# Patient Record
Sex: Female | Born: 1940 | Race: White | Hispanic: No | Marital: Married | Smoking: Former smoker
Health system: Southern US, Community
[De-identification: ages and names within clinical notes are randomized; demographics above are authoritative.]

## PROBLEM LIST (undated history)

## (undated) DIAGNOSIS — I208 Other forms of angina pectoris: Secondary | ICD-10-CM

## (undated) DIAGNOSIS — N183 Chronic kidney disease, stage 3 unspecified: Secondary | ICD-10-CM

## (undated) DIAGNOSIS — E039 Hypothyroidism, unspecified: Secondary | ICD-10-CM

## (undated) DIAGNOSIS — K219 Gastro-esophageal reflux disease without esophagitis: Secondary | ICD-10-CM

## (undated) DIAGNOSIS — I5032 Chronic diastolic (congestive) heart failure: Secondary | ICD-10-CM

## (undated) DIAGNOSIS — G4733 Obstructive sleep apnea (adult) (pediatric): Secondary | ICD-10-CM

## (undated) DIAGNOSIS — G459 Transient cerebral ischemic attack, unspecified: Secondary | ICD-10-CM

## (undated) DIAGNOSIS — E669 Obesity, unspecified: Secondary | ICD-10-CM

## (undated) DIAGNOSIS — I2510 Atherosclerotic heart disease of native coronary artery without angina pectoris: Secondary | ICD-10-CM

## (undated) DIAGNOSIS — M353 Polymyalgia rheumatica: Secondary | ICD-10-CM

## (undated) DIAGNOSIS — I771 Stricture of artery: Secondary | ICD-10-CM

## (undated) DIAGNOSIS — M1990 Unspecified osteoarthritis, unspecified site: Secondary | ICD-10-CM

## (undated) DIAGNOSIS — D649 Anemia, unspecified: Secondary | ICD-10-CM

## (undated) DIAGNOSIS — Z9989 Dependence on other enabling machines and devices: Secondary | ICD-10-CM

## (undated) DIAGNOSIS — Z8719 Personal history of other diseases of the digestive system: Secondary | ICD-10-CM

## (undated) DIAGNOSIS — I2089 Other forms of angina pectoris: Secondary | ICD-10-CM

## (undated) DIAGNOSIS — J189 Pneumonia, unspecified organism: Secondary | ICD-10-CM

## (undated) DIAGNOSIS — E785 Hyperlipidemia, unspecified: Secondary | ICD-10-CM

## (undated) DIAGNOSIS — F329 Major depressive disorder, single episode, unspecified: Secondary | ICD-10-CM

## (undated) DIAGNOSIS — F32A Depression, unspecified: Secondary | ICD-10-CM

## (undated) HISTORY — DX: Transient cerebral ischemic attack, unspecified: G45.9

## (undated) HISTORY — DX: Chronic kidney disease, stage 3 (moderate): N18.3

## (undated) HISTORY — DX: Hyperlipidemia, unspecified: E78.5

## (undated) HISTORY — DX: Major depressive disorder, single episode, unspecified: F32.9

## (undated) HISTORY — PX: DILATION AND CURETTAGE OF UTERUS: SHX78

## (undated) HISTORY — DX: Depression, unspecified: F32.A

## (undated) HISTORY — DX: Chronic kidney disease, stage 3 unspecified: N18.30

## (undated) HISTORY — DX: Chronic diastolic (congestive) heart failure: I50.32

## (undated) HISTORY — DX: Other forms of angina pectoris: I20.8

## (undated) HISTORY — DX: Other forms of angina pectoris: I20.89

## (undated) HISTORY — DX: Dependence on other enabling machines and devices: Z99.89

## (undated) HISTORY — DX: Obesity, unspecified: E66.9

## (undated) HISTORY — PX: FRACTURE SURGERY: SHX138

## (undated) HISTORY — DX: Atherosclerotic heart disease of native coronary artery without angina pectoris: I25.10

## (undated) HISTORY — DX: Obstructive sleep apnea (adult) (pediatric): G47.33

## (undated) HISTORY — DX: Stricture of artery: I77.1

---

## 1962-03-23 HISTORY — PX: BREAST BIOPSY: SHX20

## 1991-03-24 HISTORY — PX: PATELLA FRACTURE SURGERY: SHX735

## 1998-01-29 ENCOUNTER — Encounter: Payer: Self-pay | Admitting: Family Medicine

## 1998-01-29 ENCOUNTER — Ambulatory Visit (HOSPITAL_COMMUNITY): Admission: RE | Admit: 1998-01-29 | Discharge: 1998-01-29 | Payer: Self-pay | Admitting: Family Medicine

## 1998-02-28 ENCOUNTER — Ambulatory Visit (HOSPITAL_COMMUNITY): Admission: RE | Admit: 1998-02-28 | Discharge: 1998-03-01 | Payer: Self-pay | Admitting: Cardiology

## 1998-03-23 HISTORY — PX: CORONARY ARTERY BYPASS GRAFT: SHX141

## 1998-03-26 ENCOUNTER — Encounter (HOSPITAL_COMMUNITY): Admission: RE | Admit: 1998-03-26 | Discharge: 1998-06-24 | Payer: Self-pay | Admitting: Cardiology

## 1998-03-29 ENCOUNTER — Encounter: Payer: Self-pay | Admitting: Emergency Medicine

## 1998-03-29 ENCOUNTER — Emergency Department (HOSPITAL_COMMUNITY): Admission: EM | Admit: 1998-03-29 | Discharge: 1998-03-29 | Payer: Self-pay | Admitting: Emergency Medicine

## 1998-05-14 ENCOUNTER — Ambulatory Visit: Admission: RE | Admit: 1998-05-14 | Discharge: 1998-05-14 | Payer: Self-pay | Admitting: Family Medicine

## 1998-05-22 ENCOUNTER — Encounter: Payer: Self-pay | Admitting: Cardiology

## 1998-05-22 ENCOUNTER — Inpatient Hospital Stay (HOSPITAL_COMMUNITY): Admission: EM | Admit: 1998-05-22 | Discharge: 1998-05-25 | Payer: Self-pay | Admitting: Emergency Medicine

## 1998-06-06 ENCOUNTER — Inpatient Hospital Stay (HOSPITAL_COMMUNITY): Admission: EM | Admit: 1998-06-06 | Discharge: 1998-06-08 | Payer: Self-pay | Admitting: Emergency Medicine

## 1998-06-06 ENCOUNTER — Encounter: Payer: Self-pay | Admitting: Emergency Medicine

## 1998-06-07 ENCOUNTER — Encounter: Payer: Self-pay | Admitting: Cardiology

## 1998-06-24 ENCOUNTER — Encounter: Payer: Self-pay | Admitting: Cardiothoracic Surgery

## 1998-06-26 ENCOUNTER — Inpatient Hospital Stay (HOSPITAL_COMMUNITY): Admission: RE | Admit: 1998-06-26 | Discharge: 1998-07-03 | Payer: Self-pay | Admitting: Cardiothoracic Surgery

## 1998-06-26 ENCOUNTER — Encounter: Payer: Self-pay | Admitting: Cardiothoracic Surgery

## 1998-06-27 ENCOUNTER — Encounter: Payer: Self-pay | Admitting: Cardiothoracic Surgery

## 1998-06-28 ENCOUNTER — Encounter: Payer: Self-pay | Admitting: Cardiology

## 1998-06-29 ENCOUNTER — Encounter: Payer: Self-pay | Admitting: Cardiothoracic Surgery

## 1998-06-30 ENCOUNTER — Encounter: Payer: Self-pay | Admitting: Cardiothoracic Surgery

## 1998-07-01 ENCOUNTER — Encounter: Payer: Self-pay | Admitting: Cardiothoracic Surgery

## 1998-07-23 ENCOUNTER — Encounter (HOSPITAL_COMMUNITY): Admission: RE | Admit: 1998-07-23 | Discharge: 1998-10-21 | Payer: Self-pay | Admitting: Cardiology

## 1998-09-05 ENCOUNTER — Observation Stay (HOSPITAL_COMMUNITY): Admission: AD | Admit: 1998-09-05 | Discharge: 1998-09-06 | Payer: Self-pay | Admitting: Cardiology

## 1998-12-28 ENCOUNTER — Encounter: Payer: Self-pay | Admitting: Emergency Medicine

## 1998-12-28 ENCOUNTER — Inpatient Hospital Stay (HOSPITAL_COMMUNITY): Admission: EM | Admit: 1998-12-28 | Discharge: 1998-12-31 | Payer: Self-pay | Admitting: Emergency Medicine

## 1998-12-30 ENCOUNTER — Encounter: Payer: Self-pay | Admitting: Cardiology

## 1999-03-05 ENCOUNTER — Other Ambulatory Visit: Admission: RE | Admit: 1999-03-05 | Discharge: 1999-03-05 | Payer: Self-pay | Admitting: Obstetrics and Gynecology

## 1999-05-29 ENCOUNTER — Encounter: Payer: Self-pay | Admitting: Emergency Medicine

## 1999-05-29 ENCOUNTER — Observation Stay (HOSPITAL_COMMUNITY): Admission: EM | Admit: 1999-05-29 | Discharge: 1999-05-30 | Payer: Self-pay | Admitting: Emergency Medicine

## 1999-06-17 ENCOUNTER — Inpatient Hospital Stay (HOSPITAL_COMMUNITY): Admission: AD | Admit: 1999-06-17 | Discharge: 1999-06-20 | Payer: Self-pay | Admitting: Cardiology

## 1999-06-17 ENCOUNTER — Encounter: Payer: Self-pay | Admitting: Cardiology

## 1999-12-11 ENCOUNTER — Encounter: Payer: Self-pay | Admitting: Emergency Medicine

## 1999-12-11 ENCOUNTER — Inpatient Hospital Stay (HOSPITAL_COMMUNITY): Admission: EM | Admit: 1999-12-11 | Discharge: 1999-12-12 | Payer: Self-pay | Admitting: Emergency Medicine

## 2000-03-09 ENCOUNTER — Other Ambulatory Visit: Admission: RE | Admit: 2000-03-09 | Discharge: 2000-03-09 | Payer: Self-pay | Admitting: Obstetrics and Gynecology

## 2000-08-27 ENCOUNTER — Other Ambulatory Visit: Admission: RE | Admit: 2000-08-27 | Discharge: 2000-08-27 | Payer: Self-pay | Admitting: Obstetrics and Gynecology

## 2000-08-27 ENCOUNTER — Encounter (INDEPENDENT_AMBULATORY_CARE_PROVIDER_SITE_OTHER): Payer: Self-pay | Admitting: Specialist

## 2001-03-25 ENCOUNTER — Other Ambulatory Visit: Admission: RE | Admit: 2001-03-25 | Discharge: 2001-03-25 | Payer: Self-pay | Admitting: Obstetrics and Gynecology

## 2001-07-18 ENCOUNTER — Encounter: Payer: Self-pay | Admitting: Obstetrics and Gynecology

## 2001-07-18 ENCOUNTER — Encounter: Admission: RE | Admit: 2001-07-18 | Discharge: 2001-07-18 | Payer: Self-pay | Admitting: Obstetrics and Gynecology

## 2001-09-29 ENCOUNTER — Inpatient Hospital Stay (HOSPITAL_COMMUNITY): Admission: EM | Admit: 2001-09-29 | Discharge: 2001-09-30 | Payer: Self-pay | Admitting: Emergency Medicine

## 2001-09-29 ENCOUNTER — Encounter: Payer: Self-pay | Admitting: Emergency Medicine

## 2002-01-06 ENCOUNTER — Encounter: Payer: Self-pay | Admitting: Emergency Medicine

## 2002-01-06 ENCOUNTER — Emergency Department (HOSPITAL_COMMUNITY): Admission: EM | Admit: 2002-01-06 | Discharge: 2002-01-06 | Payer: Self-pay | Admitting: Emergency Medicine

## 2002-05-05 ENCOUNTER — Other Ambulatory Visit: Admission: RE | Admit: 2002-05-05 | Discharge: 2002-05-05 | Payer: Self-pay | Admitting: Obstetrics and Gynecology

## 2002-06-20 ENCOUNTER — Inpatient Hospital Stay (HOSPITAL_COMMUNITY): Admission: EM | Admit: 2002-06-20 | Discharge: 2002-06-22 | Payer: Self-pay | Admitting: Emergency Medicine

## 2002-06-20 ENCOUNTER — Encounter: Payer: Self-pay | Admitting: Family Medicine

## 2002-11-07 ENCOUNTER — Emergency Department (HOSPITAL_COMMUNITY): Admission: EM | Admit: 2002-11-07 | Discharge: 2002-11-08 | Payer: Self-pay | Admitting: Emergency Medicine

## 2002-11-08 ENCOUNTER — Encounter: Payer: Self-pay | Admitting: Emergency Medicine

## 2003-03-13 ENCOUNTER — Encounter (HOSPITAL_COMMUNITY): Admission: RE | Admit: 2003-03-13 | Discharge: 2003-06-11 | Payer: Self-pay | Admitting: Cardiology

## 2003-05-21 ENCOUNTER — Other Ambulatory Visit: Admission: RE | Admit: 2003-05-21 | Discharge: 2003-05-21 | Payer: Self-pay | Admitting: Obstetrics and Gynecology

## 2003-05-29 ENCOUNTER — Encounter: Admission: RE | Admit: 2003-05-29 | Discharge: 2003-08-27 | Payer: Self-pay | Admitting: Family Medicine

## 2003-06-08 ENCOUNTER — Encounter: Admission: RE | Admit: 2003-06-08 | Discharge: 2003-06-08 | Payer: Self-pay | Admitting: Obstetrics and Gynecology

## 2003-06-23 ENCOUNTER — Encounter (HOSPITAL_COMMUNITY): Admission: RE | Admit: 2003-06-23 | Discharge: 2003-09-21 | Payer: Self-pay | Admitting: Cardiology

## 2003-09-25 ENCOUNTER — Encounter (HOSPITAL_COMMUNITY): Admission: RE | Admit: 2003-09-25 | Discharge: 2003-12-24 | Payer: Self-pay | Admitting: Cardiology

## 2005-06-10 ENCOUNTER — Inpatient Hospital Stay (HOSPITAL_COMMUNITY): Admission: RE | Admit: 2005-06-10 | Discharge: 2005-06-13 | Payer: Self-pay | Admitting: Internal Medicine

## 2006-03-23 HISTORY — PX: CARDIAC CATHETERIZATION: SHX172

## 2006-04-09 ENCOUNTER — Inpatient Hospital Stay (HOSPITAL_COMMUNITY): Admission: EM | Admit: 2006-04-09 | Discharge: 2006-04-11 | Payer: Self-pay | Admitting: Emergency Medicine

## 2009-03-23 HISTORY — PX: CATARACT EXTRACTION W/ INTRAOCULAR LENS  IMPLANT, BILATERAL: SHX1307

## 2009-08-02 ENCOUNTER — Ambulatory Visit: Payer: Self-pay | Admitting: Internal Medicine

## 2009-08-06 LAB — CBC WITH DIFFERENTIAL/PLATELET
BASO%: 0.2 % (ref 0.0–2.0)
Basophils Absolute: 0 10*3/uL (ref 0.0–0.1)
EOS%: 3.5 % (ref 0.0–7.0)
Eosinophils Absolute: 0.4 10*3/uL (ref 0.0–0.5)
HCT: 35.8 % (ref 34.8–46.6)
HGB: 12.3 g/dL (ref 11.6–15.9)
LYMPH%: 21.7 % (ref 14.0–49.7)
MCH: 32.2 pg (ref 25.1–34.0)
MCHC: 34.5 g/dL (ref 31.5–36.0)
MCV: 93.3 fL (ref 79.5–101.0)
MONO#: 0.5 10*3/uL (ref 0.1–0.9)
MONO%: 4.4 % (ref 0.0–14.0)
NEUT#: 8 10*3/uL — ABNORMAL HIGH (ref 1.5–6.5)
NEUT%: 70.2 % (ref 38.4–76.8)
Platelets: 323 10*3/uL (ref 145–400)
RBC: 3.84 10*6/uL (ref 3.70–5.45)
RDW: 13.2 % (ref 11.2–14.5)
WBC: 11.3 10*3/uL — ABNORMAL HIGH (ref 3.9–10.3)
lymph#: 2.5 10*3/uL (ref 0.9–3.3)

## 2009-08-09 LAB — COMPREHENSIVE METABOLIC PANEL
ALT: 14 U/L (ref 0–35)
AST: 17 U/L (ref 0–37)
Albumin: 4.2 g/dL (ref 3.5–5.2)
Alkaline Phosphatase: 75 U/L (ref 39–117)
BUN: 19 mg/dL (ref 6–23)
CO2: 23 mEq/L (ref 19–32)
Calcium: 9.4 mg/dL (ref 8.4–10.5)
Chloride: 104 mEq/L (ref 96–112)
Creatinine, Ser: 0.96 mg/dL (ref 0.40–1.20)
Glucose, Bld: 123 mg/dL — ABNORMAL HIGH (ref 70–99)
Potassium: 4 mEq/L (ref 3.5–5.3)
Sodium: 140 mEq/L (ref 135–145)
Total Bilirubin: 0.4 mg/dL (ref 0.3–1.2)
Total Protein: 7.4 g/dL (ref 6.0–8.3)

## 2009-08-09 LAB — LACTATE DEHYDROGENASE: LDH: 128 U/L (ref 94–250)

## 2009-08-09 LAB — LEUKOCYTE ALKALINE PHOS: Leukocyte Alkaline  Phos Stain: 188 — ABNORMAL HIGH (ref 33–149)

## 2009-10-03 ENCOUNTER — Ambulatory Visit: Payer: Self-pay | Admitting: Internal Medicine

## 2009-10-07 LAB — CBC WITH DIFFERENTIAL/PLATELET
BASO%: 0.3 % (ref 0.0–2.0)
Basophils Absolute: 0 10*3/uL (ref 0.0–0.1)
EOS%: 2.4 % (ref 0.0–7.0)
Eosinophils Absolute: 0.3 10*3/uL (ref 0.0–0.5)
HCT: 33.4 % — ABNORMAL LOW (ref 34.8–46.6)
HGB: 11.6 g/dL (ref 11.6–15.9)
LYMPH%: 21.6 % (ref 14.0–49.7)
MCH: 32 pg (ref 25.1–34.0)
MCHC: 34.6 g/dL (ref 31.5–36.0)
MCV: 92.5 fL (ref 79.5–101.0)
MONO#: 0.7 10*3/uL (ref 0.1–0.9)
MONO%: 5.7 % (ref 0.0–14.0)
NEUT#: 8.4 10*3/uL — ABNORMAL HIGH (ref 1.5–6.5)
NEUT%: 70 % (ref 38.4–76.8)
Platelets: 326 10*3/uL (ref 145–400)
RBC: 3.61 10*6/uL — ABNORMAL LOW (ref 3.70–5.45)
RDW: 13.1 % (ref 11.2–14.5)
WBC: 12 10*3/uL — ABNORMAL HIGH (ref 3.9–10.3)
lymph#: 2.6 10*3/uL (ref 0.9–3.3)

## 2009-10-07 LAB — LACTATE DEHYDROGENASE: LDH: 119 U/L (ref 94–250)

## 2009-11-13 ENCOUNTER — Encounter: Admission: RE | Admit: 2009-11-13 | Discharge: 2009-11-13 | Payer: Self-pay | Admitting: Obstetrics and Gynecology

## 2010-01-06 ENCOUNTER — Ambulatory Visit: Payer: Self-pay | Admitting: Internal Medicine

## 2010-01-08 LAB — CBC WITH DIFFERENTIAL/PLATELET
BASO%: 0.3 % (ref 0.0–2.0)
Basophils Absolute: 0 10*3/uL (ref 0.0–0.1)
EOS%: 2.5 % (ref 0.0–7.0)
Eosinophils Absolute: 0.3 10*3/uL (ref 0.0–0.5)
HCT: 34.1 % — ABNORMAL LOW (ref 34.8–46.6)
HGB: 11.8 g/dL (ref 11.6–15.9)
LYMPH%: 20.7 % (ref 14.0–49.7)
MCH: 31.8 pg (ref 25.1–34.0)
MCHC: 34.7 g/dL (ref 31.5–36.0)
MCV: 91.6 fL (ref 79.5–101.0)
MONO#: 0.7 10*3/uL (ref 0.1–0.9)
MONO%: 6.5 % (ref 0.0–14.0)
NEUT#: 7.3 10*3/uL — ABNORMAL HIGH (ref 1.5–6.5)
NEUT%: 70 % (ref 38.4–76.8)
Platelets: 314 10*3/uL (ref 145–400)
RBC: 3.73 10*6/uL (ref 3.70–5.45)
RDW: 13.5 % (ref 11.2–14.5)
WBC: 10.4 10*3/uL — ABNORMAL HIGH (ref 3.9–10.3)
lymph#: 2.1 10*3/uL (ref 0.9–3.3)

## 2010-01-08 LAB — LACTATE DEHYDROGENASE: LDH: 114 U/L (ref 94–250)

## 2010-08-08 NOTE — Discharge Summary (Signed)
NAMEGENECIS, VELEY              ACCOUNT NO.:  [id number]   MEDICAL RECORD NO.:  [id number]          PATIENT TYPE:  INP   LOCATION:  3711                         FACILITY:  [facility]   PHYSICIAN:  Corky Crafts, MDDATE OF BIRTH:  Jul 25, 1940   DATE OF ADMISSION:  04/09/2006  DATE OF DISCHARGE:  04/11/2006                               DISCHARGE SUMMARY   DISCHARGE DIAGNOSES:  1. Chest pain, resolved.  2. Known coronary artery disease with Cardiolite being performed on      April 11, 2006 showing ejection fraction of 73% with no inducible      left ventricular ischemia or scarring.  3. Fever.  4. Hyperlipidemia.  5. Seventy percent left subclavian stenosis.  6. Hiatal hernia.  7. Gastroesophageal reflux disease.  8. Depression.  9. Long-term medication use.  10.Family history of coronary artery disease.   Ms. Fickling is a 70 year old female with a known history of coronary  artery disease.  She underwent bypass grafting in 2000 with LIMA  to the  LAD and diagonal, saphenous vein grafts to the OM1, saphenous vein graft  to the PDA.  She has had multiple PTCA's to the second diagonal as well  as two EECP cycles.  She admits to chronic angina, but as of the last  month she has noticed increased chest pain with Tai Chi.  This is  associated with fatigue and dyspnea.  She was admitted to the hospital,  and laboratory study showed a white count of 11.8, hemoglobin 11.5, and  hematocrit 33.6, platelets 303; sodium 136, potassium 4.5.  Cardiac  isoenzymes were negative.  Total cholesterol 122, triglycerides 208, LDL  41, HDL 39, TSH 4.272.   She remained in the hospital until May 12, 2006 and underwent a  Cardiolite study that was normal as stated above.  We started Ranexa,  and she seemed to feel better.  She was then discharged to home in  stable condition on the following medications:  1. Imdur 120 mg a day.  2. Ranexa 500 mg twice a day.  3. Enteric-coated aspirin 325  mg a day.  4. Toprol XL 100 mg a day.  5. Vytorin 10/40 one tablet daily.  6. Sublingual nitroglycerin p.r.n. chest pain.  7. Allegra daily as needed.  8. Ditropan 10 mg daily.  9. Effexor 150 mg a day.  10.Flonase p.r.n.  11.Diprolene daily.  12.Fluocinonide daily.  13.Multivitamin daily.  14.Fish oil daily.  15.Mobic daily.  16.Calcium daily.  17.Coenzyme Q10 daily.  18.Pepcid.   Followup with Dr. Amil Amen in a few weeks.  She is to call for this  appointment.  Remain on a low-sodium, heart-healthy diet.  Increase  activity slowly.      Guy Franco, P.A.      Corky Crafts, MD  Electronically Signed    LB/MEDQ  D:  05/17/2006  T:  05/17/2006  Job:  [id number]   cc:   Chales Salmon. Abigail Miyamoto, M.D.  Francisca December, M.D.

## 2010-08-08 NOTE — Cardiovascular Report (Signed)
NAME:  Erin Ingram, Erin Ingram                        ACCOUNT NO.:  [id number]   MEDICAL RECORD NO.:  [id number]                   PATIENT TYPE:  INP   LOCATION:  6532                                 FACILITY:  [facility]   PHYSICIAN:  Lyn Records III, M.D.            DATE OF BIRTH:  October 19, 1940   DATE OF PROCEDURE:  06/21/2002  DATE OF DISCHARGE:                              CARDIAC CATHETERIZATION   REASON FOR STUDY:  Prolonged and recurrent episodes of dyspnea on exertion  and mild chest discomfort in this patient with a history of coronary artery  disease who has undergone coronary artery bypass grafting in 2000, and prior  to that, multiple PCI procedures by Dr. Ty Hilts.   PROCEDURE PERFORMED:  1. Left heart catheterization.  2. Selective coronary angiogram.  3. Left ventriculography.  4. Bypass graft angiogram.   DESCRIPTION OF PROCEDURE:  After informed consent, a 6-French sheath was  placed in the right femoral artery using the modified Seldinger technique.  A 6-French AT multipurpose catheter was used for hemodynamic recordings.  Left ventriculography by hand injection, selective left, right and saphenous  vein graft angiography.  The patient tolerated the procedure without  complications.   A total of 105 cc of contrast was used.   RESULTS:   I. HEMODYNAMIC DATA:  A.  Aortic pressure 108/57.  B.  Left ventricular pressure 108/16.   II. LEFT VENTRICULOGRAPHY:  Overall normal function, EF of 60%.   III. CORONARY ANGIOGRAPHY:  A.  Left main coronary artery:  Patent.  B.  Left anterior descending coronary artery:  The LAD is widely patent, but  in the mid and distal vessel, there is segmental high-grade stenosis of up  to 70%.  There is competition with the free LIMA to the distal LAD for  antegrade flow.  A large first diagonal arises and is free of significant  obstruction.  The second diagonal is totally occluded.  C.  Circumflex artery:  The circumflex contains proximal/  mid stents.  There  is severe, diffuse stenosis within this region, both in-stent and pre and  post stent margins.  Despite this, there is antegrade flow into the first  and second obtuse marginal branches, and there is competition for flow with  the saphenous vein graft that supplies the first obtuse marginal.  The  connection between the first marginal and second marginal is free of any  significant obstructive lesions.  D.  Right coronary:  The right coronary artery is diffusely diseased in the  proximal and mid segment, but patent and competes with flow, with a patent  saphenous vein graft to the PDA.   BYPASS GRAFT ANGIOGRAPHY:  A.  Saphenous vein graft to the right coronary:  Luminal irregularities but widely patent into the PDA with antegrade and  retrograde reflux throughout the entire right coronary system.  B.  Saphenous vein graft to the circumflex obtuse marginal #1:  Widely  patent with antegrade and retrograde flow into the entire circumflex system  with reflux of contrast into the LAD and left main.  C.  Saphenous vein/ free LIMA Y graft to the diagonal and LAD:  This graft  is widely patent.  The LIMA Y graft to the distal LAD is somewhat atretic  appearing, but there is free brisk flow into the distal LAD.   CONCLUSIONS:  1. Patent saphenous vein grafts and free  left internal mammary artery  graft as outlined above.  1. Severe native vessel coronary disease with diffuse segmental mid left     anterior descending, diffuse mid right coronary artery and high-grade     diffuse proximal and mid circumflex disease.  2. Normal left ventricular function.  3. Unchanged from anatomy of catheterization 06/19/99.   RECOMMENDATIONS:  1. Consider other cause for the patient's chest pain.  2. Consider CT scan.  3. Check BMET in the a.m. as the patient was mildly pre-renal prior to this     procedure, but has received some IV fluid.                                                Lesleigh Noe, M.D.    HWS/MEDQ  D:  06/21/2002  T:  06/21/2002  Job:  [id number]   cc:   Chales Salmon. Abigail Miyamoto, M.D.  [address]  [place]  [place] [address]  Fax: [phone]

## 2010-08-08 NOTE — H&P (Signed)
Erin Ingram, Erin Ingram              ACCOUNT NO.:  [id number]   MEDICAL RECORD NO.:  [id number]          PATIENT TYPE:  INP   LOCATION:  6732                         FACILITY:  [facility]   PHYSICIAN:  Kela Millin, M.D.DATE OF BIRTH:  02-04-1941   DATE OF ADMISSION:  06/10/2005  DATE OF DISCHARGE:                                HISTORY & PHYSICAL   PRIMARY CARE PHYSICIAN:  Dr. Abigail Miyamoto   CHIEF COMPLAINT:  Worsening shortness of breath.   HISTORY OF PRESENT ILLNESS:  The patient is a 70 year old white female with  past medical history significant for coronary artery disease, GERD,  dyslipidemia, depression, who presents with complaints of worsening  shortness of breath x3 days.  She states that she was in her usual state of health until about a week ago  when she started having URI symptoms - nasal congestion, post nasal drip,  and she finally went to the clinic about 3 days ago at which she was  diagnosed with sinusitis and started on Augmentin. After starting on  Augmentin she states that she began feeling short of breath and that  gradually worsened. She also developed a nonproductive cough. She denies  fevers, paroxysmal nocturnal dyspnea, orthopnea, chest pain, dysuria, leg  swelling, diarrhea, hematochezia, nausea, vomiting, melena, and no  hematemesis. Because her shortness of breath was worsening she went to her  primary care physician's office today at which time a chest x-ray was done  and she was told that she had a pneumonia and directly admitted to the [facility] service for further evaluation and management.   PAST MEDICAL HISTORY:  1.  As stated above.  2.  Depression.   MEDICATIONS:  1.  Imdur 120 mg q.a.m.  2.  Toprol XL 100 mg q.h.s.  3.  Ecotrin 325 mg q.h.s.  4.  Demadex 5 mg p.o. daily.  5.  Vytorin 10/40 mg p.o. q.h.s.  6.  Nitroglycerin p.r.n.  7.  Allegra 180 mg daily.  8.  Ditropan XL 15 mg q.h.s.  9.  Effexor 150 mg q.h.s.  10. Aciphex 20  mg daily.  11. Flonase 2 sprays q.a.m.  12. Actonel 35 mg once a week.   ALLERGIES/INTOLERANCES:  SEPTRA, DEMEROL, IMIPRAMINE, TEQUIN, CODEINE,  SUDAFED, ZEBETA.   SOCIAL HISTORY:  She quit tobacco 36 years ago, occasional alcohol.   FAMILY HISTORY:  Positive for heart disease.   REVIEW OF SYSTEMS:  As per HPI. Other review of systems negative.   PHYSICAL EXAMINATION:  GENERAL:  The patient is an elderly white female,  obese, in no respiratory distress with nasal cannula O2 on.  VITAL SIGNS:  Temperature is 98.2 with a pulse of 102, respiratory rate 20,  blood pressure 109/67.  HEENT:  PERRL, EOMI, sclerae anicteric, slightly dry mucous membranes, no  oral exudates.  NECK:  Soft, no adenopathy, no thyromegaly and no JVD appreciated.  LUNGS:  Rhonchi present bilaterally, no wheezes.  CARDIOVASCULAR:  Regular rate and rhythm, normal S1 and S2. No S3  appreciated.  ABDOMEN:  Obese, bowel sounds present, nontender, nondistended, no  organomegaly and no mass is  palpable.  EXTREMITIES:  No cyanosis and no edema.  NEUROLOGIC:  Alert and oriented x3. Cranial nerves II-XII grossly intact,  nonfocal exam.   LABORATORY DATA:  White cell count 13.1 thousand, hemoglobin 10.9,  hematocrit 31.9, platelet count 219,000. Sodium 142, potassium 4.4, chloride  108, CO2 is 27, BUN is 12, creatinine 1.1 and glucose is 137. Liver function  tests within normal limits. Cardiac enzymes negative.   ASSESSMENT/PLAN:  PROBLEM #1. Probable pneumonia - 70 year old presenting  with cough, shortness of breath, and per  her primary M.D., a chest x-ray  consistent with pneumonia. Will follow and review chest x-ray. Start empiric  antibiotics and expectorant.  PROBLEM #2. Recent sinusitis - will continue antihistamine, antibiotics as  above.  PROBLEM #3. Coronary artery disease - patient chest pain free at this time.  Cardiac enzymes negative. Will continue beta blockers and aspirin.  PROBLEM #4. Dyslipidemia  - continue Vytorin.  PROBLEM #5. Gastroesophageal reflux disease - continue proton pump  inhibitor.  PROBLEM #6. Depression - continue Effexor.      Kela Millin, M.D.  Electronically Signed     ACV/MEDQ  D:  06/10/2005  T:  06/10/2005  Job:  [id number]   cc:   Chales Salmon. Abigail Miyamoto, M.D.  Fax: [phone]

## 2013-01-02 ENCOUNTER — Ambulatory Visit: Payer: Self-pay | Admitting: Family Medicine

## 2013-01-10 ENCOUNTER — Ambulatory Visit (INDEPENDENT_AMBULATORY_CARE_PROVIDER_SITE_OTHER): Payer: Self-pay | Admitting: Family Medicine

## 2013-01-10 DIAGNOSIS — Z713 Dietary counseling and surveillance: Secondary | ICD-10-CM

## 2013-01-20 ENCOUNTER — Encounter: Payer: Self-pay | Admitting: Interventional Cardiology

## 2013-03-22 ENCOUNTER — Encounter: Payer: Self-pay | Admitting: Cardiology

## 2013-03-22 DIAGNOSIS — R7301 Impaired fasting glucose: Secondary | ICD-10-CM

## 2013-03-22 DIAGNOSIS — G4730 Sleep apnea, unspecified: Secondary | ICD-10-CM

## 2013-03-22 DIAGNOSIS — F329 Major depressive disorder, single episode, unspecified: Secondary | ICD-10-CM

## 2013-03-22 DIAGNOSIS — F339 Major depressive disorder, recurrent, unspecified: Secondary | ICD-10-CM | POA: Insufficient documentation

## 2013-03-22 DIAGNOSIS — Z7189 Other specified counseling: Secondary | ICD-10-CM | POA: Insufficient documentation

## 2013-03-22 DIAGNOSIS — E782 Mixed hyperlipidemia: Secondary | ICD-10-CM

## 2013-03-22 DIAGNOSIS — I209 Angina pectoris, unspecified: Secondary | ICD-10-CM

## 2013-03-22 DIAGNOSIS — D519 Vitamin B12 deficiency anemia, unspecified: Secondary | ICD-10-CM | POA: Insufficient documentation

## 2013-03-22 DIAGNOSIS — F3289 Other specified depressive episodes: Secondary | ICD-10-CM

## 2013-03-22 DIAGNOSIS — D649 Anemia, unspecified: Secondary | ICD-10-CM

## 2013-03-22 DIAGNOSIS — M159 Polyosteoarthritis, unspecified: Secondary | ICD-10-CM

## 2013-03-22 DIAGNOSIS — I2510 Atherosclerotic heart disease of native coronary artery without angina pectoris: Secondary | ICD-10-CM

## 2013-03-22 DIAGNOSIS — Z951 Presence of aortocoronary bypass graft: Secondary | ICD-10-CM

## 2013-03-22 DIAGNOSIS — N318 Other neuromuscular dysfunction of bladder: Secondary | ICD-10-CM | POA: Insufficient documentation

## 2013-03-22 DIAGNOSIS — Z79899 Other long term (current) drug therapy: Secondary | ICD-10-CM

## 2013-04-04 ENCOUNTER — Ambulatory Visit: Payer: Self-pay | Admitting: Interventional Cardiology

## 2013-04-06 ENCOUNTER — Ambulatory Visit: Payer: Self-pay | Admitting: Interventional Cardiology

## 2013-04-07 ENCOUNTER — Ambulatory Visit (INDEPENDENT_AMBULATORY_CARE_PROVIDER_SITE_OTHER): Payer: Medicare Other | Admitting: Interventional Cardiology

## 2013-04-07 ENCOUNTER — Encounter: Payer: Self-pay | Admitting: Interventional Cardiology

## 2013-04-07 ENCOUNTER — Encounter (INDEPENDENT_AMBULATORY_CARE_PROVIDER_SITE_OTHER): Payer: Self-pay

## 2013-04-07 VITALS — BP 104/52 | HR 65 | Ht <= 58 in | Wt 205.0 lb

## 2013-04-07 DIAGNOSIS — Z951 Presence of aortocoronary bypass graft: Secondary | ICD-10-CM

## 2013-04-07 DIAGNOSIS — I2510 Atherosclerotic heart disease of native coronary artery without angina pectoris: Secondary | ICD-10-CM

## 2013-04-07 DIAGNOSIS — E782 Mixed hyperlipidemia: Secondary | ICD-10-CM

## 2013-04-07 NOTE — Progress Notes (Signed)
Patient ID: Erin Ingram, female   DOB: [birth year], 72 y.o.   MRN: [id number]    Erin Ingram, [place], [place]  [address] Phone: [phone] Fax:  [phone]  Date:  04/07/2013   ID:  Erin Ingram, Erin Ingram 25-Oct-1940, MRN [id number]  PCP:  [name], MD      History of Present Illness: Erin Ingram is a 73 y.o. female who has had CAD. She had CABG in 2000.  Repeat cath in 2008 showed patent grafts.  She had persistent angina treated with EECP. She was started on Ranexa on the hospital. She also had amlodipine added. Her CP has been significnatly improved. She no longer exercises in the pool. She does Tai Chi. She feels that she has some mild angina since her last visit.  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   CAD/ASCVD:  c/o Leg edema mild, based on diet.  c/o Nitroglycerin once every 2-4 months.  Denies : Chest pain.  Dizziness.  Dyspnea on exertion.  Fatigue.  Palpitations.  Syncope.     Wt Readings from Last 3 Encounters:  04/07/13 205 lb (92.987 kg)     Past Medical History  Diagnosis Date  . Coronary artery disease   . Hyperlipidemia   . OSA on CPAP   . Depression     with anxious component  . Obesity   . Stable angina     microvascular, improved with Ranexa    Current Outpatient Prescriptions  Medication Sig Dispense Refill  . amLODipine (NORVASC) 5 MG tablet Take 5 mg by mouth daily.      Marland Kitchen aspirin 325 MG tablet Take 325 mg by mouth daily.      . Cholecalciferol (VITAMIN D3) 5000 UNITS CAPS Take by mouth daily.      . Coenzyme Q10 10 MG capsule Take 10 mg by mouth daily.      . fluocinonide cream (LIDEX) 2.70 % Apply 1 application topically 2 (two) times daily.      . fluticasone (FLONASE) 50 MCG/ACT nasal spray Place 2 sprays into both nostrils daily.      . isosorbide mononitrate (IMDUR) 60 MG 24 hr tablet Take 120 mg by mouth daily.      Marland Kitchen levocetirizine (XYZAL) 5 MG tablet Take 5 mg by mouth every evening.       . metoprolol succinate (TOPROL-XL) 100 MG 24 hr tablet Take 100 mg by mouth daily. Take with or immediately following a meal.      . nitroGLYCERIN (NITROSTAT) 0.4 MG SL tablet Place 0.4 mg under the tongue every 5 (five) minutes as needed for chest pain.      Marland Kitchen oxybutynin (DITROPAN-XL) 10 MG 24 hr tablet Take 10 mg by mouth at bedtime.      . predniSONE (DELTASONE) 5 MG tablet Take 5 mg by mouth daily with breakfast.      . ranolazine (RANEXA) 1000 MG SR tablet Take 1,000 mg by mouth 2 (two) times daily.      . Selenium Sulfide 2.25 % FOAM Apply topically.      Marland Kitchen thyroid (ARMOUR) 15 MG tablet Take 15 mg by mouth. 3 tablet daily      . triamcinolone lotion (KENALOG) 0.1 % Apply 1 application topically 3 (three) times daily.      Marland Kitchen venlafaxine (EFFEXOR) 75 MG tablet Take 75 mg by mouth 3 (three) times daily with meals.       No current  facility-administered medications for this visit.    Allergies:    Allergies  Allergen Reactions  . Bisoprolol   . Codeine   . Demerol [Meperidine]   . Imipramine   . Septra [Sulfamethoxazole-Tmp Ds]   . Stadol [Butorphanol]   . Statins     MYALGIAS  . Tequin [Gatifloxacin]     Social History:  The patient  reports that she has quit smoking. She does not have any smokeless tobacco history on file. She reports that she drinks alcohol. She reports that she does not use illicit drugs.   Family History:  The patient's family history includes Diabetes in her brother; Heart disease in her father and mother.   ROS:  Please see the history of present illness.  No nausea, vomiting.  No fevers, chills.  No focal weakness.  No dysuria.    All other systems reviewed and negative.   PHYSICAL EXAM: VS:  BP 104/52  Pulse 65  Ht $R'4\' 10"'ir$  (1.473 m)  Wt 205 lb (92.987 kg)  BMI 42.86 kg/m2 Well nourished, well developed, in no acute distress HEENT: normal Neck: no JVD, no carotid bruits Cardiac:  normal S1, S2; RRR;  Lungs:  clear to auscultation bilaterally,  no wheezing, rhonchi or rales Abd: soft, nontender, no hepatomegaly Ext: no edema Skin: warm and dry Neuro:   no focal abnormalities noted  EKG:  NSR, TWI in leads I and AVL; no change from prior    ASSESSMENT AND PLAN:  Coronary atherosclerosis of native coronary artery  Continue Aspirin 325 mg tablet, 325 mg, 1 tablet, orally, once a day Continue Metoprolol Succinate Tablet Extended Release 24 Hour, 100 MG, 1 tablet, Orally, once a day Continue Isosorbide Mononitrate Tablet Extended Release 24 Hour, 60 MG, 2 tablet, Orally, Once a day Mild angina, very rarely using NTG. Continue medical therapy. We discussed evaluation in the past with stress or cath, but she does not feel her sx are bad enough for any evaluation at this time.    2. Hypercholesteremia  Continue Fish Oil Capsule, 820 mg, 4 capsules, Orally, once a day LDL 93 while on lipitor. She saw something on the news that was negative about lipitor so she stopped it. She is back on atorvastatin. HDL 58, LDL 82 in 5/14.  Lipids to be checked by PMD.   3. Obesity, unspecified  We spoke about the importance of weight loss.  Gained 10 lbs since last year.  She is now on steroids which may make weight loss harder.   Walking harder due to PMR.  She has used a walker recently due to the PMR. Parking pass filled out.      Preventive Medicine  Adult topics discussed:  Diet: healthy diet, low calorie, low fat.  Exercise: at least 30 minutes of aerobic exercise, 5 days a week.    Follow Up     Signed, Mina Marble, MD, [facility] 04/07/2013 1:52 PM

## 2013-04-07 NOTE — Patient Instructions (Signed)
Your physician wants you to follow-up in: 1 year with Dr. Irish Lack. You will receive a reminder letter in the mail two months in advance. If you don't receive a letter, please call our office to schedule the follow-up appointment.  Your physician recommends that you continue on your current medications as directed. Please refer to the Current Medication list given to you today.

## 2013-04-29 ENCOUNTER — Other Ambulatory Visit: Payer: Self-pay | Admitting: Interventional Cardiology

## 2013-05-01 NOTE — Telephone Encounter (Signed)
Should this patient be taking amlodipine 2.5mg  or 5mg ? Please advise. Thanks, MI

## 2013-05-02 NOTE — Telephone Encounter (Signed)
lmtrc

## 2013-05-03 ENCOUNTER — Other Ambulatory Visit: Payer: Self-pay | Admitting: Interventional Cardiology

## 2013-05-03 ENCOUNTER — Other Ambulatory Visit: Payer: Self-pay | Admitting: *Deleted

## 2013-05-03 MED ORDER — AMLODIPINE BESYLATE 2.5 MG PO TABS: 2.5000 mg | ORAL_TABLET | Freq: Every day | ORAL | Status: DC

## 2013-05-03 NOTE — Telephone Encounter (Signed)
Patient returned Amy's call, she stated that she takes amlodipine 2.5 mg. I will send refill to pharmacy.

## 2013-05-08 NOTE — Telephone Encounter (Signed)
Pt has been taking amlodipine 2.5 mg for quite sometime. Will refill.

## 2013-05-19 ENCOUNTER — Other Ambulatory Visit: Payer: Self-pay | Admitting: Interventional Cardiology

## 2013-05-24 ENCOUNTER — Other Ambulatory Visit: Payer: Self-pay

## 2013-05-24 MED ORDER — RANOLAZINE ER 1000 MG PO TB12: ORAL_TABLET | ORAL | Status: DC

## 2013-06-02 ENCOUNTER — Encounter: Payer: Self-pay | Admitting: Interventional Cardiology

## 2013-06-05 ENCOUNTER — Other Ambulatory Visit: Payer: Self-pay | Admitting: Family Medicine

## 2013-06-05 DIAGNOSIS — IMO0002 Reserved for concepts with insufficient information to code with codable children: Secondary | ICD-10-CM

## 2013-06-23 ENCOUNTER — Ambulatory Visit
Admission: RE | Admit: 2013-06-23 | Discharge: 2013-06-23 | Disposition: A | Discharge status: Home / Self Care | Payer: Medicare Other | Source: Ambulatory Visit | Attending: Family Medicine | Admitting: Family Medicine

## 2013-06-23 DIAGNOSIS — IMO0002 Reserved for concepts with insufficient information to code with codable children: Secondary | ICD-10-CM

## 2013-08-31 ENCOUNTER — Telehealth: Payer: Self-pay | Admitting: Interventional Cardiology

## 2013-08-31 NOTE — Telephone Encounter (Signed)
The amlodipine is treatment for her angina and appears to have been effective. OK to have the low dose ARB, but would not stop amlodipine.

## 2013-08-31 NOTE — Telephone Encounter (Signed)
To Dr. Varanasi, please advise.  

## 2013-08-31 NOTE — Telephone Encounter (Signed)
Pt notified. Faxed to Dr. Rex Kras.

## 2013-08-31 NOTE — Telephone Encounter (Signed)
New message     PCP want her to stop amlodipine and give her losartin because of kidney problems---high creatinine.  What do Dr Irish Lack think?  The amlodipine is for her angina not for bp.

## 2013-10-12 ENCOUNTER — Ambulatory Visit: Payer: Medicare Other | Admitting: Family Medicine

## 2013-11-29 ENCOUNTER — Other Ambulatory Visit: Payer: Self-pay | Admitting: Interventional Cardiology

## 2014-01-03 ENCOUNTER — Other Ambulatory Visit: Payer: Self-pay | Admitting: Interventional Cardiology

## 2014-01-04 ENCOUNTER — Other Ambulatory Visit: Payer: Self-pay

## 2014-01-04 MED ORDER — METOPROLOL SUCCINATE ER 100 MG PO TB24: 100.0000 mg | ORAL_TABLET | Freq: Every day | ORAL | Status: DC

## 2014-03-18 ENCOUNTER — Other Ambulatory Visit: Payer: Self-pay | Admitting: Interventional Cardiology

## 2014-03-22 ENCOUNTER — Encounter: Payer: Self-pay | Admitting: Interventional Cardiology

## 2014-04-11 ENCOUNTER — Ambulatory Visit (INDEPENDENT_AMBULATORY_CARE_PROVIDER_SITE_OTHER): Payer: Medicare Other | Admitting: Interventional Cardiology

## 2014-04-11 ENCOUNTER — Encounter: Payer: Self-pay | Admitting: Interventional Cardiology

## 2014-04-11 VITALS — BP 124/62 | HR 63 | Ht <= 58 in | Wt 213.0 lb

## 2014-04-11 DIAGNOSIS — I10 Essential (primary) hypertension: Secondary | ICD-10-CM | POA: Insufficient documentation

## 2014-04-11 DIAGNOSIS — I2510 Atherosclerotic heart disease of native coronary artery without angina pectoris: Secondary | ICD-10-CM

## 2014-04-11 DIAGNOSIS — E782 Mixed hyperlipidemia: Secondary | ICD-10-CM

## 2014-04-11 NOTE — Patient Instructions (Addendum)
Your physician recommends that you continue on your current medications as directed. Please refer to the Current Medication list given to you today.  Your physician wants you to follow-up in: 1 year with Dr. Varanasi. You will receive a reminder letter in the mail two months in advance. If you don't receive a letter, please call our office to schedule the follow-up appointment.  

## 2014-04-11 NOTE — Progress Notes (Signed)
Patient ID: Erin Ingram, female   DOB: 02/17/1941, 74 y.o.   MRN: [id number]    [address], [address] [place], [place]  [address] Phone: [phone] Fax:  [phone]  Date:  04/11/2014   ID:  Erin Ingram, DOB 07/20/1940, MRN [id number]  PCP:  LITTLE,KEVIN LORNE, MD      History of Present Illness: Erin Ingram is a 74 y.o. female who has had CAD. She had CABG in 2000.  Repeat cath in 2008 showed patent grafts.  She had persistent angina treated with EECP many years ago. She was started on Ranexa on the hospital. She also had amlodipine added. Her CP has been significantly improved. She no longer exercises in the pool. She does Tai Chi. She feels that she has less mild angina since her last visit.  Some DOE but CP is better.  Now on steroids for PMR.   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   CAD/ASCVD:  c/o Leg edema mild, based on diet.  c/o Nitroglycerin once every 2-4 months. No usage in the last few months Denies : Chest pain.  Dizziness.  Dyspnea on exertion.  Fatigue.  Palpitations.  Syncope.     Wt Readings from Last 3 Encounters:  04/11/14 213 lb (96.616 kg)  04/07/13 205 lb (92.987 kg)     Past Medical History  Diagnosis Date  . Coronary artery disease   . Hyperlipidemia   . OSA on CPAP   . Depression     with anxious component  . Obesity   . Stable angina     microvascular, improved with Ranexa    Current Outpatient Prescriptions  Medication Sig Dispense Refill  . amLODipine (NORVASC) 2.5 MG tablet Take 1 tablet (2.5 mg total) by mouth daily. 30 tablet 5  . aspirin 325 MG tablet Take 325 mg by mouth daily.    . Cholecalciferol (VITAMIN D3) 5000 UNITS CAPS Take by mouth daily.    . Coenzyme Q10 10 MG capsule Take 10 mg by mouth daily.    . fluocinonide cream (LIDEX) 0.05 % Apply 1 application topically 2 (two) times daily.    . fluticasone (FLONASE) 50 MCG/ACT nasal spray Place 2 sprays into both nostrils daily.    .  isosorbide mononitrate (IMDUR) 60 MG 24 hr tablet Take 2 tablets by mouth  once a day per Dr Varanasi 180 tablet 3  . levocetirizine (XYZAL) 5 MG tablet Take 5 mg by mouth every evening.    . levothyroxine (SYNTHROID, LEVOTHROID) 75 MCG tablet Take 75 mcg by mouth daily before breakfast.    . metoprolol succinate (TOPROL-XL) 100 MG 24 hr tablet Take 1 tablet (100 mg total) by mouth daily. Take with or immediately following a meal. 30 tablet 0  . nitroGLYCERIN (NITROSTAT) 0.4 MG SL tablet Place 0.4 mg under the tongue every 5 (five) minutes as needed for chest pain.    . predniSONE (DELTASONE) 5 MG tablet Take 10 mg by mouth daily with breakfast.     . RANEXA 1000 MG SR tablet Take 1 tablet by mouth  twice a day 180 tablet 1  . Selenium Sulfide 2.25 % FOAM Apply topically.    . triamcinolone lotion (KENALOG) 0.1 % Apply 1 application topically 3 (three) times daily.    . venlafaxine (EFFEXOR) 75 MG tablet Take 75 mg by mouth 3 (three) times daily with meals.     No current facility-administered medications for this visit.      Allergies:    Allergies  Allergen Reactions  . Bisoprolol   . Codeine   . Demerol [Meperidine]   . Imipramine   . Septra [Sulfamethoxazole-Trimethoprim]   . Stadol [Butorphanol]   . Statins     MYALGIAS  . Tequin [Gatifloxacin]     Social History:  The patient  reports that she has quit smoking. She does not have any smokeless tobacco history on file. She reports that she drinks alcohol. She reports that she does not use illicit drugs.   Family History:  The patient's family history includes Diabetes in her brother; Heart disease in her father and mother.   ROS:  Please see the history of present illness.  No nausea, vomiting.  No fevers, chills.  No focal weakness.  No dysuria.   Fatigue, muscle aches; All other systems reviewed and negative.   PHYSICAL EXAM: VS:  BP 124/62 mmHg  Pulse 63  Ht 4' 10" (1.473 m)  Wt 213 lb (96.616 kg)  BMI 44.53 kg/m2 Well  nourished, well developed, in no acute distress HEENT: normal Neck: no JVD, no carotid bruits Cardiac:  normal S1, S2; RRR;  Lungs:  clear to auscultation bilaterally, no wheezing, rhonchi or rales Abd: soft, nontender, no hepatomegaly, obese Ext: no edema Skin: warm and dry Neuro:   no focal abnormalities noted Psych: normal affect  EKG:  NSR, TWI in leads I and AVL; no change from prior    ASSESSMENT AND PLAN:  Coronary atherosclerosis of native coronary artery  Continue Aspirin 325 mg tablet, 325 mg, 1 tablet, orally, once a day Continue Metoprolol Succinate Tablet Extended Release 24 Hour, 100 MG, 1 tablet, Orally, once a day Continue Isosorbide Mononitrate Tablet Extended Release 24 Hour, 60 MG, 2 tablet, Orally, Once a day Mild angina, very rarely using NTG- sx beter than last year. Continue medical therapy. We discussed evaluation in the past with stress or cath, but she does not feel her sx are bad enough for any evaluation at this time.    2. Hypercholesteremia  Continue Fish Oil Capsule, 820 mg, 4 capsules, Orally, once a day  She is back on atorvastatin 10 mg daily. HDL 58, LDL 82 in 5/14.  Lipids to be checked by PMD. LDL target < 100.  Obtain most recent labs from Dr. Little's office.  They were checked in 3/15.   3. Obesity, unspecified  We spoke about the importance of weight loss.  Gained 8 lbs since last year.  She is now on steroids which may make weight loss harder.   Walking harder due to PMR.  She has used a walker recently due to the PMR.   4. HTN:  Well controlled.  COntinue current meds. Preventive Medicine  Adult topics discussed:  Diet: healthy diet, low calorie, low fat.  Exercise: at least 30 minutes of aerobic exercise, 5 days a week.    Follow Up     Signed, Jay S. Varanasi, MD, FACC 04/11/2014 11:59 AM   

## 2014-05-23 ENCOUNTER — Other Ambulatory Visit: Payer: Self-pay | Admitting: Interventional Cardiology

## 2014-06-14 ENCOUNTER — Other Ambulatory Visit: Payer: Self-pay | Admitting: Interventional Cardiology

## 2014-06-15 ENCOUNTER — Other Ambulatory Visit: Payer: Self-pay | Admitting: Interventional Cardiology

## 2014-06-15 ENCOUNTER — Other Ambulatory Visit: Payer: Self-pay

## 2014-06-15 MED ORDER — ISOSORBIDE MONONITRATE ER 60 MG PO TB24: ORAL_TABLET | ORAL | Status: DC

## 2014-07-07 ENCOUNTER — Other Ambulatory Visit: Payer: Self-pay | Admitting: Interventional Cardiology

## 2014-07-11 ENCOUNTER — Other Ambulatory Visit: Payer: Self-pay

## 2014-07-11 MED ORDER — AMLODIPINE BESYLATE 2.5 MG PO TABS: 2.5000 mg | ORAL_TABLET | Freq: Every day | ORAL | Status: DC

## 2014-12-02 ENCOUNTER — Other Ambulatory Visit: Payer: Self-pay | Admitting: Interventional Cardiology

## 2014-12-19 ENCOUNTER — Emergency Department (HOSPITAL_COMMUNITY): Payer: Medicare Other

## 2014-12-19 ENCOUNTER — Inpatient Hospital Stay (HOSPITAL_COMMUNITY)
Admission: EM | Admit: 2014-12-19 | Discharge: 2014-12-20 | DRG: 247 | Disposition: A | Discharge status: Home / Self Care | Payer: Medicare Other | Attending: Interventional Cardiology | Admitting: Interventional Cardiology

## 2014-12-19 ENCOUNTER — Encounter (HOSPITAL_COMMUNITY): Payer: Self-pay | Admitting: Emergency Medicine

## 2014-12-19 ENCOUNTER — Encounter (HOSPITAL_COMMUNITY)
Admission: EM | Disposition: A | Discharge status: Home / Self Care | Payer: Self-pay | Source: Home / Self Care | Attending: Interventional Cardiology

## 2014-12-19 DIAGNOSIS — Z888 Allergy status to other drugs, medicaments and biological substances status: Secondary | ICD-10-CM | POA: Diagnosis not present

## 2014-12-19 DIAGNOSIS — Z885 Allergy status to narcotic agent status: Secondary | ICD-10-CM

## 2014-12-19 DIAGNOSIS — Y838 Other surgical procedures as the cause of abnormal reaction of the patient, or of later complication, without mention of misadventure at the time of the procedure: Secondary | ICD-10-CM | POA: Diagnosis present

## 2014-12-19 DIAGNOSIS — R7309 Other abnormal glucose: Secondary | ICD-10-CM | POA: Diagnosis present

## 2014-12-19 DIAGNOSIS — E782 Mixed hyperlipidemia: Secondary | ICD-10-CM | POA: Diagnosis present

## 2014-12-19 DIAGNOSIS — Z6841 Body Mass Index (BMI) 40.0 and over, adult: Secondary | ICD-10-CM | POA: Diagnosis not present

## 2014-12-19 DIAGNOSIS — Z7982 Long term (current) use of aspirin: Secondary | ICD-10-CM | POA: Diagnosis not present

## 2014-12-19 DIAGNOSIS — I25710 Atherosclerosis of autologous vein coronary artery bypass graft(s) with unstable angina pectoris: Secondary | ICD-10-CM

## 2014-12-19 DIAGNOSIS — I25810 Atherosclerosis of coronary artery bypass graft(s) without angina pectoris: Secondary | ICD-10-CM | POA: Diagnosis present

## 2014-12-19 DIAGNOSIS — F329 Major depressive disorder, single episode, unspecified: Secondary | ICD-10-CM | POA: Diagnosis present

## 2014-12-19 DIAGNOSIS — Z79899 Other long term (current) drug therapy: Secondary | ICD-10-CM

## 2014-12-19 DIAGNOSIS — E785 Hyperlipidemia, unspecified: Secondary | ICD-10-CM | POA: Diagnosis not present

## 2014-12-19 DIAGNOSIS — I25700 Atherosclerosis of coronary artery bypass graft(s), unspecified, with unstable angina pectoris: Secondary | ICD-10-CM

## 2014-12-19 DIAGNOSIS — I771 Stricture of artery: Secondary | ICD-10-CM | POA: Diagnosis present

## 2014-12-19 DIAGNOSIS — G4733 Obstructive sleep apnea (adult) (pediatric): Secondary | ICD-10-CM | POA: Diagnosis not present

## 2014-12-19 DIAGNOSIS — I10 Essential (primary) hypertension: Secondary | ICD-10-CM | POA: Diagnosis present

## 2014-12-19 DIAGNOSIS — T82858A Stenosis of vascular prosthetic devices, implants and grafts, initial encounter: Secondary | ICD-10-CM | POA: Diagnosis present

## 2014-12-19 DIAGNOSIS — Z87891 Personal history of nicotine dependence: Secondary | ICD-10-CM | POA: Diagnosis not present

## 2014-12-19 DIAGNOSIS — Z951 Presence of aortocoronary bypass graft: Secondary | ICD-10-CM

## 2014-12-19 DIAGNOSIS — M353 Polymyalgia rheumatica: Secondary | ICD-10-CM | POA: Diagnosis present

## 2014-12-19 DIAGNOSIS — Z8249 Family history of ischemic heart disease and other diseases of the circulatory system: Secondary | ICD-10-CM

## 2014-12-19 DIAGNOSIS — I200 Unstable angina: Secondary | ICD-10-CM | POA: Diagnosis present

## 2014-12-19 DIAGNOSIS — Z7952 Long term (current) use of systemic steroids: Secondary | ICD-10-CM

## 2014-12-19 DIAGNOSIS — Z955 Presence of coronary angioplasty implant and graft: Secondary | ICD-10-CM

## 2014-12-19 DIAGNOSIS — R079 Chest pain, unspecified: Secondary | ICD-10-CM | POA: Diagnosis not present

## 2014-12-19 HISTORY — DX: Polymyalgia rheumatica: M35.3

## 2014-12-19 HISTORY — PX: CARDIAC CATHETERIZATION: SHX172

## 2014-12-19 LAB — CBC WITH DIFFERENTIAL/PLATELET
Basophils Absolute: 0 10*3/uL (ref 0.0–0.1)
Basophils Relative: 0 %
Eosinophils Absolute: 0.5 10*3/uL (ref 0.0–0.7)
Eosinophils Relative: 4 %
HCT: 34.3 % — ABNORMAL LOW (ref 36.0–46.0)
Hemoglobin: 11.1 g/dL — ABNORMAL LOW (ref 12.0–15.0)
Lymphocytes Relative: 23 %
Lymphs Abs: 2.9 10*3/uL (ref 0.7–4.0)
MCH: 30.3 pg (ref 26.0–34.0)
MCHC: 32.4 g/dL (ref 30.0–36.0)
MCV: 93.7 fL (ref 78.0–100.0)
Monocytes Absolute: 0.7 10*3/uL (ref 0.1–1.0)
Monocytes Relative: 6 %
Neutro Abs: 8.4 10*3/uL — ABNORMAL HIGH (ref 1.7–7.7)
Neutrophils Relative %: 67 %
Platelets: 248 10*3/uL (ref 150–400)
RBC: 3.66 MIL/uL — ABNORMAL LOW (ref 3.87–5.11)
RDW: 13.9 % (ref 11.5–15.5)
WBC: 12.5 10*3/uL — ABNORMAL HIGH (ref 4.0–10.5)

## 2014-12-19 LAB — PROTIME-INR
INR: 1.11 (ref 0.00–1.49)
Prothrombin Time: 14.5 seconds (ref 11.6–15.2)

## 2014-12-19 LAB — CBC
HCT: 32.7 % — ABNORMAL LOW (ref 36.0–46.0)
Hemoglobin: 10.5 g/dL — ABNORMAL LOW (ref 12.0–15.0)
MCH: 30.1 pg (ref 26.0–34.0)
MCHC: 32.1 g/dL (ref 30.0–36.0)
MCV: 93.7 fL (ref 78.0–100.0)
Platelets: 229 10*3/uL (ref 150–400)
RBC: 3.49 MIL/uL — ABNORMAL LOW (ref 3.87–5.11)
RDW: 14.1 % (ref 11.5–15.5)
WBC: 8.3 10*3/uL (ref 4.0–10.5)

## 2014-12-19 LAB — POCT ACTIVATED CLOTTING TIME: Activated Clotting Time: 423 seconds

## 2014-12-19 LAB — I-STAT CHEM 8, ED
BUN: 26 mg/dL — ABNORMAL HIGH (ref 6–20)
Calcium, Ion: 1.14 mmol/L (ref 1.13–1.30)
Chloride: 105 mmol/L (ref 101–111)
Creatinine, Ser: 1.2 mg/dL — ABNORMAL HIGH (ref 0.44–1.00)
Glucose, Bld: 101 mg/dL — ABNORMAL HIGH (ref 65–99)
HCT: 34 % — ABNORMAL LOW (ref 36.0–46.0)
Hemoglobin: 11.6 g/dL — ABNORMAL LOW (ref 12.0–15.0)
Potassium: 4.5 mmol/L (ref 3.5–5.1)
Sodium: 140 mmol/L (ref 135–145)
TCO2: 24 mmol/L (ref 0–100)

## 2014-12-19 LAB — I-STAT TROPONIN, ED: Troponin i, poc: 0 ng/mL (ref 0.00–0.08)

## 2014-12-19 LAB — APTT: aPTT: 100 seconds — ABNORMAL HIGH (ref 24–37)

## 2014-12-19 LAB — CREATININE, SERUM
Creatinine, Ser: 1.04 mg/dL — ABNORMAL HIGH (ref 0.44–1.00)
GFR calc Af Amer: 59 mL/min — ABNORMAL LOW (ref >60)
GFR calc non Af Amer: 51 mL/min — ABNORMAL LOW (ref >60)

## 2014-12-19 LAB — TROPONIN I
Troponin I: <0.03 ng/mL (ref <0.031)
Troponin I: <0.03 ng/mL (ref <0.031)
Troponin I: 0.03 ng/mL (ref <0.031)

## 2014-12-19 LAB — TSH: TSH: 2.564 u[IU]/mL (ref 0.350–4.500)

## 2014-12-19 SURGERY — LEFT HEART CATH AND CORS/GRAFTS ANGIOGRAPHY

## 2014-12-19 MED ORDER — NITROGLYCERIN 1 MG/10 ML FOR IR/CATH LAB
INTRA_ARTERIAL | Status: DC | PRN
  Administered 2014-12-19: 15:00:00

## 2014-12-19 MED ORDER — VERAPAMIL HCL 2.5 MG/ML IV SOLN
INTRAVENOUS | Status: AC
  Filled 2014-12-19: qty 2

## 2014-12-19 MED ORDER — BIVALIRUDIN 250 MG IV SOLR
INTRAVENOUS | Status: AC
  Filled 2014-12-19: qty 250

## 2014-12-19 MED ORDER — FLUTICASONE PROPIONATE 50 MCG/ACT NA SUSP: 2.0000 | Freq: Every day | NASAL | Status: DC | PRN

## 2014-12-19 MED ORDER — PREDNISONE 1 MG PO TABS
1.0000 mg | ORAL_TABLET | Freq: Every day | ORAL | Status: DC
  Administered 2014-12-20: 1 mg via ORAL
  Filled 2014-12-19 (×3): qty 1

## 2014-12-19 MED ORDER — HEPARIN (PORCINE) IN NACL 2-0.9 UNIT/ML-% IJ SOLN
INTRAMUSCULAR | Status: DC | PRN
  Administered 2014-12-19: 14:00:00 via INTRA_ARTERIAL

## 2014-12-19 MED ORDER — ONDANSETRON HCL 4 MG/2ML IJ SOLN: 4.0000 mg | Freq: Four times a day (QID) | INTRAMUSCULAR | Status: DC | PRN

## 2014-12-19 MED ORDER — NITROGLYCERIN IN D5W 200-5 MCG/ML-% IV SOLN
3.0000 ug/min | INTRAVENOUS | Status: DC
  Administered 2014-12-19: 3 ug/min via INTRAVENOUS
  Administered 2014-12-19: 5 ug/min via INTRAVENOUS
  Filled 2014-12-19: qty 250

## 2014-12-19 MED ORDER — LEVOTHYROXINE SODIUM 75 MCG PO TABS
75.0000 ug | ORAL_TABLET | Freq: Every day | ORAL | Status: DC
  Filled 2014-12-19: qty 1

## 2014-12-19 MED ORDER — HEPARIN BOLUS VIA INFUSION
3000.0000 [IU] | Freq: Once | INTRAVENOUS | Status: AC
  Administered 2014-12-19: 3000 [IU] via INTRAVENOUS
  Filled 2014-12-19: qty 3000

## 2014-12-19 MED ORDER — LIDOCAINE HCL (PF) 1 % IJ SOLN
INTRAMUSCULAR | Status: AC
  Filled 2014-12-19: qty 30

## 2014-12-19 MED ORDER — IOHEXOL 350 MG/ML SOLN
INTRAVENOUS | Status: DC | PRN
  Administered 2014-12-19: 95 mL via INTRACARDIAC

## 2014-12-19 MED ORDER — MIDAZOLAM HCL 2 MG/2ML IJ SOLN
INTRAMUSCULAR | Status: DC | PRN
  Administered 2014-12-19 (×2): 1 mg via INTRAVENOUS
  Administered 2014-12-19: 2 mg via INTRAVENOUS

## 2014-12-19 MED ORDER — ATORVASTATIN CALCIUM 10 MG PO TABS
10.0000 mg | ORAL_TABLET | Freq: Every day | ORAL | Status: DC
  Filled 2014-12-19: qty 1

## 2014-12-19 MED ORDER — RANOLAZINE ER 500 MG PO TB12
1000.0000 mg | ORAL_TABLET | Freq: Two times a day (BID) | ORAL | Status: DC
  Administered 2014-12-19 – 2014-12-20 (×3): 1000 mg via ORAL
  Filled 2014-12-19 (×3): qty 2

## 2014-12-19 MED ORDER — FENTANYL CITRATE (PF) 100 MCG/2ML IJ SOLN
INTRAMUSCULAR | Status: DC | PRN
  Administered 2014-12-19 (×3): 25 ug via INTRAVENOUS

## 2014-12-19 MED ORDER — METOPROLOL SUCCINATE ER 100 MG PO TB24
100.0000 mg | ORAL_TABLET | Freq: Every day | ORAL | Status: DC
  Filled 2014-12-19: qty 1

## 2014-12-19 MED ORDER — HEPARIN (PORCINE) IN NACL 2-0.9 UNIT/ML-% IJ SOLN
INTRAMUSCULAR | Status: AC
  Filled 2014-12-19: qty 1000

## 2014-12-19 MED ORDER — VITAMIN D 1000 UNITS PO TABS
1000.0000 [IU] | ORAL_TABLET | Freq: Every day | ORAL | Status: DC
  Administered 2014-12-20: 1000 [IU] via ORAL
  Filled 2014-12-19: qty 1

## 2014-12-19 MED ORDER — OMEGA-3-ACID ETHYL ESTERS 1 G PO CAPS
1.0000 g | ORAL_CAPSULE | Freq: Every day | ORAL | Status: DC
  Administered 2014-12-19 – 2014-12-20 (×2): 1 g via ORAL
  Filled 2014-12-19 (×2): qty 1

## 2014-12-19 MED ORDER — NITROGLYCERIN 1 MG/10 ML FOR IR/CATH LAB
INTRA_ARTERIAL | Status: AC
  Filled 2014-12-19: qty 10

## 2014-12-19 MED ORDER — AMLODIPINE BESYLATE 5 MG PO TABS
2.5000 mg | ORAL_TABLET | Freq: Every day | ORAL | Status: DC
  Administered 2014-12-20: 08:00:00 2.5 mg via ORAL
  Filled 2014-12-19: qty 1

## 2014-12-19 MED ORDER — MIDAZOLAM HCL 2 MG/2ML IJ SOLN
INTRAMUSCULAR | Status: AC
  Filled 2014-12-19: qty 4

## 2014-12-19 MED ORDER — SODIUM CHLORIDE 0.9 % IJ SOLN
3.0000 mL | Freq: Two times a day (BID) | INTRAMUSCULAR | Status: DC
  Administered 2014-12-19: 3 mL via INTRAVENOUS

## 2014-12-19 MED ORDER — ASPIRIN 325 MG PO TABS: 325.0000 mg | ORAL_TABLET | Freq: Every day | ORAL | Status: DC

## 2014-12-19 MED ORDER — ACETAMINOPHEN 325 MG PO TABS: 650.0000 mg | ORAL_TABLET | ORAL | Status: DC | PRN

## 2014-12-19 MED ORDER — PSYLLIUM 400 MG PO CAPS: 400.0000 mg | ORAL_CAPSULE | Freq: Every day | ORAL | Status: DC

## 2014-12-19 MED ORDER — ASPIRIN 81 MG PO CHEW
81.0000 mg | CHEWABLE_TABLET | Freq: Once | ORAL | Status: AC
  Administered 2014-12-19: 81 mg via ORAL
  Filled 2014-12-19: qty 1

## 2014-12-19 MED ORDER — SODIUM CHLORIDE 0.9 % IV SOLN: 250.0000 mL | INTRAVENOUS | Status: DC | PRN

## 2014-12-19 MED ORDER — SODIUM CHLORIDE 0.9 % IV SOLN
1.7500 mg/kg/h | INTRAVENOUS | Status: AC
  Filled 2014-12-19: qty 250

## 2014-12-19 MED ORDER — ADENOSINE (DIAGNOSTIC) FOR INTRACORONARY USE
INTRAVENOUS | Status: DC | PRN
Start: 2014-12-19 — End: 2014-12-19
  Administered 2014-12-19: 30 ug via INTRACORONARY
  Administered 2014-12-19: 60 ug via INTRACORONARY
  Administered 2014-12-19 (×2): 30 ug via INTRACORONARY

## 2014-12-19 MED ORDER — COENZYME Q10 10 MG PO CAPS: 10.0000 mg | ORAL_CAPSULE | Freq: Every day | ORAL | Status: DC

## 2014-12-19 MED ORDER — NITROGLYCERIN 0.4 MG SL SUBL
0.4000 mg | SUBLINGUAL_TABLET | SUBLINGUAL | Status: DC | PRN
Start: 2014-12-19 — End: 2014-12-20
  Administered 2014-12-19 – 2014-12-20 (×2): 0.4 mg via SUBLINGUAL
  Filled 2014-12-19 (×2): qty 1

## 2014-12-19 MED ORDER — ASPIRIN 81 MG PO CHEW
81.0000 mg | CHEWABLE_TABLET | Freq: Every day | ORAL | Status: DC
  Administered 2014-12-20: 08:00:00 81 mg via ORAL
  Filled 2014-12-19: qty 1

## 2014-12-19 MED ORDER — SODIUM CHLORIDE 0.9 % WEIGHT BASED INFUSION: 3.0000 mL/kg/h | INTRAVENOUS | Status: AC

## 2014-12-19 MED ORDER — NITROGLYCERIN 2 % TD OINT: 0.5000 [in_us] | TOPICAL_OINTMENT | Freq: Once | TRANSDERMAL | Status: DC

## 2014-12-19 MED ORDER — ADENOSINE 6 MG/2ML IV SOLN
INTRAVENOUS | Status: AC
  Filled 2014-12-19: qty 2

## 2014-12-19 MED ORDER — VENLAFAXINE HCL 75 MG PO TABS
225.0000 mg | ORAL_TABLET | Freq: Every day | ORAL | Status: DC
  Administered 2014-12-19 – 2014-12-20 (×2): 225 mg via ORAL
  Filled 2014-12-19 (×3): qty 3

## 2014-12-19 MED ORDER — PSYLLIUM 95 % PO PACK
1.0000 | PACK | Freq: Every day | ORAL | Status: DC
  Filled 2014-12-19: qty 1

## 2014-12-19 MED ORDER — ATORVASTATIN CALCIUM 10 MG PO TABS
10.0000 mg | ORAL_TABLET | Freq: Every day | ORAL | Status: DC
  Administered 2014-12-19: 22:00:00 10 mg via ORAL
  Filled 2014-12-19: qty 1

## 2014-12-19 MED ORDER — LEVOTHYROXINE SODIUM 75 MCG PO TABS
75.0000 ug | ORAL_TABLET | Freq: Every day | ORAL | Status: DC
  Administered 2014-12-19: 75 ug via ORAL
  Filled 2014-12-19: qty 1

## 2014-12-19 MED ORDER — BIVALIRUDIN BOLUS VIA INFUSION - CUPID
INTRAVENOUS | Status: DC | PRN
Start: 2014-12-19 — End: 2014-12-19
  Administered 2014-12-19: 71.475 mg via INTRAVENOUS

## 2014-12-19 MED ORDER — SODIUM CHLORIDE 0.9 % WEIGHT BASED INFUSION
3.0000 mL/kg/h | INTRAVENOUS | Status: DC
  Administered 2014-12-19: 3 mL/kg/h via INTRAVENOUS

## 2014-12-19 MED ORDER — BIVALIRUDIN 250 MG IV SOLR
250.0000 mg | INTRAVENOUS | Status: DC | PRN
  Administered 2014-12-19 (×2): 1.75 mg/kg/h via INTRAVENOUS

## 2014-12-19 MED ORDER — TICAGRELOR 90 MG PO TABS
ORAL_TABLET | ORAL | Status: DC | PRN
  Administered 2014-12-19: 180 mg via ORAL

## 2014-12-19 MED ORDER — SODIUM CHLORIDE 0.9 % IJ SOLN: 3.0000 mL | Freq: Two times a day (BID) | INTRAMUSCULAR | Status: DC

## 2014-12-19 MED ORDER — LEVOCETIRIZINE DIHYDROCHLORIDE 5 MG PO TABS: 5.0000 mg | ORAL_TABLET | Freq: Every evening | ORAL | Status: DC

## 2014-12-19 MED ORDER — TICAGRELOR 90 MG PO TABS
90.0000 mg | ORAL_TABLET | Freq: Two times a day (BID) | ORAL | Status: DC
  Administered 2014-12-20: 04:00:00 90 mg via ORAL
  Filled 2014-12-19 (×2): qty 1

## 2014-12-19 MED ORDER — SODIUM CHLORIDE 0.9 % IJ SOLN: 3.0000 mL | INTRAMUSCULAR | Status: DC | PRN

## 2014-12-19 MED ORDER — HEPARIN (PORCINE) IN NACL 100-0.45 UNIT/ML-% IJ SOLN
1000.0000 [IU]/h | INTRAMUSCULAR | Status: DC
  Administered 2014-12-19: 1000 [IU]/h via INTRAVENOUS
  Filled 2014-12-19: qty 250

## 2014-12-19 MED ORDER — HEPARIN SODIUM (PORCINE) 5000 UNIT/ML IJ SOLN
5000.0000 [IU] | Freq: Three times a day (TID) | INTRAMUSCULAR | Status: DC
Start: 2014-12-19 — End: 2014-12-20
  Administered 2014-12-20: 5000 [IU] via SUBCUTANEOUS
  Filled 2014-12-19: qty 1

## 2014-12-19 MED ORDER — SODIUM CHLORIDE 0.9 % WEIGHT BASED INFUSION
1.0000 mL/kg/h | INTRAVENOUS | Status: DC
  Administered 2014-12-19: 1 mL/kg/h via INTRAVENOUS

## 2014-12-19 MED ORDER — TICAGRELOR 90 MG PO TABS
ORAL_TABLET | ORAL | Status: AC
  Filled 2014-12-19: qty 1

## 2014-12-19 MED ORDER — FENTANYL CITRATE (PF) 100 MCG/2ML IJ SOLN
INTRAMUSCULAR | Status: AC
  Filled 2014-12-19: qty 4

## 2014-12-19 MED ORDER — METOPROLOL SUCCINATE ER 100 MG PO TB24
100.0000 mg | ORAL_TABLET | Freq: Every day | ORAL | Status: DC
  Administered 2014-12-19: 22:00:00 100 mg via ORAL
  Filled 2014-12-19 (×2): qty 1

## 2014-12-19 MED ORDER — LORATADINE 10 MG PO TABS
10.0000 mg | ORAL_TABLET | Freq: Every day | ORAL | Status: DC
  Administered 2014-12-19: 10 mg via ORAL
  Filled 2014-12-19: qty 1

## 2014-12-19 SURGICAL SUPPLY — 21 items
CATH INFINITI 5 FR JL3.5 (CATHETERS) ×3 IMPLANT
CATH INFINITI 5FR JL4 (CATHETERS) ×3 IMPLANT
CATH INFINITI JR4 5F (CATHETERS) ×3 IMPLANT
DEVICE RAD COMP TR BAND LRG (VASCULAR PRODUCTS) ×3 IMPLANT
DEVICE WIRE ANGIOSEAL 6FR (Vascular Products) ×3 IMPLANT
GLIDESHEATH SLEND SS 6F .021 (SHEATH) ×3 IMPLANT
GUIDE CATH RUNWAY 6FR FR4 (CATHETERS) ×3 IMPLANT
KIT ENCORE 26 ADVANTAGE (KITS) ×3 IMPLANT
KIT HEART LEFT (KITS) ×3 IMPLANT
PACK CARDIAC CATHETERIZATION (CUSTOM PROCEDURE TRAY) ×3 IMPLANT
SHEATH PINNACLE 5F 10CM (SHEATH) ×3 IMPLANT
SHEATH PINNACLE 6F 10CM (SHEATH) ×3 IMPLANT
STENT SYNERGY DES 3.5X28 (Permanent Stent) ×3 IMPLANT
STENT SYNERGY DES 3.5X8 (Permanent Stent) ×3 IMPLANT
TRANSDUCER W/STOPCOCK (MISCELLANEOUS) ×3 IMPLANT
TUBING CIL FLEX 10 FLL-RA (TUBING) ×3 IMPLANT
VALVE GUARDIAN II ~~LOC~~ HEMO (MISCELLANEOUS) ×3 IMPLANT
WIRE ASAHI PROWATER 180CM (WIRE) ×3 IMPLANT
WIRE EMERALD 3MM-J .035X150CM (WIRE) ×3 IMPLANT
WIRE HI TORQ VERSACORE-J 145CM (WIRE) ×3 IMPLANT
WIRE SAFE-T 1.5MM-J .035X260CM (WIRE) ×3 IMPLANT

## 2014-12-19 NOTE — Progress Notes (Signed)
ANTICOAGULATION CONSULT NOTE - Initial Consult  Pharmacy Consult for heparin Indication: chest pain/ACS  Allergies  Allergen Reactions  . Bisoprolol   . Codeine   . Demerol [Meperidine]   . Imipramine   . Septra [Sulfamethoxazole-Trimethoprim]   . Stadol [Butorphanol]   . Statins     MYALGIAS  . Tequin [Gatifloxacin]     Patient Measurements: Height: 4\' 10"  (147.3 cm) Weight: 210 lb (95.255 kg) IBW/kg (Calculated) : 40.9 Heparin Dosing Weight: 65kg  Vital Signs: Temp: 97.8 F (36.6 C) (09/28 0431) Temp Source: Oral (09/28 0431) BP: 120/48 mmHg (09/28 0553) Pulse Rate: 64 (09/28 0553)  Labs:  Recent Labs  12/19/14 0430 12/19/14 0434  HGB 11.1* 11.6*  HCT 34.3* 34.0*  PLT 248  --   CREATININE  --  1.20*    Estimated Creatinine Clearance: 40.1 mL/min (by C-G formula based on Cr of 1.2).   Medical History: Past Medical History  Diagnosis Date  . Coronary artery disease   . Hyperlipidemia   . OSA on CPAP   . Depression     with anxious component  . Obesity   . Stable angina     microvascular, improved with Ranexa    Assessment: 74yo female c/o acute onset of CP ~2200 last pm, took NTG x3 and ASA which relieved pain until 0300, took additional NTG x2, now pt describes current pain as anginal equivalent, to begin heparin.  Goal of Therapy:  Heparin level 0.3-0.7 units/ml Monitor platelets by anticoagulation protocol: Yes   Plan:  Will give heparin 3000 units IV bolus x1 followed by gtt at 900 units/hr and monitor heparin levels and CBC.  Wynona Neat, PharmD, BCPS  12/19/2014,5:57 AM

## 2014-12-19 NOTE — Interval H&P Note (Signed)
Cath Lab Visit (complete for each Cath Lab visit)  Clinical Evaluation Leading to the Procedure:   ACS: Yes.    Non-ACS:    Anginal Classification: CCS IV  Anti-ischemic medical therapy: Minimal Therapy (1 class of medications)  Non-Invasive Test Results: No non-invasive testing performed  Prior CABG: Previous CABG      History and Physical Interval Note:  12/19/2014 2:09 PM  Erin Ingram  has presented today for surgery, with the diagnosis of cp  The various methods of treatment have been discussed with the patient and family. After consideration of risks, benefits and other options for treatment, the patient has consented to  Procedure(s): Left Heart Cath and Cors/Grafts Angiography (N/A) as a surgical intervention .  The patient's history has been reviewed, patient examined, no change in status, stable for surgery.  I have reviewed the patient's chart and labs.  Questions were answered to the patient's satisfaction.     Rameen Quinney S.

## 2014-12-19 NOTE — ED Notes (Signed)
Dr. Ellyn Hack paged @0635 

## 2014-12-19 NOTE — ED Notes (Signed)
Pts husband has pts purse, and friends home documentation.

## 2014-12-19 NOTE — Progress Notes (Signed)
PHARMACIST - PHYSICIAN ORDER COMMUNICATION  CONCERNING: P&T Medication Policy on Herbal Medications  DESCRIPTION:  This patient's order for:  Co-enzyme Q  has been noted.  This product(s) is classified as an "herbal" or natural product. Due to a lack of definitive safety studies or FDA approval, nonstandard manufacturing practices, plus the potential risk of unknown drug-drug interactions while on inpatient medications, the Pharmacy and Therapeutics Committee does not permit the use of "herbal" or natural products of this type within [facility].   ACTION TAKEN: The pharmacy department is unable to verify this order at this time and your patient has been informed of this safety policy. Please reevaluate patient's clinical condition at discharge and address if the herbal or natural product(s) should be resumed at that time. Eudelia Bunch, Pharm.D. [phone] 12/19/2014 9:21 AM

## 2014-12-19 NOTE — ED Notes (Signed)
Pt states that she had an acute onset of chest pain around 2200 last night. Pt took 3 nitro pills and 325 of ASA. Pt had pain relief until 0300 this morning, when chest pain came on again.  Pt took two nitro pills and facility called 911.

## 2014-12-19 NOTE — H&P (Signed)
Physician History and Physical    Patient ID: Erin Ingram MRN: [id number] DOB/AGE: [birth year] 74 y.o. Admit date: 12/19/2014  Primary Care Physician: [name], MD Primary Cardiologist Casandra Doffing MD  HPI: Erin Ingram is a very pleasant 74 yo WF who presents with unstable angina. She is s/p CABG in 2000. This included a SVG/ free LIMA Y graft to the diagonal and distal LAD, SVG to the OM 1, and SVG to the PDA. Repeat cardiac cath in 2008 showed patent grafts. She has chronic angina over the years and was treated with EECP many years ago. She has been on chronic nitrates, amlodipine, metoprolol, and Ranexa. Historically she has angina about 2-4 times a month where she has to take sl Ntg. Over the past 2 months she has noted increasing symptoms of angina  in frequency and severity. Previously she would get SOB walking up incline but now gets dyspnea just walking on level ground. Her angina is typically in her left arm and shoulder radiating into her back and sometimes into right shoulder. She does have some mid sternal pain as well. Last night at 10 pm she had more severe chest pain and took 3 sl Ntg. Her pain seemed to wax and wane. She went to sleep but awoke at 3 am with more severe pain and came to the ED. In the past she felt like her pain was stable and she didn't think further evaluation was needed but now feels it is time to have it checked out. She is still having low grade pain.   Review of systems complete and found to be negative unless listed above  Past Medical History  Diagnosis Date  . Coronary artery disease   . Hyperlipidemia   . OSA on CPAP   . Depression     with anxious component  . Obesity   . Stable angina     microvascular, improved with Ranexa  . PMR (polymyalgia rheumatica)     Family History  Problem Relation Age of Onset  . Heart disease Mother   . Heart disease Father   . Diabetes Brother     Social History   Social History  . Marital  Status: Single    Spouse Name: N/A  . Number of Children: N/A  . Years of Education: N/A   Occupational History  . Not on file.   Social History Main Topics  . Smoking status: Former Research scientist (life sciences)  . Smokeless tobacco: Not on file  . Alcohol Use: Yes  . Drug Use: No  . Sexual Activity: Not on file   Other Topics Concern  . Not on file   Social History Narrative    Past Surgical History  Procedure Laterality Date  . Cataract extraction Bilateral 2011  . Breast biopsy  1964  . Repair of left patellar  1993  . Cardiac catheterization  08    patent grafts, no culprit lesions, EF 65%  . Coronary artery bypass graft  2000    ASCVD, multivessel, S./P.       Medication List    ASK your doctor about these medications        amLODipine 2.5 MG tablet  Commonly known as:  NORVASC  Take 1 tablet (2.5 mg total) by mouth daily.     aspirin 325 MG tablet  Take 325 mg by mouth daily.     atorvastatin 10 MG tablet  Commonly known as:  LIPITOR  Take 10 mg by mouth daily.  Coenzyme Q10 10 MG capsule  Take 10 mg by mouth daily.     FISH OIL PO  Take 1 tablet by mouth daily.     fluocinonide cream 0.05 %  Commonly known as:  LIDEX  Apply 1 application topically 2 (two) times daily.     fluticasone 50 MCG/ACT nasal spray  Commonly known as:  FLONASE  Place 2 sprays into both nostrils daily.     isosorbide mononitrate 60 MG 24 hr tablet  Commonly known as:  IMDUR  Take 2 tablets by mouth  once a day     levocetirizine 5 MG tablet  Commonly known as:  XYZAL  Take 5 mg by mouth every evening.     levothyroxine 75 MCG tablet  Commonly known as:  SYNTHROID, LEVOTHROID  Take 75 mcg by mouth daily before breakfast.     metoprolol succinate 100 MG 24 hr tablet  Commonly known as:  TOPROL-XL  Take 1 tablet by mouth once a day     nitroGLYCERIN 0.4 MG SL tablet  Commonly known as:  NITROSTAT  Place 0.4 mg under the tongue every 5 (five) minutes as needed for chest pain.       predniSONE 1 MG tablet  Commonly known as:  DELTASONE  Take 1 mg by mouth daily with breakfast.     Psyllium 400 MG Caps  Take 400 mg by mouth daily.     RANEXA 1000 MG SR tablet  Generic drug:  ranolazine  Take 1 tablet by mouth  twice a day     venlafaxine 75 MG tablet  Commonly known as:  EFFEXOR  Take 225 mg by mouth daily.     VITAMIN D PO  Take 1 tablet by mouth daily.        Physical Exam: Blood pressure 114/42, pulse 66, temperature 97.8 F (36.6 C), temperature source Oral, resp. rate 16, height 4\' 10"  (1.473 m), weight 95.255 kg (210 lb), SpO2 98 %.  Current Weight  12/19/14 95.255 kg (210 lb)  04/11/14 96.616 kg (213 lb)  04/07/13 92.987 kg (205 lb)    GENERAL:  Well appearing, obese WF in NAD.  HEENT:  PERRL, EOMI, sclera are clear. Oropharynx is clear. NECK:  No jugular venous distention, carotid upstroke brisk and symmetric, no bruits, no thyromegaly or adenopathy LUNGS:  Clear to auscultation bilaterally CHEST:  Unremarkable, old median sternotomy scar. HEART:  RRR,  PMI not displaced or sustained,S1 and S2 within normal limits, no S3, no S4: no clicks, no rubs, no murmurs ABD:  Soft, obese,  nontender. BS +, no masses or bruits. No hepatomegaly, no splenomegaly EXT:  2 + pulses throughout including radial, no edema, no cyanosis no clubbing. Old surgical scars from vein harvest right leg and left knee surgery. SKIN:  Warm and dry.  No rashes NEURO:  Alert and oriented x 3. Cranial nerves II through XII intact. PSYCH:  Cognitively intact    Labs:   Lab Results  Component Value Date   WBC 12.5* 12/19/2014   HGB 11.6* 12/19/2014   HCT 34.0* 12/19/2014   MCV 93.7 12/19/2014   PLT 248 12/19/2014     Recent Labs Lab 12/19/14 0434  NA 140  K 4.5  CL 105  BUN 26*  CREATININE 1.20*  GLUCOSE 101*   No results found for: CKMB, CKMBINDEX, TROPONINI No results found for: CHOL No results found for: HDL No results found for: LDLCALC No results  found for: TRIG No results found  for: CHOLHDL No results found for: LDLDIRECT  No results found for: PROBNP No results found for: TSH No results found for: HGBA1C  Radiology: Dg Chest 2 View  12/19/2014   CLINICAL DATA:  Initial valuation for acute chest pain radiating into back were shortness of breath.  EXAM: CHEST  2 VIEW  COMPARISON:  Prior radiograph from 04/09/2006.  FINDINGS: Median sternotomy wires underlying CABG markers and surgical clips present. Cardiomegaly noted. Prominent atheromatous plaque within the aortic arch.  Lungs are normally inflated. No focal infiltrate identified. Mild diffuse interstitial prominence without overt pulmonary edema. No pleural effusion. No pneumothorax.  No acute osseus abnormality para scatter multilevel degenerative changes present within the visualized spine.  IMPRESSION: 1. No active cardiopulmonary disease. 2. Cardiomegaly with sequela of prior CABG.   Electronically Signed   By: Jeannine Boga M.D.   On: 12/19/2014 05:45    EKG: NSR with low voltage. No acute ST-T changes.   ASSESSMENT AND PLAN:  1. Unstable angina. Now 16 years s/p CABG. Concerned about progressive graft disease. Will start IV heparin and Ntg. Continue prior meds. Plan cardiac cath today with Dr Irish Lack. The procedure and risks were reviewed including but not limited to death, myocardial infarction, stroke, arrythmias, bleeding, transfusion, emergency surgery, dye allergy, or renal dysfunction. The patient voices understanding and is agreeable to proceed.  2. Obesity  3. Hyperlipidemia. On statin. Will check lipid panel.   4. OSA on CPAP.  5. PMR on chronic steroids.  Signed: Peter Martinique, [place]  12/19/2014, 7:44 AM

## 2014-12-19 NOTE — ED Provider Notes (Signed)
CSN: [id number]     Arrival date & time 12/19/14  0419 History   First MD Initiated Contact with Patient 12/19/14 [phone]     Chief Complaint  Patient presents with  . Chest Pain     (Consider location/radiation/quality/duration/timing/severity/associated sxs/prior Treatment) Patient is a 74 y.o. female presenting with chest pain. The history is provided by the patient.  Chest Pain Chest pain location: right shjoulder and neck now the left shoulder and neck, states this is her anginal equivalent  Pain quality: dull   Pain radiates to:  Upper back and neck Pain severity:  Moderate Onset quality:  Sudden Timing:  Constant Progression:  Partially resolved Chronicity:  Recurrent Context: at rest   Relieved by:  Nitroglycerin and aspirin Associated symptoms: shortness of breath   Associated symptoms: no abdominal pain, no diaphoresis, no fever, no lower extremity edema and no palpitations   Associated symptoms comment:  Had increasing DOE and pain for months frequency is more often with any exertion Risk factors: coronary artery disease     Past Medical History  Diagnosis Date  . Coronary artery disease   . Hyperlipidemia   . OSA on CPAP   . Depression     with anxious component  . Obesity   . Stable angina     microvascular, improved with Ranexa   Past Surgical History  Procedure Laterality Date  . Cataract extraction Bilateral 2011  . Breast biopsy  1964  . Repair of left patellar  1993  . Cardiac catheterization  08    patent grafts, no culprit lesions, EF 65%  . Coronary artery bypass graft  2000    ASCVD, multivessel, S./P.   Family History  Problem Relation Age of Onset  . Heart disease Mother   . Heart disease Father   . Diabetes Brother    Social History  Substance Use Topics  . Smoking status: Former Research scientist (life sciences)  . Smokeless tobacco: None  . Alcohol Use: Yes   OB History    No data available     Review of Systems  Constitutional: Negative for fever and  diaphoresis.  Respiratory: Positive for shortness of breath. Negative for wheezing.   Cardiovascular: Positive for chest pain. Negative for palpitations and leg swelling.  Gastrointestinal: Negative for abdominal pain.  All other systems reviewed and are negative.     Allergies  Bisoprolol; Codeine; Demerol; Imipramine; Septra; Stadol; Statins; and Tequin  Home Medications   Prior to Admission medications   Medication Sig Start Date End Date Taking? Authorizing Provider  amLODipine (NORVASC) 2.5 MG tablet Take 1 tablet (2.5 mg total) by mouth daily. 07/11/14  Yes Jettie Booze, MD  aspirin 325 MG tablet Take 325 mg by mouth daily.   Yes Historical Provider, MD  atorvastatin (LIPITOR) 10 MG tablet Take 10 mg by mouth daily.   Yes Historical Provider, MD  Cholecalciferol (VITAMIN D PO) Take 1 tablet by mouth daily.   Yes Historical Provider, MD  Coenzyme Q10 10 MG capsule Take 10 mg by mouth daily.   Yes Historical Provider, MD  fluticasone (FLONASE) 50 MCG/ACT nasal spray Place 2 sprays into both nostrils daily.   Yes Historical Provider, MD  isosorbide mononitrate (IMDUR) 60 MG 24 hr tablet Take 2 tablets by mouth  once a day 06/15/14  Yes Jettie Booze, MD  levocetirizine (XYZAL) 5 MG tablet Take 5 mg by mouth every evening.   Yes Historical Provider, MD  levothyroxine (SYNTHROID, LEVOTHROID) 75 MCG tablet Take 75  mcg by mouth daily before breakfast.   Yes Historical Provider, MD  metoprolol succinate (TOPROL-XL) 100 MG 24 hr tablet Take 1 tablet by mouth once a day 12/03/14  Yes Jettie Booze, MD  nitroGLYCERIN (NITROSTAT) 0.4 MG SL tablet Place 0.4 mg under the tongue every 5 (five) minutes as needed for chest pain.   Yes Historical Provider, MD  Omega-3 Fatty Acids (FISH OIL PO) Take 1 tablet by mouth daily.   Yes Historical Provider, MD  predniSONE (DELTASONE) 1 MG tablet Take 1 mg by mouth daily with breakfast.   Yes Historical Provider, MD  Psyllium 400 MG CAPS Take  400 mg by mouth daily.   Yes Historical Provider, MD  RANEXA 1000 MG SR tablet Take 1 tablet by mouth  twice a day 07/09/14  Yes Jettie Booze, MD  venlafaxine [facility]) 75 MG tablet Take 225 mg by mouth daily.    Yes Historical Provider, MD  fluocinonide cream (LIDEX) 0.25 % Apply 1 application topically 2 (two) times daily.    Historical Provider, MD   BP 133/87 mmHg  Pulse 68  Temp(Src) 97.8 F (36.6 C) (Oral)  Resp 21  Ht 4\' 10"  (1.473 m)  Wt 210 lb (95.255 kg)  BMI 43.90 kg/m2  SpO2 98% Physical Exam  Constitutional: She is oriented to person, place, and time. She appears well-developed and well-nourished.  HENT:  Head: Normocephalic and atraumatic.  Mouth/Throat: Oropharynx is clear and moist.  Eyes: Conjunctivae and EOM are normal. Pupils are equal, round, and reactive to light.  Neck: Normal range of motion. Neck supple.  Cardiovascular: Normal rate, regular rhythm and intact distal pulses.   Pulmonary/Chest: Effort normal and breath sounds normal. No respiratory distress. She has no wheezes. She has no rales.  Abdominal: Soft. Bowel sounds are normal. There is no tenderness. There is no rebound and no guarding.  Musculoskeletal: Normal range of motion.  Neurological: She is alert and oriented to person, place, and time.  Skin: Skin is warm and dry. She is not diaphoretic.  Psychiatric: She has a normal mood and affect.    ED Course  Procedures (including critical care time) Labs Review Labs Reviewed  CBC WITH DIFFERENTIAL/PLATELET - Abnormal; Notable for the following:    WBC 12.5 (*)    RBC 3.66 (*)    Hemoglobin 11.1 (*)    HCT 34.3 (*)    Neutro Abs 8.4 (*)    All other components within normal limits  I-STAT CHEM 8, ED - Abnormal; Notable for the following:    BUN 26 (*)    Creatinine, Ser 1.20 (*)    Glucose, Bld 101 (*)    Hemoglobin 11.6 (*)    HCT 34.0 (*)    All other components within normal limits  I-STAT TROPOININ, ED    Imaging Review No  results found. I have personally reviewed and evaluated these images and lab results as part of my medical decision-making.   EKG Interpretation   Date/Time:  Wednesday December 19 2014 04:28:01 EDT Ventricular Rate:  67 PR Interval:  195 QRS Duration: 87 QT Interval:  423 QTC Calculation: 446 R Axis:   0 Text Interpretation:  Sinus rhythm Low voltage, precordial leads Confirmed  by [facility]  MD, Stony Stegmann ([id number]) on 12/19/2014 5:41:51 AM      MDM   Final diagnoses:  None    Results for orders placed or performed during the hospital encounter of 12/19/14  CBC with Differential/Platelet  Result Value Ref Range  WBC 12.5 (H) 4.0 - 10.5 K/uL   RBC 3.66 (L) 3.87 - 5.11 MIL/uL   Hemoglobin 11.1 (L) 12.0 - 15.0 g/dL   HCT 34.3 (L) 36.0 - 46.0 %   MCV 93.7 78.0 - 100.0 fL   MCH 30.3 26.0 - 34.0 pg   MCHC 32.4 30.0 - 36.0 g/dL   RDW 13.9 11.5 - 15.5 %   Platelets 248 150 - 400 K/uL   Neutrophils Relative % 67 %   Neutro Abs 8.4 (H) 1.7 - 7.7 K/uL   Lymphocytes Relative 23 %   Lymphs Abs 2.9 0.7 - 4.0 K/uL   Monocytes Relative 6 %   Monocytes Absolute 0.7 0.1 - 1.0 K/uL   Eosinophils Relative 4 %   Eosinophils Absolute 0.5 0.0 - 0.7 K/uL   Basophils Relative 0 %   Basophils Absolute 0.0 0.0 - 0.1 K/uL  I-stat troponin, ED  Result Value Ref Range   Troponin i, poc 0.00 0.00 - 0.08 ng/mL   Comment 3          I-stat chem 8, ed  Result Value Ref Range   Sodium 140 135 - 145 mmol/L   Potassium 4.5 3.5 - 5.1 mmol/L   Chloride 105 101 - 111 mmol/L   BUN 26 (H) 6 - 20 mg/dL   Creatinine, Ser 1.20 (H) 0.44 - 1.00 mg/dL   Glucose, Bld 101 (H) 65 - 99 mg/dL   Calcium, Ion 1.14 1.13 - 1.30 mmol/L   TCO2 24 0 - 100 mmol/L   Hemoglobin 11.6 (L) 12.0 - 15.0 g/dL   HCT 34.0 (L) 36.0 - 46.0 %   No results found.  Medications  nitroGLYCERIN (NITROSTAT) SL tablet 0.4 mg (not administered)  aspirin chewable tablet 81 mg (not administered)    Will need admission for cardiac  work up   Kataleah Bejar, MD 12/19/14 [phone]

## 2014-12-19 NOTE — Care Management Note (Signed)
Case Management Note  Patient Details  Name: Erin Ingram MRN: [id number] Date of Birth: 12-04-1940  Subjective/Objective:   Pt admitted for chest pain.                  Action/Plan: CM will continue to monitor for disposition needs.    Expected Discharge Date:                  Expected Discharge Plan:  Home/Self Care  In-House Referral:     Discharge planning Services  CM Consult  Post Acute Care Choice:    Choice offered to:     DME Arranged:    DME Agency:     HH Arranged:    HH Agency:     Status of Service:  In process, will continue to follow  Medicare Important Message Given:    Date Medicare IM Given:    Medicare IM give by:    Date Additional Medicare IM Given:    Additional Medicare Important Message give by:     If discussed at [place] of Stay Meetings, dates discussed:    Additional Comments:  Bethena Roys, RN 12/19/2014, 11:09 AM

## 2014-12-19 NOTE — Progress Notes (Signed)
UR Completed Vita Currin Graves-Bigelow, RN,BSN [phone]  

## 2014-12-20 ENCOUNTER — Encounter (HOSPITAL_COMMUNITY): Payer: Self-pay | Admitting: Interventional Cardiology

## 2014-12-20 DIAGNOSIS — I10 Essential (primary) hypertension: Secondary | ICD-10-CM

## 2014-12-20 DIAGNOSIS — Z951 Presence of aortocoronary bypass graft: Secondary | ICD-10-CM

## 2014-12-20 DIAGNOSIS — E782 Mixed hyperlipidemia: Secondary | ICD-10-CM

## 2014-12-20 LAB — BASIC METABOLIC PANEL
Anion gap: 8 (ref 5–15)
BUN: 16 mg/dL (ref 6–20)
CO2: 24 mmol/L (ref 22–32)
Calcium: 8.7 mg/dL — ABNORMAL LOW (ref 8.9–10.3)
Chloride: 109 mmol/L (ref 101–111)
Creatinine, Ser: 1.12 mg/dL — ABNORMAL HIGH (ref 0.44–1.00)
GFR calc Af Amer: 54 mL/min — ABNORMAL LOW (ref >60)
GFR calc non Af Amer: 47 mL/min — ABNORMAL LOW (ref >60)
Glucose, Bld: 93 mg/dL (ref 65–99)
Potassium: 4.4 mmol/L (ref 3.5–5.1)
Sodium: 141 mmol/L (ref 135–145)

## 2014-12-20 LAB — CBC
HCT: 32.8 % — ABNORMAL LOW (ref 36.0–46.0)
Hemoglobin: 10.5 g/dL — ABNORMAL LOW (ref 12.0–15.0)
MCH: 30.2 pg (ref 26.0–34.0)
MCHC: 32 g/dL (ref 30.0–36.0)
MCV: 94.3 fL (ref 78.0–100.0)
Platelets: 227 10*3/uL (ref 150–400)
RBC: 3.48 MIL/uL — ABNORMAL LOW (ref 3.87–5.11)
RDW: 14.1 % (ref 11.5–15.5)
WBC: 9.8 10*3/uL (ref 4.0–10.5)

## 2014-12-20 LAB — HEMOGLOBIN A1C
Hgb A1c MFr Bld: 5.7 % — ABNORMAL HIGH (ref 4.8–5.6)
Mean Plasma Glucose: 117 mg/dL

## 2014-12-20 MED ORDER — TICAGRELOR 90 MG PO TABS: 90.0000 mg | ORAL_TABLET | Freq: Two times a day (BID) | ORAL | Status: DC

## 2014-12-20 MED ORDER — NITROGLYCERIN 0.4 MG SL SUBL: 0.4000 mg | SUBLINGUAL_TABLET | SUBLINGUAL | Status: DC | PRN

## 2014-12-20 MED ORDER — ASPIRIN 81 MG PO CHEW: 81.0000 mg | CHEWABLE_TABLET | Freq: Every day | ORAL | Status: DC

## 2014-12-20 NOTE — Progress Notes (Signed)
Patient Name: Erin Ingram Date of Encounter: 12/20/2014  Primary Cardiologist: Dr. Irish Lack   Principal Problem:   Unstable angina Active Problems:   Mixed hyperlipidemia   Morbid obesity   Postsurgical aortocoronary bypass status   Essential hypertension   CAD (coronary artery disease) of bypass graft    SUBJECTIVE  Had recurrent CP this morning lasting a few min, now resolved. EKG obtained after CP resolved, no changes  CURRENT MEDS . amLODipine  2.5 mg Oral Daily  . aspirin  81 mg Oral Daily  . atorvastatin  10 mg Oral Daily  . cholecalciferol  1,000 Units Oral Daily  . heparin  5,000 Units Subcutaneous 3 times per day  . levothyroxine  75 mcg Oral QAC breakfast  . loratadine  10 mg Oral q1800  . metoprolol succinate  100 mg Oral Daily  . omega-3 acid ethyl esters  1 g Oral Daily  . predniSONE  1 mg Oral Q breakfast  . psyllium  1 packet Oral Daily  . ranolazine  1,000 mg Oral BID  . sodium chloride  3 mL Intravenous Q12H  . ticagrelor  90 mg Oral BID  . venlafaxine  225 mg Oral Daily    OBJECTIVE  Filed Vitals:   12/20/14 0236 12/20/14 0247 12/20/14 0621 12/20/14 0631  BP: 166/54 123/41 145/49 137/45  Pulse: 67  71 65  Temp: 98.6 F (37 C)     TempSrc: Oral     Resp: 25  18 19   Height:      Weight: 217 lb 2.5 oz (98.5 kg)     SpO2: 99%  100% 100%    Intake/Output Summary (Last 24 hours) at 12/20/14 0807 Last data filed at 12/20/14 0526  Gross per 24 hour  Intake 1336.8 ml  Output   3200 ml  Net -1863.2 ml   Filed Weights   12/19/14 0431 12/20/14 0236  Weight: 210 lb (95.255 kg) 217 lb 2.5 oz (98.5 kg)    PHYSICAL EXAM  General: Pleasant, NAD. Neuro: Alert and oriented X 3. Moves all extremities spontaneously. Psych: Normal affect. HEENT:  Normal  Neck: Supple without bruits or JVD. Lungs:  Resp regular and unlabored, CTA. Heart: RRR no s3, s4, or murmurs. L radial and R femoral cath site stable, no bleeding or significant  hematoma Abdomen: Soft, non-tender, non-distended, BS + x 4.  Extremities: No clubbing, cyanosis. DP/PT/Radials 2+ and equal bilaterally. Trace edema in bilateral LE.  Accessory Clinical Findings  CBC  Recent Labs  12/19/14 0430  12/19/14 1620 12/20/14 0223  WBC 12.5*  --  8.3 9.8  NEUTROABS 8.4*  --   --   --   HGB 11.1*  < > 10.5* 10.5*  HCT 34.3*  < > 32.7* 32.8*  MCV 93.7  --  93.7 94.3  PLT 248  --  229 227  < > = values in this interval not displayed. Basic Metabolic Panel  Recent Labs  12/19/14 0434 12/19/14 1620 12/20/14 0223  NA 140  --  141  K 4.5  --  4.4  CL 105  --  109  CO2  --   --  24  GLUCOSE 101*  --  93  BUN 26*  --  16  CREATININE 1.20* 1.04* 1.12*  CALCIUM  --   --  8.7*   Cardiac Enzymes  Recent Labs  12/19/14 0940 12/19/14 1620 12/19/14 2052  TROPONINI <0.03 <0.03 0.03   Hemoglobin A1C  Recent Labs  12/19/14 0940  HGBA1C 5.7*   Thyroid Function Tests  Recent Labs  12/19/14 0940  TSH 2.564    TELE NSR with HR 60s    ECG  NSR with low voltage, no significant ST-T wave changes  Radiology/Studies  Dg Chest 2 View  12/19/2014   CLINICAL DATA:  Initial valuation for acute chest pain radiating into back were shortness of breath.  EXAM: CHEST  2 VIEW  COMPARISON:  Prior radiograph from 04/09/2006.  FINDINGS: Median sternotomy wires underlying CABG markers and surgical clips present. Cardiomegaly noted. Prominent atheromatous plaque within the aortic arch.  Lungs are normally inflated. No focal infiltrate identified. Mild diffuse interstitial prominence without overt pulmonary edema. No pleural effusion. No pneumothorax.  No acute osseus abnormality para scatter multilevel degenerative changes present within the visualized spine.  IMPRESSION: 1. No active cardiopulmonary disease. 2. Cardiomegaly with sequela of prior CABG.   Electronically Signed   By: Jeannine Boga M.D.   On: 12/19/2014 05:45    ASSESSMENT AND PLAN  74  yo WF with PMH of CAD s/p CABG 2000 (SVG/ free LIMA Y graft to the diagonal and distal LAD, SVG to the OM 1, and SVG to the PDA), OSA on CPAP, HLD presented with increasing frequency of angina.   1. Unstable angina  - s/p CAth 12/19/2014 90% distal SVG to RCA lesion treated with 2 overlapping DES (3.5 x 28 and 3.5 x 8 mm Synergy)  - continue ASA, brilinta, metoprolol XL, ranexa, low dose lipitor.  - unclear what caused the CP this morning, no EKG changes noted, will ambulate with cardiac rehab today, if no recurrent CP with ambulation, likely discharge today.    - trace edema in bilateral LE, on chronic steroid, lung clear on exam, no suspicion for HF  2. CAD s/p CABG 2000 (SVG/ free LIMA Y graft to the diagonal and distal LAD, SVG to the OM 1, and SVG to the PDA)  3. OSA on CPAP 4. HLD 5. Left subclavian stenosis prevent L radial access during cath 6. Prediabetes: A1C 5.7  Signed, Almyra Deforest PA-C Pager: [phone]   I have seen, examined and evaluated the patient this AM along with Mr. Delrae Sawyers.  After reviewing all the available data and chart,  I agree with his findings, examination as well as impression recommendations.  Admitted with SSx of ACS/Unstable Angina -- had PCI of SVG-RCA (required 2nd stent for distal edge tear) -- had mild discomfort this AM that was short-lived.  No Sx with ambulation during [place] evaluations. NO EKC changes -- otherwise stable.  She has chronic stable angina - but this was not as intense or typical in comparison to her usual angina (especiallyl to yesterday's episode that was intense & unrelenting). -- already on Ranexa 1000 mg bid (could consider Imdur as OP) -- she plans to return for Phase 2 [facility] as OP.  BP has been well controlled. - ON BB & CCB.  On DAPT - CM assisting with ensuring Brilinta availability & cost concerns.  OK for d/c today - close f/u with APP.  [place] referral Phase 2. Then f/u with Dr. Irish Lack.   Leonie Man, M.D.,  M.S. Interventional Cardiologist   Pager # [phone]

## 2014-12-20 NOTE — Progress Notes (Signed)
Patient c/o chest and arm discomfort 3/10 vitals stable. SL nitro x1 given with relief. MD to be here on the unit soon will have him review EKG assess patient.

## 2014-12-20 NOTE — Progress Notes (Signed)
CARDIAC REHAB PHASE I   PRE:  Rate/Rhythm: 70 SR  BP:  Supine:   Sitting: 106/39  Standing:    SaO2:   MODE:  Ambulation: 400 ft   POST:  Rate/Rhythm: 82 SR  BP:  Supine:   Sitting: 152/27  Standing:    SaO2:  0825-0940 Pt walked 400 ft with her husband's rollator without CP. Tolerated well. Education completed with pt and husband who voiced understanding. Discussed importance of brilinta with stent. Discussed CRP 2 and referring to [facility]. Has done program before. Pt stated on steroids and that has affected her A1C.   Graylon Good, RN BSN  12/20/2014 9:35 AM

## 2014-12-20 NOTE — Discharge Instructions (Signed)
No driving for 48 hours. No lifting over 5 lbs for 1 week. No sexual activity for 1 week. Keep procedure site clean & dry. If you notice increased pain, swelling, bleeding or pus, call/return!  You may shower, but no soaking baths/hot tubs/pools for 1 week.    Angina Pectoris Angina pectoris is extreme discomfort in your chest, neck, or arm. Your doctor may call it just angina. It is caused by a lack of oxygen to your heart wall. It may feel like tightness or heavy pressure. It may feel like a crushing or squeezing pain. Some people say it feels like gas. It may go down your shoulders, back, and arms. Some people have symptoms other than pain. These include:  Tiredness.  Shortness of breath.  Cold sweats.  Feeling sick to your stomach (nausea). There are four types of angina:  Stable angina. This type often lasts the same amount of time each time it happens. Activity, stress, or excitement can bring it on. It often gets better after taking a medicine called nitroglycerin. This goes under your tongue.  Unstable angina. This type can happen when you are not active or even during sleep. It can suddenly get worse or happen more often. It may not get better after taking the special medicine. It can last up to 30 minutes.  Microvascular angina. This type is more common in women. It may be more severe or last longer than other types.  Prinzmetal angina. This type often happens when you are not active or in the early morning hours. HOME CARE   Only take medicines as told by your doctor.  Stay active or exercise more as told by your doctor.  Limit very hard activity as told by your doctor.  Limit heavy lifting as told by your doctor.  Keep a healthy weight.  Learn about and eat foods that are healthy for your heart.  Do not use any tobacco such as cigarettes, chewing tobacco, or e-cigarettes. GET HELP RIGHT AWAY IF:   You have chest, neck, deep shoulder, or arm pain or discomfort that  lasts more than a few minutes.  You have chest, neck, deep shoulder, or arm pain or discomfort that goes away and comes back over and over again.  You have heavy sweating that seems to happen for no reason.  You have shortness of breath or trouble breathing.  Your angina does not get better after a few minutes of rest.  Your angina does not get better after you take nitroglycerin medicine. These can all be symptoms of a heart attack. Get help right away. Call your local emergency service (911 in [place] Do not  drive yourself to the hospital. Do not  wait to for your symptoms to go away. MAKE SURE YOU:   Understand these instructions.  Will watch your condition.  Will get help right away if you are not doing well or get worse. Document Released: 08/26/2007 Document Revised: 03/14/2013 Document Reviewed: 07/11/2013 [facility] Patient Information 2015 [place], [place]. This information is not intended to replace advice given to you by your health care provider. Make sure you discuss any questions you have with your health care provider.

## 2014-12-20 NOTE — Progress Notes (Signed)
Visited patients room set up on Cpap 13 CM H20 (this is patients home settings) fitted patient with nasal mask.  Patient tried on and was unable to tolerate at this time.  Machine left in room in case patient changes her mind through out the night.

## 2014-12-20 NOTE — Discharge Summary (Signed)
Discharge Summary   Patient ID: Erin Ingram,  MRN: [id number], DOB/AGE: Oct 16, 1940 74 y.o.  Admit date: 12/19/2014 Discharge date: 12/20/2014  Primary Care Provider: [name] Primary Cardiologist: Dr. Irish Lack  Discharge Diagnoses Principal Problem:   Unstable angina Active Problems:   Mixed hyperlipidemia   Morbid obesity   Postsurgical aortocoronary bypass status   Essential hypertension   CAD (coronary artery disease) of bypass graft   Allergies Allergies  Allergen Reactions  . Bisoprolol   . Codeine   . Demerol [Meperidine]   . Imipramine   . Septra [Sulfamethoxazole-Trimethoprim]   . Stadol [Butorphanol]   . Statins     MYALGIAS  . Tequin [Gatifloxacin]     Procedures  Cardiac catheterization 12/19/2014 Conclusion     Severe native three vessel CAD.  Y graft with LIMA to LAD and SVG to diagonal. LIMA portion is patent while vein graft to diagonal is occluded.  The SVG to OM is widely patent. The SVG to RCA has a 90% distal lesion.  The SVG to RCA lesion in the distal graft was treated with overlapping drug-eluting stents, 3.5 x 28 and 3.5 x 8 mm Synergy stents.  Continue dual antiplatelet therapy for at least a year. She'll be watched overnight. Will continue IV Angiomax for a few hours while the Brilinta is becoming therapeutic. Plan for discharge tomorrow. She will benefit from aggressive secondary prevention including cardiac rehabilitation, blood pressure control and weight loss.  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       Hospital Course  The patient is a pleasant 74 year old Caucasian female with past medical history of CAD s/p s/p CABG 2000 (SVG/ free LIMA Y graft to the diagonal and distal LAD, SVG to the OM 1, and SVG to the PDA), OSA on CPAP, HLD, L subclavian stenosis who [facility]/2014 for evaluation of chest pain. Patient has chronic angina over the years and  was treated with nitrates, amlodipine, metoprolol and the Ranexa. Historically, she has angina 2-4 times a month where she takes sublingual nitroglycerin. However for the past 2 months, she has noticing increasing symptom of angina in frequency and severity. She also noted shortness of breath with exertion. Her symptom was concerning for unstable angina, after discussing various options, she agreed to undergo diagnostic cardiac catheterization. Cardiac cath performed on 12/19/2014 showed 90% distal SVG to RCA lesion treated with 2 overlapping DES (3.5 x 28 and 3.5 x 8 mm Synergy). Of note, initially, cardiac cath was attempted from the left radial side, however the wire could not be advanced due to subclavian stenosis, eventually cath was successful via right femoral approach.   She was placed on Brilinta along with aspirin, Toprol-XL, Ranexa and low dose Lipitor. She was seen on the following morning on 9/29, at which time she had one episode of chest discomfort lasting 1 minute long, otherwise she has been doing well overnight. She has trace amount of edema in bilateral lower extremity which is likely related to chronic steroid-induced, her lung is clear on exam, there was no suspicion for heart failure. She ambulated with cardiac rehabilitation around 400 feet without significant discomfort. She is deemed stable for discharge from cardiology perspective. Emphasis has been placed on compliance with aspirin and Brilinta. Two to 4 weeks outpatient follow-up has been arranged. She has been advised to lift no greater than 5 pounds for one week.   Discharge Vitals Blood pressure 119/57, pulse 65, temperature 98.2  F (36.8 C), temperature source Oral, resp. rate 18, height 4\' 10"  (1.473 m), weight 217 lb 2.5 oz (98.5 kg), SpO2 100 %.  Filed Weights   12/19/14 0431 12/20/14 0236  Weight: 210 lb (95.255 kg) 217 lb 2.5 oz (98.5 kg)    Labs  CBC  Recent Labs  12/19/14 0430  12/19/14 1620 12/20/14 0223    WBC 12.5*  --  8.3 9.8  NEUTROABS 8.4*  --   --   --   HGB 11.1*  < > 10.5* 10.5*  HCT 34.3*  < > 32.7* 32.8*  MCV 93.7  --  93.7 94.3  PLT 248  --  229 227  < > = values in this interval not displayed. Basic Metabolic Panel  Recent Labs  12/19/14 0434 12/19/14 1620 12/20/14 0223  NA 140  --  141  K 4.5  --  4.4  CL 105  --  109  CO2  --   --  24  GLUCOSE 101*  --  93  BUN 26*  --  16  CREATININE 1.20* 1.04* 1.12*  CALCIUM  --   --  8.7*   Cardiac Enzymes  Recent Labs  12/19/14 0940 12/19/14 1620 12/19/14 2052  TROPONINI <0.03 <0.03 0.03   Hemoglobin A1C  Recent Labs  12/19/14 0940  HGBA1C 5.7*   Thyroid Function Tests  Recent Labs  12/19/14 0940  TSH 2.564    Disposition  Pt is being discharged home today in good condition.  Follow-up Plans & Appointments      Follow-up Information    Follow up with Richardson Dopp, PA-C On 01/10/2015.   Specialties:  Physician Assistant, Radiology, Interventional Cardiology   Why:  10:10am   Contact information:   [address] [place] [address] [phone]       Discharge Medications    Medication List    STOP taking these medications        aspirin 325 MG tablet  Replaced by:  aspirin 81 MG chewable tablet      TAKE these medications        amLODipine 2.5 MG tablet  Commonly known as:  NORVASC  Take 1 tablet (2.5 mg total) by mouth daily.     aspirin 81 MG chewable tablet  Chew 1 tablet (81 mg total) by mouth daily.     atorvastatin 10 MG tablet  Commonly known as:  LIPITOR  Take 10 mg by mouth daily.     Coenzyme Q10 10 MG capsule  Take 10 mg by mouth daily.     FISH OIL PO  Take 1 tablet by mouth daily.     fluocinonide cream 0.05 %  Commonly known as:  LIDEX  Apply 1 application topically 2 (two) times daily.     fluticasone 50 MCG/ACT nasal spray  Commonly known as:  FLONASE  Place 2 sprays into both nostrils daily.     isosorbide mononitrate 60 MG 24  hr tablet  Commonly known as:  IMDUR  Take 2 tablets by mouth  once a day     levocetirizine 5 MG tablet  Commonly known as:  XYZAL  Take 5 mg by mouth every evening.     levothyroxine 75 MCG tablet  Commonly known as:  SYNTHROID, LEVOTHROID  Take 75 mcg by mouth daily before breakfast.     metoprolol succinate 100 MG 24 hr tablet  Commonly known as:  TOPROL-XL  Take 1 tablet by mouth once a  day     nitroGLYCERIN 0.4 MG SL tablet  Commonly known as:  NITROSTAT  Place 0.4 mg under the tongue every 5 (five) minutes as needed for chest pain.     predniSONE 1 MG tablet  Commonly known as:  DELTASONE  Take 1 mg by mouth daily with breakfast.     Psyllium 400 MG Caps  Take 400 mg by mouth daily.     RANEXA 1000 MG SR tablet  Generic drug:  ranolazine  Take 1 tablet by mouth  twice a day     ticagrelor 90 MG Tabs tablet  Commonly known as:  BRILINTA  Take 1 tablet (90 mg total) by mouth 2 (two) times daily.     venlafaxine 75 MG tablet  Commonly known as:  EFFEXOR  Take 225 mg by mouth daily.     VITAMIN D PO  Take 1 tablet by mouth daily.         Duration of Discharge Encounter   Greater than 30 minutes including physician time.  Hilbert Corrigan PA-C Pager: [phone] 12/20/2014, 10:54 AM

## 2014-12-31 ENCOUNTER — Inpatient Hospital Stay (HOSPITAL_COMMUNITY)
Admission: EM | Admit: 2014-12-31 | Discharge: 2015-01-01 | DRG: 287 | Disposition: A | Discharge status: Home / Self Care | Payer: Medicare Other | Attending: Cardiovascular Disease | Admitting: Cardiovascular Disease

## 2014-12-31 ENCOUNTER — Encounter (HOSPITAL_COMMUNITY): Payer: Self-pay | Admitting: Emergency Medicine

## 2014-12-31 ENCOUNTER — Emergency Department (HOSPITAL_COMMUNITY): Payer: Medicare Other

## 2014-12-31 DIAGNOSIS — I200 Unstable angina: Secondary | ICD-10-CM | POA: Diagnosis not present

## 2014-12-31 DIAGNOSIS — Z79899 Other long term (current) drug therapy: Secondary | ICD-10-CM | POA: Diagnosis not present

## 2014-12-31 DIAGNOSIS — Z9841 Cataract extraction status, right eye: Secondary | ICD-10-CM

## 2014-12-31 DIAGNOSIS — I10 Essential (primary) hypertension: Secondary | ICD-10-CM | POA: Diagnosis present

## 2014-12-31 DIAGNOSIS — Z87891 Personal history of nicotine dependence: Secondary | ICD-10-CM

## 2014-12-31 DIAGNOSIS — E782 Mixed hyperlipidemia: Secondary | ICD-10-CM | POA: Diagnosis present

## 2014-12-31 DIAGNOSIS — Y832 Surgical operation with anastomosis, bypass or graft as the cause of abnormal reaction of the patient, or of later complication, without mention of misadventure at the time of the procedure: Secondary | ICD-10-CM | POA: Diagnosis present

## 2014-12-31 DIAGNOSIS — T82898A Other specified complication of vascular prosthetic devices, implants and grafts, initial encounter: Principal | ICD-10-CM | POA: Diagnosis present

## 2014-12-31 DIAGNOSIS — F329 Major depressive disorder, single episode, unspecified: Secondary | ICD-10-CM | POA: Diagnosis present

## 2014-12-31 DIAGNOSIS — Z6841 Body Mass Index (BMI) 40.0 and over, adult: Secondary | ICD-10-CM

## 2014-12-31 DIAGNOSIS — I25110 Atherosclerotic heart disease of native coronary artery with unstable angina pectoris: Secondary | ICD-10-CM | POA: Diagnosis present

## 2014-12-31 DIAGNOSIS — Z9842 Cataract extraction status, left eye: Secondary | ICD-10-CM

## 2014-12-31 DIAGNOSIS — Z7982 Long term (current) use of aspirin: Secondary | ICD-10-CM

## 2014-12-31 DIAGNOSIS — M353 Polymyalgia rheumatica: Secondary | ICD-10-CM | POA: Diagnosis present

## 2014-12-31 DIAGNOSIS — G4733 Obstructive sleep apnea (adult) (pediatric): Secondary | ICD-10-CM | POA: Diagnosis present

## 2014-12-31 DIAGNOSIS — R079 Chest pain, unspecified: Secondary | ICD-10-CM | POA: Diagnosis not present

## 2014-12-31 DIAGNOSIS — I2510 Atherosclerotic heart disease of native coronary artery without angina pectoris: Secondary | ICD-10-CM | POA: Diagnosis not present

## 2014-12-31 DIAGNOSIS — M25511 Pain in right shoulder: Secondary | ICD-10-CM | POA: Diagnosis present

## 2014-12-31 DIAGNOSIS — I25700 Atherosclerosis of coronary artery bypass graft(s), unspecified, with unstable angina pectoris: Secondary | ICD-10-CM

## 2014-12-31 LAB — BASIC METABOLIC PANEL
Anion gap: 9 (ref 5–15)
BUN: 23 mg/dL — ABNORMAL HIGH (ref 6–20)
CO2: 22 mmol/L (ref 22–32)
Calcium: 8.9 mg/dL (ref 8.9–10.3)
Chloride: 107 mmol/L (ref 101–111)
Creatinine, Ser: 1.34 mg/dL — ABNORMAL HIGH (ref 0.44–1.00)
GFR calc Af Amer: 44 mL/min — ABNORMAL LOW (ref >60)
GFR calc non Af Amer: 38 mL/min — ABNORMAL LOW (ref >60)
Glucose, Bld: 106 mg/dL — ABNORMAL HIGH (ref 65–99)
Potassium: 4 mmol/L (ref 3.5–5.1)
Sodium: 138 mmol/L (ref 135–145)

## 2014-12-31 LAB — I-STAT TROPONIN, ED: Troponin i, poc: 0 ng/mL (ref 0.00–0.08)

## 2014-12-31 LAB — PROTIME-INR
INR: 1.09 (ref 0.00–1.49)
Prothrombin Time: 14.3 seconds (ref 11.6–15.2)

## 2014-12-31 LAB — CBC
HCT: 31.7 % — ABNORMAL LOW (ref 36.0–46.0)
Hemoglobin: 10.5 g/dL — ABNORMAL LOW (ref 12.0–15.0)
MCH: 31.1 pg (ref 26.0–34.0)
MCHC: 33.1 g/dL (ref 30.0–36.0)
MCV: 93.8 fL (ref 78.0–100.0)
Platelets: 261 10*3/uL (ref 150–400)
RBC: 3.38 MIL/uL — ABNORMAL LOW (ref 3.87–5.11)
RDW: 14 % (ref 11.5–15.5)
WBC: 11.7 10*3/uL — ABNORMAL HIGH (ref 4.0–10.5)

## 2014-12-31 LAB — TSH
TSH: 3.247 u[IU]/mL (ref 0.350–4.500)
TSH: 2.857 u[IU]/mL (ref 0.350–4.500)

## 2014-12-31 LAB — TROPONIN I
Troponin I: <0.03 ng/mL (ref <0.031)
Troponin I: <0.03 ng/mL (ref <0.031)

## 2014-12-31 LAB — GLUCOSE, CAPILLARY: Glucose-Capillary: 128 mg/dL — ABNORMAL HIGH (ref 65–99)

## 2014-12-31 LAB — HEPARIN LEVEL (UNFRACTIONATED): Heparin Unfractionated: 0.36 IU/mL (ref 0.30–0.70)

## 2014-12-31 MED ORDER — FLUTICASONE PROPIONATE 50 MCG/ACT NA SUSP
2.0000 | Freq: Every day | NASAL | Status: DC | PRN
  Filled 2014-12-31: qty 16

## 2014-12-31 MED ORDER — SODIUM CHLORIDE 0.9 % IJ SOLN: 3.0000 mL | INTRAMUSCULAR | Status: DC | PRN

## 2014-12-31 MED ORDER — NITROGLYCERIN 2 % TD OINT
0.5000 [in_us] | TOPICAL_OINTMENT | Freq: Four times a day (QID) | TRANSDERMAL | Status: DC
  Administered 2014-12-31 – 2015-01-01 (×4): 0.5 [in_us] via TOPICAL
  Filled 2014-12-31: qty 30

## 2014-12-31 MED ORDER — SODIUM CHLORIDE 0.9 % IV SOLN
INTRAVENOUS | Status: DC
  Administered 2014-12-31: 18:00:00 via INTRAVENOUS

## 2014-12-31 MED ORDER — ACETAMINOPHEN 325 MG PO TABS: 650.0000 mg | ORAL_TABLET | ORAL | Status: DC | PRN

## 2014-12-31 MED ORDER — PREDNISONE 1 MG PO TABS
1.0000 mg | ORAL_TABLET | Freq: Every day | ORAL | Status: DC
  Administered 2015-01-01: 1 mg via ORAL
  Filled 2014-12-31 (×2): qty 1

## 2014-12-31 MED ORDER — PSYLLIUM 400 MG PO CAPS
400.0000 mg | ORAL_CAPSULE | Freq: Every day | ORAL | Status: DC
Start: 2014-12-31 — End: 2014-12-31

## 2014-12-31 MED ORDER — ALPRAZOLAM 0.25 MG PO TABS: 0.2500 mg | ORAL_TABLET | Freq: Two times a day (BID) | ORAL | Status: DC | PRN

## 2014-12-31 MED ORDER — ONDANSETRON HCL 4 MG/2ML IJ SOLN: 4.0000 mg | Freq: Four times a day (QID) | INTRAMUSCULAR | Status: DC | PRN

## 2014-12-31 MED ORDER — NITROGLYCERIN 0.4 MG SL SUBL: 0.4000 mg | SUBLINGUAL_TABLET | SUBLINGUAL | Status: DC | PRN

## 2014-12-31 MED ORDER — VENLAFAXINE HCL 75 MG PO TABS
75.0000 mg | ORAL_TABLET | Freq: Three times a day (TID) | ORAL | Status: DC
  Filled 2014-12-31 (×3): qty 1

## 2014-12-31 MED ORDER — LEVOCETIRIZINE DIHYDROCHLORIDE 5 MG PO TABS: 5.0000 mg | ORAL_TABLET | Freq: Every evening | ORAL | Status: DC

## 2014-12-31 MED ORDER — SODIUM CHLORIDE 0.9 % IV SOLN: 250.0000 mL | INTRAVENOUS | Status: DC | PRN

## 2014-12-31 MED ORDER — VITAMIN D 1000 UNITS PO TABS
1000.0000 [IU] | ORAL_TABLET | Freq: Every day | ORAL | Status: DC
  Administered 2015-01-01: 1000 [IU] via ORAL
  Filled 2014-12-31: qty 1

## 2014-12-31 MED ORDER — ASPIRIN 81 MG PO CHEW
81.0000 mg | CHEWABLE_TABLET | Freq: Every day | ORAL | Status: DC
  Administered 2014-12-31 – 2015-01-01 (×2): 81 mg via ORAL
  Filled 2014-12-31 (×2): qty 1

## 2014-12-31 MED ORDER — ASPIRIN 81 MG PO CHEW
81.0000 mg | CHEWABLE_TABLET | ORAL | Status: AC
  Administered 2015-01-01: 81 mg via ORAL
  Filled 2014-12-31: qty 1

## 2014-12-31 MED ORDER — SODIUM CHLORIDE 0.9 % IJ SOLN
3.0000 mL | Freq: Two times a day (BID) | INTRAMUSCULAR | Status: DC
  Administered 2015-01-01: 3 mL via INTRAVENOUS

## 2014-12-31 MED ORDER — AMLODIPINE BESYLATE 2.5 MG PO TABS
2.5000 mg | ORAL_TABLET | Freq: Every day | ORAL | Status: DC
  Administered 2014-12-31 – 2015-01-01 (×2): 2.5 mg via ORAL
  Filled 2014-12-31 (×2): qty 1

## 2014-12-31 MED ORDER — COENZYME Q10 10 MG PO CAPS: 10.0000 mg | ORAL_CAPSULE | Freq: Every day | ORAL | Status: DC

## 2014-12-31 MED ORDER — CALCIUM POLYCARBOPHIL 625 MG PO TABS
625.0000 mg | ORAL_TABLET | Freq: Every day | ORAL | Status: DC
  Administered 2015-01-01: 625 mg via ORAL
  Filled 2014-12-31 (×2): qty 1

## 2014-12-31 MED ORDER — TICAGRELOR 90 MG PO TABS
90.0000 mg | ORAL_TABLET | Freq: Two times a day (BID) | ORAL | Status: DC
  Administered 2014-12-31 – 2015-01-01 (×3): 90 mg via ORAL
  Filled 2014-12-31 (×3): qty 1

## 2014-12-31 MED ORDER — HEPARIN (PORCINE) IN NACL 100-0.45 UNIT/ML-% IJ SOLN
1000.0000 [IU]/h | INTRAMUSCULAR | Status: DC
  Administered 2014-12-31: 850 [IU]/h via INTRAVENOUS
  Filled 2014-12-31: qty 250

## 2014-12-31 MED ORDER — LORATADINE 10 MG PO TABS
10.0000 mg | ORAL_TABLET | Freq: Every day | ORAL | Status: DC
  Administered 2015-01-01: 10 mg via ORAL
  Filled 2014-12-31: qty 1

## 2014-12-31 MED ORDER — SODIUM CHLORIDE 0.9 % IV SOLN
INTRAVENOUS | Status: DC
  Administered 2014-12-31: 50 mL/h via INTRAVENOUS

## 2014-12-31 MED ORDER — ATORVASTATIN CALCIUM 10 MG PO TABS
10.0000 mg | ORAL_TABLET | Freq: Every day | ORAL | Status: DC
  Administered 2015-01-01: 10 mg via ORAL
  Filled 2014-12-31: qty 1

## 2014-12-31 MED ORDER — ZOLPIDEM TARTRATE 5 MG PO TABS: 5.0000 mg | ORAL_TABLET | Freq: Every evening | ORAL | Status: DC | PRN

## 2014-12-31 MED ORDER — HEPARIN BOLUS VIA INFUSION
4000.0000 [IU] | Freq: Once | INTRAVENOUS | Status: AC
  Administered 2014-12-31: 4000 [IU] via INTRAVENOUS
  Filled 2014-12-31: qty 4000

## 2014-12-31 MED ORDER — METOPROLOL SUCCINATE ER 100 MG PO TB24
100.0000 mg | ORAL_TABLET | Freq: Every day | ORAL | Status: DC
  Administered 2014-12-31 – 2015-01-01 (×2): 100 mg via ORAL
  Filled 2014-12-31 (×2): qty 1

## 2014-12-31 MED ORDER — LEVOTHYROXINE SODIUM 75 MCG PO TABS
75.0000 ug | ORAL_TABLET | Freq: Every evening | ORAL | Status: DC
  Administered 2014-12-31: 75 ug via ORAL
  Filled 2014-12-31: qty 1

## 2014-12-31 MED ORDER — RANOLAZINE ER 500 MG PO TB12
1000.0000 mg | ORAL_TABLET | Freq: Two times a day (BID) | ORAL | Status: DC
  Administered 2014-12-31 – 2015-01-01 (×3): 1000 mg via ORAL
  Filled 2014-12-31 (×3): qty 2

## 2014-12-31 MED ORDER — VENLAFAXINE HCL ER 75 MG PO CP24
225.0000 mg | ORAL_CAPSULE | Freq: Every day | ORAL | Status: DC
  Administered 2014-12-31 – 2015-01-01 (×2): 225 mg via ORAL
  Filled 2014-12-31 (×3): qty 1

## 2014-12-31 MED ORDER — OMEGA-3-ACID ETHYL ESTERS 1 G PO CAPS
1.0000 g | ORAL_CAPSULE | Freq: Two times a day (BID) | ORAL | Status: DC
  Administered 2014-12-31 – 2015-01-01 (×2): 1 g via ORAL
  Filled 2014-12-31 (×2): qty 1

## 2014-12-31 NOTE — H&P (Signed)
History and Physical   Patient ID: Erin Ingram MRN: [id number], DOB/AGE: 75-Feb-1942 75 y.o. Date of Encounter: 12/31/2014  Primary Physician: [name], MD Primary Cardiologist: Dr Irish Lack  Chief Complaint:  Angina  HPI: Erin Ingram is a 74 y.o. female with a history of CABG in 2000. This included a SVG/ free LIMA Y graft to the diagonal and distal LAD, SVG to the OM 1, and SVG to the PDA. Cath in 2008 showed patent grafts. She has chronic angina over the years and was treated with EECP many years ago. She has been on chronic nitrates, amlodipine, metoprolol, and Ranexa. Historically she has angina about 2-4 times a month where she has to take sl Ntg.   Seen by Dr Martinique 09/28 when she came to the ED with chest pain. Cath with 90% SVG-RCA, s/p DES x 2. D/c 09/29. Her previous angina had been in her L shoulder and arm, but this pain was in her R shoulder and arm. Cath results below.  She initially did well after d/c and had no chest pain. She was feeling well otherwise when she went to bed last pm. She was wakened at 7 am with pain in the back of her R arm, aching and 5/10. It was also in her R shoulder. It felt like the pain from 09/28. She moved around but the pain did not improve. She took SL NTG x 2 and the pain resolved. She had some milder pain since then, but she is also having some MS R shoulder discomfort. The R shoulder pain is not like her angina, but the arm pain is. No associated symptoms. Prior to the last stents, she had significant DOE, but has not noticed this today.   Past Medical History  Diagnosis Date  . Coronary artery disease     a. s/p CABG 2000 (SVG/ free LIMA Y graft to the diagonal and distal LAD, SVG to the OM 1, and SVG to the PDA). Cath 12/19/2014 90% dSVG to RCA s/p 2 overlapping DES (3.5 x 28 and 3.5 x 8 mm Synergy)  . Hyperlipidemia   . OSA on CPAP   . Depression     with anxious component  . Obesity   . Stable angina (HCC)    microvascular, improved with Ranexa  . PMR (polymyalgia rheumatica) [facility])     Surgical History:  Past Surgical History  Procedure Laterality Date  . Cataract extraction Bilateral 2011  . Breast biopsy  1964  . Repair of left patellar  1993  . Cardiac catheterization  08    patent grafts, no culprit lesions, EF 65%  . Coronary artery bypass graft  2000    ASCVD, multivessel, S./P.  Marland Kitchen Cardiac catheterization N/A 12/19/2014    Procedure: Left Heart Cath and Cors/Grafts Angiography;  Surgeon: Jettie Booze, MD;  Location: [facility];  Service: Cardiovascular;  Laterality: N/A;  . Cardiac catheterization N/A 12/19/2014    Procedure: Coronary Stent Intervention;  Surgeon: Jettie Booze, MD;  Location: [place] [facility];  Service: Cardiovascular;  Laterality: N/A;     I have reviewed the patient's current medications. Medication Sig  amLODipine (NORVASC) 2.5 MG tablet Take 1 tablet (2.5 mg total) by mouth daily.  aspirin 81 MG chewable tablet Chew 1 tablet (81 mg total) by mouth daily.  atorvastatin (LIPITOR) 10 MG tablet Take 10 mg by mouth daily.  Cholecalciferol (VITAMIN D PO) Take 1 tablet by mouth daily.  Coenzyme Q10 10 MG capsule Take 10 mg by mouth daily.  fluocinonide cream (LIDEX) 8.09 % Apply 1 application topically 2 (two) times daily.  fluticasone (FLONASE) 50 MCG/ACT nasal spray Place 2 sprays into both nostrils daily.  isosorbide mononitrate (IMDUR) 60 MG 24 hr tablet Take 2 tablets by mouth  once a day  levocetirizine (XYZAL) 5 MG tablet Take 5 mg by mouth every evening.  levothyroxine (SYNTHROID, LEVOTHROID) 75 MCG tablet Take 75 mcg by mouth daily before breakfast.  metoprolol succinate (TOPROL-XL) 100 MG 24 hr tablet Take 1 tablet by mouth once a day  nitroGLYCERIN (NITROSTAT) 0.4 MG SL tablet Place 1 tablet (0.4 mg total) under the tongue every 5 (five) minutes as needed for chest pain.  Omega-3 Fatty Acids (FISH OIL PO) Take 1 tablet by mouth daily.    predniSONE (DELTASONE) 1 MG tablet Take 1 mg by mouth daily with breakfast.  Psyllium 400 MG CAPS Take 400 mg by mouth daily.  RANEXA 1000 MG SR tablet Take 1 tablet by mouth  twice a day  ticagrelor (BRILINTA) 90 MG TABS tablet Take 1 tablet (90 mg total) by mouth 2 (two) times daily.  venlafaxine (EFFEXOR) 75 MG tablet Take 225 mg by mouth daily.    Allergies:  Allergies  Allergen Reactions  . Bisoprolol     unknown  . Codeine Nausea And Vomiting  . Demerol [Meperidine] Nausea And Vomiting  . Imipramine     Sweating, facial dysfunction   . Stadol [Butorphanol]     unknown  . Statins     MYALGIAS  . Tequin [Gatifloxacin]     Low blood sugar  . Septra [Sulfamethoxazole-Trimethoprim] Rash    Social History   Social History  . Marital Status: Married    Spouse Name: N/A  . Number of Children: N/A  . Years of Education: N/A   Occupational History  . Retired Cytogeneticist    Social History Main Topics  . Smoking status: Former Smoker    Quit date: 08/21/1969  . Smokeless tobacco: Never Used  . Alcohol Use: 0.0 oz/week    0 Standard drinks or equivalent per week     Comment: Glass of wine 2-3 x year  . Drug Use: No  . Sexual Activity: Not on file   Other Topics Concern  . Not on file   Social History Narrative   Lives in [place] with husband.     Family History  Problem Relation Age of Onset  . Heart disease Mother   . Heart disease Father   . Diabetes Brother    Family Status  Relation Status Death Age  . Mother Deceased   . Father Deceased   . Brother Alive     Review of Systems:   Full 14-point review of systems otherwise negative except as noted above.  Physical Exam: Blood pressure 115/56, pulse 63, temperature 98.8 F (37.1 C), temperature source Oral, resp. rate 19, SpO2 95 %. General: Well developed, well nourished,female in no acute distress. Head: Normocephalic, atraumatic, sclera non-icteric, no xanthomas, nares are without  discharge. Dentition: poor Neck: No carotid bruits. JVD not elevated, but difficult to assess 2nd body habitus. No thyromegally Lungs: Good expansion bilaterally. without wheezes or rhonchi.  Heart: Regular rate and rhythm with S1 S2.  No S3 or S4.  No murmur, no rubs, or gallops appreciated. Abdomen: Soft, non-tender, non-distended with normoactive bowel sounds. No hepatomegaly. No rebound/guarding. No obvious abdominal masses. Msk:  Strength and tone  appear normal for age. No joint deformities or effusions, no spine or costo-vertebral angle tenderness. Extremities: No clubbing or cyanosis. No edema.  Distal pedal pulses are 2+ in 4 extrem Neuro: Alert and oriented X 3. Moves all extremities spontaneously. No focal deficits noted. Psych:  Responds to questions appropriately with a normal affect. Skin: No rashes or lesions noted  Labs:   Lab Results  Component Value Date   WBC 11.7* 12/31/2014   HGB 10.5* 12/31/2014   HCT 31.7* 12/31/2014   MCV 93.8 12/31/2014   PLT 261 12/31/2014    Recent Labs Lab 12/31/14 0900  NA 138  K 4.0  CL 107  CO2 22  BUN 23*  CREATININE 1.34*  CALCIUM 8.9  GLUCOSE 106*    Recent Labs  12/31/14 0909  TROPIPOC 0.00    Radiology/Studies: Dg Chest 2 View 12/31/2014   CLINICAL DATA:  Right shoulder and back pain, onset this morning, no shortness of breath, nausea vomiting, or diaphoresis. Stent placement last week with similar symptoms, history of CABG.  EXAM: CHEST  2 VIEW  COMPARISON:  PA and lateral chest x-ray of December 19, 2014  FINDINGS: The lungs are adequately inflated. The interstitial markings are coarse but stable. The cardiac silhouette remains enlarged. There are post CABG changes. The pulmonary vascularity is not engorged. There is dense calcification in the aortic arch. The mediastinum is normal in width. There is no pleural effusion or pneumothorax. There is mild multilevel degenerative disc disease of the thoracic spine.   IMPRESSION: Stable cardiomegaly with post CABG changes. There is no CHF, pneumonia, or other acute cardiopulmonary abnormality.   Electronically Signed   By: David  Martinique M.D.   On: 12/31/2014 10:09     Cardiac Cath: 12/19/2014  Severe native three vessel CAD.  Y graft with LIMA to LAD and SVG to diagonal. LIMA portion is patent while vein graft to diagonal is occluded.  The SVG to OM is widely patent. The SVG to RCA has a 90% distal lesion.  The SVG to RCA lesion in the distal graft was treated with overlapping drug-eluting stents, 3.5 x 28 and 3.5 x 8 mm Synergy stents.  No LV gram Continue dual antiplatelet therapy for at least a year. She'll be watched overnight. Will continue IV Angiomax for a few hours while the Brilinta is becoming therapeutic. Plan for discharge tomorrow. She will benefit from aggressive secondary prevention including cardiac rehabilitation, blood pressure control and weight loss.  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   ECG: SR, no acute changes  ASSESSMENT AND PLAN:  Active Problems:   Unstable angina ([place]) - recent PCI, no symptoms until today. - no way to make sure this is not angina except with cath - The risks and benefits of a cardiac catheterization including, but not limited to, death, stroke, MI, kidney damage and bleeding were discussed with the patient who indicates understanding and agrees to proceed.  - Renal function slightly worse than previous, hydrate and cath in am. Not on ACE/ARB, diuretic - no recent LV assessment, EF 60% at cath 2012, ck echo  Otherwise, continue home rx  Augusto Garbe 12/31/2014 11:37 AM Beeper [phone]    Agree with note by Rosaria Ferries PA-C  Pt with known CAD of Dr Hassell Done with recent RCA SVG intervention 2 weeks ago with Synergy DES. Other probs as outlined. Pt was awakened with Right UE discomfort similar to Sx prior to recent intervention. No EKG  changes. Pain waxes and wanes. SCr mildly elevated. On max antianginal Rx. Plan admit, cycle enz, IVF and IV hep. Plan re look cor angio tomorrow. Discussed with pt and husband.  Lorretta Harp, M.D., [place], [facility], [place] [address] [place], [place]  [address]  [phone] 12/31/2014 12:31 PM

## 2014-12-31 NOTE — ED Notes (Signed)
Pt to ED via [facility] from home with complaint of right shoulder pain and right back pain, woke pt up at 7 am this morning. Denies SOB, n/v, or diaphoresis. Pt had stent placement last week and reports similar symptoms this visit. Pt received 324 mg aspirin in route as well as 2 nitroglycerin, pain is relieved upon arrival. Pt is a/o x4. Pt reports CABG (quadruple bypass) in 2000.

## 2014-12-31 NOTE — Progress Notes (Addendum)
ANTICOAGULATION CONSULT NOTE - Initial Consult  Pharmacy Consult for heparin Indication: atrial fibrillation  Allergies  Allergen Reactions  . Bisoprolol     unknown  . Codeine Nausea And Vomiting  . Demerol [Meperidine] Nausea And Vomiting  . Imipramine     Sweating, facial dysfunction   . Stadol [Butorphanol]     unknown  . Statins     MYALGIAS  . Tequin [Gatifloxacin]     Low blood sugar  . Septra [Sulfamethoxazole-Trimethoprim] Rash    Patient Measurements: Wt= 98.5kg Ht: 4' 11'' IBW= 45.5kg Heparin dosing wt: 69kg  Vital Signs: Temp: 97.7 F (36.5 C) (10/10 1307) Temp Source: Oral (10/10 1307) BP: 139/51 mmHg (10/10 1307) Pulse Rate: 67 (10/10 1307)  Labs:  Recent Labs  12/31/14 0900  HGB 10.5*  HCT 31.7*  PLT 261  CREATININE 1.34*    Estimated Creatinine Clearance: 36.6 mL/min (by C-G formula based on Cr of 1.34).   Medical History: Past Medical History  Diagnosis Date  . Coronary artery disease     a. s/p CABG 2000 (SVG/ free LIMA Y graft to the diagonal and distal LAD, SVG to the OM 1, and SVG to the PDA). Cath 12/19/2014 90% dSVG to RCA s/p 2 overlapping DES (3.5 x 28 and 3.5 x 8 mm Synergy)  . Hyperlipidemia   . OSA on CPAP   . Depression     with anxious component  . Obesity   . Stable angina (HCC)     microvascular, improved with Ranexa  . PMR (polymyalgia rheumatica) (HCC)     Medications:  Prescriptions prior to admission  Medication Sig Dispense Refill Last Dose  . amLODipine (NORVASC) 2.5 MG tablet Take 1 tablet (2.5 mg total) by mouth daily. 30 tablet 9 12/30/2014 at Unknown time  . aspirin 81 MG chewable tablet Chew 1 tablet (81 mg total) by mouth daily.   12/30/2014 at Unknown time  . atorvastatin (LIPITOR) 10 MG tablet Take 10 mg by mouth daily.   12/30/2014 at Unknown time  . Cholecalciferol (VITAMIN D PO) Take 1 tablet by mouth daily.   12/30/2014 at Unknown time  . Coenzyme Q10 10 MG capsule Take 10 mg by mouth daily.    12/30/2014 at Unknown time  . econazole nitrate 1 % cream Apply 1 application topically 2 (two) times daily as needed (rash in groin).   12/30/2014 at Unknown time  . fluocinonide cream (LIDEX) 1.19 % Apply 1 application topically 2 (two) times daily as needed (rash).    Past Month at Unknown time  . fluticasone (FLONASE) 50 MCG/ACT nasal spray Place 2 sprays into both nostrils daily as needed for allergies.    Past Month at Unknown time  . isosorbide mononitrate (IMDUR) 60 MG 24 hr tablet Take 2 tablets by mouth  once a day 60 tablet 0 12/30/2014 at Unknown time  . levocetirizine (XYZAL) 5 MG tablet Take 5 mg by mouth every evening.   12/30/2014 at Unknown time  . levothyroxine (SYNTHROID, LEVOTHROID) 75 MCG tablet Take 75 mcg by mouth every evening.    12/30/2014 at Unknown time  . metoprolol succinate (TOPROL-XL) 100 MG 24 hr tablet Take 1 tablet by mouth once a day 90 tablet 0 12/30/2014 at 2300  . nitroGLYCERIN (NITROSTAT) 0.4 MG SL tablet Place 1 tablet (0.4 mg total) under the tongue every 5 (five) minutes as needed for chest pain. 25 tablet 3 12/31/2014 at 0700  . Omega-3 Fatty Acids (FISH OIL PO) Take 1 tablet  by mouth 2 (two) times daily.    12/30/2014 at Unknown time  . predniSONE (DELTASONE) 1 MG tablet Take 1 mg by mouth daily with breakfast.   12/30/2014 at Unknown time  . Psyllium 400 MG CAPS Take 400 mg by mouth daily.   12/30/2014 at Unknown time  . RANEXA 1000 MG SR tablet Take 1 tablet by mouth  twice a day 180 tablet 1 12/30/2014 at Unknown time  . ticagrelor (BRILINTA) 90 MG TABS tablet Take 1 tablet (90 mg total) by mouth 2 (two) times daily. 180 tablet 3 12/30/2014 at Unknown time  . venlafaxine XR (EFFEXOR-XR) 75 MG 24 hr capsule Take 225 mg by mouth daily with breakfast. Patient takes 3 capsules      Scheduled:  . amLODipine  2.5 mg Oral Daily  . aspirin  81 mg Oral Daily  . atorvastatin  10 mg Oral Daily  . cholecalciferol  1,000 Units Oral Daily  . levocetirizine  5 mg Oral QPM   . levothyroxine  75 mcg Oral QPM  . metoprolol succinate  100 mg Oral Daily  . nitroGLYCERIN  0.5 inch Topical 4 times per day  . omega-3 acid ethyl esters  1 g Oral BID  . polycarbophil  625 mg Oral Daily  . [START ON 01/01/2015] predniSONE  1 mg Oral Q breakfast  . ranolazine  1,000 mg Oral BID  . sodium chloride  3 mL Intravenous Q12H  . ticagrelor  90 mg Oral BID  . venlafaxine  75 mg Oral TID WC    Assessment: 74 yo female here with CP and noted w/recent PCI with DES (11/2014) and hx of CABG. Pharmacy has been consulted to dose heparin for ACS with plans for cath on 10/11.  Goal of Therapy:  Heparin level 0.3-0.7 units/ml Monitor platelets by anticoagulation protocol: Yes   Plan:  -Heparin bolus 4000 units IV followed by  850 units/hr (~ 12 units/kg/hr) -Heparin level in 6 hours and daily wth CBC daily  Hildred Laser, Pharm D 12/31/2014 2:00 PM    ADDN: Initial HL is therapeutic at 0.36 on heparin 850 units/hr. No issues with infusion or bleeding noted. Will continue heparin 850 units/hr and recheck HL with AM labs.  Andrey Cota. Diona Foley, PharmD Clinical Pharmacist Pager [phone]

## 2014-12-31 NOTE — ED Provider Notes (Signed)
CSN: [id number]     Arrival date & time 12/31/14  0840 History   First MD Initiated Contact with Patient 12/31/14 0840     Chief Complaint  Patient presents with  . Back Pain    RECENT STENT PLACEMENT  . Shoulder Pain    Patient is a 74 y.o. female presenting with shoulder pain and chest pain.  Shoulder Pain Associated symptoms: no fatigue and no fever   Chest Pain Chest pain location: Right shoulder pain. Pain quality: aching   Pain radiates to:  R shoulder Pain severity:  Moderate Onset quality:  Sudden Timing:  Constant Progression:  Partially resolved Chronicity:  New Context: at rest   Relieved by:  Nitroglycerin Associated symptoms: no abdominal pain, no anorexia, no anxiety, no claudication, no cough, no diaphoresis, no dizziness, no dysphagia, no fatigue, no fever, no heartburn, no lower extremity edema, no nausea, no near-syncope, no numbness, no orthopnea, no palpitations, no shortness of breath, no syncope, not vomiting and no weakness   Risk factors: coronary artery disease       Past Medical History  Diagnosis Date  . Coronary artery disease     a. s/p CABG 2000 (SVG/ free LIMA Y graft to the diagonal and distal LAD, SVG to the OM 1, and SVG to the PDA). Cath 12/19/2014 90% distal SVG to RCA lesion treated with 2 overlapping DES (3.5 x 28 and 3.5 x 8 mm Synergy)  . Hyperlipidemia   . OSA on CPAP   . Depression     with anxious component  . Obesity   . Stable angina (HCC)     microvascular, improved with Ranexa  . PMR (polymyalgia rheumatica) [facility])    Past Surgical History  Procedure Laterality Date  . Cataract extraction Bilateral 2011  . Breast biopsy  1964  . Repair of left patellar  1993  . Cardiac catheterization  08    patent grafts, no culprit lesions, EF 65%  . Coronary artery bypass graft  2000    ASCVD, multivessel, S./P.  Marland Kitchen Cardiac catheterization N/A 12/19/2014    Procedure: Left Heart Cath and Cors/Grafts Angiography;  Surgeon: Jettie Booze, MD;  Location: [place] [facility];  Service: Cardiovascular;  Laterality: N/A;  . Cardiac catheterization N/A 12/19/2014    Procedure: Coronary Stent Intervention;  Surgeon: Jettie Booze, MD;  Location: [place] [facility];  Service: Cardiovascular;  Laterality: N/A;   Family History  Problem Relation Age of Onset  . Heart disease Mother   . Heart disease Father   . Diabetes Brother    Social History  Substance Use Topics  . Smoking status: Former Research scientist (life sciences)  . Smokeless tobacco: None  . Alcohol Use: Yes   OB History    No data available     Review of Systems  Constitutional: Negative for fever, diaphoresis and fatigue.  HENT: Negative for trouble swallowing.   Respiratory: Negative for cough and shortness of breath.   Cardiovascular: Positive for chest pain. Negative for palpitations, orthopnea, claudication, syncope and near-syncope.  Gastrointestinal: Negative for heartburn, nausea, vomiting, abdominal pain and anorexia.  Neurological: Negative for dizziness, weakness and numbness.  All other systems reviewed and are negative.     Allergies  Bisoprolol; Codeine; Demerol; Imipramine; Septra; Stadol; Statins; and Tequin  Home Medications   Prior to Admission medications   Medication Sig Start Date End Date Taking? Authorizing Provider  amLODipine (NORVASC) 2.5 MG tablet Take 1 tablet (2.5 mg total) by mouth daily.  07/11/14   Jettie Booze, MD  aspirin 81 MG chewable tablet Chew 1 tablet (81 mg total) by mouth daily. 12/20/14   Almyra Deforest, PA  atorvastatin (LIPITOR) 10 MG tablet Take 10 mg by mouth daily.    Historical Provider, MD  Cholecalciferol (VITAMIN D PO) Take 1 tablet by mouth daily.    Historical Provider, MD  Coenzyme Q10 10 MG capsule Take 10 mg by mouth daily.    Historical Provider, MD  fluocinonide cream (LIDEX) 3.81 % Apply 1 application topically 2 (two) times daily.    Historical Provider, MD  fluticasone (FLONASE) 50 MCG/ACT nasal spray  Place 2 sprays into both nostrils daily.    Historical Provider, MD  isosorbide mononitrate (IMDUR) 60 MG 24 hr tablet Take 2 tablets by mouth  once a day 06/15/14   Jettie Booze, MD  levocetirizine (XYZAL) 5 MG tablet Take 5 mg by mouth every evening.    Historical Provider, MD  levothyroxine (SYNTHROID, LEVOTHROID) 75 MCG tablet Take 75 mcg by mouth daily before breakfast.    Historical Provider, MD  metoprolol succinate (TOPROL-XL) 100 MG 24 hr tablet Take 1 tablet by mouth once a day 12/03/14   Jettie Booze, MD  nitroGLYCERIN (NITROSTAT) 0.4 MG SL tablet Place 1 tablet (0.4 mg total) under the tongue every 5 (five) minutes as needed for chest pain. 12/20/14   Almyra Deforest, PA  Omega-3 Fatty Acids (FISH OIL PO) Take 1 tablet by mouth daily.    Historical Provider, MD  predniSONE (DELTASONE) 1 MG tablet Take 1 mg by mouth daily with breakfast.    Historical Provider, MD  Psyllium 400 MG CAPS Take 400 mg by mouth daily.    Historical Provider, MD  RANEXA 1000 MG SR tablet Take 1 tablet by mouth  twice a day 07/09/14   Jettie Booze, MD  ticagrelor (BRILINTA) 90 MG TABS tablet Take 1 tablet (90 mg total) by mouth 2 (two) times daily. 12/20/14   Almyra Deforest, PA  venlafaxine (EFFEXOR) 75 MG tablet Take 225 mg by mouth daily.     Historical Provider, MD   BP 142/50 mmHg  Pulse 70  Temp(Src) 98.8 F (37.1 C) (Oral)  Resp 17  SpO2 98% Physical Exam  Constitutional: She is oriented to person, place, and time. She appears well-developed and well-nourished. No distress.  Obese female  HENT:  Head: Normocephalic.  Eyes: Pupils are equal, round, and reactive to light.  Neck: Normal range of motion.  Cardiovascular: Normal rate.   No murmur heard. Pulmonary/Chest: Effort normal and breath sounds normal. No respiratory distress. She has no wheezes. She has no rales.  Abdominal: Soft. She exhibits no distension. There is no tenderness.  Musculoskeletal: Normal range of motion. She exhibits  no edema or tenderness.  Neurological: She is alert and oriented to person, place, and time. No cranial nerve deficit. Coordination normal.  Skin: Skin is warm and dry. No rash noted. She is not diaphoretic. No erythema.  Psychiatric: She has a normal mood and affect. Her behavior is normal. Judgment and thought content normal.  Nursing note and vitals reviewed.   ED Course  Procedures (including critical care time) Labs Review Labs Reviewed  BASIC METABOLIC PANEL - Abnormal; Notable for the following:    Glucose, Bld 106 (*)    BUN 23 (*)    Creatinine, Ser 1.34 (*)    GFR calc non Af Amer 38 (*)    GFR calc Af Amer 44 (*)  All other components within normal limits  CBC - Abnormal; Notable for the following:    WBC 11.7 (*)    RBC 3.38 (*)    Hemoglobin 10.5 (*)    HCT 31.7 (*)    All other components within normal limits  TSH  TSH  TROPONIN I  PROTIME-INR  TROPONIN I  TROPONIN I  HEPARIN LEVEL (UNFRACTIONATED)  COMPREHENSIVE METABOLIC PANEL  LIPID PANEL  HEPARIN LEVEL (UNFRACTIONATED)  CBC  GLUCOSE, CAPILLARY  I-STAT TROPOININ, ED    Imaging Review Dg Chest 2 View  12/31/2014   CLINICAL DATA:  Right shoulder and back pain, onset this morning, no shortness of breath, nausea vomiting, or diaphoresis. Stent placement last week with similar symptoms, history of CABG.  EXAM: CHEST  2 VIEW  COMPARISON:  PA and lateral chest x-ray of December 19, 2014  FINDINGS: The lungs are adequately inflated. The interstitial markings are coarse but stable. The cardiac silhouette remains enlarged. There are post CABG changes. The pulmonary vascularity is not engorged. There is dense calcification in the aortic arch. The mediastinum is normal in width. There is no pleural effusion or pneumothorax. There is mild multilevel degenerative disc disease of the thoracic spine.  IMPRESSION: Stable cardiomegaly with post CABG changes. There is no CHF, pneumonia, or other acute cardiopulmonary  abnormality.   Electronically Signed   By: David  Martinique M.D.   On: 12/31/2014 10:09   I have personally reviewed and evaluated these images and lab results as part of my medical decision-making.   EKG Interpretation   Date/Time:  Monday December 31 2014 08:45:02 EDT Ventricular Rate:  72 PR Interval:    QRS Duration: 82 QT Interval:  442 QTC Calculation: 484 R Axis:   17 Text Interpretation:  normal sinus rhythm Low voltage, precordial leads  Abnormal R-wave progression, early transition Nonspecific T abnormalities,  lateral leads No significant change was found Confirmed by CAMPOS  MD,  Lennette Bihari ([id number]) on 12/31/2014 10:02:46 AM      MDM   Patient presents emergency department today with right shoulder pain is consistent with her previous cardiac events that required a CABG in 2000 as well as drug-eluting stent placed last week. Patient denies any shortness of breath, diaphoresis, nausea, lightheadedness. She states that she's not been febrile, coughing. She is currently on Brilinta and has no history of DVTs or PEs. Patient is well-appearing on exam. Given her recent history and similar placement cardiology will be consulted.  Cardiology to admit  Final diagnoses:  Unstable angina [facility])  Coronary artery disease involving coronary bypass graft of native heart with unstable angina pectoris [facility])  Essential hypertension      Roberto Scales, MD 12/31/14 [place], MD 01/04/15 [phone]

## 2015-01-01 ENCOUNTER — Inpatient Hospital Stay (HOSPITAL_COMMUNITY): Payer: Medicare Other

## 2015-01-01 ENCOUNTER — Encounter (HOSPITAL_COMMUNITY)
Admission: EM | Disposition: A | Discharge status: Home / Self Care | Payer: Self-pay | Source: Home / Self Care | Attending: Cardiovascular Disease

## 2015-01-01 ENCOUNTER — Encounter (HOSPITAL_COMMUNITY): Payer: Self-pay | Admitting: Interventional Cardiology

## 2015-01-01 DIAGNOSIS — R079 Chest pain, unspecified: Secondary | ICD-10-CM

## 2015-01-01 DIAGNOSIS — I25700 Atherosclerosis of coronary artery bypass graft(s), unspecified, with unstable angina pectoris: Secondary | ICD-10-CM | POA: Insufficient documentation

## 2015-01-01 HISTORY — PX: CARDIAC CATHETERIZATION: SHX172

## 2015-01-01 LAB — COMPREHENSIVE METABOLIC PANEL
ALT: 13 U/L — ABNORMAL LOW (ref 14–54)
AST: 18 U/L (ref 15–41)
Albumin: 3.3 g/dL — ABNORMAL LOW (ref 3.5–5.0)
Alkaline Phosphatase: 60 U/L (ref 38–126)
Anion gap: 10 (ref 5–15)
BUN: 29 mg/dL — ABNORMAL HIGH (ref 6–20)
CO2: 22 mmol/L (ref 22–32)
Calcium: 8.9 mg/dL (ref 8.9–10.3)
Chloride: 103 mmol/L (ref 101–111)
Creatinine, Ser: 1.11 mg/dL — ABNORMAL HIGH (ref 0.44–1.00)
GFR calc Af Amer: 55 mL/min — ABNORMAL LOW (ref >60)
GFR calc non Af Amer: 47 mL/min — ABNORMAL LOW (ref >60)
Glucose, Bld: 106 mg/dL — ABNORMAL HIGH (ref 65–99)
Potassium: 3.8 mmol/L (ref 3.5–5.1)
Sodium: 135 mmol/L (ref 135–145)
Total Bilirubin: 0.4 mg/dL (ref 0.3–1.2)
Total Protein: 6.4 g/dL — ABNORMAL LOW (ref 6.5–8.1)

## 2015-01-01 LAB — HEPARIN LEVEL (UNFRACTIONATED)
Heparin Unfractionated: 0.22 IU/mL — ABNORMAL LOW (ref 0.30–0.70)
Heparin Unfractionated: 0.29 IU/mL — ABNORMAL LOW (ref 0.30–0.70)

## 2015-01-01 LAB — CBC
HCT: 32 % — ABNORMAL LOW (ref 36.0–46.0)
Hemoglobin: 10.5 g/dL — ABNORMAL LOW (ref 12.0–15.0)
MCH: 30.6 pg (ref 26.0–34.0)
MCHC: 32.8 g/dL (ref 30.0–36.0)
MCV: 93.3 fL (ref 78.0–100.0)
Platelets: 260 10*3/uL (ref 150–400)
RBC: 3.43 MIL/uL — ABNORMAL LOW (ref 3.87–5.11)
RDW: 14.1 % (ref 11.5–15.5)
WBC: 11.8 10*3/uL — ABNORMAL HIGH (ref 4.0–10.5)

## 2015-01-01 LAB — LIPID PANEL
Cholesterol: 168 mg/dL (ref 0–200)
HDL: 54 mg/dL (ref >40)
LDL Cholesterol: 48 mg/dL (ref 0–99)
Total CHOL/HDL Ratio: 3.1 RATIO
Triglycerides: 332 mg/dL — ABNORMAL HIGH (ref <150)
VLDL: 66 mg/dL — ABNORMAL HIGH (ref 0–40)

## 2015-01-01 LAB — TROPONIN I: Troponin I: <0.03 ng/mL (ref <0.031)

## 2015-01-01 SURGERY — LEFT HEART CATH AND CORS/GRAFTS ANGIOGRAPHY

## 2015-01-01 MED ORDER — SODIUM CHLORIDE 0.9 % IV SOLN: 250.0000 mL | INTRAVENOUS | Status: DC | PRN

## 2015-01-01 MED ORDER — VENLAFAXINE HCL ER 75 MG PO CP24
225.0000 mg | ORAL_CAPSULE | Freq: Every day | ORAL | Status: DC
  Filled 2015-01-01: qty 1

## 2015-01-01 MED ORDER — SODIUM CHLORIDE 0.9 % IV SOLN
INTRAVENOUS | Status: DC | PRN
  Administered 2015-01-01: 50 mL/h via INTRAVENOUS

## 2015-01-01 MED ORDER — SODIUM CHLORIDE 0.9 % WEIGHT BASED INFUSION: 1.0000 mL/kg/h | INTRAVENOUS | Status: AC

## 2015-01-01 MED ORDER — ACETAMINOPHEN 325 MG PO TABS: 650.0000 mg | ORAL_TABLET | ORAL | Status: DC | PRN

## 2015-01-01 MED ORDER — LIDOCAINE HCL (PF) 1 % IJ SOLN
INTRAMUSCULAR | Status: DC | PRN
  Administered 2015-01-01: 13:00:00

## 2015-01-01 MED ORDER — HEPARIN (PORCINE) IN NACL 2-0.9 UNIT/ML-% IJ SOLN
INTRAMUSCULAR | Status: AC
  Filled 2015-01-01: qty 1000

## 2015-01-01 MED ORDER — FENTANYL CITRATE (PF) 100 MCG/2ML IJ SOLN
INTRAMUSCULAR | Status: AC
  Filled 2015-01-01: qty 4

## 2015-01-01 MED ORDER — LIDOCAINE HCL (PF) 1 % IJ SOLN
INTRAMUSCULAR | Status: AC
  Filled 2015-01-01: qty 30

## 2015-01-01 MED ORDER — IOHEXOL 350 MG/ML SOLN
INTRAVENOUS | Status: DC | PRN
  Administered 2015-01-01: 30 mL via INTRA_ARTERIAL

## 2015-01-01 MED ORDER — ONDANSETRON HCL 4 MG/2ML IJ SOLN: 4.0000 mg | Freq: Four times a day (QID) | INTRAMUSCULAR | Status: DC | PRN

## 2015-01-01 MED ORDER — MIDAZOLAM HCL 2 MG/2ML IJ SOLN
INTRAMUSCULAR | Status: AC
  Filled 2015-01-01: qty 4

## 2015-01-01 MED ORDER — MIDAZOLAM HCL 2 MG/2ML IJ SOLN
INTRAMUSCULAR | Status: DC | PRN
  Administered 2015-01-01: 1 mg via INTRAVENOUS

## 2015-01-01 MED ORDER — SODIUM CHLORIDE 0.9 % IJ SOLN: 3.0000 mL | INTRAMUSCULAR | Status: DC | PRN

## 2015-01-01 MED ORDER — SODIUM CHLORIDE 0.9 % IJ SOLN
3.0000 mL | Freq: Two times a day (BID) | INTRAMUSCULAR | Status: DC
  Administered 2015-01-01: 3 mL via INTRAVENOUS

## 2015-01-01 MED ORDER — FENTANYL CITRATE (PF) 100 MCG/2ML IJ SOLN
INTRAMUSCULAR | Status: DC | PRN
  Administered 2015-01-01: 25 ug via INTRAVENOUS

## 2015-01-01 SURGICAL SUPPLY — 7 items
CATH INFINITI 5FR JL4 (CATHETERS) ×3 IMPLANT
CATH INFINITI JR4 5F (CATHETERS) ×3 IMPLANT
KIT HEART LEFT (KITS) ×3 IMPLANT
PACK CARDIAC CATHETERIZATION (CUSTOM PROCEDURE TRAY) ×3 IMPLANT
SHEATH PINNACLE 5F 10CM (SHEATH) ×3 IMPLANT
TRANSDUCER W/STOPCOCK (MISCELLANEOUS) ×3 IMPLANT
WIRE EMERALD 3MM-J .035X150CM (WIRE) ×3 IMPLANT

## 2015-01-01 NOTE — Progress Notes (Signed)
*  PRELIMINARY RESULTS* Echocardiogram 2D Echocardiogram has been performed.  Leavy Cella 01/01/2015, 9:54 AM

## 2015-01-01 NOTE — Interval H&P Note (Signed)
Cath Lab Visit (complete for each Cath Lab visit)  Clinical Evaluation Leading to the Procedure:   ACS: Yes.    Non-ACS:    Anginal Classification: CCS IV  Anti-ischemic medical therapy: Maximal Therapy (2 or more classes of medications)  Non-Invasive Test Results: No non-invasive testing performed  Prior CABG: Previous CABG      History and Physical Interval Note:  01/01/2015 12:22 PM  [name]  has presented today for surgery, with the diagnosis of cp  The various methods of treatment have been discussed with the patient and family. After consideration of risks, benefits and other options for treatment, the patient has consented to  Procedure(s): Left Heart Cath and Cors/Grafts Angiography (N/A) as a surgical intervention .  The patient's history has been reviewed, patient examined, no change in status, stable for surgery.  I have reviewed the patient's chart and labs.  Questions were answered to the patient's satisfaction.     Azilee Pirro S.

## 2015-01-01 NOTE — Discharge Summary (Signed)
Physician Discharge Summary    Cardiologist:  Irish Lack Patient ID: Erin Ingram MRN: [id number] DOB/AGE: [birth year] 74 y.o.  Admit date: 12/31/2014 Discharge date: 01/01/2015  Admission Diagnoses: Stable angina  Discharge Diagnoses:  Active Problems:   Unstable angina (HCC)   Coronary artery disease involving coronary bypass graft of native heart with unstable angina pectoris ([place])   mixed hyperlipidemia Morbid obesity Essential hypertension   Discharged Condition: stable  Hospital Course:  Erin Ingram is a 74 y.o. female with a history of CABG in 2000. This included a SVG/ free LIMA Y graft to the diagonal and distal LAD, SVG to the OM 1, and SVG to the PDA. Cath in 2008 showed patent grafts. She has chronic angina over the years and was treated with EECP many years ago. She has been on chronic nitrates, amlodipine, metoprolol, and Ranexa. Historically she has angina about 2-4 times a month where she has to take sl Ntg.   Seen by Dr Martinique 09/28 when she came to the ED with chest pain. Cath with 90% SVG-RCA, s/p DES x 2. D/c 09/29. Her previous angina had been in her L shoulder and arm, but this pain was in her R shoulder and arm. Cath results below.  She initially did well after d/c and had no chest pain. She was feeling well otherwise when she went to bed last pm. She was wakened at 7 am with pain in the back of her R arm, aching and 5/10. It was also in her R shoulder. It felt like the pain from 09/28. She moved around but the pain did not improve. She took SL NTG x 2 and the pain resolved. She had some milder pain since then, but she is also having some MS R shoulder discomfort. The R shoulder pain is not like her angina, but the arm pain is. No associated symptoms. Prior to the last stents, she had significant DOE, but has not noticed this today.  Patient was admitted for observation.  She ruled out for MI. Given her constellation of symptoms of similarity to prior. She  was taken for left heart catheterization which revealed severe native three-vessel coronary artery disease. The LIMA to the LAD was patent. SVG to diagonal was occluded. Patent SVG to OM and SVG to the RCA graft and recently placed stent were widely patent. Showed normal LVEDP.  Patient was seen by Dr. Gwenlyn Found and deemed stable for discharge home.    Consults: None  Significant Diagnostic Studies:  Left heart catheterization Conclusion     Severe native three vessel CAD.  Y graft. LIMA to LAD (patent); SVG to diagonal (occluded).  Patent SVG to OM.  SVG to RCA graft and recently placed stents are widely patent.  Normal LVEDP.   Coronary Diagrams    Diagnostic Diagram          Treatments: See above  Discharge Exam: Blood pressure 113/50, pulse 58, temperature 98.1 F (36.7 C), temperature source Oral, resp. rate 19, height 4\' 10"  (1.473 m), weight 211 lb 12.8 oz (96.072 kg), SpO2 98 %.   Disposition: 01-Home or Self Care      Discharge Instructions    Diet - low sodium heart healthy    Complete by:  As directed      Discharge instructions    Complete by:  As directed   No lifting more than a half gallon of milk or driving for three days.     Increase activity slowly  Complete by:  As directed             Medication List    TAKE these medications        amLODipine 2.5 MG tablet  Commonly known as:  NORVASC  Take 1 tablet (2.5 mg total) by mouth daily.     aspirin 81 MG chewable tablet  Chew 1 tablet (81 mg total) by mouth daily.     atorvastatin 10 MG tablet  Commonly known as:  LIPITOR  Take 10 mg by mouth daily.     Coenzyme Q10 10 MG capsule  Take 10 mg by mouth daily.     econazole nitrate 1 % cream  Apply 1 application topically 2 (two) times daily as needed (rash in groin).     FISH OIL PO  Take 1 tablet by mouth 2 (two) times daily.     fluocinonide cream 0.05 %  Commonly known as:  LIDEX  Apply 1 application topically 2 (two) times  daily as needed (rash).     fluticasone 50 MCG/ACT nasal spray  Commonly known as:  FLONASE  Place 2 sprays into both nostrils daily as needed for allergies.     isosorbide mononitrate 60 MG 24 hr tablet  Commonly known as:  IMDUR  Take 2 tablets by mouth  once a day     levocetirizine 5 MG tablet  Commonly known as:  XYZAL  Take 5 mg by mouth every evening.     levothyroxine 75 MCG tablet  Commonly known as:  SYNTHROID, LEVOTHROID  Take 75 mcg by mouth every evening.     metoprolol succinate 100 MG 24 hr tablet  Commonly known as:  TOPROL-XL  Take 1 tablet by mouth once a day     nitroGLYCERIN 0.4 MG SL tablet  Commonly known as:  NITROSTAT  Place 1 tablet (0.4 mg total) under the tongue every 5 (five) minutes as needed for chest pain.     predniSONE 1 MG tablet  Commonly known as:  DELTASONE  Take 1 mg by mouth daily with breakfast.     Psyllium 400 MG Caps  Take 400 mg by mouth daily.     RANEXA 1000 MG SR tablet  Generic drug:  ranolazine  Take 1 tablet by mouth  twice a day     ticagrelor 90 MG Tabs tablet  Commonly known as:  BRILINTA  Take 1 tablet (90 mg total) by mouth 2 (two) times daily.     venlafaxine XR 75 MG 24 hr capsule  Commonly known as:  EFFEXOR-XR  Take 225 mg by mouth daily with breakfast. Patient takes 3 capsules     VITAMIN D PO  Take 1 tablet by mouth daily.       Follow-up Information    Follow up with Richardson Dopp, PA-C On 01/10/2015.   Specialties:  Physician Assistant, Radiology, Interventional Cardiology   Why:  10: 10 AM   Contact information:   [address] [place] [address] [phone]      Greater than 30 minutes was spent completing the patient's discharge.   SignedTarri Fuller, PA-C 01/01/2015, 4:34 PM

## 2015-01-01 NOTE — Progress Notes (Signed)
UR Completed Buna Cuppett Graves-Bigelow, RN,BSN [phone]  

## 2015-01-01 NOTE — Progress Notes (Signed)
Discharge teaching and instructions reviewed with with and pt husband. VSS. Pt will be discharging home via husband after bedrest at 5:30.

## 2015-01-01 NOTE — Progress Notes (Signed)
ANTICOAGULATION CONSULT NOTE - Follow-up  Pharmacy Consult for heparin Indication: atrial fibrillation  Allergies  Allergen Reactions  . Bisoprolol     unknown  . Codeine Nausea And Vomiting  . Demerol [Meperidine] Nausea And Vomiting  . Imipramine     Sweating, facial dysfunction   . Stadol [Butorphanol]     unknown  . Statins     MYALGIAS  . Tequin [Gatifloxacin]     Low blood sugar  . Septra [Sulfamethoxazole-Trimethoprim] Rash    Patient Measurements: Wt= 98.5kg Ht: 4' 11'' IBW= 45.5kg Heparin dosing wt: 69kg  Vital Signs: Temp: 98.8 F (37.1 C) (10/10 2018) Temp Source: Oral (10/10 2018) BP: 122/48 mmHg (10/10 2018) Pulse Rate: 64 (10/10 2018)  Labs:  Recent Labs  12/31/14 0900 12/31/14 1424 12/31/14 1830 12/31/14 2120 01/01/15 0113  HGB 10.5*  --   --   --  10.5*  HCT 31.7*  --   --   --  32.0*  PLT 261  --   --   --  260  LABPROT  --  14.3  --   --   --   INR  --  1.09  --   --   --   HEPARINUNFRC  --   --   --  0.36 0.22*  CREATININE 1.34*  --   --   --   --   TROPONINI  --  <0.03 <0.03  --   --     Estimated Creatinine Clearance: 36.1 mL/min (by C-G formula based on Cr of 1.34).  Assessment: 74 yo female here with CP and noted w/recent PCI with DES (11/2014) and hx of CABG. She continues on IV heparin and level is now subtherapeutic. No problems or bleeding noted.   Goal of Therapy:  Heparin level 0.3-0.7 units/ml Monitor platelets by anticoagulation protocol: Yes   Plan:  - Increase heparin gtt to 1000 units/hr - Check an 8 hour heparin level  Salome Arnt, PharmD, BCPS Pager # [phone] 01/01/2015 1:52 AM

## 2015-01-01 NOTE — H&P (View-Only) (Signed)
Subjective:  No Further RUE discomfort (anginal equiv)  Objective:  Temp:  [97.7 F (36.5 C)-98.8 F (37.1 C)] 98.1 F (36.7 C) (10/11 0507) Pulse Rate:  [58-75] 75 (10/11 0507) Resp:  [15-20] 18 (10/11 0507) BP: (113-140)/(46-56) 140/46 mmHg (10/11 0507) SpO2:  [95 %-99 %] 98 % (10/11 0507) Weight:  [211 lb 12.8 oz (96.072 kg)-212 lb (96.163 kg)] 211 lb 12.8 oz (96.072 kg) (10/11 0507) Weight change:   Intake/Output from previous day: 10/10 0701 - 10/11 0700 In: 993.3 [P.O.:240; I.V.:753.3] Out: -   Intake/Output from this shift:    Physical Exam: General appearance: alert and no distress Neck: no adenopathy, no carotid bruit, no JVD, supple, symmetrical, trachea midline and thyroid not enlarged, symmetric, no tenderness/mass/nodules Lungs: clear to auscultation bilaterally Heart: regular rate and rhythm, S1, S2 normal, no murmur, click, rub or gallop Extremities: extremities normal, atraumatic, no cyanosis or edema  Lab Results: Results for orders placed or performed during the hospital encounter of 12/31/14 (from the past 48 hour(s))  Basic metabolic panel     Status: Abnormal   Collection Time: 12/31/14  9:00 AM  Result Value Ref Range   Sodium 138 135 - 145 mmol/L   Potassium 4.0 3.5 - 5.1 mmol/L   Chloride 107 101 - 111 mmol/L   CO2 22 22 - 32 mmol/L   Glucose, Bld 106 (H) 65 - 99 mg/dL   BUN 23 (H) 6 - 20 mg/dL   Creatinine, Ser 1.34 (H) 0.44 - 1.00 mg/dL   Calcium 8.9 8.9 - 10.3 mg/dL   GFR calc non Af Amer 38 (L) >60 mL/min   GFR calc Af Amer 44 (L) >60 mL/min    Comment: (NOTE) The eGFR has been calculated using the CKD EPI equation. This calculation has not been validated in all clinical situations. eGFR's persistently <60 mL/min signify possible Chronic Kidney Disease.    Anion gap 9 5 - 15  CBC     Status: Abnormal   Collection Time: 12/31/14  9:00 AM  Result Value Ref Range   WBC 11.7 (H) 4.0 - 10.5 K/uL   RBC 3.38 (L) 3.87 - 5.11 MIL/uL     Hemoglobin 10.5 (L) 12.0 - 15.0 g/dL   HCT 31.7 (L) 36.0 - 46.0 %   MCV 93.8 78.0 - 100.0 fL   MCH 31.1 26.0 - 34.0 pg   MCHC 33.1 30.0 - 36.0 g/dL   RDW 14.0 11.5 - 15.5 %   Platelets 261 150 - 400 K/uL  I-stat troponin, ED     Status: None   Collection Time: 12/31/14  9:09 AM  Result Value Ref Range   Troponin i, poc 0.00 0.00 - 0.08 ng/mL   Comment 3            Comment: Due to the release kinetics of cTnI, a negative result within the first hours of the onset of symptoms does not rule out myocardial infarction with certainty. If myocardial infarction is still suspected, repeat the test at appropriate intervals.   TSH     Status: None   Collection Time: 12/31/14 12:15 PM  Result Value Ref Range   TSH 3.247 0.350 - 4.500 uIU/mL  TSH     Status: None   Collection Time: 12/31/14  2:24 PM  Result Value Ref Range   TSH 2.857 0.350 - 4.500 uIU/mL  Troponin I     Status: None   Collection Time: 12/31/14  2:24 PM  Result Value Ref  Range   Troponin I <0.03 <0.031 ng/mL    Comment:        NO INDICATION OF MYOCARDIAL INJURY.   Protime-INR     Status: None   Collection Time: 12/31/14  2:24 PM  Result Value Ref Range   Prothrombin Time 14.3 11.6 - 15.2 seconds   INR 1.09 0.00 - 1.49  Glucose, capillary     Status: Abnormal   Collection Time: 12/31/14  4:35 PM  Result Value Ref Range   Glucose-Capillary 128 (H) 65 - 99 mg/dL   Comment 1 Notify RN   Troponin I     Status: None   Collection Time: 12/31/14  6:30 PM  Result Value Ref Range   Troponin I <0.03 <0.031 ng/mL    Comment:        NO INDICATION OF MYOCARDIAL INJURY.   Heparin level (unfractionated)     Status: None   Collection Time: 12/31/14  9:20 PM  Result Value Ref Range   Heparin Unfractionated 0.36 0.30 - 0.70 IU/mL    Comment:        IF HEPARIN RESULTS ARE BELOW EXPECTED VALUES, AND PATIENT DOSAGE HAS BEEN CONFIRMED, SUGGEST FOLLOW UP TESTING OF ANTITHROMBIN III LEVELS.   Troponin I     Status: None    Collection Time: 01/01/15  1:13 AM  Result Value Ref Range   Troponin I <0.03 <0.031 ng/mL    Comment:        NO INDICATION OF MYOCARDIAL INJURY.   Comprehensive metabolic panel     Status: Abnormal   Collection Time: 01/01/15  1:13 AM  Result Value Ref Range   Sodium 135 135 - 145 mmol/L   Potassium 3.8 3.5 - 5.1 mmol/L   Chloride 103 101 - 111 mmol/L   CO2 22 22 - 32 mmol/L   Glucose, Bld 106 (H) 65 - 99 mg/dL   BUN 29 (H) 6 - 20 mg/dL   Creatinine, Ser 1.11 (H) 0.44 - 1.00 mg/dL   Calcium 8.9 8.9 - 10.3 mg/dL   Total Protein 6.4 (L) 6.5 - 8.1 g/dL   Albumin 3.3 (L) 3.5 - 5.0 g/dL   AST 18 15 - 41 U/L   ALT 13 (L) 14 - 54 U/L   Alkaline Phosphatase 60 38 - 126 U/L   Total Bilirubin 0.4 0.3 - 1.2 mg/dL   GFR calc non Af Amer 47 (L) >60 mL/min   GFR calc Af Amer 55 (L) >60 mL/min    Comment: (NOTE) The eGFR has been calculated using the CKD EPI equation. This calculation has not been validated in all clinical situations. eGFR's persistently <60 mL/min signify possible Chronic Kidney Disease.    Anion gap 10 5 - 15  Heparin level (unfractionated)     Status: Abnormal   Collection Time: 01/01/15  1:13 AM  Result Value Ref Range   Heparin Unfractionated 0.22 (L) 0.30 - 0.70 IU/mL    Comment:        IF HEPARIN RESULTS ARE BELOW EXPECTED VALUES, AND PATIENT DOSAGE HAS BEEN CONFIRMED, SUGGEST FOLLOW UP TESTING OF ANTITHROMBIN III LEVELS.   CBC     Status: Abnormal   Collection Time: 01/01/15  1:13 AM  Result Value Ref Range   WBC 11.8 (H) 4.0 - 10.5 K/uL   RBC 3.43 (L) 3.87 - 5.11 MIL/uL   Hemoglobin 10.5 (L) 12.0 - 15.0 g/dL   HCT 32.0 (L) 36.0 - 46.0 %   MCV 93.3 78.0 - 100.0 fL     MCH 30.6 26.0 - 34.0 pg   MCHC 32.8 30.0 - 36.0 g/dL   RDW 14.1 11.5 - 15.5 %   Platelets 260 150 - 400 K/uL  Lipid panel     Status: Abnormal   Collection Time: 01/01/15  1:13 AM  Result Value Ref Range   Cholesterol 168 0 - 200 mg/dL   Triglycerides 332 (H) <150 mg/dL   HDL 54  >40 mg/dL   Total CHOL/HDL Ratio 3.1 RATIO   VLDL 66 (H) 0 - 40 mg/dL   LDL Cholesterol 48 0 - 99 mg/dL    Comment:        Total Cholesterol/HDL:CHD Risk Coronary Heart Disease Risk Table                     Men   Women  1/2 Average Risk   3.4   3.3  Average Risk       5.0   4.4  2 X Average Risk   9.6   7.1  3 X Average Risk  23.4   11.0        Use the calculated Patient Ratio above and the CHD Risk Table to determine the patient's CHD Risk.        ATP III CLASSIFICATION (LDL):  <100     mg/dL   Optimal  100-129  mg/dL   Near or Above                    Optimal  130-159  mg/dL   Borderline  160-189  mg/dL   High  >190     mg/dL   Very High     Imaging: Imaging results have been reviewed  Assessment/Plan:   1. Active Problems: 2.   Unstable angina (HCC) 3.   Time Spent Directly with Patient:  15 minutes  Length of Stay:  LOS: 1 day   Enz neg. S/P RCA SVG DES 2 weeks ago admitted yesterday with similar Sx. On IV hep. Exam benign. For relook cath today. If neg can D/C home later this PM.  Erin Ingram 01/01/2015, 10:28 AM 

## 2015-01-01 NOTE — Clinical Social Work Note (Signed)
CSW met with patient at bedside as patient is from [facility]. Patient states she is from the independent living where she lives with her husband. She states that she plans to return to the independent living at discharge. Per facility they can arrange any [facility] services needed. Tiffany, independent living nurse at facility says they can be called at [phone] or after 5PM at [phone]. CSW signing off at this time.   Liz Beach MSW, [place], [place], [phone]

## 2015-01-01 NOTE — Progress Notes (Signed)
Subjective:  No Further RUE discomfort (anginal equiv)  Objective:  Temp:  [97.7 F (36.5 C)-98.8 F (37.1 C)] 98.1 F (36.7 C) (10/11 0507) Pulse Rate:  [58-75] 75 (10/11 0507) Resp:  [15-20] 18 (10/11 0507) BP: (113-140)/(46-56) 140/46 mmHg (10/11 0507) SpO2:  [95 %-99 %] 98 % (10/11 0507) Weight:  [211 lb 12.8 oz (96.072 kg)-212 lb (96.163 kg)] 211 lb 12.8 oz (96.072 kg) (10/11 0507) Weight change:   Intake/Output from previous day: 10/10 0701 - 10/11 0700 In: 993.3 [P.O.:240; I.V.:753.3] Out: -   Intake/Output from this shift:    Physical Exam: General appearance: alert and no distress Neck: no adenopathy, no carotid bruit, no JVD, supple, symmetrical, trachea midline and thyroid not enlarged, symmetric, no tenderness/mass/nodules Lungs: clear to auscultation bilaterally Heart: regular rate and rhythm, S1, S2 normal, no murmur, click, rub or gallop Extremities: extremities normal, atraumatic, no cyanosis or edema  Lab Results: Results for orders placed or performed during the hospital encounter of 12/31/14 (from the past 48 hour(s))  Basic metabolic panel     Status: Abnormal   Collection Time: 12/31/14  9:00 AM  Result Value Ref Range   Sodium 138 135 - 145 mmol/L   Potassium 4.0 3.5 - 5.1 mmol/L   Chloride 107 101 - 111 mmol/L   CO2 22 22 - 32 mmol/L   Glucose, Bld 106 (H) 65 - 99 mg/dL   BUN 23 (H) 6 - 20 mg/dL   Creatinine, Ser 1.34 (H) 0.44 - 1.00 mg/dL   Calcium 8.9 8.9 - 10.3 mg/dL   GFR calc non Af Amer 38 (L) >60 mL/min   GFR calc Af Amer 44 (L) >60 mL/min    Comment: (NOTE) The eGFR has been calculated using the CKD EPI equation. This calculation has not been validated in all clinical situations. eGFR's persistently <60 mL/min signify possible Chronic Kidney Disease.    Anion gap 9 5 - 15  CBC     Status: Abnormal   Collection Time: 12/31/14  9:00 AM  Result Value Ref Range   WBC 11.7 (H) 4.0 - 10.5 K/uL   RBC 3.38 (L) 3.87 - 5.11 MIL/uL     Hemoglobin 10.5 (L) 12.0 - 15.0 g/dL   HCT 31.7 (L) 36.0 - 46.0 %   MCV 93.8 78.0 - 100.0 fL   MCH 31.1 26.0 - 34.0 pg   MCHC 33.1 30.0 - 36.0 g/dL   RDW 14.0 11.5 - 15.5 %   Platelets 261 150 - 400 K/uL  I-stat troponin, ED     Status: None   Collection Time: 12/31/14  9:09 AM  Result Value Ref Range   Troponin i, poc 0.00 0.00 - 0.08 ng/mL   Comment 3            Comment: Due to the release kinetics of cTnI, a negative result within the first hours of the onset of symptoms does not rule out myocardial infarction with certainty. If myocardial infarction is still suspected, repeat the test at appropriate intervals.   TSH     Status: None   Collection Time: 12/31/14 12:15 PM  Result Value Ref Range   TSH 3.247 0.350 - 4.500 uIU/mL  TSH     Status: None   Collection Time: 12/31/14  2:24 PM  Result Value Ref Range   TSH 2.857 0.350 - 4.500 uIU/mL  Troponin I     Status: None   Collection Time: 12/31/14  2:24 PM  Result Value Ref  Range   Troponin I <0.03 <0.031 ng/mL    Comment:        NO INDICATION OF MYOCARDIAL INJURY.   Protime-INR     Status: None   Collection Time: 12/31/14  2:24 PM  Result Value Ref Range   Prothrombin Time 14.3 11.6 - 15.2 seconds   INR 1.09 0.00 - 1.49  Glucose, capillary     Status: Abnormal   Collection Time: 12/31/14  4:35 PM  Result Value Ref Range   Glucose-Capillary 128 (H) 65 - 99 mg/dL   Comment 1 Notify RN   Troponin I     Status: None   Collection Time: 12/31/14  6:30 PM  Result Value Ref Range   Troponin I <0.03 <0.031 ng/mL    Comment:        NO INDICATION OF MYOCARDIAL INJURY.   Heparin level (unfractionated)     Status: None   Collection Time: 12/31/14  9:20 PM  Result Value Ref Range   Heparin Unfractionated 0.36 0.30 - 0.70 IU/mL    Comment:        IF HEPARIN RESULTS ARE BELOW EXPECTED VALUES, AND PATIENT DOSAGE HAS BEEN CONFIRMED, SUGGEST FOLLOW UP TESTING OF ANTITHROMBIN III LEVELS.   Troponin I     Status: None    Collection Time: 01/01/15  1:13 AM  Result Value Ref Range   Troponin I <0.03 <0.031 ng/mL    Comment:        NO INDICATION OF MYOCARDIAL INJURY.   Comprehensive metabolic panel     Status: Abnormal   Collection Time: 01/01/15  1:13 AM  Result Value Ref Range   Sodium 135 135 - 145 mmol/L   Potassium 3.8 3.5 - 5.1 mmol/L   Chloride 103 101 - 111 mmol/L   CO2 22 22 - 32 mmol/L   Glucose, Bld 106 (H) 65 - 99 mg/dL   BUN 29 (H) 6 - 20 mg/dL   Creatinine, Ser 1.11 (H) 0.44 - 1.00 mg/dL   Calcium 8.9 8.9 - 10.3 mg/dL   Total Protein 6.4 (L) 6.5 - 8.1 g/dL   Albumin 3.3 (L) 3.5 - 5.0 g/dL   AST 18 15 - 41 U/L   ALT 13 (L) 14 - 54 U/L   Alkaline Phosphatase 60 38 - 126 U/L   Total Bilirubin 0.4 0.3 - 1.2 mg/dL   GFR calc non Af Amer 47 (L) >60 mL/min   GFR calc Af Amer 55 (L) >60 mL/min    Comment: (NOTE) The eGFR has been calculated using the CKD EPI equation. This calculation has not been validated in all clinical situations. eGFR's persistently <60 mL/min signify possible Chronic Kidney Disease.    Anion gap 10 5 - 15  Heparin level (unfractionated)     Status: Abnormal   Collection Time: 01/01/15  1:13 AM  Result Value Ref Range   Heparin Unfractionated 0.22 (L) 0.30 - 0.70 IU/mL    Comment:        IF HEPARIN RESULTS ARE BELOW EXPECTED VALUES, AND PATIENT DOSAGE HAS BEEN CONFIRMED, SUGGEST FOLLOW UP TESTING OF ANTITHROMBIN III LEVELS.   CBC     Status: Abnormal   Collection Time: 01/01/15  1:13 AM  Result Value Ref Range   WBC 11.8 (H) 4.0 - 10.5 K/uL   RBC 3.43 (L) 3.87 - 5.11 MIL/uL   Hemoglobin 10.5 (L) 12.0 - 15.0 g/dL   HCT 32.0 (L) 36.0 - 46.0 %   MCV 93.3 78.0 - 100.0 fL  MCH 30.6 26.0 - 34.0 pg   MCHC 32.8 30.0 - 36.0 g/dL   RDW 14.1 11.5 - 15.5 %   Platelets 260 150 - 400 K/uL  Lipid panel     Status: Abnormal   Collection Time: 01/01/15  1:13 AM  Result Value Ref Range   Cholesterol 168 0 - 200 mg/dL   Triglycerides 332 (H) <150 mg/dL   HDL 54  >40 mg/dL   Total CHOL/HDL Ratio 3.1 RATIO   VLDL 66 (H) 0 - 40 mg/dL   LDL Cholesterol 48 0 - 99 mg/dL    Comment:        Total Cholesterol/HDL:CHD Risk Coronary Heart Disease Risk Table                     Men   Women  1/2 Average Risk   3.4   3.3  Average Risk       5.0   4.4  2 X Average Risk   9.6   7.1  3 X Average Risk  23.4   11.0        Use the calculated Patient Ratio above and the CHD Risk Table to determine the patient's CHD Risk.        ATP III CLASSIFICATION (LDL):  <100     mg/dL   Optimal  100-129  mg/dL   Near or Above                    Optimal  130-159  mg/dL   Borderline  160-189  mg/dL   High  >190     mg/dL   Very High     Imaging: Imaging results have been reviewed  Assessment/Plan:   1. Active Problems: 2.   Unstable angina ([place]) 3.   Time Spent Directly with Patient:  15 minutes  Length of Stay:  LOS: 1 day   Enz neg. S/P RCA SVG DES 2 weeks ago admitted yesterday with similar Sx. On IV hep. Exam benign. For relook cath today. If neg can D/C home later this PM.  Quay Burow 01/01/2015, 10:28 AM

## 2015-01-01 NOTE — Progress Notes (Signed)
Right femoral sheath removed and pressure held for 20 minutes by Victory Dakin RTR. Groin level 0. Right pedal pulse palpable. Down time begins at 1:20 PM. No apparent complications. Satira Sark RTR.

## 2015-01-02 ENCOUNTER — Telehealth: Payer: Self-pay | Admitting: Cardiovascular Disease

## 2015-01-02 LAB — POCT ACTIVATED CLOTTING TIME: Activated Clotting Time: 122 seconds

## 2015-01-02 NOTE — Telephone Encounter (Signed)
D/C phone call .Marland Kitchen Appt is on 01/10/15 at 10:10 w/ Richardson Dopp at the [facility] office   Thanks

## 2015-01-02 NOTE — Telephone Encounter (Signed)
Patient contacted regarding discharge from [facility] on 01/01/2015.  Patient understands to follow up with provider on S. Kathlen Mody, PA at 10:10am at [facility]. Patient understands discharge instructions? YES Patient understands medications and regiment? YES Patient understands to bring all medications to this visit? YES  Patient reports she had a BM last nite. The first part was normal brown and the last part was darker. She reports she was given "blood thinner" (heparin?) in the hospital. Patient denies weakness/lightheadedness. She feels fine. She will continue to monitor and call back if needed.

## 2015-01-08 ENCOUNTER — Other Ambulatory Visit: Payer: Self-pay | Admitting: Interventional Cardiology

## 2015-01-09 NOTE — Progress Notes (Signed)
Cardiology Office Note   Date:  01/10/2015   ID:  Erin Ingram, Erin Ingram [birth year], MRN [id number]  PCP:  [name], MD  Cardiologist:  Dr. Casandra Doffing   Electrophysiologist:  n/a  Chief Complaint  Patient presents with  . Hospitalization Follow-up    [place] >> s/p PCI  . Coronary Artery Disease     History of Present Illness: Erin Ingram is a 74 y.o. female with a hx of CAD status post CABG in 2000, HTN, HL, sleep apnea, PMR, L subclavian stenosis. She had repeat cardiac catheterization in 2008 that demonstrated patent grafts. She's had a history of persistent angina treated with EECP many years ago. She's been treated with Ranexa, metoprolol, amlodipine, isosorbide. Last seen by Dr. Irish Lack 1/16.  Admitted 9/28-9/29 with worsening angina. LHC demonstrated 90% distal SVG-RCA stenosis. This was treated with 2 overlapping Synergy DES. SVG-OM2 was patent. There is a Y graft with a LIMA-LAD and SVG-diagonal. LIMA portion was patent but SVG-diagonal was occluded.  Readmitted 10/10-10/11 with back and right arm pain similar to previous angina. She had resolution with nitroglycerin. Cardiac enzymes remained normal. Relook cardiac catheterization demonstrated patent stents in the vein graft to the RCA and otherwise stable anatomy. Continued medical therapy was recommended.    Returns for FU.  Overall doing well since discharge from the hospital. She denies any further anginal symptoms. She does note dyspnea with exertion. Overall, this is improved since discharge from the hospital. She denies any orthopnea or PND. She has mild pedal edema without change. She denies syncope. She denies any bleeding issues.   Studies/Reports Reviewed Today:  LHC 01/01/15  Severe native three vessel CAD.  Y graft. LIMA to LAD (patent); SVG to diagonal (occluded).  Patent SVG to OM.  SVG to RCA graft and recently placed stents are widely patent.  Normal LVEDP.   Echo 01/01/15 Moderate  LVH, EF 55-60%, normal wall motion, MAC, mild LAE   LHC 12/19/14 LAD: Proximal 25%, distal 90% LCx: Diffuse 90% RCA: Mid 90% SVG-OM2 okay with luminal irregularities Y graft LIMA-LAD and SVG-diagonal: LIMA patent, SVG-diagonal occluded SVG-RPDA: 90% distal PCI: 3.5 x 28 mm and 3.5 x 8 mm Synergy DES to the SVG-RPDA  Severe native three vessel CAD.  Y graft with LIMA-LAD and SVG-Dx. LIMA portion patent, vein graft to diagonal is occluded.  The SVG to OM is widely patent. The SVG to RCA has a 90% distal lesion.  The SVG-RCA lesion in the distal graft txwith overlapping DES, 3.5 x 28 and 3.5 x 8 mm Synergy stents. Continue dual antiplatelet therapy for at least a year. She'll be watched overnight. Will continue IV Angiomax for a few hours while the Brilinta is becoming therapeutic. Plan for discharge tomorrow. She will benefit from aggressive secondary prevention including cardiac rehabilitation, blood pressure control and weight loss. Left subclavian stenosis which prevents left radial access to the aorta       Past Medical History  Diagnosis Date  . Coronary artery disease     a. s/p CABG 2000 (SVG/ free LIMA Y graft to the diagonal and distal LAD, SVG to the OM 1, and SVG to the PDA). Cath 12/19/2014 90% dSVG to RCA s/p 2 overlapping DES (3.5 x 28 and 3.5 x 8 mm Synergy)  . Hyperlipidemia   . OSA on CPAP   . Depression     with anxious component  . Obesity   . Stable angina (HCC)     microvascular, improved with  Ranexa  . PMR (polymyalgia rheumatica) [facility])     Past Surgical History  Procedure Laterality Date  . Cataract extraction Bilateral 2011  . Breast biopsy  1964  . Repair of left patellar  1993  . Cardiac catheterization  08    patent grafts, no culprit lesions, EF 65%  . Coronary artery bypass graft  2000    ASCVD, multivessel, S./P.  Marland Kitchen Cardiac catheterization N/A 12/19/2014    Procedure: Left Heart Cath and Cors/Grafts Angiography;  Surgeon: Jettie Booze,  MD;  Location: [facility];  Service: Cardiovascular;  Laterality: N/A;  . Cardiac catheterization N/A 12/19/2014    Procedure: Coronary Stent Intervention;  Surgeon: Jettie Booze, MD;  Location: [facility];  Service: Cardiovascular;  Laterality: N/A;  . Cardiac catheterization N/A 01/01/2015    Procedure: Left Heart Cath and Cors/Grafts Angiography;  Surgeon: Jettie Booze, MD;  Location: [facility];  Service: Cardiovascular;  Laterality: N/A;     Current Outpatient Prescriptions  Medication Sig Dispense Refill  . amLODipine (NORVASC) 2.5 MG tablet Take 1 tablet (2.5 mg total) by mouth daily. 30 tablet 9  . aspirin 81 MG chewable tablet Chew 1 tablet (81 mg total) by mouth daily.    Marland Kitchen atorvastatin (LIPITOR) 10 MG tablet Take 10 mg by mouth daily.    . Cholecalciferol (VITAMIN D PO) Take 1 tablet by mouth daily.    . Coenzyme Q10 10 MG capsule Take 10 mg by mouth daily.    Marland Kitchen econazole nitrate 1 % cream Apply 1 application topically 2 (two) times daily as needed (rash in groin).    . fluocinonide cream (LIDEX) 9.21 % Apply 1 application topically 2 (two) times daily as needed (rash).     . fluticasone (FLONASE) 50 MCG/ACT nasal spray Place 2 sprays into both nostrils daily as needed for allergies.     . isosorbide mononitrate (IMDUR) 60 MG 24 hr tablet Take 2 tablets by mouth  once a day 180 tablet 3  . levocetirizine (XYZAL) 5 MG tablet Take 5 mg by mouth every evening.    Marland Kitchen levothyroxine (SYNTHROID, LEVOTHROID) 75 MCG tablet Take 75 mcg by mouth every evening.     . metoprolol succinate (TOPROL-XL) 100 MG 24 hr tablet Take 1 tablet by mouth once a day 90 tablet 0  . nitroGLYCERIN (NITROSTAT) 0.4 MG SL tablet Place 1 tablet (0.4 mg total) under the tongue every 5 (five) minutes as needed for chest pain. 25 tablet 3  . Omega-3 Fatty Acids (FISH OIL PO) Take 1 tablet by mouth daily.     . predniSONE (DELTASONE) 1 MG tablet Take 1 mg by mouth daily with  breakfast.    . Psyllium 400 MG CAPS Take 400 mg by mouth daily.    Marland Kitchen RANEXA 1000 MG SR tablet Take 1 tablet by mouth  twice a day 180 tablet 0  . ticagrelor (BRILINTA) 90 MG TABS tablet Take 1 tablet (90 mg total) by mouth 2 (two) times daily. 180 tablet 3  . venlafaxine XR (EFFEXOR-XR) 75 MG 24 hr capsule Take 225 mg by mouth daily with breakfast. Patient takes 3 capsules     No current facility-administered medications for this visit.    Allergies:   Bisoprolol; Codeine; Demerol; Imipramine; Stadol; Statins; Tequin; and Septra    Social History:  The patient  reports that she quit smoking about 45 years ago. She has never used smokeless tobacco. She reports that she drinks  alcohol. She reports that she does not use illicit drugs.   Family History:  The patient's family history includes Diabetes in her brother; Heart disease in her father and mother.    ROS:   Please see the history of present illness.   Review of Systems  Constitution: Positive for malaise/fatigue.  Cardiovascular: Positive for dyspnea on exertion and leg swelling.  Musculoskeletal: Positive for joint pain.  All other systems reviewed and are negative.     PHYSICAL EXAM: VS:  BP 100/40 mmHg  Pulse 64  Ht 4\' 10"  (1.473 m)  Wt 212 lb 6.4 oz (96.344 kg)  BMI 44.40 kg/m2    Wt Readings from Last 3 Encounters:  01/10/15 212 lb 6.4 oz (96.344 kg)  01/01/15 211 lb 12.8 oz (96.072 kg)  12/20/14 217 lb 2.5 oz (98.5 kg)     GEN: Well nourished, well developed, in no acute distress HEENT: normal Neck: no JVD, no masses Cardiac:  Normal S1/S2, RRR; no murmur ,  no rubs or gallops, trace bilateral LE edema; R groin without hematoma or bruit  Respiratory:  clear to auscultation bilaterally, no wheezing, rhonchi or rales. GI: soft, nontender, nondistended, + BS MS: no deformity or atrophy Skin: warm and dry  Neuro:  CNs II-XII intact, Strength and sensation are intact Psych: Normal affect   EKG:  EKG is  ordered today.  It demonstrates:   NSR, HR 64, NSSTTW changes, QTc 427 ms, no significant changes.   Recent Labs: 12/31/2014: TSH 2.857 01/01/2015: ALT 13*; BUN 29*; Creatinine, Ser 1.11*; Hemoglobin 10.5*; Platelets 260; Potassium 3.8; Sodium 135    Lipid Panel    Component Value Date/Time   CHOL 168 01/01/2015 0113   TRIG 332* 01/01/2015 0113   HDL 54 01/01/2015 0113   CHOLHDL 3.1 01/01/2015 0113   VLDL 66* 01/01/2015 0113   LDLCALC 48 01/01/2015 0113      ASSESSMENT AND PLAN:  1. CAD:  S/p CABG.  Recent admission for [place] with LHC demonstrated severe S-RCA disease which was treated with overlapping DES x 2.  Her Y-graft demonstrated patent L-LAD limb and occluded S-Dx limb and there was patent S-OM.  Re-look cath done 01/01/15 demonstrated patent stents in the S-RCA and stable anatomy.  She is now doing well without further angina. We discussed the importance of dual antiplatelet therapy for a minimum of 1 year. Continue aspirin, Brilinta, Ranexa, amlodipine, isosorbide, statin. She would like to resume cardiac rehabilitation. A referral will be made.  2. HTN:  Blood pressure controlled.  3. Hyperlipidemia:  Continue statin. She cannot tolerate higher doses of statin therapy.  4. L Subclavian Stenosis: Noted on [facility] 12/19/14.  She is unable to undergo cath from L radial access.  She denies any left arm claudication or steal syndrome symptoms.     Medication Changes: Current medicines are reviewed at length with the patient today.  Concerns regarding medicines are as outlined above.  The following changes have been made:   Discontinued Medications   No medications on file   Modified Medications   No medications on file   New Prescriptions   No medications on file   Labs/ tests ordered today include:   Orders Placed This Encounter  Procedures  . EKG 12-Lead      Disposition:    FU with Dr. Irish Lack 2-3 mos    Signed, Versie Starks, MHS 01/10/2015 [place], [place]  [address] Phone: [phone]; Fax: [phone]

## 2015-01-10 ENCOUNTER — Encounter: Payer: Self-pay | Admitting: Physician Assistant

## 2015-01-10 ENCOUNTER — Ambulatory Visit (INDEPENDENT_AMBULATORY_CARE_PROVIDER_SITE_OTHER): Payer: Medicare Other | Admitting: Physician Assistant

## 2015-01-10 ENCOUNTER — Other Ambulatory Visit: Payer: Self-pay | Admitting: Interventional Cardiology

## 2015-01-10 VITALS — BP 100/40 | HR 64 | Ht <= 58 in | Wt 212.4 lb

## 2015-01-10 DIAGNOSIS — E785 Hyperlipidemia, unspecified: Secondary | ICD-10-CM | POA: Diagnosis not present

## 2015-01-10 DIAGNOSIS — I771 Stricture of artery: Secondary | ICD-10-CM

## 2015-01-10 DIAGNOSIS — I708 Atherosclerosis of other arteries: Secondary | ICD-10-CM | POA: Diagnosis not present

## 2015-01-10 DIAGNOSIS — I2510 Atherosclerotic heart disease of native coronary artery without angina pectoris: Secondary | ICD-10-CM

## 2015-01-10 DIAGNOSIS — G4733 Obstructive sleep apnea (adult) (pediatric): Secondary | ICD-10-CM

## 2015-01-10 DIAGNOSIS — I10 Essential (primary) hypertension: Secondary | ICD-10-CM

## 2015-01-10 NOTE — Patient Instructions (Signed)
Medication Instructions:  Your physician recommends that you continue on your current medications as directed. Please refer to the Current Medication list given to you today.   Labwork: NONE  Testing/Procedures: NONE  Follow-Up: 04/11/15 @ 1:45 WITH DR. VARANASI  Any Other Special Instructions Will Be Listed Below (If Applicable). CARDIAC REHAB AT [facility]

## 2015-01-18 ENCOUNTER — Telehealth: Payer: Self-pay | Admitting: Interventional Cardiology

## 2015-01-18 NOTE — Telephone Encounter (Signed)
Left message to call back. [facility] and they do not offer cardiac rehab.

## 2015-01-18 NOTE — Telephone Encounter (Signed)
New Prob   Pt calling to speak to someone regarding permission to have cardiac rehab at [facility]. Please call.

## 2015-01-18 NOTE — Telephone Encounter (Signed)
Patient called back about her message. Informed patient that [facility] does not have cardiac rehab. Patient is waiting to hear back from [facility]. Informed patient from my point of view, it appears she is ready to have them schedule her first appointment. Patient stated she would give them a call.

## 2015-02-04 ENCOUNTER — Other Ambulatory Visit: Payer: Self-pay | Admitting: Interventional Cardiology

## 2015-02-21 ENCOUNTER — Encounter (HOSPITAL_COMMUNITY)
Admission: RE | Admit: 2015-02-21 | Discharge: 2015-02-21 | Disposition: A | Discharge status: Home / Self Care | Payer: Medicare Other | Source: Ambulatory Visit | Attending: Interventional Cardiology | Admitting: Interventional Cardiology

## 2015-02-21 DIAGNOSIS — Z955 Presence of coronary angioplasty implant and graft: Secondary | ICD-10-CM | POA: Insufficient documentation

## 2015-02-25 ENCOUNTER — Encounter (HOSPITAL_COMMUNITY)
Admission: RE | Admit: 2015-02-25 | Discharge: 2015-02-25 | Disposition: A | Discharge status: Home / Self Care | Payer: Medicare Other | Source: Ambulatory Visit | Attending: Interventional Cardiology | Admitting: Interventional Cardiology

## 2015-02-25 ENCOUNTER — Encounter (HOSPITAL_COMMUNITY): Payer: Self-pay

## 2015-02-25 DIAGNOSIS — Z955 Presence of coronary angioplasty implant and graft: Secondary | ICD-10-CM | POA: Diagnosis present

## 2015-02-25 NOTE — Progress Notes (Addendum)
Pt started cardiac rehab today.  Pt tolerated light exercise without difficulty. VSS, telemetry-sinus rhythm, asymptomatic.  Medication list reconciled.  Pt verbalized compliance with medications and denies barriers to compliance. PSYCHOSOCIAL ASSESSMENT:  PHQ-1, pt does admit to occasional depressive symptoms however overall positive coping skills with hopeful outlook.  Pt lives at [facility].   No psychosocial needs identified at this time, no psychosocial interventions necessary.    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 Pt did not return initial homework packet and instructed to bring back to next scheduled visit.    Pt cardiac rehab  goal is  to decrease shortness of breath and improve hip bursitis symptoms.  Pt encouraged to participate in home exercise as well as cardiac rehab activities to increase ability to achieve these goals.  Pt instructed bursitis may not resolve with cardiac rehab however encouraged to incorporate activities that include flexibilty and core strengthening  to diminish symptoms and avoid injury.    Pt oriented to exercise equipment and routine.  Understanding verbalized.

## 2015-02-27 ENCOUNTER — Encounter (HOSPITAL_COMMUNITY)
Admission: RE | Admit: 2015-02-27 | Discharge: 2015-02-27 | Disposition: A | Discharge status: Home / Self Care | Payer: Medicare Other | Source: Ambulatory Visit | Attending: Interventional Cardiology | Admitting: Interventional Cardiology

## 2015-02-27 DIAGNOSIS — Z955 Presence of coronary angioplasty implant and graft: Secondary | ICD-10-CM | POA: Diagnosis not present

## 2015-03-01 ENCOUNTER — Encounter (HOSPITAL_COMMUNITY)
Admission: RE | Admit: 2015-03-01 | Discharge: 2015-03-01 | Disposition: A | Discharge status: Home / Self Care | Payer: Medicare Other | Source: Ambulatory Visit | Attending: Interventional Cardiology | Admitting: Interventional Cardiology

## 2015-03-01 DIAGNOSIS — Z955 Presence of coronary angioplasty implant and graft: Secondary | ICD-10-CM | POA: Diagnosis not present

## 2015-03-04 ENCOUNTER — Encounter (HOSPITAL_COMMUNITY)
Admission: RE | Admit: 2015-03-04 | Discharge: 2015-03-04 | Disposition: A | Discharge status: Home / Self Care | Payer: Medicare Other | Source: Ambulatory Visit | Attending: Interventional Cardiology | Admitting: Interventional Cardiology

## 2015-03-04 DIAGNOSIS — Z955 Presence of coronary angioplasty implant and graft: Secondary | ICD-10-CM | POA: Diagnosis not present

## 2015-03-06 ENCOUNTER — Encounter (HOSPITAL_COMMUNITY)
Admission: RE | Admit: 2015-03-06 | Discharge: 2015-03-06 | Disposition: A | Discharge status: Home / Self Care | Payer: Medicare Other | Source: Ambulatory Visit | Attending: Interventional Cardiology | Admitting: Interventional Cardiology

## 2015-03-06 DIAGNOSIS — Z955 Presence of coronary angioplasty implant and graft: Secondary | ICD-10-CM | POA: Diagnosis not present

## 2015-03-06 NOTE — Progress Notes (Signed)
QUALITY OF LIFE SCORE REVIEW  Pt completed Quality of Life survey as a participant in Cardiac Rehab. Scores 21.0 or below are considered low. Pt score very low in several areas Health and Function 17.   Patient quality of life slightly altered by physical constraints which limits ability to perform as prior to recent cardiac illness.  Pt reports her symptoms of dyspnea and energy are improving post stent placement. Pt reports other areas of her life are satisfactory with no needs or concerns.   Offered emotional support and reassurance.  Will continue to monitor and intervene as necessary.

## 2015-03-08 ENCOUNTER — Encounter (HOSPITAL_COMMUNITY)
Admission: RE | Admit: 2015-03-08 | Discharge: 2015-03-08 | Disposition: A | Discharge status: Home / Self Care | Payer: Medicare Other | Source: Ambulatory Visit | Attending: Interventional Cardiology | Admitting: Interventional Cardiology

## 2015-03-08 ENCOUNTER — Telehealth: Payer: Self-pay | Admitting: Interventional Cardiology

## 2015-03-08 DIAGNOSIS — Z955 Presence of coronary angioplasty implant and graft: Secondary | ICD-10-CM | POA: Diagnosis not present

## 2015-03-08 NOTE — Progress Notes (Signed)
Pt arrived at cardiac rehab c/o petechiae rash on both legs from ankle to thigh.  Pt c/o itching around ankles only.  Rare spots noted on abdomen and both arms.  Heavy rash present on both legs. Pt advised per Dr. Irish Lack triage nurse to take benadryl and pepcid.  Pt willing to do this.  PC to pt PCP for evaluation.  No appt available however pt offered care at afterhours clinic at [place] today.  Pt declined this offer stating she would rather try benadryl and pepcid and seek care on Monday if symptoms persist. Pt instructed to seek care this weekend if symptoms worsen.  Pt verbalized understanding.

## 2015-03-08 NOTE — Telephone Encounter (Signed)
Pt c/o medication issue:  1. Name of Medication: brilinta  2. How are you currently taking this medication (dosage and times per day)? 2x day 3. Are you having a reaction (difficulty breathing--STAT)? no  4. What is your medication issue? Rash, on her legs

## 2015-03-08 NOTE — Telephone Encounter (Signed)
Calling stating she has a rash and itching that started around ankles 2 days ago and has progressed up to her thighs. Rash mainly on back of her legs; not on her truck. States only new medication is Brilinta.  According to her records the Kary Kos was started in October when she had stents placed. States she has not taken Benadryl only used cortisone cream for itching. Has not used any other new products ie laundry detergent.  Spoke w/Sally Earle,pharmacist who states unlikely to be Brilinta. Suggested she use the Benadryl and Pepcid or Zantac.  If does not improve then needs to see PCP.  Stressed importance of staying on Brilinta due to having 2 stents.  States she has cardiac rehab this afternoon and may have the nurse there look at rash.  Advised that rash from Brilinta usually starts in chest area.  She verbalizes understanding and will get the Benadryl and Pepcid.

## 2015-03-11 ENCOUNTER — Telehealth: Payer: Self-pay | Admitting: Interventional Cardiology

## 2015-03-11 ENCOUNTER — Encounter (HOSPITAL_COMMUNITY)
Admission: RE | Admit: 2015-03-11 | Discharge: 2015-03-11 | Disposition: A | Discharge status: Home / Self Care | Payer: Medicare Other | Source: Ambulatory Visit | Attending: Interventional Cardiology | Admitting: Interventional Cardiology

## 2015-03-11 DIAGNOSIS — Z955 Presence of coronary angioplasty implant and graft: Secondary | ICD-10-CM | POA: Diagnosis not present

## 2015-03-11 NOTE — Progress Notes (Signed)
Reviewed home exercise with pt today.  Pt plans to use fitness facility and pool for exercise. Reviewed THR, pulse, RPE, sign and symptoms, NTG use, and when to call 911 or MD.  Pt voiced understanding.   Erin Ingram [employer]

## 2015-03-11 NOTE — Telephone Encounter (Signed)
New message     Pt has a rash on both legs from ankle to knee.  Could this be medication related?

## 2015-03-11 NOTE — Telephone Encounter (Signed)
Not sure what is most likely to cause a rash.  I am ok with switching Brilinta to clopidogrel if you think that is the cause.

## 2015-03-11 NOTE — Telephone Encounter (Signed)
SPOKE WITH [name]  PT  WENT  TO URGENT CARE  OVER WEEKEND FOR    RASH  BRILINTA  WAS  STARTED  IN September  FOR  STENT PLACEMENT PER  [name] RETIRED  DERMATOLOGIST THAT  VOLUNTEERS   LOOKED  AT  RASH   AND THOUGHT LOOKED LIKE  MED  REACTION  PT  CURRENTLY ON  PREDNISONE THERAPY   ITCHING  IS  BETTER   WILL  FORWARD  TO DR Irish Lack  FOR  RECOMMENDATIONS .Adonis Housekeeper

## 2015-03-12 NOTE — Telephone Encounter (Signed)
**Note De-identified  Obfuscation** LMTCB

## 2015-03-13 ENCOUNTER — Encounter (HOSPITAL_COMMUNITY)
Admission: RE | Admit: 2015-03-13 | Discharge: 2015-03-13 | Disposition: A | Discharge status: Home / Self Care | Payer: Medicare Other | Source: Ambulatory Visit | Attending: Interventional Cardiology | Admitting: Interventional Cardiology

## 2015-03-13 DIAGNOSIS — Z955 Presence of coronary angioplasty implant and graft: Secondary | ICD-10-CM | POA: Diagnosis not present

## 2015-03-13 MED ORDER — CLOPIDOGREL BISULFATE 75 MG PO TABS: 75.0000 mg | ORAL_TABLET | Freq: Every day | ORAL | Status: DC

## 2015-03-13 NOTE — Telephone Encounter (Signed)
**Note De-Identified  Obfuscation** The pt is advised and she wants to stop taking Brilinta and start taking Plavix. Per her request I have sent her Plavix RX to [place] to deliver to her nursing facility today. She is advised to take Brilinta as directed today then stop taking and to begin taking Plavix 75 mg tomorrow. She verbalized understanding. Brilinta has been added to the pts allergy list.

## 2015-03-15 ENCOUNTER — Encounter (HOSPITAL_COMMUNITY)
Admission: RE | Admit: 2015-03-15 | Discharge: 2015-03-15 | Disposition: A | Discharge status: Home / Self Care | Payer: Medicare Other | Source: Ambulatory Visit | Attending: Interventional Cardiology | Admitting: Interventional Cardiology

## 2015-03-15 DIAGNOSIS — Z955 Presence of coronary angioplasty implant and graft: Secondary | ICD-10-CM | POA: Diagnosis not present

## 2015-03-20 ENCOUNTER — Encounter (HOSPITAL_COMMUNITY): Payer: Medicare Other

## 2015-03-22 ENCOUNTER — Encounter (HOSPITAL_COMMUNITY)
Admission: RE | Admit: 2015-03-22 | Discharge: 2015-03-22 | Disposition: A | Discharge status: Home / Self Care | Payer: Medicare Other | Source: Ambulatory Visit | Attending: Interventional Cardiology | Admitting: Interventional Cardiology

## 2015-03-22 DIAGNOSIS — Z955 Presence of coronary angioplasty implant and graft: Secondary | ICD-10-CM | POA: Diagnosis not present

## 2015-03-22 NOTE — Progress Notes (Signed)
Patient graduated from Phase 2 cardiac rehab today. She met her goal of completing 10 sessions. She feels positive about her rehab experience and admitted not quite in the diet mode due to the holidays. She plans to continue exercise post cardiac rehab at the gym where she lives as well as making use of her [employer]. She feels cardiac rehab has provided her with some good results and pleased with the follow up regarding the medication change to Plavix and much better experience with no CP due to Ranexa.  PHQ score 0 and medications reviewed.

## 2015-03-25 ENCOUNTER — Encounter (HOSPITAL_COMMUNITY): Payer: Medicare Other

## 2015-03-27 ENCOUNTER — Encounter (HOSPITAL_COMMUNITY): Payer: Medicare Other

## 2015-03-31 ENCOUNTER — Other Ambulatory Visit: Payer: Self-pay | Admitting: Interventional Cardiology

## 2015-04-09 ENCOUNTER — Other Ambulatory Visit: Payer: Self-pay | Admitting: Nephrology

## 2015-04-09 DIAGNOSIS — N183 Chronic kidney disease, stage 3 unspecified: Secondary | ICD-10-CM

## 2015-04-09 DIAGNOSIS — I129 Hypertensive chronic kidney disease with stage 1 through stage 4 chronic kidney disease, or unspecified chronic kidney disease: Secondary | ICD-10-CM

## 2015-04-09 DIAGNOSIS — N189 Chronic kidney disease, unspecified: Secondary | ICD-10-CM

## 2015-04-09 DIAGNOSIS — D631 Anemia in chronic kidney disease: Secondary | ICD-10-CM

## 2015-04-11 ENCOUNTER — Ambulatory Visit (INDEPENDENT_AMBULATORY_CARE_PROVIDER_SITE_OTHER): Payer: Medicare Other | Admitting: Interventional Cardiology

## 2015-04-11 ENCOUNTER — Encounter: Payer: Self-pay | Admitting: Interventional Cardiology

## 2015-04-11 VITALS — BP 114/58 | HR 68 | Ht <= 58 in | Wt 215.0 lb

## 2015-04-11 DIAGNOSIS — I10 Essential (primary) hypertension: Secondary | ICD-10-CM

## 2015-04-11 DIAGNOSIS — E782 Mixed hyperlipidemia: Secondary | ICD-10-CM

## 2015-04-11 DIAGNOSIS — I25708 Atherosclerosis of coronary artery bypass graft(s), unspecified, with other forms of angina pectoris: Secondary | ICD-10-CM

## 2015-04-11 DIAGNOSIS — I739 Peripheral vascular disease, unspecified: Secondary | ICD-10-CM | POA: Diagnosis not present

## 2015-04-11 NOTE — Progress Notes (Signed)
Patient ID: Erin Ingram, female   DOB: [birth year], 75 y.o.   MRN: [id number]     Cardiology Office Note   Date:  04/11/2015   ID:  Erin Ingram, DOB [birth year], MRN [id number]  PCP:  Mickie Hillier, MD    No chief complaint on file. CAD   Wt Readings from Last 3 Encounters:  04/11/15 215 lb (97.523 kg)  02/21/15 216 lb 7.9 oz (98.2 kg)  01/10/15 212 lb 6.4 oz (96.344 kg)       History of Present Illness: Erin Ingram is a 75 y.o. female  with a hx of CAD status post CABG in 2000, HTN, HL, sleep apnea, PMR, L subclavian stenosis. She had repeat cardiac catheterization in 2008 that demonstrated patent grafts. She's had a history of persistent angina treated with EECP many years ago. She's been treated with Ranexa, metoprolol, amlodipine, isosorbide.   Admitted 9/28-9/29 with worsening angina. LHC demonstrated 90% distal SVG-RCA stenosis. This was treated with 2 overlapping Synergy DES. SVG-OM2 was patent. There is a Y graft with a LIMA-LAD and SVG-diagonal. LIMA portion was patent but SVG-diagonal was occluded.  Readmitted 10/10-10/11/16 with back and right arm pain similar to previous angina. She had resolution with nitroglycerin. Cardiac enzymes remained normal. Relook cardiac catheterization demonstrated patent stents in the vein graft to the RCA and otherwise stable anatomy. Continued medical therapy was recommended.    She denies any further anginal symptoms. She does note dyspnea with exertion. It is better than it was before the most recent stent placement.She denies any orthopnea or PND. She has mild pedal edema without change. She denies syncope. She denies any bleeding issues.  She does Tai Chi 2x/week. She walks a little but not much.  SHe is limited by PMR. She reacted to the Brilinta, with itching rash.  It was changed to clopidorel.  She is doing better on the plavix.   Studies/Reports Reviewed Today:  LHC 01/01/15  Severe native three vessel CAD.  Y  graft. LIMA to LAD (patent); SVG to diagonal (occluded).  Patent SVG to OM.  SVG to RCA graft and recently placed stents are widely patent.  Normal LVEDP.   Echo 01/01/15 Moderate LVH, EF 55-60%, normal wall motion, MAC, mild LAE     Past Medical History  Diagnosis Date  . Coronary artery disease     a. s/p CABG 2000 (SVG/ free LIMA Y graft to the diagonal and distal LAD, SVG to the OM 1, and SVG to the PDA). Cath 12/19/2014 90% dSVG to RCA s/p 2 overlapping DES (3.5 x 28 and 3.5 x 8 mm Synergy)  . Hyperlipidemia   . OSA on CPAP   . Depression     with anxious component  . Obesity   . Stable angina (HCC)     microvascular, improved with Ranexa  . PMR (polymyalgia rheumatica) [facility])     Past Surgical History  Procedure Laterality Date  . Cataract extraction Bilateral 2011  . Breast biopsy  1964  . Repair of left patellar  1993  . Cardiac catheterization  08    patent grafts, no culprit lesions, EF 65%  . Coronary artery bypass graft  2000    ASCVD, multivessel, S./P.  Marland Kitchen Cardiac catheterization N/A 12/19/2014    Procedure: Left Heart Cath and Cors/Grafts Angiography;  Surgeon: Corky Crafts, MD;  Location: [facility] INVASIVE CV LAB;  Service: Cardiovascular;  Laterality: N/A;  . Cardiac catheterization N/A 12/19/2014    Procedure: Coronary Stent  Intervention;  Surgeon: Jettie Booze, MD;  Location: [place] CV LAB;  Service: Cardiovascular;  Laterality: N/A;  . Cardiac catheterization N/A 01/01/2015    Procedure: Left Heart Cath and Cors/Grafts Angiography;  Surgeon: Jettie Booze, MD;  Location: [place] CV LAB;  Service: Cardiovascular;  Laterality: N/A;     Current Outpatient Prescriptions  Medication Sig Dispense Refill  . Melatonin 1 MG TABS Take 5 mg by mouth at bedtime as needed (SLEEP). Reported on 03/22/2015    . amLODipine (NORVASC) 2.5 MG tablet Take 1 tablet by mouth  daily 90 tablet 2  . aspirin 81 MG chewable tablet Chew 1 tablet (81 mg  total) by mouth daily.    Marland Kitchen atorvastatin (LIPITOR) 10 MG tablet Take 10 mg by mouth daily.    . Cholecalciferol (VITAMIN D PO) Take 1 tablet by mouth daily.    . Cinnamon 500 MG TABS Take 1 each by mouth daily.    . clopidogrel (PLAVIX) 75 MG tablet Take 1 tablet (75 mg total) by mouth daily. 90 tablet 1  . Coenzyme Q10 10 MG capsule Take 10 mg by mouth daily.    . fluocinonide cream (LIDEX) 7.42 % Apply 1 application topically 2 (two) times daily as needed (rash).     . fluticasone (FLONASE) 50 MCG/ACT nasal spray Place 2 sprays into both nostrils daily as needed for allergies.     . isosorbide mononitrate (IMDUR) 60 MG 24 hr tablet Take 2 tablets by mouth  once a day 180 tablet 3  . levocetirizine (XYZAL) 5 MG tablet Take 5 mg by mouth every evening.    Marland Kitchen levothyroxine (SYNTHROID, LEVOTHROID) 75 MCG tablet Take 75 mcg by mouth every evening.     . metoprolol succinate (TOPROL-XL) 100 MG 24 hr tablet Take 1 tablet by mouth once a day 90 tablet 1  . nitroGLYCERIN (NITROSTAT) 0.4 MG SL tablet Place 1 tablet (0.4 mg total) under the tongue every 5 (five) minutes as needed for chest pain. 25 tablet 3  . Omega-3 Fatty Acids (FISH OIL PO) Take 1 tablet by mouth daily.     . predniSONE (DELTASONE) 1 MG tablet Take 2 mg by mouth daily with breakfast.     . Psyllium 400 MG CAPS Take 400 mg by mouth daily.    Marland Kitchen RANEXA 1000 MG SR tablet Take 1 tablet by mouth  twice a day 180 tablet 0  . venlafaxine XR (EFFEXOR-XR) 75 MG 24 hr capsule Take 225 mg by mouth daily with breakfast. Patient takes 3 capsules     No current facility-administered medications for this visit.    Allergies:   Stadol; Sulfa antibiotics; Bisoprolol fumarate; Butorphanol tartrate; Codeine; Demerol; Imipramine; Statins; Tequin; Brilinta; Pseudoephedrine hcl; Septra; and Sulfamethoxazole-trimethoprim    Social History:  The patient  reports that she quit smoking about 45 years ago. She has never used smokeless tobacco. She reports  that she drinks alcohol. She reports that she does not use illicit drugs.   Family History:  The patient's family history includes Diabetes in her brother; Heart attack in her father and mother; Heart disease in her father and mother; Stroke in her paternal grandmother. There is no history of Hypertension.    ROS:  Please see the history of present illness.   Otherwise, review of systems are positive for chronic DOE.   All other systems are reviewed and negative.    PHYSICAL EXAM: VS:  BP 114/58 mmHg  Pulse 68  Ht 4'  9.75" (1.467 m)  Wt 215 lb (97.523 kg)  BMI 45.32 kg/m2 , BMI Body mass index is 45.32 kg/(m^2). GEN: Well nourished, well developed, in no acute distress HEENT: normal Neck: no JVD, carotid bruits, or masses Cardiac: RRR; no murmurs, rubs, or gallops,no edema  Respiratory:  clear to auscultation bilaterally, normal work of breathing GI: soft, nontender, nondistended, + BS MS: no deformity or atrophy Skin: warm and dry, no rash Neuro:  Strength and sensation are intact Psych: euthymic mood, full affect   Recent Labs: 12/31/2014: TSH 2.857 01/01/2015: ALT 13*; BUN 29*; Creatinine, Ser 1.11*; Hemoglobin 10.5*; Platelets 260; Potassium 3.8; Sodium 135   Lipid Panel    Component Value Date/Time   CHOL 168 01/01/2015 0113   TRIG 332* 01/01/2015 0113   HDL 54 01/01/2015 0113   CHOLHDL 3.1 01/01/2015 0113   VLDL 66* 01/01/2015 0113   LDLCALC 48 01/01/2015 0113     Other studies Reviewed: Additional studies/ records that were reviewed today with results demonstrating: cath reports reviewed.   ASSESSMENT AND PLAN:   CAD:  S/p CABG and recent DES to SVG to RCA in 2016.  No further angina.  DOing well.   Finished cardiac rehab.  Encouraged to exercise.  Continue atorvastatin.  Lipids reviewed and well controlled.   PAD: Known left subclavian stenosis.  Cannot do cath from left radial access site despite 2+ left radial pulse.  HTN: BP controlled.   Tolerating  clopidogrel.  Allergic to Brilinta.   Current medicines are reviewed at length with the patient today.  The patient concerns regarding her medicines were addressed.  The following changes have been made:  No change  Labs/ tests ordered today include:  No orders of the defined types were placed in this encounter.    Recommend 150 minutes/week of aerobic exercise Low fat, low carb, high fiber diet recommended  Disposition:   FU in  1 year   Teresita Madura., MD  04/11/2015 2:05 [facility] [place], [place], [place]  [address] Phone: [phone]; Fax: [phone]

## 2015-04-11 NOTE — Patient Instructions (Signed)
Medication Instructions:  Same-no changes  Labwork: none  Testing/Procedures: None  Follow-Up: Your physician wants you to follow-up in: 1 year. You will receive a reminder letter in the mail two months in advance. If you don't receive a letter, please call our office to schedule the follow-up appointment.      If you need a refill on your cardiac medications before your next appointment, please call your pharmacy.

## 2015-04-17 ENCOUNTER — Ambulatory Visit
Admission: RE | Admit: 2015-04-17 | Discharge: 2015-04-17 | Disposition: A | Discharge status: Home / Self Care | Payer: Medicare Other | Source: Ambulatory Visit | Attending: Nephrology | Admitting: Nephrology

## 2015-04-17 DIAGNOSIS — N189 Chronic kidney disease, unspecified: Secondary | ICD-10-CM

## 2015-04-17 DIAGNOSIS — N183 Chronic kidney disease, stage 3 unspecified: Secondary | ICD-10-CM

## 2015-04-17 DIAGNOSIS — I129 Hypertensive chronic kidney disease with stage 1 through stage 4 chronic kidney disease, or unspecified chronic kidney disease: Secondary | ICD-10-CM

## 2015-04-17 DIAGNOSIS — D631 Anemia in chronic kidney disease: Secondary | ICD-10-CM

## 2015-04-28 ENCOUNTER — Other Ambulatory Visit: Payer: Self-pay | Admitting: Interventional Cardiology

## 2015-04-28 ENCOUNTER — Telehealth: Payer: Self-pay | Admitting: Physician Assistant

## 2015-04-28 MED ORDER — RANOLAZINE ER 1000 MG PO TB12: 1000.0000 mg | ORAL_TABLET | Freq: Two times a day (BID) | ORAL | Status: DC

## 2015-04-28 NOTE — Telephone Encounter (Signed)
Rx of Renexa sent to [facility].  [address] [place], [place]

## 2015-08-19 ENCOUNTER — Other Ambulatory Visit: Payer: Self-pay | Admitting: Interventional Cardiology

## 2015-08-25 ENCOUNTER — Other Ambulatory Visit: Payer: Self-pay | Admitting: Interventional Cardiology

## 2015-09-17 ENCOUNTER — Observation Stay (HOSPITAL_COMMUNITY)
Admission: EM | Admit: 2015-09-17 | Discharge: 2015-09-21 | Disposition: A | Discharge status: Home / Self Care | Payer: Medicare Other | Attending: Internal Medicine | Admitting: Internal Medicine

## 2015-09-17 ENCOUNTER — Telehealth: Payer: Self-pay | Admitting: Interventional Cardiology

## 2015-09-17 ENCOUNTER — Emergency Department (HOSPITAL_COMMUNITY): Payer: Medicare Other

## 2015-09-17 ENCOUNTER — Other Ambulatory Visit: Payer: Self-pay | Admitting: Interventional Cardiology

## 2015-09-17 ENCOUNTER — Encounter (HOSPITAL_COMMUNITY): Payer: Self-pay | Admitting: Emergency Medicine

## 2015-09-17 DIAGNOSIS — Z955 Presence of coronary angioplasty implant and graft: Secondary | ICD-10-CM | POA: Insufficient documentation

## 2015-09-17 DIAGNOSIS — I5032 Chronic diastolic (congestive) heart failure: Secondary | ICD-10-CM | POA: Insufficient documentation

## 2015-09-17 DIAGNOSIS — I110 Hypertensive heart disease with heart failure: Secondary | ICD-10-CM | POA: Insufficient documentation

## 2015-09-17 DIAGNOSIS — Z7902 Long term (current) use of antithrombotics/antiplatelets: Secondary | ICD-10-CM | POA: Diagnosis not present

## 2015-09-17 DIAGNOSIS — Z87891 Personal history of nicotine dependence: Secondary | ICD-10-CM | POA: Diagnosis not present

## 2015-09-17 DIAGNOSIS — E039 Hypothyroidism, unspecified: Secondary | ICD-10-CM | POA: Diagnosis not present

## 2015-09-17 DIAGNOSIS — Z7982 Long term (current) use of aspirin: Secondary | ICD-10-CM | POA: Insufficient documentation

## 2015-09-17 DIAGNOSIS — F329 Major depressive disorder, single episode, unspecified: Secondary | ICD-10-CM | POA: Insufficient documentation

## 2015-09-17 DIAGNOSIS — K5900 Constipation, unspecified: Secondary | ICD-10-CM | POA: Diagnosis not present

## 2015-09-17 DIAGNOSIS — M353 Polymyalgia rheumatica: Secondary | ICD-10-CM | POA: Diagnosis not present

## 2015-09-17 DIAGNOSIS — Z6841 Body Mass Index (BMI) 40.0 and over, adult: Secondary | ICD-10-CM | POA: Diagnosis not present

## 2015-09-17 DIAGNOSIS — I25810 Atherosclerosis of coronary artery bypass graft(s) without angina pectoris: Secondary | ICD-10-CM | POA: Diagnosis not present

## 2015-09-17 DIAGNOSIS — Z7952 Long term (current) use of systemic steroids: Secondary | ICD-10-CM | POA: Insufficient documentation

## 2015-09-17 DIAGNOSIS — I2582 Chronic total occlusion of coronary artery: Secondary | ICD-10-CM | POA: Diagnosis not present

## 2015-09-17 DIAGNOSIS — Z951 Presence of aortocoronary bypass graft: Secondary | ICD-10-CM | POA: Insufficient documentation

## 2015-09-17 DIAGNOSIS — E669 Obesity, unspecified: Secondary | ICD-10-CM | POA: Insufficient documentation

## 2015-09-17 DIAGNOSIS — E785 Hyperlipidemia, unspecified: Secondary | ICD-10-CM | POA: Diagnosis not present

## 2015-09-17 DIAGNOSIS — Z79899 Other long term (current) drug therapy: Secondary | ICD-10-CM | POA: Diagnosis not present

## 2015-09-17 DIAGNOSIS — R079 Chest pain, unspecified: Secondary | ICD-10-CM | POA: Diagnosis not present

## 2015-09-17 DIAGNOSIS — I25110 Atherosclerotic heart disease of native coronary artery with unstable angina pectoris: Secondary | ICD-10-CM | POA: Diagnosis not present

## 2015-09-17 DIAGNOSIS — I771 Stricture of artery: Secondary | ICD-10-CM | POA: Diagnosis not present

## 2015-09-17 DIAGNOSIS — I739 Peripheral vascular disease, unspecified: Secondary | ICD-10-CM | POA: Insufficient documentation

## 2015-09-17 DIAGNOSIS — I200 Unstable angina: Secondary | ICD-10-CM | POA: Diagnosis present

## 2015-09-17 DIAGNOSIS — G4733 Obstructive sleep apnea (adult) (pediatric): Secondary | ICD-10-CM | POA: Diagnosis not present

## 2015-09-17 LAB — BASIC METABOLIC PANEL
Anion gap: 7 (ref 5–15)
BUN: 19 mg/dL (ref 6–20)
CO2: 25 mmol/L (ref 22–32)
Calcium: 9.2 mg/dL (ref 8.9–10.3)
Chloride: 104 mmol/L (ref 101–111)
Creatinine, Ser: 1.08 mg/dL — ABNORMAL HIGH (ref 0.44–1.00)
GFR calc Af Amer: 56 mL/min — ABNORMAL LOW (ref >60)
GFR calc non Af Amer: 49 mL/min — ABNORMAL LOW (ref >60)
Glucose, Bld: 144 mg/dL — ABNORMAL HIGH (ref 65–99)
Potassium: 4.1 mmol/L (ref 3.5–5.1)
Sodium: 136 mmol/L (ref 135–145)

## 2015-09-17 LAB — CBC
HCT: 33.8 % — ABNORMAL LOW (ref 36.0–46.0)
Hemoglobin: 10.7 g/dL — ABNORMAL LOW (ref 12.0–15.0)
MCH: 28.8 pg (ref 26.0–34.0)
MCHC: 31.7 g/dL (ref 30.0–36.0)
MCV: 90.9 fL (ref 78.0–100.0)
Platelets: 258 10*3/uL (ref 150–400)
RBC: 3.72 MIL/uL — ABNORMAL LOW (ref 3.87–5.11)
RDW: 14.6 % (ref 11.5–15.5)
WBC: 11.8 10*3/uL — ABNORMAL HIGH (ref 4.0–10.5)

## 2015-09-17 LAB — I-STAT TROPONIN, ED: Troponin i, poc: 0 ng/mL (ref 0.00–0.08)

## 2015-09-17 MED ORDER — ASPIRIN 81 MG PO CHEW
324.0000 mg | CHEWABLE_TABLET | Freq: Once | ORAL | Status: AC
  Administered 2015-09-18: 324 mg via ORAL
  Filled 2015-09-17: qty 4

## 2015-09-17 NOTE — Telephone Encounter (Signed)
Pt having symptoms of Angina since last night-pls advise [phone]

## 2015-09-17 NOTE — Telephone Encounter (Signed)
Called pt x 2 the verizen service said that this number was either changed disconnected or not in service.

## 2015-09-17 NOTE — ED Provider Notes (Signed)
CSN: [id number]     Arrival date & time 09/17/15  2000 History   First MD Initiated Contact with Patient 09/17/15 2024     Chief Complaint  Patient presents with  . Chest Pain    HPI Comments: 75 y.o. female past history of coronary artery disease status post CABG and stents presents to the ED with left shoulder pain rating to her left hand, and left sided jaw pain. Relieved by nitroglycerin. Worsened by exercising. No associated symptoms. Currently denies pain.  Patient is a 75 y.o. female presenting with general illness. The history is provided by the patient.  Illness Location:  Left shoulder, left jaw Quality:  Aching, tight Severity:  Severe Onset quality:  Sudden Duration: Since "the middle of the night" Timing:  Intermittent Progression:  Waxing and waning Chronicity:  New Context:  History of coronary artery disease, left shoulder and left jaw pain are her anginal equivalents Relieved by:  Nitroglycerin Worsened by:  Exercising (tai chi) Ineffective treatments:  None Associated symptoms: no abdominal pain, no cough, no loss of consciousness, no nausea, no shortness of breath and no vomiting   Risk factors:  CAD   Past Medical History  Diagnosis Date  . Coronary artery disease     a. s/p CABG 2000 (SVG/ free LIMA Y graft to the diagonal and distal LAD, SVG to the OM 1, and SVG to the PDA). Cath 12/19/2014 90% dSVG to RCA s/p 2 overlapping DES (3.5 x 28 and 3.5 x 8 mm Synergy)  . Hyperlipidemia   . OSA on CPAP   . Depression     with anxious component  . Obesity   . Stable angina (HCC)     microvascular, improved with Ranexa  . PMR (polymyalgia rheumatica) [facility])    Past Surgical History  Procedure Laterality Date  . Cataract extraction Bilateral 2011  . Breast biopsy  1964  . Repair of left patellar  1993  . Cardiac catheterization  08    patent grafts, no culprit lesions, EF 65%  . Coronary artery bypass graft  2000    ASCVD, multivessel, S./P.  Marland Kitchen Cardiac  catheterization N/A 12/19/2014    Procedure: Left Heart Cath and Cors/Grafts Angiography;  Surgeon: Jettie Booze, MD;  Location: [place] CV LAB;  Service: Cardiovascular;  Laterality: N/A;  . Cardiac catheterization N/A 12/19/2014    Procedure: Coronary Stent Intervention;  Surgeon: Jettie Booze, MD;  Location: [place] CV LAB;  Service: Cardiovascular;  Laterality: N/A;  . Cardiac catheterization N/A 01/01/2015    Procedure: Left Heart Cath and Cors/Grafts Angiography;  Surgeon: Jettie Booze, MD;  Location: [place] CV LAB;  Service: Cardiovascular;  Laterality: N/A;   Family History  Problem Relation Age of Onset  . Heart disease Mother   . Heart disease Father   . Diabetes Brother   . Heart attack Mother   . Heart attack Father   . Hypertension Neg Hx   . Stroke Paternal Grandmother    Social History  Substance Use Topics  . Smoking status: Former Smoker    Quit date: 08/21/1969  . Smokeless tobacco: Never Used  . Alcohol Use: 0.0 oz/week    0 Standard drinks or equivalent per week     Comment: Glass of wine 2-3 x year   OB History    No data available     Review of Systems  Respiratory: Negative for cough and shortness of breath.   Gastrointestinal: Negative for nausea, vomiting  and abdominal pain.  Neurological: Negative for loss of consciousness.      Allergies  Stadol; Sulfa antibiotics; Bisoprolol fumarate; Butorphanol tartrate; Codeine; Demerol; Imipramine; Statins; Tequin; Brilinta; Pseudoephedrine hcl; Septra; and Sulfamethoxazole-trimethoprim  Home Medications   Prior to Admission medications   Medication Sig Start Date End Date Taking? Authorizing Provider  amLODipine (NORVASC) 2.5 MG tablet Take 1 tablet by mouth  daily 02/05/15   Jettie Booze, MD  aspirin 81 MG chewable tablet Chew 1 tablet (81 mg total) by mouth daily. 12/20/14   Almyra Deforest, PA  atorvastatin (LIPITOR) 10 MG tablet Take 10 mg by mouth daily.    Historical  Provider, MD  Cholecalciferol (VITAMIN D PO) Take 1 tablet by mouth daily.    Historical Provider, MD  Cinnamon 500 MG TABS Take 1 each by mouth daily.    Historical Provider, MD  clopidogrel (PLAVIX) 75 MG tablet TAKE 1 TABLET ONCE DAILY. 09/17/15   Jettie Booze, MD  Coenzyme Q10 10 MG capsule Take 10 mg by mouth daily.    Historical Provider, MD  fluocinonide cream (LIDEX) 4.09 % Apply 1 application topically 2 (two) times daily as needed (rash).     Historical Provider, MD  fluticasone (FLONASE) 50 MCG/ACT nasal spray Place 2 sprays into both nostrils daily as needed for allergies.     Historical Provider, MD  isosorbide mononitrate (IMDUR) 60 MG 24 hr tablet Take 2 tablets by mouth  once a day 01/10/15   Jettie Booze, MD  levocetirizine (XYZAL) 5 MG tablet Take 5 mg by mouth every evening.    Historical Provider, MD  levothyroxine (SYNTHROID, LEVOTHROID) 75 MCG tablet Take 75 mcg by mouth every evening.     Historical Provider, MD  Melatonin 1 MG TABS Take 5 mg by mouth at bedtime as needed (SLEEP). Reported on 03/22/2015    Historical Provider, MD  metoprolol succinate (TOPROL-XL) 100 MG 24 hr tablet Take 1 tablet by mouth once a day 08/26/15   Jettie Booze, MD  nitroGLYCERIN (NITROSTAT) 0.4 MG SL tablet Place 1 tablet (0.4 mg total) under the tongue every 5 (five) minutes as needed for chest pain. 12/20/14   Almyra Deforest, PA  Omega-3 Fatty Acids (FISH OIL PO) Take 1 tablet by mouth daily.     Historical Provider, MD  predniSONE (DELTASONE) 1 MG tablet Take 2 mg by mouth daily with breakfast.     Historical Provider, MD  Psyllium 400 MG CAPS Take 400 mg by mouth daily.    Historical Provider, MD  RANEXA 1000 MG SR tablet Take 1 tablet by mouth  twice a day 08/20/15   Jettie Booze, MD  venlafaxine XR (EFFEXOR-XR) 75 MG 24 hr capsule Take 225 mg by mouth daily with breakfast. Patient takes 3 capsules    Historical Provider, MD   BP 120/50 mmHg  Pulse 78  Temp(Src) 98.5 F  (36.9 C) (Oral)  Resp 15  SpO2 98% Physical Exam  Constitutional: She appears well-developed and well-nourished. No distress.  HENT:  Head: Normocephalic and atraumatic.  Right Ear: External ear normal.  Left Ear: External ear normal.  Neck: Normal range of motion.  Cardiovascular: Normal rate, regular rhythm and intact distal pulses.   Pulmonary/Chest: Effort normal and breath sounds normal.  Abdominal: Soft. She exhibits no distension. There is no tenderness.  Neurological: She is alert.  Face symmetric, speech clear and appropriate, moves all extremities spontaneously  Skin: Skin is warm and dry. She is not diaphoretic.  Psychiatric: She has a normal mood and affect.  Vitals reviewed.   ED Course  Procedures (including critical care time) Labs Review Labs Reviewed  BASIC METABOLIC PANEL - Abnormal; Notable for the following:    Glucose, Bld 144 (*)    Creatinine, Ser 1.08 (*)    GFR calc non Af Amer 49 (*)    GFR calc Af Amer 56 (*)    All other components within normal limits  CBC - Abnormal; Notable for the following:    WBC 11.8 (*)    RBC 3.72 (*)    Hemoglobin 10.7 (*)    HCT 33.8 (*)    All other components within normal limits  I-STAT TROPOININ, ED    Imaging Review Dg Chest 2 View  09/17/2015  CLINICAL DATA:  75 year old female with chest pain EXAM: CHEST  2 VIEW COMPARISON:  Chest radiograph dated 12/31/2014 FINDINGS: Two views of the chest do not demonstrate focal consolidation. There is no pleural effusion or pneumothorax. Stable cardiac silhouette. Median sternotomy wires and CABG vascular clips. No acute osseous pathology. IMPRESSION: No active cardiopulmonary disease. Electronically Signed   By: Anner Crete M.D.   On: 09/17/2015 21:38   I have personally reviewed and evaluated these images and lab results as part of my medical decision-making.   EKG Interpretation   Date/Time:  Tuesday September 17 2015 20:16:32 EDT Ventricular Rate:  75 PR  Interval:    QRS Duration: 78 QT Interval:  406 QTC Calculation: 454 R Axis:   5 Text Interpretation:  Sinus rhythm Low voltage, precordial leads Abnormal  R-wave progression, early transition Confirmed by ZAVITZ MD, JOSHUA  [phone]) on 09/17/2015 8:19:55 PM      MDM   Final diagnoses:  Unstable angina ([place])    75 y.o. female presents with left-sided shoulder pain rating down her arm associated with left jaw pain. I'm concerned that this is unstable angina. She has taken 8 nitroglycerin throughout the day today with relief, however the pain returns. Remainder as above.  EKG is without acute ischemic changes. Her initial troponin is 0. CBC and CMP are unremarkable.  I discussed her case with the hospitalist, and she was admitted to a telemetry bed. Cardiology was consulted.  She was given aspirin. Chest x-ray unremarkable.  Case managed in conjunction with my attending, Dr. Reather Converse.    Berenice Primas, MD 09/18/15 [id number]  Elnora Morrison, MD 09/19/15 [phone]

## 2015-09-17 NOTE — Consult Note (Signed)
CARDIOLOGY INPATIENT CONSULTATION NOTE  Patient ID: DWANNA GOSHERT MRN: [id number], DOB/AGE: February 07, 1941   Admit date: 09/17/2015   Primary Physician: [name], MD Primary Cardiologist: Larae Grooms MD  Reason for Consult:   Chest pain   Requesting Physician: Sheran Luz MD  HPI: This is a 75 y.o. 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 She is a challenging pt w/chronic angina.  Patient is admitted under triad service and is being managed for r/o ACS. Pt w/prior CABG presented with CP ongoing since yesterday, usually stays for 15s, present mostly in the left jaw and left arm, 5/10, similar to pain she had last yr in 11/2014, when she presented with UA, had DES x 2 to 90% SVG-RCA. SVG to D1 was occluded. On 01/01/2015, she again presented with similar sx and relook angio did not show any new changes. She went to cardiac rehab after that for 3 mo w/some improvement in functional capacity and CP. She continued to have mild symptoms since the admission until today when she had to take NTG x 8. She has poor functional capacity, DOE @ 150 ft. Walks with walker. Lives w/friend at an independent living facility. Teaches Tai-chi.   Problem List: Past Medical History  Diagnosis Date  . Coronary artery disease     a. s/p CABG 2000 (SVG/ free LIMA Y graft to the diagonal and distal LAD, SVG to the OM 1, and SVG to the PDA). Cath 12/19/2014 90% dSVG to RCA s/p 2 overlapping DES (3.5 x 28 and 3.5 x 8 mm Synergy)  . Hyperlipidemia   . OSA on CPAP   . Depression     with anxious component  . Obesity   . Stable angina (HCC)     microvascular, improved with Ranexa  . PMR (polymyalgia rheumatica) [facility])     Past Surgical  History  Procedure Laterality Date  . Cataract extraction Bilateral 2011  . Breast biopsy  1964  . Repair of left patellar  1993  . Cardiac catheterization  08    patent grafts, no culprit lesions, EF 65%  . Coronary artery bypass graft  2000    ASCVD, multivessel, S./P.  Marland Kitchen Cardiac catheterization N/A 12/19/2014    Procedure: Left Heart Cath and Cors/Grafts Angiography;  Surgeon: Jettie Booze, MD;  Location: [place] [facility];  Service: Cardiovascular;  Laterality: N/A;  . Cardiac catheterization N/A 12/19/2014    Procedure: Coronary Stent Intervention;  Surgeon: Jettie Booze, MD;  Location: [place] [facility];  Service: Cardiovascular;  Laterality: N/A;  . Cardiac catheterization N/A 01/01/2015    Procedure: Left Heart Cath and Cors/Grafts Angiography;  Surgeon: Jettie Booze, MD;  Location: [place] [facility];  Service: Cardiovascular;  Laterality: N/A;     Allergies:  Allergies  Allergen Reactions  . Stadol [Butorphanol] Other (See Comments)    agitation Constipation  . Sulfa Antibiotics Rash  . Bisoprolol Fumarate Other (See Comments)    Doesn't remember  . Butorphanol Tartrate Other (See Comments)    Produced a lot of urine, made patient feel crazy  . Codeine Nausea And Vomiting  . Demerol [Meperidine] Nausea And Vomiting  . Imipramine     Sweating, facial dysfunction   .  Statins     MYALGIAS  . Tequin [Gatifloxacin] Other (See Comments)    Caused hypoglycemia Low blood sugar  . Brilinta [Ticagrelor] Rash    CAUSES PETECHIAE, PURPURA  . Pseudoephedrine Hcl Palpitations  . Septra [Sulfamethoxazole-Trimethoprim] Rash  . Sulfamethoxazole-Trimethoprim Rash     Home Medications Current Facility-Administered Medications  Medication Dose Route Frequency Provider Last Rate Last Dose  . aspirin chewable tablet 324 mg  324 mg Oral Once Berenice Primas, MD       Current Outpatient Prescriptions  Medication Sig Dispense Refill  . amLODipine (NORVASC) 2.5 MG  tablet Take 1 tablet by mouth  daily 90 tablet 2  . aspirin 81 MG chewable tablet Chew 1 tablet (81 mg total) by mouth daily.    Marland Kitchen atorvastatin (LIPITOR) 10 MG tablet Take 10 mg by mouth daily.    . Calcium Glycerophosphate (PRELIEF PO) Take 1 tablet by mouth 2 (two) times daily with a meal.    . Cholecalciferol (VITAMIN D PO) Take 1 tablet by mouth daily.    . Cinnamon 500 MG TABS Take 1 each by mouth daily.    . clopidogrel (PLAVIX) 75 MG tablet TAKE 1 TABLET ONCE DAILY. 90 tablet 2  . Coenzyme Q10 10 MG capsule Take 10 mg by mouth daily.    . fluocinonide cream (LIDEX) 5.05 % Apply 1 application topically 2 (two) times daily as needed (rash).     . fluticasone (FLONASE) 50 MCG/ACT nasal spray Place 2 sprays into both nostrils daily as needed for allergies.     . isosorbide mononitrate (IMDUR) 60 MG 24 hr tablet Take 2 tablets by mouth  once a day 180 tablet 3  . levocetirizine (XYZAL) 5 MG tablet Take 5 mg by mouth every evening.    Marland Kitchen levothyroxine (SYNTHROID, LEVOTHROID) 75 MCG tablet Take 75 mcg by mouth every evening.     . Melatonin 1 MG TABS Take 5 mg by mouth at bedtime as needed (SLEEP). Reported on 03/22/2015    . metoprolol succinate (TOPROL-XL) 100 MG 24 hr tablet Take 1 tablet by mouth once a day 90 tablet 1  . nitroGLYCERIN (NITROSTAT) 0.4 MG SL tablet Place 1 tablet (0.4 mg total) under the tongue every 5 (five) minutes as needed for chest pain. 25 tablet 3  . Omega-3 Fatty Acids (FISH OIL PO) Take 1 tablet by mouth daily.     . predniSONE (DELTASONE) 1 MG tablet Take 10 mg by mouth daily with breakfast.     . Psyllium 400 MG CAPS Take 400 mg by mouth daily.    Marland Kitchen RANEXA 1000 MG SR tablet Take 1 tablet by mouth  twice a day 180 tablet 2  . venlafaxine XR (EFFEXOR-XR) 75 MG 24 hr capsule Take 225 mg by mouth daily with breakfast. Patient takes 3 capsules       Family History  Problem Relation Age of Onset  . Heart disease Mother   . Heart disease Father   . Diabetes Brother    . Heart attack Mother   . Heart attack Father   . Hypertension Neg Hx   . Stroke Paternal Grandmother      Social History   Social History  . Marital Status: Married    Spouse Name: N/A  . Number of Children: N/A  . Years of Education: N/A   Occupational History  . Retired Cytogeneticist    Social History Main Topics  . Smoking status: Former Smoker    Quit date:  08/21/1969  . Smokeless tobacco: Never Used  . Alcohol Use: 0.0 oz/week    0 Standard drinks or equivalent per week     Comment: Glass of wine 2-3 x year  . Drug Use: No  . Sexual Activity: Not on file   Other Topics Concern  . Not on file   Social History Narrative   Lives in [place] with husband.      Review of Systems: General: fatigue increase weight negative for chills, fever, night sweats  Cardiovascular: chest pain, leg swelling, on prednisone for PMR Dermatological: negative for rash Respiratory:no cough, fevers, chills Urologic: negative for hematuria Abdominal: negative for nausea, vomiting, diarrhea, bright red blood per rectum, melena, or hematemesis Neurologic: negative for visual changes, syncope, or dizziness Hematology: no anemia Psychiatry: non suicidal/homicidal  Musculoskeletal: back spams   Physical Exam: Vitals: BP 136/60 mmHg  Pulse 69  Temp(Src) 98.5 F (36.9 C) (Oral)  Resp 18  SpO2 98% General: not in acute distress Neck: JVP flat, neck supple Heart: regular rate and rhythm, S1, S2, no murmurs  Lungs: CTAB  GI: obese, non tender, non distended, bowel sounds present Extremities: no edema Neuro: AAO x 3  Psych: normal affect, no anxiety   Labs:   Results for orders placed or performed during the hospital encounter of 09/17/15 (from the past 24 hour(s))  Basic metabolic panel     Status: Abnormal   Collection Time: 09/17/15  8:17 PM  Result Value Ref Range   Sodium 136 135 - 145 mmol/L   Potassium 4.1 3.5 - 5.1 mmol/L   Chloride 104 101 - 111 mmol/L    CO2 25 22 - 32 mmol/L   Glucose, Bld 144 (H) 65 - 99 mg/dL   BUN 19 6 - 20 mg/dL   Creatinine, Ser 1.08 (H) 0.44 - 1.00 mg/dL   Calcium 9.2 8.9 - 10.3 mg/dL   GFR calc non Af Amer 49 (L) >60 mL/min   GFR calc Af Amer 56 (L) >60 mL/min   Anion gap 7 5 - 15  CBC     Status: Abnormal   Collection Time: 09/17/15  8:17 PM  Result Value Ref Range   WBC 11.8 (H) 4.0 - 10.5 K/uL   RBC 3.72 (L) 3.87 - 5.11 MIL/uL   Hemoglobin 10.7 (L) 12.0 - 15.0 g/dL   HCT 33.8 (L) 36.0 - 46.0 %   MCV 90.9 78.0 - 100.0 fL   MCH 28.8 26.0 - 34.0 pg   MCHC 31.7 30.0 - 36.0 g/dL   RDW 14.6 11.5 - 15.5 %   Platelets 258 150 - 400 K/uL  I-stat troponin, ED     Status: None   Collection Time: 09/17/15  8:21 PM  Result Value Ref Range   Troponin i, poc 0.00 0.00 - 0.08 ng/mL   Comment 3             Radiology/Studies: Dg Chest 2 View  09/17/2015  CLINICAL DATA:  75 year old female with chest pain EXAM: CHEST  2 VIEW COMPARISON:  Chest radiograph dated 12/31/2014 FINDINGS: Two views of the chest do not demonstrate focal consolidation. There is no pleural effusion or pneumothorax. Stable cardiac silhouette. Median sternotomy wires and CABG vascular clips. No acute osseous pathology. IMPRESSION: No active cardiopulmonary disease. Electronically Signed   By: Anner Crete M.D.   On: 09/17/2015 21:38    EKG: normal sinus rhythm, low voltage EKG, non specific ST/T wave changes  Echo: 01/01/2015 - Left ventricle: The cavity  size was normal. There was moderate  concentric hypertrophy. Systolic function was normal. The  estimated ejection fraction was in the range of 55% to 60%. Wall  motion was normal; there were no regional wall motion  abnormalities. - Mitral valve: Calcified annulus. - Left atrium: The atrium was mildly dilated.  Cardiac cath: 01/01/2015 Dominance: Right   Left Anterior Descending   . Prox LAD to Mid LAD lesion, 25% stenosed.   Jorene Minors LAD lesion, 90% stenosed. diffuse.     Ramus  Intermedius  The vessel is small.     Left Circumflex   . Ost Cx to Dist Cx lesion, 90% stenosed. diffuse.     Right Coronary Artery   . Mid RCA lesion, 90% stenosed.      Free Graft to 2nd Mrg  SVG was injected is normal in caliber. The graft exhibits minimal luminal irregularities.      LIMA Graft to Dist LAD  LIMA is normal in caliber. Y graft with LIMA to LAD and SVG to diagonal. LIMA portion is patent while vein graft to diagonal is occluded.     Free Graft to 2nd Diag  SVG was injected . Y graft. LIMA to LAD (patent); SVG to diagonal (occluded)   . Prox Graft lesion, 100% stenosed.   . Dist Graft lesion, 25% stenosed.     Free Graft to RPDA  SVG was injected is large.   . Dist Graft lesion, 0% stenosed. Previously placed Dist Graft drug eluting stent is patent. Focal 90% with moderate disease on both sides.         Medical decision making:  Discussed care with the patient Discussed care with the triad physician on the phone Reviewed labs and imaging personally Reviewed prior records  ASSESSMENT AND PLAN:  75 y.o. WF with history of microvascular angina s/p CABG '00 (SVG/ free LIMA Y graft to the diagonal, and distal LAD, SVG to the OM 1, and SVG to PDA) s/p EECP and was placed on ranexa, amlodipine, nitrates and metoprolol, PAD (left subclavian stenosis). She is a challenging pt w/chronic angina.   Microvascular angina - r/o ACS, admit for obs, cycle trop, serial EKG, consider ischemic eval, NPO post midnight, lipid panel, HbA1c, TSH Aspirin and statin, cont amlodipine, imdur, dapt, lipitor, ranexa. Consider cardiac rehab/SSRI at discharge if negative for ACS evaluation and outpt follow - up with Dr. Irish Lack PAD - consider looking at cath films to evaluate for LIMA steel syndrome HLD - last LDL 48, well controlled     Signed, Flossie Dibble, MD MS 09/17/2015, 11:25 PM

## 2015-09-17 NOTE — ED Notes (Signed)
Pt presents to ER from home with [facility] for CP that began this morning at 0100; pt states the pain radiates to LEFT arm; pt reports taking 8 SL NTG throughout the day today with some relief "but the pain always comes back, I have a history of angina"; EMS reports giving an addition 2 SL NTG enroute to the ER and VS remained stable; pt denies SOB, N/V, diaphoresis, or lightheadedness

## 2015-09-18 DIAGNOSIS — I25810 Atherosclerosis of coronary artery bypass graft(s) without angina pectoris: Secondary | ICD-10-CM | POA: Diagnosis not present

## 2015-09-18 DIAGNOSIS — I25110 Atherosclerotic heart disease of native coronary artery with unstable angina pectoris: Secondary | ICD-10-CM | POA: Diagnosis not present

## 2015-09-18 DIAGNOSIS — E785 Hyperlipidemia, unspecified: Secondary | ICD-10-CM | POA: Insufficient documentation

## 2015-09-18 DIAGNOSIS — E8779 Other fluid overload: Secondary | ICD-10-CM | POA: Diagnosis not present

## 2015-09-18 DIAGNOSIS — I25700 Atherosclerosis of coronary artery bypass graft(s), unspecified, with unstable angina pectoris: Secondary | ICD-10-CM | POA: Diagnosis not present

## 2015-09-18 DIAGNOSIS — I771 Stricture of artery: Secondary | ICD-10-CM | POA: Insufficient documentation

## 2015-09-18 DIAGNOSIS — I25708 Atherosclerosis of coronary artery bypass graft(s), unspecified, with other forms of angina pectoris: Secondary | ICD-10-CM

## 2015-09-18 DIAGNOSIS — I2582 Chronic total occlusion of coronary artery: Secondary | ICD-10-CM | POA: Diagnosis not present

## 2015-09-18 DIAGNOSIS — R079 Chest pain, unspecified: Secondary | ICD-10-CM | POA: Insufficient documentation

## 2015-09-18 DIAGNOSIS — I200 Unstable angina: Secondary | ICD-10-CM | POA: Diagnosis not present

## 2015-09-18 LAB — LIPID PANEL
Cholesterol: 163 mg/dL (ref 0–200)
HDL: 66 mg/dL (ref >40)
LDL Cholesterol: 66 mg/dL (ref 0–99)
Total CHOL/HDL Ratio: 2.5 RATIO
Triglycerides: 155 mg/dL — ABNORMAL HIGH (ref <150)
VLDL: 31 mg/dL (ref 0–40)

## 2015-09-18 LAB — TROPONIN I
Troponin I: <0.03 ng/mL (ref <0.03)
Troponin I: <0.03 ng/mL (ref <0.03)
Troponin I: <0.03 ng/mL (ref <0.03)

## 2015-09-18 LAB — TSH: TSH: 3.616 u[IU]/mL (ref 0.350–4.500)

## 2015-09-18 MED ORDER — ONDANSETRON HCL 4 MG/2ML IJ SOLN
4.0000 mg | Freq: Four times a day (QID) | INTRAMUSCULAR | Status: DC | PRN
  Administered 2015-09-18: 4 mg via INTRAVENOUS
  Filled 2015-09-18: qty 2

## 2015-09-18 MED ORDER — CLOPIDOGREL BISULFATE 75 MG PO TABS
75.0000 mg | ORAL_TABLET | Freq: Every day | ORAL | Status: DC
  Administered 2015-09-18 – 2015-09-21 (×4): 75 mg via ORAL
  Filled 2015-09-18 (×4): qty 1

## 2015-09-18 MED ORDER — VENLAFAXINE HCL ER 75 MG PO CP24
225.0000 mg | ORAL_CAPSULE | Freq: Every day | ORAL | Status: DC
  Administered 2015-09-18 – 2015-09-21 (×3): 225 mg via ORAL
  Filled 2015-09-18: qty 1
  Filled 2015-09-18 (×2): qty 3
  Filled 2015-09-18 (×2): qty 1

## 2015-09-18 MED ORDER — METOPROLOL SUCCINATE ER 100 MG PO TB24
100.0000 mg | ORAL_TABLET | Freq: Every day | ORAL | Status: DC
  Administered 2015-09-18 – 2015-09-21 (×4): 100 mg via ORAL
  Filled 2015-09-18: qty 1
  Filled 2015-09-18 (×2): qty 4
  Filled 2015-09-18: qty 1

## 2015-09-18 MED ORDER — ATORVASTATIN CALCIUM 10 MG PO TABS
10.0000 mg | ORAL_TABLET | Freq: Every day | ORAL | Status: DC
  Administered 2015-09-18 – 2015-09-21 (×4): 10 mg via ORAL
  Filled 2015-09-18 (×4): qty 1

## 2015-09-18 MED ORDER — PREDNISONE 10 MG PO TABS
10.0000 mg | ORAL_TABLET | Freq: Every day | ORAL | Status: DC
  Administered 2015-09-18 – 2015-09-21 (×3): 10 mg via ORAL
  Filled 2015-09-18 (×4): qty 1

## 2015-09-18 MED ORDER — AMLODIPINE BESYLATE 5 MG PO TABS
5.0000 mg | ORAL_TABLET | Freq: Every day | ORAL | Status: DC
  Administered 2015-09-19 – 2015-09-21 (×3): 5 mg via ORAL
  Filled 2015-09-18 (×3): qty 1

## 2015-09-18 MED ORDER — FLUTICASONE PROPIONATE 50 MCG/ACT NA SUSP: 2.0000 | Freq: Every day | NASAL | Status: DC | PRN

## 2015-09-18 MED ORDER — AMLODIPINE BESYLATE 5 MG PO TABS
2.5000 mg | ORAL_TABLET | Freq: Every day | ORAL | Status: DC
  Administered 2015-09-18: 2.5 mg via ORAL
  Filled 2015-09-18: qty 1

## 2015-09-18 MED ORDER — HYDROMORPHONE HCL 1 MG/ML IJ SOLN
0.5000 mg | Freq: Once | INTRAMUSCULAR | Status: AC
  Administered 2015-09-18: 0.5 mg via INTRAVENOUS
  Filled 2015-09-18: qty 1

## 2015-09-18 MED ORDER — LEVOTHYROXINE SODIUM 75 MCG PO TABS
75.0000 ug | ORAL_TABLET | Freq: Every evening | ORAL | Status: DC
  Administered 2015-09-18 – 2015-09-20 (×3): 75 ug via ORAL
  Filled 2015-09-18 (×4): qty 1

## 2015-09-18 MED ORDER — ENOXAPARIN SODIUM 40 MG/0.4ML ~~LOC~~ SOLN
40.0000 mg | Freq: Every day | SUBCUTANEOUS | Status: DC
  Administered 2015-09-18 – 2015-09-20 (×4): 40 mg via SUBCUTANEOUS
  Filled 2015-09-18 (×4): qty 0.4

## 2015-09-18 MED ORDER — ISOSORBIDE MONONITRATE ER 30 MG PO TB24
30.0000 mg | ORAL_TABLET | Freq: Every day | ORAL | Status: DC
  Administered 2015-09-18 – 2015-09-20 (×3): 30 mg via ORAL
  Filled 2015-09-18 (×3): qty 1

## 2015-09-18 MED ORDER — ACETAMINOPHEN 325 MG PO TABS: 650.0000 mg | ORAL_TABLET | ORAL | Status: DC | PRN

## 2015-09-18 MED ORDER — LEVOCETIRIZINE DIHYDROCHLORIDE 5 MG PO TABS: 5.0000 mg | ORAL_TABLET | Freq: Every evening | ORAL | Status: DC

## 2015-09-18 MED ORDER — ISOSORBIDE MONONITRATE ER 60 MG PO TB24
120.0000 mg | ORAL_TABLET | Freq: Every day | ORAL | Status: DC
  Administered 2015-09-18 – 2015-09-21 (×4): 120 mg via ORAL
  Filled 2015-09-18 (×4): qty 2

## 2015-09-18 MED ORDER — RANOLAZINE ER 500 MG PO TB12
1000.0000 mg | ORAL_TABLET | Freq: Two times a day (BID) | ORAL | Status: DC
  Administered 2015-09-18 – 2015-09-21 (×8): 1000 mg via ORAL
  Filled 2015-09-18 (×8): qty 2

## 2015-09-18 MED ORDER — LORATADINE 10 MG PO TABS
10.0000 mg | ORAL_TABLET | Freq: Every evening | ORAL | Status: DC
  Administered 2015-09-18 – 2015-09-20 (×3): 10 mg via ORAL
  Filled 2015-09-18 (×3): qty 1

## 2015-09-18 MED ORDER — ASPIRIN 81 MG PO CHEW
81.0000 mg | CHEWABLE_TABLET | Freq: Every day | ORAL | Status: DC
  Administered 2015-09-18 – 2015-09-21 (×3): 81 mg via ORAL
  Filled 2015-09-18 (×3): qty 1

## 2015-09-18 NOTE — H&P (Signed)
History and Physical    MAEDELL BLAUSER [id number] DOB: Jan 07, 1941 DOA: 09/17/2015   PCP: [name], MD Chief Complaint:  Chief Complaint  Patient presents with  . Chest Pain    HPI: WAYNETTA WENNERSTROM is a 75 y.o. female with medical history significant of CAD s/p CABG in 2000, multiple stents to bypass grafts in following years most recently 2 overlapping stents done in Sept of last year.  Patient presents to the ED with c/o chest pain.  Pain radiates to L arm and L jaw, 5/10 intensity, NTG makes it slightly better so she has taken 8 NTG tabs over the course of the past day.  Activity causes worsening SOB.  ED Course: Trop negative.  Review of Systems: As per HPI otherwise 10 point review of systems negative.    Past Medical History  Diagnosis Date  . Coronary artery disease     a. s/p CABG 2000 (SVG/ free LIMA Y graft to the diagonal and distal LAD, SVG to the OM 1, and SVG to the PDA). Cath 12/19/2014 90% dSVG to RCA s/p 2 overlapping DES (3.5 x 28 and 3.5 x 8 mm Synergy)  . Hyperlipidemia   . OSA on CPAP   . Depression     with anxious component  . Obesity   . Stable angina (HCC)     microvascular, improved with Ranexa  . PM[facility] (polym[facility]algia rhe[facility]a) [facility])     Past Surgical History  Procedure Laterality Date  . Cataract extraction Bilateral 2011  . Breast biopsy  1964  . Repair of left patellar  1993  . Cardiac catheterization  08    patent grafts, no culprit lesions, EF 65%  . Coronary artery bypass graft  2000    ASCVD, multivessel, S./P.  Marland Kitchen Cardiac catheterization N/A 12/19/2014    Procedure: Left Heart Cath and Cors/Grafts Angiography;  Surgeon: Jettie Booze, MD;  Location: [place] [facility];  Service: Cardiovascular;  Laterality: N/A;  . Cardiac catheterization N/A 12/19/2014    Procedure: Coronary Stent Intervention;  Surgeon: Jettie Booze, MD;  Location: [place] [facility];  Service: Cardiovascular;  Laterality: N/A;  . Cardiac  catheterization N/A 01/01/2015    Procedure: Left Heart Cath and Cors/Grafts Angiography;  Surgeon: Jettie Booze, MD;  Location: [place] [facility];  Service: Cardiovascular;  Laterality: N/A;     reports that she quit smoking about 46 years ago. She has never used smokeless tobacco. She reports that she drinks alcohol. She reports that she does not use illicit drugs.  Allergies  Allergen Reactions  . Stadol [Butorphanol] Other (See Comments)    agitation Constipation  . Sulfa Antibiotics Rash  . Bisoprolol Fumarate Other (See Comments)    Doesn't remember  . Butorphanol Tartrate Other (See Comments)    Produced a lot of urine, made patient feel crazy  . Codeine Nausea And Vomiting  . Demerol [Meperidine] Nausea And Vomiting  . Imipramine     Sweating, facial dysfunction   . Statins     MYALGIAS  . Tequin [Gatifloxacin] Other (See Comments)    Caused hypoglycemia Low blood sugar  . Brilinta [Ticagrelor] Rash    CAUSES PETECHIAE, PURPURA  . Pseudoephedrine Hcl Palpitations  . Septra [Sulfamethoxazole-Trimethoprim] Rash  . Sulfamethoxazole-Trimethoprim Rash    Family History  Problem Relation Age of Onset  . Heart disease Mother   . Heart disease Father   . Diabetes Brother   . Heart attack Mother   . Heart  attack Father   . Hypertension Neg Hx   . Stroke Paternal Grandmother       Prior to Admission medications   Medication Sig Start Date End Date Taking? Authorizing Provider  amLODipine (NORVASC) 2.5 MG tablet Take 1 tablet by mouth  daily 02/05/15  Yes Jettie Booze, MD  aspirin 81 MG chewable tablet Chew 1 tablet (81 mg total) by mouth daily. 12/20/14  Yes Almyra Deforest, PA  atorvastatin (LIPITOR) 10 MG tablet Take 10 mg by mouth daily.   Yes Historical Provider, MD  Calcium Glycerophosphate (PRELIEF PO) Take 1 tablet by mouth 2 (two) times daily with a meal.   Yes Historical Provider, MD  Cholecalciferol (VITAMIN D PO) Take 1 tablet by mouth daily.   Yes  Historical Provider, MD  Cinnamon 500 MG TABS Take 1 each by mouth daily.   Yes Historical Provider, MD  clopidogrel (PLAVIX) 75 MG tablet TAKE 1 TABLET ONCE DAILY. 09/17/15  Yes Jettie Booze, MD  Coenzyme Q10 10 MG capsule Take 10 mg by mouth daily.   Yes Historical Provider, MD  fluocinonide cream (LIDEX) [id number] % Apply 1 application topically 2 (two) times daily as needed (rash).    Yes Historical Provider, MD  fluticasone (FLONASE) 50 MCG/ACT nasal spray Place 2 sprays into both nostrils daily as needed for allergies.    Yes Historical Provider, MD  isosorbide mononitrate (IMDUR) 60 MG 24 hr tablet Take 2 tablets by mouth  once a day 01/10/15  Yes Jettie Booze, MD  levocetirizine (XYZAL) 5 MG tablet Take 5 mg by mouth every evening.   Yes Historical Provider, MD  levothyroxine (SYNTHROID, LEVOTHROID) 75 MCG tablet Take 75 mcg by mouth every evening.    Yes Historical Provider, MD  Melatonin 1 MG TABS Take 5 mg by mouth at bedtime as needed (SLEEP). Reported on 03/22/2015   Yes Historical Provider, MD  metoprolol succinate (TOPROL-XL) 100 MG 24 hr tablet Take 1 tablet by mouth once a day 08/26/15  Yes Jettie Booze, MD  nitroGLYCERIN (NITROSTAT) 0.4 MG SL tablet Place 1 tablet (0.4 mg total) under the tongue every 5 (five) minutes as needed for chest pain. 12/20/14  Yes Almyra Deforest, PA  Omega-3 Fatty Acids (FISH OIL PO) Take 1 tablet by mouth daily.    Yes Historical Provider, MD  predniSONE (DELTASONE) 1 MG tablet Take 10 mg by mouth daily with breakfast.    Yes Historical Provider, MD  Psyllium 400 MG CAPS Take 400 mg by mouth daily.   Yes Historical Provider, MD  RANEXA 1000 MG SR tablet Take 1 tablet by mouth  twice a day 08/20/15  Yes Jettie Booze, MD  venlafaxine XR (EFFEXOR-XR) 75 MG 24 hr capsule Take 225 mg by mouth daily with breakfast. Patient takes 3 capsules   Yes Historical Provider, MD    Physical Exam: Filed Vitals:   09/17/15 2030 09/17/15 2045 09/17/15 2220  09/17/15 2245  BP: 135/56 125/55 136/60 118/68  Pulse: 75 77 69 70  Temp:      TempSrc:      Resp: 18 16 18 16   SpO2: 98% 98% 98% 98%      Constitutional: NAD, calm, comfortable Eyes: PERRL, lids and conjunctivae normal ENMT: Mucous membranes are moist. Posterior pharynx clear of any exudate or lesions.Normal dentition.  Neck: normal, supple, no masses, no thyromegaly Respiratory: clear to auscultation bilaterally, no wheezing, no crackles. Normal respiratory effort. No accessory muscle use.  Cardiovascular: Regular rate and  rhythm, no murmurs / rubs / gallops. No extremity edema. 2+ pedal pulses. No carotid bruits.  Abdomen: no tenderness, no masses palpated. No hepatosplenomegaly. Bowel sounds positive.  Musculoskeletal: no clubbing / cyanosis. No joint deformity upper and lower extremities. Good ROM, no contractures. Normal muscle tone.  Skin: no rashes, lesions, ulcers. No induration Neurologic: CN 2-12 grossly intact. Sensation intact, DTR normal. Strength 5/5 in all 4.  Psychiatric: Normal judgment and insight. Alert and oriented x 3. Normal mood.    Labs on Admission: I have personally reviewed following labs and imaging studies  CBC:  Recent Labs Lab 09/17/15 2017  WBC 11.8*  HGB 10.7*  HCT 33.8*  MCV 90.9  PLT 0000000   Basic Metabolic Panel:  Recent Labs Lab 09/17/15 2017  NA 136  K 4.1  CL 104  CO2 25  GLUCOSE 144*  BUN 19  CREATININE 1.08*  CALCIUM 9.2   GFR: CrCl cannot be calculated (Unknown ideal weight.). Liver Function Tests: No results for input(s): AST, ALT, ALKPHOS, BILITOT, PROT, ALBUMIN in the last 168 hours. No results for input(s): LIPASE, AMYLASE in the last 168 hours. No results for input(s): AMMONIA in the last 168 hours. Coagulation Profile: No results for input(s): INR, PROTIME in the last 168 hours. Cardiac Enzymes: No results for input(s): CKTOTAL, CKMB, CKMBINDEX, TROPONINI in the last 168 hours. BNP (last 3 results) No results  for input(s): PROBNP in the last 8760 hours. HbA1C: No results for input(s): HGBA1C in the last 72 hours. CBG: No results for input(s): GLUCAP in the last 168 hours. Lipid Profile: No results for input(s): CHOL, HDL, LDLCALC, TRIG, CHOLHDL, LDLDIRECT in the last 72 hours. Thyroid Function Tests: No results for input(s): TSH, T4TOTAL, FREET4, T3FREE, THYROIDAB in the last 72 hours. Anemia Panel: No results for input(s): VITAMINB12, FOLATE, FERRITIN, TIBC, IRON, RETICCTPCT in the last 72 hours. Urine analysis: No results found for: COLORURINE, APPEARANCEUR, LABSPEC, PHURINE, GLUCOSEU, HGBUR, BILIRUBINUR, KETONESUR, PROTEINUR, UROBILINOGEN, NITRITE, LEUKOCYTESUR Sepsis Labs: @LABRCNTIP (procalcitonin:4,lacticidven:4) )No results found for this or any previous visit (from the past 240 hour(s)).   Radiological Exams on Admission: Dg Chest 2 View  09/17/2015  CLINICAL DATA:  75 year old female with chest pain EXAM: CHEST  2 VIEW COMPARISON:  Chest radiograph dated 12/31/2014 FINDINGS: Two views of the chest do not demonstrate focal consolidation. There is no pleural effusion or pneumothorax. Stable cardiac silhouette. Median sternotomy wires and CABG vascular clips. No acute osseous pathology. IMPRESSION: No active cardiopulmonary disease. Electronically Signed   By: Anner Crete M.D.   On: 09/17/2015 21:38    EKG: Independently reviewed.  Assessment/Plan Principal Problem:   Unstable angina (HCC)    UA -  Admit for chest pain obs  Serial trops  Tele monitor  Cards evaluating re: next step (stress vs cath)   DVT prophylaxis: Lovenox Code Status: Full Family Communication: No family in room Consults called: Cards Admission status: Observation   Etta Quill DO [facility] Pager [phone] from 7PM-7AM  If 7AM-7PM, please contact the day physician for the patient [email or web address] Password [facility]  09/18/2015, 12:21 AM

## 2015-09-18 NOTE — Progress Notes (Signed)
PROGRESS NOTE    Erin Ingram  [id number] DOB: 1940-10-17 DOA: 09/17/2015 PCP: [name], MD   Brief Narrative:  HPI On 09/18/2015 by Dr. Jennette Kettle Erin Ingram is a 75 y.o. female with medical history significant of CAD s/p CABG in 2000, multiple stents to bypass grafts in following years most recently 2 overlapping stents done in Sept of last year. Patient presents to the ED with c/o chest pain. Pain radiates to L arm and L jaw, 5/10 intensity, NTG makes it slightly better so she has taken 8 NTG tabs over the course of the past day. Activity causes worsening SOB. Assessment & Plan   Unstable angina -Patient has history of microvascular angina -Presented with shortness of breath and exertional pain -Troponins have been cycled and unremarkable, EKG shows no acute changes -Continue aspirin, Plavix, imudr, Ranexa, statin, metoprolol -Cardiology consulted and appreciated  Coronary artery disease -Status post CABG in 2000 -Treatment plan as above  Peripheral arterial disease -Cardiology reviewing cath films to evaluate for LIMA steel syndrome  Hyperlipidemia -Continue statin  Hypothyroidism -Continue Synthroid  Depression -Continue Effexor   DVT Prophylaxis  Lovenox  Code Status: Full  Family Communication: None at bedside  Disposition Plan: Admitted for observation, pending further cardiology recommendations  Consultants Cardiology   Procedures  None  Antibiotics   Anti-infectives    None      Subjective:   [name] seen and examined today.  Patient continues to have some chest pain. Denies shortness of breath, abdominal pain, dizziness, headache.   Objective:   Filed Vitals:   09/18/15 0330 09/18/15 0839 09/18/15 0956 09/18/15 1322  BP: 131/58  110/80 117/51  Pulse: 73  63 76  Temp: 98 F (36.7 C)  97.5 F (36.4 C) 97.8 F (36.6 C)  TempSrc: Oral   Oral  Resp: 18  18 18   Height:  4\' 10"  (1.473 m)    Weight:  97.977  kg (216 lb)    SpO2: 100%  99% 100%    Intake/Output Summary (Last 24 hours) at 09/18/15 1402 Last data filed at 09/18/15 1230  Gross per 24 hour  Intake    480 ml  Output    800 ml  Net   -320 ml   Filed Weights   09/18/15 0023 09/18/15 0839  Weight: 98.3 kg (216 lb 11.4 oz) 97.977 kg (216 lb)    Exam  General: Well developed, well nourished, NAD, appears stated age  22: NCAT, mucous membranes moist.   Cardiovascular: S1 S2 auscultated, no murmurs, RRR  Respiratory: Clear to auscultation bilaterally  Abdomen: Soft, nontender, nondistended, + bowel sounds  Extremities: warm dry without cyanosis clubbing or edema  Neuro: AAOx3, nonfocal  Psych: Normal affect and demeanor    Data Reviewed: I have personally reviewed following labs and imaging studies  CBC:  Recent Labs Lab 09/17/15 2017  WBC 11.8*  HGB 10.7*  HCT 33.8*  MCV 90.9  PLT 0000000   Basic Metabolic Panel:  Recent Labs Lab 09/17/15 2017  NA 136  K 4.1  CL 104  CO2 25  GLUCOSE 144*  BUN 19  CREATININE 1.08*  CALCIUM 9.2   GFR: Estimated Creatinine Clearance: 44.6 mL/min (by C-G formula based on Cr of 1.08). Liver Function Tests: No results for input(s): AST, ALT, ALKPHOS, BILITOT, PROT, ALBUMIN in the last 168 hours. No results for input(s): LIPASE, AMYLASE in the last 168 hours. No results for input(s): AMMONIA in the last 168 hours. Coagulation Profile:  No results for input(s): INR, PROTIME in the last 168 hours. Cardiac Enzymes:  Recent Labs Lab 09/18/15 0155 09/18/15 0400 09/18/15 0722  TROPONINI <0.03 <0.03 <0.03   BNP (last 3 results) No results for input(s): PROBNP in the last 8760 hours. HbA1C: No results for input(s): HGBA1C in the last 72 hours. CBG: No results for input(s): GLUCAP in the last 168 hours. Lipid Profile:  Recent Labs  09/18/15 0400  CHOL 163  HDL 66  LDLCALC 66  TRIG 155*  CHOLHDL 2.5   Thyroid Function Tests:  Recent Labs  09/18/15 0400   TSH 3.616   Anemia Panel: No results for input(s): VITAMINB12, FOLATE, FERRITIN, TIBC, IRON, RETICCTPCT in the last 72 hours. Urine analysis: No results found for: COLORURINE, APPEARANCEUR, LABSPEC, PHURINE, GLUCOSEU, HGBUR, BILIRUBINUR, KETONESUR, PROTEINUR, UROBILINOGEN, NITRITE, LEUKOCYTESUR Sepsis Labs: @LABRCNTIP (procalcitonin:4,lacticidven:4)  )No results found for this or any previous visit (from the past 240 hour(s)).    Radiology Studies: Dg Chest 2 View  09/17/2015  CLINICAL DATA:  75 year old female with chest pain EXAM: CHEST  2 VIEW COMPARISON:  Chest radiograph dated 12/31/2014 FINDINGS: Two views of the chest do not demonstrate focal consolidation. There is no pleural effusion or pneumothorax. Stable cardiac silhouette. Median sternotomy wires and CABG vascular clips. No acute osseous pathology. IMPRESSION: No active cardiopulmonary disease. Electronically Signed   By: Anner Crete M.D.   On: 09/17/2015 21:38     Scheduled Meds: . amLODipine  2.5 mg Oral Daily  . aspirin  81 mg Oral Daily  . atorvastatin  10 mg Oral Daily  . clopidogrel  75 mg Oral Daily  . enoxaparin (LOVENOX) injection  40 mg Subcutaneous QHS  . isosorbide mononitrate  120 mg Oral Daily  . levothyroxine  75 mcg Oral QPM  . loratadine  10 mg Oral QPM  . metoprolol succinate  100 mg Oral Daily  . predniSONE  10 mg Oral Q breakfast  . ranolazine  1,000 mg Oral BID  . venlafaxine XR  225 mg Oral Q breakfast   Continuous Infusions:      Time Spent in minutes   30 minutes  Gizzelle Lacomb D.O. on 09/18/2015 at 2:02 PM  Between 7am to 7pm - Pager - [phone]  After 7pm go to [email or web address] - password [email or web address]  And look for the night coverage person covering for me after hours  [facility] Office  [phone]

## 2015-09-18 NOTE — Care Management Obs Status (Signed)
[place] NOTIFICATION   Patient Details  Name: CHAUNTAE BULLEY MRN: [id number] Date of Birth: 07-Nov-1940   Medicare Observation Status Notification Given:  Yes    Dawayne Patricia, RN 09/18/2015, 12:42 PM

## 2015-09-18 NOTE — Progress Notes (Signed)
Patient Name: Erin Ingram Date of Encounter: 09/18/2015  Cardiologist: Dr. Casandra Doffing   Pt. Profile: Erin Ingram is a 75 y.o. female with a hx of CAD status post CABG in 2000, HTN, HL, sleep apnea, PMR, L subclavian stenosis. . She's had a history of persistent angina treated with EECP many years ago.    Admitted 9/28-9/29 with worsening angina. LHC demonstrated 90% distal SVG-RCA stenosis. This was treated with 2 overlapping Synergy DES. SVG-OM2 was patent. There is a Y graft with a LIMA-LAD and SVG-diagonal. LIMA portion was patent but SVG-diagonal was occluded.  Readmitted 10/10-10/11/16 with back and right arm pain similar to previous angina. She had resolution with nitroglycerin. Cardiac enzymes remained normal. Relook cardiac catheterization demonstrated patent stents in the vein graft to the RCA and otherwise stable anatomy. Continued medical therapy was recommended.  SUBJECTIVE  Exertional chest pain and SOB. Asymptomatic at rest.   CURRENT MEDS . amLODipine  2.5 mg Oral Daily  . aspirin  81 mg Oral Daily  . atorvastatin  10 mg Oral Daily  . clopidogrel  75 mg Oral Daily  . enoxaparin (LOVENOX) injection  40 mg Subcutaneous QHS  . isosorbide mononitrate  120 mg Oral Daily  . levothyroxine  75 mcg Oral QPM  . loratadine  10 mg Oral QPM  . metoprolol succinate  100 mg Oral Daily  . predniSONE  10 mg Oral Q breakfast  . ranolazine  1,000 mg Oral BID  . venlafaxine XR  225 mg Oral Q breakfast    OBJECTIVE  Filed Vitals:   09/18/15 0023 09/18/15 0330 09/18/15 0839 09/18/15 0956  BP: 142/62 131/58  110/80  Pulse: 81 73  63  Temp: 97.9 F (36.6 C) 98 F (36.7 C)  97.5 F (36.4 C)  TempSrc: Oral Oral    Resp: 18 18  18   Height:   4\' 10"  (1.473 m)   Weight: 216 lb 11.4 oz (98.3 kg)  216 lb (97.977 kg)   SpO2: 98% 100%  99%    Intake/Output Summary (Last 24 hours) at 09/18/15 1212 Last data filed at 09/18/15 0900  Gross per 24 hour  Intake    240 ml   Output    600 ml  Net   -360 ml   Filed Weights   09/18/15 0023 09/18/15 0839  Weight: 216 lb 11.4 oz (98.3 kg) 216 lb (97.977 kg)    PHYSICAL EXAM  General: Pleasant, NAD. Neuro: Alert and oriented X 3. Moves all extremities spontaneously. Psych: Normal affect. HEENT:  Normal  Neck: Supple without bruits or JVD. Lungs:  Resp regular and unlabored, CTA. Heart: RRR no s3, s4, or murmurs. Abdomen: Soft, non-tender, non-distended, BS + x 4.  Extremities: No clubbing, cyanosis or edema. DP/PT/Radials 2+ and equal bilaterally.  Accessory Clinical Findings  CBC  Recent Labs  09/17/15 2017  WBC 11.8*  HGB 10.7*  HCT 33.8*  MCV 90.9  PLT 0000000   Basic Metabolic Panel  Recent Labs  09/17/15 2017  NA 136  K 4.1  CL 104  CO2 25  GLUCOSE 144*  BUN 19  CREATININE 1.08*  CALCIUM 9.2   Liver Function Tests No results for input(s): AST, ALT, ALKPHOS, BILITOT, PROT, ALBUMIN in the last 72 hours. No results for input(s): LIPASE, AMYLASE in the last 72 hours. Cardiac Enzymes  Recent Labs  09/18/15 0155 09/18/15 0400 09/18/15 0722  TROPONINI <0.03 <0.03 <0.03   BNP Invalid input(s): POCBNP D-Dimer No results for input(s):  DDIMER in the last 72 hours. Hemoglobin A1C No results for input(s): HGBA1C in the last 72 hours. Fasting Lipid Panel  Recent Labs  09/18/15 0400  CHOL 163  HDL 66  LDLCALC 66  TRIG 155*  CHOLHDL 2.5   Thyroid Function Tests  Recent Labs  09/18/15 0400  TSH 3.616    TELE  Sinus rhythm  Radiology/Studies  Dg Chest 2 View  09/17/2015  CLINICAL DATA:  75 year old female with chest pain EXAM: CHEST  2 VIEW COMPARISON:  Chest radiograph dated 12/31/2014 FINDINGS: Two views of the chest do not demonstrate focal consolidation. There is no pleural effusion or pneumothorax. Stable cardiac silhouette. Median sternotomy wires and CABG vascular clips. No acute osseous pathology. IMPRESSION: No active cardiopulmonary disease. Electronically  Signed   By: Anner Crete M.D.   On: 09/17/2015 21:38    ASSESSMENT AND PLAN  1. Unstable angina (HCC) - hx of microvascular angina. Continues to have exertional angina and SOB. She describes her anginal pain as pain in her arm. No chest pain. She is ruled out. Troponin x 3 negative. EKG without acute changes.  - Continue ASA, plavix, renexa, imdur, statin, BB and amlodipine. Consider increasing amlodipine vs cardiac rehab. Will discuss with MD. She has eaten today.   2. CAD   - CABG '00 (SVG/ free LIMA Y graft to the diagonal, and distal LAD, SVG to the OM 1, and SVG to PDA) s/p EECP  - 9/16 presented with unstable angina s/p  DES x 2 to 90% SVG-RCA. SVG to D1 was occluded.  - On 01/01/2015, she again presented with similar sx and relook angio did not show any new changes. recommended medical therapy.   3. PAD (left subclavian stenosis).  - Her anginal pain is L arm pain. ? looking at cath films to evaluate for LIMA steel syndrome  4. HLD - 09/18/2015: Cholesterol 163; HDL 66; LDL Cholesterol 66; Triglycerides 155*; VLDL 31  - Continue statin. Consider adding Lovazza.   Jarrett Soho PA-C Pager [phone]   Patient seen and examined. Agree with assessment and plan. Pt has a h/o CABG and underwent DES stent to SVG-RCA and has known occlusion of SVG to Dx1. A subsequent cath several months later was not significantly different. She has been fairly stable with mild angina. She had an increased episode leading to present admission.  Currently pain free and appears comfortable.  She had derived significant benefit with Ranexa and also admits to noticeable benefit when amlodipine was added. Will increase amlodipine to 5 mg and also add nocturnal imdur at 30 mg to her 120 mg am regimen. Would defer repeat cath presently unless recurrent increasing symptoms.   Troy Sine, MD, [facility] 09/18/2015 3:54 PM

## 2015-09-18 NOTE — Telephone Encounter (Signed)
Called [phone] and left voice mail.  Patient is currently hospitalized. Routing to Dr. Irish Lack and his primary nurse for their awareness.

## 2015-09-18 NOTE — Progress Notes (Signed)
Pt c/o chest pain. 5/10 pain. Pt in no apparent distress. VSS. EKG unremarkable. Cardiology  paged.  Geralynn Ochs, MD gave verbal order for 0.5 mg IV Dilaudid, check next troponin, and to call if EKG showed ST elevation. Will continue to monitor pt. Pt resting in bed in lowest position, call bell within reach.

## 2015-09-19 DIAGNOSIS — E039 Hypothyroidism, unspecified: Secondary | ICD-10-CM | POA: Diagnosis not present

## 2015-09-19 DIAGNOSIS — F329 Major depressive disorder, single episode, unspecified: Secondary | ICD-10-CM | POA: Diagnosis not present

## 2015-09-19 DIAGNOSIS — I25700 Atherosclerosis of coronary artery bypass graft(s), unspecified, with unstable angina pectoris: Secondary | ICD-10-CM | POA: Diagnosis not present

## 2015-09-19 DIAGNOSIS — I200 Unstable angina: Secondary | ICD-10-CM | POA: Diagnosis not present

## 2015-09-19 DIAGNOSIS — E785 Hyperlipidemia, unspecified: Secondary | ICD-10-CM | POA: Diagnosis not present

## 2015-09-19 LAB — BASIC METABOLIC PANEL
Anion gap: 6 (ref 5–15)
BUN: 19 mg/dL (ref 6–20)
CO2: 26 mmol/L (ref 22–32)
Calcium: 8.6 mg/dL — ABNORMAL LOW (ref 8.9–10.3)
Chloride: 104 mmol/L (ref 101–111)
Creatinine, Ser: 1.27 mg/dL — ABNORMAL HIGH (ref 0.44–1.00)
GFR calc Af Amer: 46 mL/min — ABNORMAL LOW (ref >60)
GFR calc non Af Amer: 40 mL/min — ABNORMAL LOW (ref >60)
Glucose, Bld: 214 mg/dL — ABNORMAL HIGH (ref 65–99)
Potassium: 4.6 mmol/L (ref 3.5–5.1)
Sodium: 136 mmol/L (ref 135–145)

## 2015-09-19 LAB — CBC
HCT: 33.8 % — ABNORMAL LOW (ref 36.0–46.0)
Hemoglobin: 10.7 g/dL — ABNORMAL LOW (ref 12.0–15.0)
MCH: 29.6 pg (ref 26.0–34.0)
MCHC: 31.7 g/dL (ref 30.0–36.0)
MCV: 93.6 fL (ref 78.0–100.0)
Platelets: 242 10*3/uL (ref 150–400)
RBC: 3.61 MIL/uL — ABNORMAL LOW (ref 3.87–5.11)
RDW: 14.5 % (ref 11.5–15.5)
WBC: 12.6 10*3/uL — ABNORMAL HIGH (ref 4.0–10.5)

## 2015-09-19 LAB — HEMOGLOBIN A1C
Hgb A1c MFr Bld: 5.6 % (ref 4.8–5.6)
Mean Plasma Glucose: 114 mg/dL

## 2015-09-19 LAB — PROTIME-INR
INR: 1.06 (ref 0.00–1.49)
Prothrombin Time: 14 seconds (ref 11.6–15.2)

## 2015-09-19 MED ORDER — SODIUM CHLORIDE 0.9% FLUSH
3.0000 mL | Freq: Two times a day (BID) | INTRAVENOUS | Status: DC
Start: 2015-09-19 — End: 2015-09-20
  Administered 2015-09-19: 3 mL via INTRAVENOUS

## 2015-09-19 MED ORDER — SODIUM CHLORIDE 0.9% FLUSH: 3.0000 mL | INTRAVENOUS | Status: DC | PRN

## 2015-09-19 MED ORDER — SODIUM CHLORIDE 0.9 % IV SOLN
INTRAVENOUS | Status: DC
  Administered 2015-09-20: 06:00:00 via INTRAVENOUS

## 2015-09-19 MED ORDER — SODIUM CHLORIDE 0.9 % IV SOLN: 250.0000 mL | INTRAVENOUS | Status: DC | PRN

## 2015-09-19 MED ORDER — ASPIRIN 81 MG PO CHEW
81.0000 mg | CHEWABLE_TABLET | ORAL | Status: AC
  Administered 2015-09-20: 81 mg via ORAL
  Filled 2015-09-19: qty 1

## 2015-09-19 NOTE — Progress Notes (Signed)
Patient lying in bed, no needs at this time. Call light within reach. 

## 2015-09-19 NOTE — Progress Notes (Signed)
PROGRESS NOTE    Erin Ingram  [id number] DOB: Oct 22, 1940 DOA: 09/17/2015 PCP: Erin Pac, MD   Brief Narrative:  HPI On 09/18/2015 by Dr. Jennette Kettle Erin Ingram is a 75 y.o. female with medical history significant of CAD s/p CABG in 2000, multiple stents to bypass grafts in following years most recently 2 overlapping stents done in Sept of last year. Patient presents to the ED with c/o chest pain. Pain radiates to L arm and L jaw, 5/10 intensity, NTG makes it slightly better so she has taken 8 NTG tabs over the course of the past day. Activity causes worsening SOB. Assessment & Plan   Unstable angina -Patient has history of microvascular angina -Presented with shortness of breath and exertional pain -Troponins have been cycled and unremarkable, EKG shows no acute changes -Continue aspirin, Plavix, imudr, Ranexa, statin, metoprolol -Cardiology consulted and appreciated, increased amlodipine to 5mg  daily and imdur to 120mg  QHS.  -Patient continues to have pain.  -Plan for heart cath 6/30  Coronary artery disease -Status post CABG in 2000 -Treatment plan as above  Peripheral arterial disease -Cardiology reviewing cath films to evaluate for LIMA steel syndrome  Hyperlipidemia -Continue statin  Hypothyroidism -Continue Synthroid  Depression -Continue Effexor   DVT Prophylaxis  Lovenox  Code Status: Full  Family Communication: None at bedside  Disposition Plan: Admitted for observation, pending heart cath 6/30  Consultants Cardiology   Procedures  None  Antibiotics   Anti-infectives    None      Subjective:   Erin Ingram seen and examined today.  Patient continues to have some chest pain, but feels better compared to previous day. Denies shortness of breath, abdominal pain, dizziness, headache.   Objective:   Filed Vitals:   09/18/15 1322 09/18/15 2028 09/19/15 0602 09/19/15 0942  BP: 117/51 95/70 116/67 115/41  Pulse: 76 64 71  64  Temp: 97.8 F (36.6 C) 97.5 F (36.4 C) 97.8 F (36.6 C)   TempSrc: Oral Oral Oral   Resp: 18 19    Height:      Weight:      SpO2: 100% 100% 97%     Intake/Output Summary (Last 24 hours) at 09/19/15 1625 Last data filed at 09/18/15 1645  Gross per 24 hour  Intake    240 ml  Output      0 ml  Net    240 ml   Filed Weights   09/18/15 0023 09/18/15 0839  Weight: 98.3 kg (216 lb 11.4 oz) 97.977 kg (216 lb)    Exam  General: Well developed, well nourished, NAD, appears stated age  73: NCAT, mucous membranes moist.   Cardiovascular: S1 S2 auscultated, no murmurs, RRR  Respiratory: Clear to auscultation bilaterally  Abdomen: Soft, nontender, nondistended, + bowel sounds  Extremities: warm dry without cyanosis clubbing or edema  Neuro: AAOx3, nonfocal  Psych: Normal affect and demeanor, pleasant   Data Reviewed: I have personally reviewed following labs and imaging studies  CBC:  Recent Labs Lab 09/17/15 2017  WBC 11.8*  HGB 10.7*  HCT 33.8*  MCV 90.9  PLT 0000000   Basic Metabolic Panel:  Recent Labs Lab 09/17/15 2017  NA 136  K 4.1  CL 104  CO2 25  GLUCOSE 144*  BUN 19  CREATININE 1.08*  CALCIUM 9.2   GFR: Estimated Creatinine Clearance: 44.6 mL/min (by C-G formula based on Cr of 1.08). Liver Function Tests: No results for input(s): AST, ALT, ALKPHOS, BILITOT, PROT, ALBUMIN in the  last 168 hours. No results for input(s): LIPASE, AMYLASE in the last 168 hours. No results for input(s): AMMONIA in the last 168 hours. Coagulation Profile: No results for input(s): INR, PROTIME in the last 168 hours. Cardiac Enzymes:  Recent Labs Lab 09/18/15 0155 09/18/15 0400 09/18/15 0722  TROPONINI <0.03 <0.03 <0.03   BNP (last 3 results) No results for input(s): PROBNP in the last 8760 hours. HbA1C:  Recent Labs  09/18/15 0400  HGBA1C 5.6   CBG: No results for input(s): GLUCAP in the last 168 hours. Lipid Profile:  Recent Labs   09/18/15 0400  CHOL 163  HDL 66  LDLCALC 66  TRIG 155*  CHOLHDL 2.5   Thyroid Function Tests:  Recent Labs  09/18/15 0400  TSH 3.616   Anemia Panel: No results for input(s): VITAMINB12, FOLATE, FERRITIN, TIBC, IRON, RETICCTPCT in the last 72 hours. Urine analysis: No results found for: COLORURINE, APPEARANCEUR, LABSPEC, PHURINE, GLUCOSEU, HGBUR, BILIRUBINUR, KETONESUR, PROTEINUR, UROBILINOGEN, NITRITE, LEUKOCYTESUR Sepsis Labs: @LABRCNTIP (procalcitonin:4,lacticidven:4)  )No results found for this or any previous visit (from the past 240 hour(s)).    Radiology Studies: Dg Chest 2 View  09/17/2015  CLINICAL DATA:  75 year old female with chest pain EXAM: CHEST  2 VIEW COMPARISON:  Chest radiograph dated 12/31/2014 FINDINGS: Two views of the chest do not demonstrate focal consolidation. There is no pleural effusion or pneumothorax. Stable cardiac silhouette. Median sternotomy wires and CABG vascular clips. No acute osseous pathology. IMPRESSION: No active cardiopulmonary disease. Electronically Signed   By: Anner Crete M.D.   On: 09/17/2015 21:38     Scheduled Meds: . amLODipine  5 mg Oral Daily  . aspirin  81 mg Oral Daily  . atorvastatin  10 mg Oral Daily  . clopidogrel  75 mg Oral Daily  . enoxaparin (LOVENOX) injection  40 mg Subcutaneous QHS  . isosorbide mononitrate  120 mg Oral Daily  . isosorbide mononitrate  30 mg Oral QHS  . levothyroxine  75 mcg Oral QPM  . loratadine  10 mg Oral QPM  . metoprolol succinate  100 mg Oral Daily  . predniSONE  10 mg Oral Q breakfast  . ranolazine  1,000 mg Oral BID  . venlafaxine XR  225 mg Oral Q breakfast   Continuous Infusions:      Time Spent in minutes   30 minutes  Paxtyn Wisdom D.O. on 09/19/2015 at 4:25 PM  Between 7am to 7pm - Pager - [phone]  After 7pm go to [email or web address] - password [email or web address]  And look for the night coverage person covering for me after hours  [facility] Office   [phone]

## 2015-09-19 NOTE — Progress Notes (Signed)
 Subjective:  Pt has had recurrent chest pain with minimal activity; despite increased med.  Objective:   Vital Signs : Filed Vitals:   09/18/15 1322 09/18/15 2028 09/19/15 0602 09/19/15 0942  BP: 117/51 95/70 116/67 115/41  Pulse: 76 64 71 64  Temp: 97.8 F (36.6 C) 97.5 F (36.4 C) 97.8 F (36.6 C)   TempSrc: Oral Oral Oral   Resp: 18 19    Height:      Weight:      SpO2: 100% 100% 97%     Intake/Output from previous day:  Intake/Output Summary (Last 24 hours) at 09/19/15 1550 Last data filed at 09/18/15 1645  Gross per 24 hour  Intake    240 ml  Output      0 ml  Net    240 ml    I/O since admission: -80  Wt Readings from Last 3 Encounters:  09/18/15 216 lb (97.977 kg)  04/11/15 215 lb (97.523 kg)  02/21/15 216 lb 7.9 oz (98.2 kg)    Medications: . amLODipine  5 mg Oral Daily  . aspirin  81 mg Oral Daily  . atorvastatin  10 mg Oral Daily  . clopidogrel  75 mg Oral Daily  . enoxaparin (LOVENOX) injection  40 mg Subcutaneous QHS  . isosorbide mononitrate  120 mg Oral Daily  . isosorbide mononitrate  30 mg Oral QHS  . levothyroxine  75 mcg Oral QPM  . loratadine  10 mg Oral QPM  . metoprolol succinate  100 mg Oral Daily  . predniSONE  10 mg Oral Q breakfast  . ranolazine  1,000 mg Oral BID  . venlafaxine XR  225 mg Oral Q breakfast       Physical Exam:   General appearance: appears cushingnoid Neck: no adenopathy, no carotid bruit, no JVD, supple, symmetrical, trachea midline and thyroid not enlarged, symmetric, no tenderness/mass/nodules Lungs: clear to auscultation bilaterally Heart: regular rate and rhythm and 1/6 systolic murmur; no s3 Abdomen: obese; BS + Extremities: no edema, redness or tenderness in the calves or thighs Pulses: 2+ and symmetric Neurologic: Grossly normal   Rate: 68  Rhythm: normal sinus rhythm  ECG (independently read by me):  Lab Results:   Recent Labs  09/17/15 2017  NA 136  K 4.1  CL 104  CO2 25  GLUCOSE  144*  BUN 19  CREATININE 1.08*  CALCIUM 9.2    Hepatic Function Latest Ref Rng 01/01/2015 08/06/2009  Total Protein 6.5 - 8.1 g/dL 6.4(L) 7.4  Albumin 3.5 - 5.0 g/dL 3.3(L) 4.2  AST 15 - 41 U/L 18 17  ALT 14 - 54 U/L 13(L) 14  Alk Phosphatase 38 - 126 U/L 60 75  Total Bilirubin 0.3 - 1.2 mg/dL 0.4 0.4     Recent Labs  09/17/15 2017  WBC 11.8*  HGB 10.7*  HCT 33.8*  MCV 90.9  PLT 258     Recent Labs  09/18/15 0155 09/18/15 0400 09/18/15 0722  TROPONINI <0.03 <0.03 <0.03    Lab Results  Component Value Date   TSH 3.616 09/18/2015    Recent Labs  09/18/15 0400  HGBA1C 5.6    No results for input(s): PROT, ALBUMIN, AST, ALT, ALKPHOS, BILITOT, BILIDIR, IBILI in the last 72 hours. No results for input(s): INR in the last 72 hours. BNP (last 3 results) No results for input(s): BNP in the last 8760 hours.  ProBNP (last 3 results) No results for input(s): PROBNP in the last 8760 hours.     Lipid Panel     Component Value Date/Time   CHOL 163 09/18/2015 0400   TRIG 155* 09/18/2015 0400   HDL 66 09/18/2015 0400   CHOLHDL 2.5 09/18/2015 0400   VLDL 31 09/18/2015 0400   LDLCALC 66 09/18/2015 0400    Imaging:  Dg Chest 2 View  09/17/2015  CLINICAL DATA:  75-year-old female with chest pain EXAM: CHEST  2 VIEW COMPARISON:  Chest radiograph dated 12/31/2014 FINDINGS: Two views of the chest do not demonstrate focal consolidation. There is no pleural effusion or pneumothorax. Stable cardiac silhouette. Median sternotomy wires and CABG vascular clips. No acute osseous pathology. IMPRESSION: No active cardiopulmonary disease. Electronically Signed   By: Arash  Radparvar M.D.   On: 09/17/2015 21:38      Assessment/Plan:   Principal Problem:   Unstable angina (HCC) Active Problems:   Coronary artery disease involving coronary bypass graft of native heart   Hyperlipidemia LDL goal <70   1. Unstable Angina; Medical therapy was increased yesterday. Still with  recurrent CP with minimal activity such as washing or walking to the bathroom;  This was not present several weeks ago.  Currntly on ASA, plavix, ranexa 1000 bid, imdur  120 am, 30 hs, statin, metoprolol succinate 100 mg, and amlodipine now at 5 mg.  2. CAD: pt is s/p CABG in 2000  free LIMA Y graft to the diagonal, and distal LAD, SVG to the OM 1, and SVG to PDA.  9/16 presented with unstable angina s/p DES x 2 to 90% SVG-RCA. SVG to D1 was occluded.  - On 01/01/2015, she again presented with similar sx and relook angio did not show any new changes. recommended medical therapy.   3. Left subclavian stenosis.  4. Hyperlipidemia: onl;y on low dose atorva at 10 mg; will increase to 40 mg.   With recurrent symptomology despite increased amlodipine and nitrates yesterday recommend definitive cardiac cath. The risks and benefits of a cardiac catheterization including, but not limited to, death, stroke, MI, kidney damage and bleeding were discussed with the patient who indicates understanding and agrees to proceed.  Will set up with Dr. Varanasi tomorrow who follows the pt in the office.  Thomas A. Kelly, MD, FACC 09/19/2015, 3:50 PM 

## 2015-09-20 ENCOUNTER — Encounter (HOSPITAL_COMMUNITY): Payer: Self-pay | Admitting: Interventional Cardiology

## 2015-09-20 ENCOUNTER — Encounter (HOSPITAL_COMMUNITY)
Admission: EM | Disposition: A | Discharge status: Home / Self Care | Payer: Self-pay | Source: Home / Self Care | Attending: Emergency Medicine

## 2015-09-20 DIAGNOSIS — I25700 Atherosclerosis of coronary artery bypass graft(s), unspecified, with unstable angina pectoris: Secondary | ICD-10-CM | POA: Diagnosis not present

## 2015-09-20 DIAGNOSIS — E8779 Other fluid overload: Secondary | ICD-10-CM | POA: Diagnosis not present

## 2015-09-20 DIAGNOSIS — E785 Hyperlipidemia, unspecified: Secondary | ICD-10-CM | POA: Diagnosis not present

## 2015-09-20 DIAGNOSIS — I200 Unstable angina: Secondary | ICD-10-CM | POA: Diagnosis not present

## 2015-09-20 HISTORY — PX: CARDIAC CATHETERIZATION: SHX172

## 2015-09-20 LAB — BASIC METABOLIC PANEL
Anion gap: 6 (ref 5–15)
BUN: 18 mg/dL (ref 6–20)
CO2: 28 mmol/L (ref 22–32)
Calcium: 8.4 mg/dL — ABNORMAL LOW (ref 8.9–10.3)
Chloride: 105 mmol/L (ref 101–111)
Creatinine, Ser: 1 mg/dL (ref 0.44–1.00)
GFR calc Af Amer: >60 mL/min (ref >60)
GFR calc non Af Amer: 53 mL/min — ABNORMAL LOW (ref >60)
Glucose, Bld: 103 mg/dL — ABNORMAL HIGH (ref 65–99)
Potassium: 4.3 mmol/L (ref 3.5–5.1)
Sodium: 139 mmol/L (ref 135–145)

## 2015-09-20 LAB — CBC
HCT: 34.7 % — ABNORMAL LOW (ref 36.0–46.0)
Hemoglobin: 10.8 g/dL — ABNORMAL LOW (ref 12.0–15.0)
MCH: 29.5 pg (ref 26.0–34.0)
MCHC: 31.1 g/dL (ref 30.0–36.0)
MCV: 94.8 fL (ref 78.0–100.0)
Platelets: 248 10*3/uL (ref 150–400)
RBC: 3.66 MIL/uL — ABNORMAL LOW (ref 3.87–5.11)
RDW: 14.5 % (ref 11.5–15.5)
WBC: 13.6 10*3/uL — ABNORMAL HIGH (ref 4.0–10.5)

## 2015-09-20 SURGERY — LEFT HEART CATH AND CORS/GRAFTS ANGIOGRAPHY
Anesthesia: LOCAL

## 2015-09-20 MED ORDER — FENTANYL CITRATE (PF) 100 MCG/2ML IJ SOLN
INTRAMUSCULAR | Status: AC
  Filled 2015-09-20: qty 2

## 2015-09-20 MED ORDER — HEPARIN (PORCINE) IN NACL 2-0.9 UNIT/ML-% IJ SOLN
INTRAMUSCULAR | Status: DC | PRN
  Administered 2015-09-20: 1000 mL

## 2015-09-20 MED ORDER — HEPARIN SODIUM (PORCINE) 5000 UNIT/ML IJ SOLN: 5000.0000 [IU] | Freq: Three times a day (TID) | INTRAMUSCULAR | Status: DC

## 2015-09-20 MED ORDER — PSYLLIUM 95 % PO PACK
1.0000 | PACK | Freq: Every day | ORAL | Status: DC
  Administered 2015-09-20 – 2015-09-21 (×2): 1 via ORAL
  Filled 2015-09-20 (×2): qty 1

## 2015-09-20 MED ORDER — LIDOCAINE HCL (PF) 1 % IJ SOLN
INTRAMUSCULAR | Status: DC | PRN
  Administered 2015-09-20: 20 mL

## 2015-09-20 MED ORDER — MIDAZOLAM HCL 2 MG/2ML IJ SOLN
INTRAMUSCULAR | Status: AC
Start: 2015-09-20 — End: 2015-09-20
  Filled 2015-09-20: qty 2

## 2015-09-20 MED ORDER — SODIUM CHLORIDE 0.9% FLUSH: 3.0000 mL | INTRAVENOUS | Status: DC | PRN

## 2015-09-20 MED ORDER — SODIUM CHLORIDE 0.9 % IV SOLN: 250.0000 mL | INTRAVENOUS | Status: DC | PRN

## 2015-09-20 MED ORDER — FUROSEMIDE 10 MG/ML IJ SOLN
40.0000 mg | Freq: Once | INTRAMUSCULAR | Status: AC
  Administered 2015-09-20: 40 mg via INTRAVENOUS
  Filled 2015-09-20: qty 4

## 2015-09-20 MED ORDER — IOPAMIDOL (ISOVUE-370) INJECTION 76%
INTRAVENOUS | Status: AC
  Filled 2015-09-20: qty 125

## 2015-09-20 MED ORDER — LIDOCAINE HCL (PF) 1 % IJ SOLN
INTRAMUSCULAR | Status: AC
Start: 2015-09-20 — End: 2015-09-20
  Filled 2015-09-20: qty 30

## 2015-09-20 MED ORDER — ONDANSETRON HCL 4 MG/2ML IJ SOLN: 4.0000 mg | Freq: Four times a day (QID) | INTRAMUSCULAR | Status: DC | PRN

## 2015-09-20 MED ORDER — SODIUM CHLORIDE 0.9% FLUSH
3.0000 mL | Freq: Two times a day (BID) | INTRAVENOUS | Status: DC
  Administered 2015-09-20 – 2015-09-21 (×2): 3 mL via INTRAVENOUS

## 2015-09-20 MED ORDER — MIDAZOLAM HCL 2 MG/2ML IJ SOLN
INTRAMUSCULAR | Status: DC | PRN
  Administered 2015-09-20: 2 mg via INTRAVENOUS

## 2015-09-20 MED ORDER — FLEET ENEMA 7-19 GM/118ML RE ENEM
1.0000 | ENEMA | Freq: Once | RECTAL | Status: AC
  Administered 2015-09-20: 1 via RECTAL
  Filled 2015-09-20: qty 1

## 2015-09-20 MED ORDER — HEPARIN (PORCINE) IN NACL 2-0.9 UNIT/ML-% IJ SOLN
INTRAMUSCULAR | Status: AC
  Filled 2015-09-20: qty 1000

## 2015-09-20 MED ORDER — SODIUM CHLORIDE 0.9% FLUSH
3.0000 mL | Freq: Two times a day (BID) | INTRAVENOUS | Status: DC
  Administered 2015-09-20 – 2015-09-21 (×3): 3 mL via INTRAVENOUS

## 2015-09-20 MED ORDER — ACETAMINOPHEN 325 MG PO TABS: 650.0000 mg | ORAL_TABLET | ORAL | Status: DC | PRN

## 2015-09-20 MED ORDER — IOPAMIDOL (ISOVUE-370) INJECTION 76%
INTRAVENOUS | Status: DC | PRN
  Administered 2015-09-20: 55 mL via INTRA_ARTERIAL

## 2015-09-20 SURGICAL SUPPLY — 7 items
CATH INFINITI 5FR MULTPACK ANG (CATHETERS) ×2 IMPLANT
KIT HEART LEFT (KITS) ×2 IMPLANT
PACK CARDIAC CATHETERIZATION (CUSTOM PROCEDURE TRAY) ×2 IMPLANT
SHEATH PINNACLE 5F 10CM (SHEATH) ×2 IMPLANT
SYR MEDRAD MARK V 150ML (SYRINGE) ×2 IMPLANT
TRANSDUCER W/STOPCOCK (MISCELLANEOUS) ×2 IMPLANT
WIRE EMERALD 3MM-J .035X150CM (WIRE) ×2 IMPLANT

## 2015-09-20 NOTE — Progress Notes (Signed)
PROGRESS NOTE    Erin Ingram  [id number] DOB: 10-04-1940 DOA: 09/17/2015 PCP: [name], MD   Brief Narrative:  HPI On 09/18/2015 by Dr. Jennette Kettle Erin Ingram is a 75 y.o. female with medical history significant of CAD s/p CABG in 2000, multiple stents to bypass grafts in following years most recently 2 overlapping stents done in Sept of last year. Patient presents to the ED with c/o chest pain. Pain radiates to L arm and L jaw, 5/10 intensity, NTG makes it slightly better so she has taken 8 NTG tabs over the course of the past day. Activity causes worsening SOB. Assessment & Plan   Unstable angina -Patient has history of microvascular angina -Presented with shortness of breath and exertional pain -Troponins have been cycled and unremarkable, EKG shows no acute changes -Continue aspirin, Plavix, imudr, Ranexa, statin, metoprolol -Cardiology consulted and appreciated, increased amlodipine to 5mg  daily and imdur to 120mg  QHS.  -Patient continues to have pain.  -Heart catheterization: severe 3 vessel CAD, patent SVG to OM and PDA. Elevated LVEDP. Diurese with lasix.   Coronary artery disease -Status post CABG in 2000 -Treatment plan as above  Peripheral arterial disease -Cardiology reviewing cath films to evaluate for LIMA steel syndrome  Hyperlipidemia -Continue statin  Hypothyroidism -Continue Synthroid  Depression -Continue Effexor   DVT Prophylaxis  Lovenox  Code Status: Full  Family Communication: None at bedside  Disposition Plan: Admitted for observation, pending further recommendations from cardiology.  Consultants Cardiology   Procedures  Heart catheterization  Antibiotics   Anti-infectives    None      Subjective:   [name] seen and examined today.  Patient continues to have some chest pain, but feels it has improved. Continues to have chest pain with movement/exertion.  Denies shortness of breath, abdominal pain,  dizziness, headache.   Objective:   Filed Vitals:   09/20/15 1247 09/20/15 1302 09/20/15 1317 09/20/15 1336  BP: 116/48 116/52 114/56 152/59  Pulse: 62 62 60 64  Temp:      TempSrc:      Resp:      Height:      Weight:      SpO2: 96% 96% 97% 99%   No intake or output data in the 24 hours ending 09/20/15 1342 Filed Weights   09/18/15 0023 09/18/15 0839 09/20/15 0652  Weight: 98.3 kg (216 lb 11.4 oz) 97.977 kg (216 lb) 96.843 kg (213 lb 8 oz)    Exam  General: Well developed, well nourished, No distress  HEENT: NCAT, mucous membranes moist.   Cardiovascular: S1 S2 auscultated, no murmurs, RRR  Respiratory: Clear to auscultation bilaterally  Abdomen: Soft, nontender, nondistended, + bowel sounds  Extremities: warm dry without cyanosis clubbing or edema  Neuro: AAOx3, nonfocal  Psych: Appropriate mood and affect, pleasant    Data Reviewed: I have personally reviewed following labs and imaging studies  CBC:  Recent Labs Lab 09/17/15 2017 09/19/15 1836 09/20/15 0303  WBC 11.8* 12.6* 13.6*  HGB 10.7* 10.7* 10.8*  HCT 33.8* 33.8* 34.7*  MCV 90.9 93.6 94.8  PLT 258 242 [id number]   Basic Metabolic Panel:  Recent Labs Lab 09/17/15 2017 09/19/15 1836 09/20/15 0303  NA 136 136 139  K 4.1 4.6 4.3  CL 104 104 105  CO2 25 26 28   GLUCOSE 144* 214* 103*  BUN 19 19 18   CREATININE 1.08* 1.27* 1.00  CALCIUM 9.2 8.6* 8.4*   GFR: Estimated Creatinine Clearance: 47.8 mL/min (by C-G formula  based on Cr of 1). Liver Function Tests: No results for input(s): AST, ALT, ALKPHOS, BILITOT, PROT, ALBUMIN in the last 168 hours. No results for input(s): LIPASE, AMYLASE in the last 168 hours. No results for input(s): AMMONIA in the last 168 hours. Coagulation Profile:  Recent Labs Lab 09/19/15 1836  INR 1.06   Cardiac Enzymes:  Recent Labs Lab 09/18/15 0155 09/18/15 0400 09/18/15 0722  TROPONINI <0.03 <0.03 <0.03   BNP (last 3 results) No results for input(s):  PROBNP in the last 8760 hours. HbA1C:  Recent Labs  09/18/15 0400  HGBA1C 5.6   CBG: No results for input(s): GLUCAP in the last 168 hours. Lipid Profile:  Recent Labs  09/18/15 0400  CHOL 163  HDL 66  LDLCALC 66  TRIG 155*  CHOLHDL 2.5   Thyroid Function Tests:  Recent Labs  09/18/15 0400  TSH 3.616   Anemia Panel: No results for input(s): VITAMINB12, FOLATE, FERRITIN, TIBC, IRON, RETICCTPCT in the last 72 hours. Urine analysis: No results found for: COLORURINE, APPEARANCEUR, LABSPEC, PHURINE, GLUCOSEU, HGBUR, BILIRUBINUR, KETONESUR, PROTEINUR, UROBILINOGEN, NITRITE, LEUKOCYTESUR Sepsis Labs: @LABRCNTIP (procalcitonin:4,lacticidven:4)  )No results found for this or any previous visit (from the past 240 hour(s)).    Radiology Studies: No results found.   Scheduled Meds: . amLODipine  5 mg Oral Daily  . aspirin  81 mg Oral Daily  . atorvastatin  10 mg Oral Daily  . clopidogrel  75 mg Oral Daily  . enoxaparin (LOVENOX) injection  40 mg Subcutaneous QHS  . isosorbide mononitrate  120 mg Oral Daily  . isosorbide mononitrate  30 mg Oral QHS  . levothyroxine  75 mcg Oral QPM  . loratadine  10 mg Oral QPM  . metoprolol succinate  100 mg Oral Daily  . predniSONE  10 mg Oral Q breakfast  . ranolazine  1,000 mg Oral BID  . sodium chloride flush  3 mL Intravenous Q12H  . sodium chloride flush  3 mL Intravenous Q12H  . venlafaxine XR  225 mg Oral Q breakfast   Continuous Infusions:      Time Spent in minutes   30 minutes  Verland Sprinkle D.O. on 09/20/2015 at 1:42 PM  Between 7am to 7pm - Pager - [phone]  After 7pm go to [email or web address] - password [email or web address]  And look for the night coverage person covering for me after hours  [facility] Office  [phone]

## 2015-09-20 NOTE — H&P (View-Only) (Signed)
Subjective:  Pt has had recurrent chest pain with minimal activity; despite increased med.  Objective:   Vital Signs : Filed Vitals:   09/18/15 1322 09/18/15 2028 09/19/15 0602 09/19/15 0942  BP: 117/51 95/70 116/67 115/41  Pulse: 76 64 71 64  Temp: 97.8 F (36.6 C) 97.5 F (36.4 C) 97.8 F (36.6 C)   TempSrc: Oral Oral Oral   Resp: 18 19    Height:      Weight:      SpO2: 100% 100% 97%     Intake/Output from previous day:  Intake/Output Summary (Last 24 hours) at 09/19/15 1550 Last data filed at 09/18/15 1645  Gross per 24 hour  Intake    240 ml  Output      0 ml  Net    240 ml    I/O since admission: -80  Wt Readings from Last 3 Encounters:  09/18/15 216 lb (97.977 kg)  04/11/15 215 lb (97.523 kg)  02/21/15 216 lb 7.9 oz (98.2 kg)    Medications: . amLODipine  5 mg Oral Daily  . aspirin  81 mg Oral Daily  . atorvastatin  10 mg Oral Daily  . clopidogrel  75 mg Oral Daily  . enoxaparin (LOVENOX) injection  40 mg Subcutaneous QHS  . isosorbide mononitrate  120 mg Oral Daily  . isosorbide mononitrate  30 mg Oral QHS  . levothyroxine  75 mcg Oral QPM  . loratadine  10 mg Oral QPM  . metoprolol succinate  100 mg Oral Daily  . predniSONE  10 mg Oral Q breakfast  . ranolazine  1,000 mg Oral BID  . venlafaxine XR  225 mg Oral Q breakfast       Physical Exam:   General appearance: appears cushingnoid Neck: no adenopathy, no carotid bruit, no JVD, supple, symmetrical, trachea midline and thyroid not enlarged, symmetric, no tenderness/mass/nodules Lungs: clear to auscultation bilaterally Heart: regular rate and rhythm and 1/6 systolic murmur; no s3 Abdomen: obese; BS + Extremities: no edema, redness or tenderness in the calves or thighs Pulses: 2+ and symmetric Neurologic: Grossly normal   Rate: 68  Rhythm: normal sinus rhythm  ECG (independently read by me):  Lab Results:   Recent Labs  09/17/15 2017  NA 136  K 4.1  CL 104  CO2 25  GLUCOSE  144*  BUN 19  CREATININE 1.08*  CALCIUM 9.2    Hepatic Function Latest Ref Rng 01/01/2015 08/06/2009  Total Protein 6.5 - 8.1 g/dL 6.4(L) 7.4  Albumin 3.5 - 5.0 g/dL 3.3(L) 4.2  AST 15 - 41 U/L 18 17  ALT 14 - 54 U/L 13(L) 14  Alk Phosphatase 38 - 126 U/L 60 75  Total Bilirubin 0.3 - 1.2 mg/dL 0.4 0.4     Recent Labs  09/17/15 2017  WBC 11.8*  HGB 10.7*  HCT 33.8*  MCV 90.9  PLT 258     Recent Labs  09/18/15 0155 09/18/15 0400 09/18/15 0722  TROPONINI <0.03 <0.03 <0.03    Lab Results  Component Value Date   TSH 3.616 09/18/2015    Recent Labs  09/18/15 0400  HGBA1C 5.6    No results for input(s): PROT, ALBUMIN, AST, ALT, ALKPHOS, BILITOT, BILIDIR, IBILI in the last 72 hours. No results for input(s): INR in the last 72 hours. BNP (last 3 results) No results for input(s): BNP in the last 8760 hours.  ProBNP (last 3 results) No results for input(s): PROBNP in the last 8760 hours.  Lipid Panel     Component Value Date/Time   CHOL 163 09/18/2015 0400   TRIG 155* 09/18/2015 0400   HDL 66 09/18/2015 0400   CHOLHDL 2.5 09/18/2015 0400   VLDL 31 09/18/2015 0400   LDLCALC 66 09/18/2015 0400    Imaging:  Dg Chest 2 View  09/17/2015  CLINICAL DATA:  75 year old female with chest pain EXAM: CHEST  2 VIEW COMPARISON:  Chest radiograph dated 12/31/2014 FINDINGS: Two views of the chest do not demonstrate focal consolidation. There is no pleural effusion or pneumothorax. Stable cardiac silhouette. Median sternotomy wires and CABG vascular clips. No acute osseous pathology. IMPRESSION: No active cardiopulmonary disease. Electronically Signed   By: Anner Crete M.D.   On: 09/17/2015 21:38      Assessment/Plan:   Principal Problem:   Unstable angina (HCC) Active Problems:   Coronary artery disease involving coronary bypass graft of native heart   Hyperlipidemia LDL goal <70   1. Unstable Angina; Medical therapy was increased yesterday. Still with  recurrent CP with minimal activity such as washing or walking to the bathroom;  This was not present several weeks ago.  Currntly on ASA, plavix, ranexa 1000 bid, imdur  120 am, 30 hs, statin, metoprolol succinate 100 mg, and amlodipine now at 5 mg.  2. CAD: pt is s/p CABG in 2000  free LIMA Y graft to the diagonal, and distal LAD, SVG to the OM 1, and SVG to PDA.  9/16 presented with unstable angina s/p DES x 2 to 90% SVG-RCA. SVG to D1 was occluded.  - On 01/01/2015, she again presented with similar sx and relook angio did not show any new changes. recommended medical therapy.   3. Left subclavian stenosis.  4. Hyperlipidemia: onl;y on low dose atorva at 10 mg; will increase to 40 mg.   With recurrent symptomology despite increased amlodipine and nitrates yesterday recommend definitive cardiac cath. The risks and benefits of a cardiac catheterization including, but not limited to, death, stroke, MI, kidney damage and bleeding were discussed with the patient who indicates understanding and agrees to proceed.  Will set up with Dr. Irish Lack tomorrow who follows the pt in the office.  Troy Sine, MD, [facility] 09/19/2015, 3:50 PM

## 2015-09-20 NOTE — Progress Notes (Signed)
Patient sitting on side of bed, no needs at this time. Call light within reach.

## 2015-09-20 NOTE — Progress Notes (Signed)
Patient Name: Erin Ingram Date of Encounter: 09/20/2015  Principal Problem:   Unstable angina [facility]) Active Problems:   Coronary artery disease involving coronary bypass graft of native heart   Hyperlipidemia LDL goal <70   Primary Cardiologist: Dr. Irish Lack Patient Profile: This is a 75 y.o. 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 Presented with chest pain, went for left heart cath on 09/20/15  SUBJECTIVE: Feels ok, denies chest pain or SOB. Eager for her bed rest to be up.    OBJECTIVE Filed Vitals:   09/20/15 1247 09/20/15 1302 09/20/15 1317 09/20/15 1336  BP: 116/48 116/52 114/56 152/59  Pulse: 62 62 60 64  Temp:      TempSrc:      Resp:      Height:      Weight:      SpO2: 96% 96% 97% 99%   No intake or output data in the 24 hours ending 09/20/15 1448 Filed Weights   09/18/15 0023 09/18/15 0839 09/20/15 0652  Weight: 216 lb 11.4 oz (98.3 kg) 216 lb (97.977 kg) 213 lb 8 oz (96.843 kg)    PHYSICAL EXAM General: Well developed, well nourished,obese female in no acute distress. Head: Normocephalic, atraumatic.  Neck: Supple without bruits, hard to assess JVD. Lungs:  Resp regular and unlabored, CTA. Heart: RRR, S1, S2, no S3, S4, or murmur; no rub. Abdomen: Soft, non-tender, non-distended, BS + x 4.  Extremities: No clubbing, cyanosis, generalized edema, no pretibial edema.  Neuro: Alert and oriented X 3. Moves all extremities spontaneously. Psych: Normal affect.  LABS: CBC: Recent Labs  09/19/15 1836 09/20/15 0303  WBC 12.6* 13.6*  HGB 10.7* 10.8*  HCT 33.8* 34.7*  MCV 93.6 94.8  PLT 242 248   INR: Recent Labs  09/19/15 1836  INR 0000000   Basic Metabolic Panel: Recent Labs  09/19/15 1836 09/20/15 0303  NA 136 139  K 4.6 4.3  CL 104 105  CO2 26 28  GLUCOSE 214* 103*  BUN 19 18  CREATININE 1.27* 1.00  CALCIUM 8.6* 8.4*   Cardiac Enzymes: Recent Labs  09/18/15 0155 09/18/15 0400 09/18/15 0722  TROPONINI <0.03 <0.03 <0.03    Recent Labs  09/17/15 2021  TROPIPOC 0.00   Hemoglobin A1C: Recent Labs  09/18/15 0400  HGBA1C 5.6   Fasting Lipid Panel: Recent Labs  09/18/15 0400  CHOL 163  HDL 66  LDLCALC 66  TRIG 155*  CHOLHDL 2.5   Thyroid Function Tests: Recent Labs  09/18/15 0400  TSH 3.616     Current facility-administered medications:  .  0.9 %  sodium chloride infusion, 250 mL, Intravenous, PRN, Jettie Booze, MD .  0.9 %  sodium chloride infusion, 250 mL, Intravenous, PRN, Jettie Booze, MD .  acetaminophen (TYLENOL) tablet 650 mg, 650 mg, Oral, Q4H PRN, Etta Quill, DO .  amLODipine (NORVASC) tablet 5 mg, 5 mg, Oral, Daily, Troy Sine, MD, 5 mg at 09/20/15 1343 .  aspirin chewable tablet 81 mg, 81 mg, Oral, Daily, Etta Quill, DO, 81 mg at 09/19/15 0948 .  atorvastatin (LIPITOR) tablet 10 mg, 10 mg, Oral, Daily, Etta Quill, DO, 10 mg at 09/20/15 1342 .  clopidogrel (  PLAVIX) tablet 75 mg, 75 mg, Oral, Daily, Etta Quill, DO, 75 mg at 09/20/15 0759 .  enoxaparin (LOVENOX) injection 40 mg, 40 mg, Subcutaneous, QHS, Jared M Gardner, DO, 40 mg at 09/19/15 2245 .  fluticasone (FLONASE) 50 MCG/ACT nasal spray 2 spray, 2 spray, Each Nare, Daily PRN, Etta Quill, DO .  furosemide (LASIX) injection 40 mg, 40 mg, Intravenous, Once, [employer], DO .  isosorbide mononitrate (IMDUR) 24 hr tablet 120 mg, 120 mg, Oral, Daily, Jared M Gardner, DO, 120 mg at 09/20/15 1343 .  isosorbide mononitrate (IMDUR) 24 hr tablet 30 mg, 30 mg, Oral, QHS, Troy Sine, MD, 30 mg at 09/19/15 2242 .  levothyroxine (SYNTHROID, LEVOTHROID) tablet 75 mcg, 75 mcg, Oral, QPM, Etta Quill, DO, 75 mcg at 09/19/15  1709 .  loratadine (CLARITIN) tablet 10 mg, 10 mg, Oral, QPM, Etta Quill, DO, 10 mg at 09/19/15 1709 .  metoprolol succinate (TOPROL-XL) 24 hr tablet 100 mg, 100 mg, Oral, Daily, Etta Quill, DO, 100 mg at 09/20/15 1354 .  ondansetron (ZOFRAN) injection 4 mg, 4 mg, Intravenous, Q6H PRN, Etta Quill, DO, 4 mg at 09/18/15 1001 .  predniSONE (DELTASONE) tablet 10 mg, 10 mg, Oral, Q breakfast, Etta Quill, DO, 10 mg at 09/19/15 [id number] .  ranolazine (RANEXA) 12 hr tablet 1,000 mg, 1,000 mg, Oral, BID, Etta Quill, DO, 1,000 mg at 09/20/15 1342 .  sodium chloride flush (NS) 0.9 % injection 3 mL, 3 mL, Intravenous, Q12H, Jettie Booze, MD, 3 mL at 09/20/15 1355 .  sodium chloride flush (NS) 0.9 % injection 3 mL, 3 mL, Intravenous, PRN, Jettie Booze, MD .  sodium chloride flush (NS) 0.9 % injection 3 mL, 3 mL, Intravenous, Q12H, Jettie Booze, MD, 3 mL at 09/20/15 1215 .  sodium chloride flush (NS) 0.9 % injection 3 mL, 3 mL, Intravenous, PRN, Jettie Booze, MD .  venlafaxine XR [facility]) 24 hr capsule 225 mg, 225 mg, Oral, Q breakfast, Jared M Gardner, DO, 225 mg at 09/19/15 0941    TELE:    NSR     ECG: NSR  Left Heart Cath and Cors/Grafts Angiography 09/20/15  Severe three vessel CAD.  Patent SVG to OM.  Patent SVG to PDA. Patent stents in this graft.  Y graft with free LIMA to LAD and SVG to diagonal. LIMA portion is patent while vein graft to diagonal is occluded.  Elevated LVEDP 33 mm Hg.  No change in coronary anatomy since October 2016. Patent stents in the SVG to PDA. Elevated LVEDP. Would diurese with Lasix to help reduce intravascular volume   Current Medications:  . amLODipine  5 mg Oral Daily  . aspirin  81 mg Oral Daily  . atorvastatin  10 mg Oral Daily  . clopidogrel  75 mg Oral Daily  . enoxaparin (LOVENOX) injection  40 mg Subcutaneous QHS  . furosemide  40 mg Intravenous Once  . isosorbide mononitrate  120 mg Oral Daily   . isosorbide mononitrate  30 mg Oral QHS  . levothyroxine  75 mcg Oral QPM  . loratadine  10 mg Oral QPM  . metoprolol succinate  100 mg Oral Daily  . predniSONE  10 mg Oral Q breakfast  . ranolazine  1,000 mg Oral BID  . sodium chloride flush  3 mL Intravenous Q12H  . sodium chloride flush  3 mL Intravenous Q12H  . venlafaxine XR  225 mg Oral Q breakfast  ASSESSMENT AND PLAN: Principal Problem:   Unstable angina (HCC) Active Problems:   Coronary artery disease involving coronary bypass graft of native heart   Hyperlipidemia LDL goal <70  1. Unstable Angina: Medical therapy was increased, still with recurrent CP with minimal activity such as washing or walking to the bathroom; This was not present several weeks ago. Currntly on ASA, plavix, ranexa 1000 bid, imdur 120 am, 30 hs, statin, metoprolol succinate 100 mg, and amlodipine now at 5 mg. Went for heart cath today, anatomy unchanged since Oct. 2016. She was volume overloaded with LVEDP of 33 mm Hg. Received one dose of IV Lasix so far, will continue tomorrow.   2. CAD: pt is s/p CABG in 2000: free LIMA Y graft to the diagonal, and distal LAD, SVG to the OM 1, and SVG to PDA.  In 11/2014 presented with unstable angina s/p DES x 2 to 90% SVG-RCA. SVG to D1 was occluded.  On 01/01/2015, she again presented with similar sx and relook angio did not show any new changes. recommended medical therapy.   3. Left subclavian stenosis.  4. Hyperlipidemia: only on low dose atorva at 10 mg; will increase to 40 mg.   5. Constipation: Would like Metamucil, will order.   Signed, Arbutus Leas , NP 2:48 PM 09/20/2015 Pager [phone] Erin Ingram is a 75 y.o. female   Patient seen and examined. Agree with assessment and plan. Pt underwent cath earlier today. Data reviewed. No significant change from 12/2014 with occluded SVG to diagonal. Increased LVEDP at 33 mmHG. Will continue diuresis.  R groin site is stable. Probable dc  in am if stable.   Troy Sine, MD, [facility] 09/20/2015 3:50 PM

## 2015-09-20 NOTE — Progress Notes (Signed)
Site area: rt groin fa sheath pulled and pressure held by [place] Site Prior to Removal:  Level 0 Pressure Applied For:  20 minutes Manual:   yes Patient Status During Pull:  stable Post Pull Site:  Level  0 Post Pull Instructions Given:  yes Post Pull Pulses Present: yess Dressing Applied:  tegaderm Bedrest begins @  1200 Comments:

## 2015-09-20 NOTE — Progress Notes (Signed)
Pt has returned from cath lab. Right femoral site is a level 0 upon assessment. Vitals are stable. Pt is resting in bed at this time, with call light within reach. Pt is on bedrest. Bedrest will end at 1600. Will continue to monitor.   Grant Fontana BSN, RN

## 2015-09-20 NOTE — Interval H&P Note (Signed)
Cath Lab Visit (complete for each Cath Lab visit)  Clinical Evaluation Leading to the Procedure:   ACS: Yes.    Non-ACS:    Anginal Classification: CCS IV  Anti-ischemic medical therapy: No Therapy  Non-Invasive Test Results: No non-invasive testing performed  Prior CABG: Previous CABG      History and Physical Interval Note:  09/20/2015 10:56 AM  Erin Ingram  has presented today for surgery, with the diagnosis of chest pain  The various methods of treatment have been discussed with the patient and family. After consideration of risks, benefits and other options for treatment, the patient has consented to  Procedure(s): Left Heart Cath and Cors/Grafts Angiography (N/A) as a surgical intervention .  The patient's history has been reviewed, patient examined, no change in status, stable for surgery.  I have reviewed the patient's chart and labs.  Questions were answered to the patient's satisfaction.     Larae Grooms

## 2015-09-21 DIAGNOSIS — F329 Major depressive disorder, single episode, unspecified: Secondary | ICD-10-CM | POA: Diagnosis not present

## 2015-09-21 DIAGNOSIS — E039 Hypothyroidism, unspecified: Secondary | ICD-10-CM | POA: Diagnosis not present

## 2015-09-21 DIAGNOSIS — I200 Unstable angina: Secondary | ICD-10-CM | POA: Diagnosis not present

## 2015-09-21 DIAGNOSIS — I739 Peripheral vascular disease, unspecified: Secondary | ICD-10-CM

## 2015-09-21 DIAGNOSIS — E785 Hyperlipidemia, unspecified: Secondary | ICD-10-CM | POA: Diagnosis not present

## 2015-09-21 LAB — CBC
HCT: 32.8 % — ABNORMAL LOW (ref 36.0–46.0)
Hemoglobin: 10.3 g/dL — ABNORMAL LOW (ref 12.0–15.0)
MCH: 29.2 pg (ref 26.0–34.0)
MCHC: 31.4 g/dL (ref 30.0–36.0)
MCV: 92.9 fL (ref 78.0–100.0)
Platelets: 258 10*3/uL (ref 150–400)
RBC: 3.53 MIL/uL — ABNORMAL LOW (ref 3.87–5.11)
RDW: 14.5 % (ref 11.5–15.5)
WBC: 13 10*3/uL — ABNORMAL HIGH (ref 4.0–10.5)

## 2015-09-21 LAB — BASIC METABOLIC PANEL
Anion gap: 8 (ref 5–15)
BUN: 18 mg/dL (ref 6–20)
CO2: 27 mmol/L (ref 22–32)
Calcium: 8.3 mg/dL — ABNORMAL LOW (ref 8.9–10.3)
Chloride: 104 mmol/L (ref 101–111)
Creatinine, Ser: 1.05 mg/dL — ABNORMAL HIGH (ref 0.44–1.00)
GFR calc Af Amer: 58 mL/min — ABNORMAL LOW (ref >60)
GFR calc non Af Amer: 50 mL/min — ABNORMAL LOW (ref >60)
Glucose, Bld: 104 mg/dL — ABNORMAL HIGH (ref 65–99)
Potassium: 4.3 mmol/L (ref 3.5–5.1)
Sodium: 139 mmol/L (ref 135–145)

## 2015-09-21 MED ORDER — ISOSORBIDE MONONITRATE ER 30 MG PO TB24: 30.0000 mg | ORAL_TABLET | Freq: Every day | ORAL | Status: DC

## 2015-09-21 MED ORDER — FUROSEMIDE 20 MG PO TABS
20.0000 mg | ORAL_TABLET | Freq: Every day | ORAL | Status: DC
  Administered 2015-09-21: 20 mg via ORAL
  Filled 2015-09-21: qty 1

## 2015-09-21 MED ORDER — AMLODIPINE BESYLATE 5 MG PO TABS: 5.0000 mg | ORAL_TABLET | Freq: Every day | ORAL | Status: DC

## 2015-09-21 MED ORDER — FUROSEMIDE 20 MG PO TABS: 20.0000 mg | ORAL_TABLET | Freq: Every day | ORAL | Status: DC

## 2015-09-21 NOTE — Progress Notes (Signed)
Patient Name: Erin Ingram Date of Encounter: 09/21/2015  Principal Problem:   Unstable angina [facility]) Active Problems:   Coronary artery disease involving coronary bypass graft of native heart   Hyperlipidemia LDL goal <70   Primary Cardiologist: Dr. Irish Lack Patient Profile: This is a 75 y.o. 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 Presented with chest pain, went for left heart cath on 09/20/15  SUBJECTIVE: Still with occasional CP; positive DOE  OBJECTIVE Filed Vitals:   09/20/15 1607 09/20/15 2132 09/21/15 0410 09/21/15 0928  BP: 101/44 135/52 110/50 119/46  Pulse: 64 77 86 74  Temp:  97.6 F (36.4 C) 98.3 F (36.8 C)   TempSrc:  Oral Oral   Resp:  18 18   Height:      Weight:      SpO2: 97% 98% 100%     Intake/Output Summary (Last 24 hours) at 09/21/15 1100 Last data filed at 09/20/15 1841  Gross per 24 hour  Intake      0 ml  Output    950 ml  Net   -950 ml   Filed Weights   09/18/15 0023 09/18/15 0839 09/20/15 0652  Weight: 216 lb 11.4 oz (98.3 kg) 216 lb (97.977 kg) 213 lb 8 oz (96.843 kg)    PHYSICAL EXAM General: Well developed, obese female in no acute distress. Head: Normal Neck: Supple  Lungs:  CTA. Heart: RRR Abdomen: Soft, non-tender, non-distended Extremities: No  edema.  Neuro: grossly intact Psych: Normal affect.  LABS: CBC:  Recent Labs  09/20/15 0303 09/21/15 0256  WBC 13.6* 13.0*  HGB 10.8* 10.3*  HCT 34.7* 32.8*  MCV 94.8 92.9  PLT 248 258   INR:  Recent Labs  09/19/15 1836  INR 0000000   Basic Metabolic Panel:  Recent Labs  09/20/15 0303 09/21/15 0256  NA 139 139  K 4.3 4.3  CL 105 104  CO2 28 27  GLUCOSE 103* 104*  BUN 18 18  CREATININE 1.00  1.05*  CALCIUM 8.4* 8.3*     Current facility-administered medications:  .  0.9 %  sodium chloride infusion, 250 mL, Intravenous, PRN, Jettie Booze, MD .  0.9 %  sodium chloride infusion, 250 mL, Intravenous, PRN, Jettie Booze, MD .  acetaminophen (TYLENOL) tablet 650 mg, 650 mg, Oral, Q4H PRN, Etta Quill, DO .  amLODipine (NORVASC) tablet 5 mg, 5 mg, Oral, Daily, Troy Sine, MD, 5 mg at 09/21/15 [id number] .  aspirin chewable tablet 81 mg, 81 mg, Oral, Daily, Etta Quill, DO, 81 mg at 09/21/15 [id number] .  atorvastatin (LIPITOR) tablet 10 mg, 10 mg, Oral, Daily, Etta Quill, DO, 10 mg at 09/21/15 [id number] .  clopidogrel (PLAVIX) tablet 75 mg, 75 mg, Oral, Daily, Etta Quill, DO, 75 mg at 09/21/15 [id number] .  enoxaparin (LOVENOX) injection 40 mg, 40 mg, Subcutaneous, QHS, Jared M Gardner, DO, 40 mg at 09/20/15 2132 .  fluticasone (FLONASE) 50 MCG/ACT nasal spray 2 spray, 2 spray, Each Nare, Daily PRN, Etta Quill, DO .  isosorbide mononitrate (IMDUR) 24 hr tablet 120 mg, 120 mg, Oral, Daily, Etta Quill, DO, 120 mg at 09/21/15 [id number] .  isosorbide mononitrate (IMDUR) 24  hr tablet 30 mg, 30 mg, Oral, QHS, Troy Sine, MD, 30 mg at 09/20/15 2129 .  levothyroxine (SYNTHROID, LEVOTHROID) tablet 75 mcg, 75 mcg, Oral, QPM, Etta Quill, DO, 75 mcg at 09/20/15 1631 .  loratadine (CLARITIN) tablet 10 mg, 10 mg, Oral, QPM, Etta Quill, DO, 10 mg at 09/20/15 1632 .  metoprolol succinate (TOPROL-XL) 24 hr tablet 100 mg, 100 mg, Oral, Daily, Etta Quill, DO, 100 mg at 09/21/15 [id number] .  ondansetron (ZOFRAN) injection 4 mg, 4 mg, Intravenous, Q6H PRN, Etta Quill, DO, 4 mg at 09/18/15 1001 .  predniSONE (DELTASONE) tablet 10 mg, 10 mg, Oral, Q breakfast, Etta Quill, DO, 10 mg at 09/21/15 0933 .  psyllium (HYDROCIL/METAMUCIL) packet 1 packet, 1 packet, Oral, Daily, Arbutus Leas, NP, 1 packet at 09/21/15 [phone] .  ranolazine (RANEXA) 12 hr tablet 1,000 mg, 1,000 mg,  Oral, BID, Etta Quill, DO, 1,000 mg at 09/21/15 [id number] .  sodium chloride flush (NS) 0.9 % injection 3 mL, 3 mL, Intravenous, Q12H, Jettie Booze, MD, 3 mL at 09/21/15 0942 .  sodium chloride flush (NS) 0.9 % injection 3 mL, 3 mL, Intravenous, PRN, Jettie Booze, MD .  sodium chloride flush (NS) 0.9 % injection 3 mL, 3 mL, Intravenous, Q12H, Jettie Booze, MD, 3 mL at 09/21/15 0941 .  sodium chloride flush (NS) 0.9 % injection 3 mL, 3 mL, Intravenous, PRN, Jettie Booze, MD .  venlafaxine XR [facility]) 24 hr capsule 225 mg, 225 mg, Oral, Q breakfast, Jared M Gardner, DO, 225 mg at 09/21/15 [id number]    TELE:    NSR     ECG: NSR  Left Heart Cath and Cors/Grafts Angiography 09/20/15  Severe three vessel CAD.  Patent SVG to OM.  Patent SVG to PDA. Patent stents in this graft.  Y graft with free LIMA to LAD and SVG to diagonal. LIMA portion is patent while vein graft to diagonal is occluded.  Elevated LVEDP 33 mm Hg.  No change in coronary anatomy since October 2016. Patent stents in the SVG to PDA. Elevated LVEDP. Would diurese with Lasix to help reduce intravascular volume   Current Medications:  . amLODipine  5 mg Oral Daily  . aspirin  81 mg Oral Daily  . atorvastatin  10 mg Oral Daily  . clopidogrel  75 mg Oral Daily  . enoxaparin (LOVENOX) injection  40 mg Subcutaneous QHS  . isosorbide mononitrate  120 mg Oral Daily  . isosorbide mononitrate  30 mg Oral QHS  . levothyroxine  75 mcg Oral QPM  . loratadine  10 mg Oral QPM  . metoprolol succinate  100 mg Oral Daily  . predniSONE  10 mg Oral Q breakfast  . psyllium  1 packet Oral Daily  . ranolazine  1,000 mg Oral BID  . sodium chloride flush  3 mL Intravenous Q12H  . sodium chloride flush  3 mL Intravenous Q12H  . venlafaxine XR  225 mg Oral Q breakfast      ASSESSMENT AND PLAN: Principal Problem:   Unstable angina (HCC) Active Problems:   Coronary artery disease involving coronary bypass  graft of native heart   Hyperlipidemia LDL goal <70  1. Unstable Angina: Medical therapy was increased. Continue present meds.  2. CAD: pt is s/p CABG in 2000: free LIMA Y graft to the diagonal, and distal LAD, SVG to the OM 1, and SVG to PDA.  In 11/2014 presented with unstable angina  s/p DES x 2 to 90% SVG-RCA. SVG to D1 was occluded.  On 01/01/2015, she again presented with similar sx and relook angio did not show any new changes. recommended medical therapy.   3. Left subclavian stenosis.  4. Hyperlipidemia: continue statin  5. Chronic diastolic CHF: LVEDP elevated at cath; add lasix 20 mg daily; check BMET one week.  Patient can be DCed from a cardiac standpoint and FU with Dr Irish Lack 2-4 weeks  Signed, Kirk Ruths , MD, [facility] 09/21/2015 11:00 AM

## 2015-09-21 NOTE — Discharge Instructions (Signed)

## 2015-09-21 NOTE — Discharge Summary (Addendum)
Physician Discharge Summary  Erin Ingram [id number] DOB: 1940/09/21 DOA: 09/17/2015  PCP: Erin Pac, MD  Admit date: 09/17/2015 Discharge date: 09/21/2015  Time spent: 45 minutes  Recommendations for Outpatient Follow-up:  Patient will be discharged to home.  Patient will need to follow up with primary care provider within one week of discharge, repeat BMP.  Follow up with cardiology, Dr. Irish Lack. Patient should continue medications as prescribed.  Patient should follow a heart healthy diet.   Discharge Diagnoses:  Unstable angina Coronary artery disease Peripheral arterial disease Hyperlipidemia Hypothyroidism Depression  Discharge Condition: Stable  Diet recommendation: heat healthy  Filed Weights   09/18/15 0023 09/18/15 0839 09/20/15 [id number]  Weight: 98.3 kg (216 lb 11.4 oz) 97.977 kg (216 lb) 96.843 kg (213 lb 8 oz)    History of present illness:  On 09/18/2015 by Dr. Jennette Kettle Erin Ingram is a 75 y.o. female with medical history significant of CAD s/p CABG in 2000, multiple stents to bypass grafts in following years most recently 2 overlapping stents done in Sept of last year. Patient presents to the ED with c/o chest pain. Pain radiates to L arm and L jaw, 5/10 intensity, NTG makes it slightly better so she has taken 8 NTG tabs over the course of the past day. Activity causes worsening SOB.  Hospital Course:  Unstable angina -Patient has history of microvascular angina -Presented with shortness of breath and exertional pain -Troponins have been cycled and unremarkable, EKG shows no acute changes -Continue aspirin, Plavix, imudr, Ranexa, statin, metoprolol -Cardiology consulted and appreciated, increased amlodipine to 5mg  daily and imdur 30mg  QHS (in addition to her 120mg  dose).  -Patient continues to have pain.  -Heart catheterization: severe 3 vessel CAD, patent SVG to OM and PDA. Elevated LVEDP. Diurese with lasix.  -Continue lasix 20mg   daily. Monitor BMP in one week -Follow up with Dr. Irish Lack.   Coronary artery disease -Status post CABG in 2000 -Treatment plan as above  Peripheral arterial disease -Cardiology reviewed cath films to evaluate for LIMA steel syndrome  Hyperlipidemia -Continue statin  Hypothyroidism -Continue Synthroid  Depression -Continue Effexor   Consultants Cardiology   Procedures  Heart catheterization  Discharge Exam: Filed Vitals:   09/21/15 0410 09/21/15 0928  BP: 110/50 119/46  Pulse: 86 74  Temp: 98.3 F (36.8 C)   Resp: 18     Exam  General: Well developed, well nourished, No distress  HEENT: NCAT, mucous membranes moist.   Cardiovascular: S1 S2 auscultated, no murmurs, RRR  Respiratory: Clear to auscultation bilaterally  Abdomen: Soft, nontender, nondistended, + bowel sounds  Extremities: warm dry without cyanosis clubbing or edema  Neuro: AAOx3, nonfocal  Psych: Appropriate mood and affect, pleasant  Discharge Instructions      Discharge Instructions    Discharge instructions    Complete by:  As directed   Patient will be discharged to home.  Patient will need to follow up with primary care provider within one week of discharge.  Follow up with cardiology, Dr. Irish Lack. Patient should continue medications as prescribed.  Patient should follow a heart healthy diet.            Medication List    TAKE these medications        amLODipine 5 MG tablet  Commonly known as:  NORVASC  Take 1 tablet (5 mg total) by mouth daily.     aspirin 81 MG chewable tablet  Chew 1 tablet (81 mg total) by mouth daily.  atorvastatin 10 MG tablet  Commonly known as:  LIPITOR  Take 10 mg by mouth daily.     Cinnamon 500 MG Tabs  Take 1 each by mouth daily.     clopidogrel 75 MG tablet  Commonly known as:  PLAVIX  TAKE 1 TABLET ONCE DAILY.     Coenzyme Q10 10 MG capsule  Take 10 mg by mouth daily.     FISH OIL PO  Take 1 tablet by mouth daily.      fluocinonide cream 0.05 %  Commonly known as:  LIDEX  Apply 1 application topically 2 (two) times daily as needed (rash).     fluticasone 50 MCG/ACT nasal spray  Commonly known as:  FLONASE  Place 2 sprays into both nostrils daily as needed for allergies.     furosemide 20 MG tablet  Commonly known as:  LASIX  Take 1 tablet (20 mg total) by mouth daily.     isosorbide mononitrate 60 MG 24 hr tablet  Commonly known as:  IMDUR  Take 2 tablets by mouth  once a day     isosorbide mononitrate 30 MG 24 hr tablet  Commonly known as:  IMDUR  Take 1 tablet (30 mg total) by mouth at bedtime.     levocetirizine 5 MG tablet  Commonly known as:  XYZAL  Take 5 mg by mouth every evening.     levothyroxine 75 MCG tablet  Commonly known as:  SYNTHROID, LEVOTHROID  Take 75 mcg by mouth every evening.     Melatonin 1 MG Tabs  Take 5 mg by mouth at bedtime as needed (SLEEP). Reported on 03/22/2015     metoprolol succinate 100 MG 24 hr tablet  Commonly known as:  TOPROL-XL  Take 1 tablet by mouth once a day     nitroGLYCERIN 0.4 MG SL tablet  Commonly known as:  NITROSTAT  Place 1 tablet (0.4 mg total) under the tongue every 5 (five) minutes as needed for chest pain.     predniSONE 1 MG tablet  Commonly known as:  DELTASONE  Take 10 mg by mouth daily with breakfast.     PRELIEF PO  Take 1 tablet by mouth 2 (two) times daily with a meal.     Psyllium 400 MG Caps  Take 400 mg by mouth daily.     RANEXA 1000 MG SR tablet  Generic drug:  ranolazine  Take 1 tablet by mouth  twice a day     venlafaxine XR 75 MG 24 hr capsule  Commonly known as:  EFFEXOR-XR  Take 225 mg by mouth daily with breakfast. Patient takes 3 capsules     VITAMIN D PO  Take 1 tablet by mouth daily.       Allergies  Allergen Reactions  . Stadol [Butorphanol] Other (See Comments)    agitation Constipation  . Sulfa Antibiotics Rash  . Bisoprolol Fumarate Other (See Comments)    Doesn't remember  .  Butorphanol Tartrate Other (See Comments)    Produced a lot of urine, made patient feel crazy  . Codeine Nausea And Vomiting  . Demerol [Meperidine] Nausea And Vomiting  . Imipramine     Sweating, facial dysfunction   . Statins     MYALGIAS  . Tequin [Gatifloxacin] Other (See Comments)    Caused hypoglycemia Low blood sugar  . Brilinta [Ticagrelor] Rash    CAUSES PETECHIAE, PURPURA  . Pseudoephedrine Hcl Palpitations  . Septra [Sulfamethoxazole-Trimethoprim] Rash  . Sulfamethoxazole-Trimethoprim Rash  Follow-up Information    Follow up with Erin Pac, MD. Schedule an appointment as soon as possible for a visit in 1 week.   Specialty:  Family Medicine   Why:  Hospital follow up   Contact information:   [place] [address] [phone]       Follow up with Larae Grooms, MD. Schedule an appointment as soon as possible for a visit in 2 weeks.   Specialties:  Cardiology, Radiology, Interventional Cardiology   Why:  Hospital follow up   Contact information:   [id number] [place] [address] [phone]        The results of significant diagnostics from this hospitalization (including imaging, microbiology, ancillary and laboratory) are listed below for reference.    Significant Diagnostic Studies: Dg Chest 2 View  09/17/2015  CLINICAL DATA:  75 year old female with chest pain EXAM: CHEST  2 VIEW COMPARISON:  Chest radiograph dated 12/31/2014 FINDINGS: Two views of the chest do not demonstrate focal consolidation. There is no pleural effusion or pneumothorax. Stable cardiac silhouette. Median sternotomy wires and CABG vascular clips. No acute osseous pathology. IMPRESSION: No active cardiopulmonary disease. Electronically Signed   By: Anner Crete M.D.   On: 09/17/2015 21:38    Microbiology: No results found for this or any previous visit (from the past 240 hour(s)).   Labs: Basic Metabolic Panel:  Recent  Labs Lab 09/17/15 2017 09/19/15 1836 09/20/15 0303 09/21/15 0256  NA 136 136 139 139  K 4.1 4.6 4.3 4.3  CL 104 104 105 104  CO2 25 26 28 27   GLUCOSE 144* 214* 103* 104*  BUN 19 19 18 18   CREATININE 1.08* 1.27* 1.00 1.05*  CALCIUM 9.2 8.6* 8.4* 8.3*   Liver Function Tests: No results for input(s): AST, ALT, ALKPHOS, BILITOT, PROT, ALBUMIN in the last 168 hours. No results for input(s): LIPASE, AMYLASE in the last 168 hours. No results for input(s): AMMONIA in the last 168 hours. CBC:  Recent Labs Lab 09/17/15 2017 09/19/15 1836 09/20/15 0303 09/21/15 0256  WBC 11.8* 12.6* 13.6* 13.0*  HGB 10.7* 10.7* 10.8* 10.3*  HCT 33.8* 33.8* 34.7* 32.8*  MCV 90.9 93.6 94.8 92.9  PLT 258 242 248 258   Cardiac Enzymes:  Recent Labs Lab 09/18/15 0155 09/18/15 0400 09/18/15 0722  TROPONINI <0.03 <0.03 <0.03   BNP: BNP (last 3 results) No results for input(s): BNP in the last 8760 hours.  ProBNP (last 3 results) No results for input(s): PROBNP in the last 8760 hours.  CBG: No results for input(s): GLUCAP in the last 168 hours.     SignedCristal Ford  [facility] 09/21/2015, 1:33 PM

## 2015-09-21 NOTE — Progress Notes (Signed)
Patient requested to have her site checked. Advised her that the site was fine but that she had redness and irritation in the skin folds in her pelvic area. Said that she had applied cream earlier, just wanted to make sure the site was fine.

## 2015-09-23 MED FILL — Fentanyl Citrate Preservative Free (PF) Inj 100 MCG/2ML: INTRAMUSCULAR | Qty: 2 | Status: AC

## 2015-10-07 NOTE — Progress Notes (Signed)
Cardiology Office Note:    Date:  10/08/2015   ID:  Erin Ingram, DOB 12-29-1940, MRN [id number]  PCP:  [name], MD  Cardiologist:  Dr. Casandra Doffing   Electrophysiologist:  n/a  Referring MD: Hulan Fess, MD   Chief Complaint  Patient presents with  . Hospitalization Follow-up    [place] >> Cath with stable anatomy >> med Rx    History of Present Illness:    Erin Ingram is a 75 y.o. female with a hx of CAD status post CABG in 2000, HTN, HL, sleep apnea, PMR, L subclavian stenosis. She had repeat cardiac catheterization in 2008 that demonstrated patent grafts. She's had a history of persistent angina treated with EECP many years ago. She's been treated with Ranexa, metoprolol, amlodipine, isosorbide.   Admitted 9/16 with worsening angina. LHC demonstrated 90% distal SVG-RCA stenosis. This was treated with 2 overlapping Synergy DES. SVG-OM2 was patent. The Y graft with a patent LIMA-LAD limb but SVG-diagonal was occluded.  Readmitted 10/16 with recurrent angina.  Relook cardiac catheterization demonstrated patent stents in the vein graft to the RCA and otherwise stable anatomy. Continued medical therapy was recommended.   Last seen by Dr. Irish Lack 1/17. She was admitted 6/27-7/1 with unstable angina. Cardiac enzymes remained normal. LHC demonstrated stable anatomy. Medical therapy was continued. LVEDP was elevated and Lasix was added to her medical regimen. She returns for follow-up.   She is here alone. Since DC from the hospital, she continues to have occasional episodes of left arm discomfort. This is no worse. She denies chest pain.  Overall, her symptoms are decreased. She does feel tired. She has chronic dyspnea on exertion. Overall, this is better. She denies orthopnea PND. She denies LE edema. She denies syncope or near syncope.   Past Medical History  Diagnosis Date  . Coronary artery disease     a. s/p CABG 2000 (SVG/ free LIMA Y graft to the diagonal and  distal LAD, SVG to the OM 1, and SVG to the PDA)  // b. Cath 12/19/2014 90% dSVG to RCA s/p 2 overlapping DES (3.5 x 28 and 3.5 x 8 mm Synergy)  // c. LHC 10/16: stable anatomy, patent S-RCA stents  //  d. LHC 6/17:  L-LAD of Y graft patent, S-Dx of Y graft occluded, S-OM2 ok, S-RCA stents ok, LVEDP 33  . Hyperlipidemia   . OSA on CPAP   . Depression     with anxious component  . Obesity   . Stable angina (HCC)     microvascular, improved with Ranexa  . PMR (polymyalgia rheumatica) (HCC)   . (HFpEF) heart failure with preserved ejection fraction (HCC)     a. normal EF, LVEDP at [facility] in 6/17 33 >> Lasix started     Past Surgical History  Procedure Laterality Date  . Cataract extraction Bilateral 2011  . Breast biopsy  1964  . Repair of left patellar  1993  . Cardiac catheterization  08    patent grafts, no culprit lesions, EF 65%  . Coronary artery bypass graft  2000    ASCVD, multivessel, S./P.  Marland Kitchen Cardiac catheterization N/A 12/19/2014    Procedure: Left Heart Cath and Cors/Grafts Angiography;  Surgeon: Jettie Booze, MD;  Location: [place] [facility];  Service: Cardiovascular;  Laterality: N/A;  . Cardiac catheterization N/A 12/19/2014    Procedure: Coronary Stent Intervention;  Surgeon: Jettie Booze, MD;  Location: [place] [facility];  Service: Cardiovascular;  Laterality: N/A;  .  Cardiac catheterization N/A 01/01/2015    Procedure: Left Heart Cath and Cors/Grafts Angiography;  Surgeon: Jettie Booze, MD;  Location: [place] [facility];  Service: Cardiovascular;  Laterality: N/A;  . Cardiac catheterization N/A 09/20/2015    Procedure: Left Heart Cath and Cors/Grafts Angiography;  Surgeon: Jettie Booze, MD;  Location: [place] [facility];  Service: Cardiovascular;  Laterality: N/A;    Current Medications: Outpatient Prescriptions Prior to Visit  Medication Sig Dispense Refill  . amLODipine (NORVASC) 5 MG tablet Take 1 tablet (5 mg total) by mouth daily. 30  tablet 0  . aspirin 81 MG chewable tablet Chew 1 tablet (81 mg total) by mouth daily.    Marland Kitchen atorvastatin (LIPITOR) 10 MG tablet Take 10 mg by mouth daily.    . Calcium Glycerophosphate (PRELIEF PO) Take 1 tablet by mouth daily.     . Cholecalciferol (VITAMIN D PO) Take 1 tablet by mouth daily.    . Cinnamon 500 MG TABS Take 1 each by mouth daily.    . clopidogrel (PLAVIX) 75 MG tablet TAKE 1 TABLET ONCE DAILY. 90 tablet 2  . Coenzyme Q10 10 MG capsule Take 10 mg by mouth daily.    . fluocinonide cream (LIDEX) [id number] % Apply 1 application topically 2 (two) times daily as needed (rash).     . fluticasone (FLONASE) 50 MCG/ACT nasal spray Place 2 sprays into both nostrils daily as needed for allergies.     . isosorbide mononitrate (IMDUR) 30 MG 24 hr tablet Take 1 tablet (30 mg total) by mouth at bedtime. 30 tablet 0  . isosorbide mononitrate (IMDUR) 60 MG 24 hr tablet Take 2 tablets by mouth  once a day 180 tablet 3  . levocetirizine (XYZAL) 5 MG tablet Take 5 mg by mouth every evening.    Marland Kitchen levothyroxine (SYNTHROID, LEVOTHROID) 75 MCG tablet Take 75 mcg by mouth every evening.     . Melatonin 1 MG TABS Take 5 mg by mouth at bedtime as needed (SLEEP). Reported on 03/22/2015    . metoprolol succinate (TOPROL-XL) 100 MG 24 hr tablet Take 1 tablet by mouth once a day 90 tablet 1  . nitroGLYCERIN (NITROSTAT) 0.4 MG SL tablet Place 1 tablet (0.4 mg total) under the tongue every 5 (five) minutes as needed for chest pain. 25 tablet 3  . Omega-3 Fatty Acids (FISH OIL PO) Take 1 tablet by mouth daily.     . predniSONE (DELTASONE) 1 MG tablet Take 10 mg by mouth daily with breakfast.     . Psyllium 400 MG CAPS Take 400 mg by mouth daily.    Marland Kitchen RANEXA 1000 MG SR tablet Take 1 tablet by mouth  twice a day 180 tablet 2  . venlafaxine XR (EFFEXOR-XR) 75 MG 24 hr capsule Take 225 mg by mouth daily with breakfast. Patient takes 3 capsules    . furosemide (LASIX) 20 MG tablet Take 1 tablet (20 mg total) by mouth  daily. 30 tablet 0   No facility-administered medications prior to visit.      Allergies:   Stadol; Sulfa antibiotics; Bisoprolol fumarate; Butorphanol tartrate; Codeine; Demerol; Imipramine; Statins; Tequin; Brilinta; Pseudoephedrine hcl; Septra; and Sulfamethoxazole-trimethoprim   Social History   Social History  . Marital Status: Married    Spouse Name: N/A  . Number of Children: N/A  . Years of Education: N/A   Occupational History  . Retired Cytogeneticist    Social History Main Topics  . Smoking status: Former Smoker  Quit date: 08/21/1969  . Smokeless tobacco: Never Used  . Alcohol Use: 0.0 oz/week    0 Standard drinks or equivalent per week     Comment: Glass of wine 2-3 x year  . Drug Use: No  . Sexual Activity: Not Asked   Other Topics Concern  . None   Social History Narrative   Lives in [place] with husband.      Family History:  The patient's family history includes Diabetes in her brother; Heart attack in her father and mother; Heart disease in her father and mother; Stroke in her paternal grandmother. There is no history of Hypertension.   ROS:   Please see the history of present illness.    ROS All other systems reviewed and are negative.   Physical Exam:    VS:  BP 102/30 mmHg  Pulse 70  Ht 4\' 10"  (1.473 m)  Wt 218 lb 3.2 oz (98.975 kg)  BMI 45.62 kg/m2  SpO2 92%    Wt Readings from Last 3 Encounters:  10/08/15 218 lb 3.2 oz (98.975 kg)  09/20/15 213 lb 8 oz (96.843 kg)  04/11/15 215 lb (97.523 kg)    Physical Exam  Constitutional: She is oriented to person, place, and time. She appears well-developed and well-nourished. No distress.  HENT:  Head: Normocephalic and atraumatic.  Neck: No JVD present.  Cardiovascular: Normal rate, regular rhythm and normal heart sounds.   No murmur heard. Pulmonary/Chest: Effort normal and breath sounds normal. She has no wheezes. She has no rales.  Abdominal: Soft. There is no tenderness.    Musculoskeletal: She exhibits no edema.  R groin without hematoma or bruit    Neurological: She is alert and oriented to person, place, and time.  Skin: Skin is warm and dry.  Psychiatric: She has a normal mood and affect.    Studies/Labs Reviewed:    EKG:  EKG is  ordered today.  The ekg ordered today demonstrates NSR, HR 71, leftward axis, T-wave inversions in 1, aVL, QTc 441 ms, no change from prior tracing  Recent Labs: 01/01/2015: ALT 13* 09/18/2015: TSH 3.616 09/21/2015: BUN 18; Creatinine, Ser 1.05*; Hemoglobin 10.3*; Platelets 258; Potassium 4.3; Sodium 139   Recent Lipid Panel    Component Value Date/Time   CHOL 163 09/18/2015 0400   TRIG 155* 09/18/2015 0400   HDL 66 09/18/2015 0400   CHOLHDL 2.5 09/18/2015 0400   VLDL 31 09/18/2015 0400   LDLCALC 66 09/18/2015 0400    Additional studies/ records that were reviewed today include:   LHC 09/20/15  Severe three vessel CAD.  Patent SVG to OM.  Patent SVG to PDA. Patent stents in this graft.  Y graft with free LIMA to LAD and SVG to diagonal. LIMA portion is patent while vein graft to diagonal is occluded.  Elevated LVEDP 33 mm Hg. No change in coronary anatomy since October 2016. Patent stents in the SVG to PDA. Elevated LVEDP. Would diurese with Lasix to help reduce intravascular volume.  LHC 01/01/15  Severe native three vessel CAD.  Y graft. LIMA to LAD (patent); SVG to diagonal (occluded).  Patent SVG to OM.  SVG to RCA graft and recently placed stents are widely patent.  Normal LVEDP.  Echo 01/01/15 Moderate LVH, EF 55-60%, normal wall motion, MAC, mild LAE  LHC 12/19/14 LAD: Proximal 25%, distal 90% LCx: Diffuse 90% RCA: Mid 90% SVG-OM2 okay with luminal irregularities Y graft LIMA-LAD and SVG-diagonal: LIMA patent, SVG-diagonal occluded SVG-RPDA: 90% distal PCI:  3.5 x 28 mm and 3.5 x 8 mm Synergy DES to the SVG-RPDA  Severe native three vessel CAD.  Y graft with LIMA-LAD and SVG-Dx. LIMA  portion patent, vein graft to diagonal is occluded.  The SVG to OM is widely patent. The SVG to RCA has a 90% distal lesion.  The SVG-RCA lesion in the distal graft txwith overlapping DES, 3.5 x 28 and 3.5 x 8 mm Synergy stents. Continue dual antiplatelet therapy for at least a year. She'll be watched overnight. Will continue IV Angiomax for a few hours while the Brilinta is becoming therapeutic. Plan for discharge tomorrow. She will benefit from aggressive secondary prevention including cardiac rehabilitation, blood pressure control and weight loss. Left subclavian stenosis which prevents left radial access to the aorta   ASSESSMENT:    1. Coronary artery disease involving native coronary artery of native heart with angina pectoris ([place])   2. (HFpEF) heart failure with preserved ejection fraction ([place])   3. Essential hypertension   4. Hyperlipidemia LDL goal <70   5. Subclavian arterial stenosis (HCC)    PLAN:    In order of problems listed above:  1. CAD: S/p CABG.  She is status post DES to the [place] in 9/16.  She has had several admissions for [place] in the past. She had a recent admission with normal cardiac enzymes and cardiac catheterization demonstrating stable anatomy.   SVG-OM and SVG-PDA as well as the LIMA-LAD limb of the Y graft were all patent. SVG-diagonal limb of the Y graft remained occluded.  She continues medical therapy for microvascular angina. Overall, symptoms are stable.  -  Continue aspirin, Plavix, Ranexa, amlodipine, isosorbide, statin.   2. HFpEF - EF has been normal and recent LHC with elevated LVEDP.  Lasix was added to her medical regimen.  Breathing is overall improved. However, her blood pressure now is running low. She does feel tired. Check BMET today. I will change her Lasix to every Monday, Wednesday, Friday. She understands the importance of daily weights and when to call.  3. HTN: As noted blood pressure running low. Adjust Lasix as outlined.  4.  Hyperlipidemia - Continue statin. LDL in 6/17 was 66.  5. L Subclavian Stenosis: Noted on [facility] 12/19/14. She is unable to undergo cath from L radial access.     Medication Adjustments/Labs and Tests Ordered: Current medicines are reviewed at length with the patient today.  Concerns regarding medicines are outlined above.  Medication changes, Labs and Tests ordered today are outlined in the Patient Instructions noted below. Patient Instructions  Medication Instructions:  1. CHANGE LASIX TO EVERY MON,WED AND Friday's Labwork: BMET Testing/Procedures: NONE Follow-Up: Your physician wants you to follow-up in:  WITH DR. VARANASI 03/2016 You will receive a reminder letter in the mail two months in advance. If you don't receive a letter, please call our office to schedule the follow-up appointment. Any Other Special Instructions Will Be Listed Below (If Applicable). CALL IF YOUR WEIGHT IS INCREASED BY 3 LB'S IN 1 DAY OR 5 LB'S IN 1 WEEK; [phone] If you need a refill on your cardiac medications before your next appointment, please call your pharmacy.   Signed, Richardson Dopp, PA-C  10/08/2015 3:27 PM    Erin Ingram [address], [place], [place]  [address] Phone: [phone]; Fax: [phone]

## 2015-10-08 ENCOUNTER — Encounter: Payer: Self-pay | Admitting: Physician Assistant

## 2015-10-08 ENCOUNTER — Ambulatory Visit (INDEPENDENT_AMBULATORY_CARE_PROVIDER_SITE_OTHER): Payer: Medicare Other | Admitting: Physician Assistant

## 2015-10-08 VITALS — BP 102/30 | HR 70 | Ht <= 58 in | Wt 218.2 lb

## 2015-10-08 DIAGNOSIS — I5030 Unspecified diastolic (congestive) heart failure: Secondary | ICD-10-CM | POA: Diagnosis not present

## 2015-10-08 DIAGNOSIS — E785 Hyperlipidemia, unspecified: Secondary | ICD-10-CM | POA: Diagnosis not present

## 2015-10-08 DIAGNOSIS — I25119 Atherosclerotic heart disease of native coronary artery with unspecified angina pectoris: Secondary | ICD-10-CM | POA: Diagnosis not present

## 2015-10-08 DIAGNOSIS — I10 Essential (primary) hypertension: Secondary | ICD-10-CM | POA: Diagnosis not present

## 2015-10-08 DIAGNOSIS — I771 Stricture of artery: Secondary | ICD-10-CM

## 2015-10-08 DIAGNOSIS — I2510 Atherosclerotic heart disease of native coronary artery without angina pectoris: Secondary | ICD-10-CM | POA: Insufficient documentation

## 2015-10-08 MED ORDER — FUROSEMIDE 20 MG PO TABS: 20.0000 mg | ORAL_TABLET | ORAL | Status: DC

## 2015-10-08 NOTE — Patient Instructions (Addendum)
Medication Instructions:  1. CHANGE LASIX TO EVERY MON,WED AND Friday's Labwork: BMET Testing/Procedures: NONE Follow-Up: Your physician wants you to follow-up in:  WITH DR. VARANASI 03/2016 You will receive a reminder letter in the mail two months in advance. If you don't receive a letter, please call our office to schedule the follow-up appointment. Any Other Special Instructions Will Be Listed Below (If Applicable). CALL IF YOUR WEIGHT IS INCREASED BY 3 LB'S IN 1 DAY OR 5 LB'S IN 1 WEEK; [phone] If you need a refill on your cardiac medications before your next appointment, please call your pharmacy.

## 2015-10-09 ENCOUNTER — Telehealth: Payer: Self-pay

## 2015-10-09 DIAGNOSIS — Z79899 Other long term (current) drug therapy: Secondary | ICD-10-CM

## 2015-10-09 LAB — BASIC METABOLIC PANEL
BUN: 22 mg/dL (ref 7–25)
CO2: 25 mmol/L (ref 20–31)
Calcium: 8.8 mg/dL (ref 8.6–10.4)
Chloride: 104 mmol/L (ref 98–110)
Creat: 1.46 mg/dL — ABNORMAL HIGH (ref 0.60–0.93)
Glucose, Bld: 125 mg/dL — ABNORMAL HIGH (ref 65–99)
Potassium: 4 mmol/L (ref 3.5–5.3)
Sodium: 141 mmol/L (ref 135–146)

## 2015-10-09 NOTE — Telephone Encounter (Signed)
Called patient with lab results. Per Richardson Dopp PA, K+ ok, Creatinine increased some. Skip Friday's dose of Lasix, then resume Lasix dose as outlined at [place] yesterday. BMET 1 week. Patient verbalized understanding.  Patient will get a BMET in one week per Richardson Dopp PA.

## 2015-10-15 ENCOUNTER — Other Ambulatory Visit: Payer: Medicare Other

## 2015-10-21 ENCOUNTER — Other Ambulatory Visit: Payer: Self-pay | Admitting: Physician Assistant

## 2015-10-21 ENCOUNTER — Other Ambulatory Visit: Payer: Self-pay | Admitting: Interventional Cardiology

## 2015-10-21 DIAGNOSIS — I10 Essential (primary) hypertension: Secondary | ICD-10-CM

## 2015-10-22 ENCOUNTER — Other Ambulatory Visit: Payer: Self-pay

## 2015-10-22 ENCOUNTER — Other Ambulatory Visit: Payer: Self-pay | Admitting: Interventional Cardiology

## 2015-10-22 MED ORDER — ISOSORBIDE MONONITRATE ER 30 MG PO TB24: 30.0000 mg | ORAL_TABLET | Freq: Every day | ORAL | 0 refills | Status: DC

## 2015-10-22 MED ORDER — ISOSORBIDE MONONITRATE ER 30 MG PO TB24: 30.0000 mg | ORAL_TABLET | Freq: Every day | ORAL | 11 refills | Status: DC

## 2015-10-22 NOTE — Telephone Encounter (Signed)
Luz Lex, PA-C at 10/07/2015 10:21 PM    isosorbide mononitrate (IMDUR) 30 MG 24 hr tablet Take 1 tablet (30 mg total) by mouth at bedtime   1. CAD: S/p CABG.  She is status post DES to the [place] in 9/16.  She has had several admissions for [place] in the past. She had a recent admission with normal cardiac enzymes and cardiac catheterization demonstrating stable anatomy.   SVG-OM and SVG-PDA as well as the LIMA-LAD limb of the Y graft were all patent. SVG-diagonal limb of the Y graft remained occluded.  She continues medical therapy for microvascular angina. Overall, symptoms are stable.                       -  Continue aspirin, Plavix, Ranexa, amlodipine, isosorbide, statin.

## 2015-11-11 ENCOUNTER — Other Ambulatory Visit: Payer: Self-pay | Admitting: Interventional Cardiology

## 2015-12-31 ENCOUNTER — Telehealth: Payer: Self-pay | Admitting: Interventional Cardiology

## 2015-12-31 NOTE — Telephone Encounter (Signed)
Left message for patient to call back  

## 2015-12-31 NOTE — Telephone Encounter (Signed)
New Message  Pt c/o Shortness Of Breath: STAT if SOB developed within the last 24 hours or pt is noticeably SOB on the phone  1. Are you currently SOB (can you hear that pt is SOB on the phone)? Per pt yes   2. How long have you been experiencing SOB? Per pt for about 2 days   3. Are you SOB when sitting or when up moving around? Per pt its happens more when moving around. But has been more frequently when still  4. Are you currently experiencing any other symptoms? Per pt is tired often.   Pt would like to be seen tomorrow 10/11. I did offer her next available for care team on 11/12. Pt would like to speak with RN before scheduling. Please call back to discuss

## 2016-01-01 NOTE — Telephone Encounter (Signed)
I spoke with patient. She states she is better today. She states she was coughing up mucus yesterday and that is why she was having SOB, she admits to having "a cold". She states she does not need to be seen and is fine now. I advised her to call back with any other questions or issues. She voiced understanding.

## 2016-01-07 ENCOUNTER — Encounter (HOSPITAL_COMMUNITY): Payer: Self-pay

## 2016-01-07 ENCOUNTER — Emergency Department (HOSPITAL_COMMUNITY): Payer: Medicare Other

## 2016-01-07 ENCOUNTER — Emergency Department (HOSPITAL_COMMUNITY)
Admission: EM | Admit: 2016-01-07 | Discharge: 2016-01-07 | Disposition: A | Discharge status: Home / Self Care | Payer: Medicare Other | Attending: Emergency Medicine | Admitting: Emergency Medicine

## 2016-01-07 DIAGNOSIS — Z79899 Other long term (current) drug therapy: Secondary | ICD-10-CM | POA: Insufficient documentation

## 2016-01-07 DIAGNOSIS — Z951 Presence of aortocoronary bypass graft: Secondary | ICD-10-CM | POA: Diagnosis not present

## 2016-01-07 DIAGNOSIS — I10 Essential (primary) hypertension: Secondary | ICD-10-CM | POA: Diagnosis not present

## 2016-01-07 DIAGNOSIS — I25119 Atherosclerotic heart disease of native coronary artery with unspecified angina pectoris: Secondary | ICD-10-CM | POA: Diagnosis not present

## 2016-01-07 DIAGNOSIS — Z7982 Long term (current) use of aspirin: Secondary | ICD-10-CM | POA: Insufficient documentation

## 2016-01-07 DIAGNOSIS — G459 Transient cerebral ischemic attack, unspecified: Secondary | ICD-10-CM | POA: Diagnosis present

## 2016-01-07 DIAGNOSIS — Z87891 Personal history of nicotine dependence: Secondary | ICD-10-CM | POA: Diagnosis not present

## 2016-01-07 LAB — CBC WITH DIFFERENTIAL/PLATELET
Basophils Absolute: 0 10*3/uL (ref 0.0–0.1)
Basophils Relative: 0 %
Eosinophils Absolute: 0 10*3/uL (ref 0.0–0.7)
Eosinophils Relative: 0 %
HCT: 33.2 % — ABNORMAL LOW (ref 36.0–46.0)
Hemoglobin: 10.8 g/dL — ABNORMAL LOW (ref 12.0–15.0)
Lymphocytes Relative: 9 %
Lymphs Abs: 1.3 10*3/uL (ref 0.7–4.0)
MCH: 29.8 pg (ref 26.0–34.0)
MCHC: 32.5 g/dL (ref 30.0–36.0)
MCV: 91.7 fL (ref 78.0–100.0)
Monocytes Absolute: 0.4 10*3/uL (ref 0.1–1.0)
Monocytes Relative: 3 %
Neutro Abs: 12.1 10*3/uL — ABNORMAL HIGH (ref 1.7–7.7)
Neutrophils Relative %: 88 %
Platelets: 243 10*3/uL (ref 150–400)
RBC: 3.62 MIL/uL — ABNORMAL LOW (ref 3.87–5.11)
RDW: 14.8 % (ref 11.5–15.5)
WBC: 13.8 10*3/uL — ABNORMAL HIGH (ref 4.0–10.5)

## 2016-01-07 LAB — COMPREHENSIVE METABOLIC PANEL
ALT: 14 U/L (ref 14–54)
AST: 15 U/L (ref 15–41)
Albumin: 3.5 g/dL (ref 3.5–5.0)
Alkaline Phosphatase: 48 U/L (ref 38–126)
Anion gap: 11 (ref 5–15)
BUN: 24 mg/dL — ABNORMAL HIGH (ref 6–20)
CO2: 23 mmol/L (ref 22–32)
Calcium: 9 mg/dL (ref 8.9–10.3)
Chloride: 104 mmol/L (ref 101–111)
Creatinine, Ser: 1.26 mg/dL — ABNORMAL HIGH (ref 0.44–1.00)
GFR calc Af Amer: 47 mL/min — ABNORMAL LOW (ref >60)
GFR calc non Af Amer: 40 mL/min — ABNORMAL LOW (ref >60)
Glucose, Bld: 146 mg/dL — ABNORMAL HIGH (ref 65–99)
Potassium: 4.3 mmol/L (ref 3.5–5.1)
Sodium: 138 mmol/L (ref 135–145)
Total Bilirubin: 0.3 mg/dL (ref 0.3–1.2)
Total Protein: 6.5 g/dL (ref 6.5–8.1)

## 2016-01-07 LAB — URINALYSIS, ROUTINE W REFLEX MICROSCOPIC
Glucose, UA: NEGATIVE mg/dL
Hgb urine dipstick: NEGATIVE
Ketones, ur: NEGATIVE mg/dL
Nitrite: NEGATIVE
Protein, ur: 30 mg/dL — AB
Specific Gravity, Urine: 1.019 (ref 1.005–1.030)
pH: 5.5 (ref 5.0–8.0)

## 2016-01-07 LAB — I-STAT TROPONIN, ED
Troponin i, poc: 0 ng/mL (ref 0.00–0.08)
Troponin i, poc: 0.01 ng/mL (ref 0.00–0.08)
Troponin i, poc: 0.01 ng/mL (ref 0.00–0.08)

## 2016-01-07 LAB — PROTIME-INR
INR: 1
Prothrombin Time: 13.2 seconds (ref 11.4–15.2)

## 2016-01-07 LAB — URINE MICROSCOPIC-ADD ON
Bacteria, UA: NONE SEEN
RBC / HPF: NONE SEEN RBC/hpf (ref 0–5)

## 2016-01-07 MED ORDER — SODIUM CHLORIDE 0.9 % IV BOLUS (SEPSIS): 500.0000 mL | Freq: Once | INTRAVENOUS | Status: DC

## 2016-01-07 NOTE — ED Provider Notes (Signed)
[facility] DEPT Provider Note   CSN: [id number] Arrival date & time: 01/07/16  1708     History   Chief Complaint Chief Complaint  Patient presents with  . Transient Ischemic Attack    HPI Erin Ingram is a 75 y.o. female.  HPI  75 yo F with extensive PMHx including HFpEF, CAD, HTN, HLD, PMR who p/w transient episode of poor coordination. Pt states that earlier this afternoon, around 14:15, she was playing cards with friends. She started to feel a mild, frontal headache and "fuzzy feeling" followed by difficulty with shuffling her cards. She says her hands just would not "act right" but denies any numbness or weakness. No speech difficulty, dysphagia, vertigo, or other symptoms. Sx then completely resolved within 30 min to 1 hr and she is back to baseline. Has had similar sx in the past. Is on ASA/plavix and has been compliant with her meds. Denies any other complaints. No recent med changes,   Past Medical History:  Diagnosis Date  . (HFpEF) heart failure with preserved ejection fraction (HCC)    a. normal EF, LVEDP [facility] in 6/17 33 >> Lasix started   . Coronary artery disease    a. s/p CABG 2000 (SVG/ free LIMA Y graft to the diagonal and distal LAD, SVG to the OM 1, and SVG to the PDA)  // b. Cath 12/19/2014 90% dSVG to RCA s/p 2 overlapping DES (3.5 x 28 and 3.5 x 8 mm Synergy)  // c. LHC 10/16: stable anatomy, patent S-RCA stents  //  d. LHC 6/17:  L-LAD of Y graft patent, S-Dx of Y graft occluded, S-OM2 ok, S-RCA stents ok, LVEDP 32  . Depression    with anxious component  . Hyperlipidemia   . Obesity   . OSA on CPAP   . PMR (polymyalgia rheumatica) (HCC)   . Stable angina (HCC)    microvascular, improved with Ranexa    Patient Active Problem List   Diagnosis Date Noted  . (HFpEF) heart failure with preserved ejection fraction ([place]) 10/08/2015  . Coronary artery disease involving native coronary artery of native heart with angina pectoris ([place]) 10/08/2015  .  Chest pain at rest   . Subclavian arterial stenosis ([place])   . Dyslipidemia   . Hyperlipidemia LDL goal <70   . PAD (peripheral artery disease) ([place]) 04/11/2015  . Essential hypertension 04/11/2014  . Morbid obesity ([place]) 03/22/2013  . Anemia, unspecified 03/22/2013  . Depressive disorder, not elsewhere classified 03/22/2013  . Generalized osteoarthrosis, unspecified site 03/22/2013  . Unspecified sleep apnea 03/22/2013  . Impaired fasting glucose 03/22/2013  . 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 Encounter for long-term (current) use of other medications 03/22/2013    Past Surgical History:  Procedure Laterality Date  . BREAST BIOPSY  1964  . CARDIAC CATHETERIZATION  08   patent grafts, no culprit lesions, EF 65%  . CARDIAC CATHETERIZATION N/A 12/19/2014   Procedure: Left Heart Cath and Cors/Grafts Angiography;  Surgeon: Jettie Booze, MD;  Location: [facility];  Service: Cardiovascular;  Laterality: N/A;  . CARDIAC CATHETERIZATION N/A 12/19/2014   Procedure: Coronary Stent Intervention;  Surgeon: Jettie Booze, MD;  Location: [facility];  Service: Cardiovascular;  Laterality: N/A;  . CARDIAC CATHETERIZATION N/A 01/01/2015   Procedure: Left Heart Cath and Cors/Grafts Angiography;  Surgeon: Jettie Booze, MD;  Location: [facility];  Service: Cardiovascular;  Laterality: N/A;  . CARDIAC CATHETERIZATION N/A 09/20/2015   Procedure:  Left Heart Cath and Cors/Grafts Angiography;  Surgeon: Jettie Booze, MD;  Location: [facility];  Service: Cardiovascular;  Laterality: N/A;  . CATARACT EXTRACTION Bilateral 2011  . CORONARY ARTERY BYPASS GRAFT  2000   ASCVD, multivessel, S./P.  . REPAIR OF LEFT PATELLAR  1993    OB History    No data available       Home Medications    Prior to Admission medications   Medication Sig Start Date End Date Taking? Authorizing Provider  amLODipine (NORVASC) 5 MG tablet TAKE 1 TABLET ONCE DAILY.  11/12/15   Jettie Booze, MD  aspirin 81 MG chewable tablet Chew 1 tablet (81 mg total) by mouth daily. 12/20/14   Almyra Deforest, PA  atorvastatin (LIPITOR) 10 MG tablet Take 10 mg by mouth daily.    Historical Provider, MD  Calcium Glycerophosphate (PRELIEF PO) Take 1 tablet by mouth daily.     Historical Provider, MD  Cholecalciferol (VITAMIN D PO) Take 1 tablet by mouth daily.    Historical Provider, MD  Cinnamon 500 MG TABS Take 1 each by mouth daily.    Historical Provider, MD  clopidogrel (PLAVIX) 75 MG tablet TAKE 1 TABLET ONCE DAILY. 09/17/15   Jettie Booze, MD  Coenzyme Q10 10 MG capsule Take 10 mg by mouth daily.    Historical Provider, MD  fluocinonide cream (LIDEX) [id number] % Apply 1 application topically 2 (two) times daily as needed (rash).     Historical Provider, MD  fluticasone (FLONASE) 50 MCG/ACT nasal spray Place 2 sprays into both nostrils daily as needed for allergies.     Historical Provider, MD  furosemide (LASIX) 20 MG tablet Take 1 tablet (20 mg total) by mouth daily. 10/21/15   Liliane Shi, PA-C  isosorbide mononitrate (IMDUR) 30 MG 24 hr tablet Take 1 tablet (30 mg total) by mouth at bedtime. 10/22/15   Jettie Booze, MD  isosorbide mononitrate (IMDUR) 60 MG 24 hr tablet Take 2 tablets (120 mg total) by mouth daily. 10/21/15   Liliane Shi, PA-C  levocetirizine (XYZAL) 5 MG tablet Take 5 mg by mouth every evening.    Historical Provider, MD  levothyroxine (SYNTHROID, LEVOTHROID) 75 MCG tablet Take 75 mcg by mouth every evening.     Historical Provider, MD  Melatonin 1 MG TABS Take 5 mg by mouth at bedtime as needed (SLEEP). Reported on 03/22/2015    Historical Provider, MD  metoprolol succinate (TOPROL-XL) 100 MG 24 hr tablet Take 1 tablet by mouth once a day 08/26/15   Jettie Booze, MD  nitroGLYCERIN (NITROSTAT) 0.4 MG SL tablet Place 1 tablet (0.4 mg total) under the tongue every 5 (five) minutes as needed for chest pain. 12/20/14   Almyra Deforest, PA  Omega-3  Fatty Acids (FISH OIL PO) Take 1 tablet by mouth daily.     Historical Provider, MD  predniSONE (DELTASONE) 1 MG tablet Take 10 mg by mouth daily with breakfast.     Historical Provider, MD  Psyllium 400 MG CAPS Take 400 mg by mouth daily.    Historical Provider, MD  RANEXA 1000 MG SR tablet Take 1 tablet by mouth  twice a day 08/20/15   Jettie Booze, MD  venlafaxine XR (EFFEXOR-XR) 75 MG 24 hr capsule Take 225 mg by mouth daily with breakfast. Patient takes 3 capsules    Historical Provider, MD    Family History Family History  Problem Relation Age of Onset  . Heart disease  Mother   . Heart attack Mother   . Heart disease Father   . Heart attack Father   . Diabetes Brother   . Stroke Paternal Grandmother   . Hypertension Neg Hx     Social History Social History  Substance Use Topics  . Smoking status: Former Smoker    Quit date: 08/21/1969  . Smokeless tobacco: Never Used  . Alcohol use 0.0 oz/week     Comment: Glass of wine 2-3 x year     Allergies   Stadol [butorphanol]; Sulfa antibiotics; Bisoprolol fumarate; Butorphanol tartrate; Codeine; Demerol [meperidine]; Imipramine; Statins; Tequin [gatifloxacin]; Brilinta [ticagrelor]; Pseudoephedrine hcl; Septra [sulfamethoxazole-trimethoprim]; and Sulfamethoxazole-trimethoprim   Review of Systems Review of Systems  Constitutional: Negative for chills and fever.  HENT: Negative for congestion, rhinorrhea and sore throat.   Eyes: Negative for visual disturbance.  Respiratory: Negative for cough, shortness of breath and wheezing.   Cardiovascular: Negative for chest pain and leg swelling.  Gastrointestinal: Negative for abdominal pain, diarrhea, nausea and vomiting.  Genitourinary: Negative for dysuria, flank pain, vaginal bleeding and vaginal discharge.  Musculoskeletal: Negative for neck pain.  Skin: Negative for rash.  Allergic/Immunologic: Negative for immunocompromised state.  Neurological: Negative for syncope and  headaches.  Hematological: Does not bruise/bleed easily.  All other systems reviewed and are negative.    Physical Exam Updated Vital Signs BP (!) 127/53   Pulse 69   Temp 98 F (36.7 C) (Oral)   Resp 17   Ht 4\' 10"  (1.473 m)   Wt 218 lb (98.9 kg)   SpO2 98%   BMI 45.56 kg/m   Physical Exam  Constitutional: She is oriented to person, place, and time. She appears well-developed and well-nourished. No distress.  HENT:  Head: Normocephalic and atraumatic.  Eyes: Conjunctivae are normal.  Neck: Neck supple.  Cardiovascular: Normal rate, regular rhythm and normal heart sounds.  Exam reveals no friction rub.   No murmur heard. Pulmonary/Chest: Effort normal and breath sounds normal. No respiratory distress. She has no wheezes. She has no rales.  Abdominal: She exhibits no distension.  Musculoskeletal: She exhibits no edema.  Neurological: She is alert and oriented to person, place, and time. She exhibits normal muscle tone.  Skin: Skin is warm. Capillary refill takes less than 2 seconds.  Psychiatric: She has a normal mood and affect.  Nursing note and vitals reviewed.   Neurological Exam:  Mental Status: Alert and oriented to person, place, and time. Attention and concentration normal. Speech clear. Recent memory is intact. Cranial Nerves: Visual fields intact to confrontation in all quadrants bilaterally. EOMI and PERRLA. No nystagmus noted. Facial sensation intact at forehead, maxillary cheek, and chin/mandible bilaterally. No weakness of masticatory muscles. No facial asymmetry or weakness. Hearing grossly normal to finer rub. Uvula is midline, and palate elevates symmetrically. Normal SCM and trapezius strength. Tongue midline without fasciculations Motor: Muscle strength 5/5 in proximal and distal UE and LE bilaterally. No pronator drift. Muscle tone normal. Reflexes: 2+ and symmetrical in all four extremities.  Sensation: Intact to light touch in upper and lower extremities  distally bilaterally.  Gait: Normal without ataxia. Coordination: Normal FTN bilaterally.    ED Treatments / Results  Labs (all labs ordered are listed, but only abnormal results are displayed) Labs Reviewed  CBC WITH DIFFERENTIAL/PLATELET - Abnormal; Notable for the following:       Result Value   WBC 13.8 (*)    RBC 3.62 (*)    Hemoglobin 10.8 (*)  HCT 33.2 (*)    Neutro Abs 12.1 (*)    All other components within normal limits  COMPREHENSIVE METABOLIC PANEL - Abnormal; Notable for the following:    Glucose, Bld 146 (*)    BUN 24 (*)    Creatinine, Ser 1.26 (*)    GFR calc non Af Amer 40 (*)    GFR calc Af Amer 47 (*)    All other components within normal limits  URINALYSIS, ROUTINE W REFLEX MICROSCOPIC (NOT AT [facility]) - Abnormal; Notable for the following:    Color, Urine RED (*)    Bilirubin Urine SMALL (*)    Protein, ur 30 (*)    Leukocytes, UA SMALL (*)    All other components within normal limits  URINE MICROSCOPIC-ADD ON - Abnormal; Notable for the following:    Squamous Epithelial / LPF 0-5 (*)    All other components within normal limits  PROTIME-INR  I-STAT TROPOININ, ED  I-STAT TROPOININ, ED  I-STAT TROPOININ, ED    EKG  EKG Interpretation  Date/Time:  Tuesday January 07 2016 17:28:29 EDT Ventricular Rate:  67 PR Interval:    QRS Duration: 86 QT Interval:  442 QTC Calculation: 467 R Axis:   -11 Text Interpretation:  Sinus rhythm Low voltage, precordial leads Abnormal R-wave progression, early transition No significant change since last tracing Confirmed by Lasheba Stevens MD, Lysbeth Galas [phone]) on 01/07/2016 5:34:40 PM Also confirmed by Ellender Hose MD, Lysbeth Galas [phone]), editor [place]  CCT, [place] ([id number])  on 01/08/2016 7:22:13 AM       Radiology Ct Head Wo Contrast  Result Date: 01/07/2016 CLINICAL DATA:  TIA, developed fuzzy head, and loss of motor functions while at a friend's house, symptoms resolved since EXAM: CT HEAD WITHOUT CONTRAST TECHNIQUE:  Contiguous axial images were obtained from the base of the skull through the vertex without intravenous contrast. COMPARISON:  None FINDINGS: Brain: Generalized atrophy. Normal ventricular morphology. No midline shift or mass effect. Small vessel chronic ischemic changes of deep cerebral white matter. No intracranial hemorrhage, mass lesion, evidence of acute infarction, or extra-axial fluid collection. Vascular: Atherosclerotic calcification of internal carotid and vertebral arteries at skullbase. Skull: Unremarkable Sinuses/Orbits: Unremarkable Other: N/A IMPRESSION: Atrophy with small vessel chronic ischemic changes of deep cerebral white matter. No acute intracranial abnormalities. Electronically Signed   By: Lavonia Dana M.D.   On: 01/07/2016 18:25   Mr Brain Wo Contrast  Result Date: 01/07/2016 CLINICAL DATA:  Motor deficit.  Suspected TIA. EXAM: MRI HEAD WITHOUT CONTRAST TECHNIQUE: Multiplanar, multiecho pulse sequences of the brain and surrounding structures were obtained without intravenous contrast. COMPARISON:  Head CT 01/07/2016 FINDINGS: Brain: No acute infarct or intraparenchymal hemorrhage. The midline structures are normal. There is beginning confluent hyperintense T2-weighted signal within the periventricular and deep white matter, most often seen in the setting of chronic microvascular ischemia. No mass lesion or midline shift. No hydrocephalus or extra-axial fluid collection. Vascular: Major intracranial arterial and venous sinus flow voids are preserved. No evidence of chronic microhemorrhage or amyloid angiopathy. Skull and upper cervical spine: The visualized skull base, calvarium, upper cervical spine and extracranial soft tissues are normal. Sinuses/Orbits: No fluid levels or advanced mucosal thickening. No mastoid effusion. Normal orbits. IMPRESSION: 1. No acute intracranial abnormality. 2. Findings of chronic microvascular ischemia and mild volume loss. Electronically Signed   By: Ulyses Jarred M.D.   On: 01/07/2016 21:03    Procedures Procedures (including critical care time)  Medications Ordered in ED Medications - No data to display  Initial Impression / Assessment and Plan / ED Course  I have reviewed the triage vital signs and the nursing notes.  Pertinent labs & imaging results that were available during my care of the patient were reviewed by me and considered in my medical decision making (see chart for details).  Clinical Course    75 year old female with past medical history as above who presents with transient episode of discoordination. Now resolved. On arrival, vital signs are stable and within normal limits. Her neurological exam is nonfocal. CT head is negative. Labwork is at baseline. She has a mild leukocytosis which is not abnormal for her. Troponins are negative 2 and EKG is nonischemic. Primary concern is possible TIA. CT head is negative. I discussed the case with Dr. Jaynee Eagles. Given that patient is artery medically optimized on aspirin and Plavix, she recommends MRI and discharge if negative. She does not feel patient requires admission which is reasonable as she is already medically optimized, symptoms were mild and atypical, and transient. The episode also does not seem related to syncope and I do not suspect arrhythmia, near syncopal episode, hypotension, or other etiology. Patient is in agreement and would like to go home if MRI is negative.  MRI is negative. Patient's symptoms remained resolved. She is at her neurological baseline with serial normal neurological exams. Given discussion with neurology, will discharge with continued aspirin, Plavix, and outpatient neurology follow-up.  Final Clinical Impressions(s) / ED Diagnoses   Final diagnoses:  Transient cerebral ischemia, unspecified type    New Prescriptions Discharge Medication List as of 01/07/2016 10:37 PM       Duffy Bruce, MD 01/08/16 1700

## 2016-01-07 NOTE — ED Notes (Signed)
Pt transported to MRI 

## 2016-01-07 NOTE — ED Triage Notes (Signed)
Per EMS, at 1415 pt was in front lobby at friends home playing cards and was shuffling cards which is a task she normally can do and she had sudden onset of feeling "fuzzy headed" and when that happened she lost complete motor function of both hands and was unable to shuffle cards. Denies vision changed, slurred speech, or aphasia, denied weakness. Sx resolved at this time. No neuro deficits upon arrival with EMS or to the ED. VS 136/84, 68 HR NSR, 97% RA, CBG 191, RR 16. No hx of tia.

## 2016-01-07 NOTE — ED Notes (Addendum)
Pt is back from MR. Ambulated pt to restroom. Connected pt back to monitor. Gave pt meal bag & soda.

## 2016-01-21 ENCOUNTER — Other Ambulatory Visit: Payer: Self-pay | Admitting: Family Medicine

## 2016-01-21 DIAGNOSIS — G459 Transient cerebral ischemic attack, unspecified: Secondary | ICD-10-CM

## 2016-01-23 ENCOUNTER — Encounter: Payer: Self-pay | Admitting: Neurology

## 2016-01-23 ENCOUNTER — Ambulatory Visit (INDEPENDENT_AMBULATORY_CARE_PROVIDER_SITE_OTHER): Payer: Medicare Other | Admitting: Neurology

## 2016-01-23 VITALS — BP 115/71 | HR 67 | Ht <= 58 in | Wt 219.0 lb

## 2016-01-23 DIAGNOSIS — I5030 Unspecified diastolic (congestive) heart failure: Secondary | ICD-10-CM | POA: Diagnosis not present

## 2016-01-23 DIAGNOSIS — I25119 Atherosclerotic heart disease of native coronary artery with unspecified angina pectoris: Secondary | ICD-10-CM

## 2016-01-23 NOTE — Progress Notes (Signed)
PATIENT: Erin Ingram DOB: Dec 13, 1940  Chief Complaint  Patient presents with  . Transient Ischemic Attack    Reports having an episode, while playing cards on 01/07/16, of blurred vision and a loss of coordination in her hands.  She went to ED for evaluation and would like to review her CT and MRI scans.  Her symptoms lasted between 30 - 60 minutes.  She has a bilateral carotid US scheduled for 02/07/16.  She takes aspirin 81mg  and Plavix 75mg  daily.  Marland Kitchen PCP    Hulan Fess, MD     HISTORICAL  Erin Ingram is a 75 year old right-handed female, seen in refer by her primary care physician Dr. Hulan Fess for evaluation of possible TIA, initial evaluation was January 23 2016,  She and her husband lives at friend[facility] home independent living, had a history of coronary artery disease, hypertension, hyperlipidemia, multivessel CABG in March 2000, obstructive sleep apnea, on CPAP machine, depression with anxiety, polymyalgia rheumatica was diagnosed in December 2014, hypothyroidism, chronic kidney disease, anemia of chronic kidney disease, allergic to multiple medications, including statins cause muscle achy pain, she use walker intermittently due to leg pain and weakness. For polymyalgia rheumatica, she has been treated with low-dose prednisone 10 mg for many years, once she is tapering down the medication she had recurrent symptoms.  She presented to the emergency room January 07 2016, it was her routine to play cards with her friend every Tuesday afternoon, around 2:15, after lunch, she was sitting playing with her friend, she noticed mild frontal headaches, dark vision in the center of her visual field, with lattice bright light shining through, she noticed mild difficulty shuffling her card, symptoms last about 30 minutes, she denies confusion, she was able to finish playing card, won the game. She was later taken to the emergency room for evaluation, she is already on aspirin and  Plavix  I personally reviewed MRI of the brain without contrast, generalized atrophy, mild periventricular small vessel disease, no acute abnormalities,    Echocardiogram in October 2016: Left ventricle:  The cavity size was normal. There was moderate concentric hypertrophy. Systolic function was normal. The estimated ejection fraction was in the range of 55% to 60%. Wall motion was Normal; Cardio catheter in June 2017, severe three-vessel coronary artery disease, elevated left ventricular diastolic pressure 33 mmHg, she was given Lasix to help reduce intravascular volume  Labs: negative troponin times 3, CMP showed elevated creatinine 1.26, glucose 146,CBC showed hemoglobin 10 point 8, June 2017 normal TSH, A1c 5.6, total cholesterol 163, LDL 66,  Ultrasound of carotid artery is pending January 28 2016  REVIEW OF SYSTEMS: Full 14 system review of systems performed and notable only for weight gain, fatigue, memory loss, depression, taking Effexor 75 mg 3 tablets every morning  ALLERGIES: Allergies  Allergen Reactions  . Stadol [Butorphanol] Other (See Comments)    agitation Constipation  . Sulfa Antibiotics Rash  . Bisoprolol Fumarate Other (See Comments)    Doesn't remember  . Butorphanol Tartrate Other (See Comments)    Produced a lot of urine, made patient feel crazy  . Codeine Nausea And Vomiting  . Demerol [Meperidine] Nausea And Vomiting  . Imipramine     Sweating, facial dysfunction   . Statins     MYALGIAS  . Tequin [Gatifloxacin] Other (See Comments)    Caused hypoglycemia Low blood sugar  . Brilinta [Ticagrelor] Rash    CAUSES PETECHIAE, PURPURA  . Pseudoephedrine Hcl Palpitations  . Septra [  Sulfamethoxazole-Trimethoprim] Rash  . Sulfamethoxazole-Trimethoprim Rash    HOME MEDICATIONS: Current Outpatient Prescriptions  Medication Sig Dispense Refill  . amLODipine (NORVASC) 5 MG tablet TAKE 1 TABLET ONCE DAILY. 30 tablet 10  . aspirin 81 MG chewable tablet Chew  1 tablet (81 mg total) by mouth daily.    Marland Kitchen atorvastatin (LIPITOR) 10 MG tablet Take 10 mg by mouth daily.    . Calcium Glycerophosphate (PRELIEF PO) Take 1 tablet by mouth daily.     . Cholecalciferol (VITAMIN D PO) Take 1 tablet by mouth daily.    . Cinnamon 500 MG TABS Take 1 each by mouth daily.    . clopidogrel (PLAVIX) 75 MG tablet TAKE 1 TABLET ONCE DAILY. 90 tablet 2  . Coenzyme Q10 10 MG capsule Take 10 mg by mouth daily.    . fluocinonide cream (LIDEX) [id number] % Apply 1 application topically 2 (two) times daily as needed (rash).     . fluticasone (FLONASE) 50 MCG/ACT nasal spray Place 2 sprays into both nostrils daily as needed for allergies.     . furosemide (LASIX) 20 MG tablet Take 1 tablet (20 mg total) by mouth daily. 90 tablet 3  . isosorbide mononitrate (IMDUR) 30 MG 24 hr tablet Take 1 tablet (30 mg total) by mouth at bedtime. 30 tablet 11  . isosorbide mononitrate (IMDUR) 60 MG 24 hr tablet Take 2 tablets (120 mg total) by mouth daily. 180 tablet 3  . levocetirizine (XYZAL) 5 MG tablet Take 5 mg by mouth every evening.    Marland Kitchen levothyroxine (SYNTHROID, LEVOTHROID) 75 MCG tablet Take 75 mcg by mouth every evening.     . Melatonin 1 MG TABS Take 5 mg by mouth at bedtime as needed (SLEEP). Reported on 03/22/2015    . metoprolol succinate (TOPROL-XL) 100 MG 24 hr tablet Take 1 tablet by mouth once a day 90 tablet 1  . nitroGLYCERIN (NITROSTAT) 0.4 MG SL tablet Place 1 tablet (0.4 mg total) under the tongue every 5 (five) minutes as needed for chest pain. 25 tablet 3  . Omega-3 Fatty Acids (FISH OIL PO) Take 1 tablet by mouth daily.     . predniSONE (DELTASONE) 1 MG tablet Take 10 mg by mouth daily with breakfast.     . Psyllium 400 MG CAPS Take 400 mg by mouth daily.    Marland Kitchen RANEXA 1000 MG SR tablet Take 1 tablet by mouth  twice a day 180 tablet 2  . venlafaxine XR (EFFEXOR-XR) 75 MG 24 hr capsule Take 225 mg by mouth daily with breakfast. Patient takes 3 capsules     No current  facility-administered medications for this visit.     PAST MEDICAL HISTORY: Past Medical History:  Diagnosis Date  . (HFpEF) heart failure with preserved ejection fraction (HCC)    a. normal EF, LVEDP at [facility] in 6/17 33 >> Lasix started   . Coronary artery disease    a. s/p CABG 2000 (SVG/ free LIMA Y graft to the diagonal and distal LAD, SVG to the OM 1, and SVG to the PDA)  // b. Cath 12/19/2014 90% dSVG to RCA s/p 2 overlapping DES (3.5 x 28 and 3.5 x 8 mm Synergy)  // c. LHC 10/16: stable anatomy, patent S-RCA stents  //  d. LHC 6/17:  L-LAD of Y graft patent, S-Dx of Y graft occluded, S-OM2 ok, S-RCA stents ok, LVEDP 32  . Depression    with anxious component  . Hyperlipidemia   . Obesity   .  OSA on CPAP   . PMR (polymyalgia rheumatica) (HCC)   . Stable angina (HCC)    microvascular, improved with Ranexa  . TIA (transient ischemic attack)     PAST SURGICAL HISTORY: Past Surgical History:  Procedure Laterality Date  . BREAST BIOPSY  1964  . CARDIAC CATHETERIZATION  08   patent grafts, no culprit lesions, EF 65%  . CARDIAC CATHETERIZATION N/A 12/19/2014   Procedure: Left Heart Cath and Cors/Grafts Angiography;  Surgeon: Jettie Booze, MD;  Location: [place] [facility];  Service: Cardiovascular;  Laterality: N/A;  . CARDIAC CATHETERIZATION N/A 12/19/2014   Procedure: Coronary Stent Intervention;  Surgeon: Jettie Booze, MD;  Location: [place] [facility];  Service: Cardiovascular;  Laterality: N/A;  . CARDIAC CATHETERIZATION N/A 01/01/2015   Procedure: Left Heart Cath and Cors/Grafts Angiography;  Surgeon: Jettie Booze, MD;  Location: [place] [facility];  Service: Cardiovascular;  Laterality: N/A;  . CARDIAC CATHETERIZATION N/A 09/20/2015   Procedure: Left Heart Cath and Cors/Grafts Angiography;  Surgeon: Jettie Booze, MD;  Location: [place] [facility];  Service: Cardiovascular;  Laterality: N/A;  . CATARACT EXTRACTION Bilateral 2011  . CORONARY ARTERY BYPASS  GRAFT  2000   ASCVD, multivessel, S./P.  . REPAIR OF LEFT PATELLAR  1993    FAMILY HISTORY: Family History  Problem Relation Age of Onset  . Heart disease Mother   . Heart attack Mother   . Heart disease Father   . Heart attack Father   . Diabetes Brother   . Stroke Paternal Grandmother   . Hypertension Neg Hx     SOCIAL HISTORY:  Social History   Social History  . Marital status: Married    Spouse name: N/A  . Number of children: 2  . Years of education: College   Occupational History  . Retired Cytogeneticist    Social History Main Topics  . Smoking status: Former Smoker    Quit date: 08/21/1969  . Smokeless tobacco: Never Used  . Alcohol use 0.0 oz/week     Comment: Glass of wine 2-3 x year  . Drug use: No  . Sexual activity: Not on file   Other Topics Concern  . Not on file   Social History Narrative   Lives in [place] with husband.    Right-handed.   1 cup caffeine per day.     PHYSICAL EXAM   Vitals:   01/23/16 1347  BP: 115/71  Pulse: 67  Weight: 219 lb (99.3 kg)  Height: 4\' 10"  (1.473 m)    Not recorded      Body mass index is 45.77 kg/m.  PHYSICAL EXAMNIATION:  Gen: NAD, conversant, well nourised, obese, well groomed                     Cardiovascular: Regular rate rhythm, no peripheral edema, warm, nontender. Eyes: Conjunctivae clear without exudates or hemorrhage Neck: Supple, no carotid bruits. Pulmonary: Clear to auscultation bilaterally   NEUROLOGICAL EXAM:  MENTAL STATUS: Speech:    Speech is normal; fluent and spontaneous with normal comprehension.  Cognition:     Orientation to time, place and person     Normal recent and remote memory     Normal Attention span and concentration     Normal Language, naming, repeating,spontaneous speech     Fund of knowledge   CRANIAL NERVES: CN II: Visual fields are full to confrontation. Fundoscopic exam is normal with sharp discs and no  vascular changes. Pupils are round  equal and briskly reactive to light. CN III, IV, VI: extraocular movement are normal. No ptosis. CN V: Facial sensation is intact to pinprick in all 3 divisions bilaterally. Corneal responses are intact.  CN VII: Face is symmetric with normal eye closure and smile. CN VIII: Hearing is normal to rubbing fingers CN IX, X: Palate elevates symmetrically. Phonation is normal. CN XI: Head turning and shoulder shrug are intact CN XII: Tongue is midline with normal movements and no atrophy.  MOTOR: There is no pronator drift of out-stretched arms. Muscle bulk and tone are normal. Muscle strength is normal.  REFLEXES: Reflexes are 2+ and symmetric at the biceps, triceps, knees, and ankles. Plantar responses are flexor.  SENSORY: Intact to light touch, pinprick, positional sensation and vibratory sensation are intact in fingers and toes.  COORDINATION: Rapid alternating movements and fine finger movements are intact. There is no dysmetria on finger-to-nose and heel-knee-shin.    GAIT/STANCE: She has obesity, right plantar fasciitis, rely on her walker, mildly unsteady, antalgic gait   DIAGNOSTIC DATA (LABS, IMAGING, TESTING) - I reviewed patient records, labs, notes, testing and imaging myself where available.   ASSESSMENT AND PLAN  Erin Ingram is a 75 y.o. female   One episode of mild slurred speech on January 07 2016, It happened in the setting of after lunch, prolonged sitting, she has history of obesity, obstructive sleep apnea, polypharmacy treatment,  Potential explanation would be early afternoon sleepiness, versus medicine side effect, remote possibility of TIA, She had extensive evaluations detailed above, no significant large vessel disease, multiple vascular risk factors, including obesity, sedentary lifestyle, hypertension, obstructive sleep apnea, coronary artery disease, evidence of previous small vessel disease Continue aspirin and Plavix, Keep well hydration, May  consider structured daytime nap   Marcial Pacas, M.D. Ph.D.  [facility] [address], [place], [place] [address] Ph: [phone] Fax: [phone]  CC: Hulan Fess, MD

## 2016-01-28 ENCOUNTER — Ambulatory Visit
Admission: RE | Admit: 2016-01-28 | Discharge: 2016-01-28 | Disposition: A | Discharge status: Home / Self Care | Payer: Medicare Other | Source: Ambulatory Visit | Attending: Family Medicine | Admitting: Family Medicine

## 2016-01-28 DIAGNOSIS — G459 Transient cerebral ischemic attack, unspecified: Secondary | ICD-10-CM

## 2016-02-12 ENCOUNTER — Encounter: Payer: Self-pay | Admitting: Podiatry

## 2016-02-12 ENCOUNTER — Ambulatory Visit (INDEPENDENT_AMBULATORY_CARE_PROVIDER_SITE_OTHER): Payer: Medicare Other | Admitting: Podiatry

## 2016-02-12 ENCOUNTER — Ambulatory Visit (INDEPENDENT_AMBULATORY_CARE_PROVIDER_SITE_OTHER): Payer: Medicare Other

## 2016-02-12 VITALS — BP 121/81 | HR 85 | Resp 18

## 2016-02-12 DIAGNOSIS — M79671 Pain in right foot: Secondary | ICD-10-CM

## 2016-02-12 DIAGNOSIS — M722 Plantar fascial fibromatosis: Secondary | ICD-10-CM

## 2016-02-12 NOTE — Progress Notes (Signed)
   Subjective:    Patient ID: Erin Ingram, female    DOB: 13-Apr-1940, 75 y.o.   MRN: [id number]  HPI    Review of Systems  Constitutional: Positive for fatigue.  HENT: Positive for hearing loss and tinnitus.   Eyes: Positive for itching.  Respiratory: Positive for apnea, chest tightness and shortness of breath.   Gastrointestinal: Positive for abdominal distention.  Musculoskeletal: Positive for arthralgias and gait problem.  Allergic/Immunologic: Positive for environmental allergies.  Psychiatric/Behavioral: The patient is nervous/anxious.   All other systems reviewed and are negative.      Objective:   Physical Exam        Assessment & Plan:

## 2016-02-20 ENCOUNTER — Other Ambulatory Visit: Payer: Self-pay | Admitting: Interventional Cardiology

## 2016-02-26 ENCOUNTER — Other Ambulatory Visit: Payer: Self-pay | Admitting: Interventional Cardiology

## 2016-02-27 ENCOUNTER — Telehealth: Payer: Self-pay | Admitting: *Deleted

## 2016-02-27 NOTE — Telephone Encounter (Addendum)
Pt states she saw Dr. Amalia Hailey before Thanksgiving and was to get a cream from [facility]. 02/28/2016-Left message informing pt of the delay and gave [facility] [phone], for identification and explained their steps to getting rx to pt.Dr. Amalia Hailey ordered achilles tendonitis cream from [facility]. Orders faxed to [facility].

## 2016-02-28 MED ORDER — NONFORMULARY OR COMPOUNDED ITEM: 2 refills | Status: DC

## 2016-02-28 NOTE — Telephone Encounter (Signed)
AAAhhhh yes I know! I will be 100% caught up on dictations this weekend! Haha.  Yes, she is to receive the achilles tendinitis cream.   Anytime someone calls about [facility] cream, it will be either achilles tendinitis cream or neuropathy cream. Just ask the patient if the cream is for neuropathy or pain and feel free to prescribe them whichever is appropriate.   Thanks, Dr. Amalia Hailey

## 2016-03-01 NOTE — Progress Notes (Signed)
Patient ID: Erin Ingram, female   DOB: [birth year], 75 y.o.   MRN: [id number] Subjective: Patient presents today for pain and tenderness in the right foot. Patient states the foot pain has been hurting for several weeks now. Patient states that it hurts in the mornings with the first steps out of bed. Patient presents today for further treatment and evaluation  Objective: Physical Exam General: The patient is alert and oriented x3 in no acute distress.  Dermatology: Skin is warm, dry and supple bilateral lower extremities. Negative for open lesions or macerations bilateral.   Vascular: Dorsalis Pedis and Posterior Tibial pulses palpable bilateral.  Capillary fill time is immediate to all digits.  Neurological: Epicritic and protective threshold intact bilateral.   Musculoskeletal: Tenderness to palpation at the medial calcaneal tubercale and through the insertion of the plantar fascia of the right foot. All other joints range of motion within normal limits bilateral. Strength 5/5 in all groups bilateral.   Radiographic exam: Normal osseous mineralization. Joint spaces preserved. No fracture/dislocation/boney destruction. Calcaneal spur present with mild thickening of plantar fascia right. No other soft tissue abnormalities or radiopaque foreign bodies.   Assessment: 1. Plantar fasciitis right 2. Pain in right foot 3. History of kidney stones  Plan of Care:  1. Patient evaluated. Xrays reviewed.   2. Plantar fascial band dispensed 3. Prescription for anti-inflammatory pain cream through [facility] 4. Patient cannot take oral NSAIDS due to a history of kidney stone 5. Today we discussed etiology of plantar fasciitis as well as the conservative modalities to alleviate symptoms at home 6. Return to clinic in 4 weeks

## 2016-03-11 ENCOUNTER — Encounter: Payer: Self-pay | Admitting: Podiatry

## 2016-03-11 ENCOUNTER — Ambulatory Visit (INDEPENDENT_AMBULATORY_CARE_PROVIDER_SITE_OTHER): Payer: Medicare Other | Admitting: Podiatry

## 2016-03-11 DIAGNOSIS — M722 Plantar fascial fibromatosis: Secondary | ICD-10-CM | POA: Diagnosis not present

## 2016-03-11 DIAGNOSIS — M79671 Pain in right foot: Secondary | ICD-10-CM

## 2016-03-11 MED ORDER — BETAMETHASONE SOD PHOS & ACET 6 (3-3) MG/ML IJ SUSP: 3.0000 mg | Freq: Once | INTRAMUSCULAR | Status: DC

## 2016-03-11 NOTE — Progress Notes (Signed)
Patient ID: Erin Ingram, female   DOB: February 11, 1941, 75 y.o.   MRN: [id number] Subjective: Patient presents today for follow-up evaluation of pain and tenderness in the right foot. Patient states the foot pain has been hurting for several weeks now. Patient states that it hurts in the mornings with the first steps out of bed. Patient presents today for further treatment and evaluation  Objective: Physical Exam General: The patient is alert and oriented x3 in no acute distress.  Dermatology: Skin is warm, dry and supple bilateral lower extremities. Negative for open lesions or macerations bilateral.   Vascular: Dorsalis Pedis and Posterior Tibial pulses palpable bilateral.  Capillary fill time is immediate to all digits.  Neurological: Epicritic and protective threshold intact bilateral.   Musculoskeletal: Tenderness to palpation at the medial calcaneal tubercale and through the insertion of the plantar fascia of the right foot. All other joints range of motion within normal limits bilateral. Strength 5/5 in all groups bilateral.   Radiographic exam: Normal osseous mineralization. Joint spaces preserved. No fracture/dislocation/boney destruction. Calcaneal spur present with mild thickening of plantar fascia right. No other soft tissue abnormalities or radiopaque foreign bodies.   Assessment: 1. Plantar fasciitis right 2. Pain in right foot 3. History of kidney stones  Plan of Care:  1. Patient evaluated. Xrays reviewed.   2. Injection of 0.5 mL [place] Soluspan injected in the patient's right foot at the insertion of the plantar fascia into the medial calcaneal tubercle  3. Continue anti-inflammatory pain cream through [facility]  4. Patient cannot take oral NSAIDS due to a history of kidney stone 5. Continue plantar fascial bands  6. Today we discussed possible immobilization in a cam boot. Patient opts for cam boot. Today cam boot was dispensed  7. Return to clinic in 4  weeks

## 2016-03-12 ENCOUNTER — Telehealth: Payer: Self-pay | Admitting: Neurology

## 2016-03-12 NOTE — Telephone Encounter (Signed)
Spoke to patient - she lost her mychart log in information and would like to get into the system to look at her MRI results.  Says Dr. Krista Blue has already reviewed them with her.  I provided her the mychart help desk number to call.

## 2016-03-12 NOTE — Telephone Encounter (Signed)
Pt would like to get clarification on MRI that was done at the hospital.

## 2016-03-12 NOTE — Progress Notes (Signed)
Patient ID: Erin Ingram, female   DOB: 02/19/1941, 75 y.o.   MRN: [id number]     Cardiology Office Note   Date:  03/13/2016   ID:  Erin Ingram, DOB 01/17/1941, MRN [id number]  PCP:  [name], MD    No chief complaint on file. CAD   Wt Readings from Last 3 Encounters:  03/13/16 222 lb (100.7 kg)  01/23/16 219 lb (99.3 kg)  01/07/16 218 lb (98.9 kg)       History of Present Illness: Erin Ingram is a 75 y.o. female  with a hx of CAD status post CABG in 2000, HTN, HL, sleep apnea, PMR, L subclavian stenosis. She had repeat cardiac catheterization in 2008 that demonstrated patent grafts. She's had a history of persistent angina treated with EECP many years ago. She's been treated with Ranexa, metoprolol, amlodipine, isosorbide.   Admitted 9/28-9/29/16 with worsening angina. LHC demonstrated 90% distal SVG-RCA stenosis. This was treated with 2 overlapping Synergy DES. SVG-OM2 was patent. There is a Y graft with a LIMA-LAD and SVG-diagonal. LIMA portion was patent but SVG-diagonal was occluded.  Readmitted 10/10-10/11/16 with back and right arm pain similar to previous angina. She had resolution with nitroglycerin. Cardiac enzymes remained normal. Relook cardiac catheterization demonstrated patent stents in the vein graft to the RCA and otherwise stable anatomy. Continued medical therapy was recommended.    She denies any further anginal symptoms. She does note dyspnea with exertion.  She had more dyspnea with walking in stores recently.  She denies any orthopnea or PND as long as she uses her CPAP. She has mild pedal edema without change. She denies syncope. She denies any bleeding issues.  She is wearing a boot for plantar fasciitis.  Her walking has been limited by the foot inflammation.   She does Tai Chi 2x/week. She walks a little but not much.  SHe is limited by PMR. She reacted to the Brilinta, with itching rash.  It was changed to clopidorel.  She is doing  better on the plavix.   Studies/Reports Reviewed Today:  LHC 01/01/15  Severe native three vessel CAD.  Y graft. LIMA to LAD (patent); SVG to diagonal (occluded).  Patent SVG to OM.  SVG to RCA graft and recently placed stents are widely patent.  Normal LVEDP.   Echo 01/01/15 Moderate LVH, EF 55-60%, normal wall motion, MAC, mild LAE     Past Medical History:  Diagnosis Date  . (HFpEF) heart failure with preserved ejection fraction (HCC)    a. normal EF, LVEDP at [facility] in 6/17 33 >> Lasix started   . Coronary artery disease    a. s/p CABG 2000 (SVG/ free LIMA Y graft to the diagonal and distal LAD, SVG to the OM 1, and SVG to the PDA)  // b. Cath 12/19/2014 90% dSVG to RCA s/p 2 overlapping DES (3.5 x 28 and 3.5 x 8 mm Synergy)  // c. LHC 10/16: stable anatomy, patent S-RCA stents  //  d. LHC 6/17:  L-LAD of Y graft patent, S-Dx of Y graft occluded, S-OM2 ok, S-RCA stents ok, LVEDP 33  . Depression    with anxious component  . Hyperlipidemia   . Obesity   . OSA on CPAP   . PMR (polymyalgia rheumatica) (HCC)   . Stable angina (HCC)    microvascular, improved with Ranexa  . TIA (transient ischemic attack)     Past Surgical History:  Procedure Laterality Date  . BREAST BIOPSY  1964  .  CARDIAC CATHETERIZATION  08   patent grafts, no culprit lesions, EF 65%  . CARDIAC CATHETERIZATION N/A 12/19/2014   Procedure: Left Heart Cath and Cors/Grafts Angiography;  Surgeon: Jettie Booze, MD;  Location: [place] [facility];  Service: Cardiovascular;  Laterality: N/A;  . CARDIAC CATHETERIZATION N/A 12/19/2014   Procedure: Coronary Stent Intervention;  Surgeon: Jettie Booze, MD;  Location: [place] [facility];  Service: Cardiovascular;  Laterality: N/A;  . CARDIAC CATHETERIZATION N/A 01/01/2015   Procedure: Left Heart Cath and Cors/Grafts Angiography;  Surgeon: Jettie Booze, MD;  Location: [place] [facility];  Service: Cardiovascular;  Laterality: N/A;  . CARDIAC  CATHETERIZATION N/A 09/20/2015   Procedure: Left Heart Cath and Cors/Grafts Angiography;  Surgeon: Jettie Booze, MD;  Location: [place] [facility];  Service: Cardiovascular;  Laterality: N/A;  . CATARACT EXTRACTION Bilateral 2011  . CORONARY ARTERY BYPASS GRAFT  2000   ASCVD, multivessel, S./P.  . REPAIR OF LEFT PATELLAR  1993     Current Outpatient Prescriptions  Medication Sig Dispense Refill  . amLODipine (NORVASC) 5 MG tablet TAKE 1 TABLET ONCE DAILY. 30 tablet 10  . aspirin 81 MG chewable tablet Chew 1 tablet (81 mg total) by mouth daily.    Marland Kitchen atorvastatin (LIPITOR) 10 MG tablet Take 10 mg by mouth daily.    . Calcium Glycerophosphate (PRELIEF PO) Take 1 tablet by mouth daily.     . Cholecalciferol (VITAMIN D PO) Take 1 tablet by mouth daily.    . Cinnamon 500 MG TABS Take 1 each by mouth daily.    . clopidogrel (PLAVIX) 75 MG tablet Take 75 mg by mouth daily.    . Coenzyme Q10 10 MG capsule Take 10 mg by mouth daily.    . fluocinonide cream (LIDEX) 8.93 % Apply 1 application topically 2 (two) times daily as needed (rash).     . fluticasone (FLONASE) 50 MCG/ACT nasal spray Place 2 sprays into both nostrils daily as needed for allergies.     . furosemide (LASIX) 20 MG tablet Take 1 tablet (20 mg total) by mouth daily. 90 tablet 3  . isosorbide mononitrate (IMDUR) 30 MG 24 hr tablet Take 1 tablet (30 mg total) by mouth at bedtime. 30 tablet 11  . isosorbide mononitrate (IMDUR) 60 MG 24 hr tablet TAKE 2 TABLETS BY MOUTH  ONCE A DAY 180 tablet 1  . levocetirizine (XYZAL) 5 MG tablet Take 5 mg by mouth every evening.    Marland Kitchen levothyroxine (SYNTHROID, LEVOTHROID) 75 MCG tablet Take 75 mcg by mouth every evening.     . Melatonin 1 MG TABS Take 5 mg by mouth at bedtime as needed (SLEEP). Reported on 03/22/2015    . metoprolol succinate (TOPROL-XL) 100 MG 24 hr tablet Take 1 tablet by mouth once a day 90 tablet 1  . nitroGLYCERIN (NITROSTAT) 0.4 MG SL tablet Place 0.4 mg under the tongue  every 5 (five) minutes as needed for chest pain. X 3 doses    . NONFORMULARY OR COMPOUNDED ITEM [facility]:  Achilles Tendonitis Cream - Diclofenac 3%, Baclofen 2%, Bupivacaine 1%, GAbapentin 6%, Ibuprofen 3%, Pentoxifylline 3%, apply 1-2 grams to affected area 3-4 times daily. 120 each 2  . Omega-3 Fatty Acids (FISH OIL PO) Take 1 tablet by mouth daily.     . predniSONE (DELTASONE) 10 MG tablet Take 10 mg by mouth daily with breakfast.     . Psyllium 400 MG CAPS Take 400 mg by mouth daily.    Marland Kitchen  RANEXA 1000 MG SR tablet Take 1 tablet by mouth  twice a day 180 tablet 2  . venlafaxine XR (EFFEXOR-XR) 75 MG 24 hr capsule Take 225 mg by mouth daily with breakfast. Patient takes 3 capsules     Current Facility-Administered Medications  Medication Dose Route Frequency Provider Last Rate Last Dose  . betamethasone acetate-betamethasone sodium phosphate (CELESTONE) injection 3 mg  3 mg Intramuscular Once Edrick Kins, DPM        Allergies:   Stadol [butorphanol]; Sulfa antibiotics; Bisoprolol fumarate; Butorphanol tartrate; Codeine; Demerol [meperidine]; Imipramine; Statins; Tequin [gatifloxacin]; Brilinta [ticagrelor]; Pseudoephedrine hcl; Septra [sulfamethoxazole-trimethoprim]; and Sulfamethoxazole-trimethoprim    Social History:  The patient  reports that she quit smoking about 46 years ago. She has never used smokeless tobacco. She reports that she drinks alcohol. She reports that she does not use drugs.   Family History:  The patient's family history includes Diabetes in her brother; Heart attack in her father and mother; Heart disease in her father and mother; Stroke in her paternal grandmother.    ROS:  Please see the history of present illness.   Otherwise, review of systems are positive for chronic DOE.   All other systems are reviewed and negative.    PHYSICAL EXAM: VS:  BP 120/62   Pulse 64   Ht _0  (1.473 m)   Wt 222 lb (100.7 kg)   BMI 46.40 kg/m  , BMI Body mass index  is 46.4 kg/m. GEN: Well nourished, well developed, in no acute distress  HEENT: normal  Neck: no JVD, carotid bruits, or masses Cardiac: RRR; no murmurs, rubs, or gallops, bilateral pitting edema ; walking boot on right foot Respiratory:  clear to auscultation bilaterally, normal work of breathing GI: soft, nontender, nondistended, + BS, obese MS: no deformity or atrophy  Skin: warm and dry, no rash Neuro:  Strength and sensation are intact Psych: euthymic mood, full affect  ECG: NSR, no ST segment changes   Recent Labs: 09/18/2015: TSH 3.616 01/07/2016: ALT 14; BUN 24; Creatinine, Ser 1.26; Hemoglobin 10.8; Platelets 243; Potassium 4.3; Sodium 138   Lipid Panel    Component Value Date/Time   CHOL 163 09/18/2015 0400   TRIG 155 (H) 09/18/2015 0400   HDL 66 09/18/2015 0400   CHOLHDL 2.5 09/18/2015 0400   VLDL 31 09/18/2015 0400   LDLCALC 66 09/18/2015 0400     Other studies Reviewed: Additional studies/ records that were reviewed today with results demonstrating: cath records reviewed.   ASSESSMENT AND PLAN:   CAD:  S/p CABG and recent DES to SVG to RCA in 2016.  No further clear angina. SHOB could be angina- medically treated.  DOing well.   Fluid overload may contribute to her SHOB.  Increase Lasix to double current dose for 4-5 days.  She thinks she is on 10 mg daily of Lasix but our records show 20 mg daily.  Call us back to confirm the dose.   Encouraged to exercise. She has access to machines to exercise.  Continue atorvastatin.  Lipids reviewed and well controlled.    PAD: Known left subclavian stenosis.  Cannot do cath from left radial access site despite 2+ left radial pulse.  HTN: BP controlled.   Tolerating clopidogrel.  Allergic to Brilinta.  DOE: If this persists despite increased diuretics, will have to bring her back in for a visit and labs.    Obesity: Stressed importance of trying to lose weight.   Current medicines are reviewed at length  with the  patient today.  The patient concerns regarding her medicines were addressed.  The following changes have been made:  No change  Labs/ tests ordered today include:  No orders of the defined types were placed in this encounter.   Recommend 150 minutes/week of aerobic exercise Low fat, low carb, high fiber diet recommended  Disposition:   FU in  6 months   Signed, Larae Grooms, MD  03/13/2016 1:37 PM    [facility] [address], [place], [place]  [address] Phone: [phone]; Fax: [phone]

## 2016-03-13 ENCOUNTER — Ambulatory Visit (INDEPENDENT_AMBULATORY_CARE_PROVIDER_SITE_OTHER): Payer: Medicare Other | Admitting: Interventional Cardiology

## 2016-03-13 ENCOUNTER — Encounter: Payer: Self-pay | Admitting: Interventional Cardiology

## 2016-03-13 VITALS — BP 120/62 | HR 64 | Ht <= 58 in | Wt 222.0 lb

## 2016-03-13 DIAGNOSIS — E785 Hyperlipidemia, unspecified: Secondary | ICD-10-CM

## 2016-03-13 DIAGNOSIS — R600 Localized edema: Secondary | ICD-10-CM | POA: Diagnosis not present

## 2016-03-13 DIAGNOSIS — I10 Essential (primary) hypertension: Secondary | ICD-10-CM

## 2016-03-13 DIAGNOSIS — I25118 Atherosclerotic heart disease of native coronary artery with other forms of angina pectoris: Secondary | ICD-10-CM

## 2016-03-13 DIAGNOSIS — R0609 Other forms of dyspnea: Secondary | ICD-10-CM

## 2016-03-13 MED ORDER — FUROSEMIDE 20 MG PO TABS: 20.0000 mg | ORAL_TABLET | Freq: Two times a day (BID) | ORAL | 3 refills | Status: DC | PRN

## 2016-03-13 NOTE — Patient Instructions (Addendum)
Medication Instructions:  Please double your Furosemide for the next 4 days then return to your normal dose.  You may take Furosemide 20 mg up to twice a day as needed for edema. Continue all other medications as listed.  Follow-Up: Follow up in 6 months with Dr. Irish Lack.  You will receive a letter in the mail 2 months before you are due.  Please call us when you receive this letter to schedule your follow up appointment.  If you need a refill on your cardiac medications before your next appointment, please call your pharmacy.  Thank you for choosing [facility]!!    Please increase your foods high in potassium while on the increase dose of Furosemide.

## 2016-04-08 ENCOUNTER — Ambulatory Visit: Payer: Medicare Other | Admitting: Podiatry

## 2016-04-10 ENCOUNTER — Emergency Department (HOSPITAL_COMMUNITY): Payer: Medicare Other

## 2016-04-10 ENCOUNTER — Observation Stay (HOSPITAL_COMMUNITY)
Admission: EM | Admit: 2016-04-10 | Discharge: 2016-04-11 | Disposition: A | Discharge status: Home / Self Care | Payer: Medicare Other | Attending: Internal Medicine | Admitting: Internal Medicine

## 2016-04-10 ENCOUNTER — Encounter (HOSPITAL_COMMUNITY): Payer: Self-pay | Admitting: *Deleted

## 2016-04-10 DIAGNOSIS — Z7982 Long term (current) use of aspirin: Secondary | ICD-10-CM | POA: Diagnosis not present

## 2016-04-10 DIAGNOSIS — Z8673 Personal history of transient ischemic attack (TIA), and cerebral infarction without residual deficits: Secondary | ICD-10-CM | POA: Insufficient documentation

## 2016-04-10 DIAGNOSIS — E785 Hyperlipidemia, unspecified: Secondary | ICD-10-CM | POA: Diagnosis present

## 2016-04-10 DIAGNOSIS — I110 Hypertensive heart disease with heart failure: Secondary | ICD-10-CM | POA: Insufficient documentation

## 2016-04-10 DIAGNOSIS — Z951 Presence of aortocoronary bypass graft: Secondary | ICD-10-CM | POA: Insufficient documentation

## 2016-04-10 DIAGNOSIS — I25110 Atherosclerotic heart disease of native coronary artery with unstable angina pectoris: Secondary | ICD-10-CM | POA: Diagnosis not present

## 2016-04-10 DIAGNOSIS — Z79899 Other long term (current) drug therapy: Secondary | ICD-10-CM | POA: Diagnosis not present

## 2016-04-10 DIAGNOSIS — I25118 Atherosclerotic heart disease of native coronary artery with other forms of angina pectoris: Secondary | ICD-10-CM | POA: Diagnosis not present

## 2016-04-10 DIAGNOSIS — I200 Unstable angina: Secondary | ICD-10-CM | POA: Diagnosis present

## 2016-04-10 DIAGNOSIS — I509 Heart failure, unspecified: Secondary | ICD-10-CM | POA: Diagnosis not present

## 2016-04-10 DIAGNOSIS — Z87891 Personal history of nicotine dependence: Secondary | ICD-10-CM | POA: Insufficient documentation

## 2016-04-10 DIAGNOSIS — I10 Essential (primary) hypertension: Secondary | ICD-10-CM

## 2016-04-10 DIAGNOSIS — R079 Chest pain, unspecified: Secondary | ICD-10-CM | POA: Diagnosis present

## 2016-04-10 DIAGNOSIS — I2510 Atherosclerotic heart disease of native coronary artery without angina pectoris: Secondary | ICD-10-CM | POA: Diagnosis present

## 2016-04-10 DIAGNOSIS — I5030 Unspecified diastolic (congestive) heart failure: Secondary | ICD-10-CM | POA: Diagnosis present

## 2016-04-10 HISTORY — DX: Pneumonia, unspecified organism: J18.9

## 2016-04-10 HISTORY — DX: Gastro-esophageal reflux disease without esophagitis: K21.9

## 2016-04-10 HISTORY — DX: Personal history of other diseases of the digestive system: Z87.19

## 2016-04-10 HISTORY — DX: Unspecified osteoarthritis, unspecified site: M19.90

## 2016-04-10 HISTORY — DX: Hypothyroidism, unspecified: E03.9

## 2016-04-10 HISTORY — DX: Anemia, unspecified: D64.9

## 2016-04-10 LAB — CBC
HCT: 36.4 % (ref 36.0–46.0)
Hemoglobin: 11.6 g/dL — ABNORMAL LOW (ref 12.0–15.0)
MCH: 29.5 pg (ref 26.0–34.0)
MCHC: 31.9 g/dL (ref 30.0–36.0)
MCV: 92.6 fL (ref 78.0–100.0)
Platelets: 245 10*3/uL (ref 150–400)
RBC: 3.93 MIL/uL (ref 3.87–5.11)
RDW: 14.7 % (ref 11.5–15.5)
WBC: 12.5 10*3/uL — ABNORMAL HIGH (ref 4.0–10.5)

## 2016-04-10 LAB — CBC WITH DIFFERENTIAL/PLATELET
Basophils Absolute: 0 10*3/uL (ref 0.0–0.1)
Basophils Relative: 0 %
Eosinophils Absolute: 0.1 10*3/uL (ref 0.0–0.7)
Eosinophils Relative: 1 %
HCT: 33.5 % — ABNORMAL LOW (ref 36.0–46.0)
Hemoglobin: 10.7 g/dL — ABNORMAL LOW (ref 12.0–15.0)
Lymphocytes Relative: 24 %
Lymphs Abs: 3.2 10*3/uL (ref 0.7–4.0)
MCH: 29.4 pg (ref 26.0–34.0)
MCHC: 31.9 g/dL (ref 30.0–36.0)
MCV: 92 fL (ref 78.0–100.0)
Monocytes Absolute: 0.6 10*3/uL (ref 0.1–1.0)
Monocytes Relative: 4 %
Neutro Abs: 9.3 10*3/uL — ABNORMAL HIGH (ref 1.7–7.7)
Neutrophils Relative %: 71 %
Platelets: 226 10*3/uL (ref 150–400)
RBC: 3.64 MIL/uL — ABNORMAL LOW (ref 3.87–5.11)
RDW: 14.7 % (ref 11.5–15.5)
WBC: 13.1 10*3/uL — ABNORMAL HIGH (ref 4.0–10.5)

## 2016-04-10 LAB — BASIC METABOLIC PANEL
Anion gap: 11 (ref 5–15)
BUN: 23 mg/dL — ABNORMAL HIGH (ref 6–20)
CO2: 23 mmol/L (ref 22–32)
Calcium: 9 mg/dL (ref 8.9–10.3)
Chloride: 105 mmol/L (ref 101–111)
Creatinine, Ser: 1.21 mg/dL — ABNORMAL HIGH (ref 0.44–1.00)
GFR calc Af Amer: 49 mL/min — ABNORMAL LOW (ref >60)
GFR calc non Af Amer: 42 mL/min — ABNORMAL LOW (ref >60)
Glucose, Bld: 86 mg/dL (ref 65–99)
Potassium: 3.5 mmol/L (ref 3.5–5.1)
Sodium: 139 mmol/L (ref 135–145)

## 2016-04-10 LAB — I-STAT TROPONIN, ED: Troponin i, poc: 0 ng/mL (ref 0.00–0.08)

## 2016-04-10 LAB — CREATININE, SERUM
Creatinine, Ser: 1.1 mg/dL — ABNORMAL HIGH (ref 0.44–1.00)
GFR calc Af Amer: 55 mL/min — ABNORMAL LOW (ref >60)
GFR calc non Af Amer: 48 mL/min — ABNORMAL LOW (ref >60)

## 2016-04-10 LAB — TROPONIN I
Troponin I: <0.03 ng/mL (ref <0.03)
Troponin I: 0.03 ng/mL (ref <0.03)

## 2016-04-10 MED ORDER — ASPIRIN EC 81 MG PO TBEC: 81.0000 mg | DELAYED_RELEASE_TABLET | Freq: Every day | ORAL | Status: DC

## 2016-04-10 MED ORDER — ISOSORBIDE MONONITRATE ER 30 MG PO TB24: 30.0000 mg | ORAL_TABLET | Freq: Every day | ORAL | Status: DC

## 2016-04-10 MED ORDER — NITROGLYCERIN 0.4 MG SL SUBL
0.4000 mg | SUBLINGUAL_TABLET | SUBLINGUAL | Status: DC | PRN
  Administered 2016-04-10: 0.4 mg via SUBLINGUAL
  Filled 2016-04-10: qty 1

## 2016-04-10 MED ORDER — ISOSORBIDE MONONITRATE ER 60 MG PO TB24
120.0000 mg | ORAL_TABLET | Freq: Every day | ORAL | Status: DC
  Administered 2016-04-10 – 2016-04-11 (×2): 120 mg via ORAL
  Filled 2016-04-10 (×2): qty 2

## 2016-04-10 MED ORDER — ASPIRIN 81 MG PO CHEW
324.0000 mg | CHEWABLE_TABLET | Freq: Once | ORAL | Status: DC
  Filled 2016-04-10: qty 4

## 2016-04-10 MED ORDER — ONDANSETRON HCL 4 MG/2ML IJ SOLN
4.0000 mg | Freq: Four times a day (QID) | INTRAMUSCULAR | Status: DC | PRN
  Administered 2016-04-11: 4 mg via INTRAVENOUS

## 2016-04-10 MED ORDER — CLOPIDOGREL BISULFATE 75 MG PO TABS
75.0000 mg | ORAL_TABLET | Freq: Every day | ORAL | Status: DC
  Administered 2016-04-11: 75 mg via ORAL
  Filled 2016-04-10: qty 1

## 2016-04-10 MED ORDER — PREDNISONE 10 MG PO TABS: 10.0000 mg | ORAL_TABLET | Freq: Every day | ORAL | Status: DC

## 2016-04-10 MED ORDER — ATORVASTATIN CALCIUM 10 MG PO TABS
10.0000 mg | ORAL_TABLET | Freq: Every day | ORAL | Status: DC
  Administered 2016-04-10 – 2016-04-11 (×2): 10 mg via ORAL
  Filled 2016-04-10 (×2): qty 1

## 2016-04-10 MED ORDER — METOPROLOL SUCCINATE ER 100 MG PO TB24
100.0000 mg | ORAL_TABLET | Freq: Every day | ORAL | Status: DC
  Administered 2016-04-10: 100 mg via ORAL
  Filled 2016-04-10: qty 1

## 2016-04-10 MED ORDER — ASPIRIN 81 MG PO CHEW
81.0000 mg | CHEWABLE_TABLET | Freq: Every day | ORAL | Status: DC
  Administered 2016-04-11: 81 mg via ORAL
  Filled 2016-04-10: qty 1

## 2016-04-10 MED ORDER — PREDNISONE 10 MG PO TABS
10.0000 mg | ORAL_TABLET | Freq: Every day | ORAL | Status: DC
  Administered 2016-04-10 – 2016-04-11 (×2): 10 mg via ORAL
  Filled 2016-04-10 (×2): qty 1

## 2016-04-10 MED ORDER — NITROGLYCERIN 0.4 MG SL SUBL: 0.4000 mg | SUBLINGUAL_TABLET | SUBLINGUAL | Status: DC | PRN

## 2016-04-10 MED ORDER — SODIUM CHLORIDE 0.9% FLUSH
3.0000 mL | Freq: Two times a day (BID) | INTRAVENOUS | Status: DC
  Administered 2016-04-10 – 2016-04-11 (×2): 3 mL via INTRAVENOUS

## 2016-04-10 MED ORDER — ENOXAPARIN SODIUM 40 MG/0.4ML ~~LOC~~ SOLN
40.0000 mg | SUBCUTANEOUS | Status: DC
  Administered 2016-04-10: 40 mg via SUBCUTANEOUS
  Filled 2016-04-10: qty 0.4

## 2016-04-10 MED ORDER — AMLODIPINE BESYLATE 5 MG PO TABS
5.0000 mg | ORAL_TABLET | Freq: Every day | ORAL | Status: DC
  Administered 2016-04-10 – 2016-04-11 (×2): 5 mg via ORAL
  Filled 2016-04-10 (×2): qty 1

## 2016-04-10 MED ORDER — METOPROLOL SUCCINATE ER 100 MG PO TB24
100.0000 mg | ORAL_TABLET | Freq: Every day | ORAL | Status: DC
  Filled 2016-04-10: qty 1

## 2016-04-10 MED ORDER — FUROSEMIDE 20 MG PO TABS
20.0000 mg | ORAL_TABLET | Freq: Every day | ORAL | Status: DC
  Administered 2016-04-10 – 2016-04-11 (×2): 20 mg via ORAL
  Filled 2016-04-10 (×2): qty 1

## 2016-04-10 MED ORDER — ACETAMINOPHEN 325 MG PO TABS: 650.0000 mg | ORAL_TABLET | ORAL | Status: DC | PRN

## 2016-04-10 MED ORDER — LEVOTHYROXINE SODIUM 75 MCG PO TABS
75.0000 ug | ORAL_TABLET | Freq: Every day | ORAL | Status: DC
  Administered 2016-04-10 – 2016-04-11 (×2): 75 ug via ORAL
  Filled 2016-04-10 (×2): qty 1

## 2016-04-10 MED ORDER — SODIUM CHLORIDE 0.9 % IV SOLN: 250.0000 mL | INTRAVENOUS | Status: DC | PRN

## 2016-04-10 MED ORDER — RANOLAZINE ER 500 MG PO TB12
1000.0000 mg | ORAL_TABLET | Freq: Two times a day (BID) | ORAL | Status: DC
  Administered 2016-04-10 – 2016-04-11 (×2): 1000 mg via ORAL
  Filled 2016-04-10 (×2): qty 2

## 2016-04-10 MED ORDER — SODIUM CHLORIDE 0.9% FLUSH: 3.0000 mL | INTRAVENOUS | Status: DC | PRN

## 2016-04-10 MED ORDER — CLOPIDOGREL BISULFATE 75 MG PO TABS
75.0000 mg | ORAL_TABLET | Freq: Once | ORAL | Status: AC
  Administered 2016-04-10: 75 mg via ORAL
  Filled 2016-04-10: qty 1

## 2016-04-10 MED ORDER — VENLAFAXINE HCL ER 75 MG PO CP24
225.0000 mg | ORAL_CAPSULE | Freq: Every day | ORAL | Status: DC
  Administered 2016-04-11: 11:00:00 225 mg via ORAL
  Filled 2016-04-10: qty 1

## 2016-04-10 MED ORDER — NITROGLYCERIN 0.4 MG SL SUBL
0.4000 mg | SUBLINGUAL_TABLET | SUBLINGUAL | Status: DC | PRN
Start: 2016-04-10 — End: 2016-04-10

## 2016-04-10 NOTE — Consult Note (Signed)
Cardiology Consult    Patient ID: Erin Ingram MRN: [id number], DOB/AGE: 1940/11/15   Admit date: 04/10/2016 Date of Consult: 04/10/2016  Primary Physician: [name], MD Reason for Consult: Unstable angina Primary Cardiologist: Dr Irish Lack Requesting Provider: Dr Jeneen Rinks   History of Present Illness    Erin Ingram is a 76 y.o. with past medical history significant for CAD status post CABG in 2000, hypertension, hyperlipidemia, sleep apnea using CPAP, polymyalgia rheumatica treated with prednisone and left subclavian stenosis who presents with left arm pain that radiates into her back since last night. We have been asked to evaluate this patient for unstable angina.  Erin Ingram has been having intermittent points of her left arm feeling like someone pushing her arm that lasts a few seconds since last night. She did have a more vigorous tai chi class yesterday. This morning at approximately 7 AM she again developed intermittent left arm discomfort that was mild and on one occasion it became burning and radiated up over her shoulders and neck and over to her right arm. At about 720 she took sublingual nitroglycerin with relief but the discomfort again returned and she presented to the ED. She feels that the left arm discomfort is similar to her previous anginal symptoms but the moving up and over to the right arm was new. She has chronic dyspnea on exertion which had increased in December at which time related to an office visit she increased her Lasix to 40 mg for several days and her symptoms improved. She recently traveled out of state and again developed some shortness of breath for which she on her own increased her Lasix to 40 mg and found improvement. She has trace to 1+ pretibial pitting edema which she says is at her baseline.  EKG shows Sinus rhythm, 59 bpm, abnormal r wave progression, no acute ST/T changes. The first troponin is negative.   The patient has had  persistent angina treated medically with Ranexa, metoprolol, amlodipine and isosorbide.  She is status post CABG in 2000. She had a heart cath in 11/2014 demonstrating 90% distal SVG-RCA stenosis which was treated with 2 overlapping Synergy drug-eluting stents. The saphenous vein graft to the OM 2 was patent there was a Y graft with a LIMA/LAD and SVG-diagonal and the LIMA portion was patent but SVG-diagonal was occluded. She again had anginal symptoms and a relook cardiac cath in 12/2014 demonstrated patent stents in the vein graft to the RCA.   She again was admitted with chest pain in 08/2015 at which time her cardiac enzymes remained normal. Another LHC on 09/20/2015 showed unchanged coronary arteries but increased LVEDP, 33 mmHg, and diuresis with lasix was recommended.  She continues on Lasix 20 mg daily.  Her last echo on 01/01/2015 showed LVEF 55-60%, normal wall motion and MAC with trivial MR. Last myoview was in 03/2006.   Past Medical History   Past Medical History:  Diagnosis Date  . (HFpEF) heart failure with preserved ejection fraction (HCC)    a. normal EF, LVEDP at [facility] in 6/17 33 >> Lasix started   . Coronary artery disease    a. s/p CABG 2000 (SVG/ free LIMA Y graft to the diagonal and distal LAD, SVG to the OM 1, and SVG to the PDA)  // b. Cath 12/19/2014 90% dSVG to RCA s/p 2 overlapping DES (3.5 x 28 and 3.5 x 8 mm Synergy)  // c. LHC 10/16: stable anatomy, patent S-RCA stents  //  d. LHC 6/17:  L-LAD of Y graft patent, S-Dx of Y graft occluded, S-OM2 ok, S-RCA stents ok, LVEDP 33  . Depression    with anxious component  . Hyperlipidemia   . Obesity   . OSA on CPAP   . PMR (polymyalgia rheumatica) (HCC)   . Stable angina (HCC)    microvascular, improved with Ranexa  . TIA (transient ischemic attack)     Past Surgical History:  Procedure Laterality Date  . BREAST BIOPSY  1964  . CARDIAC CATHETERIZATION  08   patent grafts, no culprit lesions, EF 65%  . CARDIAC  CATHETERIZATION N/A 12/19/2014   Procedure: Left Heart Cath and Cors/Grafts Angiography;  Surgeon: Jettie Booze, MD;  Location: [place];  Service: Cardiovascular;  Laterality: N/A;  . CARDIAC CATHETERIZATION N/A 12/19/2014   Procedure: Coronary Stent Intervention;  Surgeon: Jettie Booze, MD;  Location: [facility];  Service: Cardiovascular;  Laterality: N/A;  . CARDIAC CATHETERIZATION N/A 01/01/2015   Procedure: Left Heart Cath and Cors/Grafts Angiography;  Surgeon: Jettie Booze, MD;  Location: [place] [facility];  Service: Cardiovascular;  Laterality: N/A;  . CARDIAC CATHETERIZATION N/A 09/20/2015   Procedure: Left Heart Cath and Cors/Grafts Angiography;  Surgeon: Jettie Booze, MD;  Location: [place] [facility];  Service: Cardiovascular;  Laterality: N/A;  . CATARACT EXTRACTION Bilateral 2011  . CORONARY ARTERY BYPASS GRAFT  2000   ASCVD, multivessel, S./P.  . REPAIR OF LEFT PATELLAR  1993     Allergies  Allergies  Allergen Reactions  . Stadol [Butorphanol] Other (See Comments)    agitation Constipation  . Sulfa Antibiotics Rash  . Bisoprolol Fumarate Other (See Comments)    Doesn't remember  . Butorphanol Tartrate Other (See Comments)    Produced a lot of urine, made patient feel crazy  . Codeine Nausea And Vomiting  . Demerol [Meperidine] Nausea And Vomiting  . Imipramine     Sweating, facial dysfunction   . Statins     MYALGIAS  . Tequin [Gatifloxacin] Other (See Comments)    Caused hypoglycemia Low blood sugar  . Brilinta [Ticagrelor] Rash    CAUSES PETECHIAE, PURPURA  . Pseudoephedrine Hcl Palpitations  . Septra [Sulfamethoxazole-Trimethoprim] Rash  . Sulfamethoxazole-Trimethoprim Rash    Inpatient Medications    . aspirin  324 mg Oral Once    Family History    Family History  Problem Relation Age of Onset  . Heart disease Mother   . Heart attack Mother   . Heart disease Father   . Heart attack Father   . Diabetes  Brother   . Stroke Paternal Grandmother   . Hypertension Neg Hx     Social History    Social History   Social History  . Marital status: Married    Spouse name: N/A  . Number of children: 2  . Years of education: College   Occupational History  . Retired Cytogeneticist    Social History Main Topics  . Smoking status: Former Smoker    Quit date: 08/21/1969  . Smokeless tobacco: Never Used  . Alcohol use 0.0 oz/week     Comment: Glass of wine 2-3 x year  . Drug use: No  . Sexual activity: Not on file   Other Topics Concern  . Not on file   Social History Narrative   Lives in [place] with husband.    Right-handed.   1 cup caffeine per day.  Review of Systems   General:  No chills, fever, night sweats or weight changes.  Cardiovascular:  Positive for dyspnea on exertion, edema, negative for orthopnea, palpitations, paroxysmal nocturnal dyspnea. Dermatological: No rash, lesions/masses Respiratory: No cough, chronic dyspnea on exertion at her baseline Urologic: No hematuria, dysuria Abdominal:   No nausea, vomiting, diarrhea, bright red blood per rectum, melena, or hematemesis Neurologic:  No visual changes, wkns, changes in mental status. All other systems reviewed and are otherwise negative except as noted above.  Physical Exam    Blood pressure (!) 126/49, pulse 60, temperature 98.7 F (37.1 C), temperature source Oral, resp. rate 19, SpO2 97 %.  General: Pleasant,obese caucasian female, NAD Psych: Normal affect. Neuro: Alert and oriented X 3. Moves all extremities spontaneously. HEENT: Normal  Neck: Supple without bruits or JVD. Lungs:  Resp regular and unlabored, CTA. Heart: RRR no s3, s4, or murmurs. Abdomen: Soft, non-tender, non-distended, BS + x 4.  Extremities: No clubbing or cyanosis. Trace-1+ pretibial edema. DP/PT/Radials 2+ and equal bilaterally.  Labs    Troponin (Point of Care Test)  Recent Labs  04/10/16 1009  TROPIPOC 0.00    No results for input(s): CKTOTAL, CKMB, TROPONINI in the last 72 hours. Lab Results  Component Value Date   WBC 13.1 (H) 04/10/2016   HGB 10.7 (L) 04/10/2016   HCT 33.5 (L) 04/10/2016   MCV 92.0 04/10/2016   PLT 226 04/10/2016    Recent Labs Lab 04/10/16 1005  NA 139  K 3.5  CL 105  CO2 23  BUN 23*  CREATININE 1.21*  CALCIUM 9.0  GLUCOSE 86   Lab Results  Component Value Date   CHOL 163 09/18/2015   HDL 66 09/18/2015   LDLCALC 66 09/18/2015   TRIG 155 (H) 09/18/2015   No results found for: [facility]   Radiology Studies    Dg Chest Port 1 View  Result Date: 04/10/2016 CLINICAL DATA:  Intermittent left arm pain radiating to the back. Symptoms began yesterday. EXAM: PORTABLE CHEST 1 VIEW COMPARISON:  09/17/2015 FINDINGS: Previous median sternotomy and CABG. Mild cardiac enlargement. Aortic atherosclerosis. Venous hypertension without frank edema. No visible effusion. No acute bone finding. IMPRESSION: Cardiomegaly and venous hypertension.  Previous CABG. Electronically Signed   By: Nelson Chimes M.D.   On: 04/10/2016 10:27    EKG & Cardiac Imaging    EKG: Sinus rhythm, 59 bpm, abnormal r wave progression, no acute ST/T changes  Echocardiogram:  Her last echo on 01/01/2015 showed LVEF 55-60%, normal wall motion and MAC with trivial MR  Assessment & Plan    1. Chest pain -Patient with left arm points of discomfort, a mild pushing feeling, similar to her previous anginal symptoms. Breathing is at her baseline with no recent increase in dyspnea on exertion. No substernal discomfort -EKG shows Sinus rhythm, 59 bpm, poor R wave progression, no acute ST/T changes. The first troponin is negative.  -Patient has previous history of CABG in 2000 and stents to the RCA graft in 2016 -Considering patient's previous history will continue to monitor and cycle troponins, check echo for wall motion and LV function -Continue her medical therapy for angina including isosorbide, Ranexa,  beta blocker and nitroglycerin as needed  2. CAD status post CABG and stents to saphenous vein graft to the RCA -Continue Plavix, patient had a reaction to Brilinta with itching -Continue aspirin, statin, beta blocker, isosorbide, Ranexa  3. Heart failure with preserved ejection fraction -Last Echo in 2016 shoed normal EF 55-60% -Cath  in 08/2015 showed elevated LVEDP of 33 mm Hg -Has been on lasix 20 mg daily. Creatinine is 1.21, was 1.26 in 12/2015 and 1.46 and 09/2015. We'll continue Lasix. -Patient does not appear significantly volume overloaded  4. Hypertension -Blood pressure stable, continue beta blocker, amlodipine, isosorbide, and Lasix  5 hyperlipidemia -LDL 66 on 09/18/2015 -Continue Lipitor  6 obstructive sleep apnea -Continue CPAP at at bedtime  7 poly-myalgia rheumatica -Continue prednisone  8 anemia -Hemoglobin is 10.7 and appears to be her baseline  Tildon Husky, NP-C 04/10/2016, 10:54 AM Pager: [phone]

## 2016-04-10 NOTE — Care Management Important Message (Signed)
Important Message  Patient Details  Name: Erin Ingram MRN: [id number] Date of Birth: [birth year]   Medicare Important Message Given:  Yes    Nathen May 04/10/2016, 5:11 PM

## 2016-04-10 NOTE — ED Triage Notes (Signed)
Pt has cardiac hx, reports intermitent left arm pain that radiates into her back since last night. Denies cp. This is similar to cardiac symptoms in past. Took asa and nitro pta. Is pain free at this time.

## 2016-04-10 NOTE — H&P (Signed)
Cardiology Consult    Patient ID: Erin MITCHELTREE MRN: [id number], DOB/AGE: 1940/11/15   Admit date: 04/10/2016 Date of Consult: 04/10/2016  Primary Physician: [name], MD Reason for Consult: Unstable angina Primary Cardiologist: Dr Irish Lack Requesting Provider: Dr Jeneen Rinks   History of Present Illness    Erin Ingram is a 76 y.o. with past medical history significant for CAD status post CABG in 2000, hypertension, hyperlipidemia, sleep apnea using CPAP, polymyalgia rheumatica treated with prednisone and left subclavian stenosis who presents with left arm pain that radiates into her back since last night. We have been asked to evaluate this patient for unstable angina.  [name] has been having intermittent points of her left arm feeling like someone pushing her arm that lasts a few seconds since last night. She did have a more vigorous tai chi class yesterday. This morning at approximately 7 AM she again developed intermittent left arm discomfort that was mild and on one occasion it became burning and radiated up over her shoulders and neck and over to her right arm. At about 720 she took sublingual nitroglycerin with relief but the discomfort again returned and she presented to the ED. She feels that the left arm discomfort is similar to her previous anginal symptoms but the moving up and over to the right arm was new. She has chronic dyspnea on exertion which had increased in December at which time related to an office visit she increased her Lasix to 40 mg for several days and her symptoms improved. She recently traveled out of state and again developed some shortness of breath for which she on her own increased her Lasix to 40 mg and found improvement. She has trace to 1+ pretibial pitting edema which she says is at her baseline.  EKG shows Sinus rhythm, 59 bpm, abnormal r wave progression, no acute ST/T changes. The first troponin is negative.   The patient has had  persistent angina treated medically with Ranexa, metoprolol, amlodipine and isosorbide.  She is status post CABG in 2000. She had a heart cath in 11/2014 demonstrating 90% distal SVG-RCA stenosis which was treated with 2 overlapping Synergy drug-eluting stents. The saphenous vein graft to the OM 2 was patent there was a Y graft with a LIMA/LAD and SVG-diagonal and the LIMA portion was patent but SVG-diagonal was occluded. She again had anginal symptoms and a relook cardiac cath in 12/2014 demonstrated patent stents in the vein graft to the RCA.   She again was admitted with chest pain in 08/2015 at which time her cardiac enzymes remained normal. Another LHC on 09/20/2015 showed unchanged coronary arteries but increased LVEDP, 33 mmHg, and diuresis with lasix was recommended.  She continues on Lasix 20 mg daily.  Her last echo on 01/01/2015 showed LVEF 55-60%, normal wall motion and MAC with trivial MR. Last myoview was in 03/2006.   Past Medical History   Past Medical History:  Diagnosis Date  . (HFpEF) heart failure with preserved ejection fraction (HCC)    a. normal EF, LVEDP at [facility] in 6/17 33 >> Lasix started   . Coronary artery disease    a. s/p CABG 2000 (SVG/ free LIMA Y graft to the diagonal and distal LAD, SVG to the OM 1, and SVG to the PDA)  // b. Cath 12/19/2014 90% dSVG to RCA s/p 2 overlapping DES (3.5 x 28 and 3.5 x 8 mm Synergy)  // c. LHC 10/16: stable anatomy, patent S-RCA stents  //  d. LHC 6/17:  L-LAD of Y graft patent, S-Dx of Y graft occluded, S-OM2 ok, S-RCA stents ok, LVEDP 33  . Depression    with anxious component  . Hyperlipidemia   . Obesity   . OSA on CPAP   . PMR (polymyalgia rheumatica) (HCC)   . Stable angina (HCC)    microvascular, improved with Ranexa  . TIA (transient ischemic attack)     Past Surgical History:  Procedure Laterality Date  . BREAST BIOPSY  1964  . CARDIAC CATHETERIZATION  08   patent grafts, no culprit lesions, EF 65%  . CARDIAC  CATHETERIZATION N/A 12/19/2014   Procedure: Left Heart Cath and Cors/Grafts Angiography;  Surgeon: Jettie Booze, MD;  Location: [place] [facility];  Service: Cardiovascular;  Laterality: N/A;  . CARDIAC CATHETERIZATION N/A 12/19/2014   Procedure: Coronary Stent Intervention;  Surgeon: Jettie Booze, MD;  Location: [place] [facility];  Service: Cardiovascular;  Laterality: N/A;  . CARDIAC CATHETERIZATION N/A 01/01/2015   Procedure: Left Heart Cath and Cors/Grafts Angiography;  Surgeon: Jettie Booze, MD;  Location: [place] [facility];  Service: Cardiovascular;  Laterality: N/A;  . CARDIAC CATHETERIZATION N/A 09/20/2015   Procedure: Left Heart Cath and Cors/Grafts Angiography;  Surgeon: Jettie Booze, MD;  Location: [place] [facility];  Service: Cardiovascular;  Laterality: N/A;  . CATARACT EXTRACTION Bilateral 2011  . CORONARY ARTERY BYPASS GRAFT  2000   ASCVD, multivessel, S./P.  . REPAIR OF LEFT PATELLAR  1993     Allergies  Allergies  Allergen Reactions  . Stadol [Butorphanol] Other (See Comments)    agitation Constipation  . Sulfa Antibiotics Rash  . Bisoprolol Fumarate Other (See Comments)    Doesn't remember  . Butorphanol Tartrate Other (See Comments)    Produced a lot of urine, made patient feel crazy  . Codeine Nausea And Vomiting  . Demerol [Meperidine] Nausea And Vomiting  . Imipramine     Sweating, facial dysfunction   . Statins     MYALGIAS  . Tequin [Gatifloxacin] Other (See Comments)    Caused hypoglycemia Low blood sugar  . Brilinta [Ticagrelor] Rash    CAUSES PETECHIAE, PURPURA  . Pseudoephedrine Hcl Palpitations  . Septra [Sulfamethoxazole-Trimethoprim] Rash  . Sulfamethoxazole-Trimethoprim Rash    Inpatient Medications    . aspirin  324 mg Oral Once    Family History    Family History  Problem Relation Age of Onset  . Heart disease Mother   . Heart attack Mother   . Heart disease Father   . Heart attack Father   . Diabetes  Brother   . Stroke Paternal Grandmother   . Hypertension Neg Hx     Social History    Social History   Social History  . Marital status: Married    Spouse name: N/A  . Number of children: 2  . Years of education: College   Occupational History  . Retired Cytogeneticist    Social History Main Topics  . Smoking status: Former Smoker    Quit date: 08/21/1969  . Smokeless tobacco: Never Used  . Alcohol use 0.0 oz/week     Comment: Glass of wine 2-3 x year  . Drug use: No  . Sexual activity: Not on file   Other Topics Concern  . Not on file   Social History Narrative   Lives in [place] with husband.    Right-handed.   1 cup caffeine per day.  Review of Systems   General:  No chills, fever, night sweats or weight changes.  Cardiovascular:  Positive for dyspnea on exertion, edema, negative for orthopnea, palpitations, paroxysmal nocturnal dyspnea. Dermatological: No rash, lesions/masses Respiratory: No cough, chronic dyspnea on exertion at her baseline Urologic: No hematuria, dysuria Abdominal:   No nausea, vomiting, diarrhea, bright red blood per rectum, melena, or hematemesis Neurologic:  No visual changes, wkns, changes in mental status. All other systems reviewed and are otherwise negative except as noted above.  Physical Exam    Blood pressure (!) 126/49, pulse 60, temperature 98.7 F (37.1 C), temperature source Oral, resp. rate 19, SpO2 97 %.  General: Pleasant,obese caucasian female, NAD Psych: Normal affect. Neuro: Alert and oriented X 3. Moves all extremities spontaneously. HEENT: Normal  Neck: Supple without bruits or JVD. Lungs:  Resp regular and unlabored, CTA. Heart: RRR no s3, s4, or murmurs. Abdomen: Soft, non-tender, non-distended, BS + x 4.  Extremities: No clubbing or cyanosis. Trace-1+ pretibial edema. DP/PT/Radials 2+ and equal bilaterally.  Labs    Troponin (Point of Care Test)  Recent Labs  04/10/16 1009  TROPIPOC 0.00    No results for input(s): CKTOTAL, CKMB, TROPONINI in the last 72 hours. Lab Results  Component Value Date   WBC 13.1 (H) 04/10/2016   HGB 10.7 (L) 04/10/2016   HCT 33.5 (L) 04/10/2016   MCV 92.0 04/10/2016   PLT 226 04/10/2016    Recent Labs Lab 04/10/16 1005  NA 139  K 3.5  CL 105  CO2 23  BUN 23*  CREATININE 1.21*  CALCIUM 9.0  GLUCOSE 86   Lab Results  Component Value Date   CHOL 163 09/18/2015   HDL 66 09/18/2015   LDLCALC 66 09/18/2015   TRIG 155 (H) 09/18/2015   No results found for: [facility]   Radiology Studies    Dg Chest Port 1 View  Result Date: 04/10/2016 CLINICAL DATA:  Intermittent left arm pain radiating to the back. Symptoms began yesterday. EXAM: PORTABLE CHEST 1 VIEW COMPARISON:  09/17/2015 FINDINGS: Previous median sternotomy and CABG. Mild cardiac enlargement. Aortic atherosclerosis. Venous hypertension without frank edema. No visible effusion. No acute bone finding. IMPRESSION: Cardiomegaly and venous hypertension.  Previous CABG. Electronically Signed   By: Nelson Chimes M.D.   On: 04/10/2016 10:27    EKG & Cardiac Imaging    EKG: Sinus rhythm, 59 bpm, abnormal r wave progression, no acute ST/T changes  Echocardiogram:  Her last echo on 01/01/2015 showed LVEF 55-60%, normal wall motion and MAC with trivial MR  Assessment & Plan    1. Chest pain- unstable angina -Patient with left arm points of discomfort, a mild pushing feeling, similar to her previous anginal symptoms. Breathing is at her baseline with no recent increase in dyspnea on exertion. No substernal discomfort -EKG shows Sinus rhythm, 59 bpm, poor R wave progression, no acute ST/T changes. The first troponin is negative.  -Patient has previous history of CABG in 2000 and stents to the RCA graft in 2016. Repeat cath in 08/2015 with no acute findings.  -Considering patient's previous history will admit and monitor, cycle troponins, check echo for wall motion and LV function, plan for  stress test tomorrow if troponins negative.  -Continue her medical therapy for angina including isosorbide (60 mg bid and 30 mg at hs), Ranexa (1000 mg bid), beta blocker and nitroglycerin as needed  2. CAD status post CABG and stents to saphenous vein graft to the RCA -Continue Plavix (patient had  a reaction to Brilinta in there past with itching) -Continue aspirin, statin, beta blocker, isosorbide, Ranexa  3. Heart failure with preserved ejection fraction -Last Echo in 2016 shoed normal EF 55-60% -Cath in 08/2015 showed elevated LVEDP of 33 mm Hg -Has been on lasix 20 mg daily. Creatinine is 1.21, was 1.26 in 12/2015 and 1.46 and 09/2015. We'll continue Lasix. -Patient does not appear significantly volume overloaded  4. Hypertension -Blood pressure stable, continue beta blocker, amlodipine, isosorbide, and Lasix  5 hyperlipidemia -LDL 66 on 09/18/2015 -Continue Lipitor  6 obstructive sleep apnea -Continue CPAP at at bedtime  7 poly-myalgia rheumatica -Continue prednisone  8 anemia -Hemoglobin is 10.7 and appears to be her baseline  Tildon Husky, NP-C 04/10/2016, 10:54 AM Pager: [phone]

## 2016-04-10 NOTE — ED Provider Notes (Signed)
[facility]-EMERGENCY DEPT Provider Note   CSN: [id number] Arrival date & time: 04/10/16  0123     History   Chief Complaint Chief Complaint  Patient presents with  . Back Pain  . Arm Pain    HPI Erin Ingram is a 76 y.o. female. Chief complaint of chest, back, and left arm pain. She states that this is similar to her history of angina.  HPI:  Patient reports history of coronary artery disease, congestive heart failure, and coronary artery bypass grafting. She follows with Dr. Isabel Caprice, [facility].  Her last heart cath was June of this year. She cannot recall she had stents placed that are not. She's had pain in her left arm intermittent since last Sunday. Today is Friday. She states it was at its worse last night and it was in her left jaw chest back and left arm. She was able to fall asleep but was restless. It was present again this morning upon awakening. She took 3 nitroglycerin and it decreased but did not go away and she presents here. She does not feel short of breath orthopnea and does not have CHF symptoms currently. She states this "feels like my heart".  States that she did more tai chi yesterday than usual but does not feel like this is reproducible or muscular.  She states that a few weeks ago she was having some more difficulty with shortness of breath and her Lasix was increased. Her edema in her legs has diminished and this has helped.  Past Medical History:  Diagnosis Date  . (HFpEF) heart failure with preserved ejection fraction (HCC)    a. normal EF, LVEDP at [facility] in 6/17 33 >> Lasix started   . Coronary artery disease    a. s/p CABG 2000 (SVG/ free LIMA Y graft to the diagonal and distal LAD, SVG to the OM 1, and SVG to the PDA)  // b. Cath 12/19/2014 90% dSVG to RCA s/p 2 overlapping DES (3.5 x 28 and 3.5 x 8 mm Synergy)  // c. LHC 10/16: stable anatomy, patent S-RCA stents  //  d. LHC 6/17:  L-LAD of Y graft patent, S-Dx of Y graft occluded, S-OM2 ok, S-RCA stents ok, LVEDP  33  . Depression    with anxious component  . Hyperlipidemia   . Obesity   . OSA on CPAP   . PMR (polymyalgia rheumatica) (HCC)   . Stable angina (HCC)    microvascular, improved with Ranexa  . TIA (transient ischemic attack)     Patient Active Problem List   Diagnosis Date Noted  . Chest pain 04/10/2016  . Bilateral lower extremity edema 03/13/2016  . (HFpEF) heart failure with preserved ejection fraction (HCC) 10/08/2015  . CAD (coronary artery disease) 10/08/2015  . Chest pain at rest   . Subclavian arterial stenosis (HCC)   . Dyslipidemia   . Hyperlipidemia LDL goal <70   . PAD (peripheral artery disease) (HCC) 04/11/2015  . Essential hypertension 04/11/2014  . Morbid obesity (HCC) 03/22/2013  . Anemia, unspecified 03/22/2013  . Depressive disorder, not elsewhere classified 03/22/2013  . Generalized osteoarthrosis, unspecified site 03/22/2013  . Unspecified sleep apnea 03/22/2013  . Impaired fasting glucose 03/22/2013  . 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 Encounter for long-term (current) use of other medications 03/22/2013    Past Surgical History:  Procedure Laterality Date  . BREAST BIOPSY  1964  . CARDIAC CATHETERIZATION  08   patent grafts, no culprit lesions, EF 65%  . CARDIAC  CATHETERIZATION N/A 12/19/2014   Procedure: Left Heart Cath and Cors/Grafts Angiography;  Surgeon: Jettie Booze, MD;  Location: [place] [facility];  Service: Cardiovascular;  Laterality: N/A;  . CARDIAC CATHETERIZATION N/A 12/19/2014   Procedure: Coronary Stent Intervention;  Surgeon: Jettie Booze, MD;  Location: [place] [facility];  Service: Cardiovascular;  Laterality: N/A;  . CARDIAC CATHETERIZATION N/A 01/01/2015   Procedure: Left Heart Cath and Cors/Grafts Angiography;  Surgeon: Jettie Booze, MD;  Location: [place] [facility];  Service: Cardiovascular;  Laterality: N/A;  . CARDIAC CATHETERIZATION N/A 09/20/2015   Procedure: Left Heart Cath and Cors/Grafts  Angiography;  Surgeon: Jettie Booze, MD;  Location: [place] [facility];  Service: Cardiovascular;  Laterality: N/A;  . CATARACT EXTRACTION Bilateral 2011  . CORONARY ARTERY BYPASS GRAFT  2000   ASCVD, multivessel, S./P.  . REPAIR OF LEFT PATELLAR  1993    OB History    No data available       Home Medications    Prior to Admission medications   Medication Sig Start Date End Date Taking? Authorizing Provider  amLODipine (NORVASC) 5 MG tablet TAKE 1 TABLET ONCE DAILY. 11/12/15   Jettie Booze, MD  aspirin 81 MG chewable tablet Chew 1 tablet (81 mg total) by mouth daily. 12/20/14   Almyra Deforest, PA  atorvastatin (LIPITOR) 10 MG tablet Take 10 mg by mouth daily.    Historical Provider, MD  Calcium Glycerophosphate (PRELIEF PO) Take 1 tablet by mouth daily.     Historical Provider, MD  Cholecalciferol (VITAMIN D PO) Take 1 tablet by mouth daily.    Historical Provider, MD  Cinnamon 500 MG TABS Take 1 each by mouth daily.    Historical Provider, MD  clopidogrel (PLAVIX) 75 MG tablet Take 75 mg by mouth daily.    Historical Provider, MD  Coenzyme Q10 10 MG capsule Take 10 mg by mouth daily.    Historical Provider, MD  fluocinonide cream (LIDEX) 1.01 % Apply 1 application topically 2 (two) times daily as needed (rash).     Historical Provider, MD  fluticasone (FLONASE) 50 MCG/ACT nasal spray Place 2 sprays into both nostrils daily as needed for allergies.     Historical Provider, MD  furosemide (LASIX) 20 MG tablet Take 1 tablet (20 mg total) by mouth 2 (two) times daily as needed. 03/13/16   Jettie Booze, MD  isosorbide mononitrate (IMDUR) 30 MG 24 hr tablet Take 1 tablet (30 mg total) by mouth at bedtime. 10/22/15   Jettie Booze, MD  isosorbide mononitrate (IMDUR) 60 MG 24 hr tablet TAKE 2 TABLETS BY MOUTH  ONCE A DAY 02/27/16   Jettie Booze, MD  levocetirizine (XYZAL) 5 MG tablet Take 5 mg by mouth every evening.    Historical Provider, MD  levothyroxine (SYNTHROID,  LEVOTHROID) 75 MCG tablet Take 75 mcg by mouth every evening.     Historical Provider, MD  Melatonin 1 MG TABS Take 5 mg by mouth at bedtime as needed (SLEEP). Reported on 03/22/2015    Historical Provider, MD  metoprolol succinate (TOPROL-XL) 100 MG 24 hr tablet Take 1 tablet by mouth once a day 08/26/15   Jettie Booze, MD  nitroGLYCERIN (NITROSTAT) 0.4 MG SL tablet Place 0.4 mg under the tongue every 5 (five) minutes as needed for chest pain. X 3 doses    Historical Provider, MD  NONFORMULARY OR COMPOUNDED [place]:  Achilles Tendonitis Cream - Diclofenac 3%, Baclofen 2%, Bupivacaine  1%, GAbapentin 6%, Ibuprofen 3%, Pentoxifylline 3%, apply 1-2 grams to affected area 3-4 times daily. 02/28/16   Edrick Kins, DPM  Omega-3 Fatty Acids (FISH OIL PO) Take 1 tablet by mouth daily.     Historical Provider, MD  predniSONE (DELTASONE) 10 MG tablet Take 10 mg by mouth daily with breakfast.     Historical Provider, MD  Psyllium 400 MG CAPS Take 400 mg by mouth daily.    Historical Provider, MD  RANEXA 1000 MG SR tablet Take 1 tablet by mouth  twice a day 08/20/15   Jettie Booze, MD  venlafaxine XR (EFFEXOR-XR) 75 MG 24 hr capsule Take 225 mg by mouth daily with breakfast. Patient takes 3 capsules    Historical Provider, MD    Family History Family History  Problem Relation Age of Onset  . Heart disease Mother   . Heart attack Mother   . Heart disease Father   . Heart attack Father   . Diabetes Brother   . Stroke Paternal Grandmother   . Hypertension Neg Hx     Social History Social History  Substance Use Topics  . Smoking status: Former Smoker    Quit date: 08/21/1969  . Smokeless tobacco: Never Used  . Alcohol use 0.0 oz/week     Comment: Glass of wine 2-3 x year     Allergies   Stadol [butorphanol]; Sulfa antibiotics; Bisoprolol fumarate; Butorphanol tartrate; Codeine; Demerol [meperidine]; Imipramine; Statins; Tequin [gatifloxacin]; Brilinta [ticagrelor];  Pseudoephedrine hcl; Septra [sulfamethoxazole-trimethoprim]; and Sulfamethoxazole-trimethoprim   Review of Systems Review of Systems  Constitutional: Negative for appetite change, chills, diaphoresis, fatigue and fever.  HENT: Negative for mouth sores, sore throat and trouble swallowing.   Eyes: Negative for visual disturbance.  Respiratory: Negative for cough, chest tightness, shortness of breath and wheezing.   Cardiovascular: Positive for chest pain.  Gastrointestinal: Negative for abdominal distention, abdominal pain, diarrhea, nausea and vomiting.  Endocrine: Negative for polydipsia, polyphagia and polyuria.  Genitourinary: Negative for dysuria, frequency and hematuria.  Musculoskeletal: Negative for gait problem.  Skin: Negative for color change, pallor and rash.  Neurological: Negative for dizziness, syncope, light-headedness and headaches.  Hematological: Does not bruise/bleed easily.  Psychiatric/Behavioral: Negative for behavioral problems and confusion.     Physical Exam Updated Vital Signs BP 132/65   Pulse 63   Temp 98.7 F (37.1 C) (Oral)   Resp 19   SpO2 98%   Physical Exam  Constitutional: She is oriented to person, place, and time. She appears well-developed and well-nourished. No distress.  Short stature, obese. No acute distress.  HENT:  Head: Normocephalic.  Eyes: Conjunctivae are normal. Pupils are equal, round, and reactive to light. No scleral icterus.  Neck: Normal range of motion. Neck supple. No thyromegaly present.  Cardiovascular: Normal rate and regular rhythm.  Exam reveals no gallop and no friction rub.   No murmur heard. Pulmonary/Chest: Effort normal and breath sounds normal. No respiratory distress. She has no wheezes. She has no rales.  No crackles or rales. No focal diminished breath sounds. No S4. Trace lower extremity edema.  Abdominal: Soft. Bowel sounds are normal. She exhibits no distension. There is no tenderness. There is no rebound.   Musculoskeletal: Normal range of motion.  Neurological: She is alert and oriented to person, place, and time.  Skin: Skin is warm and dry. No rash noted.  Psychiatric: She has a normal mood and affect. Her behavior is normal.     ED Treatments / Results  Labs (  all labs ordered are listed, but only abnormal results are displayed) Labs Reviewed  CBC WITH DIFFERENTIAL/PLATELET - Abnormal; Notable for the following:       Result Value   WBC 13.1 (*)    RBC 3.64 (*)    Hemoglobin 10.7 (*)    HCT 33.5 (*)    Neutro Abs 9.3 (*)    All other components within normal limits  BASIC METABOLIC PANEL - Abnormal; Notable for the following:    BUN 23 (*)    Creatinine, Ser 1.21 (*)    GFR calc non Af Amer 42 (*)    GFR calc Af Amer 49 (*)    All other components within normal limits  I-STAT TROPOININ, ED    EKG  EKG Interpretation  Date/Time:  Friday April 10 2016 09:40:26 EST Ventricular Rate:  59 PR Interval:    QRS Duration: 88 QT Interval:  453 QTC Calculation: 449 R Axis:   0 Text Interpretation:  Sinus rhythm Low voltage, precordial leads Abnormal R-wave progression, early transition Nonspecific T abnormalities, lateral leads Confirmed by Jeneen Rinks  MD, [place] ([id number]) on 04/10/2016 10:53:09 AM       Radiology Dg Chest Port 1 View  Result Date: 04/10/2016 CLINICAL DATA:  Intermittent left arm pain radiating to the back. Symptoms began yesterday. EXAM: PORTABLE CHEST 1 VIEW COMPARISON:  09/17/2015 FINDINGS: Previous median sternotomy and CABG. Mild cardiac enlargement. Aortic atherosclerosis. Venous hypertension without frank edema. No visible effusion. No acute bone finding. IMPRESSION: Cardiomegaly and venous hypertension.  Previous CABG. Electronically Signed   By: Nelson Chimes M.D.   On: 04/10/2016 10:27    Procedures Procedures (including critical care time)  Medications Ordered in ED Medications  nitroGLYCERIN (NITROSTAT) SL tablet 0.4 mg (0.4 mg Sublingual Given 04/10/16  1019)  aspirin chewable tablet 324 mg (324 mg Oral Not Given 04/10/16 1019)  clopidogrel (PLAVIX) tablet 75 mg (75 mg Oral Given 04/10/16 1020)     Initial Impression / Assessment and Plan / ED Course  I have reviewed the triage vital signs and the nursing notes.  Pertinent labs & imaging results that were available during my care of the patient were reviewed by me and considered in my medical decision making (see chart for details).     EKG shows sinus rhythm. Low voltage. No ST or T-wave changes. No acute changes noted. Discussion has not taken aspirin and Plavix as yet. Heart cath reviewed from 6/17.   Severe three vessel CAD.  Patent SVG to OM.  Patent SVG to PDA. Patent stents in this graft.  Y graft with free LIMA to LAD and SVG to diagonal. LIMA portion is patent while vein graft to diagonal is occluded.  Elevated LVEDP 33 mm Hg.   "No change in coronary anatomy since October 2016. Patent stents in the SVG to PDA. Elevated LVEDP. Would diurese with Lasix to help reduce intravascular volume."  We'll discuss with her cardiologist.  Final Clinical Impressions(s) / ED Diagnoses   Final diagnoses:  Chest pain, unspecified type    New Prescriptions New Prescriptions   No medications on file     Tanna Furry, MD 04/10/16 1306

## 2016-04-11 ENCOUNTER — Observation Stay (HOSPITAL_BASED_OUTPATIENT_CLINIC_OR_DEPARTMENT_OTHER): Payer: Medicare Other

## 2016-04-11 DIAGNOSIS — R079 Chest pain, unspecified: Secondary | ICD-10-CM

## 2016-04-11 DIAGNOSIS — I2510 Atherosclerotic heart disease of native coronary artery without angina pectoris: Secondary | ICD-10-CM | POA: Diagnosis not present

## 2016-04-11 LAB — NM MYOCAR MULTI W/SPECT W/WALL MOTION / EF
Estimated workload: 1 METS
Exercise duration (min): 5 min
MPHR: 144 {beats}/min
Peak HR: 94 {beats}/min
Percent HR: 65 %
Rest HR: 59 {beats}/min

## 2016-04-11 LAB — ECHOCARDIOGRAM COMPLETE
Height: 58 in
Weight: 3454.4 oz

## 2016-04-11 LAB — TROPONIN I: Troponin I: <0.03 ng/mL (ref <0.03)

## 2016-04-11 MED ORDER — TECHNETIUM TC 99M TETROFOSMIN IV KIT
10.0000 | PACK | Freq: Once | INTRAVENOUS | Status: AC | PRN
  Administered 2016-04-11: 10 via INTRAVENOUS

## 2016-04-11 MED ORDER — ONDANSETRON HCL 4 MG/2ML IJ SOLN
INTRAMUSCULAR | Status: AC
  Filled 2016-04-11: qty 2

## 2016-04-11 MED ORDER — REGADENOSON 0.4 MG/5ML IV SOLN
0.4000 mg | Freq: Once | INTRAVENOUS | Status: AC
  Administered 2016-04-11: 0.4 mg via INTRAVENOUS
  Filled 2016-04-11: qty 5

## 2016-04-11 MED ORDER — TECHNETIUM TC 99M TETROFOSMIN IV KIT
30.0000 | PACK | Freq: Once | INTRAVENOUS | Status: AC | PRN
  Administered 2016-04-11: 30 via INTRAVENOUS

## 2016-04-11 MED ORDER — REGADENOSON 0.4 MG/5ML IV SOLN
INTRAVENOUS | Status: AC
  Administered 2016-04-11: 0.4 mg via INTRAVENOUS
  Filled 2016-04-11: qty 5

## 2016-04-11 NOTE — Discharge Summary (Signed)
Discharge Summary    Patient ID: Erin Ingram,  MRN: [id number], DOB/AGE: 77-Aug-1942 77 y.o.  Admit date: 04/10/2016 Discharge date: 04/11/2016  Primary Care Provider: [name] Primary Cardiologist: Dr. Irish Lack  Discharge Diagnoses    Active Problems:   Morbid obesity ([place])   Essential hypertension   Dyslipidemia   (HFpEF) heart failure with preserved ejection fraction (HCC)   CAD (coronary artery disease)   Chest pain   Unstable angina pectoris (HCC)   Allergies Allergies  Allergen Reactions  . Nsaids Other (See Comments)    Due to chronic kidney failure  . Stadol [Butorphanol] Other (See Comments)    agitation Constipation  . Sulfa Antibiotics Rash  . Bisoprolol Fumarate Other (See Comments)    Doesn't remember  . Butorphanol Tartrate Other (See Comments)    Produced a lot of urine, made patient feel crazy  . Codeine Nausea And Vomiting  . Demerol [Meperidine] Nausea And Vomiting  . Imipramine     Sweating, facial dysfunction   . Statins     MYALGIAS  . Tequin [Gatifloxacin] Other (See Comments)    Caused hypoglycemia Low blood sugar  . Brilinta [Ticagrelor] Rash    CAUSES PETECHIAE, PURPURA  . Pseudoephedrine Hcl Palpitations  . Septra [Sulfamethoxazole-Trimethoprim] Rash  . Sulfamethoxazole-Trimethoprim Rash    Diagnostic Studies/Procedures    Lexiscan 04/11/16 _____________   History of Present Illness     Erin Ingram is a 76 y.o. with past medical history significant for CAD status post CABG in 2000, hypertension, hyperlipidemia, sleep apnea using CPAP, polymyalgia rheumatica treated with prednisone and left subclavian stenosis who presents with left arm pain that radiates into her back since last night. We have been asked to evaluate this patient for unstable angina.  Erin Ingram has been having intermittent points of her left arm feeling like someone pushing her arm that lasts a few seconds since the night prior to  admission. She did have a more vigorous tai chi class. The morning of admission at approximately 7 AM she again developed intermittent left arm discomfort that was mild and on one occasion it became burning and radiated up over her shoulders and neck and over to her right arm. At about 720 she took sublingual nitroglycerin with relief but the discomfort again returned and she presented to the ED. She felt that the left arm discomfort is similar to her previous anginal symptoms but the moving up and over to the right arm was new. She has chronic dyspnea on exertion which had increased in December at which time related to an office visit she increased her Lasix to 40 mg for several days and her symptoms improved. She recently traveled out of state and again developed some shortness of breath for which she on her own increased her Lasix to 40 mg and found improvement. She has trace to 1+ pretibial pitting edema which she says is at her baseline.  EKG shows Sinus rhythm, 59 bpm, abnormal r wave progression, no acute ST/T changes. The first troponin is negative.   The patient has had persistent angina treated medically with Ranexa, metoprolol, amlodipine and isosorbide.  She is status post CABG in 2000. She had a heart cath in 11/2014 demonstrating 90% distal SVG-RCA stenosis which was treated with 2 overlapping Synergy drug-eluting stents. The saphenous vein graft to the OM 2 was patent there was a Y graft with a LIMA/LAD and SVG-diagonal and the LIMA portion was patent but SVG-diagonal was occluded. She  again had anginal symptoms and a relook cardiac cath in 12/2014 demonstrated patent stents in the vein graft to the RCA.   She again was admitted with chest pain in 08/2015 at which time her cardiac enzymes remained normal. Another LHC on 09/20/2015 showed unchanged coronary arteries but increased LVEDP, 33 mmHg, and diuresis with lasix was recommended.  She continues on Lasix 20 mg daily.  Her last echo on  01/01/2015 showed LVEF 55-60%, normal wall motion and MAC with trivial MR. Last myoview was in 03/2006.  Hospital Course     Consultants: None   She was admitted and underwent planned Lexiscan as she rule out with enzymes. Lexiscan was reviewed by Dr. Marlou Porch and not found to be high risk. Her Echo showed normal EF. She reported nor further episodes of chest pain.   She was seen by Dr. Marlou Porch and determined stable for discharge home. I have sent a staff message to arrange for follow up in the office with an APP. _____________  Discharge Vitals Blood pressure 131/69, pulse 72, temperature 97.8 F (36.6 C), temperature source Oral, resp. rate 17, height _0  (1.473 m), weight 215 lb 14.4 oz (97.9 kg), SpO2 98 %.  Filed Weights   04/10/16 1549 04/11/16 0500  Weight: 216 lb 9.6 oz (98.2 kg) 215 lb 14.4 oz (97.9 kg)    Labs & Radiologic Studies    CBC  Recent Labs  04/10/16 1005 04/10/16 1607  WBC 13.1* 12.5*  NEUTROABS 9.3*  --   HGB 10.7* 11.6*  HCT 33.5* 36.4  MCV 92.0 92.6  PLT 226 702   Basic Metabolic Panel  Recent Labs  04/10/16 1005 04/10/16 1607  NA 139  --   K 3.5  --   CL 105  --   CO2 23  --   GLUCOSE 86  --   BUN 23*  --   CREATININE 1.21* 1.10*  CALCIUM 9.0  --    Liver Function Tests No results for input(s): AST, ALT, ALKPHOS, BILITOT, PROT, ALBUMIN in the last 72 hours. No results for input(s): LIPASE, AMYLASE in the last 72 hours. Cardiac Enzymes  Recent Labs  04/10/16 1607 04/10/16 2020 04/11/16 0308  TROPONINI <0.03 0.03* <0.03   BNP Invalid input(s): POCBNP D-Dimer No results for input(s): DDIMER in the last 72 hours. Hemoglobin A1C No results for input(s): HGBA1C in the last 72 hours. Fasting Lipid Panel No results for input(s): CHOL, HDL, LDLCALC, TRIG, CHOLHDL, LDLDIRECT in the last 72 hours. Thyroid Function Tests No results for input(s): TSH, T4TOTAL, T3FREE, THYROIDAB in the last 72 hours.  Invalid input(s):  FREET3 _____________  Dg Chest Port 1 View  Result Date: 04/10/2016 CLINICAL DATA:  Intermittent left arm pain radiating to the back. Symptoms began yesterday. EXAM: PORTABLE CHEST 1 VIEW COMPARISON:  09/17/2015 FINDINGS: Previous median sternotomy and CABG. Mild cardiac enlargement. Aortic atherosclerosis. Venous hypertension without frank edema. No visible effusion. No acute bone finding. IMPRESSION: Cardiomegaly and venous hypertension.  Previous CABG. Electronically Signed   By: Nelson Chimes M.D.   On: 04/10/2016 10:27   Disposition   Pt is being discharged home today in good condition.  Follow-up Plans & Appointments    Follow-up Information    Larae Grooms, MD Follow up.   Specialties:  Cardiology, Radiology, Interventional Cardiology Why:  The office will call you with a follow up appt Contact information: [address] [place] [address] [phone]  Discharge Instructions    Diet - low sodium heart healthy    Complete by:  As directed    Increase activity slowly    Complete by:  As directed       Discharge Medications   Discharge Medication List as of 04/11/2016  2:22 PM    CONTINUE these medications which have NOT CHANGED   Details  amLODipine (NORVASC) 5 MG tablet TAKE 1 TABLET ONCE DAILY., Normal    aspirin 81 MG chewable tablet Chew 1 tablet (81 mg total) by mouth daily., Starting 12/20/2014, Until Discontinued, OTC    atorvastatin (LIPITOR) 10 MG tablet Take 10 mg by mouth daily., Until Discontinued, Historical Med    Calcium Glycerophosphate (PRELIEF PO) Take 1 tablet by mouth 3 (three) times daily as needed. , Historical Med    Cholecalciferol (VITAMIN D PO) Take 1 tablet by mouth daily., Until Discontinued, Historical Med    Cinnamon 500 MG TABS Take 1 each by mouth daily., Until Discontinued, Historical Med    clopidogrel (PLAVIX) 75 MG tablet Take 75 mg by mouth daily., Historical Med    Coenzyme Q10 10 MG  capsule Take 10 mg by mouth daily., Until Discontinued, Historical Med    fluocinonide cream (LIDEX) 2.45 % Apply 1 application topically 2 (two) times daily as needed (rash). , Until Discontinued, Historical Med    fluticasone (FLONASE) 50 MCG/ACT nasal spray Place 2 sprays into both nostrils daily as needed for allergies. , Until Discontinued, Historical Med    furosemide (LASIX) 20 MG tablet Take 1 tablet (20 mg total) by mouth 2 (two) times daily as needed., Starting Fri 03/13/2016, Normal    isosorbide mononitrate (IMDUR) 30 MG 24 hr tablet Take 1 tablet (30 mg total) by mouth at bedtime., Starting Tue 10/22/2015, Normal    levocetirizine (XYZAL) 5 MG tablet Take 5 mg by mouth every evening., Until Discontinued, Historical Med    levothyroxine (SYNTHROID, LEVOTHROID) 75 MCG tablet Take 75 mcg by mouth daily before breakfast. , Historical Med    Melatonin 1 MG TABS Take 1 mg by mouth at bedtime. Reported on 03/22/2015, Historical Med    metoprolol succinate (TOPROL-XL) 100 MG 24 hr tablet Take 1 tablet by mouth once a day, Normal    nitroGLYCERIN (NITROSTAT) 0.4 MG SL tablet Place 0.4 mg under the tongue every 5 (five) minutes as needed for chest pain. X 3 doses, Historical Med    NONFORMULARY OR COMPOUNDED ITEM [facility]:  [place] Tendonitis Cream - Diclofenac 3%, Baclofen 2%, Bupivacaine 1%, GAbapentin 6%, Ibuprofen 3%, Pentoxifylline 3%, apply 1-2 grams to affected area 3-4 times daily., Print    predniSONE (DELTASONE) 10 MG tablet Take 10 mg by mouth daily with breakfast. , Historical Med    Psyllium 400 MG CAPS Take 400 mg by mouth daily., Until Discontinued, Historical Med    RANEXA 1000 MG SR tablet Take 1 tablet by mouth  twice a day, Normal    venlafaxine XR (EFFEXOR-XR) 75 MG 24 hr capsule Take 225 mg by mouth daily with breakfast. Patient takes 3 capsules, Until Discontinued, Historical Med    mirabegron ER (MYRBETRIQ) 25 MG TB24 tablet Take 25 mg by mouth daily.,  Historical Med         Outstanding Labs/Studies   None  Duration of Discharge Encounter   Greater than 30 minutes including physician time.  Signed, Reino Bellis NP-C 04/11/2016, 3:18 PM  Personally seen and examined. Agree with above.  NUC stress test low risk ECHO  reassuring normal EF No further CP OK to DC home with continued aggressive medical mgt.   Candee Furbish, MD

## 2016-04-11 NOTE — Progress Notes (Signed)
Progress Note  Patient Name: Erin Ingram Date of Encounter: 04/11/2016  Primary Cardiologist: Dr. Irish Lack  Subjective   No chest pain.No shortness of breath  Inpatient Medications    Scheduled Meds: . amLODipine  5 mg Oral Daily  . aspirin  324 mg Oral Once  . aspirin  81 mg Oral Daily  . atorvastatin  10 mg Oral Daily  . clopidogrel  75 mg Oral Daily  . enoxaparin (LOVENOX) injection  40 mg Subcutaneous Q24H  . furosemide  20 mg Oral Daily  . isosorbide mononitrate  120 mg Oral Daily  . isosorbide mononitrate  30 mg Oral QHS  . levothyroxine  75 mcg Oral QAC breakfast  . metoprolol succinate  100 mg Oral QHS  . predniSONE  10 mg Oral Q breakfast  . ranolazine  1,000 mg Oral BID  . sodium chloride flush  3 mL Intravenous Q12H  . venlafaxine XR  225 mg Oral Q breakfast   Continuous Infusions:  PRN Meds: sodium chloride, acetaminophen, nitroGLYCERIN, ondansetron (ZOFRAN) IV, sodium chloride flush   Vital Signs    Vitals:   04/11/16 0500 04/11/16 0914 04/11/16 0921 04/11/16 0922  BP: (!) 113/56 (!) 135/53 120/75 (!) 151/66  Pulse: 65 60 89 69  Resp: 18     Temp: 98 F (36.7 C)     TempSrc: Oral     SpO2: 94%     Weight: 215 lb 14.4 oz (97.9 kg)     Height:        Intake/Output Summary (Last 24 hours) at 04/11/16 0925 Last data filed at 04/10/16 2300  Gross per 24 hour  Intake              480 ml  Output                0 ml  Net              480 ml   Filed Weights   04/10/16 1549 04/11/16 0500  Weight: 216 lb 9.6 oz (98.2 kg) 215 lb 14.4 oz (97.9 kg)    Telemetry    SR - Personally Reviewed  ECG    SR abnormal r wave progression, no acute ST/T changes - Personally Reviewed  Physical Exam   GEN: No acute distress.  Neck: No JVD Cardiac: RRR, no murmurs, rubs, or gallops.  Respiratory: Clear to auscultation bilaterally. GI: Soft, nontender, non-distended  MS: No edema; No deformity. Neuro:  AAOx3. Psych: Normal affect  Labs      Chemistry Recent Labs Lab 04/10/16 1005 04/10/16 1607  NA 139  --   K 3.5  --   CL 105  --   CO2 23  --   GLUCOSE 86  --   BUN 23*  --   CREATININE 1.21* 1.10*  CALCIUM 9.0  --   GFRNONAA 42* 48*  GFRAA 49* 55*  ANIONGAP 11  --      Hematology Recent Labs Lab 04/10/16 1005 04/10/16 1607  WBC 13.1* 12.5*  RBC 3.64* 3.93  HGB 10.7* 11.6*  HCT 33.5* 36.4  MCV 92.0 92.6  MCH 29.4 29.5  MCHC 31.9 31.9  RDW 14.7 14.7  PLT 226 245    Cardiac Enzymes Recent Labs Lab 04/10/16 1607 04/10/16 2020 04/11/16 0308  TROPONINI <0.03 0.03* <0.03    Recent Labs Lab 04/10/16 1009  TROPIPOC 0.00     BNPNo results for input(s): BNP, PROBNP in the last 168 hours.   DDimer  No results for input(s): DDIMER in the last 168 hours.   Radiology    Dg Chest Port 1 View  Result Date: 04/10/2016 CLINICAL DATA:  Intermittent left arm pain radiating to the back. Symptoms began yesterday. EXAM: PORTABLE CHEST 1 VIEW COMPARISON:  09/17/2015 FINDINGS: Previous median sternotomy and CABG. Mild cardiac enlargement. Aortic atherosclerosis. Venous hypertension without frank edema. No visible effusion. No acute bone finding. IMPRESSION: Cardiomegaly and venous hypertension.  Previous CABG. Electronically Signed   By: Nelson Chimes M.D.   On: 04/10/2016 10:27    Cardiac Studies   Lexiscan: pending  Patient Profile     77 y.o. female past medical history significant for CAD status post CABG in 2000, hypertension, hyperlipidemia, sleep apnea using CPAP, polymyalgia rheumatica treated with prednisone and left subclavian stenosis who presents with left arm pain that radiates into her back since last night. Planned for Lexiscan today.  Assessment & Plan    1. Chest pain- unstable angina -Patient with left arm points of discomfort, a mild pushing feeling, similar to her previous anginal symptoms. Breathing is at her baseline with no recent increase in dyspnea on exertion. No substernal  discomfort -EKG shows Sinus rhythm nonacute. Trop negx3 -Patient has previous history of CABG in 2000 and stents to the RCA graft in 2016. Repeat cath in 08/2015 with no acute findings.  -Considering patient's previous history will admit and monitor, cycle troponins, check echo for wall motion and LV function, planned for stress test today. Images pending. -Continue her medical therapy for angina including isosorbide (60 mg bid and 30 mg at hs), Ranexa (1000 mg bid), beta blocker and nitroglycerin as needed  2. CAD status post CABG and stents to saphenous vein graft to the RCA -Continue Plavix (patient had a reaction to Brilinta in there past with itching) -Continue aspirin, statin, beta blocker, isosorbide, Ranexa  3. Heart failure with preserved ejection fraction -Last Echo in 2016 shoed normal EF 55-60% -Cath in 08/2015 showed elevated LVEDP of 33 mm Hg -Has been on lasix 20 mg daily. Creatinine is 1.10, was 1.26 in 12/2015 and 1.46 and 09/2015. We'll continue Lasix. -Patient does not appear significantly volume overloaded  4. Hypertension -Blood pressure stable, continue beta blocker, amlodipine, isosorbide, and Lasix  5 hyperlipidemia -LDL 66 on 09/18/2015 -Continue Lipitor  6 obstructive sleep apnea -Continue CPAP at at bedtime  7 poly-myalgia rheumatica -Continue prednisone  8 anemia -Hemoglobin is 10.7 and appears to be her baseline  Signed, Reino Bellis, NP  04/11/2016, 9:25 AM    Personally seen and examined. Agree with above. Echocardiogram reassuring with normal ejection fraction Third troponin was back to normal. Continue with medical therapy and discharge home.  Candee Furbish, MD

## 2016-04-11 NOTE — Progress Notes (Signed)
  Echocardiogram 2D Echocardiogram has been performed.  Darlina Sicilian M 04/11/2016, 11:45 AM

## 2016-04-13 ENCOUNTER — Ambulatory Visit: Payer: Medicare Other | Admitting: Interventional Cardiology

## 2016-04-15 ENCOUNTER — Ambulatory Visit (INDEPENDENT_AMBULATORY_CARE_PROVIDER_SITE_OTHER): Payer: Medicare Other | Admitting: Podiatry

## 2016-04-15 DIAGNOSIS — M2170 Unequal limb length (acquired), unspecified site: Secondary | ICD-10-CM

## 2016-04-26 NOTE — Progress Notes (Signed)
   Subjective:  Patient presents today for follow-up evaluation of plantar fasciitis to the right foot. Patient states that her plantar fasciitis is resolved and she no longer experiences pain. Patient does have a new concern today for possible limb length discrepancy. Patient believes that she was walking more rectus and balanced with the boot. Patient believes this may be due to a limb length discrepancy. Patient denies trauma to her bilateral hips and knees.    Objective/Physical Exam General: The patient is alert and oriented x3 in no acute distress.  Dermatology: Skin is warm, dry and supple bilateral lower extremities. Negative for open lesions or macerations.  Vascular: Palpable pedal pulses bilaterally. No edema or erythema noted. Capillary refill within normal limits.  Neurological: Epicritic and protective threshold grossly intact bilaterally.   Musculoskeletal Exam: Range of motion within normal limits to all pedal and ankle joints bilateral. Muscle strength 5/5 in all groups bilateral.  There does appear to be an approximately 5 mm limb length discrepancy with the left lower extremity shorter than the right.  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 #2 today were going to recommend felt pad heel lifts to be placed in her shoe to accommodate for limb length discrepancy. #3 return to clinic when necessary   Edrick Kins, DPM [facility]  Dr. Edrick Kins, [address]                                        [place], [place] [address]                Office [phone]  Fax [phone]

## 2016-04-29 ENCOUNTER — Other Ambulatory Visit: Payer: Self-pay | Admitting: Interventional Cardiology

## 2016-04-30 ENCOUNTER — Ambulatory Visit: Payer: Medicare Other | Admitting: Physician Assistant

## 2016-04-30 ENCOUNTER — Encounter: Payer: Self-pay | Admitting: Physician Assistant

## 2016-04-30 NOTE — Progress Notes (Addendum)
Cardiology Office Note    Date:  05/01/2016  ID:  Ingram, Erin [birth year], MRN [id number] PCP:  Erin Pac, MD  Cardiologist:  Dr. Irish Lack   Chief Complaint: f/u chest pain  History of Present Illness:  Erin Ingram is a 76 y.o. female with history of CAD (CABG 2000, DES x2 to SVG-RCA), hypertension, hyperlipidemia, sleep apnea using CPAP, chronic diastolic CHF, morbid obesity, polymyalgia rheumatica treated with prednisone, CKD III, left subclavian stenosis, prior TIA who presents for post-hospital follow-up.   The patient has had persistent angina treated medically with Ranexa, metoprolol, amlodipine and isosorbide. She has required several heart caths since that time with PCI as above. More recent cath 08/2015 showed unchanged coronaries (with occluded SVG-diag) but LVEDP 33 requiring diuresis. She was admitted 03/2016 with intermittent points of left arm pain and well as SOB. She had self-increased her Lasix temporarily. Her troponins remained negative. She underwent Lexiscan showing medium-large fixed defect involving mid-basilar portion of septum, EF 60%. 2D echo 04/11/16: EF 55-60%, grade 1 DD, calcified mitral annulus, mildly dilated LA. Carotid duplex 2017: no sig stenosis. Last labs showed Hgb 11.6, WBC 12.5, BUN 23, Cr 1.23 (baseline appears 1-1.4).  She presents for follow-up today. She has not had any further chest pain. She had a mild fever and cold/congestion 3 weeks ago and has felt some continued post-nasal drainage since that time, but overall it is improving. She wants to make sure her lungs sound good today. She is back to taking Lasix 20mg  daily, with 1 extra tablet daily if needed. She is in the process of reducing her prednisone with her provider that manages this.   Past Medical History:  Diagnosis Date  . (HFpEF) heart failure with preserved ejection fraction (HCC)    a. normal EF, LVEDP at [facility] in 6/17 33 >> Lasix started   . Anemia   . Arthritis    "fingers, back, shoulders, hips" (04/10/2016)  . Chronic diastolic CHF (congestive heart failure) ([place])   . CKD (chronic kidney disease), stage III   . Coronary artery disease    a. s/p CABG 2000 (SVG/ free LIMA Y graft to the diagonal and distal LAD, SVG to the OM 1, and SVG to the PDA) . b. Cath 12/19/2014 90% dSVG to RCA s/p 2 overlapping DES. c. LHC 2016, 2017 no intervention except diuresis needed. d. Low risk nuc 03/2016.  . Depression    with anxious component  . GERD (gastroesophageal reflux disease)   . History of hiatal hernia   . Hyperlipidemia   . Hypothyroidism   . Obesity   . OSA on CPAP   . PMR (polymyalgia rheumatica) (HCC)   . Pneumonia    "2-3 times" (04/10/2016)  . Stable angina (HCC)    microvascular, improved with Ranexa  . Subclavian artery stenosis ([place])   . TIA (transient ischemic attack)     Past Surgical History:  Procedure Laterality Date  . BREAST BIOPSY Right 1964  . CARDIAC CATHETERIZATION  08   patent grafts, no culprit lesions, EF 65%  . CARDIAC CATHETERIZATION N/A 12/19/2014   Procedure: Left Heart Cath and Cors/Grafts Angiography;  Surgeon: Jettie Booze, MD;  Location: [place] [facility];  Service: Cardiovascular;  Laterality: N/A;  . CARDIAC CATHETERIZATION N/A 12/19/2014   Procedure: Coronary Stent Intervention;  Surgeon: Jettie Booze, MD;  Location: [place] [facility];  Service: Cardiovascular;  Laterality: N/A;  . CARDIAC CATHETERIZATION N/A 01/01/2015   Procedure: Left Heart  Cath and Cors/Grafts Angiography;  Surgeon: Jettie Booze, MD;  Location: [place] [facility];  Service: Cardiovascular;  Laterality: N/A;  . CARDIAC CATHETERIZATION N/A 09/20/2015   Procedure: Left Heart Cath and Cors/Grafts Angiography;  Surgeon: Jettie Booze, MD;  Location: [place] [facility];  Service: Cardiovascular;  Laterality: N/A;  . CATARACT EXTRACTION W/ INTRAOCULAR LENS  IMPLANT, BILATERAL Bilateral 2011  . CORONARY ARTERY BYPASS GRAFT  2000    ASCVD, multivessel, S./P.  . DILATION AND CURETTAGE OF UTERUS  1980s  . FRACTURE SURGERY    . PATELLA FRACTURE SURGERY Left 1993    Current Medications: Current Outpatient Prescriptions  Medication Sig Dispense Refill  . amLODipine (NORVASC) 5 MG tablet TAKE 1 TABLET ONCE DAILY. 30 tablet 10  . aspirin 81 MG chewable tablet Chew 1 tablet (81 mg total) by mouth daily.    Marland Kitchen atorvastatin (LIPITOR) 10 MG tablet Take 10 mg by mouth daily.    . Calcium Glycerophosphate (PRELIEF PO) Take 1 tablet by mouth 3 (three) times daily as needed.     . Cholecalciferol (VITAMIN D PO) Take 1 tablet by mouth daily.    . Cinnamon 500 MG TABS Take 1 each by mouth daily.    . clopidogrel (PLAVIX) 75 MG tablet Take 75 mg by mouth daily.    . Coenzyme Q10 10 MG capsule Take 10 mg by mouth daily.    . fluocinonide cream (LIDEX) [id number] % Apply 1 application topically 2 (two) times daily as needed (rash).     . fluticasone (FLONASE) 50 MCG/ACT nasal spray Place 2 sprays into both nostrils daily as needed for allergies.     . furosemide (LASIX) 20 MG tablet Take 1 tablet (20 mg total) by mouth 2 (two) times daily as needed. (Patient taking differently: Take 20 mg by mouth daily as needed. ) 180 tablet 3  . isosorbide mononitrate (IMDUR) 30 MG 24 hr tablet TAKE ONE TABLET AT BEDTIME. 30 tablet 0  . levocetirizine (XYZAL) 5 MG tablet Take 5 mg by mouth every evening.    Marland Kitchen levothyroxine (SYNTHROID, LEVOTHROID) 75 MCG tablet Take 75 mcg by mouth daily before breakfast.     . Melatonin 1 MG TABS Take 1 mg by mouth at bedtime. Reported on 03/22/2015    . metoprolol succinate (TOPROL-XL) 100 MG 24 hr tablet Take 1 tablet by mouth once a day (Patient taking differently: Take 1 tablet by mouth at bedtime) 90 tablet 1  . mirabegron ER (MYRBETRIQ) 25 MG TB24 tablet Take 25 mg by mouth daily.    . nitroGLYCERIN (NITROSTAT) 0.4 MG SL tablet Place 0.4 mg under the tongue every 5 (five) minutes as needed for chest pain. X 3 doses     . NONFORMULARY OR COMPOUNDED ITEM [facility]:  Achilles Tendonitis Cream - Diclofenac 3%, Baclofen 2%, Bupivacaine 1%, GAbapentin 6%, Ibuprofen 3%, Pentoxifylline 3%, apply 1-2 grams to affected area 3-4 times daily. (Patient taking differently: Apply 1 application topically daily as needed. [facility]:  Achilles Tendonitis Cream - Diclofenac 3%, Baclofen 2%, Bupivacaine 1%, GAbapentin 6%, Ibuprofen 3%, Pentoxifylline 3%, apply 1-2 grams to affected area 3-4 times daily.) 120 each 2  . predniSONE (DELTASONE) 10 MG tablet Take 10 mg by mouth daily with breakfast.     . Psyllium 400 MG CAPS Take 400 mg by mouth daily.    Marland Kitchen RANEXA 1000 MG SR tablet Take 1 tablet by mouth  twice a day 180 tablet 2  . venlafaxine XR (EFFEXOR-XR)  75 MG 24 hr capsule Take 225 mg by mouth daily with breakfast. Patient takes 3 capsules     Current Facility-Administered Medications  Medication Dose Route Frequency Provider Last Rate Last Dose  . betamethasone acetate-betamethasone sodium phosphate (CELESTONE) injection 3 mg  3 mg Intramuscular Once Edrick Kins, DPM         Allergies:   Nsaids; Stadol [butorphanol]; Sulfa antibiotics; Bisoprolol fumarate; Butorphanol tartrate; Codeine; Demerol [meperidine]; Imipramine; Statins; Tequin [gatifloxacin]; Brilinta [ticagrelor]; Pseudoephedrine hcl; Septra [sulfamethoxazole-trimethoprim]; and Sulfamethoxazole-trimethoprim   Social History   Social History  . Marital status: Married    Spouse name: N/A  . Number of children: 2  . Years of education: College   Occupational History  . Retired Cytogeneticist    Social History Main Topics  . Smoking status: Former Smoker    Packs/day: 0.50    Years: 7.00    Types: Cigarettes    Quit date: 08/21/1969  . Smokeless tobacco: Never Used  . Alcohol use 0.0 oz/week     Comment: 04/10/2016 "Glass of wine 2-3 x year"  . Drug use: No  . Sexual activity: No   Other Topics Concern  . None   Social History  Narrative   Lives in [place] with husband.    Right-handed.   1 cup caffeine per day.     Family History:  The patient's family history includes Diabetes in her brother; Heart attack in her father and mother; Heart disease in her father and mother; Stroke in her paternal grandmother.   ROS:   Please see the history of present illness.  All other systems are reviewed and otherwise negative.    PHYSICAL EXAM:   VS:  BP 132/64   Pulse 76   Ht 4\' 10"  (1.473 m)   Wt 217 lb (98.4 kg)   BMI 45.35 kg/m   BMI: Body mass index is 45.35 kg/m. GEN: Well nourished, well developed obese WF, in no acute distress  HEENT: normocephalic, atraumatic Neck: no JVD, carotid bruits, or masses Cardiac: RRR; no murmurs, rubs, or gallops, no edema  Respiratory:  clear to auscultation bilaterally, normal work of breathing GI: soft, nontender, nondistended, + BS MS: kyphosis noted Skin: warm and dry, no rash Neuro:  Alert and Oriented x 3, Strength and sensation are intact, follows commands Psych: euthymic mood, full affect  Wt Readings from Last 3 Encounters:  05/01/16 217 lb (98.4 kg)  04/11/16 215 lb 14.4 oz (97.9 kg)  03/13/16 222 lb (100.7 kg)      Studies/Labs Reviewed:   EKG: EKG was not ordered today.  Recent Labs: 09/18/2015: TSH 3.616 01/07/2016: ALT 14 04/10/2016: BUN 23; Creatinine, Ser 1.10; Hemoglobin 11.6; Platelets 245; Potassium 3.5; Sodium 139   Lipid Panel    Component Value Date/Time   CHOL 163 09/18/2015 0400   TRIG 155 (H) 09/18/2015 0400   HDL 66 09/18/2015 0400   CHOLHDL 2.5 09/18/2015 0400   VLDL 31 09/18/2015 0400   LDLCALC 66 09/18/2015 0400    Additional studies/ records that were reviewed today include: Summarized above.    ASSESSMENT & PLAN:   1. Precordial CP - resolved, low risk nuc. Continue medical therapy. 2. CAD - no recurrent CP. Continue ASA, BB, statin, Ranexa, Imdur. 3. Essential HTN - continue current regimen. This was generally  controlled in the hospital recently as well. 4. Chronic diastolic CHF - she wanted some clarification on this so I explained to her about her heart function and  stiffness. Reviewed 2g sodium diet, 2L fluid restriction, importance of long-term weight loss and physical activity. She feels her volume is stable. Continue Lasix 20mg  daily with 1 extra tablet daily as needed. 5. Post-nasal drip - still with mild residual cough and some fatigue. However, she feels that she is improving. Lungs are completely clear. Recommend continued supportive care. F/u with primary care if this does not completely resolve. 6. Leukocytosis - noted on recent labs. She is on chronic prednisone which could be contributing. She also reports this was worked up in the past with benign etiology even when she was not on steroids.  Disposition: F/u in 6 months. She requests to do so with me.   Medication Adjustments/Labs and Tests Ordered: Current medicines are reviewed at length with the patient today.  Concerns regarding medicines are outlined above. Medication changes, Labs and Tests ordered today are summarized above and listed in the Patient Instructions accessible in Encounters.   Raechel Ache PA-C  05/01/2016 11:25 AM    [address], [place], [place]  [address] Phone: [phone]; Fax: [phone]

## 2016-05-01 ENCOUNTER — Ambulatory Visit (INDEPENDENT_AMBULATORY_CARE_PROVIDER_SITE_OTHER): Payer: Medicare Other | Admitting: Physician Assistant

## 2016-05-01 ENCOUNTER — Encounter: Payer: Self-pay | Admitting: Physician Assistant

## 2016-05-01 VITALS — BP 132/64 | HR 76 | Ht <= 58 in | Wt 217.0 lb

## 2016-05-01 DIAGNOSIS — I2510 Atherosclerotic heart disease of native coronary artery without angina pectoris: Secondary | ICD-10-CM

## 2016-05-01 DIAGNOSIS — I10 Essential (primary) hypertension: Secondary | ICD-10-CM | POA: Diagnosis not present

## 2016-05-01 DIAGNOSIS — I5032 Chronic diastolic (congestive) heart failure: Secondary | ICD-10-CM

## 2016-05-01 DIAGNOSIS — D72829 Elevated white blood cell count, unspecified: Secondary | ICD-10-CM

## 2016-05-01 DIAGNOSIS — R072 Precordial pain: Secondary | ICD-10-CM | POA: Diagnosis not present

## 2016-05-01 DIAGNOSIS — R0982 Postnasal drip: Secondary | ICD-10-CM

## 2016-05-01 NOTE — Addendum Note (Signed)
Addended by: Charlie Pitter on: 05/01/2016 12:15 PM   Modules accepted: Level of Service

## 2016-05-01 NOTE — Patient Instructions (Addendum)
Medication Instructions:  Your physician recommends that you continue on your current medications as directed. Please refer to the Current Medication list given to you today.   Labwork: None ordered  Testing/Procedures: None ordered  Follow-Up: Your physician wants you to follow-up in: [place], PA-C  You will receive a reminder letter in the mail two months in advance. If you don't receive a letter, please call our office to schedule the follow-up appointment.    Any Other Special Instructions Will Be Listed Below (If Applicable).    If you need a refill on your cardiac medications before your next appointment, please call your pharmacy.

## 2016-05-22 ENCOUNTER — Other Ambulatory Visit: Payer: Self-pay | Admitting: Interventional Cardiology

## 2016-06-08 ENCOUNTER — Other Ambulatory Visit: Payer: Self-pay | Admitting: Gastroenterology

## 2016-06-08 DIAGNOSIS — R1310 Dysphagia, unspecified: Secondary | ICD-10-CM

## 2016-06-08 DIAGNOSIS — R1319 Other dysphagia: Secondary | ICD-10-CM

## 2016-06-10 ENCOUNTER — Telehealth: Payer: Self-pay

## 2016-06-10 NOTE — Telephone Encounter (Signed)
Request for surgical clearance:  1. What type of surgery is being performed? Colonoscopy/endoscopy   2. When is this surgery scheduled? 07/27/16   3. Are there any medications that need to be held prior to surgery and how long? Plavix   4. Name of physician performing surgery? Dr. Michail Sermon   5. What is your office phone and fax number? Phone-[phone], Fax- [phone]

## 2016-06-10 NOTE — Telephone Encounter (Signed)
Antiplatelet clearance per MD recommendation.

## 2016-06-11 NOTE — Telephone Encounter (Signed)
OK to hold Plavix 5 days prior to endoscopy.

## 2016-06-12 ENCOUNTER — Ambulatory Visit
Admission: RE | Admit: 2016-06-12 | Discharge: 2016-06-12 | Disposition: A | Discharge status: Home / Self Care | Payer: Medicare Other | Source: Ambulatory Visit | Attending: Gastroenterology | Admitting: Gastroenterology

## 2016-06-12 DIAGNOSIS — R1319 Other dysphagia: Secondary | ICD-10-CM

## 2016-06-12 DIAGNOSIS — R1310 Dysphagia, unspecified: Secondary | ICD-10-CM

## 2016-06-12 NOTE — Telephone Encounter (Signed)
Clearance faxed and confirmation received.

## 2016-06-22 ENCOUNTER — Other Ambulatory Visit: Payer: Self-pay | Admitting: Interventional Cardiology

## 2016-07-06 ENCOUNTER — Other Ambulatory Visit: Payer: Self-pay | Admitting: Interventional Cardiology

## 2016-07-06 MED ORDER — METOPROLOL SUCCINATE ER 100 MG PO TB24: 100.0000 mg | ORAL_TABLET | Freq: Every day | ORAL | 0 refills | Status: DC

## 2016-07-22 ENCOUNTER — Other Ambulatory Visit: Payer: Self-pay | Admitting: Gastroenterology

## 2016-07-23 ENCOUNTER — Encounter (HOSPITAL_COMMUNITY): Payer: Self-pay | Admitting: *Deleted

## 2016-07-23 NOTE — Progress Notes (Signed)
Pt denies any acute cardiopulmonary issues. Pt stated that her last dose of Plavix was Tuesday, 07/21/16 (per MD/ per pt). Spoke with Lenna Sciara, CMA, to have MD clarify Aspirin pre-op instructions.; Melissa advised that pt is to continue taking Aspirin but hold it the morning of procedure. Pt made aware to stop taking vitamins, fish oil, Cinnamon, CO Q 10, Melatonin and herbal medications. Do not take any NSAIDs ie: Ibuprofen, Advil, Naproxen, BC and Goody Powder. Pt verbalized understanding of all pre-op instructions.

## 2016-07-27 ENCOUNTER — Ambulatory Visit (HOSPITAL_COMMUNITY): Payer: Medicare Other | Admitting: Anesthesiology

## 2016-07-27 ENCOUNTER — Encounter (HOSPITAL_COMMUNITY)
Admission: RE | Disposition: A | Discharge status: Home / Self Care | Payer: Self-pay | Source: Ambulatory Visit | Attending: Gastroenterology

## 2016-07-27 ENCOUNTER — Encounter (HOSPITAL_COMMUNITY): Payer: Self-pay | Admitting: Anesthesiology

## 2016-07-27 ENCOUNTER — Ambulatory Visit (HOSPITAL_COMMUNITY)
Admission: RE | Admit: 2016-07-27 | Discharge: 2016-07-27 | Disposition: A | Discharge status: Home / Self Care | Payer: Medicare Other | Source: Ambulatory Visit | Attending: Gastroenterology | Admitting: Gastroenterology

## 2016-07-27 DIAGNOSIS — D124 Benign neoplasm of descending colon: Secondary | ICD-10-CM | POA: Diagnosis not present

## 2016-07-27 DIAGNOSIS — K648 Other hemorrhoids: Secondary | ICD-10-CM | POA: Diagnosis not present

## 2016-07-27 DIAGNOSIS — N183 Chronic kidney disease, stage 3 (moderate): Secondary | ICD-10-CM | POA: Insufficient documentation

## 2016-07-27 DIAGNOSIS — I739 Peripheral vascular disease, unspecified: Secondary | ICD-10-CM | POA: Insufficient documentation

## 2016-07-27 DIAGNOSIS — Z79899 Other long term (current) drug therapy: Secondary | ICD-10-CM | POA: Insufficient documentation

## 2016-07-27 DIAGNOSIS — Z7982 Long term (current) use of aspirin: Secondary | ICD-10-CM | POA: Insufficient documentation

## 2016-07-27 DIAGNOSIS — Z8673 Personal history of transient ischemic attack (TIA), and cerebral infarction without residual deficits: Secondary | ICD-10-CM | POA: Diagnosis not present

## 2016-07-27 DIAGNOSIS — R1310 Dysphagia, unspecified: Secondary | ICD-10-CM | POA: Diagnosis present

## 2016-07-27 DIAGNOSIS — K2900 Acute gastritis without bleeding: Secondary | ICD-10-CM | POA: Diagnosis not present

## 2016-07-27 DIAGNOSIS — D122 Benign neoplasm of ascending colon: Secondary | ICD-10-CM | POA: Diagnosis not present

## 2016-07-27 DIAGNOSIS — Z6841 Body Mass Index (BMI) 40.0 and over, adult: Secondary | ICD-10-CM | POA: Diagnosis not present

## 2016-07-27 DIAGNOSIS — E669 Obesity, unspecified: Secondary | ICD-10-CM | POA: Insufficient documentation

## 2016-07-27 DIAGNOSIS — M1990 Unspecified osteoarthritis, unspecified site: Secondary | ICD-10-CM | POA: Diagnosis not present

## 2016-07-27 DIAGNOSIS — Z87891 Personal history of nicotine dependence: Secondary | ICD-10-CM | POA: Diagnosis not present

## 2016-07-27 DIAGNOSIS — D120 Benign neoplasm of cecum: Secondary | ICD-10-CM | POA: Diagnosis not present

## 2016-07-27 DIAGNOSIS — Z951 Presence of aortocoronary bypass graft: Secondary | ICD-10-CM | POA: Insufficient documentation

## 2016-07-27 DIAGNOSIS — I129 Hypertensive chronic kidney disease with stage 1 through stage 4 chronic kidney disease, or unspecified chronic kidney disease: Secondary | ICD-10-CM | POA: Insufficient documentation

## 2016-07-27 DIAGNOSIS — I2510 Atherosclerotic heart disease of native coronary artery without angina pectoris: Secondary | ICD-10-CM | POA: Insufficient documentation

## 2016-07-27 DIAGNOSIS — K219 Gastro-esophageal reflux disease without esophagitis: Secondary | ICD-10-CM | POA: Diagnosis not present

## 2016-07-27 DIAGNOSIS — G4730 Sleep apnea, unspecified: Secondary | ICD-10-CM | POA: Diagnosis not present

## 2016-07-27 DIAGNOSIS — F329 Major depressive disorder, single episode, unspecified: Secondary | ICD-10-CM | POA: Diagnosis not present

## 2016-07-27 DIAGNOSIS — D125 Benign neoplasm of sigmoid colon: Secondary | ICD-10-CM | POA: Insufficient documentation

## 2016-07-27 DIAGNOSIS — K5730 Diverticulosis of large intestine without perforation or abscess without bleeding: Secondary | ICD-10-CM | POA: Diagnosis not present

## 2016-07-27 DIAGNOSIS — E785 Hyperlipidemia, unspecified: Secondary | ICD-10-CM | POA: Diagnosis not present

## 2016-07-27 DIAGNOSIS — E039 Hypothyroidism, unspecified: Secondary | ICD-10-CM | POA: Diagnosis not present

## 2016-07-27 DIAGNOSIS — Z7902 Long term (current) use of antithrombotics/antiplatelets: Secondary | ICD-10-CM | POA: Insufficient documentation

## 2016-07-27 DIAGNOSIS — K580 Irritable bowel syndrome with diarrhea: Secondary | ICD-10-CM | POA: Diagnosis present

## 2016-07-27 DIAGNOSIS — Z7952 Long term (current) use of systemic steroids: Secondary | ICD-10-CM | POA: Insufficient documentation

## 2016-07-27 DIAGNOSIS — Z8701 Personal history of pneumonia (recurrent): Secondary | ICD-10-CM | POA: Diagnosis not present

## 2016-07-27 HISTORY — PX: ESOPHAGOGASTRODUODENOSCOPY (EGD) WITH PROPOFOL: SHX5813

## 2016-07-27 HISTORY — PX: COLONOSCOPY WITH PROPOFOL: SHX5780

## 2016-07-27 SURGERY — ESOPHAGOGASTRODUODENOSCOPY (EGD) WITH PROPOFOL
Anesthesia: Monitor Anesthesia Care

## 2016-07-27 MED ORDER — SODIUM CHLORIDE 0.9 % IV SOLN
INTRAVENOUS | Status: DC
  Administered 2016-07-27 (×2): via INTRAVENOUS

## 2016-07-27 MED ORDER — METOPROLOL TARTRATE 5 MG/5ML IV SOLN
INTRAVENOUS | Status: DC | PRN
  Administered 2016-07-27: 2.5 mg via INTRAVENOUS

## 2016-07-27 MED ORDER — PROPOFOL 10 MG/ML IV BOLUS
INTRAVENOUS | Status: DC | PRN
  Administered 2016-07-27: 20 mg via INTRAVENOUS

## 2016-07-27 MED ORDER — PHENYLEPHRINE HCL 10 MG/ML IJ SOLN
INTRAMUSCULAR | Status: DC | PRN
  Administered 2016-07-27 (×6): 80 ug via INTRAVENOUS

## 2016-07-27 MED ORDER — PHENYLEPHRINE HCL 10 MG/ML IJ SOLN
INTRAMUSCULAR | Status: DC | PRN
  Administered 2016-07-27: 50 ug/min via INTRAVENOUS

## 2016-07-27 MED ORDER — PROPOFOL 500 MG/50ML IV EMUL
INTRAVENOUS | Status: DC | PRN
  Administered 2016-07-27: 75 ug/kg/min via INTRAVENOUS

## 2016-07-27 MED ORDER — BUTAMBEN-TETRACAINE-BENZOCAINE 2-2-14 % EX AERO
INHALATION_SPRAY | CUTANEOUS | Status: DC | PRN
  Administered 2016-07-27: 2 via TOPICAL

## 2016-07-27 SURGICAL SUPPLY — 24 items

## 2016-07-27 NOTE — Transfer of Care (Signed)
Immediate Anesthesia Transfer of Care Note  Patient: Erin Ingram  Procedure(s) Performed: Procedure(s): ESOPHAGOGASTRODUODENOSCOPY (EGD) WITH PROPOFOL (N/A) SAVORY DILATION (N/A) COLONOSCOPY WITH PROPOFOL (N/A)  Patient Location: Endoscopy Unit  Anesthesia Type:MAC  Level of Consciousness: awake, oriented and patient cooperative  Airway & Oxygen Therapy: Patient Spontanous Breathing and Patient connected to nasal cannula oxygen  Post-op Assessment: Report given to RN and Post -op Vital signs reviewed and stable  Post vital signs: Reviewed  Last Vitals:  Vitals:   07/27/16 0748 07/27/16 1020  BP: (!) 123/40 116/72  Pulse: 71 87  Resp: 16 11    Last Pain:  Vitals:   07/27/16 0748  TempSrc: Oral         Complications: No apparent anesthesia complications

## 2016-07-27 NOTE — Anesthesia Procedure Notes (Signed)
Procedure Name: MAC Date/Time: 07/27/2016 8:38 AM Performed by: Jenne Campus Pre-anesthesia Checklist: Patient identified, Emergency Drugs available, Suction available, Patient being monitored and Timeout performed Oxygen Delivery Method: Nasal cannula

## 2016-07-27 NOTE — Anesthesia Preprocedure Evaluation (Addendum)
Anesthesia Evaluation  Patient identified by MRN, date of birth, ID band Patient awake    Reviewed: Allergy & Precautions, NPO status , Patient's Chart, lab work & pertinent test results, reviewed documented beta blocker date and time   Airway Mallampati: II  TM Distance: >3 FB Neck ROM: Full    Dental  (+) Teeth Intact, Dental Advisory Given   Pulmonary sleep apnea and Continuous Positive Airway Pressure Ventilation , former smoker,    breath sounds clear to auscultation       Cardiovascular hypertension, Pt. on medications and Pt. on home beta blockers + CAD, + CABG, + Peripheral Vascular Disease and +CHF   Rhythm:Regular Rate:Normal     Neuro/Psych PSYCHIATRIC DISORDERS Depression TIA Neuromuscular disease    GI/Hepatic Neg liver ROS, hiatal hernia, GERD  ,  Endo/Other  Hypothyroidism   Renal/GU CRFRenal disease  negative genitourinary   Musculoskeletal  (+) Arthritis , Osteoarthritis,    Abdominal   Peds negative pediatric ROS (+)  Hematology negative hematology ROS (+)   Anesthesia Other Findings   Reproductive/Obstetrics negative OB ROS                           Anesthesia Physical Anesthesia Plan  ASA: III  Anesthesia Plan: MAC   Post-op Pain Management:    Induction: Intravenous  Airway Management Planned: Natural Airway  Additional Equipment:   Intra-op Plan:   Post-operative Plan:   Informed Consent: I have reviewed the patients History and Physical, chart, labs and discussed the procedure including the risks, benefits and alternatives for the proposed anesthesia with the patient or authorized representative who has indicated his/her understanding and acceptance.     Plan Discussed with: CRNA  Anesthesia Plan Comments:         Anesthesia Quick Evaluation

## 2016-07-27 NOTE — H&P (Signed)
Date of Initial H&P: 07/21/16  History reviewed, patient examined, no change in status, stable for surgery.

## 2016-07-27 NOTE — Interval H&P Note (Signed)
History and Physical Interval Note:  07/27/2016 8:34 AM  Erin Ingram  has presented today for surgery, with the diagnosis of dysphagia/diarrhea  The various methods of treatment have been discussed with the patient and family. After consideration of risks, benefits and other options for treatment, the patient has consented to  Procedure(s): ESOPHAGOGASTRODUODENOSCOPY (EGD) WITH PROPOFOL (N/A) SAVORY DILATION (N/A) COLONOSCOPY WITH PROPOFOL (N/A) as a surgical intervention .  The patient's history has been reviewed, patient examined, no change in status, stable for surgery.  I have reviewed the patient's chart and labs.  Questions were answered to the patient's satisfaction.     Noble C.

## 2016-07-27 NOTE — Op Note (Signed)
[facility] Patient Name: Erin Ingram Procedure Date : 07/27/2016 MRN: [id number] Attending MD: Lear Ng , MD Date of Birth: June 15, 1940 CSN: [id number] Age: 76 Admit Type: Outpatient Procedure:                Colonoscopy Indications:              Screening for colorectal malignant neoplasm, Last                            colonoscopy: July 2006 Providers:                Lear Ng, MD, Zenon Mayo, RN, Corliss Parish, Technician Referring MD:             Hulan Fess MD Medicines:                Propofol per Anesthesia, Monitored Anesthesia Care Complications:            No immediate complications. Estimated Blood Loss:     Estimated blood loss: none. Procedure:                Pre-Anesthesia Assessment:                           - Prior to the procedure, a History and Physical                            was performed, and patient medications and                            allergies were reviewed. The patient's tolerance of                            previous anesthesia was also reviewed. The risks                            and benefits of the procedure and the sedation                            options and risks were discussed with the patient.                            All questions were answered, and informed consent                            was obtained. Prior Anticoagulants: The patient has                            taken Plavix (clopidogrel), last dose was 7 days                            prior to procedure. ASA Grade Assessment: III - A  patient with severe systemic disease. After                            reviewing the risks and benefits, the patient was                            deemed in satisfactory condition to undergo the                            procedure.                           After obtaining informed consent, the colonoscope                            was passed  under direct vision. Throughout the                            procedure, the patient's blood pressure, pulse, and                            oxygen saturations were monitored continuously. The                            EC-[id number] ([id number]) scope was introduced through                            the anus and advanced to the the cecum, identified                            by appendiceal orifice and ileocecal valve. The                            colonoscopy was performed with difficulty due to                            significant looping, multiple polyps, and a                            tortuous colon. Successful completion of the                            procedure was aided by straightening and shortening                            the scope to obtain bowel loop reduction, applying                            abdominal pressure, performing the maneuvers                            documented (below) in this report and receiving  assistance from additional staff. The patient                            tolerated the procedure fairly well. The quality of                            the bowel preparation was fair but repeated                            irrigation led to a good and adequate prep. The                            ileocecal valve, appendiceal orifice, and rectum                            were photographed. Scope In: 9:00:45 AM Scope Out: 10:07:20 AM Scope Withdrawal Time: 0 hours 58 minutes 7 seconds  Total Procedure Duration: 1 hour 6 minutes 35 seconds  Findings:      The perianal and digital rectal examinations were normal.      11 sessile and semi-sessile polyps were found in the sigmoid colon,       descending colon, ascending colon and cecum. The polyps were 4 to 16 mm       in size. These polyps were removed with a hot snare. Resection and       retrieval were complete. Estimated blood loss: none.      Scattered small-mouthed diverticula were  found in the sigmoid colon.      Internal hemorrhoids were found during retroflexion. The hemorrhoids       were small and Grade I (internal hemorrhoids that do not prolapse). Impression:               - 11 4 to 16 mm polyps in the sigmoid colon, in the                            descending colon, in the ascending colon and in the                            cecum, removed with a hot snare. Resected and                            retrieved.                           - Diverticulosis in the sigmoid colon.                           - Internal hemorrhoids. Moderate Sedation:      N/A - MAC procedure Recommendation:           - Patient has a contact number available for                            emergencies. The signs and symptoms of potential  delayed complications were discussed with the                            patient. Return to normal activities tomorrow.                            Written discharge instructions were provided to the                            patient.                           - High fiber diet.                           - Await pathology results.                           - Resume Plavix (clopidogrel) at prior dose in 3                            days.                           - Repeat colonoscopy for surveillance based on                            pathology results. Procedure Code(s):        --- Professional ---                           [phone], Colonoscopy, flexible; with removal of                            tumor(s), polyp(s), or other lesion(s) by snare                            technique Diagnosis Code(s):        --- Professional ---                           Z12.11, Encounter for screening for malignant                            neoplasm of colon                           D12.5, Benign neoplasm of sigmoid colon                           D12.4, Benign neoplasm of descending colon                           D12.2, Benign neoplasm  of ascending colon                           D12.0, Benign neoplasm of cecum  K64.0, First degree hemorrhoids                           K57.30, Diverticulosis of large intestine without                            perforation or abscess without bleeding CPT copyright 2016 [employer]. All rights reserved. The codes documented in this report are preliminary and upon coder review may  be revised to meet current compliance requirements. Lear Ng, MD 07/27/2016 10:27:08 AM This report has been signed electronically. Number of Addenda: 0

## 2016-07-27 NOTE — Progress Notes (Addendum)
Was the fall witnessed: No  Patient condition before and after the fall: A&O x 4.   Patient's reaction to the fall: Embarrassed, said her husband got in the way and that's what caused her to fall.   Name of the doctor that was notified including date and time: Dr. Wilford Corner on 5.7.18 @ 1105 and Dr. Suella Broad on 5.7.18 @ 1105.   Any interventions and vital signs: None  Patient dressing with husband's assistance in bathroom post procedure. Patient's husband assisted with fall. Patient hit only the upper left arm, no bruises or lacerations noted. Patient did not hit her head or loose consciousness. Both MD's agreed for patient to be discharged. Will follow up with patient tomorrow morning and unit number given to patient if she experiences any symptoms while at home.

## 2016-07-27 NOTE — Anesthesia Postprocedure Evaluation (Addendum)
Anesthesia Post Note  Patient: Erin Ingram  Procedure(s) Performed: Procedure(s) (LRB): ESOPHAGOGASTRODUODENOSCOPY (EGD) WITH PROPOFOL (N/A) SAVORY DILATION (N/A) COLONOSCOPY WITH PROPOFOL (N/A)  Patient location during evaluation: PACU Anesthesia Type: MAC Level of consciousness: awake and alert Pain management: pain level controlled Vital Signs Assessment: post-procedure vital signs reviewed and stable Respiratory status: spontaneous breathing, nonlabored ventilation, respiratory function stable and patient connected to nasal cannula oxygen Cardiovascular status: stable and blood pressure returned to baseline Anesthetic complications: no       Last Vitals:  Vitals:   07/27/16 1030 07/27/16 1040  BP: (!) 136/55 (!) 106/47  Pulse: 87 83  Resp: 20 20  Temp:      Last Pain:  Vitals:   07/27/16 1020  TempSrc: Oral                 Effie Berkshire

## 2016-07-27 NOTE — Discharge Instructions (Signed)
YOU HAD AN ENDOSCOPIC PROCEDURE TODAY: Refer to the procedure report and other information in the discharge instructions given to you for any specific questions about what was found during the examination. If this information does not answer your questions, please call [facility] office at [phone] to clarify.   YOU SHOULD EXPECT: Some feelings of bloating in the abdomen. Passage of more gas than usual. Walking can help get rid of the air that was put into your GI tract during the procedure and reduce the bloating. If you had a lower endoscopy (such as a colonoscopy or flexible sigmoidoscopy) you may notice spotting of blood in your stool or on the toilet paper. Some abdominal soreness may be present for a day or two, also.  DIET: Your first meal following the procedure should be a light meal and then it is ok to progress to your normal diet. A half-sandwich or bowl of soup is an example of a good first meal. Heavy or fried foods are harder to digest and may make you feel nauseous or bloated. Drink plenty of fluids but you should avoid alcoholic beverages for 24 hours. If you had a esophageal dilation, please see attached instructions for diet.   ACTIVITY: Your care partner should take you home directly after the procedure. You should plan to take it easy, moving slowly for the rest of the day. You can resume normal activity the day after the procedure however YOU SHOULD NOT DRIVE, use power tools, machinery or perform tasks that involve climbing or major physical exertion for 24 hours (because of the sedation medicines used during the test).   SYMPTOMS TO REPORT IMMEDIATELY: A gastroenterologist can be reached at any hour. Please call [phone]  for any of the following symptoms:  Following lower endoscopy (colonoscopy, flexible sigmoidoscopy) Excessive amounts of blood in the stool  Significant tenderness, worsening of abdominal pains  Swelling of the abdomen that is new, acute  Fever of 100 or  higher  Following upper endoscopy (EGD, EUS, ERCP, esophageal dilation) Vomiting of blood or coffee ground material  New, significant abdominal pain  New, significant chest pain or pain under the shoulder blades  Painful or persistently difficult swallowing  New shortness of breath  Black, tarry-looking or red, bloody stools  FOLLOW UP:  If any biopsies were taken you will be contacted by phone or by letter within the next 1-3 weeks. Call [phone]  if you have not heard about the biopsies in 3 weeks.  Please also call with any specific questions about appointments or follow up tests.  HOLD Plavix (Clopidogrel) for another 3 days and then resume.

## 2016-07-27 NOTE — Op Note (Signed)
[facility] Patient Name: Erin Ingram Procedure Date : 07/27/2016 MRN: [id number] Attending MD: Lear Ng , MD Date of Birth: Nov 06, 1940 CSN: [id number] Age: 76 Admit Type: Outpatient Procedure:                Upper GI endoscopy Indications:              Dysphagia Providers:                Lear Ng, MD, Zenon Mayo, RN, Corliss Parish, Technician Referring MD:              Medicines:                Propofol per Anesthesia, Monitored Anesthesia Care Complications:            No immediate complications. Estimated Blood Loss:     Estimated blood loss: none. Procedure:                Pre-Anesthesia Assessment:                           - Prior to the procedure, a History and Physical                            was performed, and patient medications and                            allergies were reviewed. The patient's tolerance of                            previous anesthesia was also reviewed. The risks                            and benefits of the procedure and the sedation                            options and risks were discussed with the patient.                            All questions were answered, and informed consent                            was obtained. Prior Anticoagulants: The patient has                            taken Plavix (clopidogrel), last dose was 7 days                            prior to procedure. ASA Grade Assessment: III - A                            patient with severe systemic disease. After  reviewing the risks and benefits, the patient was                            deemed in satisfactory condition to undergo the                            procedure.                           After obtaining informed consent, the endoscope was                            passed under direct vision. Throughout the                            procedure, the patient's blood pressure,  pulse, and                            oxygen saturations were monitored continuously. The                            [id number] ([id number]) scope was introduced through the                            mouth, and advanced to the second part of duodenum.                            The upper GI endoscopy was accomplished without                            difficulty. The patient tolerated the procedure                            well. Scope In: Scope Out: Findings:      The examined esophagus was normal.      The Z-line was regular and was found 40 cm from the incisors.      Segmental minimal inflammation characterized by congestion (edema) and       erythema was found in the gastric antrum.      The cardia and gastric fundus were normal on retroflexion.      The examined duodenum was normal. Impression:               - Normal esophagus.                           - Z-line regular, 40 cm from the incisors.                           - Acute gastritis.                           - Normal examined duodenum.                           - No specimens collected. Moderate Sedation:      N/A -  MAC procedure Recommendation:           - Patient has a contact number available for                            emergencies. The signs and symptoms of potential                            delayed complications were discussed with the                            patient. Return to normal activities tomorrow.                            Written discharge instructions were provided to the                            patient.                           - See colonoscopy report for instructions for                            resuming anticoagulation.                           - Resume previous diet.                           - Post procedure medication orders were given. Procedure Code(s):        --- Professional ---                           [phone], Esophagogastroduodenoscopy, flexible,                             transoral; diagnostic, including collection of                            specimen(s) by brushing or washing, when performed                            (separate procedure) Diagnosis Code(s):        --- Professional ---                           R13.10, Dysphagia, unspecified                           K29.00, Acute gastritis without bleeding CPT copyright 2016 American Medical Association. All rights reserved. The codes documented in this report are preliminary and upon coder review may  be revised to meet current compliance requirements. Lear Ng, MD 07/27/2016 10:18:18 AM This report has been signed electronically. Number of Addenda: 0

## 2016-07-31 ENCOUNTER — Encounter: Payer: Self-pay | Admitting: Hematology

## 2016-07-31 ENCOUNTER — Telehealth: Payer: Self-pay | Admitting: Hematology

## 2016-07-31 NOTE — Telephone Encounter (Signed)
Received a call from the pt to schedule an appt. Appt has been scheduled for the pt to see Dr. Irene Limbo on 6/6 at 145m. Pt aware to arrive 30 minutes early. Demographics verified. Letter mailed and faxed to the referring.

## 2016-08-21 NOTE — Addendum Note (Signed)
Addendum  created 08/21/16 1224 by Quantavia Frith D, MD   Sign clinical note    

## 2016-08-26 ENCOUNTER — Ambulatory Visit (HOSPITAL_BASED_OUTPATIENT_CLINIC_OR_DEPARTMENT_OTHER): Payer: Medicare Other | Admitting: Hematology

## 2016-08-26 ENCOUNTER — Encounter: Payer: Self-pay | Admitting: Hematology

## 2016-08-26 ENCOUNTER — Ambulatory Visit (HOSPITAL_BASED_OUTPATIENT_CLINIC_OR_DEPARTMENT_OTHER): Payer: Medicare Other

## 2016-08-26 DIAGNOSIS — D729 Disorder of white blood cells, unspecified: Secondary | ICD-10-CM

## 2016-08-26 DIAGNOSIS — N189 Chronic kidney disease, unspecified: Secondary | ICD-10-CM | POA: Diagnosis not present

## 2016-08-26 LAB — CBC & DIFF AND RETIC
BASO%: 0.1 % (ref 0.0–2.0)
Basophils Absolute: 0 10*3/uL (ref 0.0–0.1)
EOS%: 0.5 % (ref 0.0–7.0)
Eosinophils Absolute: 0.1 10*3/uL (ref 0.0–0.5)
HCT: 34.9 % (ref 34.8–46.6)
HGB: 11.3 g/dL — ABNORMAL LOW (ref 11.6–15.9)
Immature Retic Fract: 14.1 % — ABNORMAL HIGH (ref 1.60–10.00)
LYMPH%: 8.7 % — ABNORMAL LOW (ref 14.0–49.7)
MCH: 30.5 pg (ref 25.1–34.0)
MCHC: 32.4 g/dL (ref 31.5–36.0)
MCV: 94.3 fL (ref 79.5–101.0)
MONO#: 0.9 10*3/uL (ref 0.1–0.9)
MONO%: 5.1 % (ref 0.0–14.0)
NEUT#: 15.3 10*3/uL — ABNORMAL HIGH (ref 1.5–6.5)
NEUT%: 85.6 % — ABNORMAL HIGH (ref 38.4–76.8)
Platelets: 251 10*3/uL (ref 145–400)
RBC: 3.7 10*6/uL (ref 3.70–5.45)
RDW: 14.3 % (ref 11.2–14.5)
Retic %: 3.53 % — ABNORMAL HIGH (ref 0.70–2.10)
Retic Ct Abs: 130.61 10*3/uL — ABNORMAL HIGH (ref 33.70–90.70)
WBC: 17.9 10*3/uL — ABNORMAL HIGH (ref 3.9–10.3)
lymph#: 1.6 10*3/uL (ref 0.9–3.3)

## 2016-08-26 LAB — CHCC SMEAR

## 2016-08-26 LAB — COMPREHENSIVE METABOLIC PANEL
ALT: 11 U/L (ref 0–55)
AST: 10 U/L (ref 5–34)
Albumin: 3.7 g/dL (ref 3.5–5.0)
Alkaline Phosphatase: 56 U/L (ref 40–150)
Anion Gap: 12 mEq/L — ABNORMAL HIGH (ref 3–11)
BUN: 24.3 mg/dL (ref 7.0–26.0)
CO2: 26 mEq/L (ref 22–29)
Calcium: 9.6 mg/dL (ref 8.4–10.4)
Chloride: 103 mEq/L (ref 98–109)
Creatinine: 1.4 mg/dL — ABNORMAL HIGH (ref 0.6–1.1)
EGFR: 38 mL/min/{1.73_m2} — ABNORMAL LOW (ref >90)
Glucose: 116 mg/dl (ref 70–140)
Potassium: 3.8 mEq/L (ref 3.5–5.1)
Sodium: 141 mEq/L (ref 136–145)
Total Bilirubin: 0.3 mg/dL (ref 0.20–1.20)
Total Protein: 7 g/dL (ref 6.4–8.3)

## 2016-08-26 LAB — LACTATE DEHYDROGENASE: LDH: 228 U/L (ref 125–245)

## 2016-08-26 NOTE — Patient Instructions (Signed)
Thank you for choosing [facility] to provide your oncology and hematology care.  To afford each patient quality time with our providers, please arrive 30 minutes before your scheduled appointment time.  If you arrive late for your appointment, you may be asked to reschedule.  We strive to give you quality time with our providers, and arriving late affects you and other patients whose appointments are after yours.   If you are a no show for multiple scheduled visits, you may be dismissed from the clinic at the providers discretion.    Again, thank you for choosing [facility], our hope is that these requests will decrease the amount of time that you wait before being seen by our physicians.  ______________________________________________________________________  Should you have questions after your visit to the [facility], please contact our office at [phone] between the hours of 8:30 and 4:30 p.m.    Voicemails left after 4:30p.m will not be returned until the following business day.    For prescription refill requests, please have your pharmacy contact us directly.  Please also try to allow 48 hours for prescription requests.    Please contact the scheduling department for questions regarding scheduling.  For scheduling of procedures such as PET scans, CT scans, MRI, Ultrasound, etc please contact central scheduling at [phone].    Resources For Cancer Patients and Caregivers:   Oncolink.org:  A wonderful resource for patients and healthcare providers for information regarding your disease, ways to tract your treatment, what to expect, etc.     American Cancer Society:  800-227-2345  Can help patients locate various types of support and financial assistance  Cancer Care: 1-800-813-HOPE (4673) Provides financial assistance, online support groups, medication/co-pay assistance.    [place] DSS:  336-641-3447 Where to apply for food  stamps, Medicaid, and utility assistance  Medicare Rights Center: 800-333-4114 Helps people with Medicare understand their rights and benefits, navigate the Medicare system, and secure the quality healthcare they deserve  [employer]: 336-333-6589 [place] [employer]'s shared-ride transportation service for eligible riders who have a disability that prevents them from riding the fixed route bus.    For additional information on assistance programs please contact our social worker:   Grier Hock/Abigail Elmore:  [phone]            

## 2016-08-27 LAB — SEDIMENTATION RATE: Sedimentation Rate-Westergren: 4 mm/hr (ref 0–40)

## 2016-08-27 LAB — KAPPA/LAMBDA LIGHT CHAINS
Ig Kappa Free Light Chain: 21.6 mg/L — ABNORMAL HIGH (ref 3.3–19.4)
Ig Lambda Free Light Chain: 17 mg/L (ref 5.7–26.3)
Kappa/Lambda FluidC Ratio: 1.27 (ref 0.26–1.65)

## 2016-08-29 ENCOUNTER — Other Ambulatory Visit: Payer: Self-pay | Admitting: Interventional Cardiology

## 2016-08-31 LAB — MULTIPLE MYELOMA PANEL, SERUM
Albumin SerPl Elph-Mcnc: 3.5 g/dL (ref 2.9–4.4)
Albumin/Glob SerPl: 1.2 (ref 0.7–1.7)
Alpha 1: 0.3 g/dL (ref 0.0–0.4)
Alpha2 Glob SerPl Elph-Mcnc: 1 g/dL (ref 0.4–1.0)
B-Globulin SerPl Elph-Mcnc: 1.1 g/dL (ref 0.7–1.3)
Gamma Glob SerPl Elph-Mcnc: 0.6 g/dL (ref 0.4–1.8)
Globulin, Total: 3 g/dL (ref 2.2–3.9)
IgA, Qn, Serum: 191 mg/dL (ref 64–422)
IgG, Qn, Serum: 663 mg/dL — ABNORMAL LOW (ref 700–1600)
IgM, Qn, Serum: 40 mg/dL (ref 26–217)
Total Protein: 6.5 g/dL (ref 6.0–8.5)

## 2016-10-07 ENCOUNTER — Other Ambulatory Visit: Payer: Self-pay | Admitting: Physician Assistant

## 2016-10-07 MED ORDER — ISOSORBIDE MONONITRATE ER 30 MG PO TB24
30.0000 mg | ORAL_TABLET | Freq: Every day | ORAL | 1 refills | Status: DC
Start: 2016-10-07 — End: 2017-01-22

## 2016-10-07 MED ORDER — RANOLAZINE ER 1000 MG PO TB12: 1000.0000 mg | ORAL_TABLET | Freq: Two times a day (BID) | ORAL | 1 refills | Status: DC

## 2016-10-07 MED ORDER — ATORVASTATIN CALCIUM 10 MG PO TABS: 10.0000 mg | ORAL_TABLET | Freq: Every day | ORAL | 1 refills | Status: DC

## 2016-10-08 ENCOUNTER — Telehealth: Payer: Self-pay | Admitting: *Deleted

## 2016-10-08 ENCOUNTER — Other Ambulatory Visit: Payer: Self-pay | Admitting: Physician Assistant

## 2016-10-08 NOTE — Telephone Encounter (Signed)
Patient calling to see if she is to take Imdur 30mg  or Imdur 60mg . She states she believes the last person got it wrong and she wanted me to look back into her chart to see when this was changed. I let her know her the medication list changed after hospital visit in January.  She would like a call back from Dr. Irish Lack and his nurse to go over her medication list.

## 2016-10-09 MED ORDER — ISOSORBIDE MONONITRATE ER 60 MG PO TB24: 120.0000 mg | ORAL_TABLET | Freq: Every day | ORAL | 3 refills | Status: DC

## 2016-10-09 NOTE — Telephone Encounter (Signed)
Patient has been taking Imdur 60 mg 2 tablets QD and imdur 30 mg QHS. Patinet states that her pharmacy let her know that the 60 mg 2 tablets QD was not ordered anymore. It looks like it got taken off of her med list when she was discharged on on 04/11/16 from the hospital. Per Pecolia Ades, NP note from 1/19:  -Continue her medical therapy for angina including isosorbide (60 mg bid and 30 mg at hs)  Patient states that she has been taking the 60 mg 2 tablets QD and 30 mg QHS since being discharged and has not had any recurrent angina. Please advise if we can reorder the imdur 60 mg 2 tablets daily.

## 2016-10-09 NOTE — Telephone Encounter (Signed)
Left message for patient to call back  

## 2016-10-09 NOTE — Telephone Encounter (Signed)
Discussed with Dr. Irish Lack. Patient made aware that he was okay with continuing the imdur 60 mg 2 tablets daily in addition to the 30 mg QHS. Rx sent to patient's preferred pharmacy.   If pharmacy will not approve we will try imdur 60 mg BID.

## 2016-10-09 NOTE — Telephone Encounter (Signed)
Follow Up: ° ° ° °Returning your call from this morning. °

## 2016-10-25 NOTE — Progress Notes (Signed)
Marland Kitchen    HEMATOLOGY/ONCOLOGY CONSULTATION NOTE  Date of Service: .08/26/2016  Patient Care Team: Hulan Fess, MD as PCP - General (Family Medicine)  CHIEF COMPLAINTS/PURPOSE OF CONSULTATION:  Leucocytosis  HISTORY OF PRESENTING ILLNESS:   Erin Ingram is a wonderful 76 y.o. female who has been referred to [place] by Dr .Hulan Fess, MD for evaluation and management of leucocytosis.  Patient has an extensive history of chronic medical problems as noted below including hypertension, dyslipidemia, obesity, obstructive sleep apnea on CPAP, polymyalgia rheumatica on chronic prednisone 10 mg by mouth daily, recent recurrent UTI, CKD stage III, chronic diastolic heart failure, TIA, subclavian artery stenosis, hypothyroidism, depression , and other medical concerns.  She recently had labs with her primary care physician on 07/15/2016 including a CBC that showed a WBC count of 18.9k with neutrophils of 16.3k normal hemoglobin of 12.3 with an MCV of 92 and normal platelet count of 324k. Patient notes no unexpected weight loss fevers chills or night sweats.  Review of labs show that the patient has had chronic leukocytosis ranging between 11-18k since September 2016.  Labs done in clinic today show a WBC count of 17.9k with 15.3k neutrophils hemoglobin of 11.3 with an MCV of 94 and normal platelet count of 251k. Patient notes no fevers no chills no night sweats currently. Denies any overt focal symptoms of infection at this time. She continues to be on chronic prednisone therapy and is currently on 10 mg by mouth daily for her polymyalgia rheumatica. She has had a couple of recent urinary tract infections treated with antibiotics. Notes that overall she is feeling well. Was off her CPAP machine and was given a new referral to sleep medicine for a new machine. This could set was an additional stressor.  Patient was former smoker and smoked half pack per day from 1961 through 1970. Denies any  critical drug use. Occasional wine but no significant alcohol abuse.  Patient's leukocytosis has been chronic and was also noted to be evaluated by Dr. Julien Nordmann previously in 2011.   MEDICAL HISTORY:  Past Medical History:  Diagnosis Date  . (HFpEF) heart failure with preserved ejection fraction (HCC)    a. normal EF, LVEDP at [facility] in 6/17 33 >> Lasix started   . Anemia   . Arthritis    "fingers, back, shoulders, hips" (04/10/2016)  . Chronic diastolic CHF (congestive heart failure) ([place])   . CKD (chronic kidney disease), stage III   . Coronary artery disease    a. s/p CABG 2000 (SVG/ free LIMA Y graft to the diagonal and distal LAD, SVG to the OM 1, and SVG to the PDA) . b. Cath 12/19/2014 90% dSVG to RCA s/p 2 overlapping DES. c. LHC 2016, 2017 no intervention except diuresis needed. d. Low risk nuc 03/2016.  . Depression    with anxious component  . GERD (gastroesophageal reflux disease)   . History of hiatal hernia   . Hyperlipidemia   . Hypothyroidism   . Obesity   . OSA on CPAP   . PMR (polymyalgia rheumatica) (HCC)   . Pneumonia    "2-3 times" (04/10/2016)  . Stable angina (HCC)    microvascular, improved with Ranexa  . Subclavian artery stenosis ([place])   . TIA (transient ischemic attack)   Lactose intolerance Hypothyroidism  SURGICAL HISTORY: Past Surgical History:  Procedure Laterality Date  . BREAST BIOPSY Right 1964  . CARDIAC CATHETERIZATION  08   patent grafts, no culprit lesions, EF 65%  .  CARDIAC CATHETERIZATION N/A 12/19/2014   Procedure: Left Heart Cath and Cors/Grafts Angiography;  Surgeon: Jettie Booze, MD;  Location: [place] [facility];  Service: Cardiovascular;  Laterality: N/A;  . CARDIAC CATHETERIZATION N/A 12/19/2014   Procedure: Coronary Stent Intervention;  Surgeon: Jettie Booze, MD;  Location: [place] [facility];  Service: Cardiovascular;  Laterality: N/A;  . CARDIAC CATHETERIZATION N/A 01/01/2015   Procedure: Left Heart Cath and  Cors/Grafts Angiography;  Surgeon: Jettie Booze, MD;  Location: [place] [facility];  Service: Cardiovascular;  Laterality: N/A;  . CARDIAC CATHETERIZATION N/A 09/20/2015   Procedure: Left Heart Cath and Cors/Grafts Angiography;  Surgeon: Jettie Booze, MD;  Location: [place] [facility];  Service: Cardiovascular;  Laterality: N/A;  . CATARACT EXTRACTION W/ INTRAOCULAR LENS  IMPLANT, BILATERAL Bilateral 2011  . COLONOSCOPY WITH PROPOFOL N/A 07/27/2016   Procedure: COLONOSCOPY WITH PROPOFOL;  Surgeon: Wilford Corner, MD;  Location: [place];  Service: Endoscopy;  Laterality: N/A;  . CORONARY ARTERY BYPASS GRAFT  2000   ASCVD, multivessel, S./P.  . DILATION AND CURETTAGE OF UTERUS  1980s  . ESOPHAGOGASTRODUODENOSCOPY (EGD) WITH PROPOFOL N/A 07/27/2016   Procedure: ESOPHAGOGASTRODUODENOSCOPY (EGD) WITH PROPOFOL;  Surgeon: Wilford Corner, MD;  Location: [place];  Service: Endoscopy;  Laterality: N/A;  . FRACTURE SURGERY    . PATELLA FRACTURE SURGERY Left 1993    SOCIAL HISTORY: Social History   Social History  . Marital status: Married    Spouse name: N/A  . Number of children: 2  . Years of education: College   Occupational History  . Retired Cytogeneticist    Social History Main Topics  . Smoking status: Former Smoker    Packs/day: 0.50    Years: 7.00    Types: Cigarettes    Quit date: 08/21/1969  . Smokeless tobacco: Never Used  . Alcohol use 0.0 oz/week     Comment: rare  . Drug use: No  . Sexual activity: No   Other Topics Concern  . Not on file   Social History Narrative   Lives in [place] with husband.    Right-handed.   1 cup caffeine per day.    FAMILY HISTORY: Family History  Problem Relation Age of Onset  . Heart disease Mother   . Heart attack Mother   . Heart disease Father   . Heart attack Father   . Diabetes Brother   . Stroke Paternal Grandmother   . Hypertension Neg Hx     ALLERGIES:  is allergic to nsaids; stadol  [butorphanol]; sulfa antibiotics; bisoprolol fumarate; butorphanol tartrate; codeine; demerol [meperidine]; imipramine; statins; tequin [gatifloxacin]; brilinta [ticagrelor]; pseudoephedrine hcl; septra [sulfamethoxazole-trimethoprim]; and sulfamethoxazole-trimethoprim.  MEDICATIONS:  Current Outpatient Prescriptions  Medication Sig Dispense Refill  . amLODipine (NORVASC) 5 MG tablet TAKE 1 TABLET ONCE DAILY. 30 tablet 10  . aspirin 81 MG chewable tablet Chew 1 tablet (81 mg total) by mouth daily.    . Calcium Glycerophosphate (PRELIEF PO) Take 1 tablet by mouth 3 (three) times daily as needed.     . Cholecalciferol (VITAMIN D PO) Take 1 tablet by mouth daily.    . Cinnamon 500 MG TABS Take 1 each by mouth daily.    . clopidogrel (PLAVIX) 75 MG tablet TAKE 1 TABLET ONCE DAILY. 90 tablet 1  . Coenzyme Q10 10 MG capsule Take 10 mg by mouth daily.    . fluocinonide cream (LIDEX) 7.65 % Apply 1 application topically 2 (two) times daily as needed (rash).     Marland Kitchen  fluticasone (FLONASE) 50 MCG/ACT nasal spray Place 2 sprays into both nostrils daily as needed for allergies.     . furosemide (LASIX) 20 MG tablet Take 1 tablet (20 mg total) by mouth 2 (two) times daily as needed. (Patient taking differently: Take 20 mg by mouth daily as needed. ) 180 tablet 3  . levocetirizine (XYZAL) 5 MG tablet Take 5 mg by mouth every evening.    Marland Kitchen levothyroxine (SYNTHROID, LEVOTHROID) 75 MCG tablet Take 75 mcg by mouth daily before breakfast.     . Melatonin 1 MG TABS Take 0.5 mg by mouth at bedtime. Reported on 03/22/2015    . mirabegron ER (MYRBETRIQ) 25 MG TB24 tablet Take 50 mg by mouth daily.     . nitroGLYCERIN (NITROSTAT) 0.4 MG SL tablet Place 0.4 mg under the tongue every 5 (five) minutes as needed for chest pain. X 3 doses    . NONFORMULARY OR COMPOUNDED ITEM [facility]:  [place] Tendonitis Cream - Diclofenac 3%, Baclofen 2%, Bupivacaine 1%, GAbapentin 6%, Ibuprofen 3%, Pentoxifylline 3%, apply 1-2 grams  to affected area 3-4 times daily. (Patient taking differently: Apply 1 application topically daily as needed. [facility]:  [place] Tendonitis Cream - Diclofenac 3%, Baclofen 2%, Bupivacaine 1%, GAbapentin 6%, Ibuprofen 3%, Pentoxifylline 3%, apply 1-2 grams to affected area 3-4 times daily.) 120 each 2  . predniSONE (DELTASONE) 10 MG tablet Take 10 mg by mouth daily with breakfast.     . Psyllium 400 MG CAPS Take 400 mg by mouth daily.    Marland Kitchen venlafaxine XR (EFFEXOR-XR) 75 MG 24 hr capsule Take 225 mg by mouth daily with breakfast. Patient takes 3 capsules    . atorvastatin (LIPITOR) 10 MG tablet Take 1 tablet (10 mg total) by mouth daily. 90 tablet 1  . isosorbide mononitrate (IMDUR) 30 MG 24 hr tablet Take 1 tablet (30 mg total) by mouth at bedtime. 90 tablet 1  . isosorbide mononitrate (IMDUR) 60 MG 24 hr tablet Take 2 tablets (120 mg total) by mouth daily. 90 tablet 3  . metoprolol succinate (TOPROL-XL) 100 MG 24 hr tablet Take 1 tablet (100 mg total) by mouth daily. Please call and schedule August 2018 follow up. [phone] 30 tablet 1  . ranolazine (RANEXA) 1000 MG SR tablet Take 1 tablet (1,000 mg total) by mouth 2 (two) times daily. 180 tablet 1   Current Facility-Administered Medications  Medication Dose Route Frequency Provider Last Rate Last Dose  . betamethasone acetate-betamethasone sodium phosphate (CELESTONE) injection 3 mg  3 mg Intramuscular Once Edrick Kins, DPM        REVIEW OF SYSTEMS:    10 Point review of Systems was done is negative except as noted above.  PHYSICAL EXAMINATION: ECOG PERFORMANCE STATUS: 2 - Symptomatic, <50% confined to bed Vital signs reviewed in Epic GENERAL:alert, in no acute distress and comfortable SKIN: no acute rashes, no significant lesions EYES: conjunctiva are pink and non-injected, sclera anicteric OROPHARYNX: MMM, no exudates, no oropharyngeal erythema or ulceration NECK: supple, no JVD LYMPH:  no palpable lymphadenopathy in the  cervical, axillary or inguinal regions LUNGS: clear to auscultation b/l with normal respiratory effort HEART: regular rate & rhythm ABDOMEN:  normoactive bowel sounds , non tender, not distended. No palpable hepatosplenomegaly Extremity: no pedal edema PSYCH: alert & oriented x 3 with fluent speech NEURO: no focal motor/sensory deficits  LABORATORY DATA:  I have reviewed the data as listed  . CBC Latest Ref Rng & Units 08/26/2016 04/10/2016 04/10/2016  WBC  3.9 - 10.3 10e3/uL 17.9(H) 12.5(H) 13.1(H)  Hemoglobin 11.6 - 15.9 g/dL 11.3(L) 11.6(L) 10.7(L)  Hematocrit 34.8 - 46.6 % 34.9 36.4 33.5(L)  Platelets 145 - 400 10e3/uL 251 245 226    . CMP Latest Ref Rng & Units 08/26/2016 08/26/2016 04/10/2016  Glucose 70 - 140 mg/dl 116 - -  BUN 7.0 - 26.0 mg/dL 24.3 - -  Creatinine 0.6 - 1.1 mg/dL 1.4(H) - 1.10(H)  Sodium 136 - 145 mEq/L 141 - -  Potassium 3.5 - 5.1 mEq/L 3.8 - -  Chloride 101 - 111 mmol/L - - -  CO2 22 - 29 mEq/L 26 - -  Calcium 8.4 - 10.4 mg/dL 9.6 - -  Total Protein 6.0 - 8.5 g/dL 7.0 6.5 -  Total Bilirubin 0.20 - 1.20 mg/dL 0.30 - -  Alkaline Phos 40 - 150 U/L 56 - -  AST 5 - 34 U/L 10 - -  ALT 0 - 55 U/L 11 - -   Component     Latest Ref Rng & Units 08/26/2016  IgG (Immunoglobin G), Serum     700 - 1,600 mg/dL 663 (L)  IgA/Immunoglobulin A, Serum     64 - 422 mg/dL 191  IgM, Qn, Serum     26 - 217 mg/dL 40  Total Protein     6.0 - 8.5 g/dL 6.5  Albumin SerPl Elph-Mcnc     2.9 - 4.4 g/dL 3.5  Alpha 1     0.0 - 0.4 g/dL 0.3  Alpha2 Glob SerPl Elph-Mcnc     0.4 - 1.0 g/dL 1.0  B-Globulin SerPl Elph-Mcnc     0.7 - 1.3 g/dL 1.1  Gamma Glob SerPl Elph-Mcnc     0.4 - 1.8 g/dL 0.6  M Protein SerPl Elph-Mcnc     Not Observed g/dL Not Observed  Globulin, Total     2.2 - 3.9 g/dL 3.0  Albumin/Glob SerPl     0.7 - 1.7 1.2  IFE 1      Comment  Please Note (HCV):      Comment  Ig Kappa Free Light Chain     3.3 - 19.4 mg/L 21.6 (H)  Ig Lambda Free Light Chain      5.7 - 26.3 mg/L 17.0  Kappa/Lambda FluidC Ratio     0.26 - 1.65 1.27  Sed Rate     0 - 40 mm/hr 4  LDH     125 - 245 U/L 228      RADIOGRAPHIC STUDIES: I have personally reviewed the radiological images as listed and agreed with the findings in the report. No results found.  ASSESSMENT & PLAN:   76 year old female with a multitude of chronic medical problems with  #1 Chronic leukocytosis/neutrophilia.  This appears to be likely reactive neutrophilia with no overt evidence of a myeloproliferative neoplasm. This could be due to her chronic steroid use, obesity, recent increase with recent urinary tract infections, stress with chronic microvascular angina, stress due to not being on CPAP currently. Morbid Obesity -BMI 41 - could itself be a etiology for chronic leukocytosis. Patient has quit smoking long-term back and this does not appear to be a risk factor currently.  BCR ABL testing was negative and does not suggest clonal process. Peripheral blood smear does not show significant mild left shift. No other associated thrombocytosis or polycythemia to suggest a myeloproliferative process. No overt focal symptoms suggesting malignancy.  Plan -All the available lab results were reviewed in details. -Currently there is no overt  evidence of malignancy or myeloproliferative neoplasm -Patient's neutrophilia appears to be reactive and nonprogressive with no clear indication for bone marrow examination at this time . -The focus of her care would be to manage her complex chronic medical issues as per her primary care physician . -She has had recent urinary tract infections that have been treated with antibiotics . -She is on chronic steroids for polymyalgia rheumatica which also will be managed by her primary care physician and dermatologist . -Follow-up with primary care physician to rule out other inflammatory states or infections. No additional workup recommended at this time  .  Kindly consult [place] if any significant change in her CBC with other new concerning clinical correlates to suggest myeloproliferative neoplasm or malignancy .  All of the patients questions were answered with apparent satisfaction. The patient knows to call the clinic with any problems, questions or concerns.  I spent 45 minutes counseling the patient face to face. The total time spent in the appointment was 60 minutes and more than 50% was on counseling and direct patient cares.    Sullivan Lone MD [place] [facility]AHIVMS [facility] Hematology/Oncology Physician [facility]  (Office):       [phone] (Work cell):  [phone] (Fax):           [phone]

## 2016-11-13 ENCOUNTER — Encounter: Payer: Self-pay | Admitting: Cardiology

## 2016-11-13 ENCOUNTER — Ambulatory Visit (INDEPENDENT_AMBULATORY_CARE_PROVIDER_SITE_OTHER): Payer: Medicare Other | Admitting: Cardiology

## 2016-11-13 VITALS — BP 106/50 | HR 62 | Ht <= 58 in | Wt 209.0 lb

## 2016-11-13 DIAGNOSIS — I5032 Chronic diastolic (congestive) heart failure: Secondary | ICD-10-CM

## 2016-11-13 DIAGNOSIS — R600 Localized edema: Secondary | ICD-10-CM | POA: Diagnosis not present

## 2016-11-13 DIAGNOSIS — I2510 Atherosclerotic heart disease of native coronary artery without angina pectoris: Secondary | ICD-10-CM | POA: Diagnosis not present

## 2016-11-13 DIAGNOSIS — R0609 Other forms of dyspnea: Secondary | ICD-10-CM

## 2016-11-13 DIAGNOSIS — I10 Essential (primary) hypertension: Secondary | ICD-10-CM | POA: Diagnosis not present

## 2016-11-13 LAB — PRO B NATRIURETIC PEPTIDE: NT-Pro BNP: 537 pg/mL (ref 0–738)

## 2016-11-13 LAB — BASIC METABOLIC PANEL
BUN/Creatinine Ratio: 19 (ref 12–28)
BUN: 24 mg/dL (ref 8–27)
CO2: 23 mmol/L (ref 20–29)
Calcium: 9.6 mg/dL (ref 8.7–10.3)
Chloride: 99 mmol/L (ref 96–106)
Creatinine, Ser: 1.26 mg/dL — ABNORMAL HIGH (ref 0.57–1.00)
GFR calc Af Amer: 47 mL/min/{1.73_m2} — ABNORMAL LOW (ref >59)
GFR calc non Af Amer: 41 mL/min/{1.73_m2} — ABNORMAL LOW (ref >59)
Glucose: 99 mg/dL (ref 65–99)
Potassium: 4.2 mmol/L (ref 3.5–5.2)
Sodium: 140 mmol/L (ref 134–144)

## 2016-11-13 NOTE — Progress Notes (Signed)
11/13/2016 [name]   04-27-1940  [id number]  Primary Physician Erin Fess, MD Primary Cardiologist: Dr. Irish Ingram    Reason for Visit/CC: 6 month f/u for CAD and Chronic Diastolic HF  HPI:  Erin Ingram is a 76 y.o. female, followed by Dr. Irish Ingram, with a history of CAD (CABG 2000, DES x2 to SVG-RCA), Low risk NST 03/2016, hypertension, hyperlipidemia, OSA on CPAP, chronic diastolic CHF, morbid obesity, polymyalgia rheumatica treated with prednisone, CKD III, left subclavian stenosis and prior h/oTIA.  She presents to clinic today for routine f/u. Her last OV was 04/2016. She is back for 6 month f/u. She denies CP but notes some mild exertional dyspnea and mild bilateral LEE. She denies orthopnea and PND. No resting dyspnea. She is asymptomatic in clinic today. BP is 106/50. HR is 62 bpm. She reports full med compliance with Lasix. She is on prednisone for polymyalgia rheumatica.    Current Meds  Medication Sig  . amLODipine (NORVASC) 5 MG tablet TAKE 1 TABLET ONCE DAILY.  Marland Kitchen aspirin 81 MG chewable tablet Chew 1 tablet (81 mg total) by mouth daily.  Marland Kitchen atorvastatin (LIPITOR) 10 MG tablet Take 1 tablet (10 mg total) by mouth daily.  . Calcium Glycerophosphate (PRELIEF PO) Take 1 tablet by mouth 3 (three) times daily as needed.   . Cholecalciferol (VITAMIN D PO) Take 1 tablet by mouth daily.  . Cinnamon 500 MG TABS Take 1 each by mouth daily.  . clopidogrel (PLAVIX) 75 MG tablet TAKE 1 TABLET ONCE DAILY.  Marland Kitchen Coenzyme Q10 10 MG capsule Take 10 mg by mouth daily.  . fluocinonide cream (LIDEX) 3.66 % Apply 1 application topically 2 (two) times daily as needed (rash).   . fluticasone (FLONASE) 50 MCG/ACT nasal spray Place 2 sprays into both nostrils daily as needed for allergies.   . furosemide (LASIX) 20 MG tablet Take 40 mg by mouth daily.  . isosorbide mononitrate (IMDUR) 30 MG 24 hr tablet Take 1 tablet (30 mg total) by mouth at bedtime.  . isosorbide mononitrate (IMDUR) 60  MG 24 hr tablet Take 2 tablets (120 mg total) by mouth daily.  Marland Kitchen levocetirizine (XYZAL) 5 MG tablet Take 5 mg by mouth every evening.  Marland Kitchen levothyroxine (SYNTHROID, LEVOTHROID) 75 MCG tablet Take 75 mcg by mouth daily before breakfast.   . Melatonin 1 MG TABS Take 0.5 mg by mouth at bedtime. Reported on 03/22/2015  . metoprolol succinate (TOPROL-XL) 100 MG 24 hr tablet Take 1 tablet (100 mg total) by mouth daily. Please call and schedule August 2018 follow up. [phone]  . mirabegron ER (MYRBETRIQ) 25 MG TB24 tablet Take 50 mg by mouth daily.   . nitroGLYCERIN (NITROSTAT) 0.4 MG SL tablet Place 0.4 mg under the tongue every 5 (five) minutes as needed for chest pain. X 3 doses  . NONFORMULARY OR COMPOUNDED ITEM [facility]:  Achilles Tendonitis Cream - Diclofenac 3%, Baclofen 2%, Bupivacaine 1%, GAbapentin 6%, Ibuprofen 3%, Pentoxifylline 3%, apply 1-2 grams to affected area 3-4 times daily. (Patient taking differently: Apply 1 application topically daily as needed. [facility]:  Achilles Tendonitis Cream - Diclofenac 3%, Baclofen 2%, Bupivacaine 1%, GAbapentin 6%, Ibuprofen 3%, Pentoxifylline 3%, apply 1-2 grams to affected area 3-4 times daily.)  . predniSONE (DELTASONE) 10 MG tablet Take 10 mg by mouth daily with breakfast.   . Psyllium 400 MG CAPS Take 400 mg by mouth daily.  . ranolazine (RANEXA) 1000 MG SR tablet Take 1 tablet (1,000 mg total) by  mouth 2 (two) times daily.  Marland Kitchen venlafaxine XR (EFFEXOR-XR) 75 MG 24 hr capsule Take 225 mg by mouth daily with breakfast. Patient takes 3 capsules  . [DISCONTINUED] furosemide (LASIX) 20 MG tablet Take 1 tablet (20 mg total) by mouth 2 (two) times daily as needed. (Patient taking differently: Take 20 mg by mouth daily as needed. )   Current Facility-Administered Medications for the 11/13/16 encounter (Office Visit) with Erin Pandy, PA-C  Medication  . betamethasone acetate-betamethasone sodium phosphate (CELESTONE) injection 3 mg    Allergies  Allergen Reactions  . Nsaids Other (See Comments)    Due to chronic kidney failure  . Stadol [Butorphanol] Other (See Comments)    agitation Constipation  . Sulfa Antibiotics Rash  . Bisoprolol Fumarate Other (See Comments)    Doesn't remember  . Butorphanol Tartrate Other (See Comments)    Produced a lot of urine, made patient feel crazy  . Codeine Nausea And Vomiting  . Demerol [Meperidine] Nausea And Vomiting  . Imipramine     Sweating, facial dysfunction   . Statins     MYALGIAS  . Tequin [Gatifloxacin] Other (See Comments)    Caused hypoglycemia Low blood sugar  . Brilinta [Ticagrelor] Rash    CAUSES PETECHIAE, PURPURA  . Pseudoephedrine Hcl Palpitations  . Septra [Sulfamethoxazole-Trimethoprim] Rash  . Sulfamethoxazole-Trimethoprim Rash   Past Medical History:  Diagnosis Date  . (HFpEF) heart failure with preserved ejection fraction (HCC)    a. normal EF, LVEDP at [facility] in 6/17 33 >> Lasix started   . Anemia   . Arthritis    "fingers, back, shoulders, hips" (04/10/2016)  . Chronic diastolic CHF (congestive heart failure) ([place])   . CKD (chronic kidney disease), stage III   . Coronary artery disease    a. s/p CABG 2000 (SVG/ free LIMA Y graft to the diagonal and distal LAD, SVG to the OM 1, and SVG to the PDA) . b. Cath 12/19/2014 90% dSVG to RCA s/p 2 overlapping DES. c. LHC 2016, 2017 no intervention except diuresis needed. d. Low risk nuc 03/2016.  . Depression    with anxious component  . GERD (gastroesophageal reflux disease)   . History of hiatal hernia   . Hyperlipidemia   . Hypothyroidism   . Obesity   . OSA on CPAP   . PMR (polymyalgia rheumatica) (HCC)   . Pneumonia    "2-3 times" (04/10/2016)  . Stable angina (HCC)    microvascular, improved with Ranexa  . Subclavian artery stenosis ([place])   . TIA (transient ischemic attack)    Family History  Problem Relation Age of Onset  . Heart disease Mother   . Heart attack Mother   . Heart  disease Father   . Heart attack Father   . Diabetes Brother   . Stroke Paternal Grandmother   . Hypertension Neg Hx    Past Surgical History:  Procedure Laterality Date  . BREAST BIOPSY Right 1964  . CARDIAC CATHETERIZATION  08   patent grafts, no culprit lesions, EF 65%  . CARDIAC CATHETERIZATION N/A 12/19/2014   Procedure: Left Heart Cath and Cors/Grafts Angiography;  Surgeon: Jettie Booze, MD;  Location: [place] [facility];  Service: Cardiovascular;  Laterality: N/A;  . CARDIAC CATHETERIZATION N/A 12/19/2014   Procedure: Coronary Stent Intervention;  Surgeon: Jettie Booze, MD;  Location: [place] [facility];  Service: Cardiovascular;  Laterality: N/A;  . CARDIAC CATHETERIZATION N/A 01/01/2015   Procedure: Left Heart Cath and Cors/Grafts Angiography;  Surgeon: Jettie Booze, MD;  Location: [place] [facility];  Service: Cardiovascular;  Laterality: N/A;  . CARDIAC CATHETERIZATION N/A 09/20/2015   Procedure: Left Heart Cath and Cors/Grafts Angiography;  Surgeon: Jettie Booze, MD;  Location: [place] [facility];  Service: Cardiovascular;  Laterality: N/A;  . CATARACT EXTRACTION W/ INTRAOCULAR LENS  IMPLANT, BILATERAL Bilateral 2011  . COLONOSCOPY WITH PROPOFOL N/A 07/27/2016   Procedure: COLONOSCOPY WITH PROPOFOL;  Surgeon: Wilford Corner, MD;  Location: [place];  Service: Endoscopy;  Laterality: N/A;  . CORONARY ARTERY BYPASS GRAFT  2000   ASCVD, multivessel, S./P.  . DILATION AND CURETTAGE OF UTERUS  1980s  . ESOPHAGOGASTRODUODENOSCOPY (EGD) WITH PROPOFOL N/A 07/27/2016   Procedure: ESOPHAGOGASTRODUODENOSCOPY (EGD) WITH PROPOFOL;  Surgeon: Wilford Corner, MD;  Location: [place];  Service: Endoscopy;  Laterality: N/A;  . FRACTURE SURGERY    . PATELLA FRACTURE SURGERY Left 1993   Social History   Social History  . Marital status: Married    Spouse name: N/A  . Number of children: 2  . Years of education: College   Occupational History  . Retired  Cytogeneticist    Social History Main Topics  . Smoking status: Former Smoker    Packs/day: 0.50    Years: 7.00    Types: Cigarettes    Quit date: 08/21/1969  . Smokeless tobacco: Never Used  . Alcohol use 0.0 oz/week     Comment: rare  . Drug use: No  . Sexual activity: No   Other Topics Concern  . Not on file   Social History Narrative   Lives in [place] with husband.    Right-handed.   1 cup caffeine per day.     Review of Systems: General: negative for chills, fever, night sweats or weight changes.  Cardiovascular: negative for chest pain, dyspnea on exertion, edema, orthopnea, palpitations, paroxysmal nocturnal dyspnea or shortness of breath Dermatological: negative for rash Respiratory: negative for cough or wheezing Urologic: negative for hematuria Abdominal: negative for nausea, vomiting, diarrhea, bright red blood per rectum, melena, or hematemesis Neurologic: negative for visual changes, syncope, or dizziness All other systems reviewed and are otherwise negative except as noted above.   Physical Exam:  Blood pressure (!) 106/50, pulse 62, height 4\' 10"  (1.473 m), weight 209 lb (94.8 kg), SpO2 98 %.  General appearance: alert, cooperative, no distress and morbidly obese Neck: no carotid bruit and no JVD Lungs: clear to auscultation bilaterally Heart: regular rate and rhythm, S1, S2 normal, no murmur, click, rub or gallop Extremities: extremities normal, atraumatic, no cyanosis or edema Pulses: 2+ and symmetric Skin: Skin color, texture, turgor normal. No rashes or lesions Neurologic: Grossly normal  EKG not performed  -- personally reviewed   ASSESSMENT AND PLAN:   1. CAD: history of CAD (CABG 2000, DES x2 to SVG-RCA), Low risk NST 03/2016. Stable w/o CP. Continue medical therapy.   2. Chronic Diastolic HF: Echo 09/6193 showed normal LVEF at 55-60% but g1DD. She notes mild LEE and exertional dyspnea, but no CP, orthopnea or PND. Also no resting dyspnea.  She is on chronic prednisone for polymyalgia rheumatica. We will check a BNP today and a BMP as well, in the event that we may need to adjust her lasix. BP is controlled. Continue low salt diet. Some of her dyspnea my be 2/2 her morbid obesity and deconditioning. We will adjust lasix if abnormal BNP.   3. HTN: controlled on current regimen.   4. H/o Subclavian  Artery Stenosis: denies arm pain. Equal radial pulses bilaterally.   5. OSA: compliant with CPAP.    Follow-Up with Dr. Irish Ingram in 6 months. If we need to adjust diuretics then we will have her f/u sooner w/ APP for f/u and will also need repeat BMP.   Erin Ingram, MHS [facility] 11/13/2016 12:05 PM

## 2016-11-13 NOTE — Patient Instructions (Addendum)
Medication Instructions:  Your physician recommends that you continue on your current medications as directed. Please refer to the Current Medication list given to you today.   Labwork: TODAY:  BMET & PRO BNP  Testing/Procedures: None ordered  Follow-Up: Your physician wants you to follow-up in: [place] DR. VARANASI   You will receive a reminder letter in the mail two months in advance. If you don't receive a letter, please call our office to schedule the follow-up appointment.  Any Other Special Instructions Will Be Listed Below (If Applicable).     If you need a refill on your cardiac medications before your next appointment, please call your pharmacy.

## 2016-12-24 ENCOUNTER — Other Ambulatory Visit: Payer: Self-pay | Admitting: Interventional Cardiology

## 2016-12-25 ENCOUNTER — Other Ambulatory Visit: Payer: Self-pay | Admitting: Cardiology

## 2016-12-25 MED ORDER — METOPROLOL SUCCINATE ER 100 MG PO TB24: 100.0000 mg | ORAL_TABLET | Freq: Every day | ORAL | 3 refills | Status: DC

## 2017-01-14 ENCOUNTER — Encounter (HOSPITAL_COMMUNITY): Payer: Self-pay

## 2017-01-14 ENCOUNTER — Emergency Department (HOSPITAL_COMMUNITY): Payer: Medicare Other

## 2017-01-14 ENCOUNTER — Emergency Department (HOSPITAL_COMMUNITY)
Admission: EM | Admit: 2017-01-14 | Discharge: 2017-01-14 | Disposition: A | Discharge status: Home / Self Care | Payer: Medicare Other | Attending: Emergency Medicine | Admitting: Emergency Medicine

## 2017-01-14 DIAGNOSIS — S0990XA Unspecified injury of head, initial encounter: Secondary | ICD-10-CM | POA: Diagnosis present

## 2017-01-14 DIAGNOSIS — W19XXXA Unspecified fall, initial encounter: Secondary | ICD-10-CM

## 2017-01-14 DIAGNOSIS — E039 Hypothyroidism, unspecified: Secondary | ICD-10-CM | POA: Diagnosis not present

## 2017-01-14 DIAGNOSIS — N183 Chronic kidney disease, stage 3 (moderate): Secondary | ICD-10-CM | POA: Diagnosis not present

## 2017-01-14 DIAGNOSIS — S0003XA Contusion of scalp, initial encounter: Secondary | ICD-10-CM | POA: Insufficient documentation

## 2017-01-14 DIAGNOSIS — Z7982 Long term (current) use of aspirin: Secondary | ICD-10-CM | POA: Insufficient documentation

## 2017-01-14 DIAGNOSIS — I5032 Chronic diastolic (congestive) heart failure: Secondary | ICD-10-CM | POA: Insufficient documentation

## 2017-01-14 DIAGNOSIS — I2510 Atherosclerotic heart disease of native coronary artery without angina pectoris: Secondary | ICD-10-CM | POA: Insufficient documentation

## 2017-01-14 DIAGNOSIS — Y999 Unspecified external cause status: Secondary | ICD-10-CM | POA: Insufficient documentation

## 2017-01-14 DIAGNOSIS — Z955 Presence of coronary angioplasty implant and graft: Secondary | ICD-10-CM | POA: Diagnosis not present

## 2017-01-14 DIAGNOSIS — Y9301 Activity, walking, marching and hiking: Secondary | ICD-10-CM | POA: Insufficient documentation

## 2017-01-14 DIAGNOSIS — W0110XA Fall on same level from slipping, tripping and stumbling with subsequent striking against unspecified object, initial encounter: Secondary | ICD-10-CM | POA: Insufficient documentation

## 2017-01-14 DIAGNOSIS — Z87891 Personal history of nicotine dependence: Secondary | ICD-10-CM | POA: Diagnosis not present

## 2017-01-14 DIAGNOSIS — Z79899 Other long term (current) drug therapy: Secondary | ICD-10-CM | POA: Insufficient documentation

## 2017-01-14 DIAGNOSIS — Y92121 Bathroom in nursing home as the place of occurrence of the external cause: Secondary | ICD-10-CM | POA: Insufficient documentation

## 2017-01-14 DIAGNOSIS — Z7902 Long term (current) use of antithrombotics/antiplatelets: Secondary | ICD-10-CM | POA: Diagnosis not present

## 2017-01-14 MED ORDER — BACITRACIN ZINC 500 UNIT/GM EX OINT: 1.0000 "application " | TOPICAL_OINTMENT | Freq: Two times a day (BID) | CUTANEOUS | Status: DC

## 2017-01-14 NOTE — ED Notes (Signed)
Patient transported to CT 

## 2017-01-14 NOTE — ED Notes (Signed)
Dr. Little at bedside.  

## 2017-01-14 NOTE — ED Provider Notes (Signed)
[facility] EMERGENCY DEPARTMENT Provider Note   CSN: [id number] Arrival date & time: 01/14/17  1718     History   Chief Complaint Chief Complaint  Patient presents with  . Fall    HPI Erin Ingram is a 76 y.o. female.  76yo F w/ extensive PMH including HFpEF, CKD, CAD, TIA who p/w fall.  Just prior to arrival, the patient was at her nursing facility going to the bathroom and lost her balance, falling and twisting around, striking her posterior head during the fall.  She did not lose consciousness.  When she tried to sit up she became dizzy with nausea but no vomiting.  She denies any extremity pain or weakness, vision changes, chest pain or abdominal pain.  EMS reported low-normal pressure initially, gave 5103ml IVF, zofran. BG normal. Patient takes Plavix but no other anticoagulation.  She states her tetanus is up-to-date.  She did sustain skin tear to her right elbow.   The history is provided by the patient.    Past Medical History:  Diagnosis Date  . (HFpEF) heart failure with preserved ejection fraction (HCC)    a. normal EF, LVEDP at [facility] in 6/17 33 >> Lasix started   . Anemia   . Arthritis    "fingers, back, shoulders, hips" (04/10/2016)  . Chronic diastolic CHF (congestive heart failure) ([place])   . CKD (chronic kidney disease), stage III ([place])   . Coronary artery disease    a. s/p CABG 2000 (SVG/ free LIMA Y graft to the diagonal and distal LAD, SVG to the OM 1, and SVG to the PDA) . b. Cath 12/19/2014 90% dSVG to RCA s/p 2 overlapping DES. c. LHC 2016, 2017 no intervention except diuresis needed. d. Low risk nuc 03/2016.  . Depression    with anxious component  . GERD (gastroesophageal reflux disease)   . History of hiatal hernia   . Hyperlipidemia   . Hypothyroidism   . Obesity   . OSA on CPAP   . PMR (polymyalgia rheumatica) (HCC)   . Pneumonia    "2-3 times" (04/10/2016)  . Stable angina (HCC)    microvascular, improved with Ranexa  .  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 TIA (transient ischemic attack)     Patient Active Problem List   Diagnosis Date Noted  . Dysphagia 07/27/2016  . Irritable bowel syndrome with diarrhea 07/27/2016  . Chest pain 04/10/2016  . Unstable angina pectoris ([place]) 04/10/2016  . Bilateral lower extremity edema 03/13/2016  . (HFpEF) heart failure with preserved ejection fraction ([place]) 10/08/2015  . CAD (coronary artery disease) 10/08/2015  . Chest pain at rest   . Subclavian arterial stenosis ([place])   . Dyslipidemia   . Hyperlipidemia LDL goal <70   . PAD (peripheral artery disease) ([place]) 04/11/2015  . Essential hypertension 04/11/2014  . Morbid obesity ([place]) 03/22/2013  . Anemia, unspecified 03/22/2013  . Depressive disorder, not elsewhere classified 03/22/2013  . Generalized osteoarthrosis, unspecified site 03/22/2013  . Unspecified sleep apnea 03/22/2013  . Impaired fasting glucose 03/22/2013  . 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 Encounter for long-term (current) use of other medications 03/22/2013    Past Surgical History:  Procedure Laterality Date  . BREAST BIOPSY Right 1964  . CARDIAC CATHETERIZATION  08   patent grafts, no culprit lesions, EF 65%  . CARDIAC CATHETERIZATION N/A 12/19/2014   Procedure: Left Heart Cath and Cors/Grafts Angiography;  Surgeon: Jettie Booze, MD;  Location: [facility];  Service: Cardiovascular;  Laterality: N/A;  . CARDIAC CATHETERIZATION N/A 12/19/2014   Procedure: Coronary Stent Intervention;  Surgeon: Jettie Booze, MD;  Location: [facility];  Service: Cardiovascular;  Laterality: N/A;  . CARDIAC CATHETERIZATION N/A 01/01/2015   Procedure: Left Heart Cath and Cors/Grafts Angiography;  Surgeon: Jettie Booze, MD;  Location: [facility];  Service: Cardiovascular;  Laterality: N/A;  . CARDIAC CATHETERIZATION N/A 09/20/2015   Procedure: Left Heart Cath and Cors/Grafts Angiography;  Surgeon: Jettie Booze, MD;  Location: [facility];  Service: Cardiovascular;  Laterality: N/A;  . CATARACT EXTRACTION W/ INTRAOCULAR LENS  IMPLANT, BILATERAL Bilateral 2011  . COLONOSCOPY WITH PROPOFOL N/A 07/27/2016   Procedure: COLONOSCOPY WITH PROPOFOL;  Surgeon: Wilford Corner, MD;  Location: [facility];  Service: Endoscopy;  Laterality: N/A;  . CORONARY ARTERY BYPASS GRAFT  2000   ASCVD, multivessel, S./P.  . DILATION AND CURETTAGE OF UTERUS  1980s  . ESOPHAGOGASTRODUODENOSCOPY (EGD) WITH PROPOFOL N/A 07/27/2016   Procedure: ESOPHAGOGASTRODUODENOSCOPY (EGD) WITH PROPOFOL;  Surgeon: Wilford Corner, MD;  Location: [facility];  Service: Endoscopy;  Laterality: N/A;  . FRACTURE SURGERY    . PATELLA FRACTURE SURGERY Left 1993    OB History    No data available       Home Medications    Prior to Admission medications   Medication Sig Start Date End Date Taking? Authorizing Provider  alendronate (FOSAMAX) 70 MG tablet Take 70 mg by mouth once a week. Take with a full glass of water on an empty stomach.   Yes [provider]  amLODipine (NORVASC) 5 MG tablet TAKE 1 TABLET DAILY. Patient taking differently: TAKE 5 mg TABLET DAILY. 12/25/16  Yes Jettie Booze, MD  aspirin 81 MG chewable tablet Chew 1 tablet (81 mg total) by mouth daily. 12/20/14  Yes Almyra Deforest, PA  atorvastatin (LIPITOR) 10 MG tablet Take 1 tablet (10 mg total) by mouth daily. Patient taking differently: Take 10 mg by mouth at bedtime.  10/07/16  Yes Dunn, Nedra Hai, PA-C  Calcium Glycerophosphate (PRELIEF PO) Take 1 tablet by mouth 3 (three) times daily as needed.    Yes [provider]  Cholecalciferol (VITAMIN D PO) Take 1 tablet by mouth daily.   Yes [provider]  Cinnamon 500 MG TABS Take 1 each by mouth daily.   Yes [provider]  clopidogrel (PLAVIX) 75 MG tablet TAKE 1 TABLET ONCE DAILY. Patient taking differently: TAKE 75 mg TABLET ONCE DAILY. 12/25/16  Yes Jettie Booze, MD  Coenzyme Q10 10 MG capsule Take 10 mg by mouth at bedtime.    Yes [provider]  fluocinonide cream (LIDEX) 6.26 % Apply 1 application topically 2 (two) times daily as needed (rash).    Yes [provider]  fluticasone (FLONASE) 50 MCG/ACT nasal spray Place 2 sprays into both nostrils daily as needed for allergies.    Yes [provider]  furosemide (LASIX) 20 MG tablet Take 40 mg by mouth daily.   Yes [provider]  isosorbide mononitrate (IMDUR) 60 MG 24 hr tablet Take 2 tablets (120 mg total) by mouth daily. Patient taking differently: Take 120 mg by mouth See admin instructions. Take 120 mg In the morning and 60 mg in the evening 10/09/16 01/14/17 Yes Jettie Booze, MD  levocetirizine (XYZAL) 5 MG tablet Take 5 mg by mouth every evening.   Yes [provider]  levothyroxine (SYNTHROID, LEVOTHROID) 75 MCG tablet  Take 75 mcg by mouth daily before breakfast.    Yes [provider]  Melatonin 1 MG TABS Take 0.5 mg by mouth at bedtime. Reported on 03/22/2015   Yes [provider]  metoprolol succinate (TOPROL-XL) 100 MG 24 hr tablet Take 1 tablet (100 mg total) by mouth daily. Please call and schedule August 2018 follow up. [phone] Patient taking differently: Take 100 mg by mouth at bedtime. Please call and schedule August 2018 follow up. [phone] 12/25/16  Yes Rosita Fire, Brittainy M, PA-C  mirabegron ER (MYRBETRIQ) 50 MG TB24 tablet Take 50 mg by mouth at bedtime.    Yes [provider]  nitroGLYCERIN (NITROSTAT) 0.4 MG SL tablet Place 0.4 mg under the tongue every 5 (five) minutes as needed for chest pain. X 3 doses   Yes [provider]  predniSONE (DELTASONE) 10 MG tablet Take 7.5 mg by mouth daily with breakfast.    Yes [provider]  Psyllium 400 MG CAPS Take 2,800 mg by mouth daily.    Yes [provider]  ranolazine (RANEXA) 1000 MG SR tablet Take 1 tablet (1,000  mg total) by mouth 2 (two) times daily. 10/07/16  Yes Dunn, Dayna N, PA-C  venlafaxine XR (EFFEXOR-XR) 75 MG 24 hr capsule Take 225 mg by mouth daily with breakfast. Patient takes 3 capsules   Yes [provider]  isosorbide mononitrate (IMDUR) 30 MG 24 hr tablet Take 1 tablet (30 mg total) by mouth at bedtime. Patient not taking: Reported on 01/14/2017 10/07/16   Charlie Pitter, PA-C  NONFORMULARY OR COMPOUNDED ITEM [facility]:  [place] Tendonitis Cream - Diclofenac 3%, Baclofen 2%, Bupivacaine 1%, GAbapentin 6%, Ibuprofen 3%, Pentoxifylline 3%, apply 1-2 grams to affected area 3-4 times daily. Patient not taking: Reported on 01/14/2017 02/28/16   Edrick Kins, DPM    Family History Family History  Problem Relation Age of Onset  . Heart disease Mother   . Heart attack Mother   . Heart disease Father   . Heart attack Father   . Diabetes Brother   . Stroke Paternal Grandmother   . Hypertension Neg Hx     Social History Social History  Substance Use Topics  . Smoking status: Former Smoker    Packs/day: 0.50    Years: 7.00    Types: Cigarettes    Quit date: 08/21/1969  . Smokeless tobacco: Never Used  . Alcohol use 0.0 oz/week     Comment: rare     Allergies   Nsaids; Stadol [butorphanol]; Sulfa antibiotics; Bisoprolol fumarate; Butorphanol tartrate; Codeine; Demerol [meperidine]; Imipramine; Statins; Tequin [gatifloxacin]; Brilinta [ticagrelor]; Pseudoephedrine hcl; Septra [sulfamethoxazole-trimethoprim]; and Sulfamethoxazole-trimethoprim   Review of Systems Review of Systems All other systems reviewed and are negative except that which was mentioned in HPI   Physical Exam Updated Vital Signs BP (!) 112/53   Pulse 70   Temp (!) 97.5 F (36.4 C) (Oral)   Resp 18   SpO2 98%   Physical Exam  Constitutional: She is oriented to person, place, and time. She appears well-developed and well-nourished. No distress.  Awake, alert  HENT:  Head: Normocephalic.    Mouth/Throat: Oropharynx is clear and moist.  Hematoma posterior scalp  Eyes: Pupils are equal, round, and reactive to light. Conjunctivae and EOM are normal.  Neck: Neck supple.  No midline spinal tenderness  Cardiovascular: Normal rate, regular rhythm and normal heart sounds.   No murmur heard. Pulmonary/Chest: Effort normal and breath sounds normal. No respiratory distress.  Abdominal:  Soft. Bowel sounds are normal. She exhibits no distension. There is no tenderness.  Musculoskeletal: Normal range of motion. She exhibits no edema.  Neurological: She is alert and oriented to person, place, and time. She has normal reflexes. No cranial nerve deficit. She exhibits normal muscle tone.  Fluent speech, normal finger-to-nose testing 5/5 strength and normal sensation x all 4 extremities  Skin: Skin is warm and dry.  Skin tear right elbow  Psychiatric: She has a normal mood and affect. Judgment and thought content normal.  Nursing note and vitals reviewed.    ED Treatments / Results  Labs (all labs ordered are listed, but only abnormal results are displayed) Labs Reviewed - No data to display  EKG  EKG Interpretation None       Radiology Ct Head Wo Contrast  Result Date: 01/14/2017 CLINICAL DATA:  Pt sts she lost her balance in the bedroom and fell. Sts she was able to hold on to the door reducing severity of impact. C/O " knock to the back of head. " EXAM: CT HEAD WITHOUT CONTRAST CT CERVICAL SPINE WITHOUT CONTRAST TECHNIQUE: Multidetector CT imaging of the head and cervical spine was performed following the standard protocol without intravenous contrast. Multiplanar CT image reconstructions of the cervical spine were also generated. COMPARISON:  None. FINDINGS: CT HEAD FINDINGS Brain: No intracranial hemorrhage. No parenchymal contusion. No midline shift or mass effect. Basilar cisterns are patent. No skull base fracture. No fluid in the paranasal sinuses or mastoid air cells.  Orbits are normal. Vascular: No hyperdense vessel or unexpected calcification. Skull: Normal. Negative for fracture or focal lesion. Sinuses/Orbits: Paranasal sinuses and mastoid air cells are clear. Orbits are clear. Other: Small scalp hematoma over the posterior parietal bone measures only 5 mm in depth CT CERVICAL SPINE FINDINGS Alignment: Normal alignment of the cervical vertebral bodies. Skull base and vertebrae: Normal craniocervical junction. No loss of bowel vertebral body height or disc height. Normal facet articulation. No evidence of fracture. Soft tissues and spinal canal: No prevertebral soft tissue swelling. No perispinal or epidural hematoma. Disc levels:  Unremarkable Upper chest: Clear Other: None IMPRESSION: 1. No intracranial trauma. 2. Small posterior scalp hematoma. 3. No cervical spine fracture. Electronically Signed   By: Suzy Bouchard M.D.   On: 01/14/2017 18:44   Ct Cervical Spine Wo Contrast  Result Date: 01/14/2017 CLINICAL DATA:  Pt sts she lost her balance in the bedroom and fell. Sts she was able to hold on to the door reducing severity of impact. C/O " knock to the back of head. " EXAM: CT HEAD WITHOUT CONTRAST CT CERVICAL SPINE WITHOUT CONTRAST TECHNIQUE: Multidetector CT imaging of the head and cervical spine was performed following the standard protocol without intravenous contrast. Multiplanar CT image reconstructions of the cervical spine were also generated. COMPARISON:  None. FINDINGS: CT HEAD FINDINGS Brain: No intracranial hemorrhage. No parenchymal contusion. No midline shift or mass effect. Basilar cisterns are patent. No skull base fracture. No fluid in the paranasal sinuses or mastoid air cells. Orbits are normal. Vascular: No hyperdense vessel or unexpected calcification. Skull: Normal. Negative for fracture or focal lesion. Sinuses/Orbits: Paranasal sinuses and mastoid air cells are clear. Orbits are clear. Other: Small scalp hematoma over the posterior parietal  bone measures only 5 mm in depth CT CERVICAL SPINE FINDINGS Alignment: Normal alignment of the cervical vertebral bodies. Skull base and vertebrae: Normal craniocervical junction. No loss of bowel vertebral body height or disc height. Normal facet articulation. No evidence of  fracture. Soft tissues and spinal canal: No prevertebral soft tissue swelling. No perispinal or epidural hematoma. Disc levels:  Unremarkable Upper chest: Clear Other: None IMPRESSION: 1. No intracranial trauma. 2. Small posterior scalp hematoma. 3. No cervical spine fracture. Electronically Signed   By: Suzy Bouchard M.D.   On: 01/14/2017 18:44    Procedures Procedures (including critical care time)  Medications Ordered in ED Medications  bacitracin ointment 1 application (not administered)     Initial Impression / Assessment and Plan / ED Course  I have reviewed the triage vital signs and the nursing notes.  Pertinent imaging results that were available during my care of the patient were reviewed by me and considered in my medical decision making (see chart for details).    Pt w/ closed head injury after mechanical fall, no LOC. She was neurologically intact on exam with normal vital signs.  She had a hematoma on her posterior scalp and one small skin tear, no other obvious injuries.  Because of evidence of trauma and her dizziness with nausea, obtain CT of head and C-spine to rule out acute injury.  Imaging negative for acute injury. Pt well appearing and pleasant on re-examination.  I discussed supportive measures and extensively reviewed return precautions regarding signs of infection of skin tear or any new neurologic symptoms such as vomiting, confusion, balance problems, or speech problems.  Patient voiced understanding and was discharged in satisfactory condition.  Final Clinical Impressions(s) / ED Diagnoses   Final diagnoses:  Closed head injury, initial encounter  Fall, initial encounter    New  Prescriptions New Prescriptions   No medications on file     Ruie Sendejo, Wenda Overland, MD 01/14/17 1941

## 2017-01-14 NOTE — ED Triage Notes (Signed)
Pt was going to he bathroom and lost her balance and hit head as she tried to catch herself. Pt is having dizziness and nausea that was relieved by 4mg  zofran with EMS

## 2017-01-14 NOTE — ED Notes (Signed)
Pt has swelling to posterior right head. No bleeding noted. No boggy feeling upon palpation noted.

## 2017-01-21 ENCOUNTER — Telehealth: Payer: Self-pay | Admitting: Physician Assistant

## 2017-01-21 NOTE — Telephone Encounter (Signed)
Left pt a message for her to call back and verify her Imdur dose.

## 2017-01-21 NOTE — Telephone Encounter (Signed)
Received fax from [facility] that patient has filled Imdur for varying doses in August, Sept, and October 2018 under both myself and Dr. Angelena Form. Last note somewhat unclear - note lists Imdur 120mg  daily and 30mg  daily but then specialized notes from 01/14/17 say she's doing 120mg  QAM and 60mg  QPM. Can you please clarify what dose of Isosorbide she is on so we can consolidate RX into one? Thanks. Dayna Dunn PA-C

## 2017-01-22 NOTE — Telephone Encounter (Signed)
The last time I saw her in clinic 04/2016 she was only on Imdur 30mg  daily. However, phone notes from 09/2016 raised confusion about the dose also (at one point was on 60mg QAM/90mg  QPM). At [facility] 11/13/16 with Lyda Jester she was on 120mg  daily and 30mg  at bedtime. I would be fine with her trying to cut out the PM dose to see if it helps with the dizziness. However I also see she had a fall/head injury in October so if she is having dizziness, needs to get into see her PCP to make sure no acute issues related tot his recent fall. Can also offer additional appt with our office to check this out but seeing PCP would be important as well. Thanks. Jarman Litton PA-C

## 2017-01-22 NOTE — Telephone Encounter (Signed)
Follow up    Patient calling back regarding isosorbide mononitrate (IMDUR) 30 MG 24 hr tablet dosage.

## 2017-01-22 NOTE — Telephone Encounter (Signed)
Pt states current Imdur dose:  2 of a 60 mg (total 120 mg ) in the morning 1 of a 30 mg tablet in the evening.  Pt states some people have questioned this dose, suggesting stopping the 30 mg in the evening, she has had some dizziness recently, BP has been in the 120/60 range.  Pt advised I will forward to [facility] for review.

## 2017-01-22 NOTE — Telephone Encounter (Signed)
I discussed Erin Ingram's recommendations with pt. Pt states she has seen her PCP in follow-up after her fall in October 2018. Pt states she is going to go ahead and stop Imdur 30 mg evening dose, will monitor BP, dizziness, chest pain symptoms. I offered pt follow-up appt here, she did not feel that was necessary at this time, she did go ahead and schedule 6 month office visit  with Dr Irish Lack in February 2019.

## 2017-01-22 NOTE — Telephone Encounter (Signed)
LMTCB

## 2017-03-26 ENCOUNTER — Other Ambulatory Visit: Payer: Self-pay | Admitting: Interventional Cardiology

## 2017-03-26 ENCOUNTER — Other Ambulatory Visit: Payer: Self-pay | Admitting: Physician Assistant

## 2017-04-04 ENCOUNTER — Emergency Department (HOSPITAL_COMMUNITY): Payer: Medicare Other

## 2017-04-04 ENCOUNTER — Emergency Department (HOSPITAL_COMMUNITY)
Admission: EM | Admit: 2017-04-04 | Discharge: 2017-04-04 | Disposition: A | Discharge status: Home / Self Care | Payer: Medicare Other | Attending: Emergency Medicine | Admitting: Emergency Medicine

## 2017-04-04 ENCOUNTER — Encounter (HOSPITAL_COMMUNITY): Payer: Self-pay | Admitting: Pharmacy Technician

## 2017-04-04 DIAGNOSIS — R2689 Other abnormalities of gait and mobility: Secondary | ICD-10-CM | POA: Insufficient documentation

## 2017-04-04 DIAGNOSIS — Z79899 Other long term (current) drug therapy: Secondary | ICD-10-CM | POA: Insufficient documentation

## 2017-04-04 DIAGNOSIS — Y939 Activity, unspecified: Secondary | ICD-10-CM | POA: Diagnosis not present

## 2017-04-04 DIAGNOSIS — I5032 Chronic diastolic (congestive) heart failure: Secondary | ICD-10-CM | POA: Diagnosis not present

## 2017-04-04 DIAGNOSIS — I130 Hypertensive heart and chronic kidney disease with heart failure and stage 1 through stage 4 chronic kidney disease, or unspecified chronic kidney disease: Secondary | ICD-10-CM | POA: Diagnosis not present

## 2017-04-04 DIAGNOSIS — Z951 Presence of aortocoronary bypass graft: Secondary | ICD-10-CM | POA: Insufficient documentation

## 2017-04-04 DIAGNOSIS — R400 Somnolence: Secondary | ICD-10-CM | POA: Diagnosis not present

## 2017-04-04 DIAGNOSIS — Z87891 Personal history of nicotine dependence: Secondary | ICD-10-CM | POA: Diagnosis not present

## 2017-04-04 DIAGNOSIS — E039 Hypothyroidism, unspecified: Secondary | ICD-10-CM | POA: Insufficient documentation

## 2017-04-04 DIAGNOSIS — Z8673 Personal history of transient ischemic attack (TIA), and cerebral infarction without residual deficits: Secondary | ICD-10-CM | POA: Insufficient documentation

## 2017-04-04 DIAGNOSIS — Z7902 Long term (current) use of antithrombotics/antiplatelets: Secondary | ICD-10-CM | POA: Insufficient documentation

## 2017-04-04 DIAGNOSIS — Z7982 Long term (current) use of aspirin: Secondary | ICD-10-CM | POA: Insufficient documentation

## 2017-04-04 DIAGNOSIS — M545 Low back pain, unspecified: Secondary | ICD-10-CM

## 2017-04-04 DIAGNOSIS — N183 Chronic kidney disease, stage 3 (moderate): Secondary | ICD-10-CM | POA: Diagnosis not present

## 2017-04-04 DIAGNOSIS — I2510 Atherosclerotic heart disease of native coronary artery without angina pectoris: Secondary | ICD-10-CM | POA: Diagnosis not present

## 2017-04-04 DIAGNOSIS — W19XXXA Unspecified fall, initial encounter: Secondary | ICD-10-CM

## 2017-04-04 DIAGNOSIS — R42 Dizziness and giddiness: Secondary | ICD-10-CM | POA: Insufficient documentation

## 2017-04-04 DIAGNOSIS — Y92129 Unspecified place in nursing home as the place of occurrence of the external cause: Secondary | ICD-10-CM | POA: Insufficient documentation

## 2017-04-04 DIAGNOSIS — Y999 Unspecified external cause status: Secondary | ICD-10-CM | POA: Diagnosis not present

## 2017-04-04 DIAGNOSIS — W1839XA Other fall on same level, initial encounter: Secondary | ICD-10-CM | POA: Diagnosis not present

## 2017-04-04 LAB — CBC WITH DIFFERENTIAL/PLATELET
Basophils Absolute: 0 10*3/uL (ref 0.0–0.1)
Basophils Relative: 0 %
Eosinophils Absolute: 0 10*3/uL (ref 0.0–0.7)
Eosinophils Relative: 0 %
HCT: 33.4 % — ABNORMAL LOW (ref 36.0–46.0)
Hemoglobin: 10.7 g/dL — ABNORMAL LOW (ref 12.0–15.0)
Lymphocytes Relative: 10 %
Lymphs Abs: 1.2 10*3/uL (ref 0.7–4.0)
MCH: 29.7 pg (ref 26.0–34.0)
MCHC: 32 g/dL (ref 30.0–36.0)
MCV: 92.8 fL (ref 78.0–100.0)
Monocytes Absolute: 0.4 10*3/uL (ref 0.1–1.0)
Monocytes Relative: 3 %
Neutro Abs: 10.6 10*3/uL — ABNORMAL HIGH (ref 1.7–7.7)
Neutrophils Relative %: 87 %
Platelets: 247 10*3/uL (ref 150–400)
RBC: 3.6 MIL/uL — ABNORMAL LOW (ref 3.87–5.11)
RDW: 14.2 % (ref 11.5–15.5)
WBC: 12.2 10*3/uL — ABNORMAL HIGH (ref 4.0–10.5)

## 2017-04-04 LAB — BASIC METABOLIC PANEL
Anion gap: 10 (ref 5–15)
BUN: 21 mg/dL — ABNORMAL HIGH (ref 6–20)
CO2: 24 mmol/L (ref 22–32)
Calcium: 8.6 mg/dL — ABNORMAL LOW (ref 8.9–10.3)
Chloride: 105 mmol/L (ref 101–111)
Creatinine, Ser: 1.37 mg/dL — ABNORMAL HIGH (ref 0.44–1.00)
GFR calc Af Amer: 42 mL/min — ABNORMAL LOW (ref >60)
GFR calc non Af Amer: 36 mL/min — ABNORMAL LOW (ref >60)
Glucose, Bld: 132 mg/dL — ABNORMAL HIGH (ref 65–99)
Potassium: 4.4 mmol/L (ref 3.5–5.1)
Sodium: 139 mmol/L (ref 135–145)

## 2017-04-04 MED ORDER — ACETAMINOPHEN 325 MG PO TABS
650.0000 mg | ORAL_TABLET | Freq: Once | ORAL | Status: AC
  Administered 2017-04-04: 650 mg via ORAL
  Filled 2017-04-04: qty 2

## 2017-04-04 NOTE — Discharge Instructions (Signed)
Your evaluated in the emergency department for a fall.  You had CAT scans of your head and neck and x-rays of your chest and lower back.  These did not show any acute findings.  You also had some lab work that was unremarkable.  You ambulated here and felt like you could go home.  We recommend ice to areas of your back that are sore and Tylenol for pain.  You should follow-up with your doctor and please return if any worsening symptoms.

## 2017-04-04 NOTE — ED Triage Notes (Signed)
Pt arrives via [facility] from [facility] after fall. Pt fell forward while walking with her walker. Pt sore between shoulder blades. Arrives in soft collar. Pt states she has tremors in her hands and feet and just tripped. Pt did not hit head. Does take plavix. VSS with EMS. Pt reports increased dizziness, sleepiness, and "wobbly" on her feet the last 2-3 days. Pt A&OX4 upon arrival.

## 2017-04-04 NOTE — ED Notes (Signed)
Spoke with Erin Ingram at [facility] where pt lives and she states to have pt take a cab home, they have vouchers for the pt.

## 2017-04-04 NOTE — ED Notes (Signed)
pt back from CT

## 2017-04-04 NOTE — ED Provider Notes (Signed)
[facility] EMERGENCY DEPARTMENT Provider Note   CSN: [id number] Arrival date & time:        History   Chief Complaint Chief Complaint  Patient presents with  . Fall    HPI Erin Ingram is a 77 y.o. female.  The history is provided by the patient and the EMS personnel.  Fall  This is a new problem. The current episode started 1 to 2 hours ago. The problem occurs constantly. The problem has not changed since onset.Pertinent negatives include no chest pain, no abdominal pain, no headaches and no shortness of breath. The symptoms are aggravated by twisting. Nothing relieves the symptoms. She has tried nothing for the symptoms.     77 year old female complaining of a fall while using her walker at home.  She states she was using her walker when she had some tremor in her hands and she fell to the ground.  She is on Plavix.  She denies loss of consciousness.  She states she was helped to her feet by other residents.  She is mostly worried about the spasms that she gets in her hand that are slow sometimes happen in her mouth and throat.  She states this is been going on a long time but she is neglected to mention them to her primary care doctor.  As far as the fall goes she is complaining of some paralumbar tenderness.  There is no numbness or weakness no incontinence.  She denies headache neck pain chest pain abdominal pain.  The back pain is moderate intensity sharp and is associated with movement and is a new problem.  Past Medical History:  Diagnosis Date  . (HFpEF) heart failure with preserved ejection fraction (HCC)    a. normal EF, LVEDP at [facility] in 6/17 33 >> Lasix started   . Anemia   . Arthritis    "fingers, back, shoulders, hips" (04/10/2016)  . Chronic diastolic CHF (congestive heart failure) ([place])   . CKD (chronic kidney disease), stage III ([place])   . Coronary artery disease    a. s/p CABG 2000 (SVG/ free LIMA Y graft to the diagonal and distal LAD, SVG  to the OM 1, and SVG to the PDA) . b. Cath 12/19/2014 90% dSVG to RCA s/p 2 overlapping DES. c. LHC 2016, 2017 no intervention except diuresis needed. d. Low risk nuc 03/2016.  . Depression    with anxious component  . GERD (gastroesophageal reflux disease)   . History of hiatal hernia   . Hyperlipidemia   . Hypothyroidism   . Obesity   . OSA on CPAP   . PMR (polymyalgia rheumatica) (HCC)   . Pneumonia    "2-3 times" (04/10/2016)  . Stable angina (HCC)    microvascular, improved with Ranexa  . Subclavian artery stenosis ([place])   . TIA (transient ischemic attack)     Patient Active Problem List   Diagnosis Date Noted  . Dysphagia 07/27/2016  . Irritable bowel syndrome with diarrhea 07/27/2016  . Chest pain 04/10/2016  . Unstable angina pectoris ([place]) 04/10/2016  . Bilateral lower extremity edema 03/13/2016  . (HFpEF) heart failure with preserved ejection fraction ([place]) 10/08/2015  . CAD (coronary artery disease) 10/08/2015  . Chest pain at rest   . Subclavian arterial stenosis ([place])   . Dyslipidemia   . Hyperlipidemia LDL goal <70   . PAD (peripheral artery disease) ([place]) 04/11/2015  . Essential hypertension 04/11/2014  . Morbid obesity ([place]) 03/22/2013  . Anemia, unspecified  03/22/2013  . Depressive disorder, not elsewhere classified 03/22/2013  . Generalized osteoarthrosis, unspecified site 03/22/2013  . Unspecified sleep apnea 03/22/2013  . Impaired fasting glucose 03/22/2013  . 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 Encounter for long-term (current) use of other medications 03/22/2013    Past Surgical History:  Procedure Laterality Date  . BREAST BIOPSY Right 1964  . CARDIAC CATHETERIZATION  08   patent grafts, no culprit lesions, EF 65%  . CARDIAC CATHETERIZATION N/A 12/19/2014   Procedure: Left Heart Cath and Cors/Grafts Angiography;  Surgeon: Jettie Booze, MD;  Location: [facility] CV LAB;  Service: Cardiovascular;  Laterality: N/A;  . CARDIAC  CATHETERIZATION N/A 12/19/2014   Procedure: Coronary Stent Intervention;  Surgeon: Jettie Booze, MD;  Location: [facility];  Service: Cardiovascular;  Laterality: N/A;  . CARDIAC CATHETERIZATION N/A 01/01/2015   Procedure: Left Heart Cath and Cors/Grafts Angiography;  Surgeon: Jettie Booze, MD;  Location: [facility] CV [facility];  Service: Cardiovascular;  Laterality: N/A;  . CARDIAC CATHETERIZATION N/A 09/20/2015   Procedure: Left Heart Cath and Cors/Grafts Angiography;  Surgeon: Jettie Booze, MD;  Location: [facility] CV LAB;  Service: Cardiovascular;  Laterality: N/A;  . CATARACT EXTRACTION W/ INTRAOCULAR LENS  IMPLANT, BILATERAL Bilateral 2011  . COLONOSCOPY WITH PROPOFOL N/A 07/27/2016   Procedure: COLONOSCOPY WITH PROPOFOL;  Surgeon: Wilford Corner, MD;  Location: [facility];  Service: Endoscopy;  Laterality: N/A;  . CORONARY ARTERY BYPASS GRAFT  2000   ASCVD, multivessel, S./P.  . DILATION AND CURETTAGE OF UTERUS  1980s  . ESOPHAGOGASTRODUODENOSCOPY (EGD) WITH PROPOFOL N/A 07/27/2016   Procedure: ESOPHAGOGASTRODUODENOSCOPY (EGD) WITH PROPOFOL;  Surgeon: Wilford Corner, MD;  Location: [facility];  Service: Endoscopy;  Laterality: N/A;  . FRACTURE SURGERY    . PATELLA FRACTURE SURGERY Left 1993    OB History    No data available       Home Medications    Prior to Admission medications   Medication Sig Start Date End Date Taking? Authorizing Provider  alendronate (FOSAMAX) 70 MG tablet Take 70 mg by mouth once a week. Take with a full glass of water on an empty stomach.    [provider]  amLODipine (NORVASC) 5 MG tablet TAKE 1 TABLET DAILY. Patient taking differently: TAKE 5 mg TABLET DAILY. 12/25/16   Jettie Booze, MD  aspirin 81 MG chewable tablet Chew 1 tablet (81 mg total) by mouth daily. 12/20/14   Almyra Deforest, PA  atorvastatin (LIPITOR) 10 MG tablet TAKE 1 TABLET ONCE DAILY. 03/26/17   Dunn, Nedra Hai, PA-C  Calcium Glycerophosphate  (PRELIEF PO) Take 1 tablet by mouth 3 (three) times daily as needed.     [provider]  Cholecalciferol (VITAMIN D PO) Take 1 tablet by mouth daily.    [provider]  Cinnamon 500 MG TABS Take 1 each by mouth daily.    [provider]  clopidogrel (PLAVIX) 75 MG tablet TAKE 1 TABLET ONCE DAILY. Patient taking differently: TAKE 75 mg TABLET ONCE DAILY. 12/25/16   Jettie Booze, MD  Coenzyme Q10 10 MG capsule Take 10 mg by mouth at bedtime.     [provider]  fluocinonide cream (LIDEX) 1.61 % Apply 1 application topically 2 (two) times daily as needed (rash).     [provider]  fluticasone (FLONASE) 50 MCG/ACT nasal spray Place 2 sprays into both nostrils daily as needed for allergies.     [provider]  furosemide (LASIX)  20 MG tablet Take 40 mg by mouth daily.    [provider]  isosorbide mononitrate (IMDUR) 60 MG 24 hr tablet TAKE (2) TABLETS BY MOUTH DAILY. 03/26/17   Jettie Booze, MD  levocetirizine (XYZAL) 5 MG tablet Take 5 mg by mouth every evening.    [provider]  levothyroxine (SYNTHROID, LEVOTHROID) 75 MCG tablet Take 75 mcg by mouth daily before breakfast.     [provider]  Melatonin 1 MG TABS Take 0.5 mg by mouth at bedtime. Reported on 03/22/2015    [provider]  metoprolol succinate (TOPROL-XL) 100 MG 24 hr tablet Take 1 tablet (100 mg total) by mouth daily. Please call and schedule August 2018 follow up. [phone] Patient taking differently: Take 100 mg by mouth at bedtime. Please call and schedule August 2018 follow up. [phone] 12/25/16   Lyda Jester M, PA-C  mirabegron ER (MYRBETRIQ) 50 MG TB24 tablet Take 50 mg by mouth at bedtime.     [provider]  nitroGLYCERIN (NITROSTAT) 0.4 MG SL tablet Place 0.4 mg under the tongue every 5 (five) minutes as needed for chest pain. X 3 doses    [provider]  NONFORMULARY OR COMPOUNDED  [place]:  [place] Tendonitis Cream - Diclofenac 3%, Baclofen 2%, Bupivacaine 1%, GAbapentin 6%, Ibuprofen 3%, Pentoxifylline 3%, apply 1-2 grams to affected area 3-4 times daily. Patient not taking: Reported on 01/14/2017 02/28/16   Edrick Kins, DPM  predniSONE (DELTASONE) 10 MG tablet Take 7.5 mg by mouth daily with breakfast.     [provider]  Psyllium 400 MG CAPS Take 2,800 mg by mouth daily.     [provider]  ranolazine (RANEXA) 1000 MG SR tablet Take 1 tablet (1,000 mg total) by mouth 2 (two) times daily. 10/07/16   Dunn, Nedra Hai, PA-C  venlafaxine XR (EFFEXOR-XR) 75 MG 24 hr capsule Take 225 mg by mouth daily with breakfast. Patient takes 3 capsules    [provider]    Family History Family History  Problem Relation Age of Onset  . Heart disease Mother   . Heart attack Mother   . Heart disease Father   . Heart attack Father   . Diabetes Brother   . Stroke Paternal Grandmother   . Hypertension Neg Hx     Social History Social History   Tobacco Use  . Smoking status: Former Smoker    Packs/day: 0.50    Years: 7.00    Pack years: 3.50    Types: Cigarettes    Last attempt to quit: 08/21/1969    Years since quitting: 47.6  . Smokeless tobacco: Never Used  Substance Use Topics  . Alcohol use: Yes    Alcohol/week: 0.0 oz    Comment: rare  . Drug use: No     Allergies   Nsaids; Stadol [butorphanol]; Sulfa antibiotics; Bisoprolol fumarate; Butorphanol tartrate; Codeine; Demerol [meperidine]; Imipramine; Statins; Tequin [gatifloxacin]; Brilinta [ticagrelor]; Pseudoephedrine hcl; Septra [sulfamethoxazole-trimethoprim]; and Sulfamethoxazole-trimethoprim   Review of Systems Review of Systems  Constitutional: Negative for chills and fever.  HENT: Negative for ear pain and sore throat.   Eyes: Negative for pain and visual disturbance.  Respiratory: Negative for cough and shortness of breath.   Cardiovascular: Negative for  chest pain and palpitations.  Gastrointestinal: Negative for abdominal pain and vomiting.  Genitourinary: Negative for dysuria and hematuria.  Musculoskeletal: Negative for arthralgias and back pain.  Skin: Negative for color change and rash.  Neurological: Positive for  tremors. Negative for seizures, syncope and headaches.  All other systems reviewed and are negative.    Physical Exam Updated Vital Signs BP (!) 111/46 (BP Location: Right Arm)   Pulse 62   Temp 98.3 F (36.8 C) (Oral)   Resp 18   SpO2 98%   Physical Exam  Constitutional: She appears well-developed and well-nourished. No distress.  HENT:  Head: Normocephalic and atraumatic.  Eyes: Conjunctivae are normal.  Neck: Neck supple.  Cardiovascular: Normal rate and regular rhythm.  No murmur heard. Pulmonary/Chest: Effort normal and breath sounds normal. No respiratory distress.  Abdominal: Soft. There is no tenderness.  Musculoskeletal: She exhibits no edema.       Lumbar back: She exhibits tenderness (left greater than right paralumbar tenderness) and pain. She exhibits no swelling.  Neurological: She is alert.  Skin: Skin is warm and dry. Ecchymosis (arms) noted.  Psychiatric: She has a normal mood and affect.  Nursing note and vitals reviewed. On exam there is paralumbar tenderness left greater than right, other than that she is got full range of motion of all of her extremities with no gross deformity.  She does have extensive amounts of old bruising on her forearms.   ED Treatments / Results  Labs (all labs ordered are listed, but only abnormal results are displayed) Labs Reviewed  BASIC METABOLIC PANEL  CBC WITH DIFFERENTIAL/PLATELET  URINALYSIS, ROUTINE W REFLEX MICROSCOPIC    EKG  EKG Interpretation None       Radiology Dg Chest 2 View  Result Date: 04/04/2017 CLINICAL DATA:  Fall with back pain EXAM: CHEST  2 VIEW COMPARISON:  Chest radiograph 05/15/2016 FINDINGS: Sequelae of CABG. Unchanged  cardiomegaly. Both lungs are clear. The visualized skeletal structures are unremarkable. IMPRESSION: No active cardiopulmonary disease. Electronically Signed   By: Ulyses Jarred M.D.   On: 04/04/2017 19:42   Dg Lumbar Spine Complete  Result Date: 04/04/2017 CLINICAL DATA:  Status post fall. EXAM: LUMBAR SPINE - COMPLETE 4+ VIEW COMPARISON:  None. FINDINGS: There is no evidence of lumbar spine fracture. Alignment is normal. Degenerative joint changes with narrowed joint space and osteophyte formation are identified. IMPRESSION: No acute fracture or dislocation. Electronically Signed   By: Abelardo Diesel M.D.   On: 04/04/2017 19:43   Ct Head Wo Contrast  Result Date: 04/04/2017 CLINICAL DATA:  [place]. EXAM: CT HEAD WITHOUT CONTRAST CT CERVICAL SPINE WITHOUT CONTRAST TECHNIQUE: Multidetector CT imaging of the head and cervical spine was performed following the standard protocol without intravenous contrast. Multiplanar CT image reconstructions of the cervical spine were also generated. COMPARISON:  01/14/2017 FINDINGS: CT HEAD FINDINGS Brain: Stable age related cerebral atrophy, ventriculomegaly and periventricular white matter disease. No extra-axial fluid collections are identified. No CT findings for acute hemispheric infarction or intracranial hemorrhage. No mass lesions. The brainstem and cerebellum are normal. Vascular: No hyperdense vessel or unexpected calcification. Skull: Normal. Negative for fracture or focal lesion. Sinuses/Orbits: The paranasal sinuses and mastoid air cells are clear. The globes are intact. Other: No scalp lesion or hematoma. CT CERVICAL SPINE FINDINGS Alignment: Degenerative cervical spondylosis with multilevel disc disease and facet disease. The overall alignment is maintained. The facets are normally aligned. Moderate multilevel facet disease. Skull base and vertebrae: No acute fracture. Soft tissues and spinal canal: No prevertebral fluid or swelling. No visible canal hematoma.  Disc levels: No obvious large disc protrusions, spinal or foraminal stenosis. Upper chest: Advanced atherosclerotic calcifications involving the aorta and branch vessels. No upper mediastinal mass. The lung  apices are grossly clear. Other: Severe atherosclerotic calcifications involving the carotid arteries. Vertebral artery calcifications are also noted. No neck mass or adenopathy. IMPRESSION: 1. Stable age related cerebral atrophy, ventriculomegaly and periventricular white matter disease. 2. No acute intracranial findings or skull fracture. 3. Degenerative cervical spondylosis with multilevel disc disease and facet disease in the cervical spine but no acute cervical spine fracture. 4. Advanced atherosclerotic calcifications involving the thoracic aorta and branch vessels including the carotid arteries and vertebral arteries. Electronically Signed   By: Marijo Sanes M.D.   On: 04/04/2017 20:27   Ct Cervical Spine Wo Contrast  Result Date: 04/04/2017 CLINICAL DATA:  [place]. EXAM: CT HEAD WITHOUT CONTRAST CT CERVICAL SPINE WITHOUT CONTRAST TECHNIQUE: Multidetector CT imaging of the head and cervical spine was performed following the standard protocol without intravenous contrast. Multiplanar CT image reconstructions of the cervical spine were also generated. COMPARISON:  01/14/2017 FINDINGS: CT HEAD FINDINGS Brain: Stable age related cerebral atrophy, ventriculomegaly and periventricular white matter disease. No extra-axial fluid collections are identified. No CT findings for acute hemispheric infarction or intracranial hemorrhage. No mass lesions. The brainstem and cerebellum are normal. Vascular: No hyperdense vessel or unexpected calcification. Skull: Normal. Negative for fracture or focal lesion. Sinuses/Orbits: The paranasal sinuses and mastoid air cells are clear. The globes are intact. Other: No scalp lesion or hematoma. CT CERVICAL SPINE FINDINGS Alignment: Degenerative cervical spondylosis with  multilevel disc disease and facet disease. The overall alignment is maintained. The facets are normally aligned. Moderate multilevel facet disease. Skull base and vertebrae: No acute fracture. Soft tissues and spinal canal: No prevertebral fluid or swelling. No visible canal hematoma. Disc levels: No obvious large disc protrusions, spinal or foraminal stenosis. Upper chest: Advanced atherosclerotic calcifications involving the aorta and branch vessels. No upper mediastinal mass. The lung apices are grossly clear. Other: Severe atherosclerotic calcifications involving the carotid arteries. Vertebral artery calcifications are also noted. No neck mass or adenopathy. IMPRESSION: 1. Stable age related cerebral atrophy, ventriculomegaly and periventricular white matter disease. 2. No acute intracranial findings or skull fracture. 3. Degenerative cervical spondylosis with multilevel disc disease and facet disease in the cervical spine but no acute cervical spine fracture. 4. Advanced atherosclerotic calcifications involving the thoracic aorta and branch vessels including the carotid arteries and vertebral arteries. Electronically Signed   By: Marijo Sanes M.D.   On: 04/04/2017 20:27    Procedures Procedures (including critical care time)  Medications Ordered in ED Medications - No data to display   Initial Impression / Assessment and Plan / ED Course  I have reviewed the triage vital signs and the nursing notes.  Pertinent labs & imaging results that were available during my care of the patient were reviewed by me and considered in my medical decision making (see chart for details).  Clinical Course as of Apr 04 2152  Nancy Fetter Apr 04, 2017  2046 Patient's imaging of her head and C-spine are all unremarkable.  Her labs are at baseline.  She states she is having no pain at rest but spasms when she tries to move.  Can see if she is able to return home or not.  [MB]  2052 Patient ambulated without difficulty here  in the ED.  She would like to go home and is calling her husband.  I think this is reasonable with her negative imaging and stable labs.  [MB]    Clinical Course User Index [MB] Hayden Rasmussen, MD    77 year old female status post mechanical  fall on Plavix.  Differential diagnosis including intracerebral hemorrhage, C-spine fracture, lumbar spine fracture.  Her imaging and lab work unremarkable and she is ambulated safely here and wants to return home.  Final Clinical Impressions(s) / ED Diagnoses   Final diagnoses:  Acute bilateral low back pain without sciatica  Fall, initial encounter    ED Discharge Orders    None       Hayden Rasmussen, MD 04/04/17 2155

## 2017-04-11 ENCOUNTER — Other Ambulatory Visit: Payer: Self-pay | Admitting: Family Medicine

## 2017-04-11 DIAGNOSIS — M5416 Radiculopathy, lumbar region: Secondary | ICD-10-CM

## 2017-04-13 ENCOUNTER — Ambulatory Visit: Payer: Medicare Other | Admitting: Neurology

## 2017-04-13 ENCOUNTER — Telehealth: Payer: Self-pay | Admitting: *Deleted

## 2017-04-13 NOTE — Telephone Encounter (Signed)
No showed follow up appointment. 

## 2017-04-14 ENCOUNTER — Encounter: Payer: Self-pay | Admitting: Neurology

## 2017-04-16 ENCOUNTER — Ambulatory Visit
Admission: RE | Admit: 2017-04-16 | Discharge: 2017-04-16 | Disposition: A | Discharge status: Home / Self Care | Payer: Medicare Other | Source: Ambulatory Visit | Attending: Family Medicine | Admitting: Family Medicine

## 2017-04-16 DIAGNOSIS — M5416 Radiculopathy, lumbar region: Secondary | ICD-10-CM

## 2017-04-28 ENCOUNTER — Encounter: Payer: Self-pay | Admitting: Interventional Cardiology

## 2017-05-05 ENCOUNTER — Encounter: Payer: Self-pay | Admitting: Interventional Cardiology

## 2017-05-05 ENCOUNTER — Ambulatory Visit (INDEPENDENT_AMBULATORY_CARE_PROVIDER_SITE_OTHER): Payer: Medicare Other | Admitting: Interventional Cardiology

## 2017-05-05 VITALS — BP 116/60 | HR 66 | Ht <= 58 in | Wt 209.8 lb

## 2017-05-05 DIAGNOSIS — R0609 Other forms of dyspnea: Secondary | ICD-10-CM | POA: Diagnosis not present

## 2017-05-05 DIAGNOSIS — I10 Essential (primary) hypertension: Secondary | ICD-10-CM | POA: Diagnosis not present

## 2017-05-05 DIAGNOSIS — I2510 Atherosclerotic heart disease of native coronary artery without angina pectoris: Secondary | ICD-10-CM

## 2017-05-05 DIAGNOSIS — I5032 Chronic diastolic (congestive) heart failure: Secondary | ICD-10-CM

## 2017-05-05 NOTE — Patient Instructions (Signed)
Medication Instructions:  Your physician recommends that you continue on your current medications as directed. Please refer to the Current Medication list given to you today.   Labwork: None ordered  Testing/Procedures: None ordered  Follow-Up: Your physician wants you to follow-up in: 6 months with APP on Dr. Hassell Done team. Dennis Bast will receive a reminder letter in the mail two months in advance. If you don't receive a letter, please call our office to schedule the follow-up appointment.   Your physician wants you to follow-up in: 1 year with Dr. Irish Lack. You will receive a reminder letter in the mail two months in advance. If you don't receive a letter, please call our office to schedule the follow-up appointment.   Any Other Special Instructions Will Be Listed Below (If Applicable).     If you need a refill on your cardiac medications before your next appointment, please call your pharmacy.

## 2017-05-05 NOTE — Progress Notes (Signed)
Cardiology Office Note   Date:  05/05/2017   ID:  Erin, Ingram 01-25-1941, MRN [id number]  PCP:  Erin Fess, MD    No chief complaint on file. CAD   Wt Readings from Last 3 Encounters:  05/05/17 209 lb 12.8 oz (95.2 kg)  11/13/16 209 lb (94.8 kg)  07/27/16 200 lb (90.7 kg)       History of Present Illness: Erin Ingram is a 77 y.o. female  with a history of CAD (CABG 2000, DES x2 to [place] in 2016), Low risk NST 03/2016, hypertension, hyperlipidemia, OSA on CPAP, chronic diastolic CHF, morbid obesity, polymyalgia rheumatica treated with prednisone, CKD III, left subclavian stenosis and prior h/oTIA.  She had repeat cardiac catheterization in 2008 that demonstrated patent grafts. She's had a history of persistent angina treated with EECP many years ago. She's been treated with Ranexa, metoprolol, amlodipine, isosorbide.   Admitted 9/28-9/29/16 with worsening angina. LHC demonstrated 90% distal SVG-RCA stenosis. This was treated with 2 overlapping Synergy DES. SVG-OM2 was patent. There is a Y graft with a LIMA-LAD and SVG-diagonal. LIMA portion was patent but SVG-diagonal was occluded.  Readmitted 10/10-10/11/16 with back and right arm pain similar to previous angina. She had resolution with nitroglycerin. Cardiac enzymes remained normal. Relook cardiac catheterization demonstrated patent stents in the vein graft to the RCA and otherwise stable anatomy. Continued medical therapy was recommended.   Since last visit, she continues to have DOE.  Denies : Chest pain. Dizziness. Leg edema. Nitroglycerin use. Orthopnea. Palpitations. Paroxysmal nocturnal dyspnea. Syncope.   She bruises easily.  No blood in stool.  SHe reports some forgetfulness and hallucinations that are prompting a visit to neuro.  She has a shot in L4 where she will have to hold Plavix.   She may need eyelid surgery.    Past Medical History:  Diagnosis Date  . (HFpEF) heart failure with  preserved ejection fraction (HCC)    a. normal EF, LVEDP at [facility] in 6/17 33 >> Lasix started   . Anemia   . Arthritis    "fingers, back, shoulders, hips" (04/10/2016)  . Chronic diastolic CHF (congestive heart failure) ([place])   . CKD (chronic kidney disease), stage III ([place])   . Coronary artery disease    a. s/p CABG 2000 (SVG/ free LIMA Y graft to the diagonal and distal LAD, SVG to the OM 1, and SVG to the PDA) . b. Cath 12/19/2014 90% dSVG to RCA s/p 2 overlapping DES. c. LHC 2016, 2017 no intervention except diuresis needed. d. Low risk nuc 03/2016.  . Depression    with anxious component  . GERD (gastroesophageal reflux disease)   . History of hiatal hernia   . Hyperlipidemia   . Hypothyroidism   . Obesity   . OSA on CPAP   . PMR (polymyalgia rheumatica) (HCC)   . Pneumonia    "2-3 times" (04/10/2016)  . Stable angina (HCC)    microvascular, improved with Ranexa  . Subclavian artery stenosis ([place])   . TIA (transient ischemic attack)     Past Surgical History:  Procedure Laterality Date  . BREAST BIOPSY Right 1964  . CARDIAC CATHETERIZATION  08   patent grafts, no culprit lesions, EF 65%  . CARDIAC CATHETERIZATION N/A 12/19/2014   Procedure: Left Heart Cath and Cors/Grafts Angiography;  Surgeon: Erin Booze, MD;  Location: [place] [facility];  Service: Cardiovascular;  Laterality: N/A;  . CARDIAC CATHETERIZATION N/A 12/19/2014   Procedure: Coronary  Stent Intervention;  Surgeon: Erin Booze, MD;  Location: [place] [facility];  Service: Cardiovascular;  Laterality: N/A;  . CARDIAC CATHETERIZATION N/A 01/01/2015   Procedure: Left Heart Cath and Cors/Grafts Angiography;  Surgeon: Erin Booze, MD;  Location: [place] [facility];  Service: Cardiovascular;  Laterality: N/A;  . CARDIAC CATHETERIZATION N/A 09/20/2015   Procedure: Left Heart Cath and Cors/Grafts Angiography;  Surgeon: Erin Booze, MD;  Location: [place] [facility];  Service: Cardiovascular;   Laterality: N/A;  . CATARACT EXTRACTION W/ INTRAOCULAR LENS  IMPLANT, BILATERAL Bilateral 2011  . COLONOSCOPY WITH PROPOFOL N/A 07/27/2016   Procedure: COLONOSCOPY WITH PROPOFOL;  Surgeon: Erin Corner, MD;  Location: [place];  Service: Endoscopy;  Laterality: N/A;  . CORONARY ARTERY BYPASS GRAFT  2000   ASCVD, multivessel, S./P.  . DILATION AND CURETTAGE OF UTERUS  1980s  . ESOPHAGOGASTRODUODENOSCOPY (EGD) WITH PROPOFOL N/A 07/27/2016   Procedure: ESOPHAGOGASTRODUODENOSCOPY (EGD) WITH PROPOFOL;  Surgeon: Erin Corner, MD;  Location: [place];  Service: Endoscopy;  Laterality: N/A;  . FRACTURE SURGERY    . PATELLA FRACTURE SURGERY Left 1993     Current Outpatient Medications  Medication Sig Dispense Refill  . alendronate (FOSAMAX) 70 MG tablet Take 70 mg by mouth once a week. Take with a full glass of water on an empty stomach.    Marland Kitchen amLODipine (NORVASC) 5 MG tablet TAKE 1 TABLET DAILY. (Patient taking differently: TAKE 5 mg TABLET DAILY.) 90 tablet 2  . aspirin 81 MG chewable tablet Chew 1 tablet (81 mg total) by mouth daily.    Marland Kitchen atorvastatin (LIPITOR) 10 MG tablet TAKE 1 TABLET ONCE DAILY. 90 tablet 2  . Calcium Glycerophosphate (PRELIEF PO) Take 1 tablet by mouth 3 (three) times daily as needed.     . Cholecalciferol (VITAMIN D PO) Take 1 tablet by mouth daily.    . Cinnamon 500 MG TABS Take 1 each by mouth daily.    . clopidogrel (PLAVIX) 75 MG tablet TAKE 1 TABLET ONCE DAILY. (Patient taking differently: TAKE 75 mg TABLET ONCE DAILY.) 90 tablet 2  . Coenzyme Q10 10 MG capsule Take 10 mg by mouth at bedtime.     . fluocinonide cream (LIDEX) 4.09 % Apply 1 application topically 2 (two) times daily as needed (rash).     . fluticasone (FLONASE) 50 MCG/ACT nasal spray Place 2 sprays into both nostrils daily as needed for allergies.     . furosemide (LASIX) 20 MG tablet Take 40 mg by mouth daily.    . isosorbide mononitrate (IMDUR) 60 MG 24 hr tablet TAKE (2) TABLETS BY  MOUTH DAILY. 180 tablet 2  . levocetirizine (XYZAL) 5 MG tablet Take 5 mg by mouth every evening.    Marland Kitchen levothyroxine (SYNTHROID, LEVOTHROID) 75 MCG tablet Take 75 mcg by mouth daily before breakfast.     . Melatonin 1 MG TABS Take 0.5 mg by mouth at bedtime. Reported on 03/22/2015    . metoprolol succinate (TOPROL-XL) 100 MG 24 hr tablet Take 1 tablet (100 mg total) by mouth daily. Please call and schedule August 2018 follow up. [phone] (Patient taking differently: Take 100 mg by mouth at bedtime. Please call and schedule August 2018 follow up. [phone]) 90 tablet 3  . mirabegron ER (MYRBETRIQ) 50 MG TB24 tablet Take 50 mg by mouth at bedtime.     . nitroGLYCERIN (NITROSTAT) 0.4 MG SL tablet Place 0.4 mg under the tongue every 5 (five) minutes as needed for chest pain. X  3 doses    . NONFORMULARY OR COMPOUNDED ITEM [facility]:  [place] Tendonitis Cream - Diclofenac 3%, Baclofen 2%, Bupivacaine 1%, GAbapentin 6%, Ibuprofen 3%, Pentoxifylline 3%, apply 1-2 grams to affected area 3-4 times daily. 120 each 2  . predniSONE (DELTASONE) 10 MG tablet Take 7.5 mg by mouth daily with breakfast.     . Psyllium 400 MG CAPS Take 2,800 mg by mouth daily.     . ranolazine (RANEXA) 1000 MG SR tablet Take 1 tablet (1,000 mg total) by mouth 2 (two) times daily. 180 tablet 1  . venlafaxine XR (EFFEXOR-XR) 75 MG 24 hr capsule Take 225 mg by mouth daily with breakfast. Patient takes 3 capsules     Current Facility-Administered Medications  Medication Dose Route Frequency Provider Last Rate Last Dose  . betamethasone acetate-betamethasone sodium phosphate (CELESTONE) injection 3 mg  3 mg Intramuscular Once Edrick Kins, DPM        Allergies:   Nsaids; Butorphanol; Sulfa antibiotics; Bisoprolol fumarate; Butorphanol tartrate; Codeine; Demerol [meperidine]; Imipramine; Statins; Tequin [gatifloxacin]; Brilinta [ticagrelor]; Pseudoephedrine hcl; Septra [sulfamethoxazole-trimethoprim]; and  Sulfamethoxazole-trimethoprim    Social History:  The patient  reports that she quit smoking about 47 years ago. Her smoking use included cigarettes. She has a 3.50 pack-year smoking history. she has never used smokeless tobacco. She reports that she drinks alcohol. She reports that she does not use drugs.   Family History:  The patient's family history includes Diabetes in her brother; Heart attack in her father and mother; Heart disease in her father and mother; Stroke in her paternal grandmother.    ROS:  Please see the history of present illness.   Otherwise, review of systems are positive for occasional ankle edema.   All other systems are reviewed and negative.    PHYSICAL EXAM: VS:  BP 116/60   Pulse 66   Ht 4\' 10"  (1.473 m)   Wt 209 lb 12.8 oz (95.2 kg)   SpO2 97%   BMI 43.85 kg/m  , BMI Body mass index is 43.85 kg/m. GEN: Well nourished, well developed, in no acute distress  HEENT: normal  Neck: no JVD, carotid bruits, or masses Cardiac: RRR; no murmurs, rubs, or gallops,no edema  Respiratory:  clear to auscultation bilaterally, normal work of breathing GI: soft, nontender, nondistended, + BS, obese MS: no deformity or atrophy  Skin: warm and dry, no rash Neuro:  Strength and sensation are intact Psych: euthymic mood, full affect   EKG:   The ekg ordered today demonstrates NSR, low voltage, no ST changes   Recent Labs: 08/26/2016: ALT 11 11/13/2016: NT-Pro BNP 537 04/04/2017: BUN 21; Creatinine, Ser 1.37; Hemoglobin 10.7; Platelets 247; Potassium 4.4; Sodium 139   Lipid Panel    Component Value Date/Time   CHOL 163 09/18/2015 0400   TRIG 155 (H) 09/18/2015 0400   HDL 66 09/18/2015 0400   CHOLHDL 2.5 09/18/2015 0400   VLDL 31 09/18/2015 0400   LDLCALC 66 09/18/2015 0400     Other studies Reviewed: Additional studies/ records that were reviewed today with results demonstrating: LDL 70 in July 2018.   ASSESSMENT AND PLAN:  1. CAD: s/p CABG and DES to SVG to  RCA in 2016. No clear angina.  I think the DOE is related to deconditioning.  She avoids quick changes in position.  2. PAD: Mild carotid disease bilaterally in 2017. 3. HTN: BP controled. Continue curent meds. 4. Obesity: Walking and diet control to lose weight.  5. DOE: Chronic diastolic  heart failure. Appears euvolemic. 6. Preoperative eval: OK for eyelid surgery.  No further cardiac testing done.     Current medicines are reviewed at length with the patient today.  The patient concerns regarding her medicines were addressed.  The following changes have been made:  No change  Labs/ tests ordered today include:  No orders of the defined types were placed in this encounter.   Recommend 150 minutes/week of aerobic exercise Low fat, low carb, high fiber diet recommended  Disposition:   FU in 1 year   Signed, Larae Grooms, MD  05/05/2017 2:42 PM    [facility] [address], [place], [place]  [address] Phone: [phone]; Fax: [phone]

## 2017-05-25 ENCOUNTER — Encounter: Payer: Self-pay | Admitting: *Deleted

## 2017-06-03 ENCOUNTER — Ambulatory Visit: Payer: Medicare Other | Admitting: Nurse Practitioner

## 2017-06-07 ENCOUNTER — Encounter: Payer: Self-pay | Admitting: *Deleted

## 2017-06-10 ENCOUNTER — Encounter: Payer: Self-pay | Admitting: Nurse Practitioner

## 2017-06-10 ENCOUNTER — Ambulatory Visit: Payer: Medicare Other | Admitting: Nurse Practitioner

## 2017-06-10 DIAGNOSIS — K219 Gastro-esophageal reflux disease without esophagitis: Secondary | ICD-10-CM

## 2017-06-10 DIAGNOSIS — G259 Extrapyramidal and movement disorder, unspecified: Secondary | ICD-10-CM | POA: Diagnosis not present

## 2017-06-10 DIAGNOSIS — G4733 Obstructive sleep apnea (adult) (pediatric): Secondary | ICD-10-CM

## 2017-06-10 DIAGNOSIS — I10 Essential (primary) hypertension: Secondary | ICD-10-CM | POA: Diagnosis not present

## 2017-06-10 DIAGNOSIS — R600 Localized edema: Secondary | ICD-10-CM | POA: Diagnosis not present

## 2017-06-10 DIAGNOSIS — E039 Hypothyroidism, unspecified: Secondary | ICD-10-CM | POA: Insufficient documentation

## 2017-06-10 DIAGNOSIS — D649 Anemia, unspecified: Secondary | ICD-10-CM | POA: Diagnosis not present

## 2017-06-10 DIAGNOSIS — F339 Major depressive disorder, recurrent, unspecified: Secondary | ICD-10-CM

## 2017-06-10 DIAGNOSIS — R259 Unspecified abnormal involuntary movements: Secondary | ICD-10-CM | POA: Insufficient documentation

## 2017-06-10 DIAGNOSIS — R441 Visual hallucinations: Secondary | ICD-10-CM | POA: Diagnosis not present

## 2017-06-10 DIAGNOSIS — I2510 Atherosclerotic heart disease of native coronary artery without angina pectoris: Secondary | ICD-10-CM | POA: Diagnosis not present

## 2017-06-10 DIAGNOSIS — N318 Other neuromuscular dysfunction of bladder: Secondary | ICD-10-CM | POA: Diagnosis not present

## 2017-06-10 NOTE — Assessment & Plan Note (Signed)
Mood seems stable, continue Effexor 225mg  po daily.

## 2017-06-10 NOTE — Assessment & Plan Note (Signed)
Twitching movements, f/u Neurology.

## 2017-06-10 NOTE — Assessment & Plan Note (Signed)
Occasional indigestion, continue Ranitidine.

## 2017-06-10 NOTE — Assessment & Plan Note (Signed)
Continue Levothyroxine 59mcg daily, update TSH

## 2017-06-10 NOTE — Assessment & Plan Note (Signed)
Average nocturnal urination x2, continue Myrbetriq, sometime urinary leakage

## 2017-06-10 NOTE — Assessment & Plan Note (Signed)
CPAP.  

## 2017-06-10 NOTE — Assessment & Plan Note (Signed)
In setting of hx of depression, may be developed psychotic features, the patient stated it getting better, Neurology consultation 06/15/17, update CBC CMP TSH Vit B12 lipid panel 06/18/27

## 2017-06-10 NOTE — Progress Notes (Signed)
Provider:  Marlana Latus NP Location:   [facility] [place] of Service:   [facility] [place]  PCP: Hulan Fess, MD Patient Care Team: Hulan Fess, MD as PCP - General (Family Medicine) Jettie Booze, MD as PCP - Cardiology (Cardiology) Wilford Corner, MD as Consulting Physician (Gastroenterology) Marlou Sa, Tonna Corner, MD as Consulting Physician (Orthopedic Surgery) Marcial Pacas, MD as Consulting Physician (Neurology) Franchot Gallo, MD as Consulting Physician (Urology) Kary Kos, MD as Consulting Physician (Neurosurgery)  Extended Emergency Contact Information Primary Emergency Contact: Merlinda Frederick Address: [address]          [place] [address] [place] Phone: [phone] Mobile Phone: [phone] Relation: Spouse Secondary Emergency Contact: Dickens Mobile Phone: [phone] Relation: Son  Code Status: DNR Goals of Care: Advanced Directive information Advanced Directives 04/04/2017  Does Patient Have a Medical Advance Directive? No;Yes  Type of Advance Directive [place]  Does patient want to make changes to medical advance directive? -  Copy of [place] in Chart? No - copy requested  Would patient like information on creating a medical advance directive? Yes (ED - Information included in AVS)      Chief Complaint  Patient presents with  . Establish Care    HPI: Patient is a 77 y.o. female seen today for admission to [facility] service.  The patient stated she has experienced visual hallucination in the past 2 months, seeing woman dressed in pink with bid black polka dots, these people don't  look at me or say anything, there ar just there for a few seconds, desk lump turned over to side, has become a fun, some one is there, lamp on someone's head, older Ioan Landini dressed as pharmacist, comic book character on wall, woman dressed in [employer] uniform in brown instead of green, 2  bottles on desk, one with red top became a little rooster and a samall black grey while bird.  its getting better last month. Also some on and off twitching in fingers, hands, arms, legs, mouth, eyes usually when lying down before sleep. She has history of HTN, hypothyroidism, GERD among others  Past Medical History:  Diagnosis Date  . (HFpEF) heart failure with preserved ejection fraction (HCC)    a. normal EF, LVEDP at [facility] in 6/17 33 >> Lasix started   . Anemia   . Arthritis    "fingers, back, shoulders, hips" (04/10/2016)  . Chronic diastolic CHF (congestive heart failure) ([place])   . CKD (chronic kidney disease), stage III ([place])   . Coronary artery disease    a. s/p CABG 2000 (SVG/ free LIMA Y graft to the diagonal and distal LAD, SVG to the OM 1, and SVG to the PDA) . b. Cath 12/19/2014 90% dSVG to RCA s/p 2 overlapping DES. c. LHC 2016, 2017 no intervention except diuresis needed. d. Low risk nuc 03/2016.  . Depression    with anxious component  . GERD (gastroesophageal reflux disease)   . History of hiatal hernia   . Hyperlipidemia   . Hypothyroidism   . Obesity   . OSA on CPAP   . PMR (polymyalgia rheumatica) (HCC)   . Pneumonia    "2-3 times" (04/10/2016)  . Stable angina (HCC)    microvascular, improved with Ranexa  . Subclavian artery stenosis ([place])   . TIA (transient ischemic attack)    Past Surgical History:  Procedure Laterality Date  . BREAST BIOPSY Right 1964  . CARDIAC  CATHETERIZATION  08   patent grafts, no culprit lesions, EF 65%  . CARDIAC CATHETERIZATION N/A 12/19/2014   Procedure: Left Heart Cath and Cors/Grafts Angiography;  Surgeon: Jettie Booze, MD;  Location: [place] CV LAB;  Service: Cardiovascular;  Laterality: N/A;  . CARDIAC CATHETERIZATION N/A 12/19/2014   Procedure: Coronary Stent Intervention;  Surgeon: Jettie Booze, MD;  Location: [place] CV LAB;  Service: Cardiovascular;  Laterality: N/A;  . CARDIAC CATHETERIZATION N/A 01/01/2015     Procedure: Left Heart Cath and Cors/Grafts Angiography;  Surgeon: Jettie Booze, MD;  Location: [place] CV LAB;  Service: Cardiovascular;  Laterality: N/A;  . CARDIAC CATHETERIZATION N/A 09/20/2015   Procedure: Left Heart Cath and Cors/Grafts Angiography;  Surgeon: Jettie Booze, MD;  Location: [place] [facility] LAB;  Service: Cardiovascular;  Laterality: N/A;  . CATARACT EXTRACTION W/ INTRAOCULAR LENS  IMPLANT, BILATERAL Bilateral 2011  . COLONOSCOPY WITH PROPOFOL N/A 07/27/2016   Procedure: COLONOSCOPY WITH PROPOFOL;  Surgeon: Wilford Corner, MD;  Location: [place];  Service: Endoscopy;  Laterality: N/A;  . CORONARY ARTERY BYPASS GRAFT  2000   ASCVD, multivessel, S./P.  . DILATION AND CURETTAGE OF UTERUS  1980s  . ESOPHAGOGASTRODUODENOSCOPY (EGD) WITH PROPOFOL N/A 07/27/2016   Procedure: ESOPHAGOGASTRODUODENOSCOPY (EGD) WITH PROPOFOL;  Surgeon: Wilford Corner, MD;  Location: [place];  Service: Endoscopy;  Laterality: N/A;  . FRACTURE SURGERY    . PATELLA FRACTURE SURGERY Left 1993    reports that she quit smoking about 47 years ago. Her smoking use included cigarettes. She has a 3.50 pack-year smoking history. She has never used smokeless tobacco. She reports that she does not drink alcohol or use drugs. Social History   Socioeconomic History  . Marital status: Married    Spouse name: Not on file  . Number of children: 2  . Years of education: College  . Highest education level: Not on file  Occupational History  . Occupation: Retired Cytogeneticist  Social Needs  . Financial resource strain: Not on file  . Food insecurity:    Worry: Not on file    Inability: Not on file  . Transportation needs:    Medical: Not on file    Non-medical: Not on file  Tobacco Use  . Smoking status: Former Smoker    Packs/day: 0.50    Years: 7.00    Pack years: 3.50    Types: Cigarettes    Last attempt to quit: 08/21/1969    Years since quitting: 47.8  . Smokeless  tobacco: Never Used  Substance and Sexual Activity  . Alcohol use: No    Alcohol/week: 0.0 oz    Frequency: Never    Comment: rare  . Drug use: No  . Sexual activity: Never  Lifestyle  . Physical activity:    Days per week: Not on file    Minutes per session: Not on file  . Stress: Not on file  Relationships  . Social connections:    Talks on phone: Not on file    Gets together: Not on file    Attends religious service: Not on file    Active member of club or organization: Not on file    Attends meetings of clubs or organizations: Not on file    Relationship status: Not on file  . Intimate partner violence:    Fear of current or ex partner: Not on file    Emotionally abused: Not on file    Physically abused: Not on file  Forced sexual activity: Not on file  Other Topics Concern  . Not on file  Social History Narrative   Social History     Social History Narrative       Lives in [place] with husband.   Diet: Regular   Do you drink/eat things with caffeine? 1 cup caffeine per day.  Not much coffee   Marital status: Married                           What year were you married? 1964   Do you live in a house, apartment, assisted living, condo, trailer, etc)? Here at [facility]   Is it one or more stories?   How many persons live in your home? 2   Do you have any pets in your home? No   Current or past profession: Engineer, production (RN)   Do you exercise? I did                                                Type & how often: Swimming, Tai Chi, exercise classes   Do you have a living will? yes   Do you have a DNR Form? Yes   Do you have a POA/HPOA forms?              Functional Status Survey:    Family History  Problem Relation Age of Onset  . Heart disease Mother   . Heart attack Mother   . Heart disease Father   . Heart attack Father   . Diabetes Brother   . Pulmonary embolism Brother   . Stroke Paternal Grandmother   . Hypertension Neg Hx     Health  Maintenance  Topic Date Due  . TETANUS/TDAP  09/05/1958  . PNA vac Low Risk Adult (1 of 2 - PCV13) 09/04/2004  . INFLUENZA VACCINE  10/21/2016  . DEXA SCAN  Completed    Allergies  Allergen Reactions  . Nsaids Other (See Comments)    Due to chronic kidney failure  . Butorphanol Other (See Comments)    agitation Constipation Produced a lot of urine, made patient feel crazy  . Sulfa Antibiotics Rash  . Bisoprolol Fumarate Other (See Comments)    Doesn't remember  . Butorphanol Tartrate Other (See Comments)    Produced a lot of urine, made patient feel crazy  . Codeine Nausea And Vomiting  . Demerol [Meperidine] Nausea And Vomiting  . Imipramine     Sweating, facial dysfunction   . Statins     MYALGIAS  . Tequin [Gatifloxacin] Other (See Comments)    Caused hypoglycemia Low blood sugar  . Brilinta [Ticagrelor] Rash    CAUSES PETECHIAE, PURPURA  . Pseudoephedrine Hcl Palpitations  . Septra [Sulfamethoxazole-Trimethoprim] Rash  . Sulfamethoxazole-Trimethoprim Rash    Allergies as of 06/10/2017      Reactions   Nsaids Other (See Comments)   Due to chronic kidney failure   Butorphanol Other (See Comments)   agitation Constipation Produced a lot of urine, made patient feel crazy   Sulfa Antibiotics Rash   Bisoprolol Fumarate Other (See Comments)   Doesn't remember   Butorphanol Tartrate Other (See Comments)   Produced a lot of urine, made patient feel crazy   Codeine Nausea And Vomiting   Demerol [meperidine] Nausea And Vomiting  Imipramine    Sweating, facial dysfunction    Statins    MYALGIAS   Tequin [gatifloxacin] Other (See Comments)   Caused hypoglycemia Low blood sugar   Brilinta [ticagrelor] Rash   CAUSES PETECHIAE, PURPURA   Pseudoephedrine Hcl Palpitations   Septra [sulfamethoxazole-trimethoprim] Rash   Sulfamethoxazole-trimethoprim Rash      Medication List        Accurate as of 06/10/17 11:59 PM. Always use your most recent med list.           alendronate 70 MG tablet Commonly known as:  FOSAMAX Take 70 mg by mouth once a week. Take with a full glass of water on an empty stomach.   amLODipine 5 MG tablet Commonly known as:  NORVASC TAKE 1 TABLET DAILY.   aspirin 81 MG chewable tablet Chew 1 tablet (81 mg total) by mouth daily.   atorvastatin 10 MG tablet Commonly known as:  LIPITOR TAKE 1 TABLET ONCE DAILY.   Cinnamon 500 MG Tabs Take 1 each by mouth daily.   clopidogrel 75 MG tablet Commonly known as:  PLAVIX TAKE 1 TABLET ONCE DAILY.   Coenzyme Q10 10 MG capsule Take 10 mg by mouth at bedtime.   fluticasone 50 MCG/ACT nasal spray Commonly known as:  FLONASE Place 2 sprays into both nostrils daily as needed for allergies.   furosemide 20 MG tablet Commonly known as:  LASIX Take 40 mg by mouth daily.   isosorbide mononitrate 60 MG 24 hr tablet Commonly known as:  IMDUR Take 60 mg by mouth daily.   levocetirizine 5 MG tablet Commonly known as:  XYZAL Take 5 mg by mouth every evening.   levothyroxine 75 MCG tablet Commonly known as:  SYNTHROID, LEVOTHROID Take 75 mcg by mouth daily before breakfast.   metoprolol succinate 100 MG 24 hr tablet Commonly known as:  TOPROL-XL Take 1 tablet (100 mg total) by mouth daily. Please call and schedule August 2018 follow up. [phone]   MYRBETRIQ 50 MG Tb24 tablet Generic drug:  mirabegron ER Take 50 mg by mouth at bedtime.   nitroGLYCERIN 0.4 MG SL tablet Commonly known as:  NITROSTAT Place 0.4 mg under the tongue every 5 (five) minutes as needed for chest pain. X 3 doses   NONFORMULARY OR COMPOUNDED ITEM [facility]:  [place] Tendonitis Cream - Diclofenac 3%, Baclofen 2%, Bupivacaine 1%, GAbapentin 6%, Ibuprofen 3%, Pentoxifylline 3%, apply 1-2 grams to affected area 3-4 times daily.   predniSONE 5 MG tablet Commonly known as:  DELTASONE Take 7.5 mg by mouth daily. Take 1.5 tablet= 7.'5mg'$  with food or milk by mouth once daily.   PRELIEF  PO Take 2 tablets by mouth as needed.   Psyllium 400 MG Caps Take 2,800 mg by mouth daily.   ranitidine 150 MG capsule Commonly known as:  ZANTAC Take 150 mg by mouth 2 (two) times daily.   ranolazine 1000 MG SR tablet Commonly known as:  RANEXA Take 1 tablet (1,000 mg total) by mouth 2 (two) times daily.   venlafaxine XR 75 MG 24 hr capsule Commonly known as:  EFFEXOR-XR Take 225 mg by mouth daily with breakfast. Patient takes 3 capsules   VITAMIN D PO Take 2,000 Units by mouth daily.       Review of Systems  Constitutional: Negative for activity change, appetite change, diaphoresis, fatigue and fever.  HENT: Negative for congestion, dental problem, hearing loss, rhinorrhea, sore throat, trouble swallowing and voice change.   Eyes: Negative for visual disturbance.  Respiratory: Negative for cough, chest tightness, shortness of breath and wheezing.   Cardiovascular: Negative for chest pain, palpitations and leg swelling.  Gastrointestinal: Negative for abdominal distention, abdominal pain, constipation, diarrhea, nausea and vomiting.  Genitourinary: Positive for frequency. Negative for difficulty urinating, dysuria and urgency.       Urinary frequency, 2x per night average, sometimes urinary leakage.   Musculoskeletal: Positive for gait problem. Negative for back pain.  Skin: Negative for color change and rash.  Neurological: Negative for dizziness, speech difficulty, weakness, numbness and headaches.       Twitching in fingers, hands, arms, legs. Feeling of pain and needles in fingers, toes, ankles, and arms.   Psychiatric/Behavioral: Positive for hallucinations and sleep disturbance. Negative for agitation, behavioral problems and confusion. The patient is not nervous/anxious.     Vitals:   06/10/17 1339  BP: 128/62  Pulse: 78  Resp: 20  Temp: (!) 97.4 F (36.3 C)  SpO2: 98%  Weight: 209 lb 12.8 oz (95.2 kg)  Height: '4\' 10"'$  (1.473 m)   Body mass index is 43.85  kg/m. Physical Exam  Constitutional: She is oriented to person, place, and time. She appears well-developed and well-nourished.  HENT:  Head: Normocephalic and atraumatic.  Mouth/Throat: Oropharynx is clear and moist.  Eyes: Pupils are equal, round, and reactive to light. Conjunctivae and EOM are normal. Right eye exhibits no discharge. Left eye exhibits no discharge.  Neck: Normal range of motion. Neck supple. No JVD present. No thyromegaly present.  Cardiovascular: Normal rate, regular rhythm and normal heart sounds.  No murmur heard. Pulmonary/Chest: Effort normal. She has no wheezes. She has no rales.  Abdominal: Soft. Bowel sounds are normal. She exhibits no distension. There is no tenderness. There is no rebound.  Genitourinary: No vaginal discharge found.  Musculoskeletal: Normal range of motion. She exhibits no edema or tenderness.  Ambulates with walker.   Neurological: She is alert and oriented to person, place, and time. She displays normal reflexes. No cranial nerve deficit. She exhibits normal muscle tone. Coordination normal.  Monofilament intact BLE and BUE   Skin: Skin is warm and dry. No rash noted. No erythema.  Mid sternal surgical scar, right lower leg surgical scar from CABG, left knee surgical scar from patellar repair.   Psychiatric: She has a normal mood and affect. Her behavior is normal. Judgment and thought content normal.    Labs reviewed: Basic Metabolic Panel: Recent Labs    08/26/16 1514 11/13/16 1211 04/04/17 1935  NA 141 140 139  K 3.8 4.2 4.4  CL  --  99 105  CO2 '26 23 24  '$ GLUCOSE 116 99 132*  BUN 24.3 24 21*  CREATININE 1.4* 1.26* 1.37*  CALCIUM 9.6 9.6 8.6*   Liver Function Tests: Recent Labs    08/26/16 1514 08/26/16 1514  AST 10  --   ALT 11  --   ALKPHOS 56  --   BILITOT 0.30  --   PROT 7.0 6.5  ALBUMIN 3.7  --    No results for input(s): LIPASE, AMYLASE in the last 8760 hours. No results for input(s): AMMONIA in the last 8760  hours. CBC: Recent Labs    08/26/16 1513 04/04/17 1935  WBC 17.9* 12.2*  NEUTROABS 15.3* 10.6*  HGB 11.3* 10.7*  HCT 34.9 33.4*  MCV 94.3 92.8  PLT 251 247   Cardiac Enzymes: No results for input(s): CKTOTAL, CKMB, CKMBINDEX, TROPONINI in the last 8760 hours. BNP: Invalid input(s): POCBNP Lab Results  Component  Value Date   HGBA1C 5.6 09/18/2015   Lab Results  Component Value Date   TSH 3.616 09/18/2015   No results found for: VITAMINB12 No results found for: FOLATE No results found for: IRON, TIBC, FERRITIN  Imaging and Procedures obtained prior to SNF admission: Mr Lumbar Spine Wo Contrast  Result Date: 04/16/2017 CLINICAL DATA:  Low back pain extending into left hip. Leg pain goes to the knee. Low back pain extending into the left hip and lower extremity to the knee. Pain extends along the posterior aspect of the leg, following a left S1 radicular pattern. EXAM: MRI LUMBAR SPINE WITHOUT CONTRAST TECHNIQUE: Multiplanar, multisequence MR imaging of the lumbar spine was performed. No intravenous contrast was administered. COMPARISON:  Lumbar spine radiographs 04/04/2017. FINDINGS: Segmentation: 5 non rib-bearing lumbar type vertebral bodies are present. Alignment:  AP alignment is anatomic. Vertebrae:  Marrow signal and vertebral body heights are normal. Conus medullaris and cauda equina: Conus extends to the L1-2 level. Conus and cauda equina appear normal. Paraspinal and other soft tissues: Limited imaging of the abdomen is unremarkable. There is no significant adenopathy. Disc levels: L1-2: Negative. L2-3: Negative. L3-4: Negative. L4-5: Moderate facet hypertrophy is present bilaterally. A left-sided synovial cyst and leftward disc protrusion contribute to moderate left subarticular and foraminal stenosis. Mild right foraminal narrowing is present. L5-S1. Prominent epidural fat is present. Mild facet hypertrophy is noted. There is no focal stenosis. IMPRESSION: 1. Moderate left  subarticular and foraminal stenosis at L4-5 secondary to a leftward disc protrusion and asymmetric left-sided facet hypertrophy with a synovial cyst likely impacts the left L4 and L5 nerve roots. 2. Mild right foraminal narrowing at L4-5. 3. 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 Electronically Signed   By: San Morelle M.D.   On: 04/16/2017 14:43    Assessment/Plan  Visual hallucination In setting of hx of depression, may be developed psychotic features, the patient stated it getting better, Neurology consultation 06/15/17, update CBC CMP TSH Vit B12 lipid panel 06/18/27  Depression, recurrent (HCC) Mood seems stable, continue Effexor '225mg'$  po daily.   CAD (coronary artery disease) S/p CABG, continue Isosorbide '60mg'$  daily, Renoxa '1000mg'$  po bid, prn NTG, ASA, Atorvastatin, Plavix.   Sleep apnea CPAP  Hypertonicity of bladder Average nocturnal urination x2, continue Myrbetriq, sometime urinary leakage  Bilateral lower extremity edema Trace edema, continue Furosemide '40mg'$  daily. Update CMP  Anemia, unspecified Update CBC  GERD (gastroesophageal reflux disease) Occasional indigestion, continue Ranitidine.   Essential hypertension Blood pressure is controlled, continue Metoprolol '100mg'$  qd, Amlodipine '5mg'$  daily.   Hypothyroidism Continue Levothyroxine 95mg daily, update TSH   Abnormal movement Twitching movements, f/u Neurology.   Family/ staff Communication: plan of care reviewed with the patient.   Labs/tests ordered: CBC CMP TSH lipid panel Vit B12  Time spend 45 minutes.

## 2017-06-10 NOTE — Assessment & Plan Note (Signed)
Blood pressure is controlled, continue Metoprolol 100mg  qd, Amlodipine 5mg  daily.

## 2017-06-10 NOTE — Assessment & Plan Note (Signed)
Update CBC. 

## 2017-06-10 NOTE — Assessment & Plan Note (Addendum)
S/p CABG, continue Isosorbide 60mg  daily, Renoxa 1000mg  po bid, prn NTG, ASA, Atorvastatin, Plavix.

## 2017-06-10 NOTE — Patient Instructions (Signed)
F/u in 3 months, labs next Thursday.

## 2017-06-10 NOTE — Assessment & Plan Note (Signed)
Trace edema, continue Furosemide 40mg  daily. Update CMP

## 2017-06-11 ENCOUNTER — Encounter: Payer: Self-pay | Admitting: Nurse Practitioner

## 2017-06-11 DIAGNOSIS — W19XXXA Unspecified fall, initial encounter: Secondary | ICD-10-CM | POA: Insufficient documentation

## 2017-06-11 DIAGNOSIS — M353 Polymyalgia rheumatica: Secondary | ICD-10-CM | POA: Insufficient documentation

## 2017-06-15 ENCOUNTER — Encounter: Payer: Self-pay | Admitting: Neurology

## 2017-06-15 ENCOUNTER — Ambulatory Visit (INDEPENDENT_AMBULATORY_CARE_PROVIDER_SITE_OTHER): Payer: Medicare Other | Admitting: Neurology

## 2017-06-15 VITALS — BP 115/61 | HR 65 | Ht <= 58 in | Wt 209.5 lb

## 2017-06-15 DIAGNOSIS — R251 Tremor, unspecified: Secondary | ICD-10-CM | POA: Diagnosis not present

## 2017-06-15 DIAGNOSIS — R2681 Unsteadiness on feet: Secondary | ICD-10-CM | POA: Insufficient documentation

## 2017-06-15 DIAGNOSIS — R269 Unspecified abnormalities of gait and mobility: Secondary | ICD-10-CM

## 2017-06-15 NOTE — Progress Notes (Signed)
PATIENT: Erin Ingram DOB: 1940/04/15  Chief Complaint  Patient presents with  . Memory loss/hallucinations    MMSE 30/30 - 9 animals.  Reports a decline in her memory, word finding difficulty and intermittent visual halluciations.  . Other concerns    She is also concerned about her inconsistent sleep pattern (too much one day and not enough others), full body tremors and gait difficulty causing falls (using rolling walker to assist with ambulation).     HISTORICAL  Erin Ingram is a 77 year old right-handed female, seen in refer by her primary care physician Dr. Hulan Fess for evaluation of possible TIA, initial evaluation was January 23 2016,  She and her husband lives at friend's home independent living, had a history of coronary artery disease, hypertension, hyperlipidemia, multivessel CABG in March 2000, obstructive sleep apnea, on CPAP machine, depression with anxiety, polymyalgia rheumatica was diagnosed in December 2014, on chronic prednisone treatment, variable dose over the years, hypothyroidism, chronic kidney disease, anemia of chronic kidney disease, allergic to multiple medications, including statins cause muscle achy pain, she use walker intermittently due to leg pain and weakness. For polymyalgia rheumatica, she has been treated with low-dose prednisone 10 mg for many years, once she is tapering down the medication she had recurrent symptoms.  She presented to the emergency room January 07 2016, it was her routine to play cards with her friend every Tuesday afternoon, around 2:15, after lunch, she was sitting playing with her friend, she noticed mild frontal headaches, dark vision in the center of her visual field, with lattice bright light shining through, she noticed mild difficulty shuffling her card, symptoms last about 30 minutes, she denies confusion, she was able to finish playing card, won the game. She was later taken to the emergency room for evaluation, she  is already on aspirin and Plavix  I personally reviewed MRI of the brain without contrast, generalized atrophy, mild periventricular small vessel disease, no acute abnormalities,    Echocardiogram in October 2016: Left ventricle:  The cavity size was normal. There was moderate concentric hypertrophy. Systolic function was normal. The estimated ejection fraction was in the range of 55% to 60%. Wall motion was Normal; Cardio catheter in June 2017, severe three-vessel coronary artery disease, elevated left ventricular diastolic pressure 33 mmHg, she was given Lasix to help reduce intravascular volume  Labs: negative troponin times 3, CMP showed elevated creatinine 1.26, glucose 146,CBC showed hemoglobin 10 point 8, June 2017 normal TSH, A1c 5.6, total cholesterol 163, LDL 66,  Ultrasound of carotid artery showed moderate heterogeneous and calcified plaque, but no hemodynamic significant stenosis.  Update June 15, 2017: Last visit was in November 2017,  She came in with a long list of new complaints, last few months, she noticed twitching of fingers, hands, arms, legs, mouth, face, eyes, usually when lying down before sleep, this has been ongoing for 6 months, intermittent, but gradually getting worse, each episode lasted for few seconds.  Visual hallucination since Jan 2019, now it has stopped, it happened before she goes to sleep, she relaized it is not real, lasting for few seconds  She is very sedentary, use walkers more, worsening mild memory loss,  MRI of lumbar in January 2019, multilevel degenerative disc disease, moderate left subarticular and foraminal stenosis at L4-5, secondary to a left 4 disc protrusion and asymmetric left-sided facet hypertrophy, results in obvious is slightly impacted left L4, L5 nerve roots,  MRI of the brain in January 2019 generalized atrophy,  ventriculomegaly, periventricular white matter disease, cervical degenerative changes,  REVIEW OF SYSTEMS: Full 14  system review of systems performed and notable only for appetite change, shortness of breath, neck swelling, abdomen swelling, restless leg, insomnia, tremor, memory loss  ALLERGIES: Allergies  Allergen Reactions  . Nsaids Other (See Comments)    Due to chronic kidney failure  . Butorphanol Other (See Comments)    agitation Constipation Produced a lot of urine, made patient feel crazy  . Sulfa Antibiotics Rash  . Bisoprolol Fumarate Other (See Comments)    Doesn't remember  . Butorphanol Tartrate Other (See Comments)    Produced a lot of urine, made patient feel crazy  . Codeine Nausea And Vomiting  . Demerol [Meperidine] Nausea And Vomiting  . Imipramine     Sweating, facial dysfunction   . Statins     MYALGIAS  . Tequin [Gatifloxacin] Other (See Comments)    Caused hypoglycemia Low blood sugar  . Brilinta [Ticagrelor] Rash    CAUSES PETECHIAE, PURPURA  . Pseudoephedrine Hcl Palpitations  . Septra [Sulfamethoxazole-Trimethoprim] Rash  . Sulfamethoxazole-Trimethoprim Rash    HOME MEDICATIONS: Current Outpatient Medications  Medication Sig Dispense Refill  . alendronate (FOSAMAX) 70 MG tablet Take 70 mg by mouth once a week. Take with a full glass of water on an empty stomach.    Marland Kitchen amLODipine (NORVASC) 5 MG tablet TAKE 1 TABLET DAILY. (Patient taking differently: TAKE 5 mg TABLET DAILY.) 90 tablet 2  . aspirin 81 MG chewable tablet Chew 1 tablet (81 mg total) by mouth daily.    Marland Kitchen atorvastatin (LIPITOR) 10 MG tablet TAKE 1 TABLET ONCE DAILY. 90 tablet 2  . Calcium Glycerophosphate (PRELIEF PO) Take 2 tablets by mouth as needed.     . Cholecalciferol (VITAMIN D PO) Take 2,000 Units by mouth daily.     . Cinnamon 500 MG TABS Take 1 each by mouth daily.    . clopidogrel (PLAVIX) 75 MG tablet TAKE 1 TABLET ONCE DAILY. (Patient taking differently: TAKE 75 mg TABLET ONCE DAILY.) 90 tablet 2  . Coenzyme Q10 10 MG capsule Take 10 mg by mouth at bedtime.     . fluticasone (FLONASE)  50 MCG/ACT nasal spray Place 2 sprays into both nostrils daily as needed for allergies.     . furosemide (LASIX) 20 MG tablet Take 40 mg by mouth daily.    . isosorbide mononitrate (IMDUR) 60 MG 24 hr tablet Take 60 mg by mouth daily.    Marland Kitchen levocetirizine (XYZAL) 5 MG tablet Take 5 mg by mouth every evening.    Marland Kitchen levothyroxine (SYNTHROID, LEVOTHROID) 75 MCG tablet Take 75 mcg by mouth daily before breakfast.     . metoprolol succinate (TOPROL-XL) 100 MG 24 hr tablet Take 1 tablet (100 mg total) by mouth daily. Please call and schedule August 2018 follow up. [phone] (Patient taking differently: Take 100 mg by mouth at bedtime. Please call and schedule August 2018 follow up. [phone]) 90 tablet 3  . mirabegron ER (MYRBETRIQ) 50 MG TB24 tablet Take 50 mg by mouth at bedtime.     . nitroGLYCERIN (NITROSTAT) 0.4 MG SL tablet Place 0.4 mg under the tongue every 5 (five) minutes as needed for chest pain. X 3 doses    . NONFORMULARY OR COMPOUNDED ITEM [facility]:  [place] Tendonitis Cream - Diclofenac 3%, Baclofen 2%, Bupivacaine 1%, GAbapentin 6%, Ibuprofen 3%, Pentoxifylline 3%, apply 1-2 grams to affected area 3-4 times daily. 120 each 2  . predniSONE (DELTASONE) 5 MG  tablet Take 7.5 mg by mouth daily. Take 1.5 tablet= 7.'5mg'$  with food or milk by mouth once daily.    . Psyllium 400 MG CAPS Take 2,800 mg by mouth daily.     . ranitidine (ZANTAC) 150 MG capsule Take 150 mg by mouth 2 (two) times daily.    . ranolazine (RANEXA) 1000 MG SR tablet Take 1 tablet (1,000 mg total) by mouth 2 (two) times daily. 180 tablet 1  . venlafaxine XR (EFFEXOR-XR) 75 MG 24 hr capsule Take 225 mg by mouth daily with breakfast. Patient takes 3 capsules     No current facility-administered medications for this visit.     PAST MEDICAL HISTORY: Past Medical History:  Diagnosis Date  . (HFpEF) heart failure with preserved ejection fraction (HCC)    a. normal EF, LVEDP at [facility] in 6/17 33 >> Lasix started   .  Anemia   . Arthritis    "fingers, back, shoulders, hips" (04/10/2016)  . Chronic diastolic CHF (congestive heart failure) ([place])   . CKD (chronic kidney disease), stage III ([place])   . Coronary artery disease    a. s/p CABG 2000 (SVG/ free LIMA Y graft to the diagonal and distal LAD, SVG to the OM 1, and SVG to the PDA) . b. Cath 12/19/2014 90% dSVG to RCA s/p 2 overlapping DES. c. LHC 2016, 2017 no intervention except diuresis needed. d. Low risk nuc 03/2016.  . Depression    with anxious component  . GERD (gastroesophageal reflux disease)   . History of hiatal hernia   . Hyperlipidemia   . Hypothyroidism   . Obesity   . OSA on CPAP   . PMR (polymyalgia rheumatica) (HCC)   . Pneumonia    "2-3 times" (04/10/2016)  . Stable angina (HCC)    microvascular, improved with Ranexa  . Subclavian artery stenosis ([place])   . TIA (transient ischemic attack)     PAST SURGICAL HISTORY: Past Surgical History:  Procedure Laterality Date  . BREAST BIOPSY Right 1964  . CARDIAC CATHETERIZATION  08   patent grafts, no culprit lesions, EF 65%  . CARDIAC CATHETERIZATION N/A 12/19/2014   Procedure: Left Heart Cath and Cors/Grafts Angiography;  Surgeon: Jettie Booze, MD;  Location: [place] [facility];  Service: Cardiovascular;  Laterality: N/A;  . CARDIAC CATHETERIZATION N/A 12/19/2014   Procedure: Coronary Stent Intervention;  Surgeon: Jettie Booze, MD;  Location: [place] [facility];  Service: Cardiovascular;  Laterality: N/A;  . CARDIAC CATHETERIZATION N/A 01/01/2015   Procedure: Left Heart Cath and Cors/Grafts Angiography;  Surgeon: Jettie Booze, MD;  Location: [place] [facility];  Service: Cardiovascular;  Laterality: N/A;  . CARDIAC CATHETERIZATION N/A 09/20/2015   Procedure: Left Heart Cath and Cors/Grafts Angiography;  Surgeon: Jettie Booze, MD;  Location: [place] [facility];  Service: Cardiovascular;  Laterality: N/A;  . CATARACT EXTRACTION W/ INTRAOCULAR LENS  IMPLANT,  BILATERAL Bilateral 2011  . COLONOSCOPY WITH PROPOFOL N/A 07/27/2016   Procedure: COLONOSCOPY WITH PROPOFOL;  Surgeon: Wilford Corner, MD;  Location: [place];  Service: Endoscopy;  Laterality: N/A;  . CORONARY ARTERY BYPASS GRAFT  2000   ASCVD, multivessel, S./P.  . DILATION AND CURETTAGE OF UTERUS  1980s  . ESOPHAGOGASTRODUODENOSCOPY (EGD) WITH PROPOFOL N/A 07/27/2016   Procedure: ESOPHAGOGASTRODUODENOSCOPY (EGD) WITH PROPOFOL;  Surgeon: Wilford Corner, MD;  Location: [place];  Service: Endoscopy;  Laterality: N/A;  . FRACTURE SURGERY    . [place]  HISTORY: Family History  Problem Relation Age of Onset  . Heart disease Mother   . Heart attack Mother   . Heart disease Father   . Heart attack Father   . Diabetes Brother   . Pulmonary embolism Brother   . Stroke Paternal Grandmother   . Hypertension Neg Hx     SOCIAL HISTORY:  Social History   Socioeconomic History  . Marital status: Married    Spouse name: Not on file  . Number of children: 2  . Years of education: College  . Highest education level: Not on file  Occupational History  . Occupation: Retired Cytogeneticist  Social Needs  . Financial resource strain: Not on file  . Food insecurity:    Worry: Not on file    Inability: Not on file  . Transportation needs:    Medical: Not on file    Non-medical: Not on file  Tobacco Use  . Smoking status: Former Smoker    Packs/day: 0.50    Years: 7.00    Pack years: 3.50    Types: Cigarettes    Last attempt to quit: 08/21/1969    Years since quitting: 47.8  . Smokeless tobacco: Never Used  Substance and Sexual Activity  . Alcohol use: No    Alcohol/week: 0.0 oz    Frequency: Never    Comment: rare  . Drug use: No  . Sexual activity: Never  Lifestyle  . Physical activity:    Days per week: Not on file    Minutes per session: Not on file  . Stress: Not on file  Relationships  . Social connections:    Talks on  phone: Not on file    Gets together: Not on file    Attends religious service: Not on file    Active member of club or organization: Not on file    Attends meetings of clubs or organizations: Not on file    Relationship status: Not on file  . Intimate partner violence:    Fear of current or ex partner: Not on file    Emotionally abused: Not on file    Physically abused: Not on file    Forced sexual activity: Not on file  Other Topics Concern  . Not on file  Social History Narrative   Social History     Social History Narrative       Lives in [place] with husband.   Diet: Regular   Do you drink/eat things with caffeine? 1 cup caffeine per day.  Not much coffee   Marital status: Married                           What year were you married? 1964   Do you live in a house, apartment, assisted living, condo, trailer, etc)? [facility] at [facility]   Is it one or more stories?   How many persons live in your home? 2   Do you have any pets in your home? No   Current or past profession: Engineer, production (RN)   Do you exercise? I did                                                Type & how often: Swimming, Tai Chi, exercise classes   Do you have  a living will? yes   Do you have a DNR Form? Yes   Do you have a POA/HPOA forms?               PHYSICAL EXAM   Vitals:   06/15/17 0915  BP: 115/61  Pulse: 65  Weight: 209 lb 8 oz (95 kg)  Height: '4\' 10"'$  (1.473 m)    Not recorded      Body mass index is 43.79 kg/m.  PHYSICAL EXAMNIATION:  Gen: NAD, conversant, well nourised, obese, well groomed                     Cardiovascular: Regular rate rhythm, no peripheral edema, warm, nontender. Eyes: Conjunctivae clear without exudates or hemorrhage Neck: Supple, no carotid bruits. Pulmonary: Clear to auscultation bilaterally   NEUROLOGICAL EXAM: MMSE - Mini Mental State Exam 06/15/2017 06/10/2017  Orientation to time 5 5  Orientation to Place 5 5  Registration 3 3  Attention/  Calculation 5 5  Recall 3 3  Language- name 2 objects 2 2  Language- repeat 1 1  Language- follow 3 step command 3 3  Language- read & follow direction 1 1  Write a sentence 1 1  Copy design 1 1  Total score 30 30  Animal naming 9   CRANIAL NERVES: CN II: Visual fields are full to confrontation. Fundoscopic exam is normal with sharp discs and no vascular changes. Pupils are round equal and briskly reactive to light. CN III, IV, VI: extraocular movement are normal. No ptosis. CN V: Facial sensation is intact to pinprick in all 3 divisions bilaterally. Corneal responses are intact.  CN VII: Face is symmetric with normal eye closure and smile. CN VIII: Hearing is normal to rubbing fingers CN IX, X: Palate elevates symmetrically. Phonation is normal. CN XI: Head turning and shoulder shrug are intact CN XII: Tongue is midline with normal movements and no atrophy.  MOTOR: There is no pronator drift of out-stretched arms. Muscle bulk and tone are normal. Muscle strength is normal.  REFLEXES: Reflexes are 2+ and symmetric at the biceps, triceps, knees, and ankles. Plantar responses are flexor.  SENSORY: Intact to light touch, pinprick, positional sensation and vibratory sensation are intact in fingers and toes.  COORDINATION: Rapid alternating movements and fine finger movements are intact. There is no dysmetria on finger-to-nose and heel-knee-shin.    GAIT/STANCE: She has obesity, right plantar fasciitis, rely on her walker, mildly unsteady, antalgic gait   DIAGNOSTIC DATA (LABS, IMAGING, TESTING) - I reviewed patient records, labs, notes, testing and imaging myself where available.   ASSESSMENT AND PLAN  Erin Ingram is a 77 y.o. female   Visual hallucinations  Transient, improved,  Could indicate underlying central nervous system degenerative disorder  I have suggested moderate exercise Worsening gait abnormality  Gait abnormality multifactorial, obesity,  deconditioning, sedentary lifestyle  Refer her to physical therapy   Marcial Pacas, M.D. Ph.D.  [facility] [address] [place],  [address] Ph: [phone] Fax: [phone]  CC: Hulan Fess, MD

## 2017-06-17 ENCOUNTER — Telehealth: Payer: Self-pay | Admitting: *Deleted

## 2017-06-17 ENCOUNTER — Other Ambulatory Visit: Payer: Medicare Other

## 2017-06-17 NOTE — Telephone Encounter (Signed)
Resident missed her lab appointment, called to inform her that she has been rescheduled for Tuesday 06/22/2017 at 7:00am. She stated that she will be there on Tuesday.

## 2017-06-22 ENCOUNTER — Other Ambulatory Visit: Payer: Medicare Other

## 2017-06-23 ENCOUNTER — Other Ambulatory Visit: Payer: Self-pay | Admitting: Interventional Cardiology

## 2017-06-23 NOTE — Telephone Encounter (Signed)
Called to speak to patient to clarify lasix dosing, but there was no answer. Left message for patient to call back.

## 2017-06-23 NOTE — Telephone Encounter (Signed)
Please advise on current dose of furosemide.

## 2017-06-24 ENCOUNTER — Other Ambulatory Visit: Payer: Medicare Other

## 2017-06-24 NOTE — Telephone Encounter (Signed)
Patient returned call. Patient states that she has been taking lasix 20 mg QD. She states that she has only had to take one extra pill in the last year. Refill sent in for lasix 20 mg QD.

## 2017-06-25 ENCOUNTER — Encounter: Payer: Self-pay | Admitting: Nurse Practitioner

## 2017-06-25 DIAGNOSIS — D72829 Elevated white blood cell count, unspecified: Secondary | ICD-10-CM | POA: Insufficient documentation

## 2017-06-25 LAB — LIPID PANEL
Cholesterol: 156 mg/dL (ref <200)
HDL: 66 mg/dL (ref >50)
LDL Cholesterol (Calc): 71 mg/dL (calc)
Non-HDL Cholesterol (Calc): 90 mg/dL (calc) (ref <130)
Total CHOL/HDL Ratio: 2.4 (calc) (ref <5.0)
Triglycerides: 111 mg/dL (ref <150)

## 2017-06-25 LAB — COMPLETE METABOLIC PANEL WITH GFR
AG Ratio: 1.8 (calc) (ref 1.0–2.5)
ALT: 8 U/L (ref 6–29)
AST: 10 U/L (ref 10–35)
Albumin: 3.7 g/dL (ref 3.6–5.1)
Alkaline phosphatase (APISO): 44 U/L (ref 33–130)
BUN/Creatinine Ratio: 21 (calc) (ref 6–22)
BUN: 25 mg/dL (ref 7–25)
CO2: 27 mmol/L (ref 20–32)
Calcium: 9.4 mg/dL (ref 8.6–10.4)
Chloride: 106 mmol/L (ref 98–110)
Creat: 1.18 mg/dL — ABNORMAL HIGH (ref 0.60–0.93)
GFR, Est African American: 52 mL/min/{1.73_m2} — ABNORMAL LOW (ref >=60)
GFR, Est Non African American: 44 mL/min/{1.73_m2} — ABNORMAL LOW (ref >=60)
Globulin: 2.1 g/dL (calc) (ref 1.9–3.7)
Glucose, Bld: 90 mg/dL (ref 65–99)
Potassium: 4.3 mmol/L (ref 3.5–5.3)
Sodium: 143 mmol/L (ref 135–146)
Total Bilirubin: 0.3 mg/dL (ref 0.2–1.2)
Total Protein: 5.8 g/dL — ABNORMAL LOW (ref 6.1–8.1)

## 2017-06-25 LAB — CBC
HCT: 31.7 % — ABNORMAL LOW (ref 35.0–45.0)
Hemoglobin: 10.8 g/dL — ABNORMAL LOW (ref 11.7–15.5)
MCH: 30.7 pg (ref 27.0–33.0)
MCHC: 34.1 g/dL (ref 32.0–36.0)
MCV: 90.1 fL (ref 80.0–100.0)
MPV: 8.9 fL (ref 7.5–12.5)
Platelets: 289 10*3/uL (ref 140–400)
RBC: 3.52 10*6/uL — ABNORMAL LOW (ref 3.80–5.10)
RDW: 14.1 % (ref 11.0–15.0)
WBC: 15.4 10*3/uL — ABNORMAL HIGH (ref 3.8–10.8)

## 2017-06-25 LAB — TSH: TSH: 2.56 mIU/L (ref 0.40–4.50)

## 2017-06-25 LAB — VITAMIN B12: Vitamin B-12: 288 pg/mL (ref 200–1100)

## 2017-07-29 ENCOUNTER — Telehealth: Payer: Self-pay | Admitting: Interventional Cardiology

## 2017-07-29 ENCOUNTER — Telehealth: Payer: Self-pay | Admitting: *Deleted

## 2017-07-29 NOTE — Telephone Encounter (Signed)
Erin Ingram called to inform [facility] of her hearing her pulse in her left ear but not her right ear. She also stated that she took her BP , and it was 130/62 on one side and 145/74 on the other side. She stated that she did call her cardiologist to inform them  as well and spoke to the nurse.

## 2017-07-29 NOTE — Telephone Encounter (Signed)
Returned call to patient who states that she sometimes feels dizzy with position changes. Patient states that her BP readings have been 130/62, 145/74, 128/60, and 132/64 with HR 60-65. Patient states that sometimes she can hear her pulse in her left ear. Patient denies any chest pain, SOB, syncope, HAs, vision changes, palpitations, or any other symptoms. Patient has been compliant with current medication regimen. Advised patient to change positions slowly and stay hydrated. Instructed patient to let us know if her symptoms change or worsen. Patient verbalized understanding and thanked me for the call.

## 2017-07-29 NOTE — Telephone Encounter (Signed)
Pt c/o BP issue: STAT if pt c/o blurred vision, one-sided weakness or slurred speech  1. What are your last 5 BP readings?  130/62     145/74   2. Are you having any other symptoms (ex. Dizziness, headache, blurred vision, passed out)?  dizzy  3. What is your BP issue? Pt hears her pulse in her left ear

## 2017-09-09 ENCOUNTER — Encounter: Payer: Self-pay | Admitting: Nurse Practitioner

## 2017-09-09 ENCOUNTER — Encounter: Payer: Medicare Other | Admitting: Nurse Practitioner

## 2017-09-09 ENCOUNTER — Non-Acute Institutional Stay: Payer: Medicare Other | Admitting: Nurse Practitioner

## 2017-09-09 DIAGNOSIS — I5030 Unspecified diastolic (congestive) heart failure: Secondary | ICD-10-CM

## 2017-09-09 DIAGNOSIS — K219 Gastro-esophageal reflux disease without esophagitis: Secondary | ICD-10-CM

## 2017-09-09 DIAGNOSIS — I200 Unstable angina: Secondary | ICD-10-CM | POA: Diagnosis not present

## 2017-09-09 DIAGNOSIS — R251 Tremor, unspecified: Secondary | ICD-10-CM

## 2017-09-09 DIAGNOSIS — Z7189 Other specified counseling: Secondary | ICD-10-CM

## 2017-09-09 DIAGNOSIS — R441 Visual hallucinations: Secondary | ICD-10-CM | POA: Diagnosis not present

## 2017-09-09 DIAGNOSIS — I10 Essential (primary) hypertension: Secondary | ICD-10-CM | POA: Diagnosis not present

## 2017-09-09 DIAGNOSIS — N318 Other neuromuscular dysfunction of bladder: Secondary | ICD-10-CM | POA: Diagnosis not present

## 2017-09-09 DIAGNOSIS — M353 Polymyalgia rheumatica: Secondary | ICD-10-CM | POA: Diagnosis not present

## 2017-09-09 DIAGNOSIS — F339 Major depressive disorder, recurrent, unspecified: Secondary | ICD-10-CM

## 2017-09-09 DIAGNOSIS — E039 Hypothyroidism, unspecified: Secondary | ICD-10-CM

## 2017-09-09 NOTE — Assessment & Plan Note (Signed)
CAD no angina since last visited, continue Ranolazine 1000mg  bid, prn NTG, Imdur 60mg  qd, Plavix 75mg , Lipitor 10mg , ASA 81mg ,  f/u Cardiology.

## 2017-09-09 NOTE — Assessment & Plan Note (Signed)
Hx of depression, stable Effexor 225mg  qd

## 2017-09-09 NOTE — Assessment & Plan Note (Signed)
She has full body tremor(arms and legs once in May lasted about 20 sec), observe.

## 2017-09-09 NOTE — Assessment & Plan Note (Signed)
Continue Prednisone 7.5mg  and 5mg  alternating daily for myalgia rheumatica, stable, goal is to taper off Prednisone.

## 2017-09-09 NOTE — Assessment & Plan Note (Addendum)
Goals of care discussed with the patient from 9:30am to 9;55am. She desires DNR when she is breathing and heart stops beating. Went over and filled out MOST. The patient would like to be transferred to hospital with limited interventions and avoid ICU. She agrees antibiotic and IVF for determined period of use. No feeding tube. Form singed by the patient and myself. Copies made for chart and patient.

## 2017-09-09 NOTE — Assessment & Plan Note (Signed)
history of visual hallucination, resolved with no intervention. Could indicated underlying central nervous system degenerative disorder per Neurology. Underwent neurology evaluation 06/15/17, will  f/u 6 months.

## 2017-09-09 NOTE — Progress Notes (Signed)
Location:   Clinic [facility]   Place of Service:  Clinic (12) Provider: Marlana Latus NP  Code Status: DNR Goals of Care: living will, MOST, DNR Advanced Directives 04/04/2017  Does Patient Have a Medical Advance Directive? No;Yes  Type of Advance Directive [place]  Does patient want to make changes to medical advance directive? -  Copy of [place] in Chart? No - copy requested  Would patient like information on creating a medical advance directive? Yes (ED - Information included in AVS)     Chief Complaint  Patient presents with  . Medical Management of Chronic Issues    F/u- 57mo    HPI: Patient is a 77 y.o. female seen today for evaluation of chronic medical conditions   The patient has history of visual hallucination, resolved with no intervention. Could indicated underlying central nervous system degenerative disorder. Underwent neurology evaluation 06/15/17, will  f/u 6 months. She has full body tremor(arms and legs once in May lasted about 20 sec), gait difficulty, no fall since last visited, ambulates with walker. Insomnia, sleeps too much one and not enough some others.   Hx of depression, stable Effexor 225mg  qd, CAD no angina since last visited Ranolazine 1000mg  bid, prn NTG, Imdur 60mg  qd, Plavix 75mg , Lipitor 10mg , ASA 81mg ,  f/u Cardiology. GERD stable on Ranitidine 150mg  qd. Taking Prednisone 7.5mg  and 5mg  alternating daily for myalgia rheumatica, controlled. HTN blood pressure on Metoprolol 100mg  qd, Amlodipine 5mg  qd. Hypothyroidism on Levothyroxine 35mcg, TSH 2.56 06/24/17.   Goals of care discussed with the patient from 9:30am to 9;55am Past Medical History:  Diagnosis Date  . (HFpEF) heart failure with preserved ejection fraction (HCC)    a. normal EF, LVEDP at [facility] in 6/17 33 >> Lasix started   . Anemia   . Arthritis    "fingers, back, shoulders, hips" (04/10/2016)  . Chronic diastolic CHF (congestive heart failure) ([place])   . CKD  (chronic kidney disease), stage III ([place])   . Coronary artery disease    a. s/p CABG 2000 (SVG/ free LIMA Y graft to the diagonal and distal LAD, SVG to the OM 1, and SVG to the PDA) . b. Cath 12/19/2014 90% dSVG to RCA s/p 2 overlapping DES. c. LHC 2016, 2017 no intervention except diuresis needed. d. Low risk nuc 03/2016.  . Depression    with anxious component  . GERD (gastroesophageal reflux disease)   . History of hiatal hernia   . Hyperlipidemia   . Hypothyroidism   . Obesity   . OSA on CPAP   . PMR (polymyalgia rheumatica) (HCC)   . Pneumonia    "2-3 times" (04/10/2016)  . Stable angina (HCC)    microvascular, improved with Ranexa  . Subclavian artery stenosis ([place])   . TIA (transient ischemic attack)     Past Surgical History:  Procedure Laterality Date  . BREAST BIOPSY Right 1964  . CARDIAC CATHETERIZATION  08   patent grafts, no culprit lesions, EF 65%  . CARDIAC CATHETERIZATION N/A 12/19/2014   Procedure: Left Heart Cath and Cors/Grafts Angiography;  Surgeon: Jettie Booze, MD;  Location: [place] [facility];  Service: Cardiovascular;  Laterality: N/A;  . CARDIAC CATHETERIZATION N/A 12/19/2014   Procedure: Coronary Stent Intervention;  Surgeon: Jettie Booze, MD;  Location: [place] [facility];  Service: Cardiovascular;  Laterality: N/A;  . CARDIAC CATHETERIZATION N/A 01/01/2015   Procedure: Left Heart Cath and Cors/Grafts Angiography;  Surgeon: Jettie Booze,  MD;  Location: [place] [facility];  Service: Cardiovascular;  Laterality: N/A;  . CARDIAC CATHETERIZATION N/A 09/20/2015   Procedure: Left Heart Cath and Cors/Grafts Angiography;  Surgeon: Jettie Booze, MD;  Location: [place] [facility];  Service: Cardiovascular;  Laterality: N/A;  . CATARACT EXTRACTION W/ INTRAOCULAR LENS  IMPLANT, BILATERAL Bilateral 2011  . COLONOSCOPY WITH PROPOFOL N/A 07/27/2016   Procedure: COLONOSCOPY WITH PROPOFOL;  Surgeon: Wilford Corner, MD;  Location: [place];   Service: Endoscopy;  Laterality: N/A;  . CORONARY ARTERY BYPASS GRAFT  2000   ASCVD, multivessel, S./P.  . DILATION AND CURETTAGE OF UTERUS  1980s  . ESOPHAGOGASTRODUODENOSCOPY (EGD) WITH PROPOFOL N/A 07/27/2016   Procedure: ESOPHAGOGASTRODUODENOSCOPY (EGD) WITH PROPOFOL;  Surgeon: Wilford Corner, MD;  Location: [place];  Service: Endoscopy;  Laterality: N/A;  . FRACTURE SURGERY    . PATELLA FRACTURE SURGERY Left 1993    Allergies  Allergen Reactions  . Nsaids Other (See Comments)    Due to chronic kidney failure  . Butorphanol Other (See Comments)    agitation Constipation Produced a lot of urine, made patient feel crazy  . Sulfa Antibiotics Rash  . Bisoprolol Fumarate Other (See Comments)    Doesn't remember  . Butorphanol Tartrate Other (See Comments)    Produced a lot of urine, made patient feel crazy  . Codeine Nausea And Vomiting  . Demerol [Meperidine] Nausea And Vomiting  . Imipramine     Sweating, facial dysfunction   . Meperidine And Related Nausea And Vomiting  . Statins     MYALGIAS  . Tequin [Gatifloxacin] Other (See Comments)    Caused hypoglycemia Low blood sugar  . Brilinta [Ticagrelor] Rash    CAUSES PETECHIAE, PURPURA  . Pseudoephedrine Hcl Palpitations  . Septra [Sulfamethoxazole-Trimethoprim] Rash  . Sulfamethoxazole-Trimethoprim Rash    Allergies as of 09/09/2017      Reactions   Nsaids Other (See Comments)   Due to chronic kidney failure   Butorphanol Other (See Comments)   agitation Constipation Produced a lot of urine, made patient feel crazy   Sulfa Antibiotics Rash   Bisoprolol Fumarate Other (See Comments)   Doesn't remember   Butorphanol Tartrate Other (See Comments)   Produced a lot of urine, made patient feel crazy   Codeine Nausea And Vomiting   Demerol [meperidine] Nausea And Vomiting   Imipramine    Sweating, facial dysfunction    Meperidine And Related Nausea And Vomiting   Statins    MYALGIAS   Tequin [gatifloxacin]  Other (See Comments)   Caused hypoglycemia Low blood sugar   Brilinta [ticagrelor] Rash   CAUSES PETECHIAE, PURPURA   Pseudoephedrine Hcl Palpitations   Septra [sulfamethoxazole-trimethoprim] Rash   Sulfamethoxazole-trimethoprim Rash      Medication List        Accurate as of 09/09/17 11:59 PM. Always use your most recent med list.          alendronate 70 MG tablet Commonly known as:  FOSAMAX Take 70 mg by mouth once a week. Take with a full glass of water on an empty stomach.   amLODipine 5 MG tablet Commonly known as:  NORVASC TAKE 1 TABLET DAILY.   aspirin 81 MG chewable tablet Chew 1 tablet (81 mg total) by mouth daily.   atorvastatin 10 MG tablet Commonly known as:  LIPITOR TAKE 1 TABLET ONCE DAILY.   Cinnamon 500 MG Tabs Take 1 each by mouth daily.   clopidogrel 75 MG tablet Commonly known as:  PLAVIX TAKE  1 TABLET ONCE DAILY.   Coenzyme Q10 10 MG capsule Take 10 mg by mouth at bedtime.   fluticasone 50 MCG/ACT nasal spray Commonly known as:  FLONASE Place 2 sprays into both nostrils daily as needed for allergies.   furosemide 20 MG tablet Commonly known as:  LASIX Take 1 tablet (20 mg total) by mouth daily.   GLUCOSAMINE 1500 COMPLEX Caps Take 2 capsules by mouth daily after breakfast.   isosorbide mononitrate 60 MG 24 hr tablet Commonly known as:  IMDUR Take 60 mg by mouth daily.   levocetirizine 5 MG tablet Commonly known as:  XYZAL Take 5 mg by mouth every evening.   levothyroxine 75 MCG tablet Commonly known as:  SYNTHROID, LEVOTHROID Take 75 mcg by mouth daily before breakfast.   Melatonin 1 MG Tabs Take 1 tablet by mouth at bedtime as needed.   metoprolol succinate 100 MG 24 hr tablet Commonly known as:  TOPROL-XL Take 1 tablet (100 mg total) by mouth daily. Please call and schedule August 2018 follow up. [phone]   MYRBETRIQ 50 MG Tb24 tablet Generic drug:  mirabegron ER Take 50 mg by mouth at bedtime.   nitroGLYCERIN 0.4  MG SL tablet Commonly known as:  NITROSTAT Place 0.4 mg under the tongue every 5 (five) minutes as needed for chest pain. X 3 doses   NONFORMULARY OR COMPOUNDED ITEM [facility]:  [place] Tendonitis Cream - Diclofenac 3%, Baclofen 2%, Bupivacaine 1%, GAbapentin 6%, Ibuprofen 3%, Pentoxifylline 3%, apply 1-2 grams to affected area 3-4 times daily.   predniSONE 5 MG tablet Commonly known as:  DELTASONE Take 7.5 mg by mouth daily. Take 1.5 tablet= 7.5mg  with food or milk by mouth once daily.   PRELIEF PO Take 2 tablets by mouth as needed.   Psyllium 400 MG Caps Take 2,800 mg by mouth daily.   ranitidine 150 MG capsule Commonly known as:  ZANTAC Take 150 mg by mouth 2 (two) times daily.   ranolazine 1000 MG SR tablet Commonly known as:  RANEXA Take 1 tablet (1,000 mg total) by mouth 2 (two) times daily.   venlafaxine XR 75 MG 24 hr capsule Commonly known as:  EFFEXOR-XR Take 225 mg by mouth daily with breakfast. Patient takes 3 capsules   VITAMIN D PO Take 2,000 Units by mouth daily.       Review of Systems:  Review of Systems  Constitutional: Negative for activity change, appetite change, chills, diaphoresis, fatigue and fever.  HENT: Positive for hearing loss. Negative for congestion, trouble swallowing and voice change.   Eyes: Negative for visual disturbance.  Respiratory: Negative for cough, chest tightness, shortness of breath and wheezing.   Cardiovascular: Positive for leg swelling. Negative for chest pain and palpitations.  Gastrointestinal: Negative for abdominal distention, abdominal pain, constipation, diarrhea, nausea and vomiting.  Genitourinary: Negative for difficulty urinating, dysuria and urgency.       Occasional urinary leakage. Average bathroom 2x/night.   Musculoskeletal: Positive for gait problem and myalgias.       Controlled myalgia rheumatica  Skin: Negative for color change and pallor.  Neurological: Positive for tremors. Negative for  dizziness, speech difficulty, weakness and headaches.       Twitching arms and legs x1 in May since last seen.   Psychiatric/Behavioral: Positive for sleep disturbance. Negative for agitation, behavioral problems and hallucinations. The patient is not nervous/anxious.     Health Maintenance  Topic Date Due  . PNA vac Low Risk Adult (2 of 2 - PPSV23)  04/21/2014  . INFLUENZA VACCINE  10/21/2017  . TETANUS/TDAP  04/20/2022  . DEXA SCAN  Completed    Physical Exam: Vitals:   09/09/17 0855  BP: (!) 122/58  Pulse: 60  Resp: 20  Temp: 97.6 F (36.4 C)  SpO2: 97%  Weight: 205 lb 12.8 oz (93.4 kg)  Height: 4\' 10"  (1.473 m)   Body mass index is 43.01 kg/m. Physical Exam  Constitutional: She is oriented to person, place, and time. She appears well-developed and well-nourished.  HENT:  Head: Normocephalic and atraumatic.  Eyes: Pupils are equal, round, and reactive to light. EOM are normal.  Neck: Normal range of motion. Neck supple. No JVD present. No thyromegaly present.  Cardiovascular: Normal rate and regular rhythm.  No murmur heard. Pulmonary/Chest: Effort normal and breath sounds normal. She has no wheezes. She has no rales.  Abdominal: Soft. Bowel sounds are normal. She exhibits no distension. There is no tenderness. There is no rebound and no guarding.  Musculoskeletal: Normal range of motion. She exhibits edema.  Trace edema BLE. Unsteady gait, ambulates with walker.   Neurological: She is alert and oriented to person, place, and time. No cranial nerve deficit. She exhibits normal muscle tone. Coordination normal.  Skin: Skin is warm and dry.  Psychiatric: She has a normal mood and affect. Her behavior is normal. Judgment and thought content normal.    Labs reviewed: Basic Metabolic Panel: Recent Labs    11/13/16 1211 04/04/17 1935 06/24/17 0715  NA 140 139 143  K 4.2 4.4 4.3  CL 99 105 106  CO2 23 24 27   GLUCOSE 99 132* 90  BUN 24 21* 25  CREATININE 1.26* 1.37*  1.18*  CALCIUM 9.6 8.6* 9.4  TSH  --   --  2.56   Liver Function Tests: Recent Labs    06/24/17 0715  AST 10  ALT 8  BILITOT 0.3  PROT 5.8*   No results for input(s): LIPASE, AMYLASE in the last 8760 hours. No results for input(s): AMMONIA in the last 8760 hours. CBC: Recent Labs    04/04/17 1935 06/24/17 0715  WBC 12.2* 15.4*  NEUTROABS 10.6*  --   HGB 10.7* 10.8*  HCT 33.4* 31.7*  MCV 92.8 90.1  PLT 247 289   Lipid Panel: Recent Labs    06/24/17 0715  CHOL 156  HDL 66  LDLCALC 71  TRIG 111  CHOLHDL 2.4   Lab Results  Component Value Date   HGBA1C 5.6 09/18/2015    Procedures since last visit: No results found.  Assessment/Plan Advanced care planning/counseling discussion Goals of care discussed with the patient from 9:30am to 9;55am. She desires DNR when she is breathing and heart stops beating. Went over and filled out MOST. The patient would like to be transferred to hospital with limited interventions and avoid ICU. She agrees antibiotic and IVF for determined period of use. No feeding tube. Form singed by the patient and myself. Copies made for chart and patient.    Depression, recurrent (HCC) Hx of depression, stable Effexor 225mg  qd  Essential hypertension HTN blood pressure, continue Metoprolol 100mg  qd, Amlodipine 5mg  qd.   Unstable angina pectoris ([place]) CAD no angina since last visited, continue Ranolazine 1000mg  bid, prn NTG, Imdur 60mg  qd, Plavix 75mg , Lipitor 10mg , ASA 81mg ,  f/u Cardiology.  GERD (gastroesophageal reflux disease) GERD stable, continue  Ranitidine 150mg  qd.  Hypothyroidism Hypothyroidism, continue Levothyroxine 36mcg, TSH 2.56 06/24/17.   Hypertonicity of bladder Stable, continue Myrbetriq 50mg  qd.   (  HFpEF) heart failure with preserved ejection fraction ([place]) Compensated clinically, trace edema BLE, continue Furosemide.   Visual hallucination history of visual hallucination, resolved with no intervention. Could  indicated underlying central nervous system degenerative disorder per Neurology. Underwent neurology evaluation 06/15/17, will  f/u 6 months.   Polymyalgia rheumatica (HCC) Continue Prednisone 7.5mg  and 5mg  alternating daily for myalgia rheumatica, stable, goal is to taper off Prednisone.   Tremor She has full body tremor(arms and legs once in May lasted about 20 sec), observe.     Labs/tests ordered:  None  Next appt:  6 months  Time spend 25 minutes.

## 2017-09-09 NOTE — Assessment & Plan Note (Signed)
Hypothyroidism, continue Levothyroxine 25mcg, TSH 2.56 06/24/17.

## 2017-09-09 NOTE — Assessment & Plan Note (Addendum)
HTN blood pressure, continue Metoprolol 100mg  qd, Amlodipine 5mg  qd.

## 2017-09-09 NOTE — Assessment & Plan Note (Signed)
Compensated clinically, trace edema BLE, continue Furosemide.

## 2017-09-09 NOTE — Assessment & Plan Note (Signed)
GERD stable, continue Ranitidine 150mg qd.   

## 2017-09-09 NOTE — Assessment & Plan Note (Signed)
Stable, continue Myrbetriq 50mg qd 

## 2017-09-09 NOTE — Patient Instructions (Signed)
Continue diet, exercise, safe gait. F/u in Clinic [facility] 6 months

## 2017-09-13 ENCOUNTER — Encounter: Payer: Self-pay | Admitting: Nurse Practitioner

## 2017-09-21 ENCOUNTER — Other Ambulatory Visit: Payer: Self-pay | Admitting: Physician Assistant

## 2017-09-21 ENCOUNTER — Other Ambulatory Visit: Payer: Self-pay | Admitting: Interventional Cardiology

## 2017-09-25 ENCOUNTER — Other Ambulatory Visit: Payer: Self-pay | Admitting: Physician Assistant

## 2017-09-28 ENCOUNTER — Other Ambulatory Visit: Payer: Self-pay | Admitting: Physician Assistant

## 2017-09-28 NOTE — Telephone Encounter (Signed)
Outpatient Medication Detail    Disp Refills Start End   RANEXA 1000 MG SR tablet 180 tablet 1 09/22/2017    Sig: TAKE 1 TABLET BY MOUTH TWICE DAILY.   Sent to pharmacy as: RANEXA 1000 MG SR tablet   E-Prescribing Status: Receipt confirmed by pharmacy (09/22/2017 10:02 AM EDT)   Pharmacy   [facility]

## 2017-11-10 ENCOUNTER — Encounter: Payer: Self-pay | Admitting: Cardiology

## 2017-11-10 ENCOUNTER — Ambulatory Visit (INDEPENDENT_AMBULATORY_CARE_PROVIDER_SITE_OTHER): Payer: Medicare Other | Admitting: Cardiology

## 2017-11-10 VITALS — BP 110/60 | HR 62 | Ht <= 58 in | Wt 202.0 lb

## 2017-11-10 DIAGNOSIS — I2510 Atherosclerotic heart disease of native coronary artery without angina pectoris: Secondary | ICD-10-CM | POA: Diagnosis not present

## 2017-11-10 NOTE — Progress Notes (Signed)
11/10/2017 Erin Ingram   May 15, 1940  [id number]  Primary Physician Mast, Man X, NP Primary Cardiologist: Dr. Irish Lack   Reason for Visit/CC: F/u for CAD  HPI:  Erin Ingram a 77 y.o.female, followed by Dr. Kevan Ny a history of CAD (CABG 2000, DES x2 to SVG-RCA), Low risk NST 03/2016, hypertension, hyperlipidemia, OSA on CPAP, chronic diastolic CHF, morbid obesity, polymyalgia rheumatica treated with prednisone, CKD III, left subclavian stenosis and prior h/oTIA.  She had repeat cardiac catheterization in 2008 that demonstrated patent grafts. She's had a history of persistent angina treated with EECP many years ago. She's been treated with Ranexa, metoprolol, amlodipine, isosorbide.   Admitted 9/28-9/29/16with worsening angina. LHC demonstrated 90% distal SVG-RCA stenosis. This was treated with 2 overlapping Synergy DES. SVG-OM2 was patent. There is a Y graft with a LIMA-LAD and SVG-diagonal. LIMA portion was patent but SVG-diagonal was occluded.  Readmitted 10/10-10/11/16 with back and right arm pain similar to previous angina. She had resolution with nitroglycerin. Cardiac enzymes remained normal. Relook cardiac catheterization demonstrated patent stents in the vein graft to the RCA and otherwise stable anatomy. Continued medical therapy was recommended.   At her last OV with Dr. Irish Lack 04/2017, she complained of exertional dyspnea but no chest pain and no other cardiac symptoms. Dr. Irish Lack felt that her dyspnea was more likely related to deconditioning and not cardiac related. Volume was also noted to be stable and pt euvolemic at that time. Her medical regimen was continued w/o any changes. She was instructed to return for 6 month f/u.   She returns today for repeat evaluation. BP is controlled at 110/60. HR 62. She reports full med compliance. No major changes since her last OV. She continues to have mild exertional dyspnea, but no change from baseline. She denies  resting dyspnea. No orthopnea or PND. Her weight is 7 lb lower than last OV, down at 202 lb (207 lb in Feb). She also denies CP and no left arm pain (presenting symptom prior to CABG).    Current Meds  Medication Sig  . alendronate (FOSAMAX) 70 MG tablet Take 70 mg by mouth once a week. Take with a full glass of water on an empty stomach.  Marland Kitchen amLODipine (NORVASC) 5 MG tablet TAKE 1 TABLET BY MOUTH DAILY.  Marland Kitchen atorvastatin (LIPITOR) 10 MG tablet TAKE 1 TABLET ONCE DAILY.  . Calcium Glycerophosphate (PRELIEF PO) Take 2 tablets by mouth as needed.   . Cholecalciferol (VITAMIN D PO) Take 2,000 Units by mouth daily.   . Cinnamon 500 MG TABS Take 1 each by mouth daily.  . clopidogrel (PLAVIX) 75 MG tablet TAKE 1 TABLET ONCE DAILY.  Marland Kitchen Coenzyme Q10 10 MG capsule Take 10 mg by mouth at bedtime.   . fluticasone (FLONASE) 50 MCG/ACT nasal spray Place 2 sprays into both nostrils daily as needed for allergies.   . furosemide (LASIX) 20 MG tablet Take 1 tablet (20 mg total) by mouth daily.  . Glucosamine-Chondroit-Vit C-Mn (GLUCOSAMINE 1500 COMPLEX) CAPS Take 2 capsules by mouth daily after breakfast.  . isosorbide mononitrate (IMDUR) 60 MG 24 hr tablet Take 60 mg by mouth daily.  Marland Kitchen levocetirizine (XYZAL) 5 MG tablet Take 5 mg by mouth every evening.  Marland Kitchen levothyroxine (SYNTHROID, LEVOTHROID) 75 MCG tablet Take 75 mcg by mouth daily before breakfast.   . metoprolol succinate (TOPROL-XL) 100 MG 24 hr tablet Take 1 tablet (100 mg total) by mouth daily. Please call and schedule August 2018 follow up. [phone] (Patient taking  differently: Take 100 mg by mouth at bedtime. Please call and schedule August 2018 follow up. [phone])  . mirabegron ER (MYRBETRIQ) 50 MG TB24 tablet Take 50 mg by mouth at bedtime.   . nitroGLYCERIN (NITROSTAT) 0.4 MG SL tablet Place 0.4 mg under the tongue every 5 (five) minutes as needed for chest pain. X 3 doses  . NONFORMULARY OR COMPOUNDED ITEM [facility]:  [place]  Tendonitis Cream - Diclofenac 3%, Baclofen 2%, Bupivacaine 1%, GAbapentin 6%, Ibuprofen 3%, Pentoxifylline 3%, apply 1-2 grams to affected area 3-4 times daily.  . predniSONE (DELTASONE) 5 MG tablet Take 7.5 mg by mouth daily. Take 1.5 tablet= 7.77m with food or milk by mouth once daily.  . Psyllium 400 MG CAPS Take 2,800 mg by mouth daily.   .Marland KitchenRANEXA 1000 MG SR tablet TAKE 1 TABLET BY MOUTH TWICE DAILY.  . ranitidine (ZANTAC) 150 MG capsule Take 150 mg by mouth 2 (two) times daily.  .Marland Kitchenvenlafaxine XR (EFFEXOR-XR) 75 MG 24 hr capsule Take 225 mg by mouth daily with breakfast. Patient takes 3 capsules  . [DISCONTINUED] aspirin 81 MG chewable tablet Chew 1 tablet (81 mg total) by mouth daily.  . [DISCONTINUED] Melatonin 1 MG TABS Take 1 tablet by mouth at bedtime as needed.   Allergies  Allergen Reactions  . Nsaids Other (See Comments)    Due to chronic kidney failure  . Butorphanol Other (See Comments)    agitation Constipation Produced a lot of urine, made patient feel crazy  . Sulfa Antibiotics Rash  . Bisoprolol Fumarate Other (See Comments)    Doesn't remember  . Butorphanol Tartrate Other (See Comments)    Produced a lot of urine, made patient feel crazy  . Codeine Nausea And Vomiting  . Demerol [Meperidine] Nausea And Vomiting  . Imipramine     Sweating, facial dysfunction   . Meperidine And Related Nausea And Vomiting  . Statins     MYALGIAS  . Tequin [Gatifloxacin] Other (See Comments)    Caused hypoglycemia Low blood sugar  . Brilinta [Ticagrelor] Rash    CAUSES PETECHIAE, PURPURA  . Pseudoephedrine Hcl Palpitations  . Septra [Sulfamethoxazole-Trimethoprim] Rash  . Sulfamethoxazole-Trimethoprim Rash   Past Medical History:  Diagnosis Date  . (HFpEF) heart failure with preserved ejection fraction (HCC)    [facility] Las[facility] started   . Anemia   . Arthritis    "fingers, back, shoulders, hips" (04/10/2016)  . Chronic diastolic CHF  (congestive heart failure) ([facility]   . CKD (chronic kidney disease), stage III ([facility] [place]   . Coronary artery disease    a. s/p CABG 2000 (SVG/ free LIMA Y graft to the diagonal and distal LAD, SVG to the OM 1, and SVG to the PDA) . b. Cath 12/19/2014 90% dSVG to RCA s/p 2 overlapping DES. c. LHC 2016, 2017 no intervention except diuresis needed. d. Low risk nuc 03/2016.  . Depression    with anxious component  . GERD (gastroesophageal reflux disease)   . History of hiatal hernia   . Hyperlipidemia   . Hypothyroidism   . Obesity   . OSA on CPAP   . PMR (polymyalgia rheumatica) (HCC)   . Pneumonia    "2-3 times" (04/10/2016)  . Stable angina (HCC)    microvascular, improved with Ranexa  . [place]avian artery stenosis ([facility]   . TIA (transient ischemic attack)    Family History  Problem Relation Age of Onset  . Heart disease  Mother   . Heart attack Mother   . Heart disease Father   . Heart attack Father   . Diabetes Brother   . Pulmonary embolism Brother   . Stroke Paternal Grandmother   . Hypertension Neg Hx    Past Surgical History:  Procedure Laterality Date  . BREAST BIOPSY Right 1964  . CARDIAC CATHETERIZATION  08   patent grafts, no culprit lesions, EF 65%  . CARDIAC CATHETERIZATION N/A 12/19/2014   Procedure: Left Heart Cath and Cors/Grafts Angiography;  Surgeon: Jettie Booze, MD;  Location: [place] [facility];  Service: Cardiovascular;  Laterality: N/A;  . CARDIAC CATHETERIZATION N/A 12/19/2014   Procedure: Coronary Stent Intervention;  Surgeon: Jettie Booze, MD;  Location: [place] [facility];  Service: Cardiovascular;  Laterality: N/A;  . CARDIAC CATHETERIZATION N/A 01/01/2015   Procedure: Left Heart Cath and Cors/Grafts Angiography;  Surgeon: Jettie Booze, MD;  Location: [place] [facility];  Service: Cardiovascular;  Laterality: N/A;  . CARDIAC CATHETERIZATION N/A 09/20/2015   Procedure: Left Heart Cath and Cors/Grafts Angiography;  Surgeon: Jettie Booze, MD;  Location: [place] [facility];  Service: Cardiovascular;  Laterality: N/A;  . CATARACT EXTRACTION W/ INTRAOCULAR LENS  IMPLANT, BILATERAL Bilateral 2011  . COLONOSCOPY WITH PROPOFOL N/A 07/27/2016   Procedure: COLONOSCOPY WITH PROPOFOL;  Surgeon: Wilford Corner, MD;  Location: [place];  Service: Endoscopy;  Laterality: N/A;  . CORONARY ARTERY BYPASS GRAFT  2000   ASCVD, multivessel, S./P.  . DILATION AND CURETTAGE OF UTERUS  1980s  . ESOPHAGOGASTRODUODENOSCOPY (EGD) WITH PROPOFOL N/A 07/27/2016   Procedure: ESOPHAGOGASTRODUODENOSCOPY (EGD) WITH PROPOFOL;  Surgeon: Wilford Corner, MD;  Location: [place];  Service: Endoscopy;  Laterality: N/A;  . FRACTURE SURGERY    . PATELLA FRACTURE SURGERY Left 1993   Social History   Socioeconomic History  . Marital status: Married    Spouse name: Not on file  . Number of children: 2  . Years of education: College  . Highest education level: Not on file  Occupational History  . Occupation: Retired Cytogeneticist  Social Needs  . Financial resource strain: Not on file  . Food insecurity:    Worry: Not on file    Inability: Not on file  . Transportation needs:    Medical: Not on file    Non-medical: Not on file  Tobacco Use  . Smoking status: Former Smoker    Packs/day: 0.50    Years: 7.00    Pack years: 3.50    Types: Cigarettes    Last attempt to quit: 08/21/1969    Years since quitting: 48.2  . Smokeless tobacco: Never Used  Substance and Sexual Activity  . Alcohol use: No    Alcohol/week: 0.0 standard drinks    Frequency: Never    Comment: rare  . Drug use: No  . Sexual activity: Never  Lifestyle  . Physical activity:    Days per week: Not on file    Minutes per session: Not on file  . Stress: Not on file  Relationships  . Social connections:    Talks on phone: Not on file    Gets together: Not on file    Attends religious service: Not on file    Active member of club or organization: Not on file      Attends meetings of clubs or organizations: Not on file    Relationship status: Not on file  . Intimate partner violence:    Fear of  current or ex partner: Not on file    Emotionally abused: Not on file    Physically abused: Not on file    Forced sexual activity: Not on file  Other Topics Concern  . Not on file  Social History Narrative   Social History     Social History Narrative       Lives in [place] with husband.   Diet: Regular   Do you drink/eat things with caffeine? 1 cup caffeine per day.  Not much coffee   Marital status: Married                           What year were you married? 1964   Do you live in a house, apartment, assisted living, condo, trailer, etc)? Here at [facility]   Is it one or more stories?   How many persons live in your home? 2   Do you have any pets in your home? No   Current or past profession: Engineer, production (RN)   Do you exercise? I did                                                Type & how often: Swimming, Tai Chi, exercise classes   Do you have a living will? yes   Do you have a DNR Form? Yes   Do you have a POA/HPOA forms?               Review of Systems: General: negative for chills, fever, night sweats or weight changes.  Cardiovascular: negative for chest pain, dyspnea on exertion, edema, orthopnea, palpitations, paroxysmal nocturnal dyspnea or shortness of breath Dermatological: negative for rash Respiratory: negative for cough or wheezing Urologic: negative for hematuria Abdominal: negative for nausea, vomiting, diarrhea, bright red blood per rectum, melena, or hematemesis Neurologic: negative for visual changes, syncope, or dizziness All other systems reviewed and are otherwise negative except as noted above.   Physical Exam:  Height _0  (1.473 m), weight 202 lb (91.6 kg).  General appearance: alert, cooperative, no distress and moderately obese Neck: no carotid bruit and no JVD Lungs: clear to auscultation  bilaterally Heart: regular rate and rhythm, S1, S2 normal, no murmur, click, rub or gallop Extremities: extremities normal, atraumatic, no cyanosis or edema Pulses: 2+ and symmetric Skin: Skin color, texture, turgor normal. No rashes or lesions Neurologic: Grossly normal  EKG not performed -- personally reviewed   ASSESSMENT AND PLAN:   1. CAD: s/p CABG 2000, DES x2 to SVG-RCA in 2016, Low risk NST 03/2016. No anginal symptoms. Continue medical therapy. Pt stopped ASA due to frequent bruising but continues on Plavix. Also on statin, BB, CCB, LA nitrate and Ranexa.   2. HTN: controlled on current regimen.   3. HLD: controlled. Recent lipid panel 06/2017 showed LDL at 71 mg/dL. Continue statin therapy w/ Lipitor.   4. OSA: on CPAP.   5. Chronic Diastolic HF: euvolemic on exam. Chronic stable dyspnea with certain activities (baseline and likely 2/2 deconditioning). No resting dyspnea, orthopnea, PND or LEE. Lungs are CTAB and BP and HR both well controlled. She is on daily low dose lasix and BB. Pt advised to monitor weight daily given regular prednisone use.   6. Polymyalgia Rheumatica: treated with prednisone. Pt advised to monitor weight closely  given potential for fluid retention.   Follow-Up w/ Dr. Irish Lack in 6 months.   Erin Ingram, MHS [facility] 11/10/2017 10:30 AM

## 2017-11-10 NOTE — Patient Instructions (Signed)
Medication Instructions:  1. Your physician recommends that you continue on your current medications as directed. Please refer to the Current Medication list given to you today.   Labwork: NONE ORDERED TODAY  Testing/Procedures: NONE ORDERED TODAY  Follow-Up: DR. VARANASI IN 6 MONTHS   Any Other Special Instructions Will Be Listed Below (If Applicable).     If you need a refill on your cardiac medications before your next appointment, please call your pharmacy.

## 2017-12-07 ENCOUNTER — Non-Acute Institutional Stay: Payer: Medicare Other

## 2017-12-07 VITALS — BP 122/64 | HR 60 | Temp 98.1°F | Ht <= 58 in | Wt 209.0 lb

## 2017-12-07 DIAGNOSIS — Z0000 Encounter for general adult medical examination without abnormal findings: Secondary | ICD-10-CM | POA: Diagnosis not present

## 2017-12-07 MED ORDER — ZOSTER VAC RECOMB ADJUVANTED 50 MCG/0.5ML IM SUSR: 0.5000 mL | Freq: Once | INTRAMUSCULAR | 1 refills | Status: AC

## 2017-12-07 NOTE — Progress Notes (Signed)
Subjective:   Erin Ingram is a 77 y.o. female who presents for Medicare Annual (Subsequent) preventive examination at [facility]   Last AWV-10/21/2016    Objective:     Vitals: BP 122/64 (BP Location: Left Arm, Patient Position: Sitting)   Pulse 60   Temp 98.1 F (36.7 C) (Oral)   Ht _0  (1.473 m)   Wt 209 lb (94.8 kg)   SpO2 95%   BMI 43.68 kg/m   Body mass index is 43.68 kg/m.  Advanced Directives 12/07/2017 04/04/2017 01/14/2017 08/26/2016 07/27/2016 04/10/2016 04/10/2016  Does Patient Have a Medical Advance Directive? Yes No;Yes No Yes Yes Yes No  Type of Advance Directive Out of facility DNR (pink MOST or yellow form) Healthcare Power of [place] will [place];Living will [place];Living will -  Does patient want to make changes to medical advance directive? No - Patient declined - - - - No - Patient declined -  Copy of [place] in Chart? - No - copy requested - No - copy requested No - copy requested No - copy requested -  Would patient like information on creating a medical advance directive? - Yes (ED - Information included in AVS) - - - - -  Pre-existing out of facility DNR order (yellow form or pink MOST form) Yellow form placed in chart (order not valid for inpatient use) - - - - - -    Tobacco Social History   Tobacco Use  Smoking Status Former Smoker  . Packs/day: 0.50  . Years: 7.00  . Pack years: 3.50  . Types: Cigarettes  . Last attempt to quit: 08/21/1969  . Years since quitting: 48.3  Smokeless Tobacco Never Used     Counseling given: Not Answered   Clinical Intake:  Pre-visit preparation completed: No  Pain : No/denies pain     Nutritional Risks: None Diabetes: No  How often do you need to have someone help you when you read instructions, pamphlets, or other written materials from your doctor or pharmacy?: 1 - Never What is the last grade  level you completed in school?: college  Interpreter Needed?: No  Information entered by :: Tyson Dense, RN  Past Medical History:  Diagnosis Date  . (HFpEF) heart failure with preserved ejection fraction (HCC)    a. normal EF, LVEDP at [facility] in 6/17 32 >> Lasix started   . Anemia   . Arthritis    "fingers, back, shoulders, hips" (04/10/2016)  . Chronic diastolic CHF (congestive heart failure) ([place])   . CKD (chronic kidney disease), stage III ([place])   . Coronary artery disease    a. s/p CABG 2000 (SVG/ free LIMA Y graft to the diagonal and distal LAD, SVG to the OM 1, and SVG to the PDA) . b. Cath 12/19/2014 90% dSVG to RCA s/p 2 overlapping DES. c. LHC 2016, 2017 no intervention except diuresis needed. d. Low risk nuc 03/2016.  . Depression    with anxious component  . GERD (gastroesophageal reflux disease)   . History of hiatal hernia   . Hyperlipidemia   . Hypothyroidism   . Obesity   . OSA on CPAP   . PMR (polymyalgia rheumatica) (HCC)   . Pneumonia    "2-3 times" (04/10/2016)  . Stable angina (HCC)    microvascular, improved with Ranexa  . Subclavian artery stenosis ([place])   . TIA (transient ischemic attack)  Past Surgical History:  Procedure Laterality Date  . BREAST BIOPSY Right 1964  . CARDIAC CATHETERIZATION  08   patent grafts, no culprit lesions, EF 65%  . CARDIAC CATHETERIZATION N/A 12/19/2014   Procedure: Left Heart Cath and Cors/Grafts Angiography;  Surgeon: Jettie Booze, MD;  Location: [facility] [facility] [facility];  Service: Cardiovascular;  Laterality: N/A;  . CARDIAC CATHETERIZATION N/A 12/19/2014   Procedure: Coronary Stent Intervention;  Surgeon: Jettie Booze, MD;  Location: [facility];  Service: Cardiovascular;  Laterality: N/A;  . CARDIAC CATHETERIZATION N/A 01/01/2015   Procedure: Left Heart Cath and Cors/Grafts Angiography;  Surgeon: Jettie Booze, MD;  Location: [facility];  Service: Cardiovascular;  Laterality: N/A;  . CARDIAC  CATHETERIZATION N/A 09/20/2015   Procedure: Left Heart Cath and Cors/Grafts Angiography;  Surgeon: Jettie Booze, MD;  Location: [place] [facility];  Service: Cardiovascular;  Laterality: N/A;  . CATARACT EXTRACTION W/ INTRAOCULAR LENS  IMPLANT, BILATERAL Bilateral 2011  . COLONOSCOPY WITH PROPOFOL N/A 07/27/2016   Procedure: COLONOSCOPY WITH PROPOFOL;  Surgeon: Wilford Corner, MD;  Location: [place] [facility];  Service: Endoscopy;  Laterality: N/A;  . CORONARY ARTERY BYPASS GRAFT  2000   ASCVD, multivessel, S./P.  . DILATION AND CURETTAGE OF UTERUS  1980s  . ESOPHAGOGASTRODUODENOSCOPY (EGD) WITH PROPOFOL N/A 07/27/2016   Procedure: ESOPHAGOGASTRODUODENOSCOPY (EGD) WITH PROPOFOL;  Surgeon: Wilford Corner, MD;  Location: [facility];  Service: Endoscopy;  Laterality: N/A;  . FRACTURE SURGERY    . PATELLA FRACTURE SURGERY Left 1993   Family History  Problem Relation Age of Onset  . Heart disease Mother   . Heart attack Mother   . Heart disease Father   . Heart attack Father   . Diabetes Brother   . Pulmonary embolism Brother   . Stroke Paternal Grandmother   . Hypertension Neg Hx    Social History   Socioeconomic History  . Marital status: Married    Spouse name: Not on file  . Number of children: 2  . Years of education: College  . Highest education level: Not on file  Occupational History  . Occupation: Retired Cytogeneticist  Social Needs  . Financial resource strain: Not hard at all  . Food insecurity:    Worry: Never true    Inability: Never true  . Transportation needs:    Medical: No    Non-medical: No  Tobacco Use  . Smoking status: Former Smoker    Packs/day: 0.50    Years: 7.00    Pack years: 3.50    Types: Cigarettes    Last attempt to quit: 08/21/1969    Years since quitting: 48.3  . Smokeless tobacco: Never Used  Substance and Sexual Activity  . Alcohol use: No    Alcohol/week: 0.0 standard drinks    Frequency: Never    Comment: rare  . Drug use:  No  . Sexual activity: Never  Lifestyle  . Physical activity:    Days per week: 0 days    Minutes per session: 0 min  . Stress: To some extent  Relationships  . Social connections:    Talks on phone: More than three times a week    Gets together: More than three times a week    Attends religious service: Never    Active member of club or organization: No    Attends meetings of clubs or organizations: Never    Relationship status: Married  Other Topics Concern  . Not on  file  Social History Narrative   Social History     Social History Narrative       Lives in [place] with husband.   Diet: Regular   Do you drink/eat things with caffeine? 1 cup caffeine per day.  Not much coffee   Marital status: Married                           What year were you married? 1964   Do you live in a house, apartment, assisted living, condo, trailer, etc)? Here at [facility]   Is it one or more stories?   How many persons live in your home? 2   Do you have any pets in your home? No   Current or past profession: Engineer, production (RN)   Do you exercise? I did                                                Type & how often: Swimming, Tai Chi, exercise classes   Do you have a living will? yes   Do you have a DNR Form? Yes   Do you have a POA/HPOA forms?              Outpatient Encounter Medications as of 12/07/2017  Medication Sig  . alendronate (FOSAMAX) 70 MG tablet Take 70 mg by mouth once a week. Take with a full glass of water on an empty stomach.  Marland Kitchen amLODipine (NORVASC) 5 MG tablet TAKE 1 TABLET BY MOUTH DAILY.  Marland Kitchen atorvastatin (LIPITOR) 10 MG tablet TAKE 1 TABLET ONCE DAILY.  . Calcium Glycerophosphate (PRELIEF PO) Take 2 tablets by mouth as needed.   . Cholecalciferol (VITAMIN D PO) Take 2,000 Units by mouth daily.   . Cinnamon 500 MG TABS Take 1 each by mouth daily.  . clopidogrel (PLAVIX) 75 MG tablet TAKE 1 TABLET ONCE DAILY.  Marland Kitchen Coenzyme Q10 10 MG capsule Take 10 mg by mouth at  bedtime.   . fluticasone (FLONASE) 50 MCG/ACT nasal spray Place 2 sprays into both nostrils daily as needed for allergies.   . furosemide (LASIX) 20 MG tablet Take 1 tablet (20 mg total) by mouth daily.  . Glucosamine-Chondroit-Vit C-Mn (GLUCOSAMINE 1500 COMPLEX) CAPS Take 2 capsules by mouth daily after breakfast.  . isosorbide mononitrate (IMDUR) 60 MG 24 hr tablet Take 60 mg by mouth daily.  Marland Kitchen levocetirizine (XYZAL) 5 MG tablet Take 5 mg by mouth every evening.  Marland Kitchen levothyroxine (SYNTHROID, LEVOTHROID) 75 MCG tablet Take 75 mcg by mouth daily before breakfast.   . metoprolol succinate (TOPROL-XL) 100 MG 24 hr tablet Take 1 tablet (100 mg total) by mouth daily. Please call and schedule August 2018 follow up. [phone] (Patient taking differently: Take 100 mg by mouth at bedtime. Please call and schedule August 2018 follow up. [phone])  . mirabegron ER (MYRBETRIQ) 50 MG TB24 tablet Take 50 mg by mouth at bedtime.   . nitroGLYCERIN (NITROSTAT) 0.4 MG SL tablet Place 0.4 mg under the tongue every 5 (five) minutes as needed for chest pain. X 3 doses  . NONFORMULARY OR COMPOUNDED ITEM [facility]:  [place] Tendonitis Cream - Diclofenac 3%, Baclofen 2%, Bupivacaine 1%, GAbapentin 6%, Ibuprofen 3%, Pentoxifylline 3%, apply 1-2 grams to affected area 3-4 times daily.  . predniSONE (DELTASONE) 5 MG  tablet Take 7.5 mg by mouth daily. Take 1.5 tablet= 7.31m with food or milk by mouth once daily.  . Psyllium 400 MG CAPS Take 2,800 mg by mouth daily.   .Marland KitchenRANEXA 1000 MG SR tablet TAKE 1 TABLET BY MOUTH TWICE DAILY.  . ranitidine (ZANTAC) 150 MG capsule Take 150 mg by mouth 2 (two) times daily.  .Marland Kitchenvenlafaxine XR (EFFEXOR-XR) 75 MG 24 hr capsule Take 225 mg by mouth daily with breakfast. Patient takes 3 capsules   No facility-administered encounter medications on file as of 12/07/2017.     Activities of Daily Living In your present state of health, do you have any difficulty performing the  following activities: 12/07/2017  Hearing? N  Vision? N  Difficulty concentrating or making decisions? Y  Walking or climbing stairs? Y  Dressing or bathing? N  Doing errands, shopping? N  Preparing Food and eating ? N  Using the Toilet? N  In the past six months, have you accidently leaked urine? Y  Do you have problems with loss of bowel control? Y  Managing your Medications? N  Managing your Finances? N  Housekeeping or managing your Housekeeping? N  Some recent data might be hidden    Patient Care Team: Mast, Man X, NP as PCP - General (Internal Medicine) VJettie Booze MD as PCP - Cardiology (Cardiology) SWilford Corner MD as Consulting Physician (Gastroenterology) DMarlou Sa GTonna Corner MD as Consulting Physician (Orthopedic Surgery) YMarcial Pacas MD as Consulting Physician (Neurology) DFranchot Gallo MD as Consulting Physician (Urology) CKary Kos MD as Consulting Physician (Neurosurgery)    Assessment:   This is a routine wellness examination for [place]  Exercise Activities and Dietary recommendations Current Exercise Habits: The patient does not participate in regular exercise at present, Exercise limited by: orthopedic condition(s)  Goals   None     Fall Risk Fall Risk  12/07/2017  Falls in the past year? Yes  Number falls in past yr: 2 or more  Injury with Fall? No   Is the patient's home free of loose throw rugs in walkways, pet beds, electrical cords, etc?   yes      Grab bars in the bathroom? yes      Handrails on the stairs?   yes      Adequate lighting?   yes  Timed Get Up and Go performed: 13 seconds  Depression Screen PHQ 2/9 Scores 12/07/2017 03/22/2015 02/25/2015  PHQ - 2 Score 2 0 1  PHQ- 9 Score 12 - -     Cognitive Function MMSE - Mini Mental State Exam 06/15/2017 06/10/2017  Orientation to time 5 5  Orientation to Place 5 5  Registration 3 3  Attention/ Calculation 5 5  Recall 3 3  Language- name 2 objects 2 2  Language-  repeat 1 1  Language- follow 3 step command 3 3  Language- read & follow direction 1 1  Write a sentence 1 1  Copy design 1 1  Total score 30 30        Immunization History  Administered Date(s) Administered  . HPV Bivalent 11/24/2013, 01/17/2016  . Influenza-Unspecified 01/10/2013, 12/27/2014, 12/30/2016  . Pneumococcal Conjugate-13 04/21/2013  . Pneumococcal Polysaccharide-23 02/27/1993, 03/23/2009  . Tdap 04/20/2012  . Zoster 11/10/2010    Qualifies for Shingles Vaccine? Yes, educated and ordered to pharmacy  Screening Tests Health Maintenance  Topic Date Due  . INFLUENZA VACCINE  10/21/2017  . TETANUS/TDAP  04/20/2022  . DEXA SCAN  Completed  .  PNA vac Low Risk Adult  Completed    Cancer Screenings: Lung: Low Dose CT Chest recommended if Age 66-80 years, 30 pack-year currently smoking OR have quit w/in 15years. Patient does not qualify. Breast:  Up to date on Mammogram? Yes   Up to date of Bone Density/Dexa? Yes Colorectal: up to date  Additional Screenings:  Hepatitis C Screening: declined Flu vaccine due: will receive at [place]:    I have personally reviewed and addressed the Medicare Annual Wellness questionnaire and have noted the following in the patient's chart:  A. Medical and social history B. Use of alcohol, tobacco or illicit drugs  C. Current medications and supplements D. Functional ability and status E.  Nutritional status F.  Physical activity G. Advance directives H. List of other physicians I.  Hospitalizations, surgeries, and ER visits in previous 12 months J.  [place] to include hearing, vision, cognitive, depression L. Referrals and appointments - none  In addition, I have reviewed and discussed with patient certain preventive protocols, quality metrics, and best practice recommendations. A written personalized care plan for preventive services as well as general preventive health recommendations were provided to  patient.  See attached scanned questionnaire for additional information.   Signed,   Tyson Dense, RN Nurse Health Advisor  Patient Concerns: None

## 2017-12-07 NOTE — Patient Instructions (Addendum)
Erin Ingram , Thank you for taking time to come for your Medicare Wellness Visit. I appreciate your ongoing commitment to your health goals. Please review the following plan we discussed and let me know if I can assist you in the future.   Screening recommendations/referrals: Colonoscopy excluded, over age 78 Mammogram excluded, over age 65 Bone Density up to date Recommended yearly ophthalmology/optometry visit for glaucoma screening and checkup Recommended yearly dental visit for hygiene and checkup  Vaccinations: Influenza vaccine due Pneumococcal vaccine up to date and completed  Tdap vaccine up to date, due 04/20/2022 Shingles vaccine due, ordered to [facility]    Advanced directives: in chart  Conditions/risks identified: none  Next appointment: Mast NP 03/10/2018 @ 1:30pm   Preventive Care 39 Years and Older, Female Preventive care refers to lifestyle choices and visits with your health care provider that can promote health and wellness. What does preventive care include?  A yearly physical exam. This is also called an annual well check.  Dental exams once or twice a year.  Routine eye exams. Ask your health care provider how often you should have your eyes checked.  Personal lifestyle choices, including:  Daily care of your teeth and gums.  Regular physical activity.  Eating a healthy diet.  Avoiding tobacco and drug use.  Limiting alcohol use.  Practicing safe sex.  Taking low-dose aspirin every day.  Taking vitamin and mineral supplements as recommended by your health care provider. What happens during an annual well check? The services and screenings done by your health care provider during your annual well check will depend on your age, overall health, lifestyle risk factors, and family history of disease. Counseling  Your health care provider may ask you questions about your:  Alcohol use.  Tobacco use.  Drug use.  Emotional well-being.  Home and  relationship well-being.  Sexual activity.  Eating habits.  History of falls.  Memory and ability to understand (cognition).  Work and work Statistician.  Reproductive health. Screening  You may have the following tests or measurements:  Height, weight, and BMI.  Blood pressure.  Lipid and cholesterol levels. These may be checked every 5 years, or more frequently if you are over 39 years old.  Skin check.  Lung cancer screening. You may have this screening every year starting at age 84 if you have a 30-pack-year history of smoking and currently smoke or have quit within the past 15 years.  Fecal occult blood test (FOBT) of the stool. You may have this test every year starting at age 29.  Flexible sigmoidoscopy or colonoscopy. You may have a sigmoidoscopy every 5 years or a colonoscopy every 10 years starting at age 78.  Hepatitis C blood test.  Hepatitis B blood test.  Sexually transmitted disease (STD) testing.  Diabetes screening. This is done by checking your blood sugar (glucose) after you have not eaten for a while (fasting). You may have this done every 1-3 years.  Bone density scan. This is done to screen for osteoporosis. You may have this done starting at age 9.  Mammogram. This may be done every 1-2 years. Talk to your health care provider about how often you should have regular mammograms. Talk with your health care provider about your test results, treatment options, and if necessary, the need for more tests. Vaccines  Your health care provider may recommend certain vaccines, such as:  Influenza vaccine. This is recommended every year.  Tetanus, diphtheria, and acellular pertussis (Tdap, Td) vaccine. You  may need a Td booster every 10 years.  Zoster vaccine. You may need this after age 50.  Pneumococcal 13-valent conjugate (PCV13) vaccine. One dose is recommended after age 56.  Pneumococcal polysaccharide (PPSV23) vaccine. One dose is recommended after  age 33. Talk to your health care provider about which screenings and vaccines you need and how often you need them. This information is not intended to replace advice given to you by your health care provider. Make sure you discuss any questions you have with your health care provider. Document Released: 04/05/2015 Document Revised: 11/27/2015 Document Reviewed: 01/08/2015 Elsevier Interactive Patient Education  2017 [place] Prevention in the Home Falls can cause injuries. They can happen to people of all ages. There are many things you can do to make your home safe and to help prevent falls. What can I do on the outside of my home?  Regularly fix the edges of walkways and driveways and fix any cracks.  Remove anything that might make you trip as you walk through a door, such as a raised step or threshold.  Trim any bushes or trees on the path to your home.  Use bright outdoor lighting.  Clear any walking paths of anything that might make someone trip, such as rocks or tools.  Regularly check to see if handrails are loose or broken. Make sure that both sides of any steps have handrails.  Any raised decks and porches should have guardrails on the edges.  Have any leaves, snow, or ice cleared regularly.  Use sand or salt on walking paths during winter.  Clean up any spills in your garage right away. This includes oil or grease spills. What can I do in the bathroom?  Use night lights.  Install grab bars by the toilet and in the tub and shower. Do not use towel bars as grab bars.  Use non-skid mats or decals in the tub or shower.  If you need to sit down in the shower, use a plastic, non-slip stool.  Keep the floor dry. Clean up any water that spills on the floor as soon as it happens.  Remove soap buildup in the tub or shower regularly.  Attach bath mats securely with double-sided non-slip rug tape.  Do not have throw rugs and other things on the floor that can  make you trip. What can I do in the bedroom?  Use night lights.  Make sure that you have a light by your bed that is easy to reach.  Do not use any sheets or blankets that are too big for your bed. They should not hang down onto the floor.  Have a firm chair that has side arms. You can use this for support while you get dressed.  Do not have throw rugs and other things on the floor that can make you trip. What can I do in the kitchen?  Clean up any spills right away.  Avoid walking on wet floors.  Keep items that you use a lot in easy-to-reach places.  If you need to reach something above you, use a strong step stool that has a grab bar.  Keep electrical cords out of the way.  Do not use floor polish or wax that makes floors slippery. If you must use wax, use non-skid floor wax.  Do not have throw rugs and other things on the floor that can make you trip. What can I do with my stairs?  Do not leave any items  on the stairs.  Make sure that there are handrails on both sides of the stairs and use them. Fix handrails that are broken or loose. Make sure that handrails are as long as the stairways.  Check any carpeting to make sure that it is firmly attached to the stairs. Fix any carpet that is loose or worn.  Avoid having throw rugs at the top or bottom of the stairs. If you do have throw rugs, attach them to the floor with carpet tape.  Make sure that you have a light switch at the top of the stairs and the bottom of the stairs. If you do not have them, ask someone to add them for you. What else can I do to help prevent falls?  Wear shoes that:  Do not have high heels.  Have rubber bottoms.  Are comfortable and fit you well.  Are closed at the toe. Do not wear sandals.  If you use a stepladder:  Make sure that it is fully opened. Do not climb a closed stepladder.  Make sure that both sides of the stepladder are locked into place.  Ask someone to hold it for you,  if possible.  Clearly mark and make sure that you can see:  Any grab bars or handrails.  First and last steps.  Where the edge of each step is.  Use tools that help you move around (mobility aids) if they are needed. These include:  Canes.  Walkers.  Scooters.  Crutches.  Turn on the lights when you go into a dark area. Replace any light bulbs as soon as they burn out.  Set up your furniture so you have a clear path. Avoid moving your furniture around.  If any of your floors are uneven, fix them.  If there are any pets around you, be aware of where they are.  Review your medicines with your doctor. Some medicines can make you feel dizzy. This can increase your chance of falling. Ask your doctor what other things that you can do to help prevent falls. This information is not intended to replace advice given to you by your health care provider. Make sure you discuss any questions you have with your health care provider. Document Released: 01/03/2009 Document Revised: 08/15/2015 Document Reviewed: 04/13/2014 Elsevier Interactive Patient Education  2017 [employer].

## 2017-12-16 ENCOUNTER — Ambulatory Visit (INDEPENDENT_AMBULATORY_CARE_PROVIDER_SITE_OTHER): Payer: Medicare Other | Admitting: Neurology

## 2017-12-16 ENCOUNTER — Encounter: Payer: Self-pay | Admitting: Neurology

## 2017-12-16 VITALS — BP 132/70 | HR 70 | Ht <= 58 in | Wt 199.5 lb

## 2017-12-16 DIAGNOSIS — R269 Unspecified abnormalities of gait and mobility: Secondary | ICD-10-CM

## 2017-12-16 DIAGNOSIS — R441 Visual hallucinations: Secondary | ICD-10-CM

## 2017-12-16 DIAGNOSIS — R251 Tremor, unspecified: Secondary | ICD-10-CM

## 2017-12-16 NOTE — Progress Notes (Signed)
PATIENT: Erin Ingram DOB: 1941/02/11  Chief Complaint  Patient presents with  . Gait Abnormality    She never went to PT. She feels her gait has improved.  She is walking without assistance.  . Visual Hallucinations    She was last here in 05/2017.  Reports two episodes since last seen.  Both episodes occurred while she was in bed.  In [place], she briefly saw a woman standing in the corner.  In August, she saw a woman holding her bathing suit strap and she said "I've got to go".  She got up out of the bed to help her and then realized no one was there.  No issues with memory.  Last MMSE in 05/2017 was 30/30.  . Tremors    The full body tremors she was experiencing have resolved.     HISTORICAL  Erin Ingram is a 77 year old right-handed female, seen in refer by her primary care physician Dr. Hulan Fess for evaluation of possible TIA, initial evaluation was January 23 2016,  She and her husband lives at independent living at [facility], had a history of coronary artery disease, hypertension, hyperlipidemia, multivessel CABG in March 2000, obstructive sleep apnea, on CPAP machine, depression with anxiety, polymyalgia rheumatica was diagnosed in December 2014, on chronic prednisone treatment, variable dose over the years, hypothyroidism, chronic kidney disease, anemia of chronic kidney disease, allergic to multiple medications, including statins cause muscle achy pain, she use walker intermittently due to leg pain and weakness. For polymyalgia rheumatica, she has been treated with low-dose prednisone 10 mg for many years, once she is tapering down the medication she had recurrent symptoms.  She presented to the emergency room on January 07 2016, it was her routine to play cards with her friend every Tuesday afternoon, around 2:15, after lunch, she was sitting playing with her friend, she noticed mild frontal headaches, dark vision in the center of her visual field, with lattice bright  light shining through, she noticed mild difficulty shuffling her card, symptoms last about 30 minutes, she denies confusion, she was able to finish playing card, won the game. She was later taken to the emergency room for evaluation, she is already on aspirin and Plavix, she was diagnosed with probable migraine headache vs TIA  I personally reviewed MRI of the brain without contrast, generalized atrophy, mild periventricular small vessel disease, no acute abnormalities,    Echocardiogram in October 2016: Left ventricle:  The cavity size was normal. There was moderate concentric hypertrophy. Systolic function was normal. The estimated ejection fraction was in the range of 55% to 60%. Wall motion was Normal; Cardio catheter in June 2017, severe three-vessel coronary artery disease, elevated left ventricular diastolic pressure 33 mmHg, she was given Lasix to help reduce intravascular volume  Labs: negative troponin times 3, CMP showed elevated creatinine 1.26, glucose 146,CBC showed hemoglobin 10 point 8, June 2017 normal TSH, A1c 5.6, total cholesterol 163, LDL 66,  Ultrasound of carotid artery showed moderate heterogeneous and calcified plaque, but no hemodynamic significant stenosis.  Update June 15, 2017: Last visit was in November 2017,  She came in with a long list of new complaints, last few months, she noticed twitching of fingers, hands, arms, legs, mouth, face, eyes, usually when lying down before sleep, this has been ongoing for 6 months, intermittent, but gradually getting worse, each episode lasted for few seconds.  Visual hallucination since Jan 2019, now it has stopped, it happened before she goes to  sleep, she relaized it is not real, lasting for few seconds  She is very sedentary, use walkers more, worsening mild memory loss,  MRI of lumbar in January 2019, multilevel degenerative disc disease, moderate left subarticular and foraminal stenosis at L4-5, secondary to a left 4 disc  protrusion and asymmetric left-sided facet hypertrophy, results in obvious is slightly impacted left L4, L5 nerve roots,  MRI of the brain in January 2019 generalized atrophy, ventriculomegaly, periventricular white matter disease, cervical degenerative changes.  UPDATE Sept 26 2019: She still plays card twice a week, each lasted about 2 hours, she does not have significant low back pain. She has occasionally hallucination, it happened early morning.  She sleeps well, denies significant pain.  Mild unsteady gait, has trouble sleeping sometimes, while the other times she tends to over sleep,  REVIEW OF SYSTEMS: Full 14 system review of systems performed and notable only for as above.  ALLERGIES: Allergies  Allergen Reactions  . Nsaids Other (See Comments)    Due to chronic kidney failure  . Butorphanol Other (See Comments)    agitation Constipation Produced a lot of urine, made patient feel crazy  . Sulfa Antibiotics Rash  . Bisoprolol Fumarate Other (See Comments)    Doesn't remember  . Butorphanol Tartrate Other (See Comments)    Produced a lot of urine, made patient feel crazy  . Codeine Nausea And Vomiting  . Demerol [Meperidine] Nausea And Vomiting  . Imipramine     Sweating, facial dysfunction   . Meperidine And Related Nausea And Vomiting  . Statins     MYALGIAS  . Tequin [Gatifloxacin] Other (See Comments)    Caused hypoglycemia Low blood sugar  . Brilinta [Ticagrelor] Rash    CAUSES PETECHIAE, PURPURA  . Pseudoephedrine Hcl Palpitations  . Septra [Sulfamethoxazole-Trimethoprim] Rash  . Sulfamethoxazole-Trimethoprim Rash    HOME MEDICATIONS: Current Outpatient Medications  Medication Sig Dispense Refill  . alendronate (FOSAMAX) 70 MG tablet Take 70 mg by mouth once a week. Take with a full glass of water on an empty stomach.    Marland Kitchen amLODipine (NORVASC) 5 MG tablet TAKE 1 TABLET BY MOUTH DAILY. 90 tablet 2  . atorvastatin (LIPITOR) 10 MG tablet TAKE 1 TABLET ONCE  DAILY. 90 tablet 2  . Calcium Glycerophosphate (PRELIEF PO) Take 2 tablets by mouth as needed.     . Cholecalciferol (VITAMIN D PO) Take 2,000 Units by mouth daily.     . Cinnamon 500 MG TABS Take 1 each by mouth daily.    . clopidogrel (PLAVIX) 75 MG tablet TAKE 1 TABLET ONCE DAILY. 90 tablet 2  . Coenzyme Q10 10 MG capsule Take 10 mg by mouth at bedtime.     . fluticasone (FLONASE) 50 MCG/ACT nasal spray Place 2 sprays into both nostrils daily as needed for allergies.     . furosemide (LASIX) 20 MG tablet Take 1 tablet (20 mg total) by mouth daily. 90 tablet 3  . Glucosamine-Chondroit-Vit C-Mn (GLUCOSAMINE 1500 COMPLEX) CAPS Take 2 capsules by mouth daily after breakfast.    . isosorbide mononitrate (IMDUR) 60 MG 24 hr tablet Take 60 mg by mouth daily.    Marland Kitchen levocetirizine (XYZAL) 5 MG tablet Take 5 mg by mouth every evening.    Marland Kitchen levothyroxine (SYNTHROID, LEVOTHROID) 75 MCG tablet Take 75 mcg by mouth daily before breakfast.     . metoprolol succinate (TOPROL-XL) 100 MG 24 hr tablet Take 1 tablet (100 mg total) by mouth daily. Please call and schedule August  2018 follow up. [phone] (Patient taking differently: Take 100 mg by mouth at bedtime. Please call and schedule August 2018 follow up. [phone]) 90 tablet 3  . mirabegron ER (MYRBETRIQ) 50 MG TB24 tablet Take 50 mg by mouth at bedtime.     . nitroGLYCERIN (NITROSTAT) 0.4 MG SL tablet Place 0.4 mg under the tongue every 5 (five) minutes as needed for chest pain. X 3 doses    . NONFORMULARY OR COMPOUNDED ITEM [facility]:  Achilles Tendonitis Cream - Diclofenac 3%, Baclofen 2%, Bupivacaine 1%, GAbapentin 6%, Ibuprofen 3%, Pentoxifylline 3%, apply 1-2 grams to affected area 3-4 times daily. 120 each 2  . predniSONE (DELTASONE) 5 MG tablet Take 7.5 mg by mouth daily. Take 1.5 tablet= 7.56m with food or milk by mouth once daily.    . Psyllium 400 MG CAPS Take 2,800 mg by mouth daily.     .Marland KitchenRANEXA 1000 MG SR tablet TAKE 1 TABLET BY  MOUTH TWICE DAILY. 180 tablet 1  . ranitidine (ZANTAC) 150 MG capsule Take 150 mg by mouth 2 (two) times daily.    .Marland Kitchenvenlafaxine XR (EFFEXOR-XR) 75 MG 24 hr capsule Take 225 mg by mouth daily with breakfast. Patient takes 3 capsules     No current facility-administered medications for this visit.     PAST MEDICAL HISTORY: Past Medical History:  Diagnosis Date  . (HFpEF) heart failure with preserved ejection fraction (HCC)    [facility] >> Lasix started   . Anemia   . Arthritis    "fingers, back, shoulders, hips" (04/10/2016)  . Chronic diastolic CHF (congestive heart failure) ([place]   . CKD (chronic kidney disease), stage III ([facility]   . Coronary artery disease    a. s/p CABG 2000 (SVG/ free LIMA Y graft to the diagonal and distal LAD, SVG to the OM 1, and SVG to the PDA) . b. Cath 12/19/2014 90% dSVG to RCA s/p 2 overlapping DES. c. LHC 2016, 2017 no intervention except diuresis needed. d. Low risk nuc 03/2016.  . Depression    with anxious component  . GERD (gastroesophageal reflux disease)   . History of hiatal hernia   . Hyperlipidemia   . Hypothyroidism   . Obesity   . OSA on CPAP   . PMR (polymyalgia rheumatica) (HCC)   . Pneumonia    "2-3 times" (04/10/2016)  . Stable angina (HCC)    microvascular, improved with Ranexa  . Subclavian artery stenosis ([place]   . TIA (transient ischemic attack)     PAST SURGICAL HISTORY: Past Surgical History:  Procedure Laterality Date  . BREAST BIOPSY Right 1964  . CARDIAC CATHETERIZATION  08   patent grafts, no culprit lesions, EF 65%  . CARDIAC CATHETERIZATION N/A 12/19/2014   Procedure: Left Heart Cath and Cors/Grafts Angiography;  Surgeon: JJettie Booze MD;  [place] [facility];  Service: Cardiovascular;  Laterality: N/A;  . CARDIAC CATHETERIZATION N/A 12/19/2014   Procedure: Coronary Stent Intervention;  Surgeon: JJettie Booze MD;  [place];  Service: Cardiovascular;   Laterality: N/A;  . CARDIAC CATHETERIZATION N/A 01/01/2015   Procedure: Left Heart Cath and Cors/Grafts Angiography;  Surgeon: JJettie Booze MD;  [place] [facility];  Service: Cardiovascular;  Laterality: N/A;  . CARDIAC CATHETERIZATION N/A 09/20/2015   Procedure: Left Heart Cath and Cors/Grafts Angiography;  Surgeon: JJettie Booze MD;  [place];  Service: Cardiovascular;  Laterality:  N/A;  . CATARACT EXTRACTION W/ INTRAOCULAR LENS  IMPLANT, BILATERAL Bilateral 2011  . COLONOSCOPY WITH PROPOFOL N/A 07/27/2016   Procedure: COLONOSCOPY WITH PROPOFOL;  Surgeon: Wilford Corner, MD;  Location: [place];  Service: Endoscopy;  Laterality: N/A;  . CORONARY ARTERY BYPASS GRAFT  2000   ASCVD, multivessel, S./P.  . DILATION AND CURETTAGE OF UTERUS  1980s  . ESOPHAGOGASTRODUODENOSCOPY (EGD) WITH PROPOFOL N/A 07/27/2016   Procedure: ESOPHAGOGASTRODUODENOSCOPY (EGD) WITH PROPOFOL;  Surgeon: Wilford Corner, MD;  Location: [place];  Service: Endoscopy;  Laterality: N/A;  . FRACTURE SURGERY    . PATELLA FRACTURE SURGERY Left 1993    FAMILY HISTORY: Family History  Problem Relation Age of Onset  . Heart disease Mother   . Heart attack Mother   . Heart disease Father   . Heart attack Father   . Diabetes Brother   . Pulmonary embolism Brother   . Stroke Paternal Grandmother   . Hypertension Neg Hx     SOCIAL HISTORY:  Social History   Socioeconomic History  . Marital status: Married    Spouse name: Not on file  . Number of children: 2  . Years of education: College  . Highest education level: Not on file  Occupational History  . Occupation: Retired Cytogeneticist  Social Needs  . Financial resource strain: Not hard at all  . Food insecurity:    Worry: Never true    Inability: Never true  . Transportation needs:    Medical: No    Non-medical: No  Tobacco Use  . Smoking status: Former Smoker    Packs/day: 0.50    Years: 7.00     Pack years: 3.50    Types: Cigarettes    Last attempt to quit: 08/21/1969    Years since quitting: 48.3  . Smokeless tobacco: Never Used  Substance and Sexual Activity  . Alcohol use: No    Alcohol/week: 0.0 standard drinks    Frequency: Never    Comment: rare  . Drug use: No  . Sexual activity: Never  Lifestyle  . Physical activity:    Days per week: 0 days    Minutes per session: 0 min  . Stress: To some extent  Relationships  . Social connections:    Talks on phone: More than three times a week    Gets together: More than three times a week    Attends religious service: Never    Active member of club or organization: No    Attends meetings of clubs or organizations: Never    Relationship status: Married  . Intimate partner violence:    Fear of current or ex partner: No    Emotionally abused: No    Physically abused: No    Forced sexual activity: No  Other Topics Concern  . Not on file  Social History Narrative   Social History     Social History Narrative       Lives in [place] with husband.   Diet: Regular   Do you drink/eat things with caffeine? 1 cup caffeine per day.  Not much coffee   Marital status: Married                           What year were you married? 1964   Do you live in a house, apartment, assisted living, condo, trailer, etc)? Here at [facility]   Is it one or more stories?   How many persons  live in your home? 2   Do you have any pets in your home? No   Current or past profession: Engineer, production (RN)   Do you exercise? I did                                                Type & how often: Swimming, Tai Chi, exercise classes   Do you have a living will? yes   Do you have a DNR Form? Yes   Do you have a POA/HPOA forms?               PHYSICAL EXAM   Vitals:   12/16/17 1316  BP: 132/70  Pulse: 70  Weight: 199 lb 8 oz (90.5 kg)  Height: _0  (1.473 m)    Not recorded      Body mass index is 41.7 kg/m.  PHYSICAL  EXAMNIATION:  Gen: NAD, conversant, well nourised, obese, well groomed                     Cardiovascular: Regular rate rhythm, no peripheral edema, warm, nontender. Eyes: Conjunctivae clear without exudates or hemorrhage Neck: Supple, no carotid bruits. Pulmonary: Clear to auscultation bilaterally   NEUROLOGICAL EXAM: MMSE - Mini Mental State Exam 06/15/2017 06/10/2017  Orientation to time 5 5  Orientation to Place 5 5  Registration 3 3  Attention/ Calculation 5 5  Recall 3 3  Language- name 2 objects 2 2  Language- repeat 1 1  Language- follow 3 step command 3 3  Language- read & follow direction 1 1  Write a sentence 1 1  Copy design 1 1  Total score 30 30  Animal naming 9   CRANIAL NERVES: CN II: Visual fields are full to confrontation. Fundoscopic exam is normal with sharp discs and no vascular changes. Pupils are round equal and briskly reactive to light. CN III, IV, VI: extraocular movement are normal. No ptosis. CN V: Facial sensation is intact to pinprick in all 3 divisions bilaterally. Corneal responses are intact.  CN VII: Face is symmetric with normal eye closure and smile. CN VIII: Hearing is normal to rubbing fingers CN IX, X: Palate elevates symmetrically. Phonation is normal. CN XI: Head turning and shoulder shrug are intact CN XII: Tongue is midline with normal movements and no atrophy.  MOTOR: There is no pronator drift of out-stretched arms. Muscle bulk and tone are normal. Muscle strength is normal.  REFLEXES: Reflexes are 2+ and symmetric at the biceps, triceps, knees, and ankles. Plantar responses are flexor.  SENSORY: Intact to light touch, pinprick, positional sensation and vibratory sensation are intact in fingers and toes.  COORDINATION: Rapid alternating movements and fine finger movements are intact. There is no dysmetria on finger-to-nose and heel-knee-shin.    GAIT/STANCE: She has obesity, right plantar fasciitis, rely on her walker, mildly  unsteady, antalgic gait   DIAGNOSTIC DATA (LABS, IMAGING, TESTING) - I reviewed patient records, labs, notes, testing and imaging myself where available.   ASSESSMENT AND PLAN  Erin Ingram is a 77 y.o. female   Visual hallucinations  Transient, improved,  Could indicate underlying central nervous system degenerative disorder     Gait abnormality   multifactorial, this includes obesity, deconditioning, sedentary lifestyle  Continue moderate exercise  Intermittent tremor,  Most consistent with exaggerated physiological tremor  Marcial Pacas, M.D. Ph.D.  [facility] [address], [place], [place] [address] Ph: [phone] Fax: [phone]  CC: Hulan Fess, MD

## 2017-12-17 ENCOUNTER — Other Ambulatory Visit: Payer: Self-pay | Admitting: Interventional Cardiology

## 2017-12-17 ENCOUNTER — Other Ambulatory Visit: Payer: Self-pay | Admitting: Physician Assistant

## 2017-12-17 ENCOUNTER — Other Ambulatory Visit: Payer: Self-pay | Admitting: Cardiology

## 2018-01-11 ENCOUNTER — Encounter (HOSPITAL_COMMUNITY): Payer: Self-pay | Admitting: Emergency Medicine

## 2018-01-11 ENCOUNTER — Observation Stay (HOSPITAL_COMMUNITY)
Admission: EM | Admit: 2018-01-11 | Discharge: 2018-01-13 | Disposition: A | Discharge status: Home / Self Care | Payer: Medicare Other | Attending: Interventional Cardiology | Admitting: Interventional Cardiology

## 2018-01-11 ENCOUNTER — Emergency Department (HOSPITAL_COMMUNITY): Payer: Medicare Other

## 2018-01-11 ENCOUNTER — Other Ambulatory Visit: Payer: Self-pay

## 2018-01-11 DIAGNOSIS — Z951 Presence of aortocoronary bypass graft: Secondary | ICD-10-CM | POA: Insufficient documentation

## 2018-01-11 DIAGNOSIS — I771 Stricture of artery: Secondary | ICD-10-CM | POA: Insufficient documentation

## 2018-01-11 DIAGNOSIS — F329 Major depressive disorder, single episode, unspecified: Secondary | ICD-10-CM | POA: Diagnosis not present

## 2018-01-11 DIAGNOSIS — E039 Hypothyroidism, unspecified: Secondary | ICD-10-CM | POA: Insufficient documentation

## 2018-01-11 DIAGNOSIS — I130 Hypertensive heart and chronic kidney disease with heart failure and stage 1 through stage 4 chronic kidney disease, or unspecified chronic kidney disease: Secondary | ICD-10-CM | POA: Diagnosis not present

## 2018-01-11 DIAGNOSIS — Z885 Allergy status to narcotic agent status: Secondary | ICD-10-CM | POA: Insufficient documentation

## 2018-01-11 DIAGNOSIS — Z79899 Other long term (current) drug therapy: Secondary | ICD-10-CM | POA: Diagnosis not present

## 2018-01-11 DIAGNOSIS — Z87891 Personal history of nicotine dependence: Secondary | ICD-10-CM | POA: Diagnosis not present

## 2018-01-11 DIAGNOSIS — I25110 Atherosclerotic heart disease of native coronary artery with unstable angina pectoris: Secondary | ICD-10-CM

## 2018-01-11 DIAGNOSIS — K219 Gastro-esophageal reflux disease without esophagitis: Secondary | ICD-10-CM | POA: Insufficient documentation

## 2018-01-11 DIAGNOSIS — Z886 Allergy status to analgesic agent status: Secondary | ICD-10-CM | POA: Diagnosis not present

## 2018-01-11 DIAGNOSIS — Z9842 Cataract extraction status, left eye: Secondary | ICD-10-CM | POA: Insufficient documentation

## 2018-01-11 DIAGNOSIS — Z882 Allergy status to sulfonamides status: Secondary | ICD-10-CM | POA: Insufficient documentation

## 2018-01-11 DIAGNOSIS — Z823 Family history of stroke: Secondary | ICD-10-CM | POA: Insufficient documentation

## 2018-01-11 DIAGNOSIS — M353 Polymyalgia rheumatica: Secondary | ICD-10-CM | POA: Insufficient documentation

## 2018-01-11 DIAGNOSIS — Z9889 Other specified postprocedural states: Secondary | ICD-10-CM | POA: Insufficient documentation

## 2018-01-11 DIAGNOSIS — I5032 Chronic diastolic (congestive) heart failure: Secondary | ICD-10-CM | POA: Diagnosis not present

## 2018-01-11 DIAGNOSIS — R072 Precordial pain: Secondary | ICD-10-CM

## 2018-01-11 DIAGNOSIS — Z7952 Long term (current) use of systemic steroids: Secondary | ICD-10-CM | POA: Diagnosis not present

## 2018-01-11 DIAGNOSIS — Z888 Allergy status to other drugs, medicaments and biological substances status: Secondary | ICD-10-CM | POA: Insufficient documentation

## 2018-01-11 DIAGNOSIS — E785 Hyperlipidemia, unspecified: Secondary | ICD-10-CM | POA: Insufficient documentation

## 2018-01-11 DIAGNOSIS — N183 Chronic kidney disease, stage 3 (moderate): Secondary | ICD-10-CM | POA: Insufficient documentation

## 2018-01-11 DIAGNOSIS — Z955 Presence of coronary angioplasty implant and graft: Secondary | ICD-10-CM | POA: Insufficient documentation

## 2018-01-11 DIAGNOSIS — Z7902 Long term (current) use of antithrombotics/antiplatelets: Secondary | ICD-10-CM | POA: Insufficient documentation

## 2018-01-11 DIAGNOSIS — R079 Chest pain, unspecified: Secondary | ICD-10-CM | POA: Diagnosis present

## 2018-01-11 DIAGNOSIS — I25119 Atherosclerotic heart disease of native coronary artery with unspecified angina pectoris: Principal | ICD-10-CM | POA: Insufficient documentation

## 2018-01-11 DIAGNOSIS — Z8673 Personal history of transient ischemic attack (TIA), and cerebral infarction without residual deficits: Secondary | ICD-10-CM | POA: Insufficient documentation

## 2018-01-11 DIAGNOSIS — Z8249 Family history of ischemic heart disease and other diseases of the circulatory system: Secondary | ICD-10-CM | POA: Insufficient documentation

## 2018-01-11 DIAGNOSIS — Z79890 Hormone replacement therapy (postmenopausal): Secondary | ICD-10-CM | POA: Insufficient documentation

## 2018-01-11 DIAGNOSIS — G4733 Obstructive sleep apnea (adult) (pediatric): Secondary | ICD-10-CM | POA: Diagnosis not present

## 2018-01-11 DIAGNOSIS — Z8701 Personal history of pneumonia (recurrent): Secondary | ICD-10-CM | POA: Insufficient documentation

## 2018-01-11 DIAGNOSIS — M1990 Unspecified osteoarthritis, unspecified site: Secondary | ICD-10-CM | POA: Diagnosis not present

## 2018-01-11 DIAGNOSIS — Z9841 Cataract extraction status, right eye: Secondary | ICD-10-CM | POA: Insufficient documentation

## 2018-01-11 LAB — CBC
HCT: 34.3 % — ABNORMAL LOW (ref 36.0–46.0)
Hemoglobin: 10.8 g/dL — ABNORMAL LOW (ref 12.0–15.0)
MCH: 29.9 pg (ref 26.0–34.0)
MCHC: 31.5 g/dL (ref 30.0–36.0)
MCV: 95 fL (ref 80.0–100.0)
Platelets: 255 10*3/uL (ref 150–400)
RBC: 3.61 MIL/uL — ABNORMAL LOW (ref 3.87–5.11)
RDW: 13.7 % (ref 11.5–15.5)
WBC: 15 10*3/uL — ABNORMAL HIGH (ref 4.0–10.5)
nRBC: 0 % (ref 0.0–0.2)

## 2018-01-11 LAB — BASIC METABOLIC PANEL
Anion gap: 10 (ref 5–15)
BUN: 19 mg/dL (ref 8–23)
CO2: 24 mmol/L (ref 22–32)
Calcium: 8.9 mg/dL (ref 8.9–10.3)
Chloride: 105 mmol/L (ref 98–111)
Creatinine, Ser: 1.23 mg/dL — ABNORMAL HIGH (ref 0.44–1.00)
GFR calc Af Amer: 47 mL/min — ABNORMAL LOW (ref >60)
GFR calc non Af Amer: 41 mL/min — ABNORMAL LOW (ref >60)
Glucose, Bld: 93 mg/dL (ref 70–99)
Potassium: 3.4 mmol/L — ABNORMAL LOW (ref 3.5–5.1)
Sodium: 139 mmol/L (ref 135–145)

## 2018-01-11 LAB — I-STAT TROPONIN, ED: Troponin i, poc: 0.02 ng/mL (ref 0.00–0.08)

## 2018-01-11 LAB — TROPONIN I: Troponin I: <0.03 ng/mL (ref <0.03)

## 2018-01-11 MED ORDER — MIRABEGRON ER 50 MG PO TB24
50.0000 mg | ORAL_TABLET | Freq: Every day | ORAL | Status: DC
  Administered 2018-01-11 – 2018-01-12 (×2): 50 mg via ORAL
  Filled 2018-01-11 (×2): qty 1

## 2018-01-11 MED ORDER — ASPIRIN 81 MG PO CHEW
81.0000 mg | CHEWABLE_TABLET | ORAL | Status: AC
  Administered 2018-01-12: 81 mg via ORAL
  Filled 2018-01-11: qty 1

## 2018-01-11 MED ORDER — VENLAFAXINE HCL ER 75 MG PO CP24
225.0000 mg | ORAL_CAPSULE | Freq: Every day | ORAL | Status: DC
  Administered 2018-01-12 – 2018-01-13 (×2): 225 mg via ORAL
  Filled 2018-01-11: qty 1
  Filled 2018-01-11: qty 3

## 2018-01-11 MED ORDER — ISOSORBIDE MONONITRATE ER 60 MG PO TB24
120.0000 mg | ORAL_TABLET | Freq: Every day | ORAL | Status: DC
  Administered 2018-01-12 – 2018-01-13 (×2): 120 mg via ORAL
  Filled 2018-01-11 (×2): qty 2

## 2018-01-11 MED ORDER — SODIUM CHLORIDE 0.9 % WEIGHT BASED INFUSION
3.0000 mL/kg/h | INTRAVENOUS | Status: DC
  Administered 2018-01-12: 3 mL/kg/h via INTRAVENOUS

## 2018-01-11 MED ORDER — LEVOTHYROXINE SODIUM 75 MCG PO TABS
75.0000 ug | ORAL_TABLET | Freq: Every day | ORAL | Status: DC
  Administered 2018-01-12 – 2018-01-13 (×2): 75 ug via ORAL
  Filled 2018-01-11 (×2): qty 1

## 2018-01-11 MED ORDER — CLOPIDOGREL BISULFATE 75 MG PO TABS
75.0000 mg | ORAL_TABLET | Freq: Every day | ORAL | Status: DC
  Administered 2018-01-11 – 2018-01-13 (×3): 75 mg via ORAL
  Filled 2018-01-11 (×3): qty 1

## 2018-01-11 MED ORDER — SODIUM CHLORIDE 0.9 % WEIGHT BASED INFUSION
1.0000 mL/kg/h | INTRAVENOUS | Status: DC
  Administered 2018-01-12: 1 mL/kg/h via INTRAVENOUS

## 2018-01-11 MED ORDER — AMLODIPINE BESYLATE 5 MG PO TABS
5.0000 mg | ORAL_TABLET | Freq: Every day | ORAL | Status: DC
  Administered 2018-01-12 – 2018-01-13 (×2): 5 mg via ORAL
  Filled 2018-01-11 (×2): qty 1

## 2018-01-11 MED ORDER — NITROGLYCERIN 0.4 MG SL SUBL: 0.4000 mg | SUBLINGUAL_TABLET | SUBLINGUAL | Status: DC | PRN

## 2018-01-11 MED ORDER — SODIUM CHLORIDE 0.9% FLUSH
3.0000 mL | Freq: Two times a day (BID) | INTRAVENOUS | Status: DC
  Administered 2018-01-11 – 2018-01-12 (×2): 3 mL via INTRAVENOUS

## 2018-01-11 MED ORDER — PREDNISONE 5 MG PO TABS
7.5000 mg | ORAL_TABLET | Freq: Every day | ORAL | Status: DC
  Administered 2018-01-11 – 2018-01-13 (×2): 7.5 mg via ORAL
  Filled 2018-01-11 (×5): qty 2

## 2018-01-11 MED ORDER — SODIUM CHLORIDE 0.9 % IV SOLN: 250.0000 mL | INTRAVENOUS | Status: DC | PRN

## 2018-01-11 MED ORDER — METOPROLOL SUCCINATE ER 100 MG PO TB24
100.0000 mg | ORAL_TABLET | Freq: Every day | ORAL | Status: DC
  Administered 2018-01-11 – 2018-01-13 (×3): 100 mg via ORAL
  Filled 2018-01-11 (×3): qty 1

## 2018-01-11 MED ORDER — ONDANSETRON HCL 4 MG/2ML IJ SOLN: 4.0000 mg | Freq: Four times a day (QID) | INTRAMUSCULAR | Status: DC | PRN

## 2018-01-11 MED ORDER — ATORVASTATIN CALCIUM 10 MG PO TABS
10.0000 mg | ORAL_TABLET | Freq: Every day | ORAL | Status: DC
  Administered 2018-01-11 – 2018-01-12 (×2): 10 mg via ORAL
  Filled 2018-01-11 (×2): qty 1

## 2018-01-11 MED ORDER — SODIUM CHLORIDE 0.9% FLUSH: 3.0000 mL | INTRAVENOUS | Status: DC | PRN

## 2018-01-11 MED ORDER — ACETAMINOPHEN 325 MG PO TABS: 650.0000 mg | ORAL_TABLET | ORAL | Status: DC | PRN

## 2018-01-11 MED ORDER — HEPARIN SODIUM (PORCINE) 5000 UNIT/ML IJ SOLN
5000.0000 [IU] | Freq: Three times a day (TID) | INTRAMUSCULAR | Status: DC
  Administered 2018-01-11 – 2018-01-13 (×5): 5000 [IU] via SUBCUTANEOUS
  Filled 2018-01-11 (×5): qty 1

## 2018-01-11 MED ORDER — RANOLAZINE ER 500 MG PO TB12
1000.0000 mg | ORAL_TABLET | Freq: Two times a day (BID) | ORAL | Status: DC
  Administered 2018-01-11 – 2018-01-13 (×4): 1000 mg via ORAL
  Filled 2018-01-11 (×4): qty 2

## 2018-01-11 NOTE — ED Triage Notes (Signed)
Pt in from [facility] via [place] with L shoulder/arm pain, started last night. Had some SOB last night. Denies dizziness, n/v, cp or sob at this time. Hx of CHF. A&ox4, VSS

## 2018-01-11 NOTE — ED Notes (Signed)
Pt up to bedpan and tolerates qwell.

## 2018-01-11 NOTE — ED Notes (Signed)
Pt returned from X-ray.  

## 2018-01-11 NOTE — ED Notes (Signed)
Pt stats that she had chest pain lasting 5 minutes or less and has now resolved.

## 2018-01-11 NOTE — ED Notes (Signed)
Pt resting in bed at this time. Family member at bedside.

## 2018-01-11 NOTE — ED Provider Notes (Signed)
[facility] Provider Note   CSN: [id number] Arrival date & time: 01/11/18  0854     History   Chief Complaint Chief Complaint  Patient presents with  . Shoulder Pain    HPI Erin Ingram is a 77 y.o. female.  The history is provided by the patient, the EMS personnel and medical records. No language interpreter was used.  Shoulder Pain     Erin Ingram is a 77 y.o. female who presents to the Emergency Department complaining of arm/neck pain. To the emergency department complaining of pain in her left arm, neck and back. Symptoms began two days ago and is described as a squeezing sensation. Symptoms come and go but significantly worsened last night and today and are coming more frequently. Episodes last for one to several minutes at a time and are worse with exertion. She has chronic exertional dyspnea and this is unchanged from her baseline. She states that pain is similar to prior cardiac pain. She denies any fevers, cough, abdominal pain, leg swelling or pain. No recent illnesses or medication changes. She is a resident at [facility]. Past Medical History:  Diagnosis Date  . (HFpEF) heart failure with preserved ejection fraction (HCC)    a. normal EF, LVEDP at [facility] in 6/17 33 >> Lasix started   . Anemia   . Arthritis    "fingers, back, shoulders, hips" (04/10/2016)  . Chronic diastolic CHF (congestive heart failure) ([place])   . CKD (chronic kidney disease), stage III ([place])   . Coronary artery disease    a. s/p CABG 2000 (SVG/ free LIMA Y graft to the diagonal and distal LAD, SVG to the OM 1, and SVG to the PDA) . b. Cath 12/19/2014 90% dSVG to RCA s/p 2 overlapping DES. c. LHC 2016, 2017 no intervention except diuresis needed. d. Low risk nuc 03/2016.  . Depression    with anxious component  . GERD (gastroesophageal reflux disease)   . History of hiatal hernia   . Hyperlipidemia   . Hypothyroidism   . Obesity     . OSA on CPAP   . PMR (polymyalgia rheumatica) (HCC)   . Pneumonia    "2-3 times" (04/10/2016)  . Stable angina (HCC)    microvascular, improved with Ranexa  . Subclavian artery stenosis ([place])   . TIA (transient ischemic attack)     Patient Active Problem List   Diagnosis Date Noted  . Leukocytosis 06/25/2017  . Tremor 06/15/2017  . Gait abnormality 06/15/2017  . Polymyalgia rheumatica ([place]) 06/11/2017  . Fall 06/11/2017  . Visual hallucination 06/10/2017  . GERD (gastroesophageal reflux disease) 06/10/2017  . Hypothyroidism 06/10/2017  . Abnormal movement 06/10/2017  . Dysphagia 07/27/2016  . Irritable bowel syndrome with diarrhea 07/27/2016  . Chest pain 04/10/2016  . Unstable angina pectoris ([place]) 04/10/2016  . Bilateral lower extremity edema 03/13/2016  . (HFpEF) heart failure with preserved ejection fraction ([place]) 10/08/2015  . CAD (coronary artery disease) 10/08/2015  . Chest pain at rest   . Subclavian arterial stenosis ([place])   . Dyslipidemia   . Hyperlipidemia LDL goal <70   . PAD (peripheral artery disease) ([place]) 04/11/2015  . Essential hypertension 04/11/2014  . Morbid obesity ([place]) 03/22/2013  . Anemia, unspecified 03/22/2013  . Depression, recurrent ([place]) 03/22/2013  . Generalized osteoarthrosis, unspecified site 03/22/2013  . Sleep apnea 03/22/2013  . Impaired fasting glucose 03/22/2013  . 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 Advanced care planning/counseling  discussion 03/22/2013    Past Surgical History:  Procedure Laterality Date  . BREAST BIOPSY Right 1964  . CARDIAC CATHETERIZATION  08   patent grafts, no culprit lesions, EF 65%  . CARDIAC CATHETERIZATION N/A 12/19/2014   Procedure: Left Heart Cath and Cors/Grafts Angiography;  Surgeon: Jettie Booze, MD;  Location: [facility];  Service: Cardiovascular;  Laterality: N/A;  . CARDIAC CATHETERIZATION N/A 12/19/2014   Procedure: Coronary Stent Intervention;  Surgeon: Jettie Booze, MD;  Location: [facility];  Service: Cardiovascular;  Laterality: N/A;  . CARDIAC CATHETERIZATION N/A 01/01/2015   Procedure: Left Heart Cath and Cors/Grafts Angiography;  Surgeon: Jettie Booze, MD;  Location: [facility];  Service: Cardiovascular;  Laterality: N/A;  . CARDIAC CATHETERIZATION N/A 09/20/2015   Procedure: Left Heart Cath and Cors/Grafts Angiography;  Surgeon: Jettie Booze, MD;  Location: [facility];  Service: Cardiovascular;  Laterality: N/A;  . CATARACT EXTRACTION W/ INTRAOCULAR LENS  IMPLANT, BILATERAL Bilateral 2011  . COLONOSCOPY WITH PROPOFOL N/A 07/27/2016   Procedure: COLONOSCOPY WITH PROPOFOL;  Surgeon: Wilford Corner, MD;  Location: [facility];  Service: Endoscopy;  Laterality: N/A;  . CORONARY ARTERY BYPASS GRAFT  2000   ASCVD, multivessel, S./P.  . DILATION AND CURETTAGE OF UTERUS  1980s  . ESOPHAGOGASTRODUODENOSCOPY ([facility]) WITH PROPOFOL N/A 07/27/2016   Procedure: ESOPHAGOGASTRODUODENOSCOPY (EGD) WITH PROPOFOL;  Surgeon: Wilford Corner, MD;  Location: [facility];  Service: Endoscopy;  Laterality: N/A;  . FRACTURE SURGERY    . PATELLA FRACTURE SURGERY Left 1993     OB History   None      Home Medications    Prior to Admission medications   Medication Sig Start Date End Date Taking? Authorizing Provider  alendronate (FOSAMAX) 70 MG tablet Take 70 mg by mouth once a week. Take with a full glass of water on an empty stomach.   Yes [provider]  amLODipine (NORVASC) 5 MG tablet TAKE 1 TABLET BY MOUTH DAILY. Patient taking differently: Take 5 mg by mouth daily.  09/22/17  Yes Jettie Booze, MD  atorvastatin (LIPITOR) 10 MG tablet TAKE 1 TABLET ONCE DAILY. Patient taking differently: Take 10 mg by mouth daily at 6 PM.  12/17/17  Yes Dunn, Dayna N, PA-C  Cholecalciferol (VITAMIN D PO) Take 2,000 Units by mouth daily.    Yes [provider]  Cinnamon 500 MG TABS Take 1 each by mouth daily.    Yes [provider]  clopidogrel (PLAVIX) 75 MG tablet TAKE 1 TABLET ONCE DAILY. Patient taking differently: Take 75 mg by mouth daily.  09/22/17  Yes Jettie Booze, MD  Coenzyme Q10 10 MG capsule Take 10 mg by mouth at bedtime.    Yes [provider]  fluticasone (FLONASE) 50 MCG/ACT nasal spray Place 2 sprays into both nostrils daily as needed for allergies.    Yes [provider]  furosemide (LASIX) 20 MG tablet Take 1 tablet (20 mg total) by mouth daily. Patient taking differently: Take 20 mg by mouth daily. If swelling take an additional 20 mg 06/24/17  Yes Jettie Booze, MD  Glucosamine-Chondroit-Vit C-Mn (GLUCOSAMINE 1500 COMPLEX) CAPS Take 2 capsules by mouth daily after breakfast.   Yes [provider]  isosorbide mononitrate (IMDUR) 60 MG 24 hr tablet Take 1 tablet (60 mg total) by mouth daily. Patient taking differently: Take 120 mg by mouth daily.  12/17/17  Yes Jettie Booze, MD  levocetirizine Harlow Ohms) 5  MG tablet Take 5 mg by mouth every evening.   Yes [provider]  levothyroxine (SYNTHROID, LEVOTHROID) 75 MCG tablet Take 75 mcg by mouth daily before breakfast.    Yes [provider]  Melatonin 1 MG TABS Take 1 mg by mouth as needed (sleep).   Yes [provider]  metoprolol succinate (TOPROL-XL) 100 MG 24 hr tablet TAKE 1 TABLET DAILY WITH OR IMMEDIATELY FOLLOWING A MEAL. Patient taking differently: Take 100 mg by mouth daily.  12/17/17  Yes Simmons, Brittainy M, PA-C  mirabegron ER (MYRBETRIQ) 50 MG TB24 tablet Take 50 mg by mouth at bedtime.    Yes [provider]  nitroGLYCERIN (NITROSTAT) 0.4 MG SL tablet Place 0.4 mg under the tongue every 5 (five) minutes as needed for chest pain. X 3 doses   Yes [provider]  NONFORMULARY OR COMPOUNDED ITEM [facility]:  Achilles Tendonitis Cream - Diclofenac 3%, Baclofen 2%, Bupivacaine 1%, GAbapentin 6%, Ibuprofen 3%, Pentoxifylline 3%,  apply 1-2 grams to affected area 3-4 times daily. Patient taking differently: as needed. [facility]:  Achilles Tendonitis Cream - Diclofenac 3%, Baclofen 2%, Bupivacaine 1%, GAbapentin 6%, Ibuprofen 3%, Pentoxifylline 3%, apply 1-2 grams to affected area 3-4 times daily. 02/28/16  Yes Edrick Kins, DPM  predniSONE (DELTASONE) 5 MG tablet Take 7.5 mg by mouth daily. with food or milk by mouth once daily.   Yes [provider]  PSYLLIUM PO Take 6 capsules by mouth at bedtime.    Yes [provider]  RANEXA 1000 MG SR tablet TAKE 1 TABLET BY MOUTH TWICE DAILY. Patient taking differently: Take 1,000 mg by mouth 2 (two) times daily.  09/22/17  Yes Jettie Booze, MD  ranitidine (ZANTAC) 150 MG capsule Take 150 mg by mouth 2 (two) times daily.   Yes [provider]  venlafaxine XR (EFFEXOR-XR) 75 MG 24 hr capsule Take 225 mg by mouth daily with breakfast. Patient takes 3 capsules   Yes [provider]    Family History Family History  Problem Relation Age of Onset  . Heart disease Mother   . Heart attack Mother   . Heart disease Father   . Heart attack Father   . Diabetes Brother   . Pulmonary embolism Brother   . Stroke Paternal Grandmother   . Hypertension Neg Hx     Social History Social History   Tobacco Use  . Smoking status: Former Smoker    Packs/day: 0.50    Years: 7.00    Pack years: 3.50    Types: Cigarettes    Last attempt to quit: 08/21/1969    Years since quitting: 48.4  . Smokeless tobacco: Never Used  Substance Use Topics  . Alcohol use: No    Alcohol/week: 0.0 standard drinks    Frequency: Never    Comment: rare  . Drug use: No     Allergies   Nsaids; Butorphanol; Sulfa antibiotics; Bisoprolol fumarate; Butorphanol tartrate; Codeine; Demerol [meperidine]; Imipramine; Meperidine and related; Statins; Tequin [gatifloxacin]; Brilinta [ticagrelor]; Pseudoephedrine hcl; Septra [sulfamethoxazole-trimethoprim]; and  Sulfamethoxazole-trimethoprim   Review of Systems Review of Systems  All other systems reviewed and are negative.    Physical Exam Updated Vital Signs BP (!) 117/53   Pulse 61   Temp 97.8 F (36.6 C) (Oral)   Resp 12   Ht 4\' 10"  (1.473 m)   Wt 90.7 kg   SpO2 98%   BMI 41.80 kg/m   Physical Exam  Constitutional: She is oriented  to person, place, and time. She appears well-developed and well-nourished.  HENT:  Head: Normocephalic and atraumatic.  Cardiovascular: Normal rate and regular rhythm.  No murmur heard. Pulmonary/Chest: Effort normal and breath sounds normal. No respiratory distress.  Abdominal: Soft. There is no tenderness. There is no rebound and no guarding.  Musculoskeletal: She exhibits no tenderness.  2+ radial pulses bilaterally. Trace pitting edema to bilateral lower extremities.  Neurological: She is alert and oriented to person, place, and time.  Skin: Skin is warm and dry.  Psychiatric: She has a normal mood and affect. Her behavior is normal.  Nursing note and vitals reviewed.    ED Treatments / Results  Labs (all labs ordered are listed, but only abnormal results are displayed) Labs Reviewed  BASIC METABOLIC PANEL - Abnormal; Notable for the following components:      Result Value   Potassium 3.4 (*)    Creatinine, Ser 1.23 (*)    GFR calc non Af Amer 41 (*)    GFR calc Af Amer 47 (*)    All other components within normal limits  CBC - Abnormal; Notable for the following components:   WBC 15.0 (*)    RBC 3.61 (*)    Hemoglobin 10.8 (*)    HCT 34.3 (*)    All other components within normal limits  I-STAT TROPONIN, ED    EKG EKG Interpretation  Date/Time:  Tuesday January 11 2018 09:37:11 EDT Ventricular Rate:  57 PR Interval:    QRS Duration: 89 QT Interval:  469 QTC Calculation: 457 R Axis:   8 Text Interpretation:  Sinus rhythm Low voltage, precordial leads Abnormal R-wave progression, early transition Nonspecific T  abnormalities, lateral leads Confirmed by Quintella Reichert [phone]) on 01/11/2018 9:45:19 AM   Radiology Dg Chest 2 View  Result Date: 01/11/2018 CLINICAL DATA:  Left shoulder and arm pain since last night EXAM: CHEST - 2 VIEW COMPARISON:  04/04/2017 FINDINGS: Bilateral diffuse mild interstitial thickening. No pleural effusion or pneumothorax. Stable cardiomegaly. Prior CABG. Thoracic aortic atherosclerosis. No acute osseous abnormality. IMPRESSION: Cardiomegaly with mild pulmonary vascular congestion. Electronically Signed   By: Kathreen Devoid   On: 01/11/2018 12:37    Procedures Procedures (including critical care time)  Medications Ordered in ED Medications - No data to display   Initial Impression / Assessment and Plan / ED Course  I have reviewed the triage vital signs and the nursing notes.  Pertinent labs & imaging results that were available during my care of the patient were reviewed by me and considered in my medical decision making (see chart for details).     Patient with history of CHF, coronary artery disease here for evaluation of progressive intermittent chest pain and dyspnea on exertion. EKG without acute ischemic changes. Patient is pain free on evaluation in the emergency department. Cardiology consulted for recommendations.  Final Clinical Impressions(s) / ED Diagnoses   Final diagnoses:  None    ED Discharge Orders    None       Quintella Reichert, MD 01/11/18 1546

## 2018-01-11 NOTE — H&P (Addendum)
Cardiology Admission History and Physical:   Patient ID: Erin Ingram MRN: [id number]; DOB: 02-11-1941   Admission date: 01/11/2018  Primary Care Provider: Mast, Erin X, NP Primary Cardiologist: Erin Grooms, MD  Primary Electrophysiologist:  None   Chief Complaint:  Chest pain  Patient Profile:   Erin Ingram is a 77 y.o. female with PMH of polymyalgia rheumatica, TIA, OSA on CPAP, hypothyroidism, HTN, HLD, CAD s/p CABG, CKD stage III, left subclavian artery stenosis and chronic diastolic heart failure who presented with chest pain  History of Present Illness:   Erin Ingram is a 77 y.o. female with PMH of polymyalgia rheumatica, TIA, OSA on CPAP, hypothyroidism, HTN, HLD, CAD s/p CABG, CKD stage III, left subclavian artery stenosis and chronic diastolic heart failure. Patient had CABG in 2000 with SVG/free LIMA Y graft to the diagonal and distal LAD, SVG to OM1 and SVG to PDA. She underwent 2 DES to SVG to RCA in September 2016.last cardiac catheterization in 2017 showed stable anatomy, medical therapy was recommended. She had a low risk mildly generally 2018. During her previous office visit with Dr. Irish Ingram in February 2019, she continued to complain of exertional dyspnea which is chronic for her but no chest pain.  She presented to [facility] on 01/11/2018 for evaluation of left arm pain, back pain and chest pain. This has been going on for the past week. The chest pain, and goes and last about one to 2 minutes each episode. She noticed the symptoms mostly at night when she is not doing anything. She has not noticed significant change was her baseline dyspnea on exertion either.initial laboratory showed potassium 3.4, creatinine 1.23. Point-of-care troponin 0.02. White blood cell count since to be chronically elevated at 15. Hemoglobin was 10.8. He has been consulted for chest pain. Otherwise she denies any lower extremity edema, orthopnea or PND.   Past Medical  History:  Diagnosis Date  . (HFpEF) heart failure with preserved ejection fraction (HCC)    a. normal EF, LVEDP at [facility] in 6/17 33 >> Lasix started   . Anemia   . Arthritis    "fingers, back, shoulders, hips" (04/10/2016)  . Chronic diastolic CHF (congestive heart failure) ([place])   . CKD (chronic kidney disease), stage III ([place])   . Coronary artery disease    a. s/p CABG 2000 (SVG/ free LIMA Y graft to the diagonal and distal LAD, SVG to the OM 1, and SVG to the PDA) . b. Cath 12/19/2014 90% dSVG to RCA s/p 2 overlapping DES. c. LHC 2016, 2017 no intervention except diuresis needed. d. Low risk nuc 03/2016.  . Depression    with anxious component  . GERD (gastroesophageal reflux disease)   . History of hiatal hernia   . Hyperlipidemia   . Hypothyroidism   . Obesity   . OSA on CPAP   . PMR (polymyalgia rheumatica) (HCC)   . Pneumonia    "2-3 times" (04/10/2016)  . Stable angina (HCC)    microvascular, improved with Ranexa  . Subclavian artery stenosis ([place])   . TIA (transient ischemic attack)     Past Surgical History:  Procedure Laterality Date  . BREAST BIOPSY Right 1964  . CARDIAC CATHETERIZATION  08   patent grafts, no culprit lesions, EF 65%  . CARDIAC CATHETERIZATION N/A 12/19/2014   Procedure: Left Heart Cath and Cors/Grafts Angiography;  Surgeon: Jettie Booze, MD;  Location: [place] [facility];  Service: Cardiovascular;  Laterality: N/A;  . CARDIAC  CATHETERIZATION N/A 12/19/2014   Procedure: Coronary Stent Intervention;  Surgeon: Jettie Booze, MD;  Location: [place] [facility];  Service: Cardiovascular;  Laterality: N/A;  . CARDIAC CATHETERIZATION N/A 01/01/2015   Procedure: Left Heart Cath and Cors/Grafts Angiography;  Surgeon: Jettie Booze, MD;  Location: [place] [facility];  Service: Cardiovascular;  Laterality: N/A;  . CARDIAC CATHETERIZATION N/A 09/20/2015   Procedure: Left Heart Cath and Cors/Grafts Angiography;  Surgeon: Jettie Booze, MD;   Location: [place] [facility];  Service: Cardiovascular;  Laterality: N/A;  . CATARACT EXTRACTION W/ INTRAOCULAR LENS  IMPLANT, BILATERAL Bilateral 2011  . COLONOSCOPY WITH PROPOFOL N/A 07/27/2016   Procedure: COLONOSCOPY WITH PROPOFOL;  Surgeon: Wilford Corner, MD;  Location: [place];  Service: Endoscopy;  Laterality: N/A;  . CORONARY ARTERY BYPASS GRAFT  2000   ASCVD, multivessel, S./P.  . DILATION AND CURETTAGE OF UTERUS  1980s  . ESOPHAGOGASTRODUODENOSCOPY (EGD) WITH PROPOFOL N/A 07/27/2016   Procedure: ESOPHAGOGASTRODUODENOSCOPY (EGD) WITH PROPOFOL;  Surgeon: Wilford Corner, MD;  Location: [place];  Service: Endoscopy;  Laterality: N/A;  . FRACTURE SURGERY    . PATELLA FRACTURE SURGERY Left 1993     Medications Prior to Admission: Prior to Admission medications   Medication Sig Start Date End Date Taking? Authorizing Provider  alendronate (FOSAMAX) 70 MG tablet Take 70 mg by mouth once a week. Take with a full glass of water on an empty stomach.   Yes [provider]  amLODipine (NORVASC) 5 MG tablet TAKE 1 TABLET BY MOUTH DAILY. Patient taking differently: Take 5 mg by mouth daily.  09/22/17  Yes Jettie Booze, MD  atorvastatin (LIPITOR) 10 MG tablet TAKE 1 TABLET ONCE DAILY. Patient taking differently: Take 10 mg by mouth daily at 6 PM.  12/17/17  Yes Dunn, Dayna N, PA-C  Cholecalciferol (VITAMIN D PO) Take 2,000 Units by mouth daily.    Yes [provider]  Cinnamon 500 MG TABS Take 1 each by mouth daily.   Yes [provider]  clopidogrel (PLAVIX) 75 MG tablet TAKE 1 TABLET ONCE DAILY. Patient taking differently: Take 75 mg by mouth daily.  09/22/17  Yes Jettie Booze, MD  Coenzyme Q10 10 MG capsule Take 10 mg by mouth at bedtime.    Yes [provider]  fluticasone (FLONASE) 50 MCG/ACT nasal spray Place 2 sprays into both nostrils daily as needed for allergies.    Yes [provider]  furosemide (LASIX) 20 MG tablet  Take 1 tablet (20 mg total) by mouth daily. Patient taking differently: Take 20 mg by mouth daily. If swelling take an additional 20 mg 06/24/17  Yes Jettie Booze, MD  Glucosamine-Chondroit-Vit C-Mn (GLUCOSAMINE 1500 COMPLEX) CAPS Take 2 capsules by mouth daily after breakfast.   Yes [provider]  isosorbide mononitrate (IMDUR) 60 MG 24 hr tablet Take 1 tablet (60 mg total) by mouth daily. Patient taking differently: Take 120 mg by mouth daily.  12/17/17  Yes Jettie Booze, MD  levocetirizine (XYZAL) 5 MG tablet Take 5 mg by mouth every evening.   Yes [provider]  levothyroxine (SYNTHROID, LEVOTHROID) 75 MCG tablet Take 75 mcg by mouth daily before breakfast.    Yes [provider]  Melatonin 1 MG TABS Take 1 mg by mouth as needed (sleep).   Yes [provider]  metoprolol succinate (TOPROL-XL) 100 MG 24 hr tablet TAKE 1 TABLET DAILY WITH OR IMMEDIATELY FOLLOWING A MEAL. Patient taking differently: Take 100  mg by mouth daily.  12/17/17  Yes Simmons, Brittainy M, PA-C  mirabegron ER (MYRBETRIQ) 50 MG TB24 tablet Take 50 mg by mouth at bedtime.    Yes [provider]  nitroGLYCERIN (NITROSTAT) 0.4 MG SL tablet Place 0.4 mg under the tongue every 5 (five) minutes as needed for chest pain. Ingram 3 doses   Yes [provider]  NONFORMULARY OR COMPOUNDED ITEM [facility]:  Achilles Tendonitis Cream - Diclofenac 3%, Baclofen 2%, Bupivacaine 1%, GAbapentin 6%, Ibuprofen 3%, Pentoxifylline 3%, apply 1-2 grams to affected area 3-4 times daily. Patient taking differently: as needed. [facility]:  Achilles Tendonitis Cream - Diclofenac 3%, Baclofen 2%, Bupivacaine 1%, GAbapentin 6%, Ibuprofen 3%, Pentoxifylline 3%, apply 1-2 grams to affected area 3-4 times daily. 02/28/16  Yes Edrick Kins, DPM  predniSONE (DELTASONE) 5 MG tablet Take 7.5 mg by mouth daily. with food or milk by mouth once daily.   Yes [provider]    PSYLLIUM PO Take 6 capsules by mouth at bedtime.    Yes [provider]  RANEXA 1000 MG SR tablet TAKE 1 TABLET BY MOUTH TWICE DAILY. Patient taking differently: Take 1,000 mg by mouth 2 (two) times daily.  09/22/17  Yes Jettie Booze, MD  ranitidine (ZANTAC) 150 MG capsule Take 150 mg by mouth 2 (two) times daily.   Yes [provider]  venlafaxine XR (EFFEXOR-XR) 75 MG 24 hr capsule Take 225 mg by mouth daily with breakfast. Patient takes 3 capsules   Yes [provider]     Allergies:    Allergies  Allergen Reactions  . Nsaids Other (See Comments)    Due to chronic kidney failure  . Butorphanol Other (See Comments)    agitation Constipation Produced a lot of urine, made patient feel crazy  . Sulfa Antibiotics Rash  . Bisoprolol Fumarate Other (See Comments)    Doesn't remember  . Butorphanol Tartrate Other (See Comments)    Produced a lot of urine, made patient feel crazy  . Codeine Nausea And Vomiting  . Demerol [Meperidine] Nausea And Vomiting  . Imipramine     Sweating, facial dysfunction   . Meperidine And Related Nausea And Vomiting  . Statins     MYALGIAS  . Tequin [Gatifloxacin] Other (See Comments)    Caused hypoglycemia Low blood sugar  . Brilinta [Ticagrelor] Rash    CAUSES PETECHIAE, PURPURA  . Pseudoephedrine Hcl Palpitations  . Septra [Sulfamethoxazole-Trimethoprim] Rash  . Sulfamethoxazole-Trimethoprim Rash    Social History:   Social History   Socioeconomic History  . Marital status: Married    Spouse name: Not on file  . Number of children: 2  . Years of education: College  . Highest education level: Not on file  Occupational History  . Occupation: Retired Cytogeneticist  Social Needs  . Financial resource strain: Not hard at all  . Food insecurity:    Worry: Never true    Inability: Never true  . Transportation needs:    Medical: No    Non-medical: No  Tobacco Use  . Smoking status: Former Smoker     Packs/day: 0.50    Years: 7.00    Pack years: 3.50    Types: Cigarettes    Last attempt to quit: 08/21/1969    Years since quitting: 48.4  . Smokeless tobacco: Never Used  Substance and Sexual Activity  . Alcohol use: No    Alcohol/week: 0.0 standard drinks    Frequency: Never  Comment: rare  . Drug use: No  . Sexual activity: Never  Lifestyle  . Physical activity:    Days per week: 0 days    Minutes per session: 0 min  . Stress: To some extent  Relationships  . Social connections:    Talks on phone: More than three times a week    Gets together: More than three times a week    Attends religious service: Never    Active member of club or organization: No    Attends meetings of clubs or organizations: Never    Relationship status: Married  . Intimate partner violence:    Fear of current or ex partner: No    Emotionally abused: No    Physically abused: No    Forced sexual activity: No  Other Topics Concern  . Not on file  Social History Narrative   Social History     Social History Narrative       Lives in [place] with husband.   Diet: Regular   Do you drink/eat things with caffeine? 1 cup caffeine per day.  Not much coffee   Marital status: Married                           What year were you married? 1964   Do you live in a house, apartment, assisted living, condo, trailer, etc)? [facility] at [facility]   Is it one or more stories?   How many persons live in your home? 2   Do you have any pets in your home? No   Current or past profession: Engineer, production (RN)   Do you exercise? I did                                                Type & how often: Swimming, Tai Chi, exercise classes   Do you have a living will? yes   Do you have a DNR Form? Yes   Do you have a POA/HPOA forms?              Family History:   The patient's family history includes Diabetes in her brother; Heart attack in her father and mother; Heart disease in her father and mother; Pulmonary embolism  in her brother; Stroke in her paternal grandmother. There is no history of Hypertension.    ROS:  Please see the history of present illness.  All other ROS reviewed and negative.     Physical Exam/Data:   Vitals:   01/11/18 1045 01/11/18 1146 01/11/18 1201 01/11/18 1215  BP: (!) 133/54 (!) 118/52 (!) 151/98 (!) 137/59  Pulse: 65 70 73 67  Resp: _0 Temp:      TempSrc:      SpO2: 94% 92% 97% 98%  Weight:      Height:       No intake or output data in the 24 hours ending 01/11/18 1305 Filed Weights   01/11/18 0903 01/11/18 1013  Weight: 90.5 kg 90.7 kg   Body mass index is 41.8 kg/m.  General:  Well nourished, well developed, in no acute distress HEENT: normal Lymph: no adenopathy Neck: no JVD Endocrine:  No thryomegaly Vascular: No carotid bruits; FA pulses 2+ bilaterally without bruits  Cardiac:  normal S1, S2; RRR; no murmur  Lungs:  clear to auscultation bilaterally, no wheezing, rhonchi or rales  Abd: soft, nontender, no hepatomegaly  Ext: no edema Musculoskeletal:  No deformities, BUE and BLE strength normal and equal Skin: warm and dry  Neuro:  CNs 2-12 intact, no focal abnormalities noted Psych:  Normal affect    EKG:  The ECG that was done in ED was personally reviewed and demonstrates sinus bradycardia without obvious ST-T wave changes  Relevant CV Studies:  Cath 09/20/2015  Severe three vessel CAD.  Patent SVG to OM.  Patent SVG to PDA. Patent stents in this graft.  Y graft with free LIMA to LAD and SVG to diagonal. LIMA portion is patent while vein graft to diagonal is occluded.  Elevated LVEDP 33 mm Hg.   No change in coronary anatomy since October 2016. Patent stents in the SVG to PDA. Elevated LVEDP. Would diurese with Lasix to help reduce intravascular volume.  Diagnostic Diagram       Laboratory Data:  Chemistry Recent Labs  Lab 01/11/18 0925  NA 139  K 3.4*  CL 105  CO2 24  GLUCOSE 93  BUN 19  CREATININE 1.23*    CALCIUM 8.9  GFRNONAA 41*  GFRAA 47*  ANIONGAP 10    No results for input(s): PROT, ALBUMIN, AST, ALT, ALKPHOS, BILITOT in the last 168 hours. Hematology Recent Labs  Lab 01/11/18 0925  WBC 15.0*  RBC 3.61*  HGB 10.8*  HCT 34.3*  MCV 95.0  MCH 29.9  MCHC 31.5  RDW 13.7  PLT 255   Cardiac EnzymesNo results for input(s): TROPONINI in the last 168 hours.  Recent Labs  Lab 01/11/18 0938  TROPIPOC 0.02    BNPNo results for input(s): BNP, PROBNP in the last 168 hours.  DDimer No results for input(s): DDIMER in the last 168 hours.  Radiology/Studies:  Dg Chest 2 View  Result Date: 01/11/2018 CLINICAL DATA:  Left shoulder and arm pain since last night EXAM: CHEST - 2 VIEW COMPARISON:  04/04/2017 FINDINGS: Bilateral diffuse mild interstitial thickening. No pleural effusion or pneumothorax. Stable cardiomegaly. Prior CABG. Thoracic aortic atherosclerosis. No acute osseous abnormality. IMPRESSION: Cardiomegaly with mild pulmonary vascular congestion. Electronically Signed   By: Kathreen Devoid   On: 01/11/2018 12:37    Assessment and Plan:   1. Chest pain: Intermittent for the past week. No ears exacerbating or relieving factors. No clear correlation with exertion. However the left arm pain and back pain is similar to the previous angina. Will discuss with M.D regarding stress test versus cardiac catheterization.  2. CAD s/p CABG in 2000: on Plavix monotherapy at home. He has been compliant with her medication other than missing one time morning medication 3 days ago.  3. polymyalgia rheumatica: on chronic steroid  4. h/o TIA: no recent recurrence   5. OSA on CPAP  6.  7. Hypothyroidism: managed by primary care provider  8. HTN  9. HLD: Continue Lipitor  10. CKD stage III  11. left subclavian artery stenosis: no left radial access with cardiac catheterization  12. chronic diastolic heart failure: Patient just be euvolemic on physical exam.    Severity of  Illness: The appropriate patient status for this patient is OBSERVATION. Observation status is judged to be reasonable and necessary in order to provide the required intensity of service to ensure the patient's safety. The patient's presenting symptoms, physical exam findings, and initial radiographic and laboratory data in the context of their medical condition is felt to place them at decreased  risk for further clinical deterioration. Furthermore, it is anticipated that the patient will be medically stable for discharge from the hospital within 2 midnights of admission. The following factors support the patient status of observation.   " The patient's presenting symptoms include Chest pain. " The physical exam findings include benign. " The initial radiographic and laboratory data are normal.     For questions or updates, please contact [place] Please consult [email or web address] for contact info under        Signed, Almyra Deforest, [place]  01/11/2018 1:05 PM   I have personally seen and examined this patient with Almyra Deforest, PA. I agree with the assessment and plan as outlined above. She has known CAD with CABG in 2000. Last cath in 2017 with stable CAD. She did have stents placed in the SVG to RCA in 2016. Her presentation is with left shoulder and arm pain that is consistent with prior anginal pain. Her pain is not worsened with exertion but she is inactive. She also has polymyalgia rheumatica. EKG is reviewed by me and shows sinus rhythm with non-specific T wave abnormalities. No ischemic changes. Troponin is negative.  Labs reviewed by me.  My exam: Elderly, obese female in NAD. CV:RRR with soft systolic murmur. Pulm: clear bilaterally. Abd: soft, NT. Ext: no edema.  Her presentation is with chest pain that is similar to her prior anginal pain. Review of office notes suggests chronic pain. She states that her pain over the past week is not similar to her chronic pain over the past few years. Her  pain is not reproducible with movement of her left arm. She is known to have left subclavian artery stenosis. There is no objective evidence of ischemia. Given her symptoms we have discussed stress testing vs repeat cardiac cath. She would prefer to proceed with a cardiac catheterization as she is very concerned about her symptoms.  We will admit her to telemetry and continue to cycle cardiac enzymes.  We will plan a cardiac cath tomorrow to exclude progression of her CAD. Her bypass grafts are 78 years old.  Continue ASA, Plavix, beta blocker, Imdur and Ranexa.   Lauree Chandler 01/11/2018 2:43 PM

## 2018-01-11 NOTE — ED Notes (Signed)
Patient transported to X-ray 

## 2018-01-12 ENCOUNTER — Encounter (HOSPITAL_COMMUNITY)
Admission: EM | Disposition: A | Discharge status: Home / Self Care | Payer: Self-pay | Source: Home / Self Care | Attending: Emergency Medicine

## 2018-01-12 ENCOUNTER — Encounter (HOSPITAL_COMMUNITY): Payer: Self-pay | Admitting: Interventional Cardiology

## 2018-01-12 DIAGNOSIS — I25119 Atherosclerotic heart disease of native coronary artery with unspecified angina pectoris: Secondary | ICD-10-CM

## 2018-01-12 DIAGNOSIS — I130 Hypertensive heart and chronic kidney disease with heart failure and stage 1 through stage 4 chronic kidney disease, or unspecified chronic kidney disease: Secondary | ICD-10-CM | POA: Diagnosis not present

## 2018-01-12 DIAGNOSIS — I25118 Atherosclerotic heart disease of native coronary artery with other forms of angina pectoris: Secondary | ICD-10-CM | POA: Diagnosis not present

## 2018-01-12 DIAGNOSIS — Z951 Presence of aortocoronary bypass graft: Secondary | ICD-10-CM | POA: Diagnosis not present

## 2018-01-12 DIAGNOSIS — I5032 Chronic diastolic (congestive) heart failure: Secondary | ICD-10-CM | POA: Diagnosis not present

## 2018-01-12 HISTORY — PX: LEFT HEART CATH AND CORS/GRAFTS ANGIOGRAPHY: CATH118250

## 2018-01-12 LAB — TROPONIN I
Troponin I: <0.03 ng/mL (ref <0.03)
Troponin I: <0.03 ng/mL (ref <0.03)

## 2018-01-12 LAB — LIPID PANEL
Cholesterol: 163 mg/dL (ref 0–200)
HDL: 59 mg/dL (ref >40)
LDL Cholesterol: 64 mg/dL (ref 0–99)
Total CHOL/HDL Ratio: 2.8 RATIO
Triglycerides: 198 mg/dL — ABNORMAL HIGH (ref <150)
VLDL: 40 mg/dL (ref 0–40)

## 2018-01-12 LAB — BASIC METABOLIC PANEL
Anion gap: 7 (ref 5–15)
BUN: 17 mg/dL (ref 8–23)
CO2: 24 mmol/L (ref 22–32)
Calcium: 8.7 mg/dL — ABNORMAL LOW (ref 8.9–10.3)
Chloride: 109 mmol/L (ref 98–111)
Creatinine, Ser: 1.13 mg/dL — ABNORMAL HIGH (ref 0.44–1.00)
GFR calc Af Amer: 53 mL/min — ABNORMAL LOW (ref >60)
GFR calc non Af Amer: 45 mL/min — ABNORMAL LOW (ref >60)
Glucose, Bld: 130 mg/dL — ABNORMAL HIGH (ref 70–99)
Potassium: 4.5 mmol/L (ref 3.5–5.1)
Sodium: 140 mmol/L (ref 135–145)

## 2018-01-12 SURGERY — LEFT HEART CATH AND CORS/GRAFTS ANGIOGRAPHY
Anesthesia: LOCAL

## 2018-01-12 MED ORDER — SODIUM CHLORIDE 0.9% FLUSH
3.0000 mL | INTRAVENOUS | Status: DC | PRN
  Administered 2018-01-13: 3 mL via INTRAVENOUS
  Filled 2018-01-12: qty 3

## 2018-01-12 MED ORDER — SODIUM CHLORIDE 0.9% FLUSH
3.0000 mL | Freq: Two times a day (BID) | INTRAVENOUS | Status: DC
  Administered 2018-01-12: 3 mL via INTRAVENOUS

## 2018-01-12 MED ORDER — MIDAZOLAM HCL 2 MG/2ML IJ SOLN
INTRAMUSCULAR | Status: DC | PRN
  Administered 2018-01-12: 1 mg via INTRAVENOUS

## 2018-01-12 MED ORDER — LIDOCAINE HCL (PF) 1 % IJ SOLN
INTRAMUSCULAR | Status: DC | PRN
  Administered 2018-01-12: 20 mL
  Administered 2018-01-12: 5 mL

## 2018-01-12 MED ORDER — HEPARIN (PORCINE) IN NACL 1000-0.9 UT/500ML-% IV SOLN
INTRAVENOUS | Status: DC | PRN
  Administered 2018-01-12: 500 mL

## 2018-01-12 MED ORDER — ACETAMINOPHEN 325 MG PO TABS: 650.0000 mg | ORAL_TABLET | ORAL | Status: DC | PRN

## 2018-01-12 MED ORDER — IOHEXOL 350 MG/ML SOLN
INTRAVENOUS | Status: DC | PRN
  Administered 2018-01-12: 45 mL via INTRA_ARTERIAL

## 2018-01-12 MED ORDER — HEPARIN (PORCINE) IN NACL 1000-0.9 UT/500ML-% IV SOLN
INTRAVENOUS | Status: AC
  Filled 2018-01-12: qty 500

## 2018-01-12 MED ORDER — SODIUM CHLORIDE 0.9 % IV SOLN
INTRAVENOUS | Status: AC
  Administered 2018-01-12: 15:00:00 via INTRAVENOUS

## 2018-01-12 MED ORDER — ISOSORBIDE MONONITRATE ER 60 MG PO TB24: 120.0000 mg | ORAL_TABLET | Freq: Every day | ORAL | Status: DC

## 2018-01-12 MED ORDER — FENTANYL CITRATE (PF) 100 MCG/2ML IJ SOLN
INTRAMUSCULAR | Status: DC | PRN
  Administered 2018-01-12: 25 ug via INTRAVENOUS

## 2018-01-12 MED ORDER — ONDANSETRON HCL 4 MG/2ML IJ SOLN: 4.0000 mg | Freq: Four times a day (QID) | INTRAMUSCULAR | Status: DC | PRN

## 2018-01-12 MED ORDER — LIDOCAINE HCL (PF) 1 % IJ SOLN
INTRAMUSCULAR | Status: AC
  Filled 2018-01-12: qty 30

## 2018-01-12 MED ORDER — FENTANYL CITRATE (PF) 100 MCG/2ML IJ SOLN
INTRAMUSCULAR | Status: AC
  Filled 2018-01-12: qty 2

## 2018-01-12 MED ORDER — SODIUM CHLORIDE 0.9 % IV SOLN: 250.0000 mL | INTRAVENOUS | Status: DC | PRN

## 2018-01-12 MED ORDER — MIDAZOLAM HCL 2 MG/2ML IJ SOLN
INTRAMUSCULAR | Status: AC
  Filled 2018-01-12: qty 2

## 2018-01-12 SURGICAL SUPPLY — 7 items
CATH INFINITI 5FR MULTPACK ANG (CATHETERS) ×2 IMPLANT
CLOSURE MYNX CONTROL 5F (Vascular Products) ×2 IMPLANT
KIT HEART LEFT (KITS) ×2 IMPLANT
PACK CARDIAC CATHETERIZATION (CUSTOM PROCEDURE TRAY) ×2 IMPLANT
SHEATH PINNACLE 5F 10CM (SHEATH) ×2 IMPLANT
TRANSDUCER W/STOPCOCK (MISCELLANEOUS) ×2 IMPLANT
WIRE EMERALD 3MM-J .035X150CM (WIRE) ×2 IMPLANT

## 2018-01-12 NOTE — Discharge Summary (Addendum)
Discharge Summary    Patient ID: GENEVIA BOULDIN,  MRN: [id number], DOB/AGE: 1940-10-09 77 y.o.  Admit date: 01/11/2018 Discharge date: 01/12/2018  Primary Care Provider: Mast, Man X Primary Cardiologist: Dr. Irish Lack   Discharge Diagnoses    Active Problems:   Chest pain   Allergies Allergies  Allergen Reactions  . Nsaids Other (See Comments)    Due to chronic kidney failure  . Butorphanol Other (See Comments)    agitation Constipation Produced a lot of urine, made patient feel crazy  . Sulfa Antibiotics Rash  . Bisoprolol Fumarate Other (See Comments)    Doesn't remember  . Butorphanol Tartrate Other (See Comments)    Produced a lot of urine, made patient feel crazy  . Codeine Nausea And Vomiting  . Demerol [Meperidine] Nausea And Vomiting  . Imipramine     Sweating, facial dysfunction   . Meperidine And Related Nausea And Vomiting  . Statins     MYALGIAS  . Tequin [Gatifloxacin] Other (See Comments)    Caused hypoglycemia Low blood sugar  . Brilinta [Ticagrelor] Rash    CAUSES PETECHIAE, PURPURA  . Pseudoephedrine Hcl Palpitations  . Septra [Sulfamethoxazole-Trimethoprim] Rash  . Sulfamethoxazole-Trimethoprim Rash    Diagnostic Studies/Procedures    Cath: 01/12/18   Prox LAD to Mid LAD lesion is 25% stenosed.  Ost Cx to Dist Cx lesion is 90% stenosed. SVG to OM is widely patent.  Mid RCA lesion is 90% stenosed. SVG to PDA is patent. Patent stents in distal graft.  SVG to diagonal is occluded.  Mid LAD lesion is 90% stenosed. LIMA to LAD is patent.  The left ventricular systolic function is normal.  LV end diastolic pressure is normal. LVEDP 10 mm Hg.  The left ventricular ejection fraction is 55-65% by visual estimate.  There is no aortic valve stenosis.  Continue aggressive secondary prevention. Consider discharge later today. _____________   History of Present Illness     Ms. Esty is a 77 y.o. female with PMH of polymyalgia  rheumatica, TIA, OSA on CPAP, hypothyroidism, HTN, HLD, CAD s/p CABG, CKD stage III, left subclavian artery stenosis and chronic diastolic heart failure. Patient had CABG in 2000 with SVG/free LIMA Y graft to the diagonal and distal LAD, SVG to OM1 and SVG to PDA. She underwent 2 DES to SVG to RCA in September 2016. Last cardiac catheterization in 2017 showed stable anatomy, medical therapy was recommended. She had a low risk myoview back in 2018. During her previous office visit with Dr. Irish Lack in February 2019, she continued to complain of exertional dyspnea which was chronic for her but no chest pain.   She presented to [facility] on 01/11/2018 for evaluation of left arm pain, back pain and chest pain. This had been going on for the past week. The chest pain was intermittent and last about one to 2 minutes each episode. She noticed the symptoms mostly at night when she was not doing anything. She had not noticed significant change was her baseline dyspnea on exertion either. Initial laboratory showed potassium 3.4, creatinine 1.23. Point-of-care troponin 0.02. White blood cell count since to be chronically elevated at 15. Hemoglobin was 10.8. Otherwise she denied any lower extremity edema, orthopnea or PND. Given her symptoms, she was admitted for further work up with cardiac cath.   Hospital Course     She was admitted with troponins cycled and negative x3. Given her symptoms she was set up for cardiac cath noted above  with patent LIMA-LAD, SVG-OM, SVG-PDA but occluded SVG to diag. Normal LVEDP and EF noted at 55-65%. Plan to continue with medical therapy at this time. She ambulated without difficulty and no recurrent chest pain. Home medications were continued the same without changes.    [name] was seen by Dr. Burt Knack and determined stable for discharge home. Follow up in the office has been arranged. Medications are listed below.   _____________  Discharge Vitals Blood  pressure 105/64, pulse (!) 58, temperature 97.9 F (36.6 C), temperature source Oral, resp. rate 18, height 4\' 10"  (1.473 m), weight 90.9 kg, SpO2 96 %.  Filed Weights   01/11/18 1013 01/11/18 1753 01/12/18 1500  Weight: 90.7 kg 90.6 kg 90.9 kg    Labs & Radiologic Studies    CBC Recent Labs    01/11/18 0925  WBC 15.0*  HGB 10.8*  HCT 34.3*  MCV 95.0  PLT 654   Basic Metabolic Panel Recent Labs    01/11/18 0925 01/12/18 0549  NA 139 140  K 3.4* 4.5  CL 105 109  CO2 24 24  GLUCOSE 93 130*  BUN 19 17  CREATININE 1.23* 1.13*  CALCIUM 8.9 8.7*   Liver Function Tests No results for input(s): AST, ALT, ALKPHOS, BILITOT, PROT, ALBUMIN in the last 72 hours. No results for input(s): LIPASE, AMYLASE in the last 72 hours. Cardiac Enzymes Recent Labs    01/11/18 1826 01/12/18 0007 01/12/18 0549  TROPONINI <0.03 <0.03 <0.03   BNP Invalid input(s): POCBNP D-Dimer No results for input(s): DDIMER in the last 72 hours. Hemoglobin A1C No results for input(s): HGBA1C in the last 72 hours. Fasting Lipid Panel Recent Labs    01/12/18 0549  CHOL 163  HDL 59  LDLCALC 64  TRIG 198*  CHOLHDL 2.8   Thyroid Function Tests No results for input(s): TSH, T4TOTAL, T3FREE, THYROIDAB in the last 72 hours.  Invalid input(s): FREET3 _____________  Dg Chest 2 View  Result Date: 01/11/2018 CLINICAL DATA:  Left shoulder and arm pain since last night EXAM: CHEST - 2 VIEW COMPARISON:  04/04/2017 FINDINGS: Bilateral diffuse mild interstitial thickening. No pleural effusion or pneumothorax. Stable cardiomegaly. Prior CABG. Thoracic aortic atherosclerosis. No acute osseous abnormality. IMPRESSION: Cardiomegaly with mild pulmonary vascular congestion. Electronically Signed   By: Kathreen Devoid   On: 01/11/2018 12:37   Disposition   Pt is being discharged home today in good condition.  Follow-up Plans & Appointments    Follow-up Information    Charlie Pitter, PA-C Follow up on  02/10/2018.   Specialties:  Cardiology, Radiology Why:  at [facility] for your follow up appt.  Contact information: [address] [place] [address] [phone]          Discharge Instructions    Call MD for:  redness, tenderness, or signs of infection (pain, swelling, redness, odor or green/yellow discharge around incision site)   Complete by:  As directed    Diet - low sodium heart healthy   Complete by:  As directed    Discharge instructions   Complete by:  As directed    Groin Site Care Refer to this sheet in the next few weeks. These instructions provide you with information on caring for yourself after your procedure. Your caregiver may also give you more specific instructions. Your treatment has been planned according to current medical practices, but problems sometimes occur. Call your caregiver if you have any problems or questions after your procedure. HOME CARE  INSTRUCTIONS You may shower 24 hours after the procedure. Remove the bandage (dressing) and gently wash the site with plain soap and water. Gently pat the site dry.  Do not apply powder or lotion to the site.  Do not sit in a bathtub, swimming pool, or whirlpool for 5 to 7 days.  No bending, squatting, or lifting anything over 10 pounds (4.5 kg) as directed by your caregiver.  Inspect the site at least twice daily.  Do not drive home if you are discharged the same day of the procedure. Have someone else drive you.  You may drive 24 hours after the procedure unless otherwise instructed by your caregiver.  What to expect: Any bruising will usually fade within 1 to 2 weeks.  Blood that collects in the tissue (hematoma) may be painful to the touch. It should usually decrease in size and tenderness within 1 to 2 weeks.  SEEK IMMEDIATE MEDICAL CARE IF: You have unusual pain at the groin site or down the affected leg.  You have redness, warmth, swelling, or pain at the groin site.  You have drainage  (other than a small amount of blood on the dressing).  You have chills.  You have a fever or persistent symptoms for more than 72 hours.  You have a fever and your symptoms suddenly get worse.  Your leg becomes pale, cool, tingly, or numb.  You have heavy bleeding from the site. Hold pressure on the site. .   Increase activity slowly   Complete by:  As directed        Discharge Medications     Medication List    TAKE these medications   alendronate 70 MG tablet Commonly known as:  FOSAMAX Take 70 mg by mouth once a week. Take with a full glass of water on an empty stomach.   amLODipine 5 MG tablet Commonly known as:  NORVASC TAKE 1 TABLET BY MOUTH DAILY.   atorvastatin 10 MG tablet Commonly known as:  LIPITOR TAKE 1 TABLET ONCE DAILY. What changed:  when to take this   Cinnamon 500 MG Tabs Take 1 each by mouth daily.   clopidogrel 75 MG tablet Commonly known as:  PLAVIX TAKE 1 TABLET ONCE DAILY.   Coenzyme Q10 10 MG capsule Take 10 mg by mouth at bedtime.   fluticasone 50 MCG/ACT nasal spray Commonly known as:  FLONASE Place 2 sprays into both nostrils daily as needed for allergies.   furosemide 20 MG tablet Commonly known as:  LASIX Take 1 tablet (20 mg total) by mouth daily. What changed:  additional instructions   GLUCOSAMINE 1500 COMPLEX Caps Take 2 capsules by mouth daily after breakfast.   isosorbide mononitrate 60 MG 24 hr tablet Commonly known as:  IMDUR Take 2 tablets (120 mg total) by mouth daily.   levocetirizine 5 MG tablet Commonly known as:  XYZAL Take 5 mg by mouth every evening.   levothyroxine 75 MCG tablet Commonly known as:  SYNTHROID, LEVOTHROID Take 75 mcg by mouth daily before breakfast.   Melatonin 1 MG Tabs Take 1 mg by mouth as needed (sleep).   metoprolol succinate 100 MG 24 hr tablet Commonly known as:  TOPROL-XL TAKE 1 TABLET DAILY WITH OR IMMEDIATELY FOLLOWING A MEAL. What changed:  See the new instructions.     MYRBETRIQ 50 MG Tb24 tablet Generic drug:  mirabegron ER Take 50 mg by mouth at bedtime.   nitroGLYCERIN 0.4 MG SL tablet Commonly known as:  NITROSTAT Place 0.4  mg under the tongue every 5 (five) minutes as needed for chest pain. X 3 doses   NONFORMULARY OR COMPOUNDED ITEM [facility]:  Achilles Tendonitis Cream - Diclofenac 3%, Baclofen 2%, Bupivacaine 1%, GAbapentin 6%, Ibuprofen 3%, Pentoxifylline 3%, apply 1-2 grams to affected area 3-4 times daily. What changed:    when to take this  reasons to take this   predniSONE 5 MG tablet Commonly known as:  DELTASONE Take 7.5 mg by mouth daily. with food or milk by mouth once daily.   PSYLLIUM PO Take 6 capsules by mouth at bedtime.   RANEXA 1000 MG SR tablet Generic drug:  ranolazine TAKE 1 TABLET BY MOUTH TWICE DAILY. What changed:  how much to take   ranitidine 150 MG capsule Commonly known as:  ZANTAC Take 150 mg by mouth 2 (two) times daily.   venlafaxine XR 75 MG 24 hr capsule Commonly known as:  EFFEXOR-XR Take 225 mg by mouth daily with breakfast. Patient takes 3 capsules   VITAMIN D PO Take 2,000 Units by mouth daily.       Acute coronary syndrome (MI, NSTEMI, STEMI, etc) this admission?: No.     Outstanding Labs/Studies   N/a   Duration of Discharge Encounter   Greater than 30 minutes including physician time.  Signed, Reino Bellis NP-C 01/12/2018, 4:45 PM  Patient seen, examined. Available data reviewed. Agree with findings, assessment, and plan as outlined by Reino Bellis, NP.  Patient is independently interviewed and examined.  She is an obese elderly woman in no distress.  JVP is normal, lung fields are clear, heart is regular rate and rhythm with no murmur gallop, abdomen is soft, obese, nontender, right groin site is clear, and there is no pretibial edema.  Cardiac catheterization yesterday demonstrated continued patency of her bypass grafts with stable coronary anatomy.  Her medical  program is reviewed as outlined above and no changes are recommended.  Outpatient follow-up will be arranged.  The patient is stable for discharge from the hospital today.  Her husband is at the bedside and his questions are answered.  They have requested a double bed rail at their assisted living facility because it is difficult for her to get up out of bed.  Sherren Mocha, M.D. 01/13/2018 11:18 AM

## 2018-01-12 NOTE — Progress Notes (Signed)
   Nursing staff inquired about pt code status as she has a yellow DNR form. I asked the patient about her status during this hospitalization and she says that since she is fully functional, walking around, she would like to have life saving measures including CPR while she is here. I will leave her as full code status.   Daune Perch, AGNP-C [facility] 01/12/2018  7:45 AM Pager: [phone]

## 2018-01-12 NOTE — Progress Notes (Signed)
Pt arrived to the unit, rt groin level 0 dressing in place CDI. Pt sleeping however AAOX3, husband notified of move to unit. VSS

## 2018-01-12 NOTE — Interval H&P Note (Signed)
Cath Lab Visit (complete for each Cath Lab visit)  Clinical Evaluation Leading to the Procedure:   ACS: Yes.    Non-ACS:    Anginal Classification: CCS IV  Anti-ischemic medical therapy: Minimal Therapy (1 class of medications)  Non-Invasive Test Results: No non-invasive testing performed  Prior CABG: Previous CABG      History and Physical Interval Note:  01/12/2018 11:12 AM  Erin Ingram  has presented today for surgery, with the diagnosis of ua  The various methods of treatment have been discussed with the patient and family. After consideration of risks, benefits and other options for treatment, the patient has consented to  Procedure(s): LEFT HEART CATH AND CORONARY ANGIOGRAPHY (N/A) as a surgical intervention .  The patient's history has been reviewed, patient examined, no change in status, stable for surgery.  I have reviewed the patient's chart and labs.  Questions were answered to the patient's satisfaction.     Larae Grooms

## 2018-01-12 NOTE — Progress Notes (Addendum)
Progress Note  Patient Name: Erin Ingram Date of Encounter: 01/12/2018  Primary Cardiologist: Larae Grooms, MD  Subjective   No chest pain this morning. Planned for cardiac cath today.   Inpatient Medications    Scheduled Meds: . amLODipine  5 mg Oral Daily  . atorvastatin  10 mg Oral q1800  . clopidogrel  75 mg Oral Daily  . heparin  5,000 Units Subcutaneous Q8H  . isosorbide mononitrate  120 mg Oral Daily  . levothyroxine  75 mcg Oral Q0600  . metoprolol succinate  100 mg Oral Daily  . mirabegron ER  50 mg Oral QHS  . predniSONE  7.5 mg Oral Daily  . ranolazine  1,000 mg Oral BID  . sodium chloride flush  3 mL Intravenous Q12H  . venlafaxine XR  225 mg Oral Q breakfast   Continuous Infusions: . sodium chloride    . sodium chloride 1 mL/kg/hr (01/12/18 0506)   PRN Meds: sodium chloride, acetaminophen, nitroGLYCERIN, ondansetron (ZOFRAN) IV, sodium chloride flush   Vital Signs    Vitals:   01/11/18 1753 01/11/18 1754 01/12/18 0022 01/12/18 0556  BP:  116/70 (!) 111/44 (!) 134/55  Pulse:  65 60 64  Resp:    18  Temp:  (!) 97.4 F (36.3 C) 99.1 F (37.3 C) 98.7 F (37.1 C)  TempSrc:  Oral Oral Oral  SpO2:  96% 93% 95%  Weight: 90.6 kg     Height:        Intake/Output Summary (Last 24 hours) at 01/12/2018 0851 Last data filed at 01/12/2018 0506 Gross per 24 hour  Intake 261.5 ml  Output -  Net 261.5 ml   Filed Weights   01/11/18 0903 01/11/18 1013 01/11/18 1753  Weight: 90.5 kg 90.7 kg 90.6 kg    Telemetry    SR - Personally Reviewed  ECG    SR - Personally Reviewed  Physical Exam   General: Well developed, well nourished, female appearing in no acute distress. Head: Normocephalic, atraumatic.  Neck: Supple without bruits, JVD. Lungs:  Resp regular and unlabored, CTA. Heart: RRR, S1, S2, no murmur; no rub. Abdomen: Soft, non-tender, non-distended with normoactive bowel sounds.  Extremities: No clubbing, cyanosis, edema. Distal  pedal pulses are 2+ bilaterally. Neuro: Alert and oriented X 3. Moves all extremities spontaneously. Psych: Normal affect.  Labs    Chemistry Recent Labs  Lab 01/11/18 0925 01/12/18 0549  NA 139 140  K 3.4* 4.5  CL 105 109  CO2 24 24  GLUCOSE 93 130*  BUN 19 17  CREATININE 1.23* 1.13*  CALCIUM 8.9 8.7*  GFRNONAA 41* 45*  GFRAA 47* 53*  ANIONGAP 10 7     Hematology Recent Labs  Lab 01/11/18 0925  WBC 15.0*  RBC 3.61*  HGB 10.8*  HCT 34.3*  MCV 95.0  MCH 29.9  MCHC 31.5  RDW 13.7  PLT 255    Cardiac Enzymes Recent Labs  Lab 01/11/18 1826 01/12/18 0007 01/12/18 0549  TROPONINI <0.03 <0.03 <0.03    Recent Labs  Lab 01/11/18 0938  TROPIPOC 0.02     BNPNo results for input(s): BNP, PROBNP in the last 168 hours.   DDimer No results for input(s): DDIMER in the last 168 hours.    Radiology    Dg Chest 2 View  Result Date: 01/11/2018 CLINICAL DATA:  Left shoulder and arm pain since last night EXAM: CHEST - 2 VIEW COMPARISON:  04/04/2017 FINDINGS: Bilateral diffuse mild interstitial thickening. No pleural effusion or  pneumothorax. Stable cardiomegaly. Prior CABG. Thoracic aortic atherosclerosis. No acute osseous abnormality. IMPRESSION: Cardiomegaly with mild pulmonary vascular congestion. Electronically Signed   By: Kathreen Devoid   On: 01/11/2018 12:37    Cardiac Studies   N/a   Patient Profile     77 y.o. female with PMH of polymyalgia rheumatica, TIA, OSA on CPAP, hypothyroidism, HTN, HLD, CAD s/p CABG, CKD stage III, left subclavian artery stenosis and chronic diastolic heart failure who presented with chest pain. Admitted with plans for cardiac cath.   Assessment & Plan    1. Chest pain: Intermittent for the past week. No exacerbating or relieving factors. No clear correlation with exertion. However the left arm pain and back pain is similar to the previous angina. Given her symptoms and CABG hx planned for cath today. Troponins neg x3.  -- The  patient understands that risks included but are not limited to stroke (1 in 1000), death (1 in 1000), kidney failure [usually temporary] (1 in 500), bleeding (1 in 200), allergic reaction [possibly serious] (1 in 200).   2. CAD s/p CABG in 2000: on Plavix monotherapy at home. He has been compliant with her medication other than missing one time morning.  3. polymyalgia rheumatica: on chronic steroid  4. h/o TIA: no recent recurrence   5. OSA on CPAP: reports compliance   6. Hypothyroidism: managed by primary care provider  7. HTN: stable with current therapy.   8. HLD: Continue Lipitor  Signed, Reino Bellis, NP  01/12/2018, 8:51 AM  Pager # [phone]   For questions or updates, please contact [place] Please consult [email or web address] for contact info under Cardiology/STEMI.    Patient seen, examined. Available data reviewed. Agree with findings, assessment, and plan as outlined by Reino Bellis, NP.  The patient is independently interviewed and examined.  She is an elderly woman in no distress.  Lung fields are clear, heart is regular rate and rhythm, abdomen is soft and obese, nontender.  Extremities show no edema.  The right groin site is clear.  Cardiac catheterization results reviewed with patent bypass grafts to all major coronary territories.  Reviewed findings with patient.  She is having no recurrent symptoms of chest pain or shortness of breath.  Anticipate discharge home tomorrow morning.  Sherren Mocha, M.D. 01/12/2018 6:33 PM

## 2018-01-13 DIAGNOSIS — I130 Hypertensive heart and chronic kidney disease with heart failure and stage 1 through stage 4 chronic kidney disease, or unspecified chronic kidney disease: Secondary | ICD-10-CM | POA: Diagnosis not present

## 2018-01-13 DIAGNOSIS — I5032 Chronic diastolic (congestive) heart failure: Secondary | ICD-10-CM | POA: Diagnosis not present

## 2018-01-13 DIAGNOSIS — Z951 Presence of aortocoronary bypass graft: Secondary | ICD-10-CM | POA: Diagnosis not present

## 2018-01-13 DIAGNOSIS — I25119 Atherosclerotic heart disease of native coronary artery with unspecified angina pectoris: Secondary | ICD-10-CM | POA: Diagnosis not present

## 2018-01-13 NOTE — Clinical Social Work Note (Signed)
Clinical Social Work Assessment  Patient Details  Name: Erin Ingram MRN: [id number] Date of Birth: 1940/12/11  Date of referral:  01/13/18               Reason for consult:  Discharge Planning                Permission sought to share information with:  Chartered certified accountant granted to share information::  Yes, Verbal Permission Granted  Name::        Agency::  [facility]  Relationship::     Contact Information:     Housing/Transportation Living arrangements for the past 2 months:  [place] of Information:  Patient, Medical Team, Facility Patient Interpreter Needed:  None Criminal Activity/Legal Involvement Pertinent to Current Situation/Hospitalization:  No - Comment as needed Significant Relationships:  Spouse, Adult Children Lives with:  Spouse Do you feel safe going back to the place where you live?  Yes Need for family participation in patient care:  Yes (Comment)  Care giving concerns:  Patient is a resident at [facility] [place].   Social Worker assessment / plan:  CSW met with patient. No supports at bedside. CSW introduced role and explained that discharge planning would be discussed. Patient confirmed she is a resident at [facility]. Patient stated she does not need any extra assistance at discharge and feels safe returning to [place]. ILF staff member, Raquel Sarna, notified. No further concerns. CSW signing off as social work intervention is no longer needed.  Employment status:  Retired Nurse, adult PT Recommendations:  Not assessed at this time Information / Referral to community resources:  Other (Comment Required)(Plan to return to ILF.)  Patient/Family's Response to care:  Patient agreeable to return to ILF. Patient's family supportive and involved in patient's care. Patient appreciated social work intervention.  Patient/Family's Understanding of and  Emotional Response to Diagnosis, Current Treatment, and Prognosis:  Patient has a good understanding of the reason for admission and social work consult. Patient appears happy with hospital care.  Emotional Assessment Appearance:  Appears stated age Attitude/Demeanor/Rapport:  Engaged, Gracious Affect (typically observed):  Accepting, Appropriate, Calm, Pleasant Orientation:  Oriented to Self, Oriented to Place, Oriented to  Time, Oriented to Situation Alcohol / Substance use:  Never Used Psych involvement (Current and /or in the community):  No (Comment)  Discharge Needs  Concerns to be addressed:  Care Coordination Readmission within the last 30 days:  No Current discharge risk:  None Barriers to Discharge:  No Barriers Identified   Candie Chroman, LCSW 01/13/2018, 9:54 AM

## 2018-01-21 ENCOUNTER — Other Ambulatory Visit: Payer: Self-pay | Admitting: *Deleted

## 2018-01-21 DIAGNOSIS — E039 Hypothyroidism, unspecified: Secondary | ICD-10-CM

## 2018-01-21 DIAGNOSIS — I10 Essential (primary) hypertension: Secondary | ICD-10-CM

## 2018-01-21 DIAGNOSIS — R7301 Impaired fasting glucose: Secondary | ICD-10-CM

## 2018-01-21 DIAGNOSIS — D649 Anemia, unspecified: Secondary | ICD-10-CM

## 2018-01-24 ENCOUNTER — Other Ambulatory Visit: Payer: Self-pay

## 2018-01-24 DIAGNOSIS — R7301 Impaired fasting glucose: Secondary | ICD-10-CM

## 2018-01-24 DIAGNOSIS — E039 Hypothyroidism, unspecified: Secondary | ICD-10-CM

## 2018-01-24 DIAGNOSIS — I10 Essential (primary) hypertension: Secondary | ICD-10-CM

## 2018-01-24 DIAGNOSIS — D649 Anemia, unspecified: Secondary | ICD-10-CM

## 2018-01-25 ENCOUNTER — Other Ambulatory Visit: Payer: Medicare Other

## 2018-01-26 LAB — TEST AUTHORIZATION

## 2018-01-26 LAB — CBC WITH DIFFERENTIAL/PLATELET
Basophils Absolute: 43 cells/uL (ref 0–200)
Basophils Relative: 0.3 %
Eosinophils Absolute: 144 cells/uL (ref 15–500)
Eosinophils Relative: 1 %
HCT: 34.3 % — ABNORMAL LOW (ref 35.0–45.0)
Hemoglobin: 11.2 g/dL — ABNORMAL LOW (ref 11.7–15.5)
Lymphs Abs: 3110 cells/uL (ref 850–3900)
MCH: 29.7 pg (ref 27.0–33.0)
MCHC: 32.7 g/dL (ref 32.0–36.0)
MCV: 91 fL (ref 80.0–100.0)
MPV: 9.4 fL (ref 7.5–12.5)
Monocytes Relative: 5.9 %
Neutro Abs: 10253 cells/uL — ABNORMAL HIGH (ref 1500–7800)
Neutrophils Relative %: 71.2 %
Platelets: 303 10*3/uL (ref 140–400)
RBC: 3.77 10*6/uL — ABNORMAL LOW (ref 3.80–5.10)
RDW: 13.2 % (ref 11.0–15.0)
Total Lymphocyte: 21.6 %
WBC mixed population: 850 cells/uL (ref 200–950)
WBC: 14.4 10*3/uL — ABNORMAL HIGH (ref 3.8–10.8)

## 2018-01-26 LAB — HEMOGLOBIN A1C
Hgb A1c MFr Bld: 5.6 % of total Hgb (ref <5.7)
Mean Plasma Glucose: 114 (calc)
eAG (mmol/L): 6.3 (calc)

## 2018-01-26 LAB — TSH

## 2018-01-26 LAB — COMPLETE METABOLIC PANEL WITH GFR

## 2018-01-27 ENCOUNTER — Non-Acute Institutional Stay: Payer: Medicare Other | Admitting: Nurse Practitioner

## 2018-01-27 ENCOUNTER — Encounter: Payer: Self-pay | Admitting: Nurse Practitioner

## 2018-01-27 DIAGNOSIS — E785 Hyperlipidemia, unspecified: Secondary | ICD-10-CM

## 2018-01-27 DIAGNOSIS — R7301 Impaired fasting glucose: Secondary | ICD-10-CM | POA: Diagnosis not present

## 2018-01-27 DIAGNOSIS — N318 Other neuromuscular dysfunction of bladder: Secondary | ICD-10-CM | POA: Diagnosis not present

## 2018-01-27 DIAGNOSIS — I2510 Atherosclerotic heart disease of native coronary artery without angina pectoris: Secondary | ICD-10-CM | POA: Diagnosis not present

## 2018-01-27 DIAGNOSIS — G4733 Obstructive sleep apnea (adult) (pediatric): Secondary | ICD-10-CM

## 2018-01-27 DIAGNOSIS — I10 Essential (primary) hypertension: Secondary | ICD-10-CM

## 2018-01-27 DIAGNOSIS — I5030 Unspecified diastolic (congestive) heart failure: Secondary | ICD-10-CM

## 2018-01-27 DIAGNOSIS — E039 Hypothyroidism, unspecified: Secondary | ICD-10-CM

## 2018-01-27 DIAGNOSIS — D72829 Elevated white blood cell count, unspecified: Secondary | ICD-10-CM

## 2018-01-27 DIAGNOSIS — M353 Polymyalgia rheumatica: Secondary | ICD-10-CM

## 2018-01-27 DIAGNOSIS — K219 Gastro-esophageal reflux disease without esophagitis: Secondary | ICD-10-CM

## 2018-01-27 DIAGNOSIS — F339 Major depressive disorder, recurrent, unspecified: Secondary | ICD-10-CM

## 2018-01-27 NOTE — Assessment & Plan Note (Signed)
Blood pressure is in control, continue Metoprolol 100mg  qd, Amlodipine 5mg  qd.

## 2018-01-27 NOTE — Assessment & Plan Note (Signed)
Continue f/u Cardiology. Continue Ranexa 1000mg  bid, Isosorbide 120mg  qd, NTG prn, Plavix 75mg  qd, Lipitor 10mg  qd.

## 2018-01-27 NOTE — Assessment & Plan Note (Signed)
Continue Levothyroxine 56mcg qd, update TSH

## 2018-01-27 NOTE — Assessment & Plan Note (Signed)
Continue Atorvastatin 10mg  qd

## 2018-01-27 NOTE — Assessment & Plan Note (Signed)
Stable, continue Effexor 75mg qd. 

## 2018-01-27 NOTE — Progress Notes (Signed)
Location:   clinic [place]   Place of Service:  Clinic (12)clinic [facility]   Provider: Marlana Latus NP  Code Status: DNR Goals of Care: [place] 01/11/2018  Does Patient Have a Medical Advance Directive? Yes  Type of Advance Directive Out of facility DNR (pink MOST or yellow form)  Does patient want to make changes to medical advance directive? No - Patient declined  Copy of [place] in Chart? -  Would patient like information on creating a medical advance directive? No - Patient declined  Pre-existing out of facility DNR order (yellow form or pink MOST form) Yellow form placed in chart (order not valid for inpatient use);Pink MOST form placed in chart (order not valid for inpatient use)     Chief Complaint  Patient presents with  . Hospitalization Follow-up    [facility] on Sunday,    HPI: Patient is a 77 y.o. female seen today for medical management of chronic diseases.     The patient was hospitalized 01/11/18-01/12/18 for left arm pain, back pain, chest pain. Had cardiac  cath 01/12/18 showed:   Prox LAD to Mid LAD lesion is 25% stenosed.  Ost Cx to Dist Cx lesion is 90% stenosed. SVG to OM is widely patent.  Mid RCA lesion is 90% stenosed. SVG to PDA is patent. Patent stents in distal graft.  SVG to diagonal is occluded.  Mid LAD lesion is 90% stenosed. LIMA to LAD is patent.  The left ventricular systolic function is normal.  LV end diastolic pressure is normal. LVEDP 10 mm Hg.  The left ventricular ejection fraction is 55-65% by visual estimate.There is no aortic valve stenosis.  01/11/18 CXR mild pulmonary vascular congestion  Hx of polymyalgia rheumatica, on Prednisone 7.5mg  qd. TIA, OSA on CPAP, hypothyroidism, on Levothyroxine 30mcg, last TSH-not recnet.  HTN, on Metoprolol 100mg  qd and Amlodipine 5mg  qd.  CAD, on Ranexa 1000mg  bid, Isosorbide 120mg  qd, NTG prn, Plavix 75mg  qd, Lipitor 10mg  qd, s/p CABG(2000 with SVG/free LIMA Y graft to  the diagonal and distal LAD, SVG to OM1 and SVG to PDA. 2 DES to SVG to RCA 11/2014, had cardiac catheterization 2017, Myoview 2018), CKD stage III, left subclavian artery stenosis, chronic CHF, stable on Furosemide 20mg  qd, 20mg  prn, depression, on Effexor 75mg  qd. GERD, on Ranitidine 150mg  bid. 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 Osteoporosis, stable on Alendronate 70mg  weekly.  Past Medical History:  Diagnosis Date  . (HFpEF) heart failure with preserved ejection fraction (HCC)    a. normal EF, LVEDP [facility] in 6/17 33 >> Lasix started   . Anemia   . Arthritis    "fingers, back, shoulders, hips" (04/10/2016)  . Chronic diastolic CHF (congestive heart failure) ([place])   . CKD (chronic kidney disease), stage III ([place])   . Coronary artery disease    a. s/p CABG 2000 (SVG/ free LIMA Y graft to the diagonal and distal LAD, SVG to the OM 1, and SVG to the PDA) . b. Cath 12/19/2014 90% dSVG to RCA s/p 2 overlapping DES. c. LHC 2016, 2017 no intervention except diuresis needed. d. Low risk nuc 03/2016.  . Depression    with anxious component  . GERD (gastroesophageal reflux disease)   . History of hiatal hernia   . Hyperlipidemia   . Hypothyroidism   . Obesity   . OSA on CPAP   . PMR (polymyalgia rheumatica) (HCC)   . Pneumonia    "2-3 times" (04/10/2016)  . Stable  angina ([place])    microvascular, improved with Ranexa  . Subclavian artery stenosis ([place])   . TIA (transient ischemic attack)     Past Surgical History:  Procedure Laterality Date  . BREAST BIOPSY Right 1964  . CARDIAC CATHETERIZATION  08   patent grafts, no culprit lesions, EF 65%  . CARDIAC CATHETERIZATION N/A 12/19/2014   Procedure: Left Heart Cath and Cors/Grafts Angiography;  Surgeon: Jettie Booze, MD;  Location: [place] [facility];  Service: Cardiovascular;  Laterality: N/A;  . CARDIAC CATHETERIZATION N/A 12/19/2014   Procedure: Coronary Stent Intervention;  Surgeon: Jettie Booze, MD;  Location: [place] [facility];  Service:  Cardiovascular;  Laterality: N/A;  . CARDIAC CATHETERIZATION N/A 01/01/2015   Procedure: Left Heart Cath and Cors/Grafts Angiography;  Surgeon: Jettie Booze, MD;  Location: [place] [facility];  Service: Cardiovascular;  Laterality: N/A;  . CARDIAC CATHETERIZATION N/A 09/20/2015   Procedure: Left Heart Cath and Cors/Grafts Angiography;  Surgeon: Jettie Booze, MD;  Location: [place] [facility];  Service: Cardiovascular;  Laterality: N/A;  . CATARACT EXTRACTION W/ INTRAOCULAR LENS  IMPLANT, BILATERAL Bilateral 2011  . COLONOSCOPY WITH PROPOFOL N/A 07/27/2016   Procedure: COLONOSCOPY WITH PROPOFOL;  Surgeon: Wilford Corner, MD;  Location: [place];  Service: Endoscopy;  Laterality: N/A;  . CORONARY ARTERY BYPASS GRAFT  2000   ASCVD, multivessel, S./P.  . DILATION AND CURETTAGE OF UTERUS  1980s  . ESOPHAGOGASTRODUODENOSCOPY (EGD) WITH PROPOFOL N/A 07/27/2016   Procedure: ESOPHAGOGASTRODUODENOSCOPY (EGD) WITH PROPOFOL;  Surgeon: Wilford Corner, MD;  Location: [place];  Service: Endoscopy;  Laterality: N/A;  . FRACTURE SURGERY    . LEFT HEART CATH AND CORS/GRAFTS ANGIOGRAPHY N/A 01/12/2018   Procedure: LEFT HEART CATH AND CORS/GRAFTS ANGIOGRAPHY;  Surgeon: Jettie Booze, MD;  Location: [place] [facility];  Service: Cardiovascular;  Laterality: N/A;  . PATELLA FRACTURE SURGERY Left 1993    Allergies  Allergen Reactions  . Nsaids Other (See Comments)    Due to chronic kidney failure  . Butorphanol Other (See Comments)    agitation Constipation Produced a lot of urine, made patient feel crazy  . Sulfa Antibiotics Rash  . Bisoprolol Fumarate Other (See Comments)    Doesn't remember  . Butorphanol Tartrate Other (See Comments)    Produced a lot of urine, made patient feel crazy  . Codeine Nausea And Vomiting  . Demerol [Meperidine] Nausea And Vomiting  . Imipramine     Sweating, facial dysfunction   . Meperidine And Related Nausea And Vomiting  . Statins      MYALGIAS  . Tequin [Gatifloxacin] Other (See Comments)    Caused hypoglycemia Low blood sugar  . Brilinta [Ticagrelor] Rash    CAUSES PETECHIAE, PURPURA  . Pseudoephedrine Hcl Palpitations  . Septra [Sulfamethoxazole-Trimethoprim] Rash  . Sulfamethoxazole-Trimethoprim Rash    Allergies as of 01/27/2018      Reactions   Nsaids Other (See Comments)   Due to chronic kidney failure   Butorphanol Other (See Comments)   agitation Constipation Produced a lot of urine, made patient feel crazy   Sulfa Antibiotics Rash   Bisoprolol Fumarate Other (See Comments)   Doesn't remember   Butorphanol Tartrate Other (See Comments)   Produced a lot of urine, made patient feel crazy   Codeine Nausea And Vomiting   Demerol [meperidine] Nausea And Vomiting   Imipramine    Sweating, facial dysfunction    Meperidine And Related Nausea And Vomiting   Statins  MYALGIAS   Tequin [gatifloxacin] Other (See Comments)   Caused hypoglycemia Low blood sugar   Brilinta [ticagrelor] Rash   CAUSES PETECHIAE, PURPURA   Pseudoephedrine Hcl Palpitations   Septra [sulfamethoxazole-trimethoprim] Rash   Sulfamethoxazole-trimethoprim Rash      Medication List        Accurate as of 01/27/18 11:59 PM. Always use your most recent med list.          alendronate 70 MG tablet Commonly known as:  FOSAMAX Take 70 mg by mouth once a week. Take with a full glass of water on an empty stomach.   amLODipine 5 MG tablet Commonly known as:  NORVASC TAKE 1 TABLET BY MOUTH DAILY.   atorvastatin 10 MG tablet Commonly known as:  LIPITOR TAKE 1 TABLET ONCE DAILY.   Cinnamon 500 MG Tabs Take 1 each by mouth daily.   clopidogrel 75 MG tablet Commonly known as:  PLAVIX TAKE 1 TABLET ONCE DAILY.   Coenzyme Q10 10 MG capsule Take 10 mg by mouth at bedtime.   fluticasone 50 MCG/ACT nasal spray Commonly known as:  FLONASE Place 2 sprays into both nostrils daily as needed for allergies.   furosemide 20 MG  tablet Commonly known as:  LASIX Take 1 tablet (20 mg total) by mouth daily.   GLUCOSAMINE 1500 COMPLEX Caps Take 2 capsules by mouth daily after breakfast.   isosorbide mononitrate 60 MG 24 hr tablet Commonly known as:  IMDUR Take 2 tablets (120 mg total) by mouth daily.   levocetirizine 5 MG tablet Commonly known as:  XYZAL Take 5 mg by mouth every evening.   levothyroxine 75 MCG tablet Commonly known as:  SYNTHROID, LEVOTHROID Take 75 mcg by mouth daily before breakfast.   Melatonin 1 MG Tabs Take 1 mg by mouth as needed (sleep).   metoprolol succinate 100 MG 24 hr tablet Commonly known as:  TOPROL-XL TAKE 1 TABLET DAILY WITH OR IMMEDIATELY FOLLOWING A MEAL.   MYRBETRIQ 50 MG Tb24 tablet Generic drug:  mirabegron ER Take 50 mg by mouth at bedtime.   nitroGLYCERIN 0.4 MG SL tablet Commonly known as:  NITROSTAT Place 0.4 mg under the tongue every 5 (five) minutes as needed for chest pain. X 3 doses   NONFORMULARY OR COMPOUNDED ITEM [facility]:  [place] Tendonitis Cream - Diclofenac 3%, Baclofen 2%, Bupivacaine 1%, GAbapentin 6%, Ibuprofen 3%, Pentoxifylline 3%, apply 1-2 grams to affected area 3-4 times daily.   predniSONE 5 MG tablet Commonly known as:  DELTASONE Take 7.5 mg by mouth daily. with food or milk by mouth once daily.   PSYLLIUM PO Take 6 capsules by mouth at bedtime.   RANEXA 1000 MG SR tablet Generic drug:  ranolazine TAKE 1 TABLET BY MOUTH TWICE DAILY.   ranitidine 150 MG capsule Commonly known as:  ZANTAC Take 150 mg by mouth 2 (two) times daily.   venlafaxine XR 75 MG 24 hr capsule Commonly known as:  EFFEXOR-XR Take 225 mg by mouth daily with breakfast. Patient takes 3 capsules   VITAMIN D PO Take 2,000 Units by mouth daily.       Review of Systems:  Review of Systems  Constitutional: Negative for activity change, appetite change, chills, diaphoresis, fatigue and fever.  HENT: Positive for hearing loss. Negative for  congestion and voice change.   Respiratory: Positive for shortness of breath. Negative for cough and wheezing.        DOE  Cardiovascular: Positive for chest pain and leg swelling. Negative for  palpitations.  Gastrointestinal: Negative for abdominal distention, abdominal pain, constipation, diarrhea, nausea and vomiting.  Genitourinary: Positive for frequency. Negative for difficulty urinating, dysuria and urgency.  Musculoskeletal: Positive for gait problem and myalgias.       Lost balance 5 days ago, bruises arms.   Neurological: Positive for tremors. Negative for dizziness, speech difficulty, weakness and headaches.  Hematological: Bruises/bleeds easily.  Psychiatric/Behavioral: Positive for sleep disturbance. Negative for agitation, behavioral problems and hallucinations. The patient is not nervous/anxious.     Health Maintenance  Topic Date Due  . TETANUS/TDAP  04/20/2022  . INFLUENZA VACCINE  Completed  . DEXA SCAN  Completed  . PNA vac Low Risk Adult  Completed    Physical Exam: Vitals:   01/27/18 1525  BP: 120/88  Pulse: 76  Resp: (!) 22  Temp: 97.7 F (36.5 C)  TempSrc: Oral  SpO2: 96%  Weight: 200 lb 12.8 oz (91.1 kg)  Height: 4\' 10"  (1.473 m)   Body mass index is 41.97 kg/m. Physical Exam  Constitutional: She is oriented to person, place, and time. She appears well-developed and well-nourished.  HENT:  Head: Normocephalic and atraumatic.  Eyes: Pupils are equal, round, and reactive to light. EOM are normal.  Neck: Normal range of motion. Neck supple. No JVD present. No thyromegaly present.  Cardiovascular: Normal rate and regular rhythm.  No murmur heard. Pulmonary/Chest: Effort normal. She has no wheezes. She has no rales.  Abdominal: Soft. She exhibits no distension. There is no tenderness. There is no rebound and no guarding.  Musculoskeletal: She exhibits edema.  Trace edema BLE. Ambulates with walker.   Neurological: She is alert and oriented to person,  place, and time. No cranial nerve deficit. She exhibits normal muscle tone. Coordination normal.  Skin: Skin is warm and dry.  Bruises BLE arms  Psychiatric: She has a normal mood and affect. Her behavior is normal.    Labs reviewed: Basic Metabolic Panel: Recent Labs    06/24/17 0715 01/11/18 0925 01/12/18 0549 01/24/18 1042  NA 143 139 140  --   K 4.3 3.4* 4.5  --   CL 106 105 109  --   CO2 27 24 24   --   GLUCOSE 90 93 130* CANCELED  BUN 25 19 17   --   CREATININE 1.18* 1.23* 1.13*  --   CALCIUM 9.4 8.9 8.7*  --   TSH 2.56  --   --  CANCELED   Liver Function Tests: Recent Labs    06/24/17 0715  AST 10  ALT 8  BILITOT 0.3  PROT 5.8*   No results for input(s): LIPASE, AMYLASE in the last 8760 hours. No results for input(s): AMMONIA in the last 8760 hours. CBC: Recent Labs    04/04/17 1935 06/24/17 0715 01/11/18 0925 01/24/18 1042  WBC 12.2* 15.4* 15.0* 14.4*  NEUTROABS 10.6*  --   --  10,253*  HGB 10.7* 10.8* 10.8* 11.2*  HCT 33.4* 31.7* 34.3* 34.3*  MCV 92.8 90.1 95.0 91.0  PLT 247 289 255 303   Lipid Panel: Recent Labs    06/24/17 0715 01/12/18 0549  CHOL 156 163  HDL 66 59  LDLCALC 71 64  TRIG 111 198*  CHOLHDL 2.4 2.8   Lab Results  Component Value Date   HGBA1C 5.6 01/24/2018    Procedures since last visit: Dg Chest 2 View  Result Date: 01/11/2018 CLINICAL DATA:  Left shoulder and arm pain since last night EXAM: CHEST - 2 VIEW COMPARISON:  04/04/2017 FINDINGS:  Bilateral diffuse mild interstitial thickening. No pleural effusion or pneumothorax. Stable cardiomegaly. Prior CABG. Thoracic aortic atherosclerosis. No acute osseous abnormality. IMPRESSION: Cardiomegaly with mild pulmonary vascular congestion. Electronically Signed   By: Kathreen Devoid   On: 01/11/2018 12:37    Assessment/Plan  CAD (coronary artery disease) Continue f/u Cardiology. Continue Ranexa 1000mg  bid, Isosorbide 120mg  qd, NTG prn, Plavix 75mg  qd, Lipitor 10mg  qd.     Hypothyroidism Continue Levothyroxine 23mcg qd, update TSH  Impaired fasting glucose Stable, last Hgb a1c 5.6 11/41/9  Hypertonicity of bladder Stable, continue Myrbetriq 50mg  qd  Polymyalgia rheumatica (HCC) Stable, continue Prednisone 7.5mg  qd.   Hyperlipidemia LDL goal <70 Continue Atorvastatin 10mg  qd  Depression, recurrent (HCC) Stable, continue Effexor 75mg  qd.   Essential hypertension Blood pressure is in control, continue Metoprolol 100mg  qd, Amlodipine 5mg  qd.   Sleep apnea Continue CPAP  GERD (gastroesophageal reflux disease) Stable, continue Ranitidine 150mg  bid.   (HFpEF) heart failure with preserved ejection fraction ([place]) Compensated clinically, continue Furosemide 20mg  qd, prn.   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 S/p hematology consultation.    Labs/tests ordered: TSH   Next appt: 4 months  Time spend 25 minutes.

## 2018-01-27 NOTE — Assessment & Plan Note (Signed)
Stable, continue Ranitidine 150mg  bid.

## 2018-01-27 NOTE — Assessment & Plan Note (Signed)
Stable, last Hgb a1c 5.6 11/41/9

## 2018-01-27 NOTE — Assessment & Plan Note (Signed)
Stable, continue Prednisone 7.5mg  qd.

## 2018-01-27 NOTE — Assessment & Plan Note (Signed)
Chronic in 10-15s, long term Prednisone may be contributory. S/p hematology consultation.

## 2018-01-27 NOTE — Patient Instructions (Addendum)
Update TSH, f/u in clinic 4 months

## 2018-01-27 NOTE — Assessment & Plan Note (Signed)
Compensated clinically, continue Furosemide 20mg  qd, prn.

## 2018-01-27 NOTE — Assessment & Plan Note (Signed)
Stable, continue Myrbetriq 50mg  qd

## 2018-01-27 NOTE — Assessment & Plan Note (Signed)
Continue CPAP.  

## 2018-01-28 ENCOUNTER — Encounter: Payer: Self-pay | Admitting: Nurse Practitioner

## 2018-02-07 ENCOUNTER — Other Ambulatory Visit: Payer: Self-pay | Admitting: Nurse Practitioner

## 2018-02-08 ENCOUNTER — Encounter: Payer: Self-pay | Admitting: Physician Assistant

## 2018-02-08 NOTE — Progress Notes (Signed)
Cardiology Office Note    Date:  02/10/2018  ID:  Erin Ingram, DOB 1940-09-04, MRN [id number] PCP:  Mast, Man X, NP  Cardiologist:  Larae Grooms, MD   Chief Complaint: f/u cath  History of Present Illness:  Erin Ingram is a 77 y.o. female with history of CAD (CABG 2000, 2 DES to SVG - RCA 11/2014), polymyalgia rheumatica on steroids, TIA, OSA on CPAP, hypothyroidism, HTN, HLD, CKD stage III, left subclavian artery stenosis and chronic diastolic heart failure who presents for post-hospital follow-up.  She has history of 2000 with SVG/free LIMA Y graft to the diagonal and distal LAD, SVG to OM1 and SVG to PDA. She underwent 2 DES to SVG to RCA in September 2016. Cath in 2017 showed stable anatomy with occluded SVG-diag and she had a low risk myoview back in 2018. On 01/11/18 she recently presented to the hospital with a weeks' worth of intermittent chest pain, left arm pain and back pain, mostly at night when at rest. She was admitted with troponins cycled and negative x3. Given her symptoms she was set up for cardiac cath noted above with patent LIMA-LAD, SVG-OM, SVG-PDA but occluded SVG to diag. Normal LVEDP and EF noted at 55-65%. The recommendation was to continue with medical therapy at this time. She ambulated without difficulty and no recurrent chest pain. Home medications were continued the same without changes. Labs otherwise showed chronic appearing leukocytosis/anemia with Hgb 10.8, LDL 64, Cr 1.13-1.23. The patient previously stopped ASA due to easy bruising. The diagnosis of L subclavian stenosis comes from cath report 11/2014 indicated Left subclavian stenosis prevented left radial access to the aorta. Carotid duplex 2017 showed no significant findings.   She presents back for follow-up overall feeling better. She now thinks the pain was related to her shoulder. It has resolved. She has chronic unchanged dyspnea. No orthopnea. She has rare intermittent lower extremity edema,  typically related to dietary indiscretion.    Past Medical History:  Diagnosis Date  . Anemia   . Arthritis    "fingers, back, shoulders, hips" (04/10/2016)  . Chronic diastolic CHF (congestive heart failure) (HCC)    a. normal EF, LVEDP at [facility] in 6/17 33 >> Lasix started   . CKD (chronic kidney disease), stage III ([place])   . Coronary artery disease    a. s/p CABG 2000 (SVG/ free LIMA Y graft to the diagonal and distal LAD, SVG to the OM 1, and SVG to the PDA) . b. Cath 12/19/2014 90% dSVG to RCA s/p 2 overlapping DES. c. LHC 2016, 2017 no intervention except diuresis needed. d. Low risk nuc 03/2016. e. Cath 12/2017 patent LIMA-LAD, SVG-OM, SVG-PDA but occluded SVG to diag.   . Depression    with anxious component  . GERD (gastroesophageal reflux disease)   . History of hiatal hernia   . Hyperlipidemia   . Hypothyroidism   . Obesity   . OSA on CPAP   . PMR (polymyalgia rheumatica) (HCC)   . Pneumonia    "2-3 times" (04/10/2016)  . Stable angina (HCC)    microvascular, improved with Ranexa  . Subclavian artery stenosis (HCC)    a. L by cath note in 2016.  Marland Kitchen TIA (transient ischemic attack)     Past Surgical History:  Procedure Laterality Date  . BREAST BIOPSY Right 1964  . CARDIAC CATHETERIZATION  08   patent grafts, no culprit lesions, EF 65%  . CARDIAC CATHETERIZATION N/A 12/19/2014   Procedure: Left Heart  Cath and Cors/Grafts Angiography;  Surgeon: Jettie Booze, MD;  Location: [place] [facility];  Service: Cardiovascular;  Laterality: N/A;  . CARDIAC CATHETERIZATION N/A 12/19/2014   Procedure: Coronary Stent Intervention;  Surgeon: Jettie Booze, MD;  Location: [place] [facility];  Service: Cardiovascular;  Laterality: N/A;  . CARDIAC CATHETERIZATION N/A 01/01/2015   Procedure: Left Heart Cath and Cors/Grafts Angiography;  Surgeon: Jettie Booze, MD;  Location: [place] [facility];  Service: Cardiovascular;  Laterality: N/A;  . CARDIAC CATHETERIZATION N/A  09/20/2015   Procedure: Left Heart Cath and Cors/Grafts Angiography;  Surgeon: Jettie Booze, MD;  Location: [place] [facility];  Service: Cardiovascular;  Laterality: N/A;  . CATARACT EXTRACTION W/ INTRAOCULAR LENS  IMPLANT, BILATERAL Bilateral 2011  . COLONOSCOPY WITH PROPOFOL N/A 07/27/2016   Procedure: COLONOSCOPY WITH PROPOFOL;  Surgeon: Wilford Corner, MD;  Location: [place];  Service: Endoscopy;  Laterality: N/A;  . CORONARY ARTERY BYPASS GRAFT  2000   ASCVD, multivessel, S./P.  . DILATION AND CURETTAGE OF UTERUS  1980s  . ESOPHAGOGASTRODUODENOSCOPY (EGD) WITH PROPOFOL N/A 07/27/2016   Procedure: ESOPHAGOGASTRODUODENOSCOPY (EGD) WITH PROPOFOL;  Surgeon: Wilford Corner, MD;  Location: [place];  Service: Endoscopy;  Laterality: N/A;  . FRACTURE SURGERY    . LEFT HEART CATH AND CORS/GRAFTS ANGIOGRAPHY N/A 01/12/2018   Procedure: LEFT HEART CATH AND CORS/GRAFTS ANGIOGRAPHY;  Surgeon: Jettie Booze, MD;  Location: [place] [facility];  Service: Cardiovascular;  Laterality: N/A;  . PATELLA FRACTURE SURGERY Left 1993    Current Medications: Current Meds  Medication Sig  . alendronate (FOSAMAX) 70 MG tablet Take 70 mg by mouth once a week. Take with a full glass of water on an empty stomach.  Marland Kitchen amLODipine (NORVASC) 5 MG tablet TAKE 1 TABLET BY MOUTH DAILY. (Patient taking differently: Take 5 mg by mouth daily. )  . atorvastatin (LIPITOR) 10 MG tablet TAKE 1 TABLET ONCE DAILY. (Patient taking differently: Take 10 mg by mouth daily at 6 PM. )  . Cholecalciferol (VITAMIN D PO) Take 2,000 Units by mouth daily.   . Cinnamon 500 MG TABS Take 1 each by mouth daily.  . clopidogrel (PLAVIX) 75 MG tablet TAKE 1 TABLET ONCE DAILY. (Patient taking differently: Take 75 mg by mouth daily. )  . Coenzyme Q10 10 MG capsule Take 10 mg by mouth at bedtime.   . fluticasone (FLONASE) 50 MCG/ACT nasal spray Place 2 sprays into both nostrils daily as needed for allergies.   . furosemide  (LASIX) 20 MG tablet Take 1 tablet (20 mg total) by mouth daily. (Patient taking differently: Take 20 mg by mouth daily. If swelling take an additional 20 mg)  . Glucosamine-Chondroit-Vit C-Mn (GLUCOSAMINE 1500 COMPLEX) CAPS Take 2 capsules by mouth daily after breakfast.  . isosorbide mononitrate (IMDUR) 60 MG 24 hr tablet Take 2 tablets (120 mg total) by mouth daily.  Marland Kitchen levocetirizine (XYZAL) 5 MG tablet Take 5 mg by mouth every evening.  Marland Kitchen levothyroxine (SYNTHROID, LEVOTHROID) 75 MCG tablet Take 75 mcg by mouth daily before breakfast.   . Melatonin 1 MG TABS Take 1 mg by mouth as needed (sleep).  . metoprolol succinate (TOPROL-XL) 100 MG 24 hr tablet TAKE 1 TABLET DAILY WITH OR IMMEDIATELY FOLLOWING A MEAL. (Patient taking differently: Take 100 mg by mouth daily. )  . mirabegron ER (MYRBETRIQ) 50 MG TB24 tablet Take 1 tablet (50 mg total) by mouth daily.  . nitroGLYCERIN (NITROSTAT) 0.4 MG SL tablet Place 0.4 mg under  the tongue every 5 (five) minutes as needed for chest pain. X 3 doses  . NONFORMULARY OR COMPOUNDED ITEM [facility]:  Achilles Tendonitis Cream - Diclofenac 3%, Baclofen 2%, Bupivacaine 1%, GAbapentin 6%, Ibuprofen 3%, Pentoxifylline 3%, apply 1-2 grams to affected area 3-4 times daily. (Patient taking differently: as needed. [facility]:  Achilles Tendonitis Cream - Diclofenac 3%, Baclofen 2%, Bupivacaine 1%, GAbapentin 6%, Ibuprofen 3%, Pentoxifylline 3%, apply 1-2 grams to affected area 3-4 times daily.)  . predniSONE (DELTASONE) 5 MG tablet Take 7.5 mg by mouth daily. with food or milk by mouth once daily.  . PSYLLIUM PO Take 6 capsules by mouth at bedtime.   Marland Kitchen RANEXA 1000 MG SR tablet TAKE 1 TABLET BY MOUTH TWICE DAILY. (Patient taking differently: Take 1,000 mg by mouth 2 (two) times daily. )  . ranitidine (ZANTAC) 150 MG capsule Take 150 mg by mouth 2 (two) times daily.  Marland Kitchen venlafaxine XR (EFFEXOR-XR) 75 MG 24 hr capsule Take 225 mg by mouth daily with breakfast.  Patient takes 3 capsules    Allergies:   Nsaids; Butorphanol; Sulfa antibiotics; Bisoprolol fumarate; Butorphanol tartrate; Codeine; Demerol [meperidine]; Imipramine; Meperidine and related; Statins; Tequin [gatifloxacin]; Brilinta [ticagrelor]; Pseudoephedrine hcl; Septra [sulfamethoxazole-trimethoprim]; and Sulfamethoxazole-trimethoprim   Social History   Socioeconomic History  . Marital status: Married    Spouse name: Not on file  . Number of children: 2  . Years of education: College  . Highest education level: Not on file  Occupational History  . Occupation: Retired Cytogeneticist  Social Needs  . Financial resource strain: Not hard at all  . Food insecurity:    Worry: Never true    Inability: Never true  . Transportation needs:    Medical: No    Non-medical: No  Tobacco Use  . Smoking status: Former Smoker    Packs/day: 0.50    Years: 7.00    Pack years: 3.50    Types: Cigarettes    Last attempt to quit: 08/21/1969    Years since quitting: 48.5  . Smokeless tobacco: Never Used  Substance and Sexual Activity  . Alcohol use: No    Alcohol/week: 0.0 standard drinks    Frequency: Never    Comment: rare  . Drug use: No  . Sexual activity: Never  Lifestyle  . Physical activity:    Days per week: 0 days    Minutes per session: 0 min  . Stress: To some extent  Relationships  . Social connections:    Talks on phone: More than three times a week    Gets together: More than three times a week    Attends religious service: Never    Active member of club or organization: No    Attends meetings of clubs or organizations: Never    Relationship status: Married  Other Topics Concern  . Not on file  Social History Narrative   Social History     Social History Narrative       Lives in [place] with husband.   Diet: Regular   Do you drink/eat things with caffeine? 1 cup caffeine per day.  Not much coffee   Marital status: Married                           What year  were you married? 1964   Do you live in a house, apartment, assisted living, condo, trailer, etc)? Here at [facility]   Is it one  or more stories?   How many persons live in your home? 2   Do you have any pets in your home? No   Current or past profession: Engineer, production (RN)   Do you exercise? I did                                                Type & how often: Swimming, Tai Chi, exercise classes   Do you have a living will? yes   Do you have a DNR Form? Yes   Do you have a POA/HPOA forms?               Family History:  The patient's family history includes Diabetes in her brother; Heart attack in her father and mother; Heart disease in her father and mother; Pulmonary embolism in her brother; Stroke in her paternal grandmother. There is no history of Hypertension.  ROS:   Please see the history of present illness. All other systems are reviewed and otherwise negative.    PHYSICAL EXAM:   VS:  BP (!) 126/50 (BP Location: Left Arm, Patient Position: Sitting, Cuff Size: Normal)   Pulse 64   Ht 4' 10" (1.473 m)   Wt 205 lb (93 kg)   SpO2 96%   BMI 42.85 kg/m   BMI: Body mass index is 42.85 kg/m. GEN: Well nourished, well developed morbidly obese WF, in no acute distress HEENT: normocephalic, atraumatic Neck: no JVD, carotid bruits, or masses Cardiac: RRR; no murmurs, rubs, or gallops, trace BLE edema  Respiratory:  clear to auscultation bilaterally, normal work of breathing GI: soft, nontender, nondistended, + BS MS: no deformity or atrophy Skin: warm and dry, no rash. Right groin cath site without hematoma, ecchymosis, or bruit. Neuro:  Alert and Oriented x 3, Strength and sensation are intact, follows commands Psych: euthymic mood, full affect  Wt Readings from Last 3 Encounters:  02/10/18 205 lb (93 kg)  01/27/18 200 lb 12.8 oz (91.1 kg)  01/12/18 200 lb 6.4 oz (90.9 kg)      Studies/Labs Reviewed:   EKG:  EKG was not ordered today.  Recent Labs: 06/24/2017: ALT  8 01/12/2018: BUN 17; Creatinine, Ser 1.13; Potassium 4.5; Sodium 140 01/24/2018: Hemoglobin 11.2; Platelets 303; TSH CANCELED   Lipid Panel    Component Value Date/Time   CHOL 163 01/12/2018 0549   TRIG 198 (H) 01/12/2018 0549   HDL 59 01/12/2018 0549   CHOLHDL 2.8 01/12/2018 0549   VLDL 40 01/12/2018 0549   LDLCALC 64 01/12/2018 0549   LDLCALC 71 06/24/2017 0715    Additional studies/ records that were reviewed today include: Summarized above.   ASSESSMENT & PLAN:   1. Atypical chest pain/CAD - resolved. Receiving PT on her shoulder and feeling much better. 2. Chronic diastolic CHF - mild volume overload on exam today in setting of eating teriyaki last night. Discussed sodium restriction. She occasionally takes an extra Lasix to manage fluid overload and has done well on this plan. She will take an additional tablet today and get back on track. LVEDP was normal by recent cath. If swelling becomes a persistent issue in the future, could consider cutting back on amlodipine. However, her current regimen has kept her stable this year. 3. Essential HTN - BP controlled. 4. Hyperlipidemia - continue statin. Recent LDL controlled. Previous myalgias with statin  but tolerating current regimen. 5. Morbid obesity - she reports chronic unchanged dyspnea. Since cardiac cath was stable with normal LVEDP, I suspect this is due to morbid obesity and deconditioning. She ambulates with an assistive device so I think exercise will be challenging for her. I would suggest referral to [facility] to help formulate a program for her. She would like to proceed.  Disposition: F/u with Dr. Irish Lack in 04/2018 as per prior recall.  Medication Adjustments/Labs and Tests Ordered: Current medicines are reviewed at length with the patient today.  Concerns regarding medicines are outlined above. Medication changes, Labs and Tests ordered today are summarized above and listed in the Patient  Instructions accessible in Encounters.   Signed, Charlie Pitter, PA-C  02/10/2018 9:22 AM    [facility] [address], [place], [place]  [address] Phone: [phone]; Fax: [phone]

## 2018-02-10 ENCOUNTER — Encounter: Payer: Self-pay | Admitting: Physician Assistant

## 2018-02-10 ENCOUNTER — Ambulatory Visit (INDEPENDENT_AMBULATORY_CARE_PROVIDER_SITE_OTHER): Payer: Medicare Other | Admitting: Physician Assistant

## 2018-02-10 VITALS — BP 126/50 | HR 64 | Ht <= 58 in | Wt 205.0 lb

## 2018-02-10 DIAGNOSIS — I2510 Atherosclerotic heart disease of native coronary artery without angina pectoris: Secondary | ICD-10-CM

## 2018-02-10 DIAGNOSIS — I10 Essential (primary) hypertension: Secondary | ICD-10-CM | POA: Diagnosis not present

## 2018-02-10 DIAGNOSIS — E785 Hyperlipidemia, unspecified: Secondary | ICD-10-CM

## 2018-02-10 DIAGNOSIS — I5032 Chronic diastolic (congestive) heart failure: Secondary | ICD-10-CM

## 2018-02-10 NOTE — Patient Instructions (Signed)
Medication Instructions:  Your physician recommends that you continue on your current medications as directed. Please refer to the Current Medication list given to you today.  If you need a refill on your cardiac medications before your next appointment, please call your pharmacy.  Your physician recommends that you continue on your current medications as directed. Please refer to the Current Medication list given to you today.  Lab work: NONE ORDERED TODAY  If you have labs (blood work) drawn today and your tests are completely normal, you will receive your results only by: Marland Kitchen MyChart Message (if you have MyChart) OR . A paper copy in the mail If you have any lab test that is abnormal or we need to change your treatment, we will call you to review the results.  Testing/Procedures: NONE ORDERED TODAY  Follow-Up: At [facility], you and your health needs are our priority.  As part of our continuing mission to provide you with exceptional heart care, we have created designated Provider Care Teams.  These Care Teams include your primary Cardiologist (physician) and Advanced Practice Providers (APPs -  Physician Assistants and Nurse Practitioners) who all work together to provide you with the care you need, when you need it. You will need a follow up appointment in 3 months.  Please call our office 2 months in advance to schedule this appointment.  You may see Larae Grooms, MD or one of the following Advanced Practice Providers on your designated Care Team:   [place], PA-C Melina Copa, PA-C . Ermalinda Barrios, PA-C  Any Other Special Instructions Will Be Listed Below (If Applicable). YOU HAVE BEEN REFERRED TO THE [facility]

## 2018-02-14 ENCOUNTER — Other Ambulatory Visit: Payer: Self-pay | Admitting: Nurse Practitioner

## 2018-03-04 ENCOUNTER — Other Ambulatory Visit: Payer: Self-pay | Admitting: Nurse Practitioner

## 2018-03-07 ENCOUNTER — Other Ambulatory Visit: Payer: Self-pay | Admitting: Interventional Cardiology

## 2018-03-10 ENCOUNTER — Ambulatory Visit: Payer: Self-pay | Admitting: Nurse Practitioner

## 2018-03-17 ENCOUNTER — Other Ambulatory Visit: Payer: Self-pay | Admitting: Nurse Practitioner

## 2018-03-17 DIAGNOSIS — F339 Major depressive disorder, recurrent, unspecified: Secondary | ICD-10-CM

## 2018-03-21 ENCOUNTER — Other Ambulatory Visit: Payer: Self-pay | Admitting: Interventional Cardiology

## 2018-03-21 MED ORDER — ISOSORBIDE MONONITRATE ER 60 MG PO TB24: 120.0000 mg | ORAL_TABLET | Freq: Every day | ORAL | 3 refills | Status: DC

## 2018-05-24 NOTE — Progress Notes (Signed)
Cardiology Office Note   Date:  05/25/2018   ID:  Erin, Ingram 1940-10-27, MRN [id number]  PCP:  Mast, Man X, NP    No chief complaint on file.  CAD  Wt Readings from Last 3 Encounters:  05/25/18 200 lb 9.6 oz (91 kg)  02/10/18 205 lb (93 kg)  01/27/18 200 lb 12.8 oz (91.1 kg)       History of Present Illness: Erin Ingram is a 78 y.o. female   with history of CAD (CABG 2000, 2 DES to SVG - RCA 11/2014), polymyalgia rheumatica on steroids, TIA, OSA on CPAP, hypothyroidism, HTN, HLD, CKD stage III, left subclavian artery stenosis and chronic diastolic heart failure who presents for post-hospital follow-up.  She has history of 2000 with SVG/free LIMA Y graft to the diagonal and distal LAD, SVG to OM1 and SVG to PDA. She underwent 2 DES to SVG to RCA in September 2016. Cath in 2017 showed stable anatomy with occluded SVG-diag and she had a low risk myoview back in 2018. On 01/11/18 she recently presented to the hospital with a weeks' worth of intermittent chest pain, left arm pain and back pain, mostly at night when at rest. She was admitted with troponins cycled and negative x3. Given her symptoms she was set up for cardiac cath noted above with patent LIMA-LAD, SVG-OM, SVG-PDA but occluded SVG to diag. Normal LVEDP and EF noted at 55-65%. The recommendation was to continue with medical therapy at this time.  The patient previously stopped ASA due to easy bruising. The diagnosis of L subclavian stenosis comes from cath report 11/2014 indicated Left subclavian stenosis prevented left radial access to the aorta. Carotid duplex 2017 showed no significant findings.   Repeat shoulder pain in 2019 showed:  Prox LAD to Mid LAD lesion is 25% stenosed.  Ost Cx to Dist Cx lesion is 90% stenosed. SVG to OM is widely patent.  Mid RCA lesion is 90% stenosed. SVG to PDA is patent. Patent stents in distal graft.  SVG to diagonal is occluded.  Mid LAD lesion is 90% stenosed. LIMA to  LAD is patent.  The left ventricular systolic function is normal.  LV end diastolic pressure is normal. LVEDP 10 mm Hg.  The left ventricular ejection fraction is 55-65% by visual estimate.  There is no aortic valve stenosis.  Pain in shoulder reoslved with PT.  Denies : Chest pain. Dizziness. Leg edema. Nitroglycerin use. Orthopnea. Palpitations. Paroxysmal nocturnal dyspnea.  Syncope.   She does not exercise.  SHe has some DOE.  She has chronic fatigue.  She uses CPAP.    Past Medical History:  Diagnosis Date  . Anemia   . Arthritis    "fingers, back, shoulders, hips" (04/10/2016)  . Chronic diastolic CHF (congestive heart failure) (HCC)    a. normal EF, LVEDP [facility] in 6/17 33 >> Lasix started   . CKD (chronic kidney disease), stage III ([place])   . Coronary artery disease    a. s/p CABG 2000 (SVG/ free LIMA Y graft to the diagonal and distal LAD, SVG to the OM 1, and SVG to the PDA) . b. Cath 12/19/2014 90% dSVG to RCA s/p 2 overlapping DES. c. LHC 2016, 2017 no intervention except diuresis needed. d. Low risk nuc 03/2016. e. Cath 12/2017 patent LIMA-LAD, SVG-OM, SVG-PDA but occluded SVG to diag.   . Depression    with anxious component  . GERD (gastroesophageal reflux disease)   . History of  hiatal hernia   . Hyperlipidemia   . Hypothyroidism   . Obesity   . OSA on CPAP   . PMR (polymyalgia rheumatica) (HCC)   . Pneumonia    "2-3 times" (04/10/2016)  . Stable angina (HCC)    microvascular, improved with Ranexa  . Subclavian artery stenosis (HCC)    a. L by cath note in 2016.  Marland Kitchen TIA (transient ischemic attack)     Past Surgical History:  Procedure Laterality Date  . BREAST BIOPSY Right 1964  . CARDIAC CATHETERIZATION  08   patent grafts, no culprit lesions, EF 65%  . CARDIAC CATHETERIZATION N/A 12/19/2014   Procedure: Left Heart Cath and Cors/Grafts Angiography;  Surgeon: Jettie Booze, MD;  Location: [place] [facility];  Service: Cardiovascular;  Laterality: N/A;    . CARDIAC CATHETERIZATION N/A 12/19/2014   Procedure: Coronary Stent Intervention;  Surgeon: Jettie Booze, MD;  Location: [place] [facility];  Service: Cardiovascular;  Laterality: N/A;  . CARDIAC CATHETERIZATION N/A 01/01/2015   Procedure: Left Heart Cath and Cors/Grafts Angiography;  Surgeon: Jettie Booze, MD;  Location: [place] [facility];  Service: Cardiovascular;  Laterality: N/A;  . CARDIAC CATHETERIZATION N/A 09/20/2015   Procedure: Left Heart Cath and Cors/Grafts Angiography;  Surgeon: Jettie Booze, MD;  Location: [place] [facility];  Service: Cardiovascular;  Laterality: N/A;  . CATARACT EXTRACTION W/ INTRAOCULAR LENS  IMPLANT, BILATERAL Bilateral 2011  . COLONOSCOPY WITH PROPOFOL N/A 07/27/2016   Procedure: COLONOSCOPY WITH PROPOFOL;  Surgeon: Wilford Corner, MD;  Location: [place];  Service: Endoscopy;  Laterality: N/A;  . CORONARY ARTERY BYPASS GRAFT  2000   ASCVD, multivessel, S./P.  . DILATION AND CURETTAGE OF UTERUS  1980s  . ESOPHAGOGASTRODUODENOSCOPY (EGD) WITH PROPOFOL N/A 07/27/2016   Procedure: ESOPHAGOGASTRODUODENOSCOPY (EGD) WITH PROPOFOL;  Surgeon: Wilford Corner, MD;  Location: [place];  Service: Endoscopy;  Laterality: N/A;  . FRACTURE SURGERY    . LEFT HEART CATH AND CORS/GRAFTS ANGIOGRAPHY N/A 01/12/2018   Procedure: LEFT HEART CATH AND CORS/GRAFTS ANGIOGRAPHY;  Surgeon: Jettie Booze, MD;  Location: [place] [facility];  Service: Cardiovascular;  Laterality: N/A;  . PATELLA FRACTURE SURGERY Left 1993     Current Outpatient Medications  Medication Sig Dispense Refill  . alendronate (FOSAMAX) 70 MG tablet Take 70 mg by mouth once a week. Take with a full glass of water on an empty stomach.    Marland Kitchen amLODipine (NORVASC) 5 MG tablet Take 5 mg by mouth daily.    Marland Kitchen atorvastatin (LIPITOR) 10 MG tablet Take 10 mg by mouth daily.    . Cholecalciferol (VITAMIN D PO) Take 2,000 Units by mouth daily.     . Cinnamon 500 MG TABS Take 1 each  by mouth daily.    . clopidogrel (PLAVIX) 75 MG tablet Take 75 mg by mouth daily.    . Coenzyme Q10 10 MG capsule Take 10 mg by mouth at bedtime.     . fluticasone (FLONASE) 50 MCG/ACT nasal spray Place 2 sprays into both nostrils daily as needed for allergies.     . furosemide (LASIX) 20 MG tablet Take 1 tablet (20 mg total) by mouth daily. (Patient taking differently: Take 20 mg by mouth daily. If swelling take an additional 20 mg) 90 tablet 3  . Glucosamine-Chondroit-Vit C-Mn (GLUCOSAMINE 1500 COMPLEX) CAPS Take 2 capsules by mouth daily after breakfast.    . isosorbide mononitrate (IMDUR) 60 MG 24 hr tablet Take 2 tablets (120 mg total) by  mouth daily. 180 tablet 3  . levocetirizine (XYZAL) 5 MG tablet Take 5 mg by mouth every evening.    Marland Kitchen levothyroxine (SYNTHROID, LEVOTHROID) 75 MCG tablet TAKE 1 TABLET ONCE DAILY ON EMPTY STOMACH. 30 tablet 3  . Melatonin 1 MG TABS Take 1 mg by mouth as needed (sleep).    . metoprolol succinate (TOPROL-XL) 100 MG 24 hr tablet TAKE 1 TABLET DAILY WITH OR IMMEDIATELY FOLLOWING A MEAL. (Patient taking differently: Take 100 mg by mouth daily. ) 90 tablet 3  . mirabegron ER (MYRBETRIQ) 50 MG TB24 tablet Take 1 tablet (50 mg total) by mouth daily. 30 tablet 6  . nitroGLYCERIN (NITROSTAT) 0.4 MG SL tablet Place 0.4 mg under the tongue every 5 (five) minutes as needed for chest pain. X 3 doses    . NONFORMULARY OR COMPOUNDED ITEM [facility]:  Achilles Tendonitis Cream - Diclofenac 3%, Baclofen 2%, Bupivacaine 1%, GAbapentin 6%, Ibuprofen 3%, Pentoxifylline 3%, apply 1-2 grams to affected area 3-4 times daily. (Patient taking differently: as needed. [facility]:  Achilles Tendonitis Cream - Diclofenac 3%, Baclofen 2%, Bupivacaine 1%, GAbapentin 6%, Ibuprofen 3%, Pentoxifylline 3%, apply 1-2 grams to affected area 3-4 times daily.) 120 each 2  . predniSONE (DELTASONE) 5 MG tablet Take 7.5 mg by mouth daily. with food or milk by mouth once daily.    .  PSYLLIUM PO Take 6 capsules by mouth at bedtime.     . ranitidine (ZANTAC) 150 MG capsule Take 150 mg by mouth 2 (two) times daily.    . ranolazine (RANEXA) 1000 MG SR tablet TAKE 1 TABLET BY MOUTH TWICE DAILY. 180 tablet 3  . venlafaxine XR (EFFEXOR-XR) 75 MG 24 hr capsule TAKE 3 CAPSULES ONCE A DAY. 270 capsule 0   No current facility-administered medications for this visit.     Allergies:   Nsaids; Butorphanol; Sulfa antibiotics; Bisoprolol fumarate; Butorphanol tartrate; Codeine; Demerol [meperidine]; Imipramine; Meperidine and related; Statins; Tequin [gatifloxacin]; Brilinta [ticagrelor]; Pseudoephedrine hcl; Septra [sulfamethoxazole-trimethoprim]; and Sulfamethoxazole-trimethoprim    Social History:  The patient  reports that she quit smoking about 48 years ago. Her smoking use included cigarettes. She has a 3.50 pack-year smoking history. She has never used smokeless tobacco. She reports that she does not drink alcohol or use drugs.   Family History:  The patient's family history includes Diabetes in her brother; Heart attack in her father and mother; Heart disease in her father and mother; Pulmonary embolism in her brother; Stroke in her paternal grandmother.    ROS:  Please see the history of present illness.   Otherwise, review of systems are positive for difficulty losing weight.   All other systems are reviewed and negative.    PHYSICAL EXAM: VS:  BP (!) 106/52   Pulse 65   Ht 4\' 10"  (1.473 m)   Wt 200 lb 9.6 oz (91 kg)   SpO2 96%   BMI 41.93 kg/m  , BMI Body mass index is 41.93 kg/m. GEN: Well nourished, well developed, in no acute distress  HEENT: normal  Neck: no JVD, carotid bruits, or masses Cardiac: RRR; no murmurs, rubs, or gallops,no edema  Respiratory:  clear to auscultation bilaterally, normal work of breathing GI: soft, nontender, nondistended, + BS MS: no deformity or atrophy ; 2+ left radial pulse, 3+ right radial pulse Skin: warm and dry, no rash Neuro:   Strength and sensation are intact Psych: euthymic mood, full affect   Recent Labs: 06/24/2017: ALT 8 01/12/2018: BUN 17; Creatinine, Ser 1.13;  Potassium 4.5; Sodium 140 01/24/2018: Hemoglobin 11.2; Platelets 303; TSH CANCELED   Lipid Panel    Component Value Date/Time   CHOL 163 01/12/2018 0549   TRIG 198 (H) 01/12/2018 0549   HDL 59 01/12/2018 0549   CHOLHDL 2.8 01/12/2018 0549   VLDL 40 01/12/2018 0549   LDLCALC 64 01/12/2018 0549   LDLCALC 71 06/24/2017 0715     Other studies Reviewed: Additional studies/ records that were reviewed today with results demonstrating: labs reviewed.   ASSESSMENT AND PLAN:  1. CAD: Atypical chest pain. Patent grafts in 10/19 cath.  COntinue aggressive secondary prevention.  2. Chronic diastolic CHF: She has had issues with compliance regarding low sodium diet. Appears euvolemic.  We spke about diet again.  3. HTN: The current medical regimen is effective;  continue present plan and medications. 4. Hyperlipidemia: LDL 64, continue lipid lowering therapy. 5. Morbid obesity: Working on losing weight.  Increase activity.  Minimize processed food and carbs.    Current medicines are reviewed at length with the patient today.  The patient concerns regarding her medicines were addressed.  The following changes have been made:  No change  Labs/ tests ordered today include:  No orders of the defined types were placed in this encounter.   Recommend 150 minutes/week of aerobic exercise Low fat, low carb, high fiber diet recommended  Disposition:   FU in 1 year   Signed, Larae Grooms, MD  05/25/2018 11:33 AM    [facility] [address], [place], [place]  [address] Phone: [phone]; Fax: [phone]

## 2018-05-25 ENCOUNTER — Encounter: Payer: Self-pay | Admitting: Interventional Cardiology

## 2018-05-25 ENCOUNTER — Ambulatory Visit (INDEPENDENT_AMBULATORY_CARE_PROVIDER_SITE_OTHER): Payer: Medicare Other | Admitting: Interventional Cardiology

## 2018-05-25 VITALS — BP 106/52 | HR 65 | Ht <= 58 in | Wt 200.6 lb

## 2018-05-25 DIAGNOSIS — E785 Hyperlipidemia, unspecified: Secondary | ICD-10-CM | POA: Diagnosis not present

## 2018-05-25 DIAGNOSIS — I10 Essential (primary) hypertension: Secondary | ICD-10-CM | POA: Diagnosis not present

## 2018-05-25 DIAGNOSIS — I5032 Chronic diastolic (congestive) heart failure: Secondary | ICD-10-CM

## 2018-05-25 DIAGNOSIS — I2510 Atherosclerotic heart disease of native coronary artery without angina pectoris: Secondary | ICD-10-CM | POA: Diagnosis not present

## 2018-05-25 NOTE — Patient Instructions (Signed)

## 2018-05-31 ENCOUNTER — Non-Acute Institutional Stay: Payer: Medicare Other

## 2018-05-31 ENCOUNTER — Telehealth: Payer: Self-pay

## 2018-05-31 NOTE — Telephone Encounter (Signed)
Patient called to inform Ivin Booty and [facility] that she was in the clinic area at 8:00 am today for an appointment and left cause no one showed up. I informed patient she was to have labs today and her appointment with [facility] is scheduled for this Thursday.   Patient appeared confused and stated she thought her appointment with [facility] was today. Patient states she called and left message for Ivin Booty this am and had not heard back. I asked patient if she called [facility], patient advised that she called some number at [facility]. I informed patient Ivin Booty is now in office 5 days a week working remotely. Patient verbalized understanding and asked if Ivin Booty could call her despite my multiple attempts asking patient if there was something I could help her with.

## 2018-06-01 NOTE — Telephone Encounter (Signed)
Called patient left message for her to return my call.

## 2018-06-01 NOTE — Telephone Encounter (Signed)
Erin Ingram please advise if you have spoke with patient

## 2018-06-02 ENCOUNTER — Non-Acute Institutional Stay: Payer: Medicare Other | Admitting: Nurse Practitioner

## 2018-06-02 ENCOUNTER — Encounter: Payer: Self-pay | Admitting: Nurse Practitioner

## 2018-06-02 ENCOUNTER — Ambulatory Visit: Payer: Medicare Other | Admitting: Nurse Practitioner

## 2018-06-02 ENCOUNTER — Other Ambulatory Visit: Payer: Self-pay

## 2018-06-02 DIAGNOSIS — M353 Polymyalgia rheumatica: Secondary | ICD-10-CM | POA: Diagnosis not present

## 2018-06-02 DIAGNOSIS — E785 Hyperlipidemia, unspecified: Secondary | ICD-10-CM

## 2018-06-02 DIAGNOSIS — K5900 Constipation, unspecified: Secondary | ICD-10-CM | POA: Diagnosis not present

## 2018-06-02 DIAGNOSIS — F339 Major depressive disorder, recurrent, unspecified: Secondary | ICD-10-CM

## 2018-06-02 DIAGNOSIS — N318 Other neuromuscular dysfunction of bladder: Secondary | ICD-10-CM

## 2018-06-02 DIAGNOSIS — J309 Allergic rhinitis, unspecified: Secondary | ICD-10-CM

## 2018-06-02 DIAGNOSIS — M810 Age-related osteoporosis without current pathological fracture: Secondary | ICD-10-CM

## 2018-06-02 DIAGNOSIS — I10 Essential (primary) hypertension: Secondary | ICD-10-CM | POA: Diagnosis not present

## 2018-06-02 DIAGNOSIS — E039 Hypothyroidism, unspecified: Secondary | ICD-10-CM

## 2018-06-02 DIAGNOSIS — I2510 Atherosclerotic heart disease of native coronary artery without angina pectoris: Secondary | ICD-10-CM

## 2018-06-02 DIAGNOSIS — I5030 Unspecified diastolic (congestive) heart failure: Secondary | ICD-10-CM

## 2018-06-02 DIAGNOSIS — K219 Gastro-esophageal reflux disease without esophagitis: Secondary | ICD-10-CM

## 2018-06-02 NOTE — Assessment & Plan Note (Signed)
GERD stable, continue  Ranitidine 150mg  bid.

## 2018-06-02 NOTE — Assessment & Plan Note (Signed)
Her mood is stable, continue Venlafaxine 75mg qd.  

## 2018-06-02 NOTE — Assessment & Plan Note (Signed)
Allergies, stable, continue Zyzal 5mg  qd, Flonase bid prn.

## 2018-06-02 NOTE — Assessment & Plan Note (Signed)
Stable, continue Psyllium #6 capsules qd.

## 2018-06-02 NOTE — Progress Notes (Signed)
Location:   clinic [place]   Place of Service:  Clinic (12) Provider: Marlana Latus NP  Code Status: DNR Goals of Care: [place] 01/11/2018  Does Patient Have a Medical Advance Directive? Yes  Type of Advance Directive Out of facility DNR (pink MOST or yellow form)  Does patient want to make changes to medical advance directive? No - Patient declined  Copy of [place] in Chart? -  Would patient like information on creating a medical advance directive? No - Patient declined  Pre-existing out of facility DNR order (yellow form or pink MOST form) Yellow form placed in chart (order not valid for inpatient use);Pink MOST form placed in chart (order not valid for inpatient use)     Chief Complaint  Patient presents with  . Medical Management of Chronic Issues    4 mo f/u    HPI: Patient is a 78 y.o. female seen today for medical management of chronic diseases.     The patient has history of constipation, stable on Psyllium #6 capsules qd. Polymyalgia Rheumatica  stable on Prednisone 7.13m qd, achillis tendonitis cream-compound prn. 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 HTN, blood pressure is controlled on Metoprolol 10064md, Amlodipine 5mg49m.  Hypothyroidism, on Levothyroxine 75mc48m, last TSH 2.56 06/24/17. Allergies, stable on Zyzal 5mg q37mFlonase bid prn.  CAD stable, no angina since last visited, on prn NTG, Isosorbide 120mg q70manolasine 1000mg qd15mF/edema, stable on Furosemide 20mg qd 97mdaily prn. Osteoporosis, no recent fractures, on Vit D, Ca, and Alendronate. Her mood is stable on Venlafaxine 75mg qd. 36m stable on Ranitidine 150mg bid. 23mPast Medical History:  Diagnosis Date  . Anemia   . Arthritis    "fingers, back, shoulders, hips" (04/10/2016)  . Chronic diastolic CHF (congestive heart failure) (HCC)    a. normal EF, [facility] >> Lasix started   . CKD (chronic kidney disease), stage III (HCC)   . CoStokesary artery disease    a. s/p CABG 2000 (SVG/ free LIMA Y graft to the diagonal and distal LAD, SVG to the OM 1, and SVG to the PDA) . b. Cath 12/19/2014 90% dSVG to RCA s/p 2 overlapping DES. c. LHC 2016, 2017 no intervention except diuresis needed. d. Low risk nuc 03/2016. e. Cath 12/2017 patent LIMA-LAD, SVG-OM, SVG-PDA but occluded SVG to diag.   . Depression    with anxious component  . GERD (gastroesophageal reflux disease)   . History of hiatal hernia   . Hyperlipidemia   . Hypothyroidism   . Obesity   . OSA on CPAP   . PMR (polymyalgia rheumatica) (HCC)   . Pneumonia    "2-3 times" (04/10/2016)  . Stable angina (HCC)    microvascular, improved with Ranexa  . Subclavian artery stenosis (HCC)    a. L by cath note in 2016.  . TIA (tranMarland Kitchenient ischemic attack)     Past Surgical History:  Procedure Laterality Date  . BREAST BIOPSY Right 1964  . CARDIAC CATHETERIZATION  08   patent grafts, no culprit lesions, EF 65%  . CARDIAC CATHETERIZATION N/A 12/19/2014   Procedure: Left Heart Cath and Cors/Grafts Angiography;  Surgeon: Jayadeep S Jettie Boozetion: [facility]: Cardiovascular;  Laterality: N/A;  . CARDIAC CATHETERIZATION N/A 12/19/2014   Procedure: Coronary Stent Intervention;  Surgeon: Jayadeep S Jettie Boozetion: [facility] [place] Cardiovascular;  Laterality:  N/A;  . CARDIAC CATHETERIZATION N/A 01/01/2015   Procedure: Left Heart Cath and Cors/Grafts Angiography;  Surgeon: Jettie Booze, MD;  Location: [place] [facility];  Service: Cardiovascular;  Laterality: N/A;  . CARDIAC CATHETERIZATION N/A 09/20/2015   Procedure: Left Heart Cath and Cors/Grafts Angiography;  Surgeon: Jettie Booze, MD;  Location: [place] [facility];  Service: Cardiovascular;  Laterality: N/A;  . CATARACT EXTRACTION W/ INTRAOCULAR LENS  IMPLANT, BILATERAL Bilateral 2011  . COLONOSCOPY WITH PROPOFOL N/A 07/27/2016   Procedure: COLONOSCOPY WITH PROPOFOL;  Surgeon: Wilford Corner,  MD;  Location: [place];  Service: Endoscopy;  Laterality: N/A;  . CORONARY ARTERY BYPASS GRAFT  2000   ASCVD, multivessel, S./P.  . DILATION AND CURETTAGE OF UTERUS  1980s  . ESOPHAGOGASTRODUODENOSCOPY (EGD) WITH PROPOFOL N/A 07/27/2016   Procedure: ESOPHAGOGASTRODUODENOSCOPY (EGD) WITH PROPOFOL;  Surgeon: Wilford Corner, MD;  Location: [place];  Service: Endoscopy;  Laterality: N/A;  . FRACTURE SURGERY    . LEFT HEART CATH AND CORS/GRAFTS ANGIOGRAPHY N/A 01/12/2018   Procedure: LEFT HEART CATH AND CORS/GRAFTS ANGIOGRAPHY;  Surgeon: Jettie Booze, MD;  Location: [place] [facility];  Service: Cardiovascular;  Laterality: N/A;  . PATELLA FRACTURE SURGERY Left 1993    Allergies  Allergen Reactions  . Nsaids Other (See Comments)    Due to chronic kidney failure  . Butorphanol Other (See Comments)    agitation Constipation Produced a lot of urine, made patient feel crazy  . Sulfa Antibiotics Rash  . Bisoprolol Fumarate Other (See Comments)    Doesn't remember  . Butorphanol Tartrate Other (See Comments)    Produced a lot of urine, made patient feel crazy  . Codeine Nausea And Vomiting  . Demerol [Meperidine] Nausea And Vomiting  . Imipramine     Sweating, facial dysfunction   . Meperidine And Related Nausea And Vomiting  . Statins     MYALGIAS  . Tequin [Gatifloxacin] Other (See Comments)    Caused hypoglycemia Low blood sugar  . Brilinta [Ticagrelor] Rash    CAUSES PETECHIAE, PURPURA  . Pseudoephedrine Hcl Palpitations  . Septra [Sulfamethoxazole-Trimethoprim] Rash  . Sulfamethoxazole-Trimethoprim Rash    Allergies as of 06/02/2018      Reactions   Nsaids Other (See Comments)   Due to chronic kidney failure   Butorphanol Other (See Comments)   agitation Constipation Produced a lot of urine, made patient feel crazy   Sulfa Antibiotics Rash   Bisoprolol Fumarate Other (See Comments)   Doesn't remember   Butorphanol Tartrate Other (See Comments)    Produced a lot of urine, made patient feel crazy   Codeine Nausea And Vomiting   Demerol [meperidine] Nausea And Vomiting   Imipramine    Sweating, facial dysfunction    Meperidine And Related Nausea And Vomiting   Statins    MYALGIAS   Tequin [gatifloxacin] Other (See Comments)   Caused hypoglycemia Low blood sugar   Brilinta [ticagrelor] Rash   CAUSES PETECHIAE, PURPURA   Pseudoephedrine Hcl Palpitations   Septra [sulfamethoxazole-trimethoprim] Rash   Sulfamethoxazole-trimethoprim Rash      Medication List       Accurate as of June 02, 2018 11:59 PM. Always use your most recent med list.        alendronate 70 MG tablet Commonly known as:  FOSAMAX Take 70 mg by mouth once a week. Take with a full glass of water on an empty stomach.   amLODipine 5 MG tablet Commonly known as:  NORVASC Take 5 mg  by mouth daily.   atorvastatin 10 MG tablet Commonly known as:  LIPITOR Take 10 mg by mouth daily.   Cinnamon 500 MG Tabs Take 1 each by mouth daily.   clopidogrel 75 MG tablet Commonly known as:  PLAVIX Take 75 mg by mouth daily.   Coenzyme Q10 10 MG capsule Take 10 mg by mouth at bedtime.   fluticasone 50 MCG/ACT nasal spray Commonly known as:  FLONASE Place 2 sprays into both nostrils daily as needed for allergies.   furosemide 20 MG tablet Commonly known as:  LASIX Take 1 tablet (20 mg total) by mouth daily.   Glucosamine 1500 Complex Caps Take 2 capsules by mouth daily after breakfast.   isosorbide mononitrate 60 MG 24 hr tablet Commonly known as:  IMDUR Take 2 tablets (120 mg total) by mouth daily.   levocetirizine 5 MG tablet Commonly known as:  XYZAL Take 5 mg by mouth every evening.   levothyroxine 75 MCG tablet Commonly known as:  SYNTHROID, LEVOTHROID TAKE 1 TABLET ONCE DAILY ON EMPTY STOMACH.   metoprolol succinate 100 MG 24 hr tablet Commonly known as:  TOPROL-XL TAKE 1 TABLET DAILY WITH OR IMMEDIATELY FOLLOWING A MEAL.   mirabegron ER 50  MG Tb24 tablet Commonly known as:  Myrbetriq Take 1 tablet (50 mg total) by mouth daily.   nitroGLYCERIN 0.4 MG SL tablet Commonly known as:  NITROSTAT Place 0.4 mg under the tongue every 5 (five) minutes as needed for chest pain. X 3 doses   NONFORMULARY OR COMPOUNDED ITEM [facility]:  [place] Tendonitis Cream - Diclofenac 3%, Baclofen 2%, Bupivacaine 1%, GAbapentin 6%, Ibuprofen 3%, Pentoxifylline 3%, apply 1-2 grams to affected area 3-4 times daily.   predniSONE 5 MG tablet Commonly known as:  DELTASONE Take 7.5 mg by mouth daily. Take 5 mg on Sun and Wednesday.With food or milk by mouth once daily.   PSYLLIUM PO Take 6 capsules by mouth at bedtime.   ranitidine 150 MG capsule Commonly known as:  ZANTAC Take 150 mg by mouth 2 (two) times daily.   ranolazine 1000 MG SR tablet Commonly known as:  RANEXA TAKE 1 TABLET BY MOUTH TWICE DAILY.   venlafaxine XR 75 MG 24 hr capsule Commonly known as:  EFFEXOR-XR TAKE 3 CAPSULES ONCE A DAY.   VITAMIN D PO Take 2,000 Units by mouth daily.       Review of Systems:  Review of Systems  Constitutional: Negative for activity change, appetite change, chills, diaphoresis, fatigue and fever.  HENT: Positive for hearing loss. Negative for congestion and voice change.   Respiratory: Positive for shortness of breath. Negative for cough and wheezing.   Cardiovascular: Positive for leg swelling. Negative for chest pain and palpitations.  Gastrointestinal: Negative.  Negative for abdominal distention, abdominal pain, constipation, diarrhea, nausea and vomiting.  Genitourinary: Negative for difficulty urinating, dysuria and urgency.  Musculoskeletal: Positive for arthralgias, back pain, gait problem and myalgias.  Skin: Negative for color change and pallor.  Neurological: Negative for dizziness, facial asymmetry, speech difficulty, weakness and headaches.  Psychiatric/Behavioral: Negative for agitation, behavioral problems, confusion,  hallucinations and sleep disturbance. The patient is not nervous/anxious.     Health Maintenance  Topic Date Due  . TETANUS/TDAP  04/20/2022  . INFLUENZA VACCINE  Completed  . DEXA SCAN  Completed  . PNA vac Low Risk Adult  Completed    Physical Exam: Vitals:   06/02/18 1434  BP: 112/64  Pulse: 63  Resp: 18  Temp: (!) 97.5  F (36.4 C)  TempSrc: Oral  SpO2: 94%  Weight: 199 lb 9.6 oz (90.5 kg)  Height: _0  (1.473 m)   Body mass index is 41.72 kg/m. Physical Exam Constitutional:      General: She is not in acute distress.    Appearance: Normal appearance. She is not ill-appearing, toxic-appearing or diaphoretic.  HENT:     Head: Normocephalic and atraumatic.     Nose: Nose normal.     Mouth/Throat:     Mouth: Mucous membranes are moist.  Eyes:     Extraocular Movements: Extraocular movements intact.     Conjunctiva/sclera: Conjunctivae normal.     Pupils: Pupils are equal, round, and reactive to light.  Neck:     Musculoskeletal: Normal range of motion and neck supple.  Cardiovascular:     Rate and Rhythm: Normal rate and regular rhythm.     Heart sounds: No murmur.  Pulmonary:     Effort: Pulmonary effort is normal.     Breath sounds: No wheezing, rhonchi or rales.  Abdominal:     General: There is no distension.     Palpations: Abdomen is soft.     Tenderness: There is no abdominal tenderness. There is no guarding or rebound.  Musculoskeletal:     Right lower leg: Edema present.     Left lower leg: Edema present.     Comments: Chronic trace edema BLE.   Skin:    General: Skin is warm and dry.  Neurological:     General: No focal deficit present.     Mental Status: She is alert and oriented to person, place, and time. Mental status is at baseline.     Motor: No weakness.     Coordination: Coordination normal.     Gait: Gait abnormal.     Comments: Ambulates with walker.   Psychiatric:        Mood and Affect: Mood normal.        Thought Content:  Thought content normal.        Judgment: Judgment normal.     Labs reviewed: Basic Metabolic Panel: Recent Labs    06/24/17 0715 01/11/18 0925 01/12/18 0549 01/24/18 1042  NA 143 139 140  --   K 4.3 3.4* 4.5  --   CL 106 105 109  --   CO2 _1 --   GLUCOSE 90 93 130* CANCELED  BUN _2 --   CREATININE 1.18* 1.23* 1.13*  --   CALCIUM 9.4 8.9 8.7*  --   TSH 2.56  --   --  CANCELED   Liver Function Tests: Recent Labs    06/24/17 0715  AST 10  ALT 8  BILITOT 0.3  PROT 5.8*   No results for input(s): LIPASE, AMYLASE in the last 8760 hours. No results for input(s): AMMONIA in the last 8760 hours. CBC: Recent Labs    06/24/17 0715 01/11/18 0925 01/24/18 1042  WBC 15.4* 15.0* 14.4*  NEUTROABS  --   --  10,253*  HGB 10.8* 10.8* 11.2*  HCT 31.7* 34.3* 34.3*  MCV 90.1 95.0 91.0  PLT 289 255 303   Lipid Panel: Recent Labs    06/24/17 0715 01/12/18 0549  CHOL 156 163  HDL 66 59  LDLCALC 71 64  TRIG 111 198*  CHOLHDL 2.4 2.8   Lab Results  Component Value Date   HGBA1C 5.6 01/24/2018    Procedures since last visit: No results found.  Assessment/Plan  Constipation Stable, continue Psyllium #6 capsules qd.   Polymyalgia rheumatica (HCC) Stable, continue Prednisone 7.73m qd x5 days, 544mqd x2 days, achillis tendonitis cream-compound prn. Update CBC/diff  Hypertonicity of bladder OBA stable, continue Myrbetriq 5034md.   Essential hypertension HTN, blood pressure is controlled, continue  Metoprolol 100m10m, Amlodipine 5mg 43m   Hypothyroidism  Hypothyroidism, continue Levothyroxine 75mcg74m last TSH 2.56 06/24/17, update TSH  Allergic rhinitis Allergies, stable, continue Zyzal 5mg qd59mlonase bid prn.   CAD (coronary artery disease)  CAD stable, no angina since last visited, continue prn NTG, Isosorbide 120mg qd68mnolasine 1000mg qd.27mHFpEF) heart failure with preserved ejection fraction (HCC) CHF/edema, stable, continue Furosemide  20mg qd a57maily prn. CMP/eGFR  Osteoporosis  Osteoporosis, no recent fractures, continue  Vit D, Ca, and Alendronate.  GERD (gastroesophageal reflux disease) GERD stable, continue  Ranitidine 150mg bid. 35mression, recurrent (HCC)  Her [place] is stable, continue Venlafaxine 75mg qd.   60mipidemia Continue Lipitor 10mg qd, upd7mlipids.    Labs/tests ordered: CBC/diff, CMP/eGFR, TSH, lipid panel.   Next appt:  4 months

## 2018-06-02 NOTE — Assessment & Plan Note (Addendum)
OBA stable, continue Myrbetriq 50mg  qd.

## 2018-06-02 NOTE — Assessment & Plan Note (Signed)
Osteoporosis, no recent fractures, continue  Vit D, Ca, and Alendronate.

## 2018-06-02 NOTE — Assessment & Plan Note (Addendum)
Stable, continue Prednisone 7.5mg  qd x5 days, 5mg  qd x2 days, achillis tendonitis cream-compound prn. Update CBC/diff

## 2018-06-02 NOTE — Assessment & Plan Note (Signed)
HTN, blood pressure is controlled, continue  Metoprolol 100mg  qd, Amlodipine 5mg  qd.

## 2018-06-02 NOTE — Patient Instructions (Addendum)
CBC/diff, CMP/eGFR, TSH, lipid panel. the Next appt:  4 months

## 2018-06-02 NOTE — Assessment & Plan Note (Signed)
CAD stable, no angina since last visited, continue prn NTG, Isosorbide 120mg  qd, Ranolasine 1000mg  qd.

## 2018-06-02 NOTE — Assessment & Plan Note (Signed)
Continue Lipitor 10mg  qd, update lipids.

## 2018-06-02 NOTE — Assessment & Plan Note (Addendum)
Hypothyroidism, continue Levothyroxine 72mcg qd, last TSH 2.56 06/24/17, update TSH

## 2018-06-02 NOTE — Assessment & Plan Note (Addendum)
CHF/edema, stable, continue Furosemide 30m qd and daily prn. CMP/eGFR

## 2018-06-03 ENCOUNTER — Encounter: Payer: Self-pay | Admitting: Nurse Practitioner

## 2018-06-07 ENCOUNTER — Other Ambulatory Visit: Payer: Medicare Other

## 2018-06-07 ENCOUNTER — Other Ambulatory Visit: Payer: Self-pay

## 2018-06-09 LAB — COMPLETE METABOLIC PANEL WITH GFR
AG Ratio: 1.9 (calc) (ref 1.0–2.5)
ALT: 7 U/L (ref 6–29)
AST: 10 U/L (ref 10–35)
Albumin: 3.9 g/dL (ref 3.6–5.1)
Alkaline phosphatase (APISO): 50 U/L (ref 37–153)
BUN/Creatinine Ratio: 22 (calc) (ref 6–22)
BUN: 27 mg/dL — ABNORMAL HIGH (ref 7–25)
CO2: 20 mmol/L (ref 20–32)
Calcium: 8.9 mg/dL (ref 8.6–10.4)
Chloride: 106 mmol/L (ref 98–110)
Creat: 1.23 mg/dL — ABNORMAL HIGH (ref 0.60–0.93)
GFR, Est African American: 49 mL/min/{1.73_m2} — ABNORMAL LOW (ref >=60)
GFR, Est Non African American: 42 mL/min/{1.73_m2} — ABNORMAL LOW (ref >=60)
Globulin: 2.1 g/dL (calc) (ref 1.9–3.7)
Glucose, Bld: 89 mg/dL (ref 65–99)
Potassium: 3.9 mmol/L (ref 3.5–5.3)
Sodium: 142 mmol/L (ref 135–146)
Total Bilirubin: 0.3 mg/dL (ref 0.2–1.2)
Total Protein: 6 g/dL — ABNORMAL LOW (ref 6.1–8.1)

## 2018-06-09 LAB — TEST AUTHORIZATION

## 2018-06-09 LAB — LIPID PANEL
Cholesterol: 159 mg/dL (ref <200)
HDL: 76 mg/dL (ref >=50)
LDL Cholesterol (Calc): 65 mg/dL (calc)
Non-HDL Cholesterol (Calc): 83 mg/dL (calc) (ref <130)
Total CHOL/HDL Ratio: 2.1 (calc) (ref <5.0)
Triglycerides: 96 mg/dL (ref <150)

## 2018-06-09 LAB — TSH: TSH: 1.33 mIU/L (ref 0.40–4.50)

## 2018-06-10 ENCOUNTER — Encounter: Payer: Self-pay | Admitting: Nurse Practitioner

## 2018-06-10 DIAGNOSIS — N183 Chronic kidney disease, stage 3 unspecified: Secondary | ICD-10-CM | POA: Insufficient documentation

## 2018-06-16 ENCOUNTER — Other Ambulatory Visit: Payer: Self-pay | Admitting: Nurse Practitioner

## 2018-06-16 ENCOUNTER — Other Ambulatory Visit: Payer: Self-pay | Admitting: Interventional Cardiology

## 2018-06-16 DIAGNOSIS — F339 Major depressive disorder, recurrent, unspecified: Secondary | ICD-10-CM

## 2018-06-17 ENCOUNTER — Other Ambulatory Visit: Payer: Self-pay | Admitting: *Deleted

## 2018-06-17 MED ORDER — LEVOCETIRIZINE DIHYDROCHLORIDE 5 MG PO TABS: 5.0000 mg | ORAL_TABLET | Freq: Every evening | ORAL | 2 refills | Status: DC

## 2018-06-20 ENCOUNTER — Other Ambulatory Visit: Payer: Self-pay | Admitting: Nurse Practitioner

## 2018-07-17 ENCOUNTER — Other Ambulatory Visit: Payer: Self-pay

## 2018-07-17 ENCOUNTER — Encounter (HOSPITAL_COMMUNITY): Payer: Self-pay | Admitting: Emergency Medicine

## 2018-07-17 ENCOUNTER — Emergency Department (HOSPITAL_COMMUNITY): Payer: Medicare Other

## 2018-07-17 ENCOUNTER — Observation Stay (HOSPITAL_COMMUNITY)
Admission: EM | Admit: 2018-07-17 | Discharge: 2018-07-19 | Disposition: A | Discharge status: Home / Self Care | Payer: Medicare Other | Attending: Internal Medicine | Admitting: Internal Medicine

## 2018-07-17 DIAGNOSIS — Z955 Presence of coronary angioplasty implant and graft: Secondary | ICD-10-CM | POA: Insufficient documentation

## 2018-07-17 DIAGNOSIS — R55 Syncope and collapse: Principal | ICD-10-CM | POA: Insufficient documentation

## 2018-07-17 DIAGNOSIS — M353 Polymyalgia rheumatica: Secondary | ICD-10-CM | POA: Insufficient documentation

## 2018-07-17 DIAGNOSIS — I5032 Chronic diastolic (congestive) heart failure: Secondary | ICD-10-CM | POA: Insufficient documentation

## 2018-07-17 DIAGNOSIS — Z7902 Long term (current) use of antithrombotics/antiplatelets: Secondary | ICD-10-CM | POA: Diagnosis not present

## 2018-07-17 DIAGNOSIS — N183 Chronic kidney disease, stage 3 (moderate): Secondary | ICD-10-CM | POA: Diagnosis not present

## 2018-07-17 DIAGNOSIS — Z8249 Family history of ischemic heart disease and other diseases of the circulatory system: Secondary | ICD-10-CM | POA: Diagnosis not present

## 2018-07-17 DIAGNOSIS — F329 Major depressive disorder, single episode, unspecified: Secondary | ICD-10-CM | POA: Insufficient documentation

## 2018-07-17 DIAGNOSIS — Z7951 Long term (current) use of inhaled steroids: Secondary | ICD-10-CM | POA: Insufficient documentation

## 2018-07-17 DIAGNOSIS — E785 Hyperlipidemia, unspecified: Secondary | ICD-10-CM | POA: Diagnosis not present

## 2018-07-17 DIAGNOSIS — K580 Irritable bowel syndrome with diarrhea: Secondary | ICD-10-CM | POA: Insufficient documentation

## 2018-07-17 DIAGNOSIS — K219 Gastro-esophageal reflux disease without esophagitis: Secondary | ICD-10-CM | POA: Insufficient documentation

## 2018-07-17 DIAGNOSIS — Z8673 Personal history of transient ischemic attack (TIA), and cerebral infarction without residual deficits: Secondary | ICD-10-CM | POA: Diagnosis not present

## 2018-07-17 DIAGNOSIS — Z79899 Other long term (current) drug therapy: Secondary | ICD-10-CM | POA: Insufficient documentation

## 2018-07-17 DIAGNOSIS — Z79890 Hormone replacement therapy (postmenopausal): Secondary | ICD-10-CM | POA: Insufficient documentation

## 2018-07-17 DIAGNOSIS — D649 Anemia, unspecified: Secondary | ICD-10-CM | POA: Diagnosis not present

## 2018-07-17 DIAGNOSIS — I700 Atherosclerosis of aorta: Secondary | ICD-10-CM | POA: Insufficient documentation

## 2018-07-17 DIAGNOSIS — I5030 Unspecified diastolic (congestive) heart failure: Secondary | ICD-10-CM | POA: Diagnosis present

## 2018-07-17 DIAGNOSIS — M1990 Unspecified osteoarthritis, unspecified site: Secondary | ICD-10-CM | POA: Diagnosis not present

## 2018-07-17 DIAGNOSIS — Z7952 Long term (current) use of systemic steroids: Secondary | ICD-10-CM | POA: Diagnosis not present

## 2018-07-17 DIAGNOSIS — E039 Hypothyroidism, unspecified: Secondary | ICD-10-CM | POA: Insufficient documentation

## 2018-07-17 DIAGNOSIS — Z87891 Personal history of nicotine dependence: Secondary | ICD-10-CM | POA: Insufficient documentation

## 2018-07-17 DIAGNOSIS — I2510 Atherosclerotic heart disease of native coronary artery without angina pectoris: Secondary | ICD-10-CM | POA: Diagnosis present

## 2018-07-17 DIAGNOSIS — G4730 Sleep apnea, unspecified: Secondary | ICD-10-CM | POA: Diagnosis present

## 2018-07-17 DIAGNOSIS — I130 Hypertensive heart and chronic kidney disease with heart failure and stage 1 through stage 4 chronic kidney disease, or unspecified chronic kidney disease: Secondary | ICD-10-CM | POA: Insufficient documentation

## 2018-07-17 DIAGNOSIS — Z951 Presence of aortocoronary bypass graft: Secondary | ICD-10-CM | POA: Insufficient documentation

## 2018-07-17 DIAGNOSIS — G4733 Obstructive sleep apnea (adult) (pediatric): Secondary | ICD-10-CM | POA: Insufficient documentation

## 2018-07-17 LAB — CBC WITH DIFFERENTIAL/PLATELET
Abs Immature Granulocytes: 0.09 10*3/uL — ABNORMAL HIGH (ref 0.00–0.07)
Basophils Absolute: 0 10*3/uL (ref 0.0–0.1)
Basophils Relative: 0 %
Eosinophils Absolute: 0 10*3/uL (ref 0.0–0.5)
Eosinophils Relative: 0 %
HCT: 33.9 % — ABNORMAL LOW (ref 36.0–46.0)
Hemoglobin: 10.6 g/dL — ABNORMAL LOW (ref 12.0–15.0)
Immature Granulocytes: 1 %
Lymphocytes Relative: 7 %
Lymphs Abs: 1 10*3/uL (ref 0.7–4.0)
MCH: 31 pg (ref 26.0–34.0)
MCHC: 31.3 g/dL (ref 30.0–36.0)
MCV: 99.1 fL (ref 80.0–100.0)
Monocytes Absolute: 0.6 10*3/uL (ref 0.1–1.0)
Monocytes Relative: 4 %
Neutro Abs: 12.7 10*3/uL — ABNORMAL HIGH (ref 1.7–7.7)
Neutrophils Relative %: 88 %
Platelets: 230 10*3/uL (ref 150–400)
RBC: 3.42 MIL/uL — ABNORMAL LOW (ref 3.87–5.11)
RDW: 14.1 % (ref 11.5–15.5)
WBC: 14.4 10*3/uL — ABNORMAL HIGH (ref 4.0–10.5)
nRBC: 0 % (ref 0.0–0.2)

## 2018-07-17 LAB — BASIC METABOLIC PANEL
Anion gap: 7 (ref 5–15)
BUN: 31 mg/dL — ABNORMAL HIGH (ref 8–23)
CO2: 24 mmol/L (ref 22–32)
Calcium: 8.3 mg/dL — ABNORMAL LOW (ref 8.9–10.3)
Chloride: 108 mmol/L (ref 98–111)
Creatinine, Ser: 1.34 mg/dL — ABNORMAL HIGH (ref 0.44–1.00)
GFR calc Af Amer: 44 mL/min — ABNORMAL LOW (ref >60)
GFR calc non Af Amer: 38 mL/min — ABNORMAL LOW (ref >60)
Glucose, Bld: 133 mg/dL — ABNORMAL HIGH (ref 70–99)
Potassium: 4.4 mmol/L (ref 3.5–5.1)
Sodium: 139 mmol/L (ref 135–145)

## 2018-07-17 LAB — URINALYSIS, ROUTINE W REFLEX MICROSCOPIC
Bilirubin Urine: NEGATIVE
Glucose, UA: NEGATIVE mg/dL
Hgb urine dipstick: NEGATIVE
Ketones, ur: NEGATIVE mg/dL
Leukocytes,Ua: NEGATIVE
Nitrite: NEGATIVE
Protein, ur: NEGATIVE mg/dL
Specific Gravity, Urine: 1.009 (ref 1.005–1.030)
pH: 5 (ref 5.0–8.0)

## 2018-07-17 LAB — HEPATIC FUNCTION PANEL
ALT: 10 U/L (ref 0–44)
AST: 14 U/L — ABNORMAL LOW (ref 15–41)
Albumin: 3.4 g/dL — ABNORMAL LOW (ref 3.5–5.0)
Alkaline Phosphatase: 41 U/L (ref 38–126)
Bilirubin, Direct: 0.1 mg/dL (ref 0.0–0.2)
Indirect Bilirubin: 0.2 mg/dL — ABNORMAL LOW (ref 0.3–0.9)
Total Bilirubin: 0.3 mg/dL (ref 0.3–1.2)
Total Protein: 6 g/dL — ABNORMAL LOW (ref 6.5–8.1)

## 2018-07-17 LAB — TROPONIN I: Troponin I: <0.03 ng/mL (ref <0.03)

## 2018-07-17 NOTE — ED Triage Notes (Signed)
Pt presents by [facility] for evaluation of feeling tired and per spouse on scene pt was difficult to arouse but once EMS arrived pt was alert and oriented x4.

## 2018-07-17 NOTE — ED Notes (Signed)
Bed: CH36 Expected date:  Expected time:  Means of arrival:  Comments: EMS 78 yo female from home/husband states patient dozed off and could not wake her up-patient finally woke up after checked by staff RN-SBP 80-patient now alert and states she is tired

## 2018-07-17 NOTE — ED Provider Notes (Signed)
[facility] DEPT Provider Note   CSN: [id number] Arrival date & time: 07/17/18  2056    History   Chief Complaint Chief Complaint  Patient presents with   Loss of Consciousness    HPI Erin Ingram is a 78 y.o. female.     The history is provided by the patient.  Loss of Consciousness  Episode history:  Single Most recent episode:  Today Timing:  Unable to specify Progression:  Resolved Chronicity:  Recurrent Context: sitting down   Witnessed: yes   Relieved by: fluids. Worsened by:  Nothing Associated symptoms: confusion (resolved)   Associated symptoms: no anxiety, no chest pain, no difficulty breathing, no fever, no focal sensory loss, no focal weakness, no nausea, no palpitations, no rectal bleeding, no seizures, no shortness of breath, no vomiting and no weakness   Risk factors: coronary artery disease     Past Medical History:  Diagnosis Date   Anemia    Arthritis    "fingers, back, shoulders, hips" (04/10/2016)   Chronic diastolic CHF (congestive heart failure) (HCC)    a. normal EF, LVEDP at [facility] in 6/17 33 >> Lasix started    CKD (chronic kidney disease), stage III (HCC)    Coronary artery disease    a. s/p CABG 2000 (SVG/ free LIMA Y graft to the diagonal and distal LAD, SVG to the OM 1, and SVG to the PDA) . b. Cath 12/19/2014 90% dSVG to RCA s/p 2 overlapping DES. c. LHC 2016, 2017 no intervention except diuresis needed. d. Low risk nuc 03/2016. e. Cath 12/2017 patent LIMA-LAD, SVG-OM, SVG-PDA but occluded SVG to diag.    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 L by cath note in 2016.   TIA  (transient ischemic attack)     Patient Active Problem List   Diagnosis Date Noted   Kidney disease, chronic, stage III (moderate, EGFR 30-59 ml/min) (HCC) 06/10/2018   Constipation 06/02/2018   Allergic rhinitis 06/02/2018   Osteoporosis 06/02/2018   Leukocytosis 06/25/2017   Tremor 06/15/2017   Gait abnormality 06/15/2017   Polymyalgia rheumatica ([place]) 06/11/2017   Fall 06/11/2017   Visual hallucination 06/10/2017   GERD (gastroesophageal reflux disease) 06/10/2017   Hypothyroidism 06/10/2017   Abnormal movement 06/10/2017   Dysphagia 07/27/2016   Irritable bowel syndrome with diarrhea 07/27/2016   Chest pain 04/10/2016   Unstable angina pectoris ([place]) 04/10/2016   Bilateral lower extremity edema 03/13/2016   (HFpEF) heart failure with preserved ejection fraction ([place]) 10/08/2015   CAD (coronary artery disease) 10/08/2015   Chest pain at rest    Subclavian arterial stenosis (HCC)    Dyslipidemia    Hyperlipidemia LDL goal <70    PAD (peripheral artery disease) ([place]) 04/11/2015   Essential hypertension 04/11/2014   Morbid obesity ([place]) 03/22/2013   Anemia, unspecified 03/22/2013   Depression, recurrent ([place]) 03/22/2013   Generalized osteoarthrosis, unspecified site 03/22/2013   Sleep apnea 03/22/2013   Impaired fasting glucose 03/22/2013   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   Advanced care planning/counseling discussion 03/22/2013    Past Surgical History:  Procedure Laterality Date   BREAST BIOPSY Right 1964   CARDIAC CATHETERIZATION  08   patent grafts, no culprit lesions, EF 65%   CARDIAC CATHETERIZATION N/A 12/19/2014   Procedure: Left Heart Cath and Cors/Grafts Angiography;  Surgeon: Jettie Booze, MD;  Location: [facility];  Service: Cardiovascular;  Laterality: N/A;   CARDIAC CATHETERIZATION N/A 12/19/2014   Procedure: Coronary Stent Intervention;  Surgeon: Jettie Booze, MD;  Location: [facility];  Service: Cardiovascular;  Laterality: N/A;   CARDIAC CATHETERIZATION N/A 01/01/2015   Procedure: Left Heart Cath and Cors/Grafts Angiography;  Surgeon: Jettie Booze, MD;  Location: [facility];  Service: Cardiovascular;  Laterality: N/A;   CARDIAC CATHETERIZATION N/A 09/20/2015   Procedure: Left Heart Cath and Cors/Grafts Angiography;  Surgeon: Jettie Booze, MD;  Location: [facility];  Service: Cardiovascular;  Laterality: N/A;   CATARACT EXTRACTION W/ INTRAOCULAR LENS  IMPLANT, BILATERAL Bilateral 2011   COLONOSCOPY WITH PROPOFOL N/A 07/27/2016   Procedure: COLONOSCOPY WITH PROPOFOL;  Surgeon: Wilford Corner, MD;  Location: [facility];  Service: Endoscopy;  Laterality: N/A;   CORONARY ARTERY BYPASS GRAFT  2000   ASCVD, multivessel, S./P.   DILATION AND CURETTAGE OF UTERUS  1980s   ESOPHAGOGASTRODUODENOSCOPY (EGD) WITH PROPOFOL N/A 07/27/2016   Procedure: ESOPHAGOGASTRODUODENOSCOPY (EGD) WITH PROPOFOL;  Surgeon: Wilford Corner, MD;  Location: [facility];  Service: Endoscopy;  Laterality: N/A;   FRACTURE SURGERY     LEFT HEART CATH AND CORS/GRAFTS ANGIOGRAPHY N/A 01/12/2018   Procedure: LEFT HEART CATH AND CORS/GRAFTS ANGIOGRAPHY;  Surgeon: Jettie Booze, MD;  Location: [facility];  Service: Cardiovascular;  Laterality: N/A;   PATELLA FRACTURE SURGERY Left 1993     OB History   No obstetric history on file.      Home Medications    Prior to Admission medications   Medication Sig Start Date End Date Taking? Authorizing Provider  alendronate (FOSAMAX) 70 MG tablet Take 70 mg by mouth once a week. Take with a full glass of water on an empty stomach.    [provider]  amLODipine (NORVASC) 5 MG tablet TAKE 1 TABLET BY MOUTH DAILY. 06/16/18   Jettie Booze, MD  atorvastatin (LIPITOR) 10 MG tablet Take 10 mg by mouth daily.    [provider]  Cholecalciferol (VITAMIN D PO) Take 2,000 Units by mouth  daily.     [provider]  Cinnamon 500 MG TABS Take 1 each by mouth daily.    [provider]  clopidogrel (PLAVIX) 75 MG tablet TAKE 1 TABLET ONCE DAILY. 06/16/18   Jettie Booze, MD  Coenzyme Q10 10 MG capsule Take 10 mg by mouth at bedtime.     [provider]  fluticasone (FLONASE) 50 MCG/ACT nasal spray Place 2 sprays into both nostrils daily as needed for allergies.     [provider]  furosemide (LASIX) 20 MG tablet TAKE 1 TABLET ONCE DAILY. 06/16/18   Jettie Booze, MD  Glucosamine-Chondroit-Vit C-Mn (GLUCOSAMINE 1500 COMPLEX) CAPS Take 2 capsules by mouth daily after breakfast.    [provider]  isosorbide mononitrate (IMDUR) 60 MG 24 hr tablet Take 2 tablets (120 mg total) by mouth daily. 03/21/18   Jettie Booze, MD  levocetirizine (XYZAL) 5 MG tablet Take 1 tablet (5 mg total) by mouth every evening. 06/17/18   Mast, Man X, NP  levothyroxine (SYNTHROID, LEVOTHROID) 75 MCG tablet TAKE 1 TABLET ONCE DAILY  ON EMPTY STOMACH. 06/16/18   Mast, Man X, NP  metoprolol succinate (TOPROL-XL) 100 MG 24 hr tablet TAKE 1 TABLET DAILY WITH OR IMMEDIATELY FOLLOWING A MEAL. Patient taking differently: Take 100 mg by mouth daily.  12/17/17   Lyda Jester M, PA-C  mirabegron ER (MYRBETRIQ) 50 MG TB24 tablet Take 1 tablet (50 mg total) by mouth daily. 02/07/18   Mast, Man X, NP  nitroGLYCERIN (NITROSTAT) 0.4 MG SL tablet Place 0.4 mg under the tongue every 5 (five) minutes as needed for chest pain. X 3 doses    [provider]  NONFORMULARY OR COMPOUNDED [facility]:  Achilles Tendonitis Cream - Diclofenac 3%, Baclofen 2%, Bupivacaine 1%, GAbapentin 6%, Ibuprofen 3%, Pentoxifylline 3%, apply 1-2 grams to affected area 3-4 times daily. Patient taking differently: as needed. [facility]:  Achilles Tendonitis Cream - Diclofenac 3%, Baclofen 2%, Bupivacaine 1%, GAbapentin 6%, Ibuprofen 3%, Pentoxifylline 3%, apply  1-2 grams to affected area 3-4 times daily. 02/28/16   Edrick Kins, DPM  predniSONE (DELTASONE) 5 MG tablet Take 7.5 mg by mouth daily. Take 5 mg on Sun and Wednesday.With food or milk by mouth once daily.    [provider]  PSYLLIUM PO Take 6 capsules by mouth at bedtime.     [provider]  ranitidine (ZANTAC) 150 MG tablet TAKE 1 TABLET BY MOUTH TWICE DAILY. 06/20/18   Mast, Man X, NP  ranolazine (RANEXA) 1000 MG SR tablet TAKE 1 TABLET BY MOUTH TWICE DAILY. 03/07/18   Jettie Booze, MD  venlafaxine XR (EFFEXOR-XR) 75 MG 24 hr capsule TAKE 3 CAPSULES ONCE A DAY. 06/16/18   Mast, Man X, NP    Family History Family History  Problem Relation Age of Onset   Heart disease Mother    Heart attack Mother    Heart disease Father    Heart attack Father    Diabetes Brother    Pulmonary embolism Brother    Stroke Paternal Grandmother    Hypertension Neg Hx     Social History Social History   Tobacco Use   Smoking status: Former Smoker    Packs/day: 0.50    Years: 7.00    Pack years: 3.50    Types: Cigarettes    Last attempt to quit: 08/21/1969    Years since quitting: 48.9   Smokeless tobacco: Never Used  Substance Use Topics   Alcohol use: No    Alcohol/week: 0.0 standard drinks    Frequency: Never    Comment: rare   Drug use: No     Allergies   Nsaids; Butorphanol; Sulfa antibiotics; Bisoprolol fumarate; Butorphanol tartrate; Codeine; Demerol [meperidine]; Imipramine; Meperidine and related; Statins; Tequin [gatifloxacin]; Brilinta [ticagrelor]; Pseudoephedrine hcl; Septra [sulfamethoxazole-trimethoprim]; and Sulfamethoxazole-trimethoprim   Review of Systems Review of Systems  Constitutional: Negative for chills and fever.  HENT: Negative for ear pain and sore throat.   Eyes: Negative for pain and visual disturbance.  Respiratory: Negative for cough and shortness of breath.   Cardiovascular: Positive for syncope. Negative for chest  pain and palpitations.  Gastrointestinal: Negative for abdominal pain, nausea and vomiting.  Genitourinary: Negative for dysuria and hematuria.  Musculoskeletal: Negative for arthralgias and back pain.  Skin: Negative for color change and rash.  Neurological: Positive for syncope and light-headedness. Negative for focal weakness, seizures and weakness.  Psychiatric/Behavioral: Positive for confusion (resolved).  All other systems reviewed and are negative.    Physical Exam Updated Vital Signs  ED Triage Vitals [07/17/18 2105]  Enc Vitals Group     BP (!) 107/53     Pulse Rate (!) 57     Resp 18     Temp 97.8 F (36.6 C)     Temp Source Oral     SpO2 100 %     Weight 200 lb (90.7 kg)     Height '4\' 10"'$  (1.473 m)     Head Circumference      Peak Flow      Pain Score      Pain Loc      Pain Edu?      Excl. in [place]?     Physical Exam Vitals signs and nursing note reviewed.  Constitutional:      General: She is not in acute distress.    Appearance: She is well-developed.  HENT:     Head: Normocephalic and atraumatic.     Nose: Nose normal.     Mouth/Throat:     Mouth: Mucous membranes are moist.  Eyes:     Extraocular Movements: Extraocular movements intact.     Conjunctiva/sclera: Conjunctivae normal.     Pupils: Pupils are equal, round, and reactive to light.  Neck:     Musculoskeletal: Neck supple.  Cardiovascular:     Rate and Rhythm: Normal rate and regular rhythm.     Pulses: Normal pulses.     Heart sounds: Normal heart sounds. No murmur.  Pulmonary:     Effort: Pulmonary effort is normal. No respiratory distress.     Breath sounds: Normal breath sounds.  Abdominal:     Palpations: Abdomen is soft.     Tenderness: There is no abdominal tenderness.  Skin:    General: Skin is warm and dry.  Neurological:     General: No focal deficit present.     Mental Status: She is alert and oriented to person, place, and time.     Cranial Nerves: No cranial nerve  deficit.     Sensory: No sensory deficit.     Motor: No weakness.     Coordination: Coordination normal.     Gait: Gait normal.     Comments: 5+/5 strength, normal sensation, no drift, normal finger to nose finger      ED Treatments / Results  Labs (all labs ordered are listed, but only abnormal results are displayed) Labs Reviewed  CBC WITH DIFFERENTIAL/PLATELET - Abnormal; Notable for the following components:      Result Value   WBC 14.4 (*)    RBC 3.42 (*)    Hemoglobin 10.6 (*)    HCT 33.9 (*)    Neutro Abs 12.7 (*)    Abs Immature Granulocytes 0.09 (*)    All other components within normal limits  BASIC METABOLIC PANEL - Abnormal; Notable for the following components:   Glucose, Bld 133 (*)    BUN 31 (*)    Creatinine, Ser 1.34 (*)    Calcium 8.3 (*)    GFR calc non Af Amer 38 (*)    GFR calc Af Amer 44 (*)    All other components within normal limits  HEPATIC FUNCTION PANEL - Abnormal; Notable for the following components:   Total Protein 6.0 (*)    Albumin 3.4 (*)    AST 14 (*)    Indirect Bilirubin 0.2 (*)    All other components within normal limits  URINE CULTURE  TROPONIN I  URINALYSIS, ROUTINE W REFLEX MICROSCOPIC    EKG EKG Interpretation  Date/Time:  Sunday  July 17 2018 21:08:10 EDT Ventricular Rate:  57 PR Interval:    QRS Duration: 93 QT Interval:  484 QTC Calculation: 472 R Axis:   -9 Text Interpretation:  Sinus rhythm Borderline prolonged PR interval Low voltage, extremity and precordial leads Confirmed by Lennice Sites [phone]) on 07/17/2018 9:19:43 PM   Radiology Dg Chest 2 View  Result Date: 07/17/2018 CLINICAL DATA:  Initial evaluation for acute syncope. EXAM: CHEST - 2 VIEW COMPARISON:  Prior radiograph from 01/11/2018 FINDINGS: Median sternotomy wires underlying CABG markers and surgical clips noted. Stable cardiomegaly. Mediastinal silhouette normal. Aortic atherosclerosis. Lungs mildly hypoinflated. Mild diffuse pulmonary vascular  congestion without overt pulmonary edema. No definite pleural effusion. No focal infiltrates. No pneumothorax. No acute osseous finding. IMPRESSION: 1. Cardiomegaly with mild diffuse pulmonary vascular congestion without overt pulmonary edema. 2. Aortic atherosclerosis. Electronically Signed   By: Jeannine Boga M.D.   On: 07/17/2018 21:43   Ct Head Wo Contrast  Result Date: 07/17/2018 CLINICAL DATA:  78 year old female with involuntary movement of arms. EXAM: CT HEAD WITHOUT CONTRAST TECHNIQUE: Contiguous axial images were obtained from the base of the skull through the vertex without intravenous contrast. COMPARISON:  Head and cervical spine CT 04/04/2017 and earlier. Brain MRI 01/07/2016. FINDINGS: Brain: Stable cerebral volume since 2019. Chronic white matter hypodensity and heterogeneity in the deep gray matter nuclei. Stable gray-white matter differentiation throughout the brain. No midline shift, ventriculomegaly, mass effect, evidence of mass lesion, intracranial hemorrhage or evidence of cortically based acute infarction. No cortical encephalomalacia identified. Vascular: Calcified atherosclerosis at the skull base. No suspicious intracranial vascular hyperdensity. Skull: No acute osseous abnormality identified. Sinuses/Orbits: Visualized paranasal sinuses and mastoids are stable and well pneumatized. Other: No acute orbit or scalp soft tissue findings. IMPRESSION: Stable non contrast CT appearance of the brain since 2019 with chronic small vessel disease. Electronically Signed   By: Genevie Ann M.D.   On: 07/17/2018 23:17    Procedures Procedures (including critical care time)  Medications Ordered in ED Medications - No data to display   Initial Impression / Assessment and Plan / ED Course  I have reviewed the triage vital signs and the nursing notes.  Pertinent labs & imaging results that were available during my care of the patient were reviewed by me and considered in my medical  decision making (see chart for details).     ACHAIA GARLOCK is a 78 year old female with history of high cholesterol, CKD, heart failure, CAD presents the ED after syncopal episode.  Patient with normal vitals.  No fever.  EKG shows sinus rhythm.  No signs of ischemic changes.  Patient was sitting watching TV with her husband when all of a sudden she became unresponsive.  She did not fall.  She had some tremors in her upper extremities and had some difficulty communicating but overall was unresponsive.  She lives at independent nursing facility.  When nursing aide came to her apartment she is found to have blood pressure in the 80s.  EMS confirmed this and gave her 250 cc of fluid and vitals have been normal since.  Mentation has now improved.  She is basically at her baseline now.  She denies any chest pain, shortness of breath.  She is neurologically intact.  Concern for possible arrhythmic event versus medication side effect versus dehydration.  We will get basic labs including chest x-ray, head CT. Pt is DNR.  Chest x-ray showed no signs of pneumonia, pneumothorax, pleural effusion.  Patient with mild leukocytosis  but otherwise lab work with no signs of urinary tract infection, anemia, electrolyte abnormality, kidney injury.  CT of the head unremarkable.  Overall work-up with no acute findings.  Given her risk factors and concerning story of syncope that appears unprovoked will admit for further work-up.  Hemodynamically stable, care.  This chart was dictated using voice recognition software.  Despite best efforts to proofread,  errors can occur which can change the documentation meaning.    Final Clinical Impressions(s) / ED Diagnoses   Final diagnoses:  Syncope, unspecified syncope type    ED Discharge Orders    None       Lennice Sites, DO 07/17/18 2338

## 2018-07-18 ENCOUNTER — Observation Stay (HOSPITAL_BASED_OUTPATIENT_CLINIC_OR_DEPARTMENT_OTHER): Payer: Medicare Other

## 2018-07-18 DIAGNOSIS — R55 Syncope and collapse: Secondary | ICD-10-CM | POA: Diagnosis present

## 2018-07-18 LAB — TSH: TSH: 0.775 u[IU]/mL (ref 0.350–4.500)

## 2018-07-18 LAB — CBC
HCT: 32.7 % — ABNORMAL LOW (ref 36.0–46.0)
Hemoglobin: 10.2 g/dL — ABNORMAL LOW (ref 12.0–15.0)
MCH: 30.6 pg (ref 26.0–34.0)
MCHC: 31.2 g/dL (ref 30.0–36.0)
MCV: 98.2 fL (ref 80.0–100.0)
Platelets: 224 10*3/uL (ref 150–400)
RBC: 3.33 MIL/uL — ABNORMAL LOW (ref 3.87–5.11)
RDW: 13.9 % (ref 11.5–15.5)
WBC: 12.1 10*3/uL — ABNORMAL HIGH (ref 4.0–10.5)
nRBC: 0 % (ref 0.0–0.2)

## 2018-07-18 LAB — BASIC METABOLIC PANEL
Anion gap: 8 (ref 5–15)
BUN: 29 mg/dL — ABNORMAL HIGH (ref 8–23)
CO2: 23 mmol/L (ref 22–32)
Calcium: 8.5 mg/dL — ABNORMAL LOW (ref 8.9–10.3)
Chloride: 108 mmol/L (ref 98–111)
Creatinine, Ser: 1.12 mg/dL — ABNORMAL HIGH (ref 0.44–1.00)
GFR calc Af Amer: 54 mL/min — ABNORMAL LOW (ref >60)
GFR calc non Af Amer: 47 mL/min — ABNORMAL LOW (ref >60)
Glucose, Bld: 100 mg/dL — ABNORMAL HIGH (ref 70–99)
Potassium: 3.8 mmol/L (ref 3.5–5.1)
Sodium: 139 mmol/L (ref 135–145)

## 2018-07-18 LAB — URINE CULTURE

## 2018-07-18 LAB — MRSA PCR SCREENING: MRSA by PCR: NEGATIVE

## 2018-07-18 MED ORDER — CLOPIDOGREL BISULFATE 75 MG PO TABS
75.0000 mg | ORAL_TABLET | Freq: Every day | ORAL | Status: DC
  Administered 2018-07-18 – 2018-07-19 (×2): 75 mg via ORAL
  Filled 2018-07-18 (×2): qty 1

## 2018-07-18 MED ORDER — LEVOTHYROXINE SODIUM 75 MCG PO TABS
75.0000 ug | ORAL_TABLET | Freq: Every day | ORAL | Status: DC
  Administered 2018-07-18 – 2018-07-19 (×2): 75 ug via ORAL
  Filled 2018-07-18 (×2): qty 3
  Filled 2018-07-18 (×2): qty 1

## 2018-07-18 MED ORDER — MIRABEGRON ER 25 MG PO TB24
50.0000 mg | ORAL_TABLET | Freq: Every day | ORAL | Status: DC
  Administered 2018-07-18 – 2018-07-19 (×2): 50 mg via ORAL
  Filled 2018-07-18 (×2): qty 2

## 2018-07-18 MED ORDER — RANOLAZINE ER 500 MG PO TB12: 1000.0000 mg | ORAL_TABLET | Freq: Two times a day (BID) | ORAL | Status: DC

## 2018-07-18 MED ORDER — FAMOTIDINE 20 MG PO TABS
20.0000 mg | ORAL_TABLET | Freq: Every day | ORAL | Status: DC
  Administered 2018-07-18 – 2018-07-19 (×2): 20 mg via ORAL
  Filled 2018-07-18 (×2): qty 1

## 2018-07-18 MED ORDER — FAMOTIDINE 20 MG PO TABS: 20.0000 mg | ORAL_TABLET | Freq: Two times a day (BID) | ORAL | Status: DC

## 2018-07-18 MED ORDER — PREDNISONE 5 MG PO TABS
7.5000 mg | ORAL_TABLET | Freq: Every day | ORAL | Status: DC
  Administered 2018-07-18 – 2018-07-19 (×2): 7.5 mg via ORAL
  Filled 2018-07-18 (×2): qty 1

## 2018-07-18 MED ORDER — ENOXAPARIN SODIUM 40 MG/0.4ML ~~LOC~~ SOLN
40.0000 mg | SUBCUTANEOUS | Status: DC
  Administered 2018-07-18 – 2018-07-19 (×2): 40 mg via SUBCUTANEOUS
  Filled 2018-07-18 (×2): qty 0.4

## 2018-07-18 MED ORDER — NITROGLYCERIN 0.4 MG SL SUBL: 0.4000 mg | SUBLINGUAL_TABLET | SUBLINGUAL | Status: DC | PRN

## 2018-07-18 MED ORDER — SODIUM CHLORIDE 0.9% FLUSH
3.0000 mL | Freq: Two times a day (BID) | INTRAVENOUS | Status: DC
  Administered 2018-07-18 – 2018-07-19 (×3): 3 mL via INTRAVENOUS

## 2018-07-18 MED ORDER — VENLAFAXINE HCL ER 150 MG PO CP24
150.0000 mg | ORAL_CAPSULE | Freq: Every day | ORAL | Status: DC
  Administered 2018-07-18 – 2018-07-19 (×2): 150 mg via ORAL
  Filled 2018-07-18 (×2): qty 1

## 2018-07-18 MED ORDER — ATORVASTATIN CALCIUM 10 MG PO TABS
10.0000 mg | ORAL_TABLET | Freq: Every day | ORAL | Status: DC
  Administered 2018-07-18 – 2018-07-19 (×2): 10 mg via ORAL
  Filled 2018-07-18 (×2): qty 1

## 2018-07-18 NOTE — Progress Notes (Signed)
  Echocardiogram 2D Echocardiogram has been performed.  Darlina Sicilian M 07/18/2018, 10:01 AM

## 2018-07-18 NOTE — Progress Notes (Signed)
Pt refuses to wear nocturnal cpap.  Pt is aware that RT is available all night should she change her mind.

## 2018-07-18 NOTE — ED Notes (Signed)
ED TO INPATIENT HANDOFF REPORT  ED Nurse Name and Phone #: Kallie Locks [phone]  S Name/Age/Gender Erin Ingram 78 y.o. female Room/Bed: WA14/WA14  Code Status   Code Status: DNR  Home/SNF/Other Home Patient oriented to: self Is this baseline? Yes   Triage Complete: Triage complete  Chief Complaint Fatigue  Triage Note Pt presents by [facility] for evaluation of feeling tired and per spouse on scene pt was difficult to arouse but once EMS arrived pt was alert and oriented x4.   Allergies Allergies  Allergen Reactions  . Nsaids Other (See Comments)    Due to chronic kidney failure  . Butorphanol Other (See Comments)    agitation Constipation Produced a lot of urine, made patient feel crazy  . Sulfa Antibiotics Rash  . Bisoprolol Fumarate Other (See Comments)    Doesn't remember  . Butorphanol Tartrate Other (See Comments)    Produced a lot of urine, made patient feel crazy  . Codeine Nausea And Vomiting  . Demerol [Meperidine] Nausea And Vomiting  . Imipramine     Sweating, facial dysfunction   . Meperidine And Related Nausea And Vomiting  . Statins     MYALGIAS  . Tequin [Gatifloxacin] Other (See Comments)    Caused hypoglycemia Low blood sugar  . Brilinta [Ticagrelor] Rash    CAUSES PETECHIAE, PURPURA  . Pseudoephedrine Hcl Palpitations  . Septra [Sulfamethoxazole-Trimethoprim] Rash  . Sulfamethoxazole-Trimethoprim Rash    Level of Care/Admitting Diagnosis ED Disposition    ED Disposition Condition Comment   Admit  Hospital Area: [place] [[id number]]  Level of Care: Telemetry [5]  Admit to tele based on following criteria: Eval of Syncope  Covid Evaluation: N/A  Diagnosis: Syncope [315176]  Admitting Physician: Vilma Prader [[id number]]  Attending Physician: Vilma Prader [[id number]]  PT Class (Do Not Modify): Observation [104]  PT Acc Code (Do Not Modify): Observation [10022]       B Medical/Surgery History Past Medical  History:  Diagnosis Date  . Anemia   . Arthritis    "fingers, back, shoulders, hips" (04/10/2016)  . Chronic diastolic CHF (congestive heart failure) (HCC)    a. normal EF, LVEDP at [facility] in 6/17 33 >> Lasix started   . CKD (chronic kidney disease), stage III ([place])   . Coronary artery disease    a. s/p CABG 2000 (SVG/ free LIMA Y graft to the diagonal and distal LAD, SVG to the OM 1, and SVG to the PDA) . b. Cath 12/19/2014 90% dSVG to RCA s/p 2 overlapping DES. c. LHC 2016, 2017 no intervention except diuresis needed. d. Low risk nuc 03/2016. e. Cath 12/2017 patent LIMA-LAD, SVG-OM, SVG-PDA but occluded SVG to diag.   . Depression    with anxious component  . GERD (gastroesophageal reflux disease)   . History of hiatal hernia   . Hyperlipidemia   . Hypothyroidism   . Obesity   . OSA on CPAP   . PMR (polymyalgia rheumatica) (HCC)   . Pneumonia    "2-3 times" (04/10/2016)  . Stable angina (HCC)    microvascular, improved with Ranexa  . Subclavian artery stenosis (HCC)    a. L by cath note in 2016.  Marland Kitchen TIA (transient ischemic attack)    Past Surgical History:  Procedure Laterality Date  . BREAST BIOPSY Right 1964  . CARDIAC CATHETERIZATION  08   patent grafts, no culprit lesions, EF 65%  . CARDIAC CATHETERIZATION N/A 12/19/2014   Procedure: Left Heart Cath and Cors/Grafts  Angiography;  Surgeon: Jettie Booze, MD;  Location: [place] [facility];  Service: Cardiovascular;  Laterality: N/A;  . CARDIAC CATHETERIZATION N/A 12/19/2014   Procedure: Coronary Stent Intervention;  Surgeon: Jettie Booze, MD;  Location: [place] [facility];  Service: Cardiovascular;  Laterality: N/A;  . CARDIAC CATHETERIZATION N/A 01/01/2015   Procedure: Left Heart Cath and Cors/Grafts Angiography;  Surgeon: Jettie Booze, MD;  Location: [place] [facility];  Service: Cardiovascular;  Laterality: N/A;  . CARDIAC CATHETERIZATION N/A 09/20/2015   Procedure: Left Heart Cath and Cors/Grafts Angiography;   Surgeon: Jettie Booze, MD;  Location: [place] [facility];  Service: Cardiovascular;  Laterality: N/A;  . CATARACT EXTRACTION W/ INTRAOCULAR LENS  IMPLANT, BILATERAL Bilateral 2011  . COLONOSCOPY WITH PROPOFOL N/A 07/27/2016   Procedure: COLONOSCOPY WITH PROPOFOL;  Surgeon: Wilford Corner, MD;  Location: [place];  Service: Endoscopy;  Laterality: N/A;  . CORONARY ARTERY BYPASS GRAFT  2000   ASCVD, multivessel, S./P.  . DILATION AND CURETTAGE OF UTERUS  1980s  . ESOPHAGOGASTRODUODENOSCOPY (EGD) WITH PROPOFOL N/A 07/27/2016   Procedure: ESOPHAGOGASTRODUODENOSCOPY (EGD) WITH PROPOFOL;  Surgeon: Wilford Corner, MD;  Location: [place];  Service: Endoscopy;  Laterality: N/A;  . FRACTURE SURGERY    . LEFT HEART CATH AND CORS/GRAFTS ANGIOGRAPHY N/A 01/12/2018   Procedure: LEFT HEART CATH AND CORS/GRAFTS ANGIOGRAPHY;  Surgeon: Jettie Booze, MD;  Location: [place] [facility];  Service: Cardiovascular;  Laterality: N/A;  . PATELLA FRACTURE SURGERY Left 1993     A IV Location/Drains/Wounds Patient Lines/Drains/Airways Status   Active Line/Drains/Airways    Name:   Placement date:   Placement time:   Site:   Days:   Peripheral IV 07/17/18 Left Antecubital   07/17/18    2056    Antecubital   1          Intake/Output Last 24 hours No intake or output data in the 24 hours ending 07/18/18 0147  Labs/Imaging Results for orders placed or performed during the hospital encounter of 07/17/18 (from the past 48 hour(s))  CBC with Differential     Status: Abnormal   Collection Time: 07/17/18  9:57 PM  Result Value Ref Range   WBC 14.4 (H) 4.0 - 10.5 K/uL   RBC 3.42 (L) 3.87 - 5.11 MIL/uL   Hemoglobin 10.6 (L) 12.0 - 15.0 g/dL   HCT 33.9 (L) 36.0 - 46.0 %   MCV 99.1 80.0 - 100.0 fL   MCH 31.0 26.0 - 34.0 pg   MCHC 31.3 30.0 - 36.0 g/dL   RDW 14.1 11.5 - 15.5 %   Platelets 230 150 - 400 K/uL   nRBC 0.0 0.0 - 0.2 %   Neutrophils Relative % 88 %   Neutro Abs 12.7 (H) 1.7 -  7.7 K/uL   Lymphocytes Relative 7 %   Lymphs Abs 1.0 0.7 - 4.0 K/uL   Monocytes Relative 4 %   Monocytes Absolute 0.6 0.1 - 1.0 K/uL   Eosinophils Relative 0 %   Eosinophils Absolute 0.0 0.0 - 0.5 K/uL   Basophils Relative 0 %   Basophils Absolute 0.0 0.0 - 0.1 K/uL   Immature Granulocytes 1 %   Abs Immature Granulocytes 0.09 (H) 0.00 - 0.07 K/uL    Comment: Performed at [facility], [place] [address], [place], [place] [address]  Basic metabolic panel     Status: Abnormal   Collection Time: 07/17/18  9:57 PM  Result Value Ref Range   Sodium  139 135 - 145 mmol/L   Potassium 4.4 3.5 - 5.1 mmol/L   Chloride 108 98 - 111 mmol/L   CO2 24 22 - 32 mmol/L   Glucose, Bld 133 (H) 70 - 99 mg/dL   BUN 31 (H) 8 - 23 mg/dL   Creatinine, Ser 1.34 (H) 0.44 - 1.00 mg/dL   Calcium 8.3 (L) 8.9 - 10.3 mg/dL   GFR calc non Af Amer 38 (L) >60 mL/min   GFR calc Af Amer 44 (L) >60 mL/min   Anion gap 7 5 - 15    Comment: Performed at [facility], [place] [address], [place], [place] [address]  Hepatic function panel     Status: Abnormal   Collection Time: 07/17/18  9:57 PM  Result Value Ref Range   Total Protein 6.0 (L) 6.5 - 8.1 g/dL   Albumin 3.4 (L) 3.5 - 5.0 g/dL   AST 14 (L) 15 - 41 U/L   ALT 10 0 - 44 U/L   Alkaline Phosphatase 41 38 - 126 U/L   Total Bilirubin 0.3 0.3 - 1.2 mg/dL   Bilirubin, Direct 0.1 0.0 - 0.2 mg/dL   Indirect Bilirubin 0.2 (L) 0.3 - 0.9 mg/dL    Comment: Performed at [facility], [place] [address], [place], [place] [address]  Troponin I - ONCE - STAT     Status: None   Collection Time: 07/17/18  9:57 PM  Result Value Ref Range   Troponin I <0.03 <0.03 ng/mL    Comment: Performed at [facility], [place] [address], [place], [place] [address]  Urinalysis, Routine w reflex microscopic     Status: None   Collection Time: 07/17/18 10:04 PM  Result Value Ref Range   Color, Urine YELLOW YELLOW   APPearance  CLEAR CLEAR   Specific Gravity, Urine 1.009 1.005 - 1.030   pH 5.0 5.0 - 8.0   Glucose, UA NEGATIVE NEGATIVE mg/dL   Hgb urine dipstick NEGATIVE NEGATIVE   Bilirubin Urine NEGATIVE NEGATIVE   Ketones, ur NEGATIVE NEGATIVE mg/dL   Protein, ur NEGATIVE NEGATIVE mg/dL   Nitrite NEGATIVE NEGATIVE   Leukocytes,Ua NEGATIVE NEGATIVE    Comment: Performed at [place] [address], [place],  [address]   Dg Chest 2 View  Result Date: 07/17/2018 CLINICAL DATA:  Initial evaluation for acute syncope. EXAM: CHEST - 2 VIEW COMPARISON:  Prior radiograph from 01/11/2018 FINDINGS: Median sternotomy wires underlying CABG markers and surgical clips noted. Stable cardiomegaly. Mediastinal silhouette normal. Aortic atherosclerosis. Lungs mildly hypoinflated. Mild diffuse pulmonary vascular congestion without overt pulmonary edema. No definite pleural effusion. No focal infiltrates. No pneumothorax. No acute osseous finding. IMPRESSION: 1. Cardiomegaly with mild diffuse pulmonary vascular congestion without overt pulmonary edema. 2. Aortic atherosclerosis. Electronically Signed   By: Jeannine Boga M.D.   On: 07/17/2018 21:43   Ct Head Wo Contrast  Result Date: 07/17/2018 CLINICAL DATA:  78 year old female with involuntary movement of arms. EXAM: CT HEAD WITHOUT CONTRAST TECHNIQUE: Contiguous axial images were obtained from the base of the skull through the vertex without intravenous contrast. COMPARISON:  Head and cervical spine CT 04/04/2017 and earlier. Brain MRI 01/07/2016. FINDINGS: Brain: Stable cerebral volume since 2019. Chronic white matter hypodensity and heterogeneity in the deep gray matter nuclei. Stable gray-white matter differentiation throughout the brain. No midline shift, ventriculomegaly, mass effect, evidence of mass lesion, intracranial hemorrhage or evidence of cortically based acute infarction. No cortical encephalomalacia identified. Vascular: Calcified  atherosclerosis  at the skull base. No suspicious intracranial vascular hyperdensity. Skull: No acute osseous abnormality identified. Sinuses/Orbits: Visualized paranasal sinuses and mastoids are stable and well pneumatized. Other: No acute orbit or scalp soft tissue findings. IMPRESSION: Stable non contrast CT appearance of the brain since 2019 with chronic small vessel disease. Electronically Signed   By: Genevie Ann M.D.   On: 07/17/2018 23:17    Pending Labs Unresulted Labs (From admission, onward)    Start     Ordered   07/25/18 0500  Creatinine, serum  (enoxaparin (LOVENOX)    CrCl >/= 30 ml/min)  Weekly,   R    Comments:  while on enoxaparin therapy    07/18/18 0004   07/18/18 0500  TSH  Tomorrow morning,   R     07/18/18 0004   07/18/18 4132  Basic metabolic panel  Tomorrow morning,   R     07/18/18 0004   07/18/18 0500  CBC  Tomorrow morning,   R     07/18/18 0004   07/17/18 2117  Urine culture  ONCE - STAT,   STAT     07/17/18 2116          Vitals/Pain Today's Vitals   07/18/18 0000 07/18/18 0030 07/18/18 0100 07/18/18 0130  BP: 100/79 (!) 102/52 (!) 109/54 (!) 109/57  Pulse: 68 66 69 69  Resp: 17 18 19 17   Temp:      TempSrc:      SpO2: 100% 100% 98% 99%  Weight:      Height:      PainSc:        Isolation Precautions No active isolations  Medications Medications  atorvastatin (LIPITOR) tablet 10 mg (has no administration in time range)  nitroGLYCERIN (NITROSTAT) SL tablet 0.4 mg (has no administration in time range)  venlafaxine XR (EFFEXOR-XR) 24 hr capsule 150 mg (has no administration in time range)  levothyroxine (SYNTHROID) tablet 75 mcg (has no administration in time range)  predniSONE (DELTASONE) tablet 7.5 mg (has no administration in time range)  mirabegron ER (MYRBETRIQ) tablet 50 mg (has no administration in time range)  clopidogrel (PLAVIX) tablet 75 mg (has no administration in time range)  enoxaparin (LOVENOX) injection 40 mg (has no administration  in time range)  sodium chloride flush (NS) 0.9 % injection 3 mL (has no administration in time range)  famotidine (PEPCID) tablet 20 mg (has no administration in time range)    Mobility walks Low fall risk   Focused Assessments Neuro Assessment Handoff:  Swallow screen pass? N/A Cardiac Rhythm: Sinus bradycardia   Last date known well: 07/17/18 Last time known well: 1800 Neuro Assessment: Exceptions to WDL Neuro Checks:      Last Documented NIHSS Modified Score:   Has TPA been given? No If patient is a Neuro Trauma and patient is going to OR before floor call report to [place] nurse: [phone] or [phone]     R Recommendations: See Admitting Provider Note  Report given to:   Additional Notes: none

## 2018-07-18 NOTE — ED Notes (Signed)
ED TO INPATIENT HANDOFF REPORT  ED Nurse Name and Phone #: Kallie Locks [phone]  S Name/Age/Gender Erin Ingram 78 y.o. female Room/Bed: WA14/WA14  Code Status   Code Status: DNR  Home/SNF/Other Home Patient oriented to: self Is this baseline? Yes   Triage Complete: Triage complete  Chief Complaint Fatigue  Triage Note Pt presents by [facility] for evaluation of feeling tired and per spouse on scene pt was difficult to arouse but once EMS arrived pt was alert and oriented x4.   Allergies Allergies  Allergen Reactions  . Nsaids Other (See Comments)    Due to chronic kidney failure  . Butorphanol Other (See Comments)    agitation Constipation Produced a lot of urine, made patient feel crazy  . Sulfa Antibiotics Rash  . Bisoprolol Fumarate Other (See Comments)    Doesn't remember  . Butorphanol Tartrate Other (See Comments)    Produced a lot of urine, made patient feel crazy  . Codeine Nausea And Vomiting  . Demerol [Meperidine] Nausea And Vomiting  . Imipramine     Sweating, facial dysfunction   . Meperidine And Related Nausea And Vomiting  . Statins     MYALGIAS  . Tequin [Gatifloxacin] Other (See Comments)    Caused hypoglycemia Low blood sugar  . Brilinta [Ticagrelor] Rash    CAUSES PETECHIAE, PURPURA  . Pseudoephedrine Hcl Palpitations  . Septra [Sulfamethoxazole-Trimethoprim] Rash  . Sulfamethoxazole-Trimethoprim Rash    Level of Care/Admitting Diagnosis ED Disposition    ED Disposition Condition Comment   Admit  Hospital Area: [place] [[id number]]  Level of Care: Telemetry [5]  Admit to tele based on following criteria: Eval of Syncope  Covid Evaluation: N/A  Diagnosis: Syncope [748270]  Admitting Physician: Vilma Prader [[id number]]  Attending Physician: Vilma Prader [[id number]]  PT Class (Do Not Modify): Observation [104]  PT Acc Code (Do Not Modify): Observation [10022]       B Medical/Surgery History Past Medical  History:  Diagnosis Date  . Anemia   . Arthritis    "fingers, back, shoulders, hips" (04/10/2016)  . Chronic diastolic CHF (congestive heart failure) (HCC)    a. normal EF, LVEDP at [facility] in 6/17 33 >> Lasix started   . CKD (chronic kidney disease), stage III ([place])   . Coronary artery disease    a. s/p CABG 2000 (SVG/ free LIMA Y graft to the diagonal and distal LAD, SVG to the OM 1, and SVG to the PDA) . b. Cath 12/19/2014 90% dSVG to RCA s/p 2 overlapping DES. c. LHC 2016, 2017 no intervention except diuresis needed. d. Low risk nuc 03/2016. e. Cath 12/2017 patent LIMA-LAD, SVG-OM, SVG-PDA but occluded SVG to diag.   . Depression    with anxious component  . GERD (gastroesophageal reflux disease)   . History of hiatal hernia   . Hyperlipidemia   . Hypothyroidism   . Obesity   . OSA on CPAP   . PMR (polymyalgia rheumatica) (HCC)   . Pneumonia    "2-3 times" (04/10/2016)  . Stable angina (HCC)    microvascular, improved with Ranexa  . Subclavian artery stenosis (HCC)    a. L by cath note in 2016.  Marland Kitchen TIA (transient ischemic attack)    Past Surgical History:  Procedure Laterality Date  . BREAST BIOPSY Right 1964  . CARDIAC CATHETERIZATION  08   patent grafts, no culprit lesions, EF 65%  . CARDIAC CATHETERIZATION N/A 12/19/2014   Procedure: Left Heart Cath and Cors/Grafts  Angiography;  Surgeon: Jettie Booze, MD;  Location: [place] CV LAB;  Service: Cardiovascular;  Laterality: N/A;  . CARDIAC CATHETERIZATION N/A 12/19/2014   Procedure: Coronary Stent Intervention;  Surgeon: Jettie Booze, MD;  Location: [place] [facility];  Service: Cardiovascular;  Laterality: N/A;  . CARDIAC CATHETERIZATION N/A 01/01/2015   Procedure: Left Heart Cath and Cors/Grafts Angiography;  Surgeon: Jettie Booze, MD;  Location: [place] [facility] LAB;  Service: Cardiovascular;  Laterality: N/A;  . CARDIAC CATHETERIZATION N/A 09/20/2015   Procedure: Left Heart Cath and Cors/Grafts Angiography;   Surgeon: Jettie Booze, MD;  Location: [place] [facility] LAB;  Service: Cardiovascular;  Laterality: N/A;  . CATARACT EXTRACTION W/ INTRAOCULAR LENS  IMPLANT, BILATERAL Bilateral 2011  . COLONOSCOPY WITH PROPOFOL N/A 07/27/2016   Procedure: COLONOSCOPY WITH PROPOFOL;  Surgeon: Wilford Corner, MD;  Location: [place];  Service: Endoscopy;  Laterality: N/A;  . CORONARY ARTERY BYPASS GRAFT  2000   ASCVD, multivessel, S./P.  . DILATION AND CURETTAGE OF UTERUS  1980s  . ESOPHAGOGASTRODUODENOSCOPY (EGD) WITH PROPOFOL N/A 07/27/2016   Procedure: ESOPHAGOGASTRODUODENOSCOPY (EGD) WITH PROPOFOL;  Surgeon: Wilford Corner, MD;  Location: [place];  Service: Endoscopy;  Laterality: N/A;  . FRACTURE SURGERY    . LEFT HEART CATH AND CORS/GRAFTS ANGIOGRAPHY N/A 01/12/2018   Procedure: LEFT HEART CATH AND CORS/GRAFTS ANGIOGRAPHY;  Surgeon: Jettie Booze, MD;  Location: [place] [facility];  Service: Cardiovascular;  Laterality: N/A;  . PATELLA FRACTURE SURGERY Left 1993     A IV Location/Drains/Wounds Patient Lines/Drains/Airways Status   Active Line/Drains/Airways    Name:   Placement date:   Placement time:   Site:   Days:   Peripheral IV 07/17/18 Left Antecubital   07/17/18    2056    Antecubital   1          Intake/Output Last 24 hours No intake or output data in the 24 hours ending 07/18/18 0022  Labs/Imaging Results for orders placed or performed during the hospital encounter of 07/17/18 (from the past 48 hour(s))  CBC with Differential     Status: Abnormal   Collection Time: 07/17/18  9:57 PM  Result Value Ref Range   WBC 14.4 (H) 4.0 - 10.5 K/uL   RBC 3.42 (L) 3.87 - 5.11 MIL/uL   Hemoglobin 10.6 (L) 12.0 - 15.0 g/dL   HCT 33.9 (L) 36.0 - 46.0 %   MCV 99.1 80.0 - 100.0 fL   MCH 31.0 26.0 - 34.0 pg   MCHC 31.3 30.0 - 36.0 g/dL   RDW 14.1 11.5 - 15.5 %   Platelets 230 150 - 400 K/uL   nRBC 0.0 0.0 - 0.2 %   Neutrophils Relative % 88 %   Neutro Abs 12.7 (H) 1.7 -  7.7 K/uL   Lymphocytes Relative 7 %   Lymphs Abs 1.0 0.7 - 4.0 K/uL   Monocytes Relative 4 %   Monocytes Absolute 0.6 0.1 - 1.0 K/uL   Eosinophils Relative 0 %   Eosinophils Absolute 0.0 0.0 - 0.5 K/uL   Basophils Relative 0 %   Basophils Absolute 0.0 0.0 - 0.1 K/uL   Immature Granulocytes 1 %   Abs Immature Granulocytes 0.09 (H) 0.00 - 0.07 K/uL    Comment: Performed at [facility], [place] [address], [place], [place] [address]  Basic metabolic panel     Status: Abnormal   Collection Time: 07/17/18  9:57 PM  Result Value Ref Range   Sodium  139 135 - 145 mmol/L   Potassium 4.4 3.5 - 5.1 mmol/L   Chloride 108 98 - 111 mmol/L   CO2 24 22 - 32 mmol/L   Glucose, Bld 133 (H) 70 - 99 mg/dL   BUN 31 (H) 8 - 23 mg/dL   Creatinine, Ser 1.34 (H) 0.44 - 1.00 mg/dL   Calcium 8.3 (L) 8.9 - 10.3 mg/dL   GFR calc non Af Amer 38 (L) >60 mL/min   GFR calc Af Amer 44 (L) >60 mL/min   Anion gap 7 5 - 15    Comment: Performed at [facility], [place] [address] [place], [place] [address]  Hepatic function panel     Status: Abnormal   Collection Time: 07/17/18  9:57 PM  Result Value Ref Range   Total Protein 6.0 (L) 6.5 - 8.1 g/dL   Albumin 3.4 (L) 3.5 - 5.0 g/dL   AST 14 (L) 15 - 41 U/L   ALT 10 0 - 44 U/L   Alkaline Phosphatase 41 38 - 126 U/L   Total Bilirubin 0.3 0.3 - 1.2 mg/dL   Bilirubin, Direct 0.1 0.0 - 0.2 mg/dL   Indirect Bilirubin 0.2 (L) 0.3 - 0.9 mg/dL    Comment: Performed at [facility], [place] [address], [place], [place] [address]  Troponin I - ONCE - STAT     Status: None   Collection Time: 07/17/18  9:57 PM  Result Value Ref Range   Troponin I <0.03 <0.03 ng/mL    Comment: Performed at [facility], [place] [address] [place], [place] [address]  Urinalysis, Routine w reflex microscopic     Status: None   Collection Time: 07/17/18 10:04 PM  Result Value Ref Range   Color, Urine YELLOW YELLOW   APPearance  CLEAR CLEAR   Specific Gravity, Urine 1.009 1.005 - 1.030   pH 5.0 5.0 - 8.0   Glucose, UA NEGATIVE NEGATIVE mg/dL   Hgb urine dipstick NEGATIVE NEGATIVE   Bilirubin Urine NEGATIVE NEGATIVE   Ketones, ur NEGATIVE NEGATIVE mg/dL   Protein, ur NEGATIVE NEGATIVE mg/dL   Nitrite NEGATIVE NEGATIVE   Leukocytes,Ua NEGATIVE NEGATIVE    Comment: Performed at [place] [address] [place],  [address]   Dg Chest 2 View  Result Date: 07/17/2018 CLINICAL DATA:  Initial evaluation for acute syncope. EXAM: CHEST - 2 VIEW COMPARISON:  Prior radiograph from 01/11/2018 FINDINGS: Median sternotomy wires underlying CABG markers and surgical clips noted. Stable cardiomegaly. Mediastinal silhouette normal. Aortic atherosclerosis. Lungs mildly hypoinflated. Mild diffuse pulmonary vascular congestion without overt pulmonary edema. No definite pleural effusion. No focal infiltrates. No pneumothorax. No acute osseous finding. IMPRESSION: 1. Cardiomegaly with mild diffuse pulmonary vascular congestion without overt pulmonary edema. 2. Aortic atherosclerosis. Electronically Signed   By: Jeannine Boga M.D.   On: 07/17/2018 21:43   Ct Head Wo Contrast  Result Date: 07/17/2018 CLINICAL DATA:  78 year old female with involuntary movement of arms. EXAM: CT HEAD WITHOUT CONTRAST TECHNIQUE: Contiguous axial images were obtained from the base of the skull through the vertex without intravenous contrast. COMPARISON:  Head and cervical spine CT 04/04/2017 and earlier. Brain MRI 01/07/2016. FINDINGS: Brain: Stable cerebral volume since 2019. Chronic white matter hypodensity and heterogeneity in the deep gray matter nuclei. Stable gray-white matter differentiation throughout the brain. No midline shift, ventriculomegaly, mass effect, evidence of mass lesion, intracranial hemorrhage or evidence of cortically based acute infarction. No cortical encephalomalacia identified. Vascular: Calcified  atherosclerosis  at the skull base. No suspicious intracranial vascular hyperdensity. Skull: No acute osseous abnormality identified. Sinuses/Orbits: Visualized paranasal sinuses and mastoids are stable and well pneumatized. Other: No acute orbit or scalp soft tissue findings. IMPRESSION: Stable non contrast CT appearance of the brain since 2019 with chronic small vessel disease. Electronically Signed   By: Genevie Ann M.D.   On: 07/17/2018 23:17    Pending Labs Unresulted Labs (From admission, onward)    Start     Ordered   07/25/18 0500  Creatinine, serum  (enoxaparin (LOVENOX)    CrCl >/= 30 ml/min)  Weekly,   R    Comments:  while on enoxaparin therapy    07/18/18 0004   07/18/18 0500  TSH  Tomorrow morning,   R     07/18/18 0004   07/18/18 0388  Basic metabolic panel  Tomorrow morning,   R     07/18/18 0004   07/18/18 0500  CBC  Tomorrow morning,   R     07/18/18 0004   07/17/18 2117  Urine culture  ONCE - STAT,   STAT     07/17/18 2116          Vitals/Pain Today's Vitals   07/17/18 2230 07/17/18 2300 07/17/18 2340 07/17/18 2342  BP: (!) 100/47 (!) 110/52 92/68 92/68   Pulse: (!) 55 60 72 69  Resp: 14 15 (!) 25 16  Temp:      TempSrc:      SpO2: 99% 100% 99% 100%  Weight:      Height:      PainSc:        Isolation Precautions No active isolations  Medications Medications  atorvastatin (LIPITOR) tablet 10 mg (has no administration in time range)  nitroGLYCERIN (NITROSTAT) SL tablet 0.4 mg (has no administration in time range)  venlafaxine XR (EFFEXOR-XR) 24 hr capsule 150 mg (has no administration in time range)  levothyroxine (SYNTHROID) tablet 75 mcg (has no administration in time range)  predniSONE (DELTASONE) tablet 7.5 mg (has no administration in time range)  mirabegron ER (MYRBETRIQ) tablet 50 mg (has no administration in time range)  clopidogrel (PLAVIX) tablet 75 mg (has no administration in time range)  enoxaparin (LOVENOX) injection 40 mg (has no  administration in time range)  sodium chloride flush (NS) 0.9 % injection 3 mL (has no administration in time range)  famotidine (PEPCID) tablet 20 mg (has no administration in time range)    Mobility walks Low fall risk   Focused Assessments Neuro Assessment Handoff:  Swallow screen pass? N/a Cardiac Rhythm: Sinus bradycardia   Last date known well: 07/17/18 Last time known well: 1800 Neuro Assessment: Exceptions to WDL Neuro Checks:      Last Documented NIHSS Modified Score:   Has TPA been given? No If patient is a Neuro Trauma and patient is going to OR before floor call report to [place] nurse: [phone] or [phone]     R Recommendations: See Admitting Provider Note  Report given to:   Additional Notes: none

## 2018-07-18 NOTE — Progress Notes (Signed)
CPAP offered to patient who states that she has a very small face and that our masks do not fit her correctly.  I offered to have her try several and she stated that it was to much trouble and really didn't want to wear.  She felt like she wouldn't sleep that much anyway.  Requested that she call if she changes her mind.

## 2018-07-18 NOTE — H&P (Signed)
History and Physical   Erin Ingram HBZ:[id number] DOB: 1940/07/25 DOA: 07/17/2018  PCP: Mast, Man X, NP  Chief Complaint: I think I passed out  Sources of history include chart review, discussion with the emergency medicine team, and patient provided history.  HPI: This is a 78 year old woman with medical problems including obesity, obstructive sleep apnea, chronic diastolic/preserved EF heart failure, coronary artery disease, PMR on steroids, and hypothyroidism presenting with syncope.  Per the emergency medicine team who discussed the case with the patient's spouse, the patient was sitting down when she became unresponsive, it was witnessed by her husband.  The patient reports she has had intermittent jerking of her fingers and arms, has seen neurology in the outpatient setting, reports not having a primary neurologic issue.  She denies prior history of syncope.  This episode was associated with diaphoresis.  She denies chest pain, shortness of breath, fevers, chills, dysuria.  She reports she stopped taking aspirin due to easy bruising.  She reports her muscle pain and weakness is improved on her current dose of prednisone.  At baseline, she lives with her husband in independent living, reports being independent in her ADLs, walks with a 4 wheeled walker.  She is a retired Database administrator, does not smoke.  EMS reportedly found blood pressure to be systolic blood pressures in the 80s, was given 250 cc of normal saline prior to arrival in the emergency department.  Patient denies any new recent medications.  Patient reports being sleepy more than usual during the day.  ED Course: In the emergency department initial vital signs remarkable for heart rate 57, blood pressure 107/53.  CBC with hemoglobin 10.6, total white count of 14.4.  CMP with creatinine of 1.34, potassium within normal limits.  Troponin undetectable.  EKG with low voltage sinus rhythm, borderline prolonged PR  interval.  CT scan of the head did not reveal acute findings.  Urinalysis with small leukocytes.  Chest x-ray with cardiomegaly with diffuse pulmonary vascular congestion, no overt pulmonary edema, atherosclerosis noted.  Hospital medicine was consulted for further management.  Review of Systems: A complete ROS was obtained; pertinent positives negatives are denoted in the HPI. Otherwise, all systems are negative.   Past Medical History:  Diagnosis Date  . Anemia   . Arthritis    "fingers, back, shoulders, hips" (04/10/2016)  . Chronic diastolic CHF (congestive heart failure) (HCC)    a. normal EF, LVEDP at [facility] in 6/17 33 >> Lasix started   . CKD (chronic kidney disease), stage III ([place])   . Coronary artery disease    a. s/p CABG 2000 (SVG/ free LIMA Y graft to the diagonal and distal LAD, SVG to the OM 1, and SVG to the PDA) . b. Cath 12/19/2014 90% dSVG to RCA s/p 2 overlapping DES. c. LHC 2016, 2017 no intervention except diuresis needed. d. Low risk nuc 03/2016. e. Cath 12/2017 patent LIMA-LAD, SVG-OM, SVG-PDA but occluded SVG to diag.   . Depression    with anxious component  . GERD (gastroesophageal reflux disease)   . History of hiatal hernia   . Hyperlipidemia   . Hypothyroidism   . Obesity   . OSA on CPAP   . PMR (polymyalgia rheumatica) (HCC)   . Pneumonia    "2-3 times" (04/10/2016)  . Stable angina (HCC)    microvascular, improved with Ranexa  . Subclavian artery stenosis (HCC)    a. L by cath note in 2016.  Marland Kitchen TIA (transient ischemic  attack)    Social History   Socioeconomic History  . Marital status: Married    Spouse name: Not on file  . Number of children: 2  . Years of education: College  . Highest education level: Not on file  Occupational History  . Occupation: Retired Cytogeneticist  Social Needs  . Financial resource strain: Not hard at all  . Food insecurity:    Worry: Never true    Inability: Never true  . Transportation needs:    Medical: No     Non-medical: No  Tobacco Use  . Smoking status: Former Smoker    Packs/day: 0.50    Years: 7.00    Pack years: 3.50    Types: Cigarettes    Last attempt to quit: 08/21/1969    Years since quitting: 48.9  . Smokeless tobacco: Never Used  Substance and Sexual Activity  . Alcohol use: No    Alcohol/week: 0.0 standard drinks    Frequency: Never    Comment: rare  . Drug use: No  . Sexual activity: Never  Lifestyle  . Physical activity:    Days per week: 0 days    Minutes per session: 0 min  . Stress: To some extent  Relationships  . Social connections:    Talks on phone: More than three times a week    Gets together: More than three times a week    Attends religious service: Never    Active member of club or organization: No    Attends meetings of clubs or organizations: Never    Relationship status: Married  . Intimate partner violence:    Fear of current or ex partner: No    Emotionally abused: No    Physically abused: No    Forced sexual activity: No  Other Topics Concern  . Not on file  Social History Narrative   Social History     Social History Narrative       Lives in [place] with husband.   Diet: Regular   Do you drink/eat things with caffeine? 1 cup caffeine per day.  Not much coffee   Marital status: Married                           What year were you married? 1964   Do you live in a house, apartment, assisted living, condo, trailer, etc)? Here at [facility]   Is it one or more stories?   How many persons live in your home? 2   Do you have any pets in your home? No   Current or past profession: Engineer, production (RN)   Do you exercise? I did                                                Type & how often: Swimming, Tai Chi, exercise classes   Do you have a living will? yes   Do you have a DNR Form? Yes   Do you have a POA/HPOA forms?             Family History  Problem Relation Age of Onset  . Heart disease Mother   . Heart attack Mother   . Heart  disease Father   . Heart attack Father   . Diabetes Brother   . Pulmonary  embolism Brother   . Stroke Paternal Grandmother   . Hypertension Neg Hx     Physical Exam: Vitals:   07/17/18 2230 07/17/18 2300 07/17/18 2340 07/17/18 2342  BP: (!) 100/47 (!) 1'10/52 92/68 92/68 '$  Pulse: (!) 55 60 72 69  Resp: 14 15 (!) 25 16  Temp:      TempSrc:      SpO2: 99% 100% 99% 100%  Weight:      Height:       General: Appears calm and comfortable, pleasant obese white woman ENT: Grossly normal hearing, MMM. Cardiovascular: RRR. No M/R/G. No LE edema.  Heart sounds distant.  No appreciable murmurs. Respiratory: CTA bilaterally. No wheezes or crackles. Normal respiratory effort.  Breathing room air. Abdomen: Soft, non-tender.   Generous pannus. Skin: No rash or induration seen on limited exam with exception of mild ecchymoses extensor surface of forearms Musculoskeletal: Grossly normal tone BUE/BLE. Appropriate ROM.  Requires significant hand-held assistance to sit up. Psychiatric: Grossly normal mood and affect. Neurologic: Moves all extremities in coordinated fashion.  I have personally reviewed the following labs, culture data, and imaging studies.  Assessment/Plan:  #Syncope Course: First episode of syncope that was transient while watching television, found to have SBP in 80s upon arrival of staff nurse and EMS. EKG on admission with low voltage, borderline prolonged PR interval, sinus rhythm. A/P: Many co-contributors possible, including polypharmacy (multiple anti-HTN medications), over-diuresis (although pulmonary vascular congestion noted on CXR). Will monitor with telemetry to evaluate for arrhythmia and obtain TTE to exclude structural heart disease. Estimation of volume status a challenge given obesity, for now will hold anti-HTN medications and loop diuretic.  Orthostatic vital signs ordered.  #Other problems: -CAD: continue home statin and clopidogrel -Chronic diastolic / preserved  EF: Hold medications as outlined above, re-assess in AM optimal timing of re-start and / or modification; hold ranolazine for now as it can contribute to hypotension / syncope -PMR: continue home prednisone at current dose of 7.5 mg daily -Hypothyroidism:Continue home medication, update TSH, although on 06/07/2018 it was WNL -Depression: continue venlafaxine -Urinary incontinence: continue home mirabegon -OSA: CPAP qhs -CKD III: Cr of 1.34 on admission, stable, montior -Obesity: recommend weight reduction  DVT prophylaxis: Subq Lovenox Code Status: DNR / DNI this was discussed on day of admission Disposition Plan: Anticipate D/C home  Consults called: none Admission status: admit to hospital medicine, telemetry floor   Cheri Rous, MD [facility] Page:[phone]  If 7PM-7AM, please contact night-coverage [email or web address] Password [email or web address]  This document was created using the aid of voice recognition / dication software, please excuse any sound alike substitutions, typographic, or transcription errors.

## 2018-07-18 NOTE — Progress Notes (Addendum)
  PROGRESS NOTE  Patient admitted earlier this morning. See H&P. Erin Ingram is a 78 year old woman with medical problems including obesity, obstructive sleep apnea, chronic diastolic/preserved EF heart failure, coronary artery disease, PMR on steroids, and hypothyroidism presenting with syncope.  Per the emergency medicine team who discussed the case with the patient's spouse, the patient was sitting down when she became unresponsive, it was witnessed by her husband.  The patient reports she has had intermittent jerking of her fingers and arms, has seen neurology in the outpatient setting, reports not having a primary neurologic issue.  She denies prior history of syncope.  This episode was associated with diaphoresis.  She denies chest pain, shortness of breath, fevers, chills, dysuria.  She reports she stopped taking aspirin due to easy bruising.  She reports her muscle pain and weakness is improved on her current dose of prednisone.  Patient seen and examined this morning.  She remains very groggy after being woken up.  No acute physical complaints.  Work-up for syncope.  Continue to hold the patient's anti-hypertensive medication, diuretic.  Echocardiogram pending, obtain orthostatic vital signs.    Dessa Phi, DO [facility] [email or web address] 07/18/2018, 10:28 AM

## 2018-07-18 NOTE — Progress Notes (Signed)
  Echocardiogram 2D Echocardiogram has been performed.  Erin Ingram M 07/18/2018, 10:00 AM

## 2018-07-19 DIAGNOSIS — R55 Syncope and collapse: Secondary | ICD-10-CM | POA: Diagnosis not present

## 2018-07-19 LAB — BASIC METABOLIC PANEL
Anion gap: 8 (ref 5–15)
BUN: 21 mg/dL (ref 8–23)
CO2: 24 mmol/L (ref 22–32)
Calcium: 8.3 mg/dL — ABNORMAL LOW (ref 8.9–10.3)
Chloride: 107 mmol/L (ref 98–111)
Creatinine, Ser: 1.11 mg/dL — ABNORMAL HIGH (ref 0.44–1.00)
GFR calc Af Amer: 55 mL/min — ABNORMAL LOW (ref >60)
GFR calc non Af Amer: 48 mL/min — ABNORMAL LOW (ref >60)
Glucose, Bld: 91 mg/dL (ref 70–99)
Potassium: 3.4 mmol/L — ABNORMAL LOW (ref 3.5–5.1)
Sodium: 139 mmol/L (ref 135–145)

## 2018-07-19 LAB — CBC
HCT: 33.6 % — ABNORMAL LOW (ref 36.0–46.0)
Hemoglobin: 10.6 g/dL — ABNORMAL LOW (ref 12.0–15.0)
MCH: 31.1 pg (ref 26.0–34.0)
MCHC: 31.5 g/dL (ref 30.0–36.0)
MCV: 98.5 fL (ref 80.0–100.0)
Platelets: 224 10*3/uL (ref 150–400)
RBC: 3.41 MIL/uL — ABNORMAL LOW (ref 3.87–5.11)
RDW: 13.7 % (ref 11.5–15.5)
WBC: 10.8 10*3/uL — ABNORMAL HIGH (ref 4.0–10.5)
nRBC: 0 % (ref 0.0–0.2)

## 2018-07-19 MED ORDER — POTASSIUM CHLORIDE CRYS ER 20 MEQ PO TBCR
40.0000 meq | EXTENDED_RELEASE_TABLET | Freq: Once | ORAL | Status: AC
  Administered 2018-07-19: 40 meq via ORAL
  Filled 2018-07-19: qty 2

## 2018-07-19 NOTE — TOC Transition Note (Signed)
Transition of Care [facility]) - CM/SW Discharge Note   Patient Details  Name: Erin Ingram MRN: [id number] Date of Birth: 1941-02-08  Transition of Care [facility]) CM/SW Contact:  Wende Neighbors, LCSW Phone Number: 07/19/2018, 9:56 AM   Clinical Narrative:   Patient is from [facility] section with spouse. CSW spoke with patient at bedside about discharge plans. Patient is aware per [facility] policy that she will need to go to the SNF side to isolate from her husband for 14 days. Patient agreeable to go to SNF via PTAR. CSW will continue to follow for support and discharge needs     Final next level of care: Skilled Nursing Facility Barriers to Discharge: Continued Medical Work up   Patient Goals and CMS Choice Patient states their goals for this hospitalization and ongoing recovery are:: to go home with husband CMS Medicare.gov Compare Post Acute Care list provided to:: Patient(from a facility and will return to snf side to isolate for 14 days) Choice offered to / list presented to : Patient  Discharge Placement                       Discharge Plan and Services In-house Referral: Clinical Social Work   Post Acute Care Choice: [place]                               Social Determinants of Health (SDOH) Interventions     Readmission Risk Interventions No flowsheet data found.

## 2018-07-19 NOTE — TOC Transition Note (Signed)
Transition of Care [facility]) - CM/SW Discharge Note   Patient Details  Name: Erin Ingram MRN: [id number] Date of Birth: Feb 16, 1941  Transition of Care [facility]) CM/SW Contact:  Wende Neighbors, LCSW Phone Number: 07/19/2018, 12:41 PM   Clinical Narrative:   Patient is discharging to [facility] and facility is picking patient up. RN to please call [phone] (rm# [id number]) for report     Final next level of care: Skilled Nursing Facility Barriers to Discharge: No Barriers Identified   Patient Goals and CMS Choice Patient states their goals for this hospitalization and ongoing recovery are:: to go home with husband CMS Medicare.gov Compare Post Acute Care list provided to:: Patient(from a facility and will return to snf side to isolate for 14 days) Choice offered to / list presented to : Patient  Discharge Placement PASRR number recieved: 07/19/18            Patient chooses bed at: [facility] Patient to be transferred to facility by: facility picking patient up Name of family member notified: patient cognitive and made husband aware of dc Patient and family notified of of transfer: 07/19/18  Discharge Plan and Services In-house Referral: Clinical Social Work   Post Acute Care Choice: [place]                               Social Determinants of Health (SDOH) Interventions     Readmission Risk Interventions No flowsheet data found.

## 2018-07-19 NOTE — Progress Notes (Addendum)
Patient given DNR & MOST forms in discharge packet.  All belongings gathered and placed in patient possession.  Patient picked up by [facility] transportation.  Gave report to Sharyn Lull, Therapist, sports at [facility].

## 2018-07-19 NOTE — Care Management Important Message (Signed)
Important Message  Patient Details  Name: Erin Ingram MRN: [id number] Date of Birth: 1941-03-05   Medicare Important Message Given:       Wende Neighbors, LCSW 07/19/2018, 9:45 AM

## 2018-07-19 NOTE — Evaluation (Signed)
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  Clinical Impression  Pt is at baseline of modified independence with mobility. She ambulated 200' with RW, no loss of balance. She is ready to DC from PT standpoint, no further PT indicated. Will sign off.     Follow Up Recommendations No PT follow up    Equipment Recommendations  None recommended by PT    Recommendations for Other Services       Precautions / Restrictions Precautions Precautions: Fall Precaution Comments: syncopal event just PTA Restrictions Weight Bearing Restrictions: No      Mobility  Bed Mobility Overal bed mobility: Modified Independent             General bed mobility comments: used bedrail  Transfers Overall transfer level: Modified independent Equipment used: Rolling walker (2 wheeled)                Ambulation/Gait Ambulation/Gait assistance: Modified independent (Device/Increase time) Gait Distance (Feet): 200 Feet Assistive device: Rolling walker (2 wheeled) Gait Pattern/deviations: WFL(Within Functional Limits) Gait velocity: WFL   General Gait Details: steady, no loss of balance, no dizziness  Stairs            Wheelchair Mobility    Modified Rankin (Stroke Patients Only)       Balance Overall balance assessment: Modified Independent                                           Pertinent Vitals/Pain Pain Assessment: No/denies pain    Home Living Family/patient expects to be discharged to:: Private residence Living Arrangements: Spouse/significant other   Type of Home: Independent living facility       Home Layout: One  level Home Equipment: Environmental consultant - 4 wheels      Prior Function Level of Independence: Independent with assistive device(s)         Comments: walked to dining room at facility, independent ADLs     Hand Dominance        Extremity/Trunk Assessment   Upper Extremity Assessment Upper Extremity Assessment: Overall WFL for tasks assessed    Lower Extremity Assessment Lower Extremity Assessment: Overall WFL for tasks assessed    Cervical / Trunk Assessment Cervical / Trunk Assessment: Normal  Communication   Communication: No difficulties  Cognition Arousal/Alertness: Awake/alert Behavior During Therapy: WFL for tasks assessed/performed Overall Cognitive Status: Within Functional Limits for tasks assessed                                        General Comments      Exercises     Assessment/Plan    PT Assessment Patent does not need any further PT services  PT Problem List         PT Treatment Interventions      PT Goals (Current goals can be found in the Care Plan section)  Acute Rehab PT Goals Patient Stated Goal: return to doing tai chi PT Goal Formulation: All assessment and education complete, DC therapy    Frequency  Barriers to discharge        Co-evaluation               AM-PAC PT "6 Clicks" Mobility  Outcome Measure Help needed turning from your back to your side while in a flat bed without using bedrails?: None Help needed moving from lying on your back to sitting on the side of a flat bed without using bedrails?: A Little Help needed moving to and from a bed to a chair (including a wheelchair)?: None Help needed standing up from a chair using your arms (e.g., wheelchair or bedside chair)?: None Help needed to walk in hospital room?: None Help needed climbing 3-5 steps with a railing? : A Little 6 Click Score: 22    End of Session Equipment Utilized During Treatment: Gait belt Activity Tolerance: Patient tolerated  treatment well Patient left: in chair;with call bell/phone within reach Nurse Communication: Mobility status PT Visit Diagnosis: History of falling (Z91.81)    Time: 6333-5456 PT Time Calculation (min) (ACUTE ONLY): 23 min   Charges:   PT Evaluation $PT Eval Low Complexity: 1 Low PT Treatments $Gait Training: 8-22 mins        Blondell Reveal Kistler PT 07/19/2018  Acute Rehabilitation Services Pager [phone] Office [phone]

## 2018-07-19 NOTE — NC FL2 (Signed)
[place] LEVEL OF CARE SCREENING TOOL     IDENTIFICATION  Patient Name: Erin Ingram Birthdate: [birth year] Sex: female Admission Date (Current Location): 07/17/2018  [facility] and [place] Number:  Herbalist and Address:  [facility],  [address] [place], [place]      Provider Number: [id number]  Attending Physician Name and Address:  Dessa Phi, DO  Relative Name and Phone Number:  Clova Morlock, [phone]    Current Level of Care: Hospital Recommended Level of Care: [place] Prior Approval Number:    Date Approved/Denied:   PASRR Number: [id number]  Discharge Plan: SNF    Current Diagnoses: Patient Active Problem List   Diagnosis Date Noted  . Syncope 07/18/2018  . Kidney disease, chronic, stage III (moderate, EGFR 30-59 ml/min) ([place]) 06/10/2018  . Constipation 06/02/2018  . Allergic rhinitis 06/02/2018  . Osteoporosis 06/02/2018  . Leukocytosis 06/25/2017  . Tremor 06/15/2017  . Gait abnormality 06/15/2017  . Polymyalgia rheumatica ([place]) 06/11/2017  . Fall 06/11/2017  . Visual hallucination 06/10/2017  . GERD (gastroesophageal reflux disease) 06/10/2017  . Hypothyroidism 06/10/2017  . Abnormal movement 06/10/2017  . Dysphagia 07/27/2016  . Irritable bowel syndrome with diarrhea 07/27/2016  . Chest pain 04/10/2016  . Unstable angina pectoris ([place]) 04/10/2016  . Bilateral lower extremity edema 03/13/2016  . (HFpEF) heart failure with preserved ejection fraction ([place]) 10/08/2015  . CAD (coronary artery disease) 10/08/2015  . Chest pain at rest   . 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 Dyslipidemia   . Hyperlipidemia LDL goal <70   . PAD (peripheral artery disease) ([place]) 04/11/2015  . Essential hypertension 04/11/2014  . Morbid obesity ([place]) 03/22/2013  . Anemia, unspecified 03/22/2013  . Depression, recurrent ([place]) 03/22/2013  . Generalized osteoarthrosis, unspecified site 03/22/2013   . Sleep apnea 03/22/2013  . Impaired fasting glucose 03/22/2013  . 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 Advanced care planning/counseling discussion 03/22/2013    Orientation RESPIRATION BLADDER Height & Weight     Self, Time, Situation, Place  Normal External catheter Weight: 197 lb 5 oz (89.5 kg) Height:  '4\' 10"'$  (147.3 cm)  BEHAVIORAL SYMPTOMS/MOOD NEUROLOGICAL BOWEL NUTRITION STATUS      Continent Diet(heart healthy)  AMBULATORY STATUS COMMUNICATION OF NEEDS Skin   Limited Assist Verbally Normal                       Personal Care Assistance Level of Assistance  Bathing, Feeding, Dressing Bathing Assistance: Limited assistance Feeding assistance: Independent Dressing Assistance: Limited assistance     Functional Limitations Info  Sight, Hearing, Speech Sight Info: Impaired Hearing Info: Adequate Speech Info: Adequate    SPECIAL CARE FACTORS FREQUENCY  PT (By licensed PT), OT (By licensed OT)     PT Frequency: 3x wk OT Frequency: 3x wk            Contractures Contractures Info: Not present    Additional Factors Info  Code Status, Allergies Code Status Info: dnr Allergies Info: NSAIDS, BUTORPHANOL, SULFA ANTIBIOTICS, BISOPROLOL FUMARATE, BUTORPHANOL TARTRATE, CODEINE, DEMEROL MEPERIDINE, IMIPRAMINE, MEPERIDINE AND RELATED, STATINS, TEQUIN GATIFLOXACIN, BRILINTA TICAGRELOR, PSEUDOEPHEDRINE HCL, SEPTRA SULFAMETHOXAZOLE-TRIMETHOPRIM, SULFAMETHOXAZOLE-TRIMETHOPRIM            Current Medications (07/19/2018):  This is the current hospital active medication list Current Facility-Administered Medications  Medication Dose Route Frequency Provider Last Rate Last Dose  . atorvastatin (LIPITOR) tablet 10 mg  10 mg Oral Daily Vilma Prader, MD  10 mg at 07/18/18 1047  . clopidogrel (PLAVIX) tablet 75 mg  75 mg Oral Daily Vilma Prader, MD   75 mg at 07/18/18 1047  . enoxaparin (LOVENOX) injection 40 mg  40 mg Subcutaneous Q24H Vilma Prader, MD    40 mg at 07/18/18 1047  . famotidine (PEPCID) tablet 20 mg  20 mg Oral Daily Vilma Prader, MD   20 mg at 07/18/18 1047  . levothyroxine (SYNTHROID) tablet 75 mcg  75 mcg Oral Q0600 Vilma Prader, MD   75 mcg at 07/19/18 0554  . mirabegron ER (MYRBETRIQ) tablet 50 mg  50 mg Oral Daily Vilma Prader, MD   50 mg at 07/18/18 1047  . nitroGLYCERIN (NITROSTAT) SL tablet 0.4 mg  0.4 mg Sublingual Q5 min PRN Vilma Prader, MD      . predniSONE (DELTASONE) tablet 7.5 mg  7.5 mg Oral Daily Vilma Prader, MD   7.5 mg at 07/18/18 1047  . sodium chloride flush (NS) 0.9 % injection 3 mL  3 mL Intravenous Q12H Vilma Prader, MD   3 mL at 07/18/18 2147  . venlafaxine XR (EFFEXOR-XR) 24 hr capsule 150 mg  150 mg Oral Q breakfast Vilma Prader, MD   150 mg at 07/19/18 7543     Discharge Medications: Please see discharge summary for a list of discharge medications.  Relevant Imaging Results:  Relevant Lab Results:   Additional Information SS# [id number]  Wende Neighbors, LCSW

## 2018-07-19 NOTE — Discharge Summary (Signed)
Physician Discharge Summary  Erin Ingram FWY:[id number] DOB: February 20, 1941 DOA: 07/17/2018  PCP: Mast, Man X, NP  Admit date: 07/17/2018 Discharge date: 07/19/2018  Admitted From: Home Disposition:  Home   Recommendations for Outpatient Follow-up:  1. Follow up with PCP in 1 week 2. 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 Per the emergency medicine team who discussed the case with the patient's spouse, the patient was sitting down when she became unresponsive, it was witnessed by her husband. The patient reports she has had intermittent jerking of her fingers and arms, has seen neurology in the outpatient setting, reports not having a primary neurologic issue. She denies prior history of syncope. This episode was associated with diaphoresis. She denies chest pain, shortness of breath, fevers, chills, dysuria. She reports she stopped taking aspirin due to easy bruising. She reports her muscle pain and weakness is improved on her current dose of prednisone.  Patient was observed and worked up for syncope.  Her antihypertensive medications and diuretic were held.  Orthostatic vital signs were negative.  Echocardiogram revealed normal EF 55%.  Patient continued to improve clinically and was discharged home with resumption of some of her cardiac medications.  She needs to follow-up with her primary care physician and cardiology as an outpatient for further titration of her medications.  Discharge Diagnoses:  Principal Problem:   Syncope Active Problems:   Sleep apnea   (HFpEF) heart failure with preserved ejection fraction (HCC)   CAD (coronary artery  disease)   Hypothyroidism   Discharge Instructions  Discharge Instructions    (HEART FAILURE PATIENTS) Call MD:  Anytime you have any of the following symptoms: 1) 3 pound weight gain in 24 hours or 5 pounds in 1 week 2) shortness of breath, with or without a dry hacking cough 3) swelling in the hands, feet or stomach 4) if you have to sleep on extra pillows at night in order to breathe.   Complete by:  As directed    Call MD for:  difficulty breathing, headache or visual disturbances   Complete by:  As directed    Call MD for:  extreme fatigue   Complete by:  As directed    Call MD for:  persistant dizziness or light-headedness   Complete by:  As directed    Call MD for:  persistant nausea and vomiting   Complete by:  As directed    Call MD for:  severe uncontrolled pain   Complete by:  As directed    Call MD for:  temperature >100.4   Complete by:  As directed    Diet - low sodium heart healthy   Complete by:  As directed    Discharge instructions   Complete by:  As directed    You were cared for by a hospitalist during your hospital stay. If you have any questions about your discharge medications or the care you received while you were in the hospital after you are discharged, you can call the unit and ask to speak with the hospitalist on call if the hospitalist that took care of you is not available. Once you are discharged, your primary care physician will handle any further medical issues. Please note that NO REFILLS  for any discharge medications will be authorized once you are discharged, as it is imperative that you return to your primary care physician (or establish a relationship with a primary care physician if you do not have one) for your aftercare needs so that they can reassess your need for medications and monitor your lab values.   Increase activity slowly   Complete by:  As directed      Allergies as of 07/19/2018      Reactions   Nsaids Other (See Comments)   Due to  chronic kidney failure   Butorphanol Other (See Comments)   agitation Constipation Produced a lot of urine, made patient feel crazy   Sulfa Antibiotics Rash   Bisoprolol Fumarate Other (See Comments)   Doesn't remember   Butorphanol Tartrate Other (See Comments)   Produced a lot of urine, made patient feel crazy   Codeine Nausea And Vomiting   Demerol [meperidine] Nausea And Vomiting   Imipramine    Sweating, facial dysfunction    Meperidine And Related Nausea And Vomiting   Statins    MYALGIAS   Tequin [gatifloxacin] Other (See Comments)   Caused hypoglycemia Low blood sugar   Brilinta [ticagrelor] Rash   CAUSES PETECHIAE, PURPURA   Pseudoephedrine Hcl Palpitations   Septra [sulfamethoxazole-trimethoprim] Rash   Sulfamethoxazole-trimethoprim Rash      Medication List    STOP taking these medications   amLODipine 5 MG tablet Commonly known as:  NORVASC   furosemide 20 MG tablet Commonly known as:  LASIX   NONFORMULARY OR COMPOUNDED ITEM   ranitidine 150 MG tablet Commonly known as:  ZANTAC     TAKE these medications   alendronate 70 MG tablet Commonly known as:  FOSAMAX Take 70 mg by mouth once a week. Take with a full glass of water on an empty stomach.   atorvastatin 10 MG tablet Commonly known as:  LIPITOR Take 10 mg by mouth daily.   clopidogrel 75 MG tablet Commonly known as:  PLAVIX TAKE 1 TABLET ONCE DAILY.   Coenzyme Q10 10 MG capsule Take 10 mg by mouth at bedtime.   fluticasone 50 MCG/ACT nasal spray Commonly known as:  FLONASE Place 2 sprays into both nostrils daily as needed for allergies.   Glucosamine 1500 Complex Caps Take 2 capsules by mouth daily after breakfast.   isosorbide mononitrate 60 MG 24 hr tablet Commonly known as:  IMDUR Take 2 tablets (120 mg total) by mouth daily.   levocetirizine 5 MG tablet Commonly known as:  XYZAL Take 1 tablet (5 mg total) by mouth every evening.   levothyroxine 75 MCG tablet Commonly known  as:  SYNTHROID TAKE 1 TABLET ONCE DAILY ON EMPTY STOMACH. What changed:  See the new instructions.   metoprolol succinate 100 MG 24 hr tablet Commonly known as:  TOPROL-XL TAKE 1 TABLET DAILY WITH OR IMMEDIATELY FOLLOWING A MEAL. What changed:  See the new instructions.   mirabegron ER 50 MG Tb24 tablet Commonly known as:  Myrbetriq Take 1 tablet (50 mg total) by mouth daily.   nitroGLYCERIN 0.4 MG SL tablet Commonly known as:  NITROSTAT Place 0.4 mg under the tongue every 5 (five) minutes as needed for chest pain. X 3 doses   predniSONE 5 MG tablet Commonly known as:  DELTASONE Take 5-7.5 mg by mouth See admin instructions. Take 5 mg on Sun and Wednesday.Take 7.5 mg all other days, take With food or milk.   ranolazine 1000 MG SR tablet Commonly known as:  RANEXA TAKE 1 TABLET BY MOUTH TWICE DAILY.   venlafaxine XR 75 MG 24 hr capsule Commonly known as:  EFFEXOR-XR TAKE 3 CAPSULES ONCE A DAY. What changed:  See the new instructions.   VITAMIN D PO Take 2,000 Units by mouth daily.      Follow-up Information    Mast, Man X, NP. Schedule an appointment as soon as possible for a visit in 1 week(s).   Specialty:  Internal Medicine Contact information: [address] [address] [phone]        Jettie Booze, MD .   Specialties:  Cardiology, Radiology, Interventional Cardiology Contact information: [address] [place] [address] [phone]          Allergies  Allergen Reactions  . Nsaids Other (See Comments)    Due to chronic kidney failure  . Butorphanol Other (See Comments)    agitation Constipation Produced a lot of urine, made patient feel crazy  . Sulfa Antibiotics Rash  . Bisoprolol Fumarate Other (See Comments)    Doesn't remember  . Butorphanol Tartrate Other (See Comments)    Produced a lot of urine, made patient feel crazy  . Codeine Nausea And Vomiting  . Demerol [Meperidine] Nausea And  Vomiting  . Imipramine     Sweating, facial dysfunction   . Meperidine And Related Nausea And Vomiting  . Statins     MYALGIAS  . Tequin [Gatifloxacin] Other (See Comments)    Caused hypoglycemia Low blood sugar  . Brilinta [Ticagrelor] Rash    CAUSES PETECHIAE, PURPURA  . Pseudoephedrine Hcl Palpitations  . Septra [Sulfamethoxazole-Trimethoprim] Rash  . Sulfamethoxazole-Trimethoprim Rash    Consultations:  None    Procedures/Studies: Dg Chest 2 View  Result Date: 07/17/2018 CLINICAL DATA:  Initial evaluation for acute syncope. EXAM: CHEST - 2 VIEW COMPARISON:  Prior radiograph from 01/11/2018 FINDINGS: Median sternotomy wires underlying CABG markers and surgical clips noted. Stable cardiomegaly. Mediastinal silhouette normal. Aortic atherosclerosis. Lungs mildly hypoinflated. Mild diffuse pulmonary vascular congestion without overt pulmonary edema. No definite pleural effusion. No focal infiltrates. No pneumothorax. No acute osseous finding. IMPRESSION: 1. Cardiomegaly with mild diffuse pulmonary vascular congestion without overt pulmonary edema. 2. Aortic atherosclerosis. Electronically Signed   By: Jeannine Boga M.D.   On: 07/17/2018 21:43   Ct Head Wo Contrast  Result Date: 07/17/2018 CLINICAL DATA:  78 year old female with involuntary movement of arms. EXAM: CT HEAD WITHOUT CONTRAST TECHNIQUE: Contiguous axial images were obtained from the base of the skull through the vertex without intravenous contrast. COMPARISON:  Head and cervical spine CT 04/04/2017 and earlier. Brain MRI 01/07/2016. FINDINGS: Brain: Stable cerebral volume since 2019. Chronic white matter hypodensity and heterogeneity in the deep gray matter nuclei. Stable gray-white matter differentiation throughout the brain. No midline shift, ventriculomegaly, mass effect, evidence of mass lesion, intracranial hemorrhage or evidence of cortically based acute infarction. No cortical encephalomalacia identified.  Vascular: Calcified atherosclerosis at the skull base. No suspicious intracranial vascular hyperdensity. Skull: No acute osseous abnormality identified. Sinuses/Orbits: Visualized paranasal sinuses and mastoids are stable and well pneumatized. Other: No acute orbit or scalp soft tissue findings. IMPRESSION: Stable non contrast CT appearance of the brain since 2019 with chronic small vessel disease. Electronically Signed   By: Genevie Ann M.D.   On: 07/17/2018 23:17    Echocardiogram  IMPRESSIONS    1. The left ventricle has normal systolic function, with an ejection fraction of 55-60%. The cavity size was normal. Left ventricular diastolic  Doppler parameters are consistent with impaired relaxation. No evidence of left ventricular regional wall  motion abnormalities.  2. The right ventricle has normal systolic function. The cavity was normal. There is no increase in right ventricular wall thickness.  3. Trivial pericardial effusion is present.  4. There is mild mitral annular calcification present. No evidence of mitral valve stenosis. No significant mitral regurgitation.  5. The aortic valve is tricuspid. Mild calcification of the aortic valve. No stenosis of the aortic valve.  6. The aortic root and ascending aorta are normal in size and structure.  7. The IVC is normal. No complete TR doppler jet so unable to estimate PA systolic pressure.     Discharge Exam: Vitals:   07/18/18 2154 07/19/18 0347  BP: (!) 131/57 (!) 119/51  Pulse: 73 65  Resp:  18  Temp:  97.9 F (36.6 C)  SpO2: 98% 96%    General: Pt is alert, awake, not in acute distress Cardiovascular: RRR, S1/S2 +, no rubs, no gallops Respiratory: CTA bilaterally, no wheezing, no rhonchi Abdominal: Soft, NT, ND, bowel sounds + Extremities: no edema, no cyanosis    The results of significant diagnostics from this hospitalization (including imaging, microbiology, ancillary and laboratory) are listed below for reference.      Microbiology: Recent Results (from the past 240 hour(s))  Urine culture     Status: Abnormal   Collection Time: 07/17/18 10:04 PM  Result Value Ref Range Status   Specimen Description   Final    URINE, RANDOM Performed at [place] [address], [place], [place] [address]    Special Requests   Final    NONE Performed at [facility], [place] [address], [place], [place] [address]    Culture MULTIPLE SPECIES PRESENT, SUGGEST RECOLLECTION (A)  Final   Report Status 07/18/2018 FINAL  Final  MRSA PCR Screening     Status: None   Collection Time: 07/18/18  3:19 AM  Result Value Ref Range Status   MRSA by PCR NEGATIVE NEGATIVE Final    Comment:        The GeneXpert MRSA Assay (FDA approved for NASAL specimens only), is one component of a comprehensive MRSA colonization surveillance program. It is not intended to diagnose MRSA infection nor to guide or monitor treatment for MRSA infections. Performed at [facility], [place] [address], [place], [place] [address]      Labs: BNP (last 3 results) No results for input(s): BNP in the last 8760 hours. Basic Metabolic Panel: Recent Labs  Lab 07/17/18 2157 07/18/18 0426 07/19/18 0342  NA 139 139 139  K 4.4 3.8 3.4*  CL 108 108 107  CO2 24 23 24   GLUCOSE 133* 100* 91  BUN 31* 29* 21  CREATININE 1.34* 1.12* 1.11*  CALCIUM 8.3* 8.5* 8.3*   Liver Function Tests: Recent Labs  Lab 07/17/18 2157  AST 14*  ALT 10  ALKPHOS 41  BILITOT 0.3  PROT 6.0*  ALBUMIN 3.4*   No results for input(s): LIPASE, AMYLASE in the last 168 hours. No results for input(s): AMMONIA in the last 168 hours. CBC: Recent Labs  Lab 07/17/18 2157 07/18/18 0426 07/19/18 0342  WBC 14.4* 12.1* 10.8*  NEUTROABS 12.7*  --   --   HGB 10.6* 10.2* 10.6*  HCT 33.9* 32.7* 33.6*  MCV 99.1 98.2 98.5  PLT 230 224 224   Cardiac Enzymes: Recent Labs  Lab 07/17/18 2157  TROPONINI <0.03    BNP:  Invalid input(s): POCBNP CBG: No results for input(s): GLUCAP in the last 168 hours. D-Dimer No results for input(s): DDIMER in the last 72 hours. Hgb A1c No results for input(s): HGBA1C in the last 72 hours. Lipid Profile No results for input(s): CHOL, HDL, LDLCALC, TRIG, CHOLHDL, LDLDIRECT in the last 72 hours. Thyroid function studies Recent Labs    07/18/18 0426  TSH 0.775   Anemia work up No results for input(s): VITAMINB12, FOLATE, FERRITIN, TIBC, IRON, RETICCTPCT in the last 72 hours. Urinalysis    Component Value Date/Time   COLORURINE YELLOW 07/17/2018 2204   APPEARANCEUR CLEAR 07/17/2018 2204   LABSPEC 1.009 07/17/2018 2204   PHURINE 5.0 07/17/2018 2204   GLUCOSEU NEGATIVE 07/17/2018 2204   HGBUR NEGATIVE 07/17/2018 [place] 07/17/2018 2204   KETONESUR NEGATIVE 07/17/2018 2204   PROTEINUR NEGATIVE 07/17/2018 2204   NITRITE NEGATIVE 07/17/2018 2204   LEUKOCYTESUR NEGATIVE 07/17/2018 2204   Sepsis Labs Invalid input(s): PROCALCITONIN,  WBC,  LACTICIDVEN Microbiology Recent Results (from the past 240 hour(s))  Urine culture     Status: Abnormal   Collection Time: 07/17/18 10:04 PM  Result Value Ref Range Status   Specimen Description   Final    URINE, RANDOM Performed at [facility], [place] [address], [place], [place] [address]    Special Requests   Final    NONE Performed at [facility], [place] [address], [place], [place] [address]    Culture MULTIPLE SPECIES PRESENT, SUGGEST RECOLLECTION (A)  Final   Report Status 07/18/2018 FINAL  Final  MRSA PCR Screening     Status: None   Collection Time: 07/18/18  3:19 AM  Result Value Ref Range Status   MRSA by PCR NEGATIVE NEGATIVE Final    Comment:        The GeneXpert MRSA Assay (FDA approved for NASAL specimens only), is one component of a comprehensive MRSA colonization surveillance program. It is not intended to diagnose MRSA infection nor  to guide or monitor treatment for MRSA infections. Performed at [facility], [place] [address] [place], [place] [address]      Patient was seen and examined on the day of discharge and was found to be in stable condition. Time coordinating discharge: 25 minutes including assessment and coordination of care, as well as examination of the patient.   SIGNED:  Dessa Phi, DO [facility] [email or web address] 07/19/2018, 11:17 AM

## 2018-07-20 ENCOUNTER — Encounter: Payer: Self-pay | Admitting: Nurse Practitioner

## 2018-07-20 ENCOUNTER — Non-Acute Institutional Stay (SKILLED_NURSING_FACILITY): Payer: Medicare Other | Admitting: Nurse Practitioner

## 2018-07-20 DIAGNOSIS — I5030 Unspecified diastolic (congestive) heart failure: Secondary | ICD-10-CM

## 2018-07-20 DIAGNOSIS — G259 Extrapyramidal and movement disorder, unspecified: Secondary | ICD-10-CM

## 2018-07-20 DIAGNOSIS — E039 Hypothyroidism, unspecified: Secondary | ICD-10-CM

## 2018-07-20 DIAGNOSIS — I10 Essential (primary) hypertension: Secondary | ICD-10-CM

## 2018-07-20 DIAGNOSIS — N318 Other neuromuscular dysfunction of bladder: Secondary | ICD-10-CM

## 2018-07-20 DIAGNOSIS — M353 Polymyalgia rheumatica: Secondary | ICD-10-CM | POA: Diagnosis not present

## 2018-07-20 DIAGNOSIS — M810 Age-related osteoporosis without current pathological fracture: Secondary | ICD-10-CM | POA: Diagnosis not present

## 2018-07-20 DIAGNOSIS — F339 Major depressive disorder, recurrent, unspecified: Secondary | ICD-10-CM

## 2018-07-20 DIAGNOSIS — B379 Candidiasis, unspecified: Secondary | ICD-10-CM

## 2018-07-20 DIAGNOSIS — R259 Unspecified abnormal involuntary movements: Secondary | ICD-10-CM

## 2018-07-20 DIAGNOSIS — I2510 Atherosclerotic heart disease of native coronary artery without angina pectoris: Secondary | ICD-10-CM

## 2018-07-20 DIAGNOSIS — J309 Allergic rhinitis, unspecified: Secondary | ICD-10-CM

## 2018-07-20 NOTE — Progress Notes (Signed)
Location:  [facility] Room Number: 387 Place of Service:  SNF (31) Provider:  Marlana Latus  NP  Luceal Hollibaugh X, NP  Patient Care Team: Arena Lindahl X, NP as PCP - General (Internal Medicine) Jettie Booze, MD as PCP - Cardiology (Cardiology) Wilford Corner, MD as Consulting Physician (Gastroenterology) Marlou Sa, Tonna Corner, MD as Consulting Physician (Orthopedic Surgery) Marcial Pacas, MD as Consulting Physician (Neurology) Franchot Gallo, MD as Consulting Physician (Urology) Kary Kos, MD as Consulting Physician (Neurosurgery)  Extended Emergency Contact Information Primary Emergency Contact: Merlinda Frederick Address: [address]          [place] [address] [place] Phone: [phone] Mobile Phone: [phone] Relation: Spouse Secondary Emergency Contact: [place] Mobile Phone: [phone] Relation: Son  Code Status:  DNR Goals of care: Advanced Directive information Advanced Directives 07/17/2018  Does Patient Have a Medical Advance Directive? Yes  Type of Advance Directive Out of facility DNR (pink MOST or yellow form);Living will  Does patient want to make changes to medical advance directive? No - Guardian declined  Copy of [place] in Chart? No - copy requested  Would patient like information on creating a medical advance directive? -  Pre-existing out of facility DNR order (yellow form or pink MOST form) Yellow form placed in chart (order not valid for inpatient use);Pink MOST form placed in chart (order not valid for inpatient use)     Chief Complaint  Patient presents with  . Hospitalization Follow-up    HPI:  Pt is a 78 y.o. female seen today for f/u hospitalization 07/17/18-07/19/18 for syncope. CT head 07/17/18 showed small vessel disease. CXR 07/17/18 mild pulmonary vasculature, no overt pulmonary edema. Unremarkable EKG 07/17/18. EF 55-60% 07/18/18 echocardiogram. Will f/u  cardiology in outpatient setting. She is off Amlodipine and Furosemide during the hospital stay.   Hx of obesity, obstructive sleep apnea, chronic diastolic CHF-compensated, EF 55%, CAD, on Plavix 75mg  qd, Lipitor 10mg  qd.  Isosorbide 120mg  qd, Ranolazine 1000mg  bid RMR on steroids/Prednisone 7.5mg  2x/week, 5mg  qd all other days , hypothyroidism, on Levothyroxine 66mcg po qd, TSH 0.775 07/18/18  involuntary limb movement, saw neurology. Her mood is stable on Venlafaxine 225mg  qd. OAB, stable on Mirabegron 50mg  qd. HTN, blood pressure is controlled on Metoprolol 100mg  qd. Allergic rhinitis, stable on Xyzal 5mg  qd, Flonase daily prn. Osteoporosis, on Alendronate 70mg  wkly, Vit D 2000u qd.   Past Medical History:  Diagnosis Date  . Anemia   . Arthritis    "fingers, back, shoulders, hips" (04/10/2016)  . Chronic diastolic CHF (congestive heart failure) (HCC)    a. normal EF, LVEDP at [facility] in 6/17 33 >> Lasix started   . CKD (chronic kidney disease), stage III ([place])   . Coronary artery disease    a. s/p CABG 2000 (SVG/ free LIMA Y graft to the diagonal and distal LAD, SVG to the OM 1, and SVG to the PDA) . b. Cath 12/19/2014 90% dSVG to RCA s/p 2 overlapping DES. c. LHC 2016, 2017 no intervention except diuresis needed. d. Low risk nuc 03/2016. e. Cath 12/2017 patent LIMA-LAD, SVG-OM, SVG-PDA but occluded SVG to diag.   . Depression    with anxious component  . GERD (gastroesophageal reflux disease)   . History of hiatal hernia   . Hyperlipidemia   . Hypothyroidism   . Obesity   . OSA on CPAP   . PMR (polymyalgia rheumatica) (HCC)   . Pneumonia    "  2-3 times" (04/10/2016)  . Stable angina (HCC)    microvascular, improved with Ranexa  . Subclavian artery stenosis (HCC)    a. L by cath note in 2016.  Marland Kitchen TIA (transient ischemic attack)    Past Surgical History:  Procedure Laterality Date  . BREAST BIOPSY Right 1964  . CARDIAC CATHETERIZATION  08   patent grafts, no culprit lesions, EF 65%  .  CARDIAC CATHETERIZATION N/A 12/19/2014   Procedure: Left Heart Cath and Cors/Grafts Angiography;  Surgeon: Jettie Booze, MD;  Location: [place] CV LAB;  Service: Cardiovascular;  Laterality: N/A;  . CARDIAC CATHETERIZATION N/A 12/19/2014   Procedure: Coronary Stent Intervention;  Surgeon: Jettie Booze, MD;  Location: [place] [facility];  Service: Cardiovascular;  Laterality: N/A;  . CARDIAC CATHETERIZATION N/A 01/01/2015   Procedure: Left Heart Cath and Cors/Grafts Angiography;  Surgeon: Jettie Booze, MD;  Location: [place] [facility];  Service: Cardiovascular;  Laterality: N/A;  . CARDIAC CATHETERIZATION N/A 09/20/2015   Procedure: Left Heart Cath and Cors/Grafts Angiography;  Surgeon: Jettie Booze, MD;  Location: [place] [facility];  Service: Cardiovascular;  Laterality: N/A;  . CATARACT EXTRACTION W/ INTRAOCULAR LENS  IMPLANT, BILATERAL Bilateral 2011  . COLONOSCOPY WITH PROPOFOL N/A 07/27/2016   Procedure: COLONOSCOPY WITH PROPOFOL;  Surgeon: Wilford Corner, MD;  Location: [place];  Service: Endoscopy;  Laterality: N/A;  . CORONARY ARTERY BYPASS GRAFT  2000   ASCVD, multivessel, S./P.  . DILATION AND CURETTAGE OF UTERUS  1980s  . ESOPHAGOGASTRODUODENOSCOPY (EGD) WITH PROPOFOL N/A 07/27/2016   Procedure: ESOPHAGOGASTRODUODENOSCOPY (EGD) WITH PROPOFOL;  Surgeon: Wilford Corner, MD;  Location: [place];  Service: Endoscopy;  Laterality: N/A;  . FRACTURE SURGERY    . LEFT HEART CATH AND CORS/GRAFTS ANGIOGRAPHY N/A 01/12/2018   Procedure: LEFT HEART CATH AND CORS/GRAFTS ANGIOGRAPHY;  Surgeon: Jettie Booze, MD;  Location: [place] [facility];  Service: Cardiovascular;  Laterality: N/A;  . PATELLA FRACTURE SURGERY Left 1993    Allergies  Allergen Reactions  . Nsaids Other (See Comments)    Due to chronic kidney failure  . Butorphanol Other (See Comments)    agitation Constipation Produced a lot of urine, made patient feel crazy  . Sulfa  Antibiotics Rash  . Bisoprolol Fumarate Other (See Comments)    Doesn't remember  . Butorphanol Tartrate Other (See Comments)    Produced a lot of urine, made patient feel crazy  . Codeine Nausea And Vomiting  . Demerol [Meperidine] Nausea And Vomiting  . Imipramine     Sweating, facial dysfunction   . Meperidine And Related Nausea And Vomiting  . Statins     MYALGIAS  . Tequin [Gatifloxacin] Other (See Comments)    Caused hypoglycemia Low blood sugar  . Brilinta [Ticagrelor] Rash    CAUSES PETECHIAE, PURPURA  . Pseudoephedrine Hcl Palpitations  . Septra [Sulfamethoxazole-Trimethoprim] Rash  . Sulfamethoxazole-Trimethoprim Rash    Outpatient Encounter Medications as of 07/20/2018  Medication Sig  . alendronate (FOSAMAX) 70 MG tablet Take 70 mg by mouth once a week. Take with a full glass of water on an empty stomach.  Marland Kitchen atorvastatin (LIPITOR) 10 MG tablet Take 10 mg by mouth daily.  . Cholecalciferol (VITAMIN D PO) Take 2,000 Units by mouth daily.   . clopidogrel (PLAVIX) 75 MG tablet TAKE 1 TABLET ONCE DAILY. (Patient taking differently: Take 75 mg by mouth daily. )  . Coenzyme Q10 10 MG capsule Take 10 mg by mouth at bedtime.   Marland Kitchen  fluticasone (FLONASE) 50 MCG/ACT nasal spray Place 2 sprays into both nostrils daily as needed for allergies.   . Glucosamine-Chondroit-Vit C-Mn (GLUCOSAMINE 1500 COMPLEX) CAPS Take 2 capsules by mouth daily after breakfast.  . isosorbide mononitrate (IMDUR) 60 MG 24 hr tablet Take 2 tablets (120 mg total) by mouth daily.  Marland Kitchen levocetirizine (XYZAL) 5 MG tablet Take 1 tablet (5 mg total) by mouth every evening.  Marland Kitchen levothyroxine (SYNTHROID, LEVOTHROID) 75 MCG tablet TAKE 1 TABLET ONCE DAILY ON EMPTY STOMACH. (Patient taking differently: Take 75 mcg by mouth daily before breakfast. )  . metoprolol succinate (TOPROL-XL) 100 MG 24 hr tablet TAKE 1 TABLET DAILY WITH OR IMMEDIATELY FOLLOWING A MEAL. (Patient taking differently: Take 100 mg by mouth daily. )  .  mirabegron ER (MYRBETRIQ) 50 MG TB24 tablet Take 1 tablet (50 mg total) by mouth daily.  . nitroGLYCERIN (NITROSTAT) 0.4 MG SL tablet Place 0.4 mg under the tongue every 5 (five) minutes as needed for chest pain. X 3 doses  . predniSONE (DELTASONE) 5 MG tablet Take 5-7.5 mg by mouth See admin instructions. Take 5 mg on Sun and Wednesday.Take 7.5 mg all other days, take With food or milk.  . ranolazine (RANEXA) 1000 MG SR tablet TAKE 1 TABLET BY MOUTH TWICE DAILY. (Patient taking differently: Take 1,000 mg by mouth 2 (two) times daily. )  . venlafaxine XR (EFFEXOR-XR) 75 MG 24 hr capsule TAKE 3 CAPSULES ONCE A DAY. (Patient taking differently: Take 225 mg by mouth daily with breakfast. )   No facility-administered encounter medications on file as of 07/20/2018.     Review of Systems  Constitutional: Negative for activity change, appetite change, chills, diaphoresis, fatigue, fever and unexpected weight change.  HENT: Positive for hearing loss. Negative for congestion, rhinorrhea, sinus pressure, sinus pain, sore throat and voice change.   Eyes: Negative for visual disturbance.  Respiratory: Positive for shortness of breath. Negative for cough and wheezing.        DOE  Cardiovascular: Positive for leg swelling. Negative for chest pain and palpitations.  Gastrointestinal: Negative for abdominal distention, abdominal pain, constipation, diarrhea, nausea and vomiting.  Genitourinary: Negative for difficulty urinating, dysuria and urgency.  Musculoskeletal: Positive for back pain and gait problem.  Skin: Positive for rash. Negative for color change and pallor.       Redness in lower abd/groin skin folds.   Neurological: Negative for dizziness, speech difficulty, weakness and headaches.       Memory lapses.   Psychiatric/Behavioral: Negative for agitation, behavioral problems, hallucinations and sleep disturbance. The patient is not nervous/anxious.     Immunization History  Administered Date(s)  Administered  . HPV Bivalent 11/24/2013, 01/17/2016  . Influenza Whole 12/23/2017  . Influenza-Unspecified 01/10/2013, 12/27/2014, 12/30/2016  . Pneumococcal Conjugate-13 04/21/2013  . Pneumococcal Polysaccharide-23 02/27/1993, 03/23/2009  . Tdap 04/20/2012  . Zoster 11/10/2010   Pertinent  Health Maintenance Due  Topic Date Due  . INFLUENZA VACCINE  10/22/2018  . DEXA SCAN  Completed  . PNA vac Low Risk Adult  Completed   Fall Risk  01/27/2018 12/07/2017  Falls in the past year? 1 Yes  Number falls in past yr: 1 2 or more  Injury with Fall? 0 No   Functional Status Survey:    Vitals:   07/20/18 1012  BP: (!) 130/58  Pulse: 68  Resp: 18  Temp: 98 F (36.7 C)  SpO2: 95%  Weight: 197 lb (89.4 kg)  Height: 4\' 10"  (1.473 m)   Body  mass index is 41.17 kg/m. Physical Exam Vitals signs reviewed.  Constitutional:      General: She is not in acute distress.    Appearance: Normal appearance. She is obese. She is not ill-appearing or diaphoretic.  HENT:     Head: Normocephalic and atraumatic.     Nose: Nose normal.     Mouth/Throat:     Mouth: Mucous membranes are moist.  Eyes:     Extraocular Movements: Extraocular movements intact.     Pupils: Pupils are equal, round, and reactive to light.  Neck:     Musculoskeletal: Normal range of motion and neck supple.  Cardiovascular:     Rate and Rhythm: Normal rate and regular rhythm.     Heart sounds: No murmur.  Pulmonary:     Effort: Pulmonary effort is normal.     Breath sounds: No wheezing, rhonchi or rales.  Abdominal:     Palpations: Abdomen is soft.     Tenderness: There is no abdominal tenderness. There is no guarding or rebound.  Musculoskeletal:     Right lower leg: Edema present.     Left lower leg: Edema present.     Comments: Trace edema BLE. Ambulates with walker.   Skin:    General: Skin is warm and dry.     Findings: Erythema and rash present.     Comments: Redness in lower abd/groin skin folds.    Neurological:     General: No focal deficit present.     Mental Status: She is alert. Mental status is at baseline.     Cranial Nerves: No cranial nerve deficit.     Motor: No weakness.     Coordination: Coordination normal.     Gait: Gait abnormal.     Comments: Oriented to person and place.   Psychiatric:        Mood and Affect: Mood normal.        Behavior: Behavior normal.     Labs reviewed: Recent Labs    07/17/18 2157 07/18/18 0426 07/19/18 0342  NA 139 139 139  K 4.4 3.8 3.4*  CL 108 108 107  CO2 24 23 24   GLUCOSE 133* 100* 91  BUN 31* 29* 21  CREATININE 1.34* 1.12* 1.11*  CALCIUM 8.3* 8.5* 8.3*   Recent Labs    06/07/18 0710 07/17/18 2157  AST 10 14*  ALT 7 10  ALKPHOS  --  41  BILITOT 0.3 0.3  PROT 6.0* 6.0*  ALBUMIN  --  3.4*   Recent Labs    01/24/18 1042 07/17/18 2157 07/18/18 0426 07/19/18 0342  WBC 14.4* 14.4* 12.1* 10.8*  NEUTROABS 10,253* 12.7*  --   --   HGB 11.2* 10.6* 10.2* 10.6*  HCT 34.3* 33.9* 32.7* 33.6*  MCV 91.0 99.1 98.2 98.5  PLT 303 230 224 224   Lab Results  Component Value Date   TSH 0.775 07/18/2018   Lab Results  Component Value Date   HGBA1C 5.6 01/24/2018   Lab Results  Component Value Date   CHOL 159 06/07/2018   HDL 76 06/07/2018   LDLCALC 65 06/07/2018   TRIG 96 06/07/2018   CHOLHDL 2.1 06/07/2018    Significant Diagnostic Results in last 30 days:  Dg Chest 2 View  Result Date: 07/17/2018 CLINICAL DATA:  Initial evaluation for acute syncope. EXAM: CHEST - 2 VIEW COMPARISON:  Prior radiograph from 01/11/2018 FINDINGS: Median sternotomy wires underlying CABG markers and surgical clips noted. Stable cardiomegaly. Mediastinal silhouette normal. Aortic atherosclerosis.  Lungs mildly hypoinflated. Mild diffuse pulmonary vascular congestion without overt pulmonary edema. No definite pleural effusion. No focal infiltrates. No pneumothorax. No acute osseous finding. IMPRESSION: 1. Cardiomegaly with mild diffuse  pulmonary vascular congestion without overt pulmonary edema. 2. Aortic atherosclerosis. Electronically Signed   By: Jeannine Boga M.D.   On: 07/17/2018 21:43   Ct Head Wo Contrast  Result Date: 07/17/2018 CLINICAL DATA:  78 year old female with involuntary movement of arms. EXAM: CT HEAD WITHOUT CONTRAST TECHNIQUE: Contiguous axial images were obtained from the base of the skull through the vertex without intravenous contrast. COMPARISON:  Head and cervical spine CT 04/04/2017 and earlier. Brain MRI 01/07/2016. FINDINGS: Brain: Stable cerebral volume since 2019. Chronic white matter hypodensity and heterogeneity in the deep gray matter nuclei. Stable gray-white matter differentiation throughout the brain. No midline shift, ventriculomegaly, mass effect, evidence of mass lesion, intracranial hemorrhage or evidence of cortically based acute infarction. No cortical encephalomalacia identified. Vascular: Calcified atherosclerosis at the skull base. No suspicious intracranial vascular hyperdensity. Skull: No acute osseous abnormality identified. Sinuses/Orbits: Visualized paranasal sinuses and mastoids are stable and well pneumatized. Other: No acute orbit or scalp soft tissue findings. IMPRESSION: Stable non contrast CT appearance of the brain since 2019 with chronic small vessel disease. Electronically Signed   By: Genevie Ann M.D.   On: 07/17/2018 23:17    Assessment/Plan Hypocalcemia Will add Ca 600mg  po bid, continue Vit D  Osteoporosis Continue Alendronate 70mg  q week, Vit D 2000 u qd, adding Ca 600mg  bid.   Hypertonicity of bladder No urinary retention, continue Mirabegron 50mg  qd.   Polymyalgia rheumatica (HCC) Stable, continue Prednisone 7.5mg  x2/week, 5mg  qd all other days.   Depression, recurrent ([place]) Her mood is stable, continue Venlafaxine 225mg  qd.   (HFpEF) heart failure with preserved ejection fraction ([place]) Compensated clinically, EF 55-60%.   CAD (coronary artery disease)  Continue Plavix 75mg  qd, Lipitor 10mg  qd.  Isosorbide 120mg  qd, Ranolazine 1000mg  bid, f/u cardiology.   Essential hypertension Controlled blood pressure, continue Metoprolol 100mg  qd.   Allergic rhinitis Stable, continue Xyzal 5mg  qd, Flonase daily prn.  Hypothyroidism Continue Levothyroxine 63mcg po qd, TSH 0.775 07/18/18    Abnormal movement Not new, involuntary limb movement, saw neurology, not disabling.   Candidiasis Apply Nystatin powder bid to lower abd/groin skin folds x 4 weeks, may use OTC anti fungal cream prn previously used at home as requested     Family/ staff Communication: plan of care reviewed with the patient and charge nurse.   Labs/tests ordered:  none  Time spend 25 minutes.

## 2018-07-20 NOTE — Assessment & Plan Note (Signed)
Her mood is stable, continue Venlafaxine 225mg  qd.

## 2018-07-20 NOTE — Assessment & Plan Note (Signed)
Continue Plavix 75mg  qd, Lipitor 10mg  qd.  Isosorbide 120mg  qd, Ranolazine 1000mg  bid, f/u cardiology.

## 2018-07-20 NOTE — Assessment & Plan Note (Signed)
Continue Levothyroxine 33mcg po qd, TSH 0.775 07/18/18

## 2018-07-20 NOTE — Assessment & Plan Note (Signed)
No urinary retention, continue Mirabegron 50mg  qd.

## 2018-07-20 NOTE — Assessment & Plan Note (Signed)
Continue Alendronate 70mg  q week, Vit D 2000 u qd, adding Ca 600mg  bid.

## 2018-07-20 NOTE — Assessment & Plan Note (Signed)
Will add Ca 600mg  po bid, continue Vit D

## 2018-07-20 NOTE — Assessment & Plan Note (Signed)
Apply Nystatin powder bid to lower abd/groin skin folds x 4 weeks, may use OTC anti fungal cream prn previously used at home as requested

## 2018-07-20 NOTE — Assessment & Plan Note (Signed)
Compensated clinically, EF 55-60%.

## 2018-07-20 NOTE — Assessment & Plan Note (Signed)
Stable, continue Prednisone 7.5mg  x2/week, 5mg  qd all other days.

## 2018-07-20 NOTE — Assessment & Plan Note (Signed)
Not new, involuntary limb movement, saw neurology, not disabling.

## 2018-07-20 NOTE — Assessment & Plan Note (Signed)
Controlled blood pressure, continue Metoprolol 100mg  qd.

## 2018-07-20 NOTE — Assessment & Plan Note (Signed)
Stable, continue Xyzal 5mg  qd, Flonase daily prn.

## 2018-07-22 ENCOUNTER — Encounter: Payer: Self-pay | Admitting: Internal Medicine

## 2018-07-22 ENCOUNTER — Non-Acute Institutional Stay (SKILLED_NURSING_FACILITY): Payer: Medicare Other | Admitting: Internal Medicine

## 2018-07-22 DIAGNOSIS — D649 Anemia, unspecified: Secondary | ICD-10-CM

## 2018-07-22 DIAGNOSIS — R55 Syncope and collapse: Secondary | ICD-10-CM

## 2018-07-22 DIAGNOSIS — I5030 Unspecified diastolic (congestive) heart failure: Secondary | ICD-10-CM

## 2018-07-22 DIAGNOSIS — R600 Localized edema: Secondary | ICD-10-CM

## 2018-07-22 DIAGNOSIS — F339 Major depressive disorder, recurrent, unspecified: Secondary | ICD-10-CM

## 2018-07-22 DIAGNOSIS — M810 Age-related osteoporosis without current pathological fracture: Secondary | ICD-10-CM

## 2018-07-22 DIAGNOSIS — I2510 Atherosclerotic heart disease of native coronary artery without angina pectoris: Secondary | ICD-10-CM

## 2018-07-22 DIAGNOSIS — N183 Chronic kidney disease, stage 3 unspecified: Secondary | ICD-10-CM

## 2018-07-22 DIAGNOSIS — G4733 Obstructive sleep apnea (adult) (pediatric): Secondary | ICD-10-CM

## 2018-07-22 DIAGNOSIS — E039 Hypothyroidism, unspecified: Secondary | ICD-10-CM

## 2018-07-22 DIAGNOSIS — I10 Essential (primary) hypertension: Secondary | ICD-10-CM

## 2018-07-22 NOTE — Progress Notes (Signed)
Location:  [facility] Room Number: 557 Place of Service:  SNF (31) Provider:Carolyn Maniscalco L.MD   Mast, Man X, NP  Patient Care Team: Mast, Man X, NP as PCP - General (Internal Medicine) Jettie Booze, MD as PCP - Cardiology (Cardiology) Wilford Corner, MD as Consulting Physician (Gastroenterology) Marlou Sa, Tonna Corner, MD as Consulting Physician (Orthopedic Surgery) Marcial Pacas, MD as Consulting Physician (Neurology) Franchot Gallo, MD as Consulting Physician (Urology) Kary Kos, MD as Consulting Physician (Neurosurgery)  Extended Emergency Contact Information Primary Emergency Contact: Merlinda Frederick Address: [address]          [place] [address] [place] Phone: [phone] Mobile Phone: [phone] Relation: Spouse Secondary Emergency Contact: [place] Mobile Phone: [phone] Relation: Son  Code Status:DNR Goals of care: Advanced Directive information Advanced Directives 07/17/2018  Does Patient Have a Medical Advance Directive? Yes  Type of Advance Directive Out of facility DNR (pink MOST or yellow form);Living will  Does patient want to make changes to medical advance directive? No - Guardian declined  Copy of [place] in Chart? No - copy requested  Would patient like information on creating a medical advance directive? -  Pre-existing out of facility DNR order (yellow form or pink MOST form) Yellow form placed in chart (order not valid for inpatient use);Pink MOST form placed in chart (order not valid for inpatient use)     Chief Complaint  Patient presents with  . New Admit To SNF    New admit to facility     HPI:  Pt is a 78 y.o. female seen today for medical management of chronic diseases.    Patient stayed in the hospital from 04/26 to 04/28 for Episode of Syncope Patient has h/o Obesity with OSA, Chronic Diastolic CHF, CAD s/p CABG and stent placement,PMR  on chronic Steroids and Hypothyroidism  Patient lives with her husband in [place] in friends home She says she was sitting in her recliner and doing puzzles when she felt like she is going to pass out. She could not call for her husband and became unresponsive. It was witnessed by her husband.  She denied any chest pain.It lasted for few min Her BP in ED was in 80s She had  a work-up for syncope.  Her echo revealed normal EF.  She is now discharged to SNF for further therapy.  She was taken off the amlodipine and her Lasix. Patient has not had any more episodes since then.  She denies having any shortness of breath cough or lower extremity swelling. She did tell me that when she gets up from the bed and sits up she feels dizzy.  But then it gets better.  She is working with therapy.  Is able to walk with mild assist and walker. She was very independent before this episode.    Past Medical History:  Diagnosis Date  . Anemia   . Arthritis    "fingers, back, shoulders, hips" (04/10/2016)  . Chronic diastolic CHF (congestive heart failure) (HCC)    a. normal EF, LVEDP at [facility] in 6/17 33 >> Lasix started   . CKD (chronic kidney disease), stage III ([place])   . Coronary artery disease    a. s/p CABG 2000 (SVG/ free LIMA Y graft to the diagonal and distal LAD, SVG to the OM 1, and SVG to the PDA) . b. Cath 12/19/2014 90% dSVG to RCA s/p 2 overlapping DES. c. LHC 2016, 2017 no  intervention except diuresis needed. d. Low risk nuc 03/2016. e. Cath 12/2017 patent LIMA-LAD, SVG-OM, SVG-PDA but occluded SVG to diag.   . Depression    with anxious component  . GERD (gastroesophageal reflux disease)   . History of hiatal hernia   . Hyperlipidemia   . Hypothyroidism   . Obesity   . OSA on CPAP   . PMR (polymyalgia rheumatica) (HCC)   . Pneumonia    "2-3 times" (04/10/2016)  . Stable angina (HCC)    microvascular, improved with Ranexa  . Subclavian artery stenosis (HCC)    a. L by cath note in 2016.  Marland Kitchen TIA  (transient ischemic attack)    Past Surgical History:  Procedure Laterality Date  . BREAST BIOPSY Right 1964  . CARDIAC CATHETERIZATION  08   patent grafts, no culprit lesions, EF 65%  . CARDIAC CATHETERIZATION N/A 12/19/2014   Procedure: Left Heart Cath and Cors/Grafts Angiography;  Surgeon: Jettie Booze, MD;  Location: [facility];  Service: Cardiovascular;  Laterality: N/A;  . CARDIAC CATHETERIZATION N/A 12/19/2014   Procedure: Coronary Stent Intervention;  Surgeon: Jettie Booze, MD;  Location: [facility];  Service: Cardiovascular;  Laterality: N/A;  . CARDIAC CATHETERIZATION N/A 01/01/2015   Procedure: Left Heart Cath and Cors/Grafts Angiography;  Surgeon: Jettie Booze, MD;  Location: [facility];  Service: Cardiovascular;  Laterality: N/A;  . CARDIAC CATHETERIZATION N/A 09/20/2015   Procedure: Left Heart Cath and Cors/Grafts Angiography;  Surgeon: Jettie Booze, MD;  Location: [facility];  Service: Cardiovascular;  Laterality: N/A;  . CATARACT EXTRACTION W/ INTRAOCULAR LENS  IMPLANT, BILATERAL Bilateral 2011  . COLONOSCOPY WITH PROPOFOL N/A 07/27/2016   Procedure: COLONOSCOPY WITH PROPOFOL;  Surgeon: Wilford Corner, MD;  Location: [facility];  Service: Endoscopy;  Laterality: N/A;  . CORONARY ARTERY BYPASS GRAFT  2000   ASCVD, multivessel, S./P.  . DILATION AND CURETTAGE OF UTERUS  1980s  . ESOPHAGOGASTRODUODENOSCOPY (EGD) WITH PROPOFOL N/A 07/27/2016   Procedure: ESOPHAGOGASTRODUODENOSCOPY (EGD) WITH PROPOFOL;  Surgeon: Wilford Corner, MD;  Location: [facility];  Service: Endoscopy;  Laterality: N/A;  . FRACTURE SURGERY    . LEFT HEART CATH AND CORS/GRAFTS ANGIOGRAPHY N/A 01/12/2018   Procedure: LEFT HEART CATH AND CORS/GRAFTS ANGIOGRAPHY;  Surgeon: Jettie Booze, MD;  Location: [facility];  Service: Cardiovascular;  Laterality: N/A;  . PATELLA FRACTURE SURGERY Left 1993    Allergies  Allergen Reactions  .  Nsaids Other (See Comments)    Due to chronic kidney failure  . Butorphanol Other (See Comments)    agitation Constipation Produced a lot of urine, made patient feel crazy  . Sulfa Antibiotics Rash  . Bisoprolol Fumarate Other (See Comments)    Doesn't remember  . Butorphanol Tartrate Other (See Comments)    Produced a lot of urine, made patient feel crazy  . Codeine Nausea And Vomiting  . Demerol [Meperidine] Nausea And Vomiting  . Imipramine     Sweating, facial dysfunction   . Meperidine And Related Nausea And Vomiting  . Statins     MYALGIAS  . Tequin [Gatifloxacin] Other (See Comments)    Caused hypoglycemia Low blood sugar  . Brilinta [Ticagrelor] Rash    CAUSES PETECHIAE, PURPURA  . Pseudoephedrine Hcl Palpitations  . Septra [Sulfamethoxazole-Trimethoprim] Rash  . Sulfamethoxazole-Trimethoprim Rash    Outpatient Encounter Medications as of 07/22/2018  Medication Sig  . alendronate (FOSAMAX) 70 MG tablet Take 70 mg by mouth once a  week. Take with a full glass of water on an empty stomach.  Marland Kitchen atorvastatin (LIPITOR) 10 MG tablet Take 10 mg by mouth daily.  . calcium carbonate (OS-CAL) 600 MG TABS tablet Take 600 mg by mouth 2 (two) times daily with a meal.  . Cholecalciferol (VITAMIN D PO) Take 2,000 Units by mouth daily.   . clopidogrel (PLAVIX) 75 MG tablet TAKE 1 TABLET ONCE DAILY.  Marland Kitchen Coenzyme Q10 10 MG capsule Take 10 mg by mouth at bedtime.   . fluticasone (FLONASE) 50 MCG/ACT nasal spray Place 2 sprays into both nostrils daily as needed for allergies.   . Glucosamine-Chondroit-Vit C-Mn (GLUCOSAMINE 1500 COMPLEX) CAPS Take 2 capsules by mouth daily after breakfast.  . isosorbide mononitrate (IMDUR) 60 MG 24 hr tablet Take 2 tablets (120 mg total) by mouth daily.  Marland Kitchen levocetirizine (XYZAL) 5 MG tablet Take 1 tablet (5 mg total) by mouth every evening.  Marland Kitchen levothyroxine (SYNTHROID, LEVOTHROID) 75 MCG tablet TAKE 1 TABLET ONCE DAILY ON EMPTY STOMACH. (Patient taking  differently: Take 75 mcg by mouth daily before breakfast. )  . metoprolol succinate (TOPROL-XL) 100 MG 24 hr tablet TAKE 1 TABLET DAILY WITH OR IMMEDIATELY FOLLOWING A MEAL. (Patient taking differently: Take 100 mg by mouth daily. )  . mirabegron ER (MYRBETRIQ) 50 MG TB24 tablet Take 1 tablet (50 mg total) by mouth daily.  . nitroGLYCERIN (NITROSTAT) 0.4 MG SL tablet Place 0.4 mg under the tongue every 5 (five) minutes as needed for chest pain. X 3 doses  . nystatin (MYCOSTATIN/NYSTOP) powder Apply topically 2 (two) times a day.  . predniSONE (DELTASONE) 5 MG tablet Take 5-7.5 mg by mouth See admin instructions. Take 5 mg on Sun and Wednesday.Take 7.5 mg all other days, take With food or milk.  . ranolazine (RANEXA) 1000 MG SR tablet TAKE 1 TABLET BY MOUTH TWICE DAILY. (Patient taking differently: Take 1,000 mg by mouth 2 (two) times daily. )  . venlafaxine XR (EFFEXOR-XR) 75 MG 24 hr capsule TAKE 3 CAPSULES ONCE A DAY. (Patient taking differently: Take 225 mg by mouth daily with breakfast. )   No facility-administered encounter medications on file as of 07/22/2018.     Review of Systems  Review of Systems  Constitutional: Negative for activity change, appetite change, chills, diaphoresis, fatigue and fever.  HENT: Negative for mouth sores, postnasal drip, rhinorrhea, sinus pain and sore throat.   Respiratory: Negative for apnea, cough, chest tightness, shortness of breath and wheezing.   Cardiovascular: Negative for chest pain, palpitations and leg swelling.  Gastrointestinal: Negative for abdominal distention, abdominal pain, constipation, diarrhea, nausea and vomiting.  Genitourinary: Negative for dysuria and frequency.  Musculoskeletal: Negative for arthralgias, joint swelling and myalgias.  Skin: Negative for rash.  Neurological: Negative for weakness Psychiatric/Behavioral: Negative for behavioral problems, confusion and sleep disturbance.     Immunization History  Administered  Date(s) Administered  . HPV Bivalent 11/24/2013, 01/17/2016  . Influenza Whole 12/23/2017  . Influenza-Unspecified 01/10/2013, 12/27/2014, 12/30/2016  . Pneumococcal Conjugate-13 04/21/2013  . Pneumococcal Polysaccharide-23 02/27/1993, 03/23/2009  . Tdap 04/20/2012  . Zoster 11/10/2010   Pertinent  Health Maintenance Due  Topic Date Due  . INFLUENZA VACCINE  10/22/2018  . DEXA SCAN  Completed  . PNA vac Low Risk Adult  Completed   Fall Risk  01/27/2018 12/07/2017  Falls in the past year? 1 Yes  Number falls in past yr: 1 2 or more  Injury with Fall? 0 No   Functional Status Survey:  Vitals:   07/22/18 1136  BP: 130/60  Pulse: 61  Resp: 20  Temp: 98.3 F (36.8 C)  SpO2: 97%  Weight: 196 lb (88.9 kg)   Body mass index is 40.96 kg/m. Physical Exam  Constitutional: Oriented to person, place, and time. Well-developed and well-nourished.  HENT:  Head: Normocephalic.  Mouth/Throat: Oropharynx is clear and moist.  Eyes: Pupils are equal, round, and reactive to light.  Neck: Neck supple.  Cardiovascular: Normal rate and normal heart sounds.  No murmur heard. Pulmonary/Chest: Effort normal and breath sounds normal. No respiratory distress. No wheezes. She has no rales.  Abdominal: Soft. Bowel sounds are normal. No distension. There is no tenderness. There is no rebound.  Musculoskeletal: No edema.  Lymphadenopathy: none Neurological: Alert and oriented to person, place, and time.  Skin: Skin is warm and dry.  Psychiatric: Normal mood and affect. Behavior is normal. Thought content normal.    Labs reviewed: Recent Labs    07/17/18 2157 07/18/18 0426 07/19/18 0342  NA 139 139 139  K 4.4 3.8 3.4*  CL 108 108 107  CO2 24 23 24   GLUCOSE 133* 100* 91  BUN 31* 29* 21  CREATININE 1.34* 1.12* 1.11*  CALCIUM 8.3* 8.5* 8.3*   Recent Labs    06/07/18 0710 07/17/18 2157  AST 10 14*  ALT 7 10  ALKPHOS  --  41  BILITOT 0.3 0.3  PROT 6.0* 6.0*  ALBUMIN  --  3.4*    Recent Labs    01/24/18 1042 07/17/18 2157 07/18/18 0426 07/19/18 0342  WBC 14.4* 14.4* 12.1* 10.8*  NEUTROABS 10,253* 12.7*  --   --   HGB 11.2* 10.6* 10.2* 10.6*  HCT 34.3* 33.9* 32.7* 33.6*  MCV 91.0 99.1 98.2 98.5  PLT 303 230 224 224   Lab Results  Component Value Date   TSH 0.775 07/18/2018   Lab Results  Component Value Date   HGBA1C 5.6 01/24/2018   Lab Results  Component Value Date   CHOL 159 06/07/2018   HDL 76 06/07/2018   LDLCALC 65 06/07/2018   TRIG 96 06/07/2018   CHOLHDL 2.1 06/07/2018    Significant Diagnostic Results in last 30 days:  Dg Chest 2 View  Result Date: 07/17/2018 CLINICAL DATA:  Initial evaluation for acute syncope. EXAM: CHEST - 2 VIEW COMPARISON:  Prior radiograph from 01/11/2018 FINDINGS: Median sternotomy wires underlying CABG markers and surgical clips noted. Stable cardiomegaly. Mediastinal silhouette normal. Aortic atherosclerosis. Lungs mildly hypoinflated. Mild diffuse pulmonary vascular congestion without overt pulmonary edema. No definite pleural effusion. No focal infiltrates. No pneumothorax. No acute osseous finding. IMPRESSION: 1. Cardiomegaly with mild diffuse pulmonary vascular congestion without overt pulmonary edema. 2. Aortic atherosclerosis. Electronically Signed   By: Jeannine Boga M.D.   On: 07/17/2018 21:43   Ct Head Wo Contrast  Result Date: 07/17/2018 CLINICAL DATA:  78 year old female with involuntary movement of arms. EXAM: CT HEAD WITHOUT CONTRAST TECHNIQUE: Contiguous axial images were obtained from the base of the skull through the vertex without intravenous contrast. COMPARISON:  Head and cervical spine CT 04/04/2017 and earlier. Brain MRI 01/07/2016. FINDINGS: Brain: Stable cerebral volume since 2019. Chronic white matter hypodensity and heterogeneity in the deep gray matter nuclei. Stable gray-white matter differentiation throughout the brain. No midline shift, ventriculomegaly, mass effect, evidence of mass  lesion, intracranial hemorrhage or evidence of cortically based acute infarction. No cortical encephalomalacia identified. Vascular: Calcified atherosclerosis at the skull base. No suspicious intracranial vascular hyperdensity. Skull: No acute osseous abnormality  identified. Sinuses/Orbits: Visualized paranasal sinuses and mastoids are stable and well pneumatized. Other: No acute orbit or scalp soft tissue findings. IMPRESSION: Stable non contrast CT appearance of the brain since 2019 with chronic small vessel disease. Electronically Signed   By: Genevie Ann M.D.   On: 07/17/2018 23:17    Assessment/Plan Syncope, unspecified syncope type Patient was c/o Some dizziness when she sits up. So we did her Orthostatic and her BP initially drops to 90/50 from 120/52. It does come back to 120/54 So we will decrease her Metoprolol to 75 mg QD for now Will check Orthostatics and BP again in few days She does have follow up with her cardiology She is off her Norvasc and Lasix for now  Essential hypertension As above Will decrease her Beta blocker for now  H/o CAD Echo in hospital was negative Asymptomatic Continue to follow On Ranexa and Imdur, Plavix and Statin Diastolic CHF Continue to follow weights Off her Lasix for now  Obstructive sleep apnea syndrome Tolerating her CPAP  Hypothyroidism,  TSH was normal Continue supplement  Osteoporosis,  On Fosamax Kidney disease, chronic, stage III  Creat stable Repeat BMP in 1 week  Depression, recurrent (HCC) Stable on Effexor  H/o PMR On chronic Steroids  Anemia  Will get Iron studies. It seems chronic Repeat CBC in 1 week Urinary Incontinence On Myrbetriq    Family/ staff Communication:   Labs/tests ordered: BMP,CBC in 1 week  Total time spent in this patient care encounter was  45_  minutes; greater than 50% of the visit spent counseling patient and staff, reviewing records , Labs and coordinating care for problems addressed at  this encounter.

## 2018-07-26 LAB — CBC AND DIFFERENTIAL
HCT: 32 — AB (ref 36–46)
Hemoglobin: 10.6 — AB (ref 12.0–16.0)
Platelets: 265 (ref 150–399)
WBC: 13.1

## 2018-07-26 LAB — HEPATIC FUNCTION PANEL
ALT: 7 (ref 7–35)
AST: 8 — AB (ref 13–35)
Alkaline Phosphatase: 45 (ref 25–125)
Bilirubin, Total: 0.3

## 2018-07-26 LAB — BASIC METABOLIC PANEL
BUN: 24 — AB (ref 4–21)
Creatinine: 1.2 — AB (ref 0.5–1.1)
Glucose: 85
Potassium: 4.7 (ref 3.4–5.3)
Sodium: 142 (ref 137–147)

## 2018-08-01 ENCOUNTER — Encounter: Payer: Self-pay | Admitting: Nurse Practitioner

## 2018-08-01 ENCOUNTER — Non-Acute Institutional Stay (SKILLED_NURSING_FACILITY): Payer: Medicare Other | Admitting: Nurse Practitioner

## 2018-08-01 DIAGNOSIS — I2510 Atherosclerotic heart disease of native coronary artery without angina pectoris: Secondary | ICD-10-CM

## 2018-08-01 DIAGNOSIS — R55 Syncope and collapse: Secondary | ICD-10-CM

## 2018-08-01 DIAGNOSIS — N318 Other neuromuscular dysfunction of bladder: Secondary | ICD-10-CM

## 2018-08-01 DIAGNOSIS — M353 Polymyalgia rheumatica: Secondary | ICD-10-CM

## 2018-08-01 DIAGNOSIS — N183 Chronic kidney disease, stage 3 unspecified: Secondary | ICD-10-CM

## 2018-08-01 DIAGNOSIS — K219 Gastro-esophageal reflux disease without esophagitis: Secondary | ICD-10-CM | POA: Diagnosis not present

## 2018-08-01 DIAGNOSIS — F339 Major depressive disorder, recurrent, unspecified: Secondary | ICD-10-CM

## 2018-08-01 DIAGNOSIS — I10 Essential (primary) hypertension: Secondary | ICD-10-CM

## 2018-08-01 DIAGNOSIS — D649 Anemia, unspecified: Secondary | ICD-10-CM

## 2018-08-01 DIAGNOSIS — E039 Hypothyroidism, unspecified: Secondary | ICD-10-CM

## 2018-08-01 DIAGNOSIS — G4733 Obstructive sleep apnea (adult) (pediatric): Secondary | ICD-10-CM

## 2018-08-01 NOTE — Assessment & Plan Note (Signed)
managed with CPAP

## 2018-08-01 NOTE — Progress Notes (Signed)
Location:  [place] Room Number: [place] of Service:  SNF (31)  Provider: Man X Mast, NP   PCP: Mast, Man X, NP Patient Care Team: Mast, Man X, NP as PCP - General (Internal Medicine) Jettie Booze, MD as PCP - Cardiology (Cardiology) Wilford Corner, MD as Consulting Physician (Gastroenterology) Marlou Sa, Tonna Corner, MD as Consulting Physician (Orthopedic Surgery) Marcial Pacas, MD as Consulting Physician (Neurology) Franchot Gallo, MD as Consulting Physician (Urology) Kary Kos, MD as Consulting Physician (Neurosurgery)  Extended Emergency Contact Information Primary Emergency Contact: Merlinda Frederick Address: [address]          [place] [address] [place] Phone: [phone] Mobile Phone: [phone] Relation: Spouse Secondary Emergency Contact: [place] Mobile Phone: [phone] Relation: Son  Code Status: DNR Goals of care:  Advanced Directive information Advanced Directives 08/01/2018  Does Patient Have a Medical Advance Directive? Yes  Type of Paramedic of [place];Living will;Out of facility DNR (pink MOST or yellow form)  Does patient want to make changes to medical advance directive? No - Patient declined  Copy of [place] in Chart? Yes - validated most recent copy scanned in chart (See row information)  Would patient like information on creating a medical advance directive? -  Pre-existing out of facility DNR order (yellow form or pink MOST form) Yellow form placed in chart (order not valid for inpatient use)     Allergies  Allergen Reactions  . Nsaids Other (See Comments)    Due to chronic kidney failure  . Butorphanol Other (See Comments)    agitation Constipation Produced a lot of urine, made patient feel crazy  . Sulfa Antibiotics Rash  . Bisoprolol Fumarate Other (See Comments)    Doesn't remember  . Butorphanol Tartrate  Other (See Comments)    Produced a lot of urine, made patient feel crazy  . Codeine Nausea And Vomiting  . Demerol [Meperidine] Nausea And Vomiting  . Imipramine     Sweating, facial dysfunction   . Meperidine And Related Nausea And Vomiting  . Statins     MYALGIAS  . Tequin [Gatifloxacin] Other (See Comments)    Caused hypoglycemia Low blood sugar  . Brilinta [Ticagrelor] Rash    CAUSES PETECHIAE, PURPURA  . Pseudoephedrine Hcl Palpitations  . Septra [Sulfamethoxazole-Trimethoprim] Rash  . Sulfamethoxazole-Trimethoprim Rash    Chief Complaint  Patient presents with  . Discharge Note    Discharge from SNF     HPI:  78 y.o. female was admitted to SNF [facility] following hospitalization 07/17/18-07/19/18 for syncope, CXR, CT head, EKG, echocardiogram in hospital were unremarkable. She has regain physical strength, hemodynamically stable to be discharged home. Her Metoprolol was decreased from 100 mg to '75mg'$  day due to low blood pressure measurement.    The patient has history of depression, on Venlafaxine '75mg'$  qd, CAD, stable on Plavix '75mg'$  qd, Lipitor '10mg'$  qd,  Imdur '120mg'$  qd, Ranolazine '1000mg'$  bid. Polymyalgia rheumatica,  stable on Prednisone '5mg'$  2x/wk, 7.'5mg'$  5x/wk. OAB, stable on Myrbetriq '50mg'$  qd. HTN, blood pressure is controlled on Metoprolol '75mg'$  qd. Hypothyroidism, on Levothyroxine 52mg qd, last TSH 0.775 07/18/18. Sleep apnea, managed with CPAP  Past Medical History:  Diagnosis Date  . Anemia   . Arthritis    "fingers, back, shoulders, hips" (04/10/2016)  . Chronic diastolic CHF (congestive heart failure) (HCC)    [facility] >> Las[facility] started   . CKD (  chronic kidney disease), stage III ([place])   . Coronary artery disease    a. s/p CABG 2000 (SVG/ free LIMA Y graft to the diagonal and distal LAD, SVG to the OM 1, and SVG to the PDA) . b. Cath 12/19/2014 90% dSVG to RCA s/p 2 overlapping DES. c. LHC 2016, 2017 no intervention except diuresis needed. d. Low  risk nuc 03/2016. e. Cath 12/2017 patent LIMA-LAD, SVG-OM, SVG-PDA but occluded SVG to diag.   . Depression    with anxious component  . GERD (gastroesophageal reflux disease)   . History of hiatal hernia   . Hyperlipidemia   . Hypothyroidism   . Obesity   . OSA on CPAP   . PMR (polymyalgia rheumatica) (HCC)   . Pneumonia    "2-3 times" (04/10/2016)  . Stable angina (HCC)    microvascular, improved with Ranexa  . Subclavian artery stenosis (HCC)    a. L by cath note in 2016.  Marland Kitchen TIA (transient ischemic attack)     Past Surgical History:  Procedure Laterality Date  . BREAST BIOPSY Right 1964  . CARDIAC CATHETERIZATION  08   patent grafts, no culprit lesions, EF 65%  . CARDIAC CATHETERIZATION N/A 12/19/2014   Procedure: Left Heart Cath and Cors/Grafts Angiography;  Surgeon: Jettie Booze, MD;  Location: [place] CV LAB;  Service: Cardiovascular;  Laterality: N/A;  . CARDIAC CATHETERIZATION N/A 12/19/2014   Procedure: Coronary Stent Intervention;  Surgeon: Jettie Booze, MD;  Location: [place] [facility];  Service: Cardiovascular;  Laterality: N/A;  . CARDIAC CATHETERIZATION N/A 01/01/2015   Procedure: Left Heart Cath and Cors/Grafts Angiography;  Surgeon: Jettie Booze, MD;  Location: [place] [facility];  Service: Cardiovascular;  Laterality: N/A;  . CARDIAC CATHETERIZATION N/A 09/20/2015   Procedure: Left Heart Cath and Cors/Grafts Angiography;  Surgeon: Jettie Booze, MD;  Location: [place] [facility];  Service: Cardiovascular;  Laterality: N/A;  . CATARACT EXTRACTION W/ INTRAOCULAR LENS  IMPLANT, BILATERAL Bilateral 2011  . COLONOSCOPY WITH PROPOFOL N/A 07/27/2016   Procedure: COLONOSCOPY WITH PROPOFOL;  Surgeon: Wilford Corner, MD;  Location: [place];  Service: Endoscopy;  Laterality: N/A;  . CORONARY ARTERY BYPASS GRAFT  2000   ASCVD, multivessel, S./P.  . DILATION AND CURETTAGE OF UTERUS  1980s  . ESOPHAGOGASTRODUODENOSCOPY (EGD) WITH PROPOFOL N/A  07/27/2016   Procedure: ESOPHAGOGASTRODUODENOSCOPY (EGD) WITH PROPOFOL;  Surgeon: Wilford Corner, MD;  Location: [facility] [place];  Service: Endoscopy;  Laterality: N/A;  . FRACTURE SURGERY    . LEFT HEART CATH AND CORS/GRAFTS ANGIOGRAPHY N/A 01/12/2018   Procedure: LEFT HEART CATH AND CORS/GRAFTS ANGIOGRAPHY;  Surgeon: Jettie Booze, MD;  Location: [place] [facility] LAB;  Service: Cardiovascular;  Laterality: N/A;  . [place]      reports that she quit smoking about 48 years ago. Her smoking use included cigarettes. She has a 3.50 pack-year smoking history. She has never used smokeless tobacco. She reports that she does not drink alcohol or use drugs. Social History   Socioeconomic History  . Marital status: Married    Spouse name: Not on file  . Number of children: 2  . Years of education: College  . Highest education level: Not on file  Occupational History  . Occupation: Retired Cytogeneticist  Social Needs  . Financial resource strain: Not hard at all  . Food insecurity:    Worry: Never true    Inability: Never true  . Transportation needs:  Medical: No    Non-medical: No  Tobacco Use  . Smoking status: Former Smoker    Packs/day: 0.50    Years: 7.00    Pack years: 3.50    Types: Cigarettes    Last attempt to quit: 08/21/1969    Years since quitting: 48.9  . Smokeless tobacco: Never Used  Substance and Sexual Activity  . Alcohol use: No    Alcohol/week: 0.0 standard drinks    Frequency: Never    Comment: rare  . Drug use: No  . Sexual activity: Never  Lifestyle  . Physical activity:    Days per week: 0 days    Minutes per session: 0 min  . Stress: To some extent  Relationships  . Social connections:    Talks on phone: More than three times a week    Gets together: More than three times a week    Attends religious service: Never    Active member of club or organization: No    Attends meetings of clubs or organizations: Never     Relationship status: Married  . Intimate partner violence:    Fear of current or ex partner: No    Emotionally abused: No    Physically abused: No    Forced sexual activity: No  Other Topics Concern  . Not on file  Social History Narrative   Social History     Social History Narrative       Lives in [place] with husband.   Diet: Regular   Do you drink/eat things with caffeine? 1 cup caffeine per day.  Not much coffee   Marital status: Married                           What year were you married? 1964   Do you live in a house, apartment, assisted living, condo, trailer, etc)? Here at [facility]   Is it one or more stories?   How many persons live in your home? 2   Do you have any pets in your home? No   Current or past profession: Engineer, production (RN)   Do you exercise? I did                                                Type & how often: Swimming, Tai Chi, exercise classes   Do you have a living will? yes   Do you have a DNR Form? Yes   Do you have a POA/HPOA forms?             Functional Status Survey:    Allergies  Allergen Reactions  . Nsaids Other (See Comments)    Due to chronic kidney failure  . Butorphanol Other (See Comments)    agitation Constipation Produced a lot of urine, made patient feel crazy  . Sulfa Antibiotics Rash  . Bisoprolol Fumarate Other (See Comments)    Doesn't remember  . Butorphanol Tartrate Other (See Comments)    Produced a lot of urine, made patient feel crazy  . Codeine Nausea And Vomiting  . Demerol [Meperidine] Nausea And Vomiting  . Imipramine     Sweating, facial dysfunction   . Meperidine And Related Nausea And Vomiting  . Statins     MYALGIAS  . Tequin [Gatifloxacin] Other (See Comments)  Caused hypoglycemia Low blood sugar  . Brilinta [Ticagrelor] Rash    CAUSES PETECHIAE, PURPURA  . Pseudoephedrine Hcl Palpitations  . Septra [Sulfamethoxazole-Trimethoprim] Rash  . Sulfamethoxazole-Trimethoprim Rash    Pertinent   Health Maintenance Due  Topic Date Due  . INFLUENZA VACCINE  10/22/2018  . DEXA SCAN  Completed  . PNA vac Low Risk Adult  Completed    Medications: Outpatient Encounter Medications as of 08/01/2018  Medication Sig  . alendronate (FOSAMAX) 70 MG tablet Take 70 mg by mouth once a week. Take with a full glass of water on an empty stomach.  Marland Kitchen atorvastatin (LIPITOR) 10 MG tablet Take 10 mg by mouth daily.  . calcium carbonate (OS-CAL) 600 MG TABS tablet Take 600 mg by mouth 2 (two) times daily with a meal.  . Cholecalciferol (VITAMIN D PO) Take 2,000 Units by mouth daily.   . clopidogrel (PLAVIX) 75 MG tablet TAKE 1 TABLET ONCE DAILY.  Marland Kitchen Coenzyme Q10 10 MG capsule Take 10 mg by mouth at bedtime.   . fluticasone (FLONASE) 50 MCG/ACT nasal spray Place 2 sprays into both nostrils daily as needed for allergies.   . Glucosamine-Chondroit-Vit C-Mn (GLUCOSAMINE 1500 COMPLEX) CAPS Take 2 capsules by mouth daily after breakfast.  . isosorbide mononitrate (IMDUR) 60 MG 24 hr tablet Take 2 tablets (120 mg total) by mouth daily.  Marland Kitchen levocetirizine (XYZAL) 5 MG tablet Take 1 tablet (5 mg total) by mouth every evening.  Marland Kitchen levothyroxine (SYNTHROID, LEVOTHROID) 75 MCG tablet TAKE 1 TABLET ONCE DAILY ON EMPTY STOMACH.  . metoprolol succinate (TOPROL-XL) 100 MG 24 hr tablet TAKE 1 TABLET DAILY WITH OR IMMEDIATELY FOLLOWING A MEAL.  . mirabegron ER (MYRBETRIQ) 50 MG TB24 tablet Take 1 tablet (50 mg total) by mouth daily.  . nitroGLYCERIN (NITROSTAT) 0.4 MG SL tablet Place 0.4 mg under the tongue every 5 (five) minutes as needed for chest pain. X 3 doses  . nystatin (MYCOSTATIN/NYSTOP) powder Apply topically 2 (two) times a day. Apply topically to abd/groin & skin folds x4 wks  . predniSONE (DELTASONE) 5 MG tablet Take 5-7.5 mg by mouth See admin instructions. Take 5 mg on Sun and Wednesday.Take 7.5 mg all other days, take With food or milk.  . ranolazine (RANEXA) 1000 MG SR tablet TAKE 1 TABLET BY MOUTH TWICE  DAILY.  Marland Kitchen venlafaxine XR (EFFEXOR-XR) 75 MG 24 hr capsule TAKE 3 CAPSULES ONCE A DAY.   No facility-administered encounter medications on file as of 08/01/2018.   ROS was provided with assistance of staff  Review of Systems  Constitutional: Negative for activity change, appetite change, chills, diaphoresis, fatigue and fever.  HENT: Positive for hearing loss. Negative for congestion and voice change.   Respiratory: Positive for shortness of breath. Negative for cough and wheezing.        DOE  Cardiovascular: Positive for leg swelling. Negative for chest pain and palpitations.  Gastrointestinal: Negative for abdominal distention, abdominal pain, constipation, diarrhea, nausea and vomiting.  Genitourinary: Negative for difficulty urinating, dysuria, hematuria and urgency.  Musculoskeletal: Positive for back pain and gait problem.  Skin: Negative for color change and pallor.  Neurological: Negative for dizziness, syncope, speech difficulty, weakness, light-headedness and headaches.       Memory lapses.   Psychiatric/Behavioral: Negative for agitation, behavioral problems, confusion, hallucinations and sleep disturbance. The patient is not nervous/anxious.     Vitals:   08/01/18 1500  BP: 120/60  Pulse: 67  Resp: 20  Temp: 98.5 F (36.9 C)  TempSrc:  Oral  SpO2: 96%  Weight: 196 lb (88.9 kg)  Height: '4\' 10"'$  (1.473 m)   Body mass index is 40.96 kg/m. Physical Exam Vitals signs and nursing note reviewed.  Constitutional:      General: She is not in acute distress.    Appearance: Normal appearance. She is obese. She is not ill-appearing or diaphoretic.  HENT:     Head: Normocephalic and atraumatic.     Nose: Nose normal. No congestion or rhinorrhea.     Mouth/Throat:     Mouth: Mucous membranes are moist.  Eyes:     Extraocular Movements: Extraocular movements intact.     Conjunctiva/sclera: Conjunctivae normal.     Pupils: Pupils are equal, round, and reactive to light.   Neck:     Musculoskeletal: Normal range of motion and neck supple.  Cardiovascular:     Rate and Rhythm: Normal rate and regular rhythm.     Heart sounds: No murmur.  Pulmonary:     Effort: Pulmonary effort is normal.     Breath sounds: No wheezing, rhonchi or rales.  Abdominal:     General: There is no distension.     Palpations: Abdomen is soft.     Tenderness: There is no abdominal tenderness. There is no right CVA tenderness, left CVA tenderness, guarding or rebound.  Musculoskeletal:     Right lower leg: Edema present.     Left lower leg: Edema present.     Comments: Trace edema BLE. Ambulates with walker.  Skin:    General: Skin is warm and dry.  Neurological:     General: No focal deficit present.     Mental Status: She is alert. Mental status is at baseline.     Cranial Nerves: No cranial nerve deficit.     Sensory: No sensory deficit.     Motor: No weakness.     Coordination: Coordination normal.     Gait: Gait abnormal.     Comments: Oriented to person and place.   Psychiatric:        Mood and Affect: Mood normal.        Behavior: Behavior normal.        Thought Content: Thought content normal.        Judgment: Judgment normal.     Labs reviewed: Basic Metabolic Panel: Recent Labs    07/17/18 2157 07/18/18 0426 07/19/18 0342 07/26/18  NA 139 139 139 142  K 4.4 3.8 3.4* 4.7  CL 108 108 107  --   CO2 '24 23 24  '$ --   GLUCOSE 133* 100* 91  --   BUN 31* 29* 21 24*  CREATININE 1.34* 1.12* 1.11* 1.2*  CALCIUM 8.3* 8.5* 8.3*  --    Liver Function Tests: Recent Labs    06/07/18 0710 07/17/18 2157 07/26/18  AST 10 14* 8*  ALT '7 10 7  '$ ALKPHOS  --  41 45  BILITOT 0.3 0.3  --   PROT 6.0* 6.0*  --   ALBUMIN  --  3.4*  --    No results for input(s): LIPASE, AMYLASE in the last 8760 hours. No results for input(s): AMMONIA in the last 8760 hours. CBC: Recent Labs    01/24/18 1042 07/17/18 2157 07/18/18 0426 07/19/18 0342 07/26/18  WBC 14.4* 14.4* 12.1*  10.8* 13.1  NEUTROABS 10,253* 12.7*  --   --   --   HGB 11.2* 10.6* 10.2* 10.6* 10.6*  HCT 34.3* 33.9* 32.7* 33.6* 32*  MCV 91.0 99.1 98.2  98.5  --   PLT 303 230 224 224 265   Cardiac Enzymes: Recent Labs    01/12/18 0007 01/12/18 0549 07/17/18 2157  TROPONINI <0.03 <0.03 <0.03   BNP: Invalid input(s): POCBNP CBG: No results for input(s): GLUCAP in the last 8760 hours.  Procedures and Imaging Studies During Stay: Dg Chest 2 View  Result Date: 07/17/2018 CLINICAL DATA:  Initial evaluation for acute syncope. EXAM: CHEST - 2 VIEW COMPARISON:  Prior radiograph from 01/11/2018 FINDINGS: Median sternotomy wires underlying CABG markers and surgical clips noted. Stable cardiomegaly. Mediastinal silhouette normal. Aortic atherosclerosis. Lungs mildly hypoinflated. Mild diffuse pulmonary vascular congestion without overt pulmonary edema. No definite pleural effusion. No focal infiltrates. No pneumothorax. No acute osseous finding. IMPRESSION: 1. Cardiomegaly with mild diffuse pulmonary vascular congestion without overt pulmonary edema. 2. Aortic atherosclerosis. Electronically Signed   By: Jeannine Boga M.D.   On: 07/17/2018 21:43   Ct Head Wo Contrast  Result Date: 07/17/2018 CLINICAL DATA:  78 year old female with involuntary movement of arms. EXAM: CT HEAD WITHOUT CONTRAST TECHNIQUE: Contiguous axial images were obtained from the base of the skull through the vertex without intravenous contrast. COMPARISON:  Head and cervical spine CT 04/04/2017 and earlier. Brain MRI 01/07/2016. FINDINGS: Brain: Stable cerebral volume since 2019. Chronic white matter hypodensity and heterogeneity in the deep gray matter nuclei. Stable gray-white matter differentiation throughout the brain. No midline shift, ventriculomegaly, mass effect, evidence of mass lesion, intracranial hemorrhage or evidence of cortically based acute infarction. No cortical encephalomalacia identified. Vascular: Calcified  atherosclerosis at the skull base. No suspicious intracranial vascular hyperdensity. Skull: No acute osseous abnormality identified. Sinuses/Orbits: Visualized paranasal sinuses and mastoids are stable and well pneumatized. Other: No acute orbit or scalp soft tissue findings. IMPRESSION: Stable non contrast CT appearance of the brain since 2019 with chronic small vessel disease. Electronically Signed   By: Genevie Ann M.D.   On: 07/17/2018 23:17    Assessment/Plan:   GERD (gastroesophageal reflux disease) Complaining heart burns sometimes, will adding Protonix '40mg'$  qd in setting of Plavix use. Observe.   CAD (coronary artery disease) Stable, continue Plavix '75mg'$  qd, Lipitor '10mg'$  qd,  Imdur '120mg'$  qd, Ranolazine '1000mg'$  bid.  Essential hypertension Stable, continue Metoprolol '75mg'$  qd.   Hypothyroidism Stable, continue Levothyroxine 43mg qd, last TSH 0.775 07/18/18.    Hypertonicity of bladder Stable, no urinary retention, continue Myrbetriq '50mg'$  qd.   Depression, recurrent (HCC) Her mood is stable, continue Venlafaxine '75mg'$  qd.   Polymyalgia rheumatica (HCC) Stable, continue Prednisone '5mg'$  2x/wk, 7.'5mg'$  5x/wk.   Sleep apnea managed with CPAP   Syncope Stable.   Kidney disease, chronic, stage III (moderate, EGFR 30-59 ml/min) (HCC) Stable, last creat 1.24 07/26/18.   Anemia, unspecified Stable, at her baseline, Hgb 10s.      Patient is being discharged with the following home health services:    Patient is being discharged with the following durable medical equipment:    Patient has been advised to f/u with their PCP in 1-2 weeks to for a transitions of care visit.  Social services at their facility was responsible for arranging this appointment.  Pt was provided with adequate prescriptions of noncontrolled medications to reach the scheduled appointment .  For controlled substances, a limited supply was provided as appropriate for the individual patient.  If the pt normally  receives these medications from a pain clinic or has a contract with another physician, these medications should be received from that clinic or physician only).    Future  labs/tests needed: none

## 2018-08-01 NOTE — Assessment & Plan Note (Signed)
Her mood is stable, continue Venlafaxine 75mg  qd.

## 2018-08-01 NOTE — Assessment & Plan Note (Signed)
Stable, continue Levothyroxine 23mcg qd, last TSH 0.775 07/18/18.

## 2018-08-01 NOTE — Assessment & Plan Note (Signed)
Stable, continue Plavix 75mg  qd, Lipitor 10mg  qd,  Imdur 120mg  qd, Ranolazine 1000mg  bid.

## 2018-08-01 NOTE — Assessment & Plan Note (Signed)
Stable, continue Metoprolol 75mg  qd.

## 2018-08-01 NOTE — Assessment & Plan Note (Signed)
Stable

## 2018-08-01 NOTE — Assessment & Plan Note (Signed)
Stable, continue Prednisone 5mg  2x/wk, 7.5mg  5x/wk.

## 2018-08-01 NOTE — Assessment & Plan Note (Signed)
Complaining heart burns sometimes, will adding Protonix 40mg  qd in setting of Plavix use. Observe.

## 2018-08-01 NOTE — Assessment & Plan Note (Signed)
Stable, no urinary retention, continue Myrbetriq 50mg  qd.

## 2018-08-02 NOTE — Assessment & Plan Note (Signed)
Stable, at her baseline, Hgb 10s.

## 2018-08-02 NOTE — Assessment & Plan Note (Signed)
Stable, last creat 1.24 07/26/18.

## 2018-08-16 ENCOUNTER — Other Ambulatory Visit: Payer: Self-pay | Admitting: *Deleted

## 2018-08-16 MED ORDER — NYSTATIN 100000 UNIT/GM EX POWD: Freq: Two times a day (BID) | CUTANEOUS | 1 refills | Status: DC

## 2018-08-16 NOTE — Telephone Encounter (Signed)
Patient called requesting refill on her powder.   Pended Rx and sent to [facility] for approval.

## 2018-08-18 ENCOUNTER — Encounter: Payer: Self-pay | Admitting: Nurse Practitioner

## 2018-08-18 ENCOUNTER — Other Ambulatory Visit: Payer: Self-pay

## 2018-08-18 ENCOUNTER — Non-Acute Institutional Stay: Payer: Medicare Other | Admitting: Nurse Practitioner

## 2018-08-18 DIAGNOSIS — I2510 Atherosclerotic heart disease of native coronary artery without angina pectoris: Secondary | ICD-10-CM | POA: Diagnosis not present

## 2018-08-18 DIAGNOSIS — I10 Essential (primary) hypertension: Secondary | ICD-10-CM

## 2018-08-18 DIAGNOSIS — M353 Polymyalgia rheumatica: Secondary | ICD-10-CM | POA: Diagnosis not present

## 2018-08-18 DIAGNOSIS — K219 Gastro-esophageal reflux disease without esophagitis: Secondary | ICD-10-CM

## 2018-08-18 DIAGNOSIS — F339 Major depressive disorder, recurrent, unspecified: Secondary | ICD-10-CM

## 2018-08-18 DIAGNOSIS — E039 Hypothyroidism, unspecified: Secondary | ICD-10-CM

## 2018-08-18 DIAGNOSIS — R635 Abnormal weight gain: Secondary | ICD-10-CM

## 2018-08-18 NOTE — Assessment & Plan Note (Signed)
blood pressure is controlled, continue  Metoprolol 75mg  qd.

## 2018-08-18 NOTE — Assessment & Plan Note (Signed)
stable, continue  Prednisone 5mg  3x/wk, 7.5mg  4x/wk.

## 2018-08-18 NOTE — Assessment & Plan Note (Addendum)
no angina, continue Plavix, Lipitor, Ranexa 1000mg  bid, prn NTG, Isosorbide 120mg  qd. F/u Cardiology q 6 months.

## 2018-08-18 NOTE — Patient Instructions (Signed)
will monitor weight at home, call if wight gain #2-3Ibs/24hours, #3-5Ibs/week.

## 2018-08-18 NOTE — Progress Notes (Signed)
Location:   clinic [place]   Place of Service:  Clinic (12) Provider: Marlana Latus NP  Code Status: DNR Goals of Care: [place] 08/18/2018  Does Patient Have a Medical Advance Directive? Yes  Type of Advance Directive Out of facility DNR (pink MOST or yellow form)  Does patient want to make changes to medical advance directive? No - Patient declined  Copy of [place] in Chart? -  Would patient like information on creating a medical advance directive? -  Pre-existing out of facility DNR order (yellow form or pink MOST form) Pink MOST form placed in chart (order not valid for inpatient use);Yellow form placed in chart (order not valid for inpatient use)     Chief Complaint  Patient presents with  . Medical Management of Chronic Issues    2 week follow up    HPI: Patient is a 78 y.o. female seen today for medical management of chronic diseases.     The patient has history of depression, stable on Effexor 75mg  qd. GERD stable, on Pantoprazole 40mg  qd. CAD, no angina, on Plavix, Lipitor, Ranexa 1000mg  bid, prn NTG, Isosorbide 120mg  qd.  Polymyalgia rheumatica, stable, on Prednisone 5mg  3x/wk, 7.5mg  4x/wk. OAB, on Myrbetriq 50mg  qd. Hypothyroidism, on Levothyroxine 57mcg qd, last TSH 0.775 07/18/18. HTN, blood pressure is controlled, on Metoprolol 75mg  qd.    Past Medical History:  Diagnosis Date  . Anemia   . Arthritis    "fingers, back, shoulders, hips" (04/10/2016)  . Chronic diastolic CHF (congestive heart failure) (HCC)    a. normal EF, [facility]P [facility] in 6/17 33 >> Lasix started   . CKD (chronic kidney disease), stage III ([place])   . Coronary artery disease    a. s/p CABG 2000 (SVG/ free LIMA Y graft to the diagonal and distal LAD, SVG to the OM 1, and SVG to the PDA) . b. Cath 12/19/2014 90% dSVG to RCA s/p 2 overlapping DES. c. LHC 2016, 2017 no intervention except diuresis needed. d. Low risk nuc 03/2016. e. Cath 12/2017 patent LIMA-LAD, SVG-OM, SVG-PDA  but occluded SVG to diag.   . Depression    with anxious component  . GERD (gastroesophageal reflux disease)   . History of hiatal hernia   . Hyperlipidemia   . Hypothyroidism   . Obesity   . OSA on CPAP   . PMR (polymyalgia rheumatica) (HCC)   . Pneumonia    "2-3 times" (04/10/2016)  . Stable angina (HCC)    microvascular, improved with Ranexa  . Subclavian artery stenosis (HCC)    a. L by cath note in 2016.  Marland Kitchen TIA (transient ischemic attack)     Past Surgical History:  Procedure Laterality Date  . BREAST BIOPSY Right 1964  . CARDIAC CATHETERIZATION  08   patent grafts, no culprit lesions, EF 65%  . CARDIAC CATHETERIZATION N/A 12/19/2014   Procedure: Left Heart Cath and Cors/Grafts Angiography;  Surgeon: Jettie Booze, MD;  Location: [place] [facility];  Service: Cardiovascular;  Laterality: N/A;  . CARDIAC CATHETERIZATION N/A 12/19/2014   Procedure: Coronary Stent Intervention;  Surgeon: Jettie Booze, MD;  Location: [place] [facility];  Service: Cardiovascular;  Laterality: N/A;  . CARDIAC CATHETERIZATION N/A 01/01/2015   Procedure: Left Heart Cath and Cors/Grafts Angiography;  Surgeon: Jettie Booze, MD;  Location: [place] [facility];  Service: Cardiovascular;  Laterality: N/A;  . CARDIAC CATHETERIZATION N/A 09/20/2015   Procedure: Left Heart Cath and Cors/Grafts Angiography;  Surgeon: Jettie Booze, MD;  Location: [place] [facility];  Service: Cardiovascular;  Laterality: N/A;  . CATARACT EXTRACTION W/ INTRAOCULAR LENS  IMPLANT, BILATERAL Bilateral 2011  . COLONOSCOPY WITH PROPOFOL N/A 07/27/2016   Procedure: COLONOSCOPY WITH PROPOFOL;  Surgeon: Wilford Corner, MD;  Location: [place];  Service: Endoscopy;  Laterality: N/A;  . CORONARY ARTERY BYPASS GRAFT  2000   ASCVD, multivessel, S./P.  . DILATION AND CURETTAGE OF UTERUS  1980s  . ESOPHAGOGASTRODUODENOSCOPY (EGD) WITH PROPOFOL N/A 07/27/2016   Procedure: ESOPHAGOGASTRODUODENOSCOPY (EGD) WITH  PROPOFOL;  Surgeon: Wilford Corner, MD;  Location: [place];  Service: Endoscopy;  Laterality: N/A;  . FRACTURE SURGERY    . LEFT HEART CATH AND CORS/GRAFTS ANGIOGRAPHY N/A 01/12/2018   Procedure: LEFT HEART CATH AND CORS/GRAFTS ANGIOGRAPHY;  Surgeon: Jettie Booze, MD;  Location: [place] [facility];  Service: Cardiovascular;  Laterality: N/A;  . PATELLA FRACTURE SURGERY Left 1993    Allergies  Allergen Reactions  . Nsaids Other (See Comments)    Due to chronic kidney failure  . Butorphanol Other (See Comments)    agitation Constipation Produced a lot of urine, made patient feel crazy  . Sulfa Antibiotics Rash  . Bisoprolol Fumarate Other (See Comments)    Doesn't remember  . Butorphanol Tartrate Other (See Comments)    Produced a lot of urine, made patient feel crazy  . Codeine Nausea And Vomiting  . Demerol [Meperidine] Nausea And Vomiting  . Imipramine     Sweating, facial dysfunction   . Meperidine And Related Nausea And Vomiting  . Statins     MYALGIAS  . Tequin [Gatifloxacin] Other (See Comments)    Caused hypoglycemia Low blood sugar  . Brilinta [Ticagrelor] Rash    CAUSES PETECHIAE, PURPURA  . Pseudoephedrine Hcl Palpitations  . Septra [Sulfamethoxazole-Trimethoprim] Rash  . Sulfamethoxazole-Trimethoprim Rash    Allergies as of 08/18/2018      Reactions   Nsaids Other (See Comments)   Due to chronic kidney failure   Butorphanol Other (See Comments)   agitation Constipation Produced a lot of urine, made patient feel crazy   Sulfa Antibiotics Rash   Bisoprolol Fumarate Other (See Comments)   Doesn't remember   Butorphanol Tartrate Other (See Comments)   Produced a lot of urine, made patient feel crazy   Codeine Nausea And Vomiting   Demerol [meperidine] Nausea And Vomiting   Imipramine    Sweating, facial dysfunction    Meperidine And Related Nausea And Vomiting   Statins    MYALGIAS   Tequin [gatifloxacin] Other (See Comments)   Caused  hypoglycemia Low blood sugar   Brilinta [ticagrelor] Rash   CAUSES PETECHIAE, PURPURA   Pseudoephedrine Hcl Palpitations   Septra [sulfamethoxazole-trimethoprim] Rash   Sulfamethoxazole-trimethoprim Rash      Medication List       Accurate as of Aug 18, 2018  4:55 PM. If you have any questions, ask your nurse or doctor.        alendronate 70 MG tablet Commonly known as:  FOSAMAX Take 70 mg by mouth once a week. Take with a full glass of water on an empty stomach.   atorvastatin 10 MG tablet Commonly known as:  LIPITOR Take 10 mg by mouth daily.   calcium carbonate 600 MG Tabs tablet Commonly known as:  OS-CAL Take 600 mg by mouth 2 (two) times daily with a meal.   clopidogrel 75 MG tablet Commonly known as:  PLAVIX TAKE 1 TABLET ONCE DAILY.  Coenzyme Q10 10 MG capsule Take 10 mg by mouth at bedtime.   fluticasone 50 MCG/ACT nasal spray Commonly known as:  FLONASE Place 2 sprays into both nostrils daily as needed for allergies.   Glucosamine 1500 Complex Caps Take 2 capsules by mouth daily after breakfast.   isosorbide mononitrate 60 MG 24 hr tablet Commonly known as:  IMDUR Take 2 tablets (120 mg total) by mouth daily.   levocetirizine 5 MG tablet Commonly known as:  XYZAL Take 1 tablet (5 mg total) by mouth every evening.   levothyroxine 75 MCG tablet Commonly known as:  SYNTHROID TAKE 1 TABLET ONCE DAILY ON EMPTY STOMACH.   metoprolol succinate 25 MG 24 hr tablet Commonly known as:  TOPROL-XL Take 75 mg by mouth daily. TAKE 1 1/2 TABLETS BY MOUTH DAILY What changed:  Another medication with the same name was removed. Continue taking this medication, and follow the directions you see here. Changed by:  Oswin Johal X Mitch Arquette, NP   mirabegron ER 50 MG Tb24 tablet Commonly known as:  Myrbetriq Take 1 tablet (50 mg total) by mouth daily.   nitroGLYCERIN 0.4 MG SL tablet Commonly known as:  NITROSTAT Place 0.4 mg under the tongue every 5 (five) minutes as needed  for chest pain. X 3 doses   nystatin powder Commonly known as:  MYCOSTATIN/NYSTOP Apply topically 2 (two) times a day for 29 days. Apply topically to abd/groin & skin folds x4 wks   pantoprazole 40 MG tablet Commonly known as:  PROTONIX Take 40 mg by mouth daily.   predniSONE 5 MG tablet Commonly known as:  DELTASONE Take 5-7.5 mg by mouth See admin instructions. Take 5 mg on Sun and Wednesday.Take 7.5 mg all other days, take With food or milk.   ranolazine 1000 MG SR tablet Commonly known as:  RANEXA TAKE 1 TABLET BY MOUTH TWICE DAILY.   venlafaxine XR 75 MG 24 hr capsule Commonly known as:  EFFEXOR-XR TAKE 3 CAPSULES ONCE A DAY.   VITAMIN D PO Take 2,000 Units by mouth daily.       Review of Systems:  Review of Systems  Constitutional: Positive for unexpected weight change. Negative for activity change, appetite change, chills, diaphoresis, fatigue and fever.       #7Ibs weight gain in 2 weeks.   HENT: Positive for hearing loss. Negative for congestion and voice change.   Respiratory: Positive for shortness of breath. Negative for cough and wheezing.        Chronic DOE  Cardiovascular: Negative for chest pain, palpitations and leg swelling.  Gastrointestinal: Negative for abdominal distention, abdominal pain, constipation, diarrhea, nausea and vomiting.  Genitourinary: Negative for difficulty urinating, dysuria, hematuria and urgency.       1-2x/night.   Musculoskeletal: Positive for back pain and gait problem.  Skin: Negative for color change.  Neurological: Negative for dizziness, speech difficulty, weakness and headaches.  Psychiatric/Behavioral: Positive for sleep disturbance. Negative for agitation, behavioral problems and hallucinations. The patient is not nervous/anxious.        Chronic awakes at night.     Health Maintenance  Topic Date Due  . INFLUENZA VACCINE  10/22/2018  . TETANUS/TDAP  04/20/2022  . DEXA SCAN  Completed  . PNA vac Low Risk Adult   Completed    Physical Exam: Vitals:   08/18/18 1457  BP: (!) 110/58  Pulse: 77  Temp: 98.6 F (37 C)  TempSrc: Oral  SpO2: 96%  Weight: 203 lb (92.1 kg)  Height: 4'  10" (1.473 m)   Body mass index is 42.43 kg/m. Physical Exam Vitals signs reviewed.  Constitutional:      General: She is not in acute distress.    Appearance: Normal appearance. She is obese. She is not ill-appearing, toxic-appearing or diaphoretic.  HENT:     Head: Normocephalic and atraumatic.     Nose: Nose normal.     Mouth/Throat:     Mouth: Mucous membranes are moist.  Eyes:     Extraocular Movements: Extraocular movements intact.     Conjunctiva/sclera: Conjunctivae normal.     Pupils: Pupils are equal, round, and reactive to light.  Neck:     Musculoskeletal: Normal range of motion and neck supple.  Cardiovascular:     Rate and Rhythm: Normal rate and regular rhythm.     Heart sounds: No murmur.  Pulmonary:     Effort: Pulmonary effort is normal.     Breath sounds: No wheezing, rhonchi or rales.  Abdominal:     General: There is no distension.     Palpations: Abdomen is soft.     Tenderness: There is no abdominal tenderness. There is no right CVA tenderness, left CVA tenderness, guarding or rebound.  Musculoskeletal:     Right lower leg: No edema.     Left lower leg: No edema.     Comments: Ambulates with walker.   Skin:    General: Skin is warm and dry.  Neurological:     General: No focal deficit present.     Mental Status: She is alert. Mental status is at baseline.     Cranial Nerves: No cranial nerve deficit.     Motor: No weakness.     Coordination: Coordination normal.     Gait: Gait abnormal.     Comments: Oriented to person and place.   Psychiatric:        Mood and Affect: Mood normal.        Behavior: Behavior normal.        Thought Content: Thought content normal.        Judgment: Judgment normal.     Labs reviewed: Basic Metabolic Panel: Recent Labs    01/24/18 1042  06/07/18 0710 07/17/18 2157 07/18/18 0426 07/19/18 0342 07/26/18  NA  --  142 139 139 139 142  K  --  3.9 4.4 3.8 3.4* 4.7  CL  --  106 108 108 107  --   CO2  --  20 24 23 24   --   GLUCOSE CANCELED 89 133* 100* 91  --   BUN  --  27* 31* 29* 21 24*  CREATININE  --  1.23* 1.34* 1.12* 1.11* 1.2*  CALCIUM  --  8.9 8.3* 8.5* 8.3*  --   TSH CANCELED 1.33  --  0.775  --   --    Liver Function Tests: Recent Labs    06/07/18 0710 07/17/18 2157 07/26/18  AST 10 14* 8*  ALT 7 10 7   ALKPHOS  --  41 45  BILITOT 0.3 0.3  --   PROT 6.0* 6.0*  --   ALBUMIN  --  3.4*  --    No results for input(s): LIPASE, AMYLASE in the last 8760 hours. No results for input(s): AMMONIA in the last 8760 hours. CBC: Recent Labs    01/24/18 1042 07/17/18 2157 07/18/18 0426 07/19/18 0342 07/26/18  WBC 14.4* 14.4* 12.1* 10.8* 13.1  NEUTROABS 10,253* 12.7*  --   --   --  HGB 11.2* 10.6* 10.2* 10.6* 10.6*  HCT 34.3* 33.9* 32.7* 33.6* 32*  MCV 91.0 99.1 98.2 98.5  --   PLT 303 230 224 224 265   Lipid Panel: Recent Labs    01/12/18 0549 06/07/18 0710  CHOL 163 159  HDL 59 76  LDLCALC 64 65  TRIG 198* 96  CHOLHDL 2.8 2.1   Lab Results  Component Value Date   HGBA1C 5.6 01/24/2018    Procedures since last visit: No results found.  Assessment/Plan  CAD (coronary artery disease) no angina, continue Plavix, Lipitor, Ranexa 1000mg  bid, prn NTG, Isosorbide 120mg  qd. F/u Cardiology q 6 months.  Essential hypertension blood pressure is controlled, continue  Metoprolol 75mg  qd.    Polymyalgia rheumatica (HCC) stable, continue  Prednisone 5mg  3x/wk, 7.5mg  4x/wk.   Depression, recurrent (HCC) Stable, continue Effexor 75mg  qd.  GERD (gastroesophageal reflux disease)  stable, continue Pantoprazole 40mg  qd.  Hypothyroidism Stable, continue Levothyroxine 59mcg qd, last TSH 0.775 07/18/18.  Weight gain #7Ibs weight gain in 2 weeks, no s/s of fluid retention, will monitor weight at home, call  if wight gain #2-3Ibs/24hours, #3-5Ibs/week.    Labs/tests ordered:  none  Next appt:  10/06/2018

## 2018-08-18 NOTE — Assessment & Plan Note (Signed)
Stable, continue Levothyroxine 40mcg qd, last TSH 0.775 07/18/18.

## 2018-08-18 NOTE — Assessment & Plan Note (Signed)
#  7Ibs weight gain in 2 weeks, no s/s of fluid retention, will monitor weight at home, call if wight gain #2-3Ibs/24hours, #3-5Ibs/week.

## 2018-08-18 NOTE — Assessment & Plan Note (Signed)
Stable, continue Effexor 75mg  qd.

## 2018-08-18 NOTE — Assessment & Plan Note (Signed)
stable, continue Pantoprazole 40mg qd.  

## 2018-08-25 ENCOUNTER — Telehealth: Payer: Self-pay | Admitting: *Deleted

## 2018-08-25 NOTE — Telephone Encounter (Signed)
Received fax from [facility] stating that patient needs a larger size of the Nystatin cream to last 4 weeks other than 15g.   Pended Rx for approval. Sent to [facility]

## 2018-08-26 MED ORDER — NYSTATIN 100000 UNIT/GM EX POWD: Freq: Two times a day (BID) | CUTANEOUS | 1 refills | Status: AC

## 2018-09-02 ENCOUNTER — Other Ambulatory Visit: Payer: Self-pay | Admitting: *Deleted

## 2018-09-02 MED ORDER — PANTOPRAZOLE SODIUM 40 MG PO TBEC: 40.0000 mg | DELAYED_RELEASE_TABLET | Freq: Every day | ORAL | 3 refills | Status: DC

## 2018-09-12 ENCOUNTER — Other Ambulatory Visit: Payer: Self-pay | Admitting: Nurse Practitioner

## 2018-09-12 DIAGNOSIS — F339 Major depressive disorder, recurrent, unspecified: Secondary | ICD-10-CM

## 2018-09-14 ENCOUNTER — Telehealth: Payer: Self-pay

## 2018-09-14 NOTE — Telephone Encounter (Signed)

## 2018-09-16 ENCOUNTER — Telehealth: Payer: Self-pay

## 2018-09-16 NOTE — Telephone Encounter (Signed)
Called pt to see if she would like to switch to in office visit instead of virtual. Pt mailbox is full and was unable to leave a message.

## 2018-09-19 ENCOUNTER — Telehealth (INDEPENDENT_AMBULATORY_CARE_PROVIDER_SITE_OTHER): Payer: Medicare Other | Admitting: Interventional Cardiology

## 2018-09-19 ENCOUNTER — Encounter: Payer: Self-pay | Admitting: Interventional Cardiology

## 2018-09-19 ENCOUNTER — Other Ambulatory Visit: Payer: Self-pay

## 2018-09-19 VITALS — BP 112/67 | HR 69 | Ht <= 58 in | Wt 196.0 lb

## 2018-09-19 DIAGNOSIS — I5032 Chronic diastolic (congestive) heart failure: Secondary | ICD-10-CM

## 2018-09-19 DIAGNOSIS — E785 Hyperlipidemia, unspecified: Secondary | ICD-10-CM

## 2018-09-19 DIAGNOSIS — I10 Essential (primary) hypertension: Secondary | ICD-10-CM

## 2018-09-19 DIAGNOSIS — I2510 Atherosclerotic heart disease of native coronary artery without angina pectoris: Secondary | ICD-10-CM

## 2018-09-19 MED ORDER — METOPROLOL SUCCINATE ER 50 MG PO TB24: ORAL_TABLET | ORAL | 3 refills | Status: DC

## 2018-09-19 NOTE — Patient Instructions (Signed)
Medication Instructions:  Your physician has recommended you make the following change in your medication:   A new prescription for your Metoprolol Succinate has been sent in for a 50MG  tablet. Take 1.5 tablets orally once a day.  If you need a refill on your cardiac medications before your next appointment, please call your pharmacy.   Lab work:  None ordered today   Testing/Procedures:  None ordered today  Follow-Up: At [facility], you and your health needs are our priority.  As part of our continuing mission to provide you with exceptional heart care, we have created designated Provider Care Teams.  These Care Teams include your primary Cardiologist (physician) and Advanced Practice Providers (APPs -  Physician Assistants and Nurse Practitioners) who all work together to provide you with the care you need, when you need it. You will need a follow up appointment in 12 months.  Please call our office 2 months in advance to schedule this appointment.  You may see Larae Grooms, MD or one of the following Advanced Practice Providers on your designated Care Team:   [employer], PA-C Melina Copa, PA-C . Ermalinda Barrios, PA-C

## 2018-09-19 NOTE — Progress Notes (Signed)
Virtual Visit via Video Note   This visit type was conducted due to national recommendations for restrictions regarding the COVID-19 Pandemic (e.g. social distancing) in an effort to limit this patient's exposure and mitigate transmission in our community.  Due to her co-morbid illnesses, this patient is at least at moderate risk for complications without adequate follow up.  This format is felt to be most appropriate for this patient at this time.  All issues noted in this document were discussed and addressed.  A limited physical exam was performed with this format.  Please refer to the patient's chart for her consent to telehealth for [facility].   Unable to access camera  Date:  09/19/2018   ID:  Erin Ingram, DOB 1940-05-29, MRN [id number]  Patient Location: Home Provider Location: Office  PCP:  Mast, Man X, NP  Cardiologist:  Larae Grooms, MD  Electrophysiologist:  None   Evaluation Performed:  Follow-Up Visit  Chief Complaint:  CAD  History of Present Illness:    Erin Ingram is a 78 y.o. female with history ofCAD (CABG 2000, 2 DES to SVG - RCA 11/2014), polymyalgia rheumatica on steroids, TIA, OSA on CPAP, hypothyroidism, HTN, HLD, CKD stage III, left subclavian artery stenosis and chronic diastolic heart failure who presents for post-hospital follow-up.  She has history of 2000 with SVG/free LIMA Y graft to the diagonal and distal LAD, SVG to OM1 and SVG to PDA. She underwent 2 DES to SVG to RCA in September 2016. Cath in 2017 showed stable anatomy with occluded SVG-diag and she had a low risk myoview back in 2018. On 01/11/18 she recently presented to the hospital with a weeks' worth of intermittent chest pain, left arm pain and back pain, mostly at night when at rest. She was admitted with troponins cycled and negative x3. Given her symptoms she was set up for cardiac cath noted above with patent LIMA-LAD, SVG-OM, SVG-PDA but occluded SVG to diag. Normal LVEDP  and EF noted at 55-65%. The recommendation was to continue with medical therapy at this time.  The patient previously stopped ASA due to easy bruising. The diagnosis of L subclavian stenosis comes from cath report 11/2014 indicated Left subclavian stenosis prevented left radial access to the aorta. Carotid duplex 2017 showed no significant findings.  Repeat shoulder pain in 2019 showed:  Prox LAD to Mid LAD lesion is 25% stenosed.  Ost Cx to Dist Cx lesion is 90% stenosed. SVG to OM is widely patent.  Mid RCA lesion is 90% stenosed. SVG to PDA is patent. Patent stents in distal graft.  SVG to diagonal is occluded.  Mid LAD lesion is 90% stenosed. LIMA to LAD is patent.  The left ventricular systolic function is normal.  LV end diastolic pressure is normal. LVEDP 10 mm Hg.  The left ventricular ejection fraction is 55-65% by visual estimate.  There is no aortic valve stenosis.  Pain in shoulder reoslved with PT.  In the past, it was noted, "She does not exercise.  SHe has some DOE.  She has chronic fatigue.  She uses CPAP."  She was hospitalized in 07/2018.  SHe got lightheaded.  SHe nearly passed out.  BP meds were decreased.  She feels better on the current dose of medicines.  The patient does not have symptoms concerning for COVID-19 infection (fever, chills, cough, or new shortness of breath).    Past Medical History:  Diagnosis Date   Anemia    Arthritis    "  fingers, back, shoulders, hips" (04/10/2016)   Chronic diastolic CHF (congestive heart failure) (HCC)    a. normal EF, LVEDP at [facility] in 6/17 33 >> Lasix started    CKD (chronic kidney disease), stage III (HCC)    Coronary artery disease    a. s/p CABG 2000 (SVG/ free LIMA Y graft to the diagonal and distal LAD, SVG to the OM 1, and SVG to the PDA) . b. Cath 12/19/2014 90% dSVG to RCA s/p 2 overlapping DES. c. LHC 2016, 2017 no intervention except diuresis needed. d. Low risk nuc 03/2016. e. Cath 12/2017 patent  LIMA-LAD, SVG-OM, SVG-PDA but occluded SVG to diag.    Depression    with anxious component   GERD (gastroesophageal reflux disease)    History of hiatal hernia    Hyperlipidemia    Hypothyroidism    Obesity    OSA on CPAP    PMR (polymyalgia rheumatica) (HCC)    Pneumonia    "2-3 times" (04/10/2016)   Stable angina (HCC)    microvascular, improved with Ranexa   Subclavian artery stenosis ([place])    a. L by cath note in 2016.   TIA (transient ischemic attack)    Past Surgical History:  Procedure Laterality Date   BREAST BIOPSY Right 1964   CARDIAC CATHETERIZATION  08   patent grafts, no culprit lesions, EF 65%   CARDIAC CATHETERIZATION N/A 12/19/2014   Procedure: Left Heart Cath and Cors/Grafts Angiography;  Surgeon: Jettie Booze, MD;  Location: [place] [facility];  Service: Cardiovascular;  Laterality: N/A;   CARDIAC CATHETERIZATION N/A 12/19/2014   Procedure: Coronary Stent Intervention;  Surgeon: Jettie Booze, MD;  Location: [place] [facility];  Service: Cardiovascular;  Laterality: N/A;   CARDIAC CATHETERIZATION N/A 01/01/2015   Procedure: Left Heart Cath and Cors/Grafts Angiography;  Surgeon: Jettie Booze, MD;  Location: [place] [facility];  Service: Cardiovascular;  Laterality: N/A;   CARDIAC CATHETERIZATION N/A 09/20/2015   Procedure: Left Heart Cath and Cors/Grafts Angiography;  Surgeon: Jettie Booze, MD;  Location: [place] [facility];  Service: Cardiovascular;  Laterality: N/A;   CATARACT EXTRACTION W/ INTRAOCULAR LENS  IMPLANT, BILATERAL Bilateral 2011   COLONOSCOPY WITH PROPOFOL N/A 07/27/2016   Procedure: COLONOSCOPY WITH PROPOFOL;  Surgeon: Wilford Corner, MD;  Location: [place];  Service: Endoscopy;  Laterality: N/A;   CORONARY ARTERY BYPASS GRAFT  2000   ASCVD, multivessel, S./P.   DILATION AND CURETTAGE OF UTERUS  1980s   ESOPHAGOGASTRODUODENOSCOPY (EGD) WITH PROPOFOL N/A 07/27/2016   Procedure:  ESOPHAGOGASTRODUODENOSCOPY (EGD) WITH PROPOFOL;  Surgeon: Wilford Corner, MD;  Location: [place];  Service: Endoscopy;  Laterality: N/A;   FRACTURE SURGERY     LEFT HEART CATH AND CORS/GRAFTS ANGIOGRAPHY N/A 01/12/2018   Procedure: LEFT HEART CATH AND CORS/GRAFTS ANGIOGRAPHY;  Surgeon: Jettie Booze, MD;  Location: [place] [facility];  Service: Cardiovascular;  Laterality: N/A;   PATELLA FRACTURE SURGERY Left 1993     Current Meds  Medication Sig   alendronate (FOSAMAX) 70 MG tablet Take 70 mg by mouth once a week. Take with a full glass of water on an empty stomach.   atorvastatin (LIPITOR) 10 MG tablet Take 10 mg by mouth daily.   calcium carbonate (OS-CAL) 600 MG TABS tablet Take 600 mg by mouth 2 (two) times daily with a meal.   Cholecalciferol (VITAMIN D PO) Take 2,000 Units by mouth daily.    clopidogrel (PLAVIX) 75 MG tablet TAKE 1 TABLET ONCE  DAILY.   Coenzyme Q10 10 MG capsule Take 10 mg by mouth at bedtime.    fluticasone (FLONASE) 50 MCG/ACT nasal spray Place 2 sprays into both nostrils daily as needed for allergies.    Glucosamine-Chondroit-Vit C-Mn (GLUCOSAMINE 1500 COMPLEX) CAPS Take 2 capsules by mouth daily after breakfast.   isosorbide mononitrate (IMDUR) 60 MG 24 hr tablet Take 2 tablets (120 mg total) by mouth daily.   levocetirizine (XYZAL) 5 MG tablet TAKE 1 TABLET ONCE DAILY IN THE EVENING.   levothyroxine (SYNTHROID) 75 MCG tablet TAKE 1 TABLET ONCE DAILY ON EMPTY STOMACH.   metoprolol succinate (TOPROL-XL) 25 MG 24 hr tablet Take 75 mg by mouth daily.    mirabegron ER (MYRBETRIQ) 50 MG TB24 tablet Take 1 tablet (50 mg total) by mouth daily.   nitroGLYCERIN (NITROSTAT) 0.4 MG SL tablet Place 0.4 mg under the tongue every 5 (five) minutes as needed for chest pain. X 3 doses   nystatin (MYCOSTATIN/NYSTOP) powder Apply topically 2 (two) times a day for 29 days. Apply topically to abd/groin & skin folds x4 wks   pantoprazole (PROTONIX) 40  MG tablet Take 1 tablet (40 mg total) by mouth daily.   predniSONE (DELTASONE) 5 MG tablet Take 5-7.5 mg by mouth See admin instructions. Take 5 mg on Sun and Wednesday.Take 7.5 mg all other days, take With food or milk.   ranolazine (RANEXA) 1000 MG SR tablet TAKE 1 TABLET BY MOUTH TWICE DAILY.   venlafaxine XR (EFFEXOR-XR) 75 MG 24 hr capsule TAKE 3 CAPSULES ONCE A DAY.     Allergies:   Nsaids, Butorphanol, Sulfa antibiotics, Bisoprolol fumarate, Butorphanol tartrate, Codeine, Demerol [meperidine], Imipramine, Meperidine and related, Statins, Tequin [gatifloxacin], Brilinta [ticagrelor], Pseudoephedrine hcl, Septra [sulfamethoxazole-trimethoprim], and Sulfamethoxazole-trimethoprim   Social History   Tobacco Use   Smoking status: Former Smoker    Packs/day: 0.50    Years: 7.00    Pack years: 3.50    Types: Cigarettes    Quit date: 08/21/1969    Years since quitting: 49.1   Smokeless tobacco: Never Used  Substance Use Topics   Alcohol use: No    Alcohol/week: 0.0 standard drinks    Frequency: Never    Comment: rare   Drug use: No     Family Hx: The patient's family history includes Diabetes in her brother; Heart attack in her father and mother; Heart disease in her father and mother; Pulmonary embolism in her brother; Stroke in her paternal grandmother. There is no history of Hypertension.  ROS:   Please see the history of present illness.    Intentional weight loss; activity increased All other systems reviewed and are negative.   Prior CV studies:   The following studies were reviewed today:  Cath in 2019  Labs/Other Tests and Data Reviewed:    EKG:  No ECG reviewed.  Recent Labs: 07/18/2018: TSH 0.775 07/26/2018: ALT 7; BUN 24; Creatinine 1.2; Hemoglobin 10.6; Platelets 265; Potassium 4.7; Sodium 142   Recent Lipid Panel Lab Results  Component Value Date/Time   CHOL 159 06/07/2018 07:10 AM   TRIG 96 06/07/2018 07:10 AM   HDL 76 06/07/2018 07:10 AM   CHOLHDL  2.1 06/07/2018 07:10 AM   LDLCALC 65 06/07/2018 07:10 AM    Wt Readings from Last 3 Encounters:  09/19/18 196 lb (88.9 kg)  08/18/18 203 lb (92.1 kg)  08/01/18 196 lb (88.9 kg)     Objective:    Vital Signs:  BP 112/67    Pulse 69  Ht 4\' 10"  (1.473 m)    Wt 196 lb (88.9 kg)    BMI 40.96 kg/m    VITAL SIGNS:  reviewed GEN:  no acute distress RESPIRATORY:  no shortness of breath PSYCH:  normal affect exam limited buy phone format  ASSESSMENT & PLAN:    1. CAD: No angina on medical therapy. Minimal angina.  Stopped aspirin.  Plavix monotherapy.  2. Chronic diastolic heart failure: No signs of volume overload.  3. HTN: The current medical regimen is effective;  continue present plan and medications. Swelling improved off of amlodipine.  4. Hyperlipidemia: LDL 65 in 05/2018.  5. Morbid obesity: SHe has lost wight and continues to try.   COVID-19 Education: The signs and symptoms of COVID-19 were discussed with the patient and how to seek care for testing (follow up with PCP or arrange E-visit).  The importance of social distancing was discussed today.  Time:   Today, I have spent 15 minutes with the patient with telehealth technology discussing the above problems.     Medication Adjustments/Labs and Tests Ordered: Current medicines are reviewed at length with the patient today.  Concerns regarding medicines are outlined above.   Tests Ordered: No orders of the defined types were placed in this encounter.   Medication Changes: No orders of the defined types were placed in this encounter.   Follow Up:  Virtual Visit in 1 year(s)  Signed, Larae Grooms, MD  09/19/2018 2:45 PM    [facility]

## 2018-09-26 ENCOUNTER — Other Ambulatory Visit: Payer: Self-pay | Admitting: Nurse Practitioner

## 2018-10-05 ENCOUNTER — Other Ambulatory Visit: Payer: Self-pay | Admitting: Nurse Practitioner

## 2018-10-06 ENCOUNTER — Encounter: Payer: Self-pay | Admitting: Nurse Practitioner

## 2018-10-06 ENCOUNTER — Other Ambulatory Visit: Payer: Self-pay

## 2018-10-06 ENCOUNTER — Non-Acute Institutional Stay: Payer: Medicare Other | Admitting: Nurse Practitioner

## 2018-10-06 DIAGNOSIS — B379 Candidiasis, unspecified: Secondary | ICD-10-CM

## 2018-10-06 DIAGNOSIS — M810 Age-related osteoporosis without current pathological fracture: Secondary | ICD-10-CM

## 2018-10-06 DIAGNOSIS — E039 Hypothyroidism, unspecified: Secondary | ICD-10-CM

## 2018-10-06 DIAGNOSIS — R441 Visual hallucinations: Secondary | ICD-10-CM

## 2018-10-06 DIAGNOSIS — K219 Gastro-esophageal reflux disease without esophagitis: Secondary | ICD-10-CM | POA: Diagnosis not present

## 2018-10-06 DIAGNOSIS — I2510 Atherosclerotic heart disease of native coronary artery without angina pectoris: Secondary | ICD-10-CM

## 2018-10-06 DIAGNOSIS — F339 Major depressive disorder, recurrent, unspecified: Secondary | ICD-10-CM

## 2018-10-06 DIAGNOSIS — M353 Polymyalgia rheumatica: Secondary | ICD-10-CM

## 2018-10-06 DIAGNOSIS — I10 Essential (primary) hypertension: Secondary | ICD-10-CM

## 2018-10-06 DIAGNOSIS — N318 Other neuromuscular dysfunction of bladder: Secondary | ICD-10-CM

## 2018-10-06 NOTE — Assessment & Plan Note (Signed)
No urinary retention, continue Myrbetriq 50mg  qd.

## 2018-10-06 NOTE — Assessment & Plan Note (Signed)
Under the left breast, abd skin folds, groins skin fold redness, apply Nystatin powder bid. Observe.

## 2018-10-06 NOTE — Progress Notes (Signed)
Location:   clinic [facility] [place]   Place of Service:  Clinic (12) Provider: Marlana Latus NP  Code Status: DNR Goals of Care: [place] 10/06/2018  Does Patient Have a Medical Advance Directive? Yes  Type of Advance Directive Out of facility DNR (pink MOST or yellow form)  Does patient want to make changes to medical advance directive? No - Patient declined  Copy of [place] in Chart? -  Would patient like information on creating a medical advance directive? -  Pre-existing out of facility DNR order (yellow form or pink MOST form) Yellow form placed in chart (order not valid for inpatient use);Pink MOST form placed in chart (order not valid for inpatient use)     Chief Complaint  Patient presents with  . Medical Management of Chronic Issues    4 month follow up     HPI: Patient is a 78 y.o. female seen today for medical management of chronic diseases.    The patient has history of depression, stable, on Venlafaxine 75mg  qd. CAD, no angina since last seen, on Ranexa 1000mg  bid, Isosorbide 60mg  qd, prn NTG. Polymyalgia Rheumatica, stable on Prednisone 5mg  qd. GERD, stable on Pantoprazole 40mg  qd. OAB, managed with Myrbetriq 50mg  qd. HTN, blood pressure is controlled, on Metoprolol 50mg  qd. Hypothyroidism, on Levothyroxine 59mcg qd, last TSH 0.775 07/18/18.  Osteoporosis, no recent fx, on Fosamax weekly.    Past Medical History:  Diagnosis Date  . Anemia   . Arthritis    "fingers, back, shoulders, hips" (04/10/2016)  . Chronic diastolic CHF (congestive heart failure) (HCC)    a. normal EF, LV[facility]P at [facility] in 6/17 33 >> Lasix started   . CKD (chronic kidney disease), stage III ([place])   . Coronary artery disease    a. s/p CABG 2000 (SVG/ free LIMA Y graft to the diagonal and distal LAD, SVG to the OM 1, and SVG to the PDA) . b. Cath 12/19/2014 90% dSVG to RCA s/p 2 overlapping DES. c. LHC 2016, 2017 no intervention except diuresis needed. d. Low risk nuc 03/2016. e.  Cath 12/2017 patent LIMA-LAD, SVG-OM, SVG-PDA but occluded SVG to diag.   . Depression    with anxious component  . GERD (gastroesophageal reflux disease)   . History of hiatal hernia   . Hyperlipidemia   . Hypothyroidism   . Obesity   . OSA on CPAP   . PMR (polymyalgia rheumatica) (HCC)   . Pneumonia    "2-3 times" (04/10/2016)  . Stable angina (HCC)    microvascular, improved with Ranexa  . Subclavian artery stenosis (HCC)    a. L by cath note in 2016.  Marland Kitchen TIA (transient ischemic attack)     Past Surgical History:  Procedure Laterality Date  . BREAST BIOPSY Right 1964  . CARDIAC CATHETERIZATION  08   patent grafts, no culprit lesions, EF 65%  . CARDIAC CATHETERIZATION N/A 12/19/2014   Procedure: Left Heart Cath and Cors/Grafts Angiography;  Surgeon: Jettie Booze, MD;  Location: [place] [facility];  Service: Cardiovascular;  Laterality: N/A;  . CARDIAC CATHETERIZATION N/A 12/19/2014   Procedure: Coronary Stent Intervention;  Surgeon: Jettie Booze, MD;  Location: [place] [facility];  Service: Cardiovascular;  Laterality: N/A;  . CARDIAC CATHETERIZATION N/A 01/01/2015   Procedure: Left Heart Cath and Cors/Grafts Angiography;  Surgeon: Jettie Booze, MD;  Location: [place] [facility];  Service: Cardiovascular;  Laterality: N/A;  . CARDIAC CATHETERIZATION N/A 09/20/2015  Procedure: Left Heart Cath and Cors/Grafts Angiography;  Surgeon: Jettie Booze, MD;  Location: [place] [facility];  Service: Cardiovascular;  Laterality: N/A;  . CATARACT EXTRACTION W/ INTRAOCULAR LENS  IMPLANT, BILATERAL Bilateral 2011  . COLONOSCOPY WITH PROPOFOL N/A 07/27/2016   Procedure: COLONOSCOPY WITH PROPOFOL;  Surgeon: Wilford Corner, MD;  Location: [place];  Service: Endoscopy;  Laterality: N/A;  . CORONARY ARTERY BYPASS GRAFT  2000   ASCVD, multivessel, S./P.  . DILATION AND CURETTAGE OF UTERUS  1980s  . ESOPHAGOGASTRODUODENOSCOPY (EGD) WITH PROPOFOL N/A 07/27/2016    Procedure: ESOPHAGOGASTRODUODENOSCOPY (EGD) WITH PROPOFOL;  Surgeon: Wilford Corner, MD;  Location: [place];  Service: Endoscopy;  Laterality: N/A;  . FRACTURE SURGERY    . LEFT HEART CATH AND CORS/GRAFTS ANGIOGRAPHY N/A 01/12/2018   Procedure: LEFT HEART CATH AND CORS/GRAFTS ANGIOGRAPHY;  Surgeon: Jettie Booze, MD;  Location: [place] [facility];  Service: Cardiovascular;  Laterality: N/A;  . PATELLA FRACTURE SURGERY Left 1993    Allergies  Allergen Reactions  . Nsaids Other (See Comments)    Due to chronic kidney failure  . Butorphanol Other (See Comments)    agitation Constipation Produced a lot of urine, made patient feel crazy  . Sulfa Antibiotics Rash  . Bisoprolol Fumarate Other (See Comments)    Doesn't remember  . Butorphanol Tartrate Other (See Comments)    Produced a lot of urine, made patient feel crazy  . Codeine Nausea And Vomiting  . Demerol [Meperidine] Nausea And Vomiting  . Imipramine     Sweating, facial dysfunction   . Meperidine And Related Nausea And Vomiting  . Statins     MYALGIAS  . Tequin [Gatifloxacin] Other (See Comments)    Caused hypoglycemia Low blood sugar  . Brilinta [Ticagrelor] Rash    CAUSES PETECHIAE, PURPURA  . Pseudoephedrine Hcl Palpitations  . Septra [Sulfamethoxazole-Trimethoprim] Rash  . Sulfamethoxazole-Trimethoprim Rash    Allergies as of 10/06/2018      Reactions   Nsaids Other (See Comments)   Due to chronic kidney failure   Butorphanol Other (See Comments)   agitation Constipation Produced a lot of urine, made patient feel crazy   Sulfa Antibiotics Rash   Bisoprolol Fumarate Other (See Comments)   Doesn't remember   Butorphanol Tartrate Other (See Comments)   Produced a lot of urine, made patient feel crazy   Codeine Nausea And Vomiting   Demerol [meperidine] Nausea And Vomiting   Imipramine    Sweating, facial dysfunction    Meperidine And Related Nausea And Vomiting   Statins    MYALGIAS   Tequin  [gatifloxacin] Other (See Comments)   Caused hypoglycemia Low blood sugar   Brilinta [ticagrelor] Rash   CAUSES PETECHIAE, PURPURA   Pseudoephedrine Hcl Palpitations   Septra [sulfamethoxazole-trimethoprim] Rash   Sulfamethoxazole-trimethoprim Rash      Medication List       Accurate as of October 06, 2018 11:59 PM. If you have any questions, ask your nurse or doctor.        alendronate 70 MG tablet Commonly known as: FOSAMAX Take 70 mg by mouth once a week. Take with a full glass of water on an empty stomach.   atorvastatin 10 MG tablet Commonly known as: LIPITOR Take 10 mg by mouth daily.   calcium carbonate 600 MG Tabs tablet Commonly known as: OS-CAL Take 600 mg by mouth 2 (two) times daily with a meal.   clopidogrel 75 MG tablet Commonly known as: PLAVIX TAKE 1 TABLET ONCE  DAILY.   Coenzyme Q10 10 MG capsule Take 1 capsule (10 mg total) by mouth at bedtime.   fluticasone 50 MCG/ACT nasal spray Commonly known as: FLONASE Place 2 sprays into both nostrils daily as needed for allergies.   Glucosamine 1500 Complex Caps Take 2 capsules by mouth daily after breakfast.   isosorbide mononitrate 60 MG 24 hr tablet Commonly known as: IMDUR Take 2 tablets (120 mg total) by mouth daily.   levocetirizine 5 MG tablet Commonly known as: XYZAL TAKE 1 TABLET ONCE DAILY IN THE EVENING.   levothyroxine 75 MCG tablet Commonly known as: SYNTHROID TAKE 1 TABLET ONCE DAILY ON EMPTY STOMACH.   metoprolol succinate 50 MG 24 hr tablet Commonly known as: TOPROL-XL Take 1.5 tablets orally once a day. Take with or immediately following a meal.   Myrbetriq 50 MG Tb24 tablet Generic drug: mirabegron ER TAKE 1 TABLET BY MOUTH DAILY.   nitroGLYCERIN 0.4 MG SL tablet Commonly known as: NITROSTAT Place 0.4 mg under the tongue every 5 (five) minutes as needed for chest pain. X 3 doses   nystatin powder Commonly known as: MYCOSTATIN/NYSTOP Apply 100,000 g topically 2 (two) times  daily. What changed: when to take this Changed by: Favor Kreh X Estanislao Harmon, NP   pantoprazole 40 MG tablet Commonly known as: PROTONIX Take 1 tablet (40 mg total) by mouth daily.   predniSONE 5 MG tablet Commonly known as: DELTASONE Take 5-7.5 mg by mouth See admin instructions. Take 5 mg on Sun and Wednesday.Take 7.5 mg all other days, take With food or milk.   ranolazine 1000 MG SR tablet Commonly known as: RANEXA TAKE 1 TABLET BY MOUTH TWICE DAILY.   venlafaxine XR 75 MG 24 hr capsule Commonly known as: EFFEXOR-XR TAKE 3 CAPSULES ONCE A DAY.   VITAMIN D PO Take 2,000 Units by mouth daily.       Review of Systems:  Review of Systems  Constitutional: Negative for activity change, appetite change, chills, diaphoresis, fatigue and fever.  HENT: Positive for hearing loss. Negative for congestion and voice change.   Eyes: Negative for visual disturbance.  Respiratory: Positive for shortness of breath. Negative for cough and wheezing.        DOE  Cardiovascular: Negative.  Negative for chest pain, palpitations and leg swelling.  Gastrointestinal: Negative for abdominal distention, abdominal pain, constipation, diarrhea, nausea and vomiting.  Genitourinary: Negative for difficulty urinating, dysuria and urgency.  Musculoskeletal: Positive for arthralgias, back pain and gait problem.  Skin: Positive for rash. Negative for color change and pallor.  Neurological: Negative for dizziness, speech difficulty, weakness and headaches.  Psychiatric/Behavioral: Negative for agitation, behavioral problems, hallucinations and sleep disturbance. The patient is not nervous/anxious.     Health Maintenance  Topic Date Due  . INFLUENZA VACCINE  10/22/2018  . TETANUS/TDAP  04/20/2022  . DEXA SCAN  Completed  . PNA vac Low Risk Adult  Completed    Physical Exam: Vitals:   10/06/18 1342  BP: (!) 118/50  Pulse: 72  Temp: 98.6 F (37 C)  TempSrc: Oral  SpO2: 97%  Weight: 201 lb 3.2 oz (91.3 kg)   Height: 4\' 10"  (1.473 m)   Body mass index is 42.05 kg/m. Physical Exam Constitutional:      General: She is not in acute distress.    Appearance: Normal appearance. She is obese. She is not ill-appearing, toxic-appearing or diaphoretic.  HENT:     Head: Normocephalic and atraumatic.     Nose: Nose normal.  Mouth/Throat:     Mouth: Mucous membranes are moist.  Eyes:     Extraocular Movements: Extraocular movements intact.     Conjunctiva/sclera: Conjunctivae normal.     Pupils: Pupils are equal, round, and reactive to light.  Neck:     Musculoskeletal: Normal range of motion and neck supple.  Cardiovascular:     Rate and Rhythm: Normal rate and regular rhythm.     Heart sounds: No murmur.  Pulmonary:     Effort: Pulmonary effort is normal.     Breath sounds: No wheezing, rhonchi or rales.  Chest:     Chest wall: No tenderness.  Abdominal:     General: Bowel sounds are normal.     Palpations: Abdomen is soft.     Tenderness: There is no abdominal tenderness. There is no right CVA tenderness, left CVA tenderness, guarding or rebound.  Musculoskeletal:     Right lower leg: No edema.     Left lower leg: No edema.     Comments: Ambulates with walker.   Skin:    General: Skin is warm and dry.     Findings: Rash present.     Comments: Redness in abd skin folds, groins, under breasts  Neurological:     General: No focal deficit present.     Mental Status: She is alert and oriented to person, place, and time. Mental status is at baseline.     Motor: No weakness.     Coordination: Coordination normal.     Gait: Gait abnormal.  Psychiatric:        Mood and Affect: Mood normal.        Behavior: Behavior normal.        Thought Content: Thought content normal.        Judgment: Judgment normal.     Labs reviewed: Basic Metabolic Panel: Recent Labs    01/24/18 1042 06/07/18 0710 07/17/18 2157 07/18/18 0426 07/19/18 0342 07/26/18  NA  --  142 139 139 139 142  K  --   3.9 4.4 3.8 3.4* 4.7  CL  --  106 108 108 107  --   CO2  --  20 24 23 24   --   GLUCOSE CANCELED 89 133* 100* 91  --   BUN  --  27* 31* 29* 21 24*  CREATININE  --  1.23* 1.34* 1.12* 1.11* 1.2*  CALCIUM  --  8.9 8.3* 8.5* 8.3*  --   TSH CANCELED 1.33  --  0.775  --   --    Liver Function Tests: Recent Labs    06/07/18 0710 07/17/18 2157 07/26/18  AST 10 14* 8*  ALT 7 10 7   ALKPHOS  --  41 45  BILITOT 0.3 0.3  --   PROT 6.0* 6.0*  --   ALBUMIN  --  3.4*  --    No results for input(s): LIPASE, AMYLASE in the last 8760 hours. No results for input(s): AMMONIA in the last 8760 hours. CBC: Recent Labs    01/24/18 1042 07/17/18 2157 07/18/18 0426 07/19/18 0342 07/26/18  WBC 14.4* 14.4* 12.1* 10.8* 13.1  NEUTROABS 10,253* 12.7*  --   --   --   HGB 11.2* 10.6* 10.2* 10.6* 10.6*  HCT 34.3* 33.9* 32.7* 33.6* 32*  MCV 91.0 99.1 98.2 98.5  --   PLT 303 230 224 224 265   Lipid Panel: Recent Labs    01/12/18 0549 06/07/18 0710  CHOL 163 159  HDL 59 76  LDLCALC 64 65  TRIG 198* 96  CHOLHDL 2.8 2.1   Lab Results  Component Value Date   HGBA1C 5.6 01/24/2018    Procedures since last visit: No results found.  Assessment/Plan  CAD (coronary artery disease)  no angina since last seen, continue  Ranexa 1000mg  bid, Isosorbide 60mg  qd, prn NTG.  Essential hypertension blood pressure is controlled, continue  Metoprolol 50mg  qd.  GERD (gastroesophageal reflux disease) Stable, continue Pantoprazole 40mg  qd.   Hypothyroidism Stable, continue Levothyroxine 92mcg qd, last TSH 0.775 07/18/18.    Osteoporosis No recent fxs, continue Fosamax weekly.   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   Depression, recurrent (HCC) Her mood is stable, continue Venlafaxine 75mg  qd.   Polymyalgia rheumatica (HCC) Stable, continue Prednisone 5mg  qd.   Visual hallucination Resolved.   Candidiasis Under the left breast, abd skin folds, groins skin  fold redness, apply Nystatin powder bid. Observe.    Labs/tests ordered:  None  Next appt:  4 months in clinic [place] of move to [place] for care needs.

## 2018-10-06 NOTE — Patient Instructions (Signed)
F/u in clinic [facility] in 4 months

## 2018-10-06 NOTE — Assessment & Plan Note (Signed)
Stable, continue Pantoprazole 40mg qd.  

## 2018-10-06 NOTE — Assessment & Plan Note (Signed)
Resolved

## 2018-10-06 NOTE — Assessment & Plan Note (Signed)
no angina since last seen, continue  Ranexa 1000mg  bid, Isosorbide 60mg  qd, prn NTG.

## 2018-10-06 NOTE — Assessment & Plan Note (Signed)
Stable, continue Prednisone 5mg qd 

## 2018-10-06 NOTE — Assessment & Plan Note (Signed)
blood pressure is controlled, continue  Metoprolol 50mg  qd.

## 2018-10-06 NOTE — Assessment & Plan Note (Signed)
Stable, continue Levothyroxine 57mcg qd, last TSH 0.775 07/18/18.

## 2018-10-06 NOTE — Assessment & Plan Note (Signed)
Her mood is stable, continue Venlafaxine 75mg  qd.

## 2018-10-06 NOTE — Assessment & Plan Note (Signed)
No recent fxs, continue Fosamax weekly.

## 2018-10-07 ENCOUNTER — Encounter: Payer: Self-pay | Admitting: Nurse Practitioner

## 2018-10-07 MED ORDER — NYSTATIN 100000 UNIT/GM EX POWD: 100000.0000 g | Freq: Two times a day (BID) | CUTANEOUS | 1 refills | Status: AC

## 2018-10-07 MED ORDER — COENZYME Q10 10 MG PO CAPS: 10.0000 mg | ORAL_CAPSULE | Freq: Every day | ORAL | 1 refills | Status: AC

## 2018-10-13 ENCOUNTER — Other Ambulatory Visit: Payer: Self-pay

## 2018-10-13 ENCOUNTER — Encounter (HOSPITAL_COMMUNITY): Payer: Self-pay

## 2018-10-13 ENCOUNTER — Inpatient Hospital Stay (HOSPITAL_COMMUNITY)
Admission: EM | Admit: 2018-10-13 | Discharge: 2018-10-15 | DRG: 303 | Disposition: A | Discharge status: Home Health | Payer: Medicare Other | Source: Skilled Nursing Facility | Attending: Family Medicine | Admitting: Family Medicine

## 2018-10-13 ENCOUNTER — Emergency Department (HOSPITAL_COMMUNITY): Payer: Medicare Other

## 2018-10-13 DIAGNOSIS — Z87891 Personal history of nicotine dependence: Secondary | ICD-10-CM

## 2018-10-13 DIAGNOSIS — T380X5A Adverse effect of glucocorticoids and synthetic analogues, initial encounter: Secondary | ICD-10-CM | POA: Diagnosis present

## 2018-10-13 DIAGNOSIS — Z66 Do not resuscitate: Secondary | ICD-10-CM | POA: Diagnosis present

## 2018-10-13 DIAGNOSIS — I252 Old myocardial infarction: Secondary | ICD-10-CM

## 2018-10-13 DIAGNOSIS — R072 Precordial pain: Secondary | ICD-10-CM

## 2018-10-13 DIAGNOSIS — I25118 Atherosclerotic heart disease of native coronary artery with other forms of angina pectoris: Secondary | ICD-10-CM | POA: Diagnosis not present

## 2018-10-13 DIAGNOSIS — Z8249 Family history of ischemic heart disease and other diseases of the circulatory system: Secondary | ICD-10-CM

## 2018-10-13 DIAGNOSIS — Z79899 Other long term (current) drug therapy: Secondary | ICD-10-CM

## 2018-10-13 DIAGNOSIS — K219 Gastro-esophageal reflux disease without esophagitis: Secondary | ICD-10-CM | POA: Diagnosis present

## 2018-10-13 DIAGNOSIS — E039 Hypothyroidism, unspecified: Secondary | ICD-10-CM | POA: Diagnosis present

## 2018-10-13 DIAGNOSIS — E860 Dehydration: Secondary | ICD-10-CM | POA: Diagnosis present

## 2018-10-13 DIAGNOSIS — I2510 Atherosclerotic heart disease of native coronary artery without angina pectoris: Secondary | ICD-10-CM | POA: Diagnosis present

## 2018-10-13 DIAGNOSIS — Z8673 Personal history of transient ischemic attack (TIA), and cerebral infarction without residual deficits: Secondary | ICD-10-CM

## 2018-10-13 DIAGNOSIS — Z7902 Long term (current) use of antithrombotics/antiplatelets: Secondary | ICD-10-CM

## 2018-10-13 DIAGNOSIS — Z7952 Long term (current) use of systemic steroids: Secondary | ICD-10-CM

## 2018-10-13 DIAGNOSIS — E785 Hyperlipidemia, unspecified: Secondary | ICD-10-CM | POA: Diagnosis present

## 2018-10-13 DIAGNOSIS — I5032 Chronic diastolic (congestive) heart failure: Secondary | ICD-10-CM | POA: Diagnosis present

## 2018-10-13 DIAGNOSIS — M353 Polymyalgia rheumatica: Secondary | ICD-10-CM | POA: Diagnosis present

## 2018-10-13 DIAGNOSIS — D72829 Elevated white blood cell count, unspecified: Secondary | ICD-10-CM | POA: Diagnosis present

## 2018-10-13 DIAGNOSIS — G4733 Obstructive sleep apnea (adult) (pediatric): Secondary | ICD-10-CM | POA: Diagnosis present

## 2018-10-13 DIAGNOSIS — N183 Chronic kidney disease, stage 3 unspecified: Secondary | ICD-10-CM | POA: Diagnosis present

## 2018-10-13 DIAGNOSIS — R079 Chest pain, unspecified: Secondary | ICD-10-CM | POA: Diagnosis present

## 2018-10-13 DIAGNOSIS — Z7951 Long term (current) use of inhaled steroids: Secondary | ICD-10-CM

## 2018-10-13 DIAGNOSIS — Z20828 Contact with and (suspected) exposure to other viral communicable diseases: Secondary | ICD-10-CM | POA: Diagnosis present

## 2018-10-13 DIAGNOSIS — Z6841 Body Mass Index (BMI) 40.0 and over, adult: Secondary | ICD-10-CM

## 2018-10-13 DIAGNOSIS — Z79890 Hormone replacement therapy (postmenopausal): Secondary | ICD-10-CM

## 2018-10-13 DIAGNOSIS — N179 Acute kidney failure, unspecified: Secondary | ICD-10-CM | POA: Diagnosis present

## 2018-10-13 DIAGNOSIS — I10 Essential (primary) hypertension: Secondary | ICD-10-CM | POA: Diagnosis present

## 2018-10-13 DIAGNOSIS — I5030 Unspecified diastolic (congestive) heart failure: Secondary | ICD-10-CM | POA: Diagnosis present

## 2018-10-13 DIAGNOSIS — I130 Hypertensive heart and chronic kidney disease with heart failure and stage 1 through stage 4 chronic kidney disease, or unspecified chronic kidney disease: Secondary | ICD-10-CM | POA: Diagnosis present

## 2018-10-13 LAB — CBC WITH DIFFERENTIAL/PLATELET
Abs Immature Granulocytes: 0.05 10*3/uL (ref 0.00–0.07)
Basophils Absolute: 0 10*3/uL (ref 0.0–0.1)
Basophils Relative: 0 %
Eosinophils Absolute: 0.3 10*3/uL (ref 0.0–0.5)
Eosinophils Relative: 3 %
HCT: 35.8 % — ABNORMAL LOW (ref 36.0–46.0)
Hemoglobin: 11.3 g/dL — ABNORMAL LOW (ref 12.0–15.0)
Immature Granulocytes: 0 %
Lymphocytes Relative: 17 %
Lymphs Abs: 1.9 10*3/uL (ref 0.7–4.0)
MCH: 30.7 pg (ref 26.0–34.0)
MCHC: 31.6 g/dL (ref 30.0–36.0)
MCV: 97.3 fL (ref 80.0–100.0)
Monocytes Absolute: 0.8 10*3/uL (ref 0.1–1.0)
Monocytes Relative: 7 %
Neutro Abs: 8.3 10*3/uL — ABNORMAL HIGH (ref 1.7–7.7)
Neutrophils Relative %: 73 %
Platelets: 253 10*3/uL (ref 150–400)
RBC: 3.68 MIL/uL — ABNORMAL LOW (ref 3.87–5.11)
RDW: 14 % (ref 11.5–15.5)
WBC: 11.4 10*3/uL — ABNORMAL HIGH (ref 4.0–10.5)
nRBC: 0 % (ref 0.0–0.2)

## 2018-10-13 LAB — BASIC METABOLIC PANEL
Anion gap: 11 (ref 5–15)
BUN: 31 mg/dL — ABNORMAL HIGH (ref 8–23)
CO2: 21 mmol/L — ABNORMAL LOW (ref 22–32)
Calcium: 8.7 mg/dL — ABNORMAL LOW (ref 8.9–10.3)
Chloride: 106 mmol/L (ref 98–111)
Creatinine, Ser: 1.62 mg/dL — ABNORMAL HIGH (ref 0.44–1.00)
GFR calc Af Amer: 35 mL/min — ABNORMAL LOW (ref >60)
GFR calc non Af Amer: 30 mL/min — ABNORMAL LOW (ref >60)
Glucose, Bld: 98 mg/dL (ref 70–99)
Potassium: 4.1 mmol/L (ref 3.5–5.1)
Sodium: 138 mmol/L (ref 135–145)

## 2018-10-13 LAB — TROPONIN I (HIGH SENSITIVITY)
Troponin I (High Sensitivity): 5 ng/L (ref <18)
Troponin I (High Sensitivity): 7 ng/L (ref <18)

## 2018-10-13 MED ORDER — ASPIRIN 81 MG PO CHEW
324.0000 mg | CHEWABLE_TABLET | Freq: Once | ORAL | Status: AC
  Administered 2018-10-13: 324 mg via ORAL
  Filled 2018-10-13: qty 4

## 2018-10-13 NOTE — ED Triage Notes (Signed)
Pt arrived via [place] from [place] at [place]. Advised of left arm pain and exertional dyspnea after moving boxes in her residence. Symptoms started yesterday.

## 2018-10-13 NOTE — ED Provider Notes (Signed)
[facility]PART[facility] Provider Note   CSN: [id number] Arrival date & time: 10/13/18  2009    History   Chief Complaint Chief Complaint  Patient presents with  . Shortness of Breath  . Arm Pain    HPI Erin Ingram is a 78 y.o. female.     HPI   Patient is a 78 year old female with a history of CHF, CKD stage III, CAD s/p CABG, hyperlipidemia, who presents to the emergency department today complaining of left upper chest and arm pain.  States that she was walking today when she experienced a grabbing/squeezing pain to the left upper chest.  It radiated to her jaw her arm and her left upper back.  This occurred around 1:00 PM today.  She rates pain 6-7/10.  She states that since then the pain has occurred intermittently and occurs every time she walks.  The pain usually improves when she sits down.  She has had the pain at rest today sometimes.  She reports chronic dyspnea on exertion that she states is baseline today.  She had no associated diaphoresis, lightheadedness, dizziness, nausea or vomiting.  She states that her symptoms were unchanged with movement of the left upper extremity.  She states that in the past when she has had similar pain it has been related to her heart.  She notes that she did have some pain to her left upper extremity yesterday after moving some boxes and papers but it felt different than the arm pain she experienced today.  She wonders whether her pain could be related to the stress of moving.  Past Medical History:  Diagnosis Date  . Anemia   . Arthritis    "fingers, back, shoulders, hips" (04/10/2016)  . Chronic diastolic CHF (congestive heart failure) (HCC)    a. normal EF, LVEDP at [facility] in 6/17 33 >> Lasix started   . CKD (chronic kidney disease), stage III ([place])   . Coronary artery disease    a. s/p CABG 2000 (SVG/ free LIMA Y graft to the diagonal and distal LAD, SVG to the OM 1, and SVG to the PDA) . b. Cath 12/19/2014  90% dSVG to RCA s/p 2 overlapping DES. c. LHC 2016, 2017 no intervention except diuresis needed. d. Low risk nuc 03/2016. e. Cath 12/2017 patent LIMA-LAD, SVG-OM, SVG-PDA but occluded SVG to diag.   . Depression    with anxious component  . GERD (gastroesophageal reflux disease)   . History of hiatal hernia   . Hyperlipidemia   . Hypothyroidism   . Obesity   . OSA on CPAP   . PMR (polymyalgia rheumatica) (HCC)   . Pneumonia    "2-3 times" (04/10/2016)  . Stable angina (HCC)    microvascular, improved with Ranexa  . Subclavian artery stenosis (HCC)    a. L by cath note in 2016.  Marland Kitchen TIA (transient ischemic attack)     Patient Active Problem List   Diagnosis Date Noted  . Chest pain, rule out acute myocardial infarction 10/14/2018  . Weight gain 08/18/2018  . Hypocalcemia 07/20/2018  . Candidiasis 07/20/2018  . Syncope 07/18/2018  . Kidney disease, chronic, stage III (moderate, EGFR 30-59 ml/min) ([place]) 06/10/2018  . Constipation 06/02/2018  . Allergic rhinitis 06/02/2018  . Osteoporosis 06/02/2018  . Tremor 06/15/2017  . Gait abnormality 06/15/2017  . Polymyalgia rheumatica ([place]) 06/11/2017  . Fall 06/11/2017  . Visual hallucination 06/10/2017  . GERD (gastroesophageal reflux disease) 06/10/2017  . Hypothyroidism 06/10/2017  .  Abnormal movement 06/10/2017  . Dysphagia 07/27/2016  . Irritable bowel syndrome with diarrhea 07/27/2016  . Unstable angina pectoris ([place]) 04/10/2016  . Bilateral lower extremity edema 03/13/2016  . (HFpEF) heart failure with preserved ejection fraction ([place]) 10/08/2015  . CAD (coronary artery disease) 10/08/2015  . Subclavian arterial stenosis ([place])   . Hyperlipidemia LDL goal <70   . PAD (peripheral artery disease) ([place]) 04/11/2015  . Essential hypertension 04/11/2014  . Morbid obesity ([place]) 03/22/2013  . Anemia, unspecified 03/22/2013  . Depression, recurrent ([place]) 03/22/2013  . Generalized osteoarthrosis, unspecified site 03/22/2013  . Sleep  apnea 03/22/2013  . Impaired fasting glucose 03/22/2013  . 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 Advanced care planning/counseling discussion 03/22/2013    Past Surgical History:  Procedure Laterality Date  . BREAST BIOPSY Right 1964  . CARDIAC CATHETERIZATION  08   patent grafts, no culprit lesions, EF 65%  . CARDIAC CATHETERIZATION N/A 12/19/2014   Procedure: Left Heart Cath and Cors/Grafts Angiography;  Surgeon: Jettie Booze, MD;  Location: [place] [facility];  Service: Cardiovascular;  Laterality: N/A;  . CARDIAC CATHETERIZATION N/A 12/19/2014   Procedure: Coronary Stent Intervention;  Surgeon: Jettie Booze, MD;  Location: [place] [facility];  Service: Cardiovascular;  Laterality: N/A;  . CARDIAC CATHETERIZATION N/A 01/01/2015   Procedure: Left Heart Cath and Cors/Grafts Angiography;  Surgeon: Jettie Booze, MD;  Location: [place] [facility];  Service: Cardiovascular;  Laterality: N/A;  . CARDIAC CATHETERIZATION N/A 09/20/2015   Procedure: Left Heart Cath and Cors/Grafts Angiography;  Surgeon: Jettie Booze, MD;  Location: [place] [facility];  Service: Cardiovascular;  Laterality: N/A;  . CATARACT EXTRACTION W/ INTRAOCULAR LENS  IMPLANT, BILATERAL Bilateral 2011  . COLONOSCOPY WITH PROPOFOL N/A 07/27/2016   Procedure: COLONOSCOPY WITH PROPOFOL;  Surgeon: Wilford Corner, MD;  Location: [place] [facility];  Service: Endoscopy;  Laterality: N/A;  . CORONARY ARTERY BYPASS GRAFT  2000   ASCVD, multivessel, S./P.  . DILATION AND CURETTAGE OF UTERUS  1980s  . ESOPHAGOGASTRODUODENOSCOPY (EGD) WITH PROPOFOL N/A 07/27/2016   Procedure: ESOPHAGOGASTRODUODENOSCOPY (EGD) WITH PROPOFOL;  Surgeon: Wilford Corner, MD;  Location: [facility];  Service: Endoscopy;  Laterality: N/A;  . FRACTURE SURGERY    . LEFT HEART CATH AND CORS/GRAFTS ANGIOGRAPHY N/A 01/12/2018   Procedure: LEFT HEART CATH AND CORS/GRAFTS ANGIOGRAPHY;  Surgeon: Jettie Booze, MD;  Location: [facility];  Service: Cardiovascular;  Laterality: N/A;  . PATELLA FRACTURE SURGERY Left 1993     OB History   No obstetric history on file.     Home Medications    Prior to Admission medications   Medication Sig Start Date End Date Taking? Authorizing Provider  alendronate (FOSAMAX) 70 MG tablet Take 70 mg by mouth once a week. Take with a full glass of water on an empty stomach.    [provider]  atorvastatin (LIPITOR) 10 MG tablet Take 10 mg by mouth daily.    [provider]  calcium carbonate (OS-CAL) 600 MG TABS tablet Take 600 mg by mouth 2 (two) times daily with a meal.    [provider]  Cholecalciferol (VITAMIN D PO) Take 2,000 Units by mouth daily.  07/20/18   [provider]  clopidogrel (PLAVIX) 75 MG tablet TAKE 1 TABLET ONCE DAILY. 06/16/18   Jettie Booze, MD  Coenzyme Q10 10 MG capsule Take 1 capsule (10 mg total) by mouth at bedtime. 10/07/18 11/06/18  Mast, Man X, NP  fluticasone (FLONASE)  50 MCG/ACT nasal spray Place 2 sprays into both nostrils daily as needed for allergies.     [provider]  Glucosamine-Chondroit-Vit C-Mn (GLUCOSAMINE 1500 COMPLEX) CAPS Take 2 capsules by mouth daily after breakfast.    [provider]  isosorbide mononitrate (IMDUR) 60 MG 24 hr tablet Take 2 tablets (120 mg total) by mouth daily. 03/21/18   Jettie Booze, MD  levocetirizine (XYZAL) 5 MG tablet TAKE 1 TABLET ONCE DAILY IN THE EVENING. 09/12/18   Mast, Man X, NP  levothyroxine (SYNTHROID) 75 MCG tablet TAKE 1 TABLET ONCE DAILY ON EMPTY STOMACH. 09/12/18   Mast, Man X, NP  metoprolol succinate (TOPROL-XL) 50 MG 24 hr tablet Take 1.5 tablets orally once a day. Take with or immediately following a meal. 09/19/18   Jettie Booze, MD  MYRBETRIQ 50 MG TB24 tablet TAKE 1 TABLET BY MOUTH DAILY. 09/26/18   Mast, Man X, NP  nitroGLYCERIN (NITROSTAT) 0.4 MG SL tablet Place 0.4 mg under the tongue every 5 (five) minutes as needed  for chest pain. X 3 doses 07/19/18   [provider]  nystatin (MYCOSTATIN/NYSTOP) powder Apply 100,000 g topically 2 (two) times daily. 10/07/18 11/06/18  Mast, Man X, NP  pantoprazole (PROTONIX) 40 MG tablet Take 1 tablet (40 mg total) by mouth daily. 09/02/18   Mast, Man X, NP  predniSONE (DELTASONE) 5 MG tablet Take 5-7.5 mg by mouth See admin instructions. Take 5 mg on Sun and Wednesday.Take 7.5 mg all other days, take With food or milk.    [provider]  ranolazine (RANEXA) 1000 MG SR tablet TAKE 1 TABLET BY MOUTH TWICE DAILY. 03/07/18   Jettie Booze, MD  venlafaxine XR (EFFEXOR-XR) 75 MG 24 hr capsule TAKE 3 CAPSULES ONCE A DAY. 09/12/18   Mast, Man X, NP    Family History Family History  Problem Relation Age of Onset  . Heart disease Mother   . Heart attack Mother   . Heart disease Father   . Heart attack Father   . Diabetes Brother   . Pulmonary embolism Brother   . Stroke Paternal Grandmother   . Hypertension Neg Hx     Social History Social History   Tobacco Use  . Smoking status: Former Smoker    Packs/day: 0.50    Years: 7.00    Pack years: 3.50    Types: Cigarettes    Quit date: 08/21/1969    Years since quitting: 49.1  . Smokeless tobacco: Never Used  Substance Use Topics  . Alcohol use: No    Alcohol/week: 0.0 standard drinks    Frequency: Never    Comment: rare  . Drug use: No     Allergies   Nsaids, Butorphanol, Sulfa antibiotics, Bisoprolol fumarate, Butorphanol tartrate, Codeine, Demerol [meperidine], Imipramine, Meperidine and related, Statins, Tequin [gatifloxacin], Brilinta [ticagrelor], Pseudoephedrine hcl, Septra [sulfamethoxazole-trimethoprim], and Sulfamethoxazole-trimethoprim   Review of Systems Review of Systems  Constitutional: Negative for chills and fever.  HENT: Negative for ear pain and sore throat.   Eyes: Negative for pain and visual disturbance.  Respiratory: Positive for shortness of breath (with exertion,  chronic). Negative for cough.   Cardiovascular: Positive for chest pain.  Gastrointestinal: Negative for abdominal pain, constipation, diarrhea, nausea and vomiting.  Genitourinary: Negative for dysuria and hematuria.  Musculoskeletal:       Left arm pain, left back pain, jaw pain  Skin: Negative for rash.  Neurological: Negative for dizziness, weakness, light-headedness, numbness and headaches.  All other systems  reviewed and are negative.    Physical Exam Updated Vital Signs BP 123/61   Pulse 71   Temp 98.8 F (37.1 C) (Oral)   Resp 15   Ht _0  (1.473 m)   Wt 89.8 kg   SpO2 97%   BMI 41.38 kg/m   Physical Exam Vitals signs and nursing note reviewed.  Constitutional:      General: She is not in acute distress.    Appearance: She is well-developed. She is not ill-appearing or toxic-appearing.  HENT:     Head: Normocephalic and atraumatic.  Eyes:     Conjunctiva/sclera: Conjunctivae normal.  Neck:     Musculoskeletal: Neck supple.  Cardiovascular:     Rate and Rhythm: Normal rate and regular rhythm.     Heart sounds: No murmur.  Pulmonary:     Effort: Pulmonary effort is normal. No respiratory distress.     Breath sounds: Normal breath sounds. No decreased breath sounds, wheezing, rhonchi or rales.     Comments: No chest wall tenderness or skin changes Abdominal:     Palpations: Abdomen is soft.     Tenderness: There is no abdominal tenderness.  Musculoskeletal:     Comments: Trace ble, no calf ttp  Skin:    General: Skin is warm and dry.  Neurological:     Mental Status: She is alert.      ED Treatments / Results  Labs (all labs ordered are listed, but only abnormal results are displayed) Labs Reviewed  CBC WITH DIFFERENTIAL/PLATELET - Abnormal; Notable for the following components:      Result Value   WBC 11.4 (*)    RBC 3.68 (*)    Hemoglobin 11.3 (*)    HCT 35.8 (*)    Neutro Abs 8.3 (*)    All other components within normal limits  BASIC  METABOLIC PANEL - Abnormal; Notable for the following components:   CO2 21 (*)    BUN 31 (*)    Creatinine, Ser 1.62 (*)    Calcium 8.7 (*)    GFR calc non Af Amer 30 (*)    GFR calc Af Amer 35 (*)    All other components within normal limits  BRAIN NATRIURETIC PEPTIDE - Abnormal; Notable for the following components:   B Natriuretic Peptide 318.5 (*)    All other components within normal limits  SARS CORONAVIRUS 2 (HOSPITAL ORDER, [facility])  BASIC METABOLIC PANEL  TROPONIN I (HIGH SENSITIVITY)  TROPONIN I (HIGH SENSITIVITY)  TROPONIN I (HIGH SENSITIVITY)    EKG None  Radiology Dg Chest 2 View  Result Date: 10/13/2018 CLINICAL DATA:  Acute onset of shortness of breath and left arm pain. EXAM: CHEST - 2 VIEW COMPARISON:  Chest radiograph performed 07/17/2018 FINDINGS: The lungs are well-aerated. Mild vascular congestion is noted. There is no evidence of focal opacification, pleural effusion or pneumothorax. The heart is mildly enlarged. The patient is status post median sternotomy, with evidence of prior CABG. No acute osseous abnormalities are seen. IMPRESSION: Mild vascular congestion and mild cardiomegaly. Lungs remain grossly clear. Electronically Signed   By: Garald Balding M.D.   On: 10/13/2018 22:22    Procedures Procedures (including critical care time)  Medications Ordered in ED Medications  acetaminophen (TYLENOL) tablet 650 mg (has no administration in time range)  ondansetron (ZOFRAN) injection 4 mg (has no administration in time range)  enoxaparin (LOVENOX) injection 40 mg (has no administration in time range)  aspirin chewable tablet  324 mg (324 mg Oral Given 10/13/18 2157)     Initial Impression / Assessment and Plan / ED Course  I have reviewed the triage vital signs and the nursing notes.  Pertinent labs & imaging results that were available during my care of the patient were reviewed by me and considered in my medical decision  making (see chart for details).    Final Clinical Impressions(s) / ED Diagnoses   Final diagnoses:  None   78 year old female with significant cardiac history presenting for evaluation of left upper chest pain starting today while she was ambulating.  Pain radiates to the jaw, left arm and intermittently to the left upper back.  Has been occurring intermittently all day long, most commonly when she is exerting herself.  Symptoms do sound typical in nature.  Labs show a leukocytosis at 11.4, likely related to chronic steroid use.  Hemoglobin slightly low at 11.3, improved from prior. BMP with slightly low bicarb at 21, elevated BUN/creatinine at 31 and 1.62.  AKI on CKD.  Last creatinine 1.2. Initial troponin is 5.  Delta troponin is negative.  EKG is nonischemic, consistent with prior  CXR with mild vascular congestion and mild cardiomegaly. Lungs remain grossly clear.  Given patients significant h/o CAD and c/o exertional cp with typical sxs, will plan for admission for cycling of her troponins.  Dr. Alcario Drought with hospitalist service accepts pt for admission.    ED Discharge Orders    None       Rodney Booze, [place] 10/14/18 0041    Charlesetta Shanks, MD 10/18/18 1006

## 2018-10-14 ENCOUNTER — Other Ambulatory Visit: Payer: Self-pay

## 2018-10-14 DIAGNOSIS — Z6841 Body Mass Index (BMI) 40.0 and over, adult: Secondary | ICD-10-CM | POA: Diagnosis not present

## 2018-10-14 DIAGNOSIS — I5032 Chronic diastolic (congestive) heart failure: Secondary | ICD-10-CM | POA: Diagnosis present

## 2018-10-14 DIAGNOSIS — Z66 Do not resuscitate: Secondary | ICD-10-CM | POA: Diagnosis present

## 2018-10-14 DIAGNOSIS — Z8673 Personal history of transient ischemic attack (TIA), and cerebral infarction without residual deficits: Secondary | ICD-10-CM | POA: Diagnosis not present

## 2018-10-14 DIAGNOSIS — Z7902 Long term (current) use of antithrombotics/antiplatelets: Secondary | ICD-10-CM | POA: Diagnosis not present

## 2018-10-14 DIAGNOSIS — G4733 Obstructive sleep apnea (adult) (pediatric): Secondary | ICD-10-CM | POA: Diagnosis present

## 2018-10-14 DIAGNOSIS — M353 Polymyalgia rheumatica: Secondary | ICD-10-CM | POA: Diagnosis present

## 2018-10-14 DIAGNOSIS — E039 Hypothyroidism, unspecified: Secondary | ICD-10-CM | POA: Diagnosis present

## 2018-10-14 DIAGNOSIS — I25118 Atherosclerotic heart disease of native coronary artery with other forms of angina pectoris: Principal | ICD-10-CM

## 2018-10-14 DIAGNOSIS — Z7952 Long term (current) use of systemic steroids: Secondary | ICD-10-CM | POA: Diagnosis not present

## 2018-10-14 DIAGNOSIS — I25110 Atherosclerotic heart disease of native coronary artery with unstable angina pectoris: Secondary | ICD-10-CM | POA: Diagnosis not present

## 2018-10-14 DIAGNOSIS — R079 Chest pain, unspecified: Secondary | ICD-10-CM | POA: Diagnosis not present

## 2018-10-14 DIAGNOSIS — N183 Chronic kidney disease, stage 3 (moderate): Secondary | ICD-10-CM | POA: Diagnosis present

## 2018-10-14 DIAGNOSIS — N179 Acute kidney failure, unspecified: Secondary | ICD-10-CM | POA: Diagnosis present

## 2018-10-14 DIAGNOSIS — Z87891 Personal history of nicotine dependence: Secondary | ICD-10-CM | POA: Diagnosis not present

## 2018-10-14 DIAGNOSIS — T380X5A Adverse effect of glucocorticoids and synthetic analogues, initial encounter: Secondary | ICD-10-CM | POA: Diagnosis present

## 2018-10-14 DIAGNOSIS — Z20828 Contact with and (suspected) exposure to other viral communicable diseases: Secondary | ICD-10-CM | POA: Diagnosis present

## 2018-10-14 DIAGNOSIS — E785 Hyperlipidemia, unspecified: Secondary | ICD-10-CM | POA: Diagnosis present

## 2018-10-14 DIAGNOSIS — Z79899 Other long term (current) drug therapy: Secondary | ICD-10-CM | POA: Diagnosis not present

## 2018-10-14 DIAGNOSIS — I10 Essential (primary) hypertension: Secondary | ICD-10-CM

## 2018-10-14 DIAGNOSIS — E860 Dehydration: Secondary | ICD-10-CM | POA: Diagnosis present

## 2018-10-14 DIAGNOSIS — K219 Gastro-esophageal reflux disease without esophagitis: Secondary | ICD-10-CM | POA: Diagnosis present

## 2018-10-14 DIAGNOSIS — Z7951 Long term (current) use of inhaled steroids: Secondary | ICD-10-CM | POA: Diagnosis not present

## 2018-10-14 DIAGNOSIS — Z79890 Hormone replacement therapy (postmenopausal): Secondary | ICD-10-CM | POA: Diagnosis not present

## 2018-10-14 DIAGNOSIS — I130 Hypertensive heart and chronic kidney disease with heart failure and stage 1 through stage 4 chronic kidney disease, or unspecified chronic kidney disease: Secondary | ICD-10-CM | POA: Diagnosis present

## 2018-10-14 DIAGNOSIS — Z8249 Family history of ischemic heart disease and other diseases of the circulatory system: Secondary | ICD-10-CM | POA: Diagnosis not present

## 2018-10-14 LAB — BASIC METABOLIC PANEL
Anion gap: 11 (ref 5–15)
BUN: 28 mg/dL — ABNORMAL HIGH (ref 8–23)
CO2: 19 mmol/L — ABNORMAL LOW (ref 22–32)
Calcium: 8.6 mg/dL — ABNORMAL LOW (ref 8.9–10.3)
Chloride: 110 mmol/L (ref 98–111)
Creatinine, Ser: 1.4 mg/dL — ABNORMAL HIGH (ref 0.44–1.00)
GFR calc Af Amer: 41 mL/min — ABNORMAL LOW (ref >60)
GFR calc non Af Amer: 36 mL/min — ABNORMAL LOW (ref >60)
Glucose, Bld: 84 mg/dL (ref 70–99)
Potassium: 4.2 mmol/L (ref 3.5–5.1)
Sodium: 140 mmol/L (ref 135–145)

## 2018-10-14 LAB — TROPONIN I (HIGH SENSITIVITY): Troponin I (High Sensitivity): 6 ng/L (ref <18)

## 2018-10-14 LAB — MRSA PCR SCREENING: MRSA by PCR: NEGATIVE

## 2018-10-14 LAB — BRAIN NATRIURETIC PEPTIDE: B Natriuretic Peptide: 318.5 pg/mL — ABNORMAL HIGH (ref 0.0–100.0)

## 2018-10-14 LAB — SARS CORONAVIRUS 2 BY RT PCR (HOSPITAL ORDER, PERFORMED IN ~~LOC~~ HOSPITAL LAB): SARS Coronavirus 2: NEGATIVE

## 2018-10-14 MED ORDER — FLUTICASONE PROPIONATE 50 MCG/ACT NA SUSP: 2.0000 | Freq: Every day | NASAL | Status: DC | PRN

## 2018-10-14 MED ORDER — ENOXAPARIN SODIUM 30 MG/0.3ML ~~LOC~~ SOLN
30.0000 mg | SUBCUTANEOUS | Status: DC
  Administered 2018-10-14 – 2018-10-15 (×2): 30 mg via SUBCUTANEOUS
  Filled 2018-10-14 (×2): qty 0.3

## 2018-10-14 MED ORDER — ISOSORBIDE MONONITRATE ER 60 MG PO TB24
120.0000 mg | ORAL_TABLET | Freq: Every day | ORAL | Status: DC
  Administered 2018-10-14 – 2018-10-15 (×2): 120 mg via ORAL
  Filled 2018-10-14 (×2): qty 2

## 2018-10-14 MED ORDER — LORATADINE 10 MG PO TABS
10.0000 mg | ORAL_TABLET | Freq: Every day | ORAL | Status: DC
  Administered 2018-10-14 (×2): 10 mg via ORAL
  Filled 2018-10-14 (×2): qty 1

## 2018-10-14 MED ORDER — PREDNISONE 5 MG PO TABS
7.5000 mg | ORAL_TABLET | ORAL | Status: DC
  Filled 2018-10-14: qty 1.5

## 2018-10-14 MED ORDER — PREDNISONE 5 MG PO TABS
5.0000 mg | ORAL_TABLET | ORAL | Status: DC
  Filled 2018-10-14: qty 1

## 2018-10-14 MED ORDER — NYSTATIN 100000 UNIT/GM EX POWD
100000.0000 g | Freq: Two times a day (BID) | CUTANEOUS | Status: DC
  Administered 2018-10-14 – 2018-10-15 (×3): 100000 g via TOPICAL
  Filled 2018-10-14: qty 15

## 2018-10-14 MED ORDER — LOPERAMIDE HCL 2 MG PO CAPS
2.0000 mg | ORAL_CAPSULE | ORAL | Status: DC | PRN
  Administered 2018-10-14: 2 mg via ORAL
  Filled 2018-10-14: qty 1

## 2018-10-14 MED ORDER — ACETAMINOPHEN 325 MG PO TABS
650.0000 mg | ORAL_TABLET | ORAL | Status: DC | PRN
  Administered 2018-10-14: 650 mg via ORAL
  Filled 2018-10-14: qty 2

## 2018-10-14 MED ORDER — ATORVASTATIN CALCIUM 10 MG PO TABS
10.0000 mg | ORAL_TABLET | Freq: Every day | ORAL | Status: DC
  Administered 2018-10-14 – 2018-10-15 (×2): 10 mg via ORAL
  Filled 2018-10-14 (×2): qty 1

## 2018-10-14 MED ORDER — RANOLAZINE ER 500 MG PO TB12
1000.0000 mg | ORAL_TABLET | Freq: Two times a day (BID) | ORAL | Status: DC
  Administered 2018-10-14 – 2018-10-15 (×3): 1000 mg via ORAL
  Filled 2018-10-14 (×3): qty 2

## 2018-10-14 MED ORDER — VENLAFAXINE HCL ER 75 MG PO CP24
225.0000 mg | ORAL_CAPSULE | Freq: Every day | ORAL | Status: DC
  Administered 2018-10-14 – 2018-10-15 (×2): 225 mg via ORAL
  Filled 2018-10-14 (×4): qty 1

## 2018-10-14 MED ORDER — PANTOPRAZOLE SODIUM 40 MG PO TBEC
40.0000 mg | DELAYED_RELEASE_TABLET | Freq: Every day | ORAL | Status: DC
  Administered 2018-10-14 – 2018-10-15 (×2): 40 mg via ORAL
  Filled 2018-10-14 (×2): qty 1

## 2018-10-14 MED ORDER — ONDANSETRON HCL 4 MG/2ML IJ SOLN: 4.0000 mg | Freq: Four times a day (QID) | INTRAMUSCULAR | Status: DC | PRN

## 2018-10-14 MED ORDER — MIRABEGRON ER 50 MG PO TB24
50.0000 mg | ORAL_TABLET | Freq: Every day | ORAL | Status: DC
  Administered 2018-10-14 – 2018-10-15 (×2): 50 mg via ORAL
  Filled 2018-10-14 (×2): qty 1

## 2018-10-14 MED ORDER — VITAMIN D 25 MCG (1000 UNIT) PO TABS
2000.0000 [IU] | ORAL_TABLET | Freq: Every day | ORAL | Status: DC
  Administered 2018-10-14 – 2018-10-15 (×2): 2000 [IU] via ORAL
  Filled 2018-10-14 (×2): qty 2

## 2018-10-14 MED ORDER — CALCIUM CARBONATE 1250 (500 CA) MG PO TABS
625.0000 mg | ORAL_TABLET | Freq: Two times a day (BID) | ORAL | Status: DC
  Administered 2018-10-14 – 2018-10-15 (×4): 625 mg via ORAL
  Filled 2018-10-14 (×4): qty 1

## 2018-10-14 MED ORDER — METOPROLOL SUCCINATE ER 50 MG PO TB24
75.0000 mg | ORAL_TABLET | Freq: Every day | ORAL | Status: DC
  Administered 2018-10-14 – 2018-10-15 (×2): 75 mg via ORAL
  Filled 2018-10-14 (×2): qty 1

## 2018-10-14 MED ORDER — PREDNISOLONE 5 MG PO TABS
7.5000 mg | ORAL_TABLET | ORAL | Status: DC
  Administered 2018-10-15: 7.5 mg via ORAL
  Filled 2018-10-14: qty 2

## 2018-10-14 MED ORDER — LEVOTHYROXINE SODIUM 75 MCG PO TABS
75.0000 ug | ORAL_TABLET | Freq: Every day | ORAL | Status: DC
  Administered 2018-10-14 – 2018-10-15 (×2): 75 ug via ORAL
  Filled 2018-10-14 (×2): qty 1

## 2018-10-14 MED ORDER — CLOPIDOGREL BISULFATE 75 MG PO TABS
75.0000 mg | ORAL_TABLET | Freq: Every day | ORAL | Status: DC
  Administered 2018-10-14 – 2018-10-15 (×2): 75 mg via ORAL
  Filled 2018-10-14 (×2): qty 1

## 2018-10-14 MED FILL — Nystatin Topical Powder 100000 Unit/GM: CUTANEOUS | Qty: 15 | Status: AC

## 2018-10-14 NOTE — ED Notes (Signed)
Provided update to Miquel Dunn, Therapist, sports at assisted living facility.

## 2018-10-14 NOTE — Consult Note (Addendum)
Cardiology Consultation:   Patient ID: Erin Ingram MRN: [id number]; DOB: [birth year]  Admit date: 10/13/2018 Date of Consult: 10/14/2018  Primary Care Provider: Mast, Man X, NP Primary Cardiologist: Erin Grooms, MD  Primary Electrophysiologist:  None    Patient Profile:   Erin Ingram is a 78 y.o. female with a hx of CAD s/p CABG 2000 and 2 DES to SVG-RCA 11/2014, CKD stage 3, polymyalgia rheumatica on steroids, hypothyroidism, chronic diastolic CHF, OSA on CPAP, HTN, hyperlipidemia who is being seen today for the evaluation of stable angina at the request of Dr. Manuella Ingram.  History of Present Illness:   Cardiac history includes in 2000 a SVG/free LIMA Y graft to the diagonal and distal LAD, SVG to OM1 and SVG to PDA. She underwent 2 DES to SVG to RCA in September 2016. Cath also showed L subclavian stenosis at that time. Follow-up carotid duplex showed no significant findings. Cath in 2017 showed stable anatomy with occluded SVG-diag and she had a low risk myoview back in 2018(cannot see in chart). 01/11/2018 she presented to the ER for chest pain and was admitted. HS trop was negative Ingram 3. Cardiac cath showed patent LIMA-LAD, SVG-OM, SVG-PDA but occluded SVG to diag. Normal LVEDP and EF noted at 55-65%. Medical management was pursued at that time. ASA was stopped due to easy bruising.   Last LHC in 12/2017 showed prox LAD to mid LAD 25% stenosed, ostcx to dist cx lesion 90% stenosed, svg to OM widely patent, mid RCA lesions 90% stenosed. SVG to PDA is patent, patent stents in distal graft. SVG to diagonal is occluded, mid LAD lesion 90% stenosed, LIMA to LAD is patent, left ventricular sytolic function was normal, LVED pressure was 10.   07/18/18 patient was hospitalized for syncope. HTN and diuretics were held. Orthostatics were negative. Echo showed LVEF 55-60%, with some moderate diastolic dysfunction. Patient reports amlodipine were discontinued at discharge as well as metoprolol  was decreased.   Patient had a televisit with Dr. Irish Ingram 09/19/18 for regular follow-up. Patient was continued on Plavix.. At the time there were no signs of volume overload but it was noted patient has had some issues with compliance regarding low sodium diet in the past.  B/P was stable. Patient had been losing weight  Ms. Erin Ingram presented to the ED 7/23 for left shoulder/arm pain and mild chest discomfort. Pain started at 1 pm while walking yesterday. Pain is located in the left shoulder and squeezing in nature would occasionally radiate down the arm. She would rate the pain 6-7/10. Pain has intermittently occurred every time she walks but subsides at rest. Patient had associated SOB however, she admits to having chronic DOE at baseline. Denied diaphoresis, dizziness, N/V. She stated this pain feels similar to when she had heart-related pain. Patient took a Nitrogen without relief.  Patient states she did have Left upper extremity pain the day before being that her and her husband are currently in the middle of moving. The day before she was packing and moving boxes and felt some left shoulder pain, but says that pain was different.   In the ED HS trop 5>>7. EKG showed NSR, HR 72, No ST/T wave changes. B/P 123/61. Potassium 4.2, sodium 140, Creatinine 1.62, WBC 11.4, HGB 11.3. Leukocytosis was thought to be due to chronic steroid use. CXR showed mild vascular congestion and mild cardiomegaly with no acute process. Patient received ASA 325 in the ED. Patient was admitted for further cardiac work-up.   Heart  Pathway Score:     Past Medical History:  Diagnosis Date   Anemia    Arthritis    "fingers, back, shoulders, hips" (04/10/2016)   Chronic diastolic CHF (congestive heart failure) (HCC)    a. normal EF, LVEDP at [facility] in 6/17 33 >> Lasix started    CKD (chronic kidney disease), stage III (HCC)    Coronary artery disease    a. s/p CABG 2000 (SVG/ free LIMA Y graft to the diagonal and distal  LAD, SVG to the OM 1, and SVG to the PDA) . b. Cath 12/19/2014 90% dSVG to RCA s/p 2 overlapping DES. c. LHC 2016, 2017 no intervention except diuresis needed. d. Low risk nuc 03/2016. e. Cath 12/2017 patent LIMA-LAD, SVG-OM, SVG-PDA but occluded SVG to diag.    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 L by cath note in 2016.   TIA (transient ischemic attack)     Past Surgical History:  Procedure Laterality Date   BREAST BIOPSY Right 1964   CARDIAC CATHETERIZATION  08   patent grafts, no culprit lesions, EF 65%   CARDIAC CATHETERIZATION N/A 12/19/2014   Procedure: Left Heart Cath and Cors/Grafts Angiography;  Surgeon: Jettie Booze, MD;  Location: [facility];  Service: Cardiovascular;  Laterality: N/A;   CARDIAC CATHETERIZATION N/A 12/19/2014   Procedure: Coronary Stent Intervention;  Surgeon: Jettie Booze, MD;  Location: [facility];  Service: Cardiovascular;  Laterality: N/A;   CARDIAC CATHETERIZATION N/A 01/01/2015   Procedure: Left Heart Cath and Cors/Grafts Angiography;  Surgeon: Jettie Booze, MD;  Location: [facility];  Service: Cardiovascular;  Laterality: N/A;   CARDIAC CATHETERIZATION N/A 09/20/2015   Procedure: Left Heart Cath and Cors/Grafts Angiography;  Surgeon: Jettie Booze, MD;  Location: [facility];  Service: Cardiovascular;  Laterality: N/A;   CATARACT EXTRACTION W/ INTRAOCULAR LENS  IMPLANT, BILATERAL Bilateral 2011   COLONOSCOPY WITH PROPOFOL N/A 07/27/2016   Procedure: COLONOSCOPY WITH PROPOFOL;  Surgeon: Wilford Corner, MD;  Location: [facility];  Service: Endoscopy;  Laterality: N/A;    CORONARY ARTERY BYPASS GRAFT  2000   ASCVD, multivessel, S./P.   DILATION AND CURETTAGE OF UTERUS  1980s   ESOPHAGOGASTRODUODENOSCOPY (EGD) WITH PROPOFOL N/A 07/27/2016   Procedure: ESOPHAGOGASTRODUODENOSCOPY (EGD) WITH PROPOFOL;  Surgeon: Wilford Corner, MD;  Location: [facility];  Service: Endoscopy;  Laterality: N/A;   FRACTURE SURGERY     LEFT HEART CATH AND CORS/GRAFTS ANGIOGRAPHY N/A 01/12/2018   Procedure: LEFT HEART CATH AND CORS/GRAFTS ANGIOGRAPHY;  Surgeon: Jettie Booze, MD;  Location: [facility];  Service: Cardiovascular;  Laterality: N/A;   PATELLA FRACTURE SURGERY Left 1993     Home Medications:  Prior to Admission medications   Medication Sig Start Date End Date Taking? Authorizing Provider  alendronate (FOSAMAX) 70 MG tablet Take 70 mg by mouth every Sunday.    Yes [provider]  atorvastatin (LIPITOR) 10 MG tablet Take 10 mg by mouth daily.   Yes [provider]  calcium carbonate (OS-CAL) 600 MG TABS tablet Take 600  mg by mouth 2 (two) times daily with a meal.   Yes [provider]  Cholecalciferol (VITAMIN D) 50 MCG (2000 UT) tablet Take 2,000 Units by mouth daily.   Yes [provider]  clopidogrel (PLAVIX) 75 MG tablet TAKE 1 TABLET ONCE DAILY. Patient taking differently: Take 75 mg by mouth daily.  06/16/18  Yes Jettie Booze, MD  Coenzyme Q10 10 MG capsule Take 1 capsule (10 mg total) by mouth at bedtime. 10/07/18 11/06/18 Yes Mast, Man X, NP  fluticasone (FLONASE) 50 MCG/ACT nasal spray Place 2 sprays into both nostrils daily as needed for allergies.    Yes [provider]  Glucosamine-Chondroit-Vit C-Mn (GLUCOSAMINE 1500 COMPLEX) CAPS Take 2 capsules by mouth daily after breakfast.   Yes [provider]  isosorbide mononitrate (IMDUR) 60 MG 24 hr tablet Take 2 tablets (120 mg total) by mouth daily. 03/21/18  Yes Jettie Booze, MD  levocetirizine (XYZAL) 5 MG tablet TAKE 1  TABLET ONCE DAILY IN THE EVENING. Patient taking differently: Take 5 mg by mouth every evening.  09/12/18  Yes Mast, Man X, NP  levothyroxine (SYNTHROID) 75 MCG tablet TAKE 1 TABLET ONCE DAILY ON EMPTY STOMACH. Patient taking differently: Take 75 mcg by mouth daily before breakfast.  09/12/18  Yes Mast, Man X, NP  metoprolol succinate (TOPROL-XL) 50 MG 24 hr tablet Take 1.5 tablets orally once a day. Take with or immediately following a meal. Patient taking differently: Take 75 mg by mouth daily.  09/19/18  Yes Jettie Booze, MD  MYRBETRIQ 50 MG TB24 tablet TAKE 1 TABLET BY MOUTH DAILY. Patient taking differently: Take 50 mg by mouth daily.  09/26/18  Yes Mast, Man X, NP  nitroGLYCERIN (NITROSTAT) 0.4 MG SL tablet Place 0.4 mg under the tongue every 5 (five) minutes as needed for chest pain. Ingram 3 doses 07/19/18  Yes [provider]  nystatin (MYCOSTATIN/NYSTOP) powder Apply 100,000 g topically 2 (two) times daily. 10/07/18 11/06/18 Yes Mast, Man X, NP  pantoprazole (PROTONIX) 40 MG tablet Take 1 tablet (40 mg total) by mouth daily. 09/02/18  Yes Mast, Man X, NP  predniSONE (DELTASONE) 5 MG tablet Take 5-7.5 mg by mouth See admin instructions. Take 1 tablet on Sunday and Wednesday then Take 1 and 1/2 tablets  all other days   Yes [provider]  ranolazine (RANEXA) 1000 MG SR tablet TAKE 1 TABLET BY MOUTH TWICE DAILY. Patient taking differently: Take 1,000 mg by mouth 2 (two) times daily.  03/07/18  Yes Jettie Booze, MD  venlafaxine XR (EFFEXOR-XR) 75 MG 24 hr capsule TAKE 3 CAPSULES ONCE A DAY. Patient taking differently: Take 225 mg by mouth daily with breakfast.  09/12/18  Yes Mast, Man X, NP    Inpatient Medications: Scheduled Meds:  atorvastatin  10 mg Oral Daily   calcium carbonate  625 mg Oral BID WC   cholecalciferol  2,000 Units Oral Daily   clopidogrel  75 mg Oral Daily   enoxaparin (LOVENOX) injection  30 mg Subcutaneous Q24H   isosorbide mononitrate   120 mg Oral Daily   levothyroxine  75 mcg Oral Q0600   loratadine  10 mg Oral QHS   metoprolol succinate  75 mg Oral Daily   mirabegron ER  50 mg Oral Daily   nystatin  100,000 g Topical BID   pantoprazole  40 mg Oral Daily   [START ON 10/15/2018] prednisoLONE  7.5 mg Oral Once per day on Mon Tue Thu Sat   [  START ON 10/16/2018] predniSONE  5 mg Oral Once per day on Sun Wed   ranolazine  1,000 mg Oral BID   venlafaxine XR  225 mg Oral Q breakfast   Continuous Infusions:  PRN Meds: acetaminophen, fluticasone, ondansetron (ZOFRAN) IV  Allergies:    Allergies  Allergen Reactions   Nsaids Other (See Comments)    Due to chronic kidney failure   Butorphanol Other (See Comments)    agitation Constipation Produced a lot of urine, made patient feel crazy   Sulfa Antibiotics Rash   Bisoprolol Fumarate Other (See Comments)    Doesn't remember   Butorphanol Tartrate Other (See Comments)    Produced a lot of urine, made patient feel crazy   Codeine Nausea And Vomiting   Demerol [Meperidine] Nausea And Vomiting   Imipramine     Sweating, facial dysfunction    Meperidine And Related Nausea And Vomiting   Statins     MYALGIAS   Tequin [Gatifloxacin] Other (See Comments)    Caused hypoglycemia Low blood sugar   Brilinta [Ticagrelor] Rash    CAUSES PETECHIAE, PURPURA   Pseudoephedrine Hcl Palpitations   Septra [Sulfamethoxazole-Trimethoprim] Rash   Sulfamethoxazole-Trimethoprim Rash    Social History:   Social History   Tobacco Use   Smoking status: Former Smoker    Packs/day: 0.50    Years: 7.00    Pack years: 3.50    Types: Cigarettes    Quit date: 08/21/1969    Years since quitting: 49.1   Smokeless tobacco: Never Used  Substance Use Topics   Alcohol use: No    Alcohol/week: 0.0 standard drinks    Frequency: Never    Comment: rare   Drug use: No   Social History   Social History Narrative   Social History     Social History Narrative        Lives in [place] with husband.   Diet: Regular   Do you drink/eat things with caffeine? 1 cup caffeine per day.  Not much coffee   Marital status: Married                           What year were you married? 1964   Do you live in a house, apartment, assisted living, condo, trailer, etc)? [facility] at [facility]   Is it one or more stories?   How many persons live in your home? 2   Do you have any pets in your home? No   Current or past profession: Engineer, production (RN)   Do you exercise? I did                                                Type & how often: Swimming, Tai Chi, exercise classes   Do you have a living will? yes   Do you have a DNR Form? Yes   Do you have a POA/HPOA forms?               Family History:    Family History  Problem Relation Age of Onset   Heart disease Mother    Heart attack Mother    Heart disease Father    Heart attack Father    Diabetes Brother    Pulmonary embolism Brother    Stroke Paternal Grandmother    Hypertension  Neg Hx      ROS:  Please see the history of present illness.  All other ROS reviewed and negative.     Physical Exam/Data:   Vitals:   10/14/18 0100 10/14/18 0135 10/14/18 0252 10/14/18 0749  BP: 118/69 (!) 127/55 (!) 139/54 (!) 132/59  Pulse: 74 73  81  Resp: 15 14 (!) 22 18  Temp:  98.1 F (36.7 C) 98.2 F (36.8 C) 98 F (36.7 C)  TempSrc:  Oral Oral Oral  SpO2: 97% 99% 96% 96%  Weight:  88.9 kg    Height:  '4\' 10"'$  (1.473 m)      Intake/Output Summary (Last 24 hours) at 10/14/2018 1000 Last data filed at 10/14/2018 0842 Gross per 24 hour  Intake --  Output 300 ml  Net -300 ml   Last 3 Weights 10/14/2018 10/13/2018 10/06/2018  Weight (lbs) 195 lb 15.8 oz 198 lb 201 lb 3.2 oz  Weight (kg) 88.9 kg 89.812 kg 91.264 kg     Body mass index is 40.96 kg/m.  General:  Well nourished, well developed WF, in no acute distress HEENT: normal Lymph: no adenopathy Neck: minimal JVD Endocrine:  No  thryomegaly Vascular: No carotid bruits; FA pulses 2+ bilaterally without bruits  Cardiac:  normal S1, S2; RRR; no murmur  Lungs:  clear to auscultation bilaterally, no wheezing, rhonchi or rales  Abd: soft, nontender, no hepatomegaly  Ext: no edema Musculoskeletal:  No deformities, BUE and BLE strength normal and equal Skin: warm and dry  Neuro:  CNs 2-12 intact, no focal abnormalities noted Psych:  Normal affect   EKG:  The EKG was personally reviewed and demonstrates:  NSR, 72bpm, poor r wave progression, t wave inversion AvL Telemetry:  Telemetry was personally reviewed and demonstrates:  NSR, heart rates in the 70s, no arrhythmias noted  Relevant CV Studies:   LHC 01/12/2018  Prox LAD to Mid LAD lesion is 25% stenosed.  Ost Cx to Dist Cx lesion is 90% stenosed. SVG to OM is widely patent.  Mid RCA lesion is 90% stenosed. SVG to PDA is patent. Patent stents in distal graft.  SVG to diagonal is occluded.  Mid LAD lesion is 90% stenosed. LIMA to LAD is patent.  The left ventricular systolic function is normal.  LV end diastolic pressure is normal. LVEDP 10 mm Hg.  The left ventricular ejection fraction is 55-65% by visual estimate.  There is no aortic valve stenosis.   Echo 07/18/18 IMPRESSIONS  1. The left ventricle has normal systolic function, with an ejection fraction of 55-60%. The cavity size was normal. Left ventricular diastolic Doppler parameters are consistent with impaired relaxation. No evidence of left ventricular regional wall  motion abnormalities.  2. The right ventricle has normal systolic function. The cavity was normal. There is no increase in right ventricular wall thickness.  3. Trivial pericardial effusion is present.  4. There is mild mitral annular calcification present. No evidence of mitral valve stenosis. No significant mitral regurgitation.  5. The aortic valve is tricuspid. Mild calcification of the aortic valve. No stenosis of the aortic  valve.  6. The aortic root and ascending aorta are normal in size and structure.  7. The IVC is normal. No complete TR doppler jet so unable to estimate PA systolic pressure.  Laboratory Data:  High Sensitivity Troponin:   Recent Labs  Lab 10/13/18 2040 10/13/18 2253 10/14/18 0229  TROPONINIHS '5 7 6     '$ Cardiac EnzymesNo results for input(s): TROPONINI in  the last 168 hours. No results for input(s): TROPIPOC in the last 168 hours.  Chemistry Recent Labs  Lab 10/13/18 2040 10/14/18 0229  NA 138 140  K 4.1 4.2  CL 106 110  CO2 21* 19*  GLUCOSE 98 84  BUN 31* 28*  CREATININE 1.62* 1.40*  CALCIUM 8.7* 8.6*  GFRNONAA 30* 36*  GFRAA 35* 41*  ANIONGAP 11 11    No results for input(s): PROT, ALBUMIN, AST, ALT, ALKPHOS, BILITOT in the last 168 hours. Hematology Recent Labs  Lab 10/13/18 2040  WBC 11.4*  RBC 3.68*  HGB 11.3*  HCT 35.8*  MCV 97.3  MCH 30.7  MCHC 31.6  RDW 14.0  PLT 253   BNP Recent Labs  Lab 10/13/18 2040  BNP 318.5*    DDimer No results for input(s): DDIMER in the last 168 hours.   Radiology/Studies:  Dg Chest 2 View  Result Date: 10/13/2018 CLINICAL DATA:  Acute onset of shortness of breath and left arm pain. EXAM: CHEST - 2 VIEW COMPARISON:  Chest radiograph performed 07/17/2018 FINDINGS: The lungs are well-aerated. Mild vascular congestion is noted. There is no evidence of focal opacification, pleural effusion or pneumothorax. The heart is mildly enlarged. The patient is status post median sternotomy, with evidence of prior CABG. No acute osseous abnormalities are seen. IMPRESSION: Mild vascular congestion and mild cardiomegaly. Lungs remain grossly clear. Electronically Signed   By: Garald Balding M.D.   On: 10/13/2018 22:22    Assessment and Plan:   1. Chest discomfort & Left arm pain/CAD s/p CABG 2000 and 2 DES to SVG-RCA 11/2014 -Patient presented to the ER yesterday with chest discomfort/left arm pain on exertion, relieved with rest. NTG  did not improve pain. Received ASA in the ED -HS troponin 5>7>6. EKG with no ischemic changes -LHC in 12/2017 showed prox LAD to mid LAD 25% stenosed, ostcx to dist cx lesion 90% stenosed, SVG to OM widely patent, mid RCA lesions 90% stenosed, SVG to PDA is patent, patent stents in distal graft. SVG to diagonal is occluded, mid LAD lesion 90% stenosed, LIMA to LAD is patent, left ventricular sytolic function was normal, LVED pressure was 10. -Imdur, Metoprolol, Plavix, Atorvastatin home meds. ASA had been previously stopped for easy bruising -Unsure if this is ACS. It is worrisome that pain is worse on exertion and is relieved by rest. However patient does also have some atypical symptoms as well (pain not relieved with NTG). Prior heart-related pain has normally presented with shoulder pain for the patient.  -After discussion with MD will order a Lexi myoview stress test for tomorrow. NPO after midnight.   2. Chronic diastolic CHF -Not clear if patient is decompensated. CXR did show some mild vascular congestion. BNP 318 -Patient said she hasn't taken Lasix since April 2020. Will not likely diuresis due to Creatinine bump.  -Last echo was 06/2018 LVEF 55-60% with mild diastolic dysfunction -Continue current medications  3. AKI/CKD stage 3 -1.62>1.40 with baseline around 1.10 -continue to monitor -avoid nephrotoxic agents  4. HTN -B/P has been stable -Metoprolol '75mg'$  daily, home dose  5. Hyperlpidemia -LDL 64 01/12/2018 -goal <70 -continue statin  6. Morbid obesity -Patient has been working on losing weight.   7. OSA on CPAP -per IM   For questions or updates, please contact [facility] Please consult [email or web address] for contact info under     Signed, Cadence Ninfa Meeker, PA-C  10/14/2018 10:00 AM    ATTENDING ATTESTATION  I have seen, examined and  evaluated the patient this PM along with Cadence Ninfa Meeker, PA-C .  After reviewing all the available data and chart, we discussed  the patients laboratory, study & physical findings as well as symptoms in detail. I agree with her findings, examination as well as impression recommendations as per our discussion.     Ms. Leicht is here with some atypical sounding symptoms of left shoulder discomfort radiating down left arm and into the shoulder.  This to her is similar to her previous cardiac symptoms.  The symptoms are only occurring with exertion and and not at rest.  They go away with rest.  The fact that this is a new onset recurrence of symptoms would mean that the potentially an unstable process.  Thankfully she has essentially ruled out with negative cardiac biomarkers and has a normal EKG.  She is already on max dose of isosorbide mononitrate.  She is on pretty stable dose of Toprol.  Amlodipine was discontinued and that has helped her edema. She seems relatively euvolemic now, and is no longer on furosemide.  Given her age and the difficulty getting back and forth to the hospital and for procedures I think the best course of action is to continue to monitor day, NP after midnight with Myoview stress test tomorrow.  Further plans based on results.  (Test ordered)    Glenetta Hew, M.D., M.S. Interventional Cardiologist   Pager # [phone] Phone # [phone] [address] [place], [place] [address]

## 2018-10-14 NOTE — ED Notes (Signed)
Attempted report x1. 

## 2018-10-14 NOTE — H&P (Addendum)
History and Physical    Erin Ingram QMG:[id number] DOB: 07-17-1940 DOA: 10/13/2018  PCP: Mast, Man X, NP  Patient coming from: [facility]  I have personally briefly reviewed patient's old medical records in [place]  Chief Complaint: CP  HPI: Erin Ingram is a 78 y.o. female with medical history significant of CAD s/p CABG 2000, CKD stage 3, chronic diastolic CHF, OSA on CPAP, HTN, stable angina.  Patient presents to the ED with c/o CP.  L upper chest and L arm pain, squeezing in quality.  Onset while walking at 1pm today.  CP is intermittent and occurs every time she walks.  Unchanged with movement of LUE.  Has DOE but this is chronic and baseline.  Did have LUE pain yesterday as well after moving some boxes and papers around, but feels different compared to arm pain shes having today.  Last [facility] 12/2017.  No associated diaphoresis, lightheadedness, dizziness, nausea nor vomiting.  ED Course: Trop 5, repeat 7.  CXR with mild vasc congestion and mild cardiomegally.  BNP 318.  BP 123/61.  EKG reported as non ischemic though not available in epic for review right now (I will therefore order another to try and get one in Epic).   Review of Systems: As per HPI, otherwise all review of systems negative.  Past Medical History:  Diagnosis Date  . Anemia   . Arthritis    "fingers, back, shoulders, hips" (04/10/2016)  . Chronic diastolic CHF (congestive heart failure) (HCC)    a. normal EF, LVEDP at [facility] in 6/17 33 >> Lasix started   . CKD (chronic kidney disease), stage III ([place])   . Coronary artery disease    a. s/p CABG 2000 (SVG/ free LIMA Y graft to the diagonal and distal LAD, SVG to the OM 1, and SVG to the PDA) . b. Cath 12/19/2014 90% dSVG to RCA s/p 2 overlapping DES. c. LHC 2016, 2017 no intervention except diuresis needed. d. Low risk nuc 03/2016. e. Cath 12/2017 patent LIMA-LAD, SVG-OM, SVG-PDA but occluded SVG to diag.   . Depression    with  anxious component  . GERD (gastroesophageal reflux disease)   . History of hiatal hernia   . Hyperlipidemia   . Hypothyroidism   . Obesity   . OSA on CPAP   . PMR (polymyalgia rheumatica) (HCC)   . Pneumonia    "2-3 times" (04/10/2016)  . Stable angina (HCC)    microvascular, improved with Ranexa  . Subclavian artery stenosis (HCC)    a. L by cath note in 2016.  Marland Kitchen TIA (transient ischemic attack)     Past Surgical History:  Procedure Laterality Date  . BREAST BIOPSY Right 1964  . CARDIAC CATHETERIZATION  08   patent grafts, no culprit lesions, EF 65%  . CARDIAC CATHETERIZATION N/A 12/19/2014   Procedure: Left Heart Cath and Cors/Grafts Angiography;  Surgeon: Jettie Booze, MD;  Location: [place] [facility];  Service: Cardiovascular;  Laterality: N/A;  . CARDIAC CATHETERIZATION N/A 12/19/2014   Procedure: Coronary Stent Intervention;  Surgeon: Jettie Booze, MD;  Location: [place] [facility];  Service: Cardiovascular;  Laterality: N/A;  . CARDIAC CATHETERIZATION N/A 01/01/2015   Procedure: Left Heart Cath and Cors/Grafts Angiography;  Surgeon: Jettie Booze, MD;  Location: [place] [facility];  Service: Cardiovascular;  Laterality: N/A;  . CARDIAC CATHETERIZATION N/A 09/20/2015   Procedure: Left Heart Cath and Cors/Grafts Angiography;  Surgeon: Jettie Booze, MD;  Location: [place] [facility];  Service: Cardiovascular;  Laterality: N/A;  . CATARACT EXTRACTION W/ INTRAOCULAR LENS  IMPLANT, BILATERAL Bilateral 2011  . COLONOSCOPY WITH PROPOFOL N/A 07/27/2016   Procedure: COLONOSCOPY WITH PROPOFOL;  Surgeon: Wilford Corner, MD;  Location: [place];  Service: Endoscopy;  Laterality: N/A;  . CORONARY ARTERY BYPASS GRAFT  2000   ASCVD, multivessel, S./P.  . DILATION AND CURETTAGE OF UTERUS  1980s  . ESOPHAGOGASTRODUODENOSCOPY (EGD) WITH PROPOFOL N/A 07/27/2016   Procedure: ESOPHAGOGASTRODUODENOSCOPY (EGD) WITH PROPOFOL;  Surgeon: Wilford Corner, MD;  Location: [place];  Service: Endoscopy;  Laterality: N/A;  . FRACTURE SURGERY    . LEFT HEART CATH AND CORS/GRAFTS ANGIOGRAPHY N/A 01/12/2018   Procedure: LEFT HEART CATH AND CORS/GRAFTS ANGIOGRAPHY;  Surgeon: Jettie Booze, MD;  Location: [place] [facility];  Service: Cardiovascular;  Laterality: N/A;  . [place]     reports that she quit smoking about 49 years ago. Her smoking use included cigarettes. She has a 3.50 pack-year smoking history. She has never used smokeless tobacco. She reports that she does not drink alcohol or use drugs.  Allergies  Allergen Reactions  . Nsaids Other (See Comments)    Due to chronic kidney failure  . Butorphanol Other (See Comments)    agitation Constipation Produced a lot of urine, made patient feel crazy  . Sulfa Antibiotics Rash  . Bisoprolol Fumarate Other (See Comments)    Doesn't remember  . Butorphanol Tartrate Other (See Comments)    Produced a lot of urine, made patient feel crazy  . Codeine Nausea And Vomiting  . Demerol [Meperidine] Nausea And Vomiting  . Imipramine     Sweating, facial dysfunction   . Meperidine And Related Nausea And Vomiting  . Statins     MYALGIAS  . Tequin [Gatifloxacin] Other (See Comments)    Caused hypoglycemia Low blood sugar  . Brilinta [Ticagrelor] Rash    CAUSES PETECHIAE, PURPURA  . Pseudoephedrine Hcl Palpitations  . Septra [Sulfamethoxazole-Trimethoprim] Rash  . Sulfamethoxazole-Trimethoprim Rash    Family History  Problem Relation Age of Onset  . Heart disease Mother   . Heart attack Mother   . Heart disease Father   . Heart attack Father   . Diabetes Brother   . Pulmonary embolism Brother   . Stroke Paternal Grandmother   . Hypertension Neg Hx      Prior to Admission medications   Medication Sig Start Date End Date Taking? Authorizing Provider  alendronate (FOSAMAX) 70 MG tablet Take 70 mg by mouth once a week. Take with a full glass of water on an empty  stomach.    [provider]  atorvastatin (LIPITOR) 10 MG tablet Take 10 mg by mouth daily.    [provider]  calcium carbonate (OS-CAL) 600 MG TABS tablet Take 600 mg by mouth 2 (two) times daily with a meal.    [provider]  Cholecalciferol (VITAMIN D PO) Take 2,000 Units by mouth daily.  07/20/18   [provider]  clopidogrel (PLAVIX) 75 MG tablet TAKE 1 TABLET ONCE DAILY. 06/16/18   Jettie Booze, MD  Coenzyme Q10 10 MG capsule Take 1 capsule (10 mg total) by mouth at bedtime. 10/07/18 11/06/18  Mast, Man X, NP  fluticasone (FLONASE) 50 MCG/ACT nasal spray Place 2 sprays into both nostrils daily as needed for allergies.     [provider]  Glucosamine-Chondroit-Vit C-Mn (GLUCOSAMINE 1500 COMPLEX) CAPS Take 2 capsules by mouth  daily after breakfast.    [provider]  isosorbide mononitrate (IMDUR) 60 MG 24 hr tablet Take 2 tablets (120 mg total) by mouth daily. 03/21/18   Jettie Booze, MD  levocetirizine (XYZAL) 5 MG tablet TAKE 1 TABLET ONCE DAILY IN THE EVENING. 09/12/18   Mast, Man X, NP  levothyroxine (SYNTHROID) 75 MCG tablet TAKE 1 TABLET ONCE DAILY ON EMPTY STOMACH. 09/12/18   Mast, Man X, NP  metoprolol succinate (TOPROL-XL) 50 MG 24 hr tablet Take 1.5 tablets orally once a day. Take with or immediately following a meal. 09/19/18   Jettie Booze, MD  MYRBETRIQ 50 MG TB24 tablet TAKE 1 TABLET BY MOUTH DAILY. 09/26/18   Mast, Man X, NP  nitroGLYCERIN (NITROSTAT) 0.4 MG SL tablet Place 0.4 mg under the tongue every 5 (five) minutes as needed for chest pain. X 3 doses 07/19/18   [provider]  nystatin (MYCOSTATIN/NYSTOP) powder Apply 100,000 g topically 2 (two) times daily. 10/07/18 11/06/18  Mast, Man X, NP  pantoprazole (PROTONIX) 40 MG tablet Take 1 tablet (40 mg total) by mouth daily. 09/02/18   Mast, Man X, NP  predniSONE (DELTASONE) 5 MG tablet Take 5-7.5 mg by mouth See admin instructions. Take 5 mg on  Sun and Wednesday.Take 7.5 mg all other days, take With food or milk.    [provider]  ranolazine (RANEXA) 1000 MG SR tablet TAKE 1 TABLET BY MOUTH TWICE DAILY. 03/07/18   Jettie Booze, MD  venlafaxine XR (EFFEXOR-XR) 75 MG 24 hr capsule TAKE 3 CAPSULES ONCE A DAY. 09/12/18   Mast, Man X, NP    Physical Exam: Vitals:   10/13/18 2045 10/13/18 2248 10/13/18 2300 10/14/18 0000  BP: (!) 145/52 133/84 (!) 144/52 123/61  Pulse: 70 77 74 71  Resp: '16 14 19 15  '$ Temp:      TempSrc:      SpO2: 98% 96% 96% 97%  Weight:      Height:        Constitutional: NAD, calm, comfortable Eyes: PERRL, lids and conjunctivae normal ENMT: Mucous membranes are moist. Posterior pharynx clear of any exudate or lesions.Normal dentition.  Neck: normal, supple, no masses, no thyromegaly Respiratory: clear to auscultation bilaterally, no wheezing, no crackles. Normal respiratory effort. No accessory muscle use.  Cardiovascular: Regular rate and rhythm, no murmurs / rubs / gallops. No extremity edema. 2+ pedal pulses. No carotid bruits.  Abdomen: no tenderness, no masses palpated. No hepatosplenomegaly. Bowel sounds positive.  Musculoskeletal: no clubbing / cyanosis. No joint deformity upper and lower extremities. Good ROM, no contractures. Normal muscle tone.  Skin: no rashes, lesions, ulcers. No induration Neurologic: CN 2-12 grossly intact. Sensation intact, DTR normal. Strength 5/5 in all 4.  Psychiatric: Normal judgment and insight. Alert and oriented x 3. Normal mood.    Labs on Admission: I have personally reviewed following labs and imaging studies  CBC: Recent Labs  Lab 10/13/18 2040  WBC 11.4*  NEUTROABS 8.3*  HGB 11.3*  HCT 35.8*  MCV 97.3  PLT 007   Basic Metabolic Panel: Recent Labs  Lab 10/13/18 2040  NA 138  K 4.1  CL 106  CO2 21*  GLUCOSE 98  BUN 31*  CREATININE 1.62*  CALCIUM 8.7*   GFR: Estimated Creatinine Clearance: 26.9 mL/min (A) (by C-G formula based  on SCr of 1.62 mg/dL (H)). Liver Function Tests: No results for input(s): AST, ALT, ALKPHOS, BILITOT, PROT, ALBUMIN in the last 168 hours. No results for  input(s): LIPASE, AMYLASE in the last 168 hours. No results for input(s): AMMONIA in the last 168 hours. Coagulation Profile: No results for input(s): INR, PROTIME in the last 168 hours. Cardiac Enzymes: No results for input(s): CKTOTAL, CKMB, CKMBINDEX, TROPONINI in the last 168 hours. BNP (last 3 results) No results for input(s): PROBNP in the last 8760 hours. HbA1C: No results for input(s): HGBA1C in the last 72 hours. CBG: No results for input(s): GLUCAP in the last 168 hours. Lipid Profile: No results for input(s): CHOL, HDL, LDLCALC, TRIG, CHOLHDL, LDLDIRECT in the last 72 hours. Thyroid Function Tests: No results for input(s): TSH, T4TOTAL, FREET4, T3FREE, THYROIDAB in the last 72 hours. Anemia Panel: No results for input(s): VITAMINB12, FOLATE, FERRITIN, TIBC, IRON, RETICCTPCT in the last 72 hours. Urine analysis:    Component Value Date/Time   COLORURINE YELLOW 07/17/2018 2204   APPEARANCEUR CLEAR 07/17/2018 2204   LABSPEC 1.009 07/17/2018 2204   PHURINE 5.0 07/17/2018 2204   GLUCOSEU NEGATIVE 07/17/2018 2204   HGBUR NEGATIVE 07/17/2018 [place] 07/17/2018 [place] 07/17/2018 [place] 07/17/2018 2204   NITRITE NEGATIVE 07/17/2018 2204   LEUKOCYTESUR NEGATIVE 07/17/2018 2204    Radiological Exams on Admission: Dg Chest 2 View  Result Date: 10/13/2018 CLINICAL DATA:  Acute onset of shortness of breath and left arm pain. EXAM: CHEST - 2 VIEW COMPARISON:  Chest radiograph performed 07/17/2018 FINDINGS: The lungs are well-aerated. Mild vascular congestion is noted. There is no evidence of focal opacification, pleural effusion or pneumothorax. The heart is mildly enlarged. The patient is status post median sternotomy, with evidence of prior CABG. No acute osseous  abnormalities are seen. IMPRESSION: Mild vascular congestion and mild cardiomegaly. Lungs remain grossly clear. Electronically Signed   By: Garald Balding M.D.   On: 10/13/2018 22:22    EKG: ordered and pending  Assessment/Plan Principal Problem:   Chest pain, rule out acute myocardial infarction Active Problems:   Essential hypertension   (HFpEF) heart failure with preserved ejection fraction (HCC)   CAD (coronary artery disease)   Kidney disease, chronic, stage III (moderate, EGFR 30-59 ml/min) (HCC)    1. CP r/o - 1. CP obs pathway 2. Serial trops 3. Tele monitor 4. NPO 5. Cards eval in AM 6. Try SL NTG if BP elevates enough for [place] to use this 2. CAD - 1. Cont plavix 2. Got ASA 325 in ED 3. CKD stage 3 - 1. Creat slightly above baseline today 2. Repeat BMP in AM 4. Chronic diastolic CHF - 1. Continue Imdur 2. Not clear that this is decompensated at this point though 3. Patient with some DOE that she says is chronic.  Is breathing just fine laying flat on her back though during my evaluation. 4. Will hold off on lasix for the moment with the creat slight elevation. 5. HTN - 1. Continue home BP meds  DVT prophylaxis: Lovenox Code Status: DNR - ACP and DNR on file Family Communication: No family in room Disposition Plan: Home after admit Consults called: Message sent to P.Trent for cards eval in AM Admission status: Place in [place]    , [place] Hospitalists  How to contact the [facility] Attending or Consulting provider [place] or covering provider during after hours [place], for this patient?  1. Check the care team in [facility] and look for a) attending/consulting [facility] provider listed and b) the [facility] team listed 2. Log into [email or web address]  Amion Physician Scheduling and messaging for groups and whole hospitals  On call and physician scheduling software for group practices, residents, hospitalists and other medical providers for call, clinic, rotation and shift  schedules. OnCall [employer] is a hospital-wide system for scheduling doctors and paging doctors on call. EasyPlot is for scientific plotting and data analysis.  [email or web address]  and use [place]'s universal password to access. If you do not have the password, please contact the hospital operator.  3. Locate the [facility] provider you are looking for under [facility] and page to a number that you can be directly reached. 4. If you still have difficulty reaching the provider, please page the [facility] (Director on Call) for the Hospitalists listed on amion for assistance.  10/14/2018, 12:46 AM

## 2018-10-14 NOTE — Progress Notes (Signed)
Per HPI: Erin Ingram is a 78 y.o. female with medical history significant of CAD s/p CABG 2000, CKD stage 3, chronic diastolic CHF, OSA on CPAP, HTN, stable angina.  Patient presents to the ED with c/o CP.  L upper chest and L arm pain, squeezing in quality.  Onset while walking at 1pm today.  CP is intermittent and occurs every time she walks.  Unchanged with movement of LUE.  Has DOE but this is chronic and baseline.  Did have LUE pain yesterday as well after moving some boxes and papers around, but feels different compared to arm pain shes having today.  Last [facility] 12/2017.  No associated diaphoresis, lightheadedness, dizziness, nausea nor vomiting.  Patient seen and evaluated at bedside with minimal ongoing chest pain noted, but no shortness of breath or other concerns. Patient admitted with chest pain that has been evaluated by Cardiology with plans for [facility] stress test in am. She also has AKI on CKD stage 3 along with stable chronic diastolic CHF for which diuretics will currently be held.   Will plan to repeat am renal panel. Dispo per Cardiology after stress test in am.

## 2018-10-14 NOTE — ED Notes (Signed)
ED TO INPATIENT HANDOFF REPORT  ED Nurse Name and Phone #: Annie Main [phone]  R Name/Age/Gender Erin Ingram 78 y.o. female Room/Bed: 030C/030C  Code Status   Code Status: DNR  Home/SNF/Other Nursing Home Patient oriented to: self, place, time and situation Is this baseline? Yes   Triage Complete: Triage complete  Chief Complaint Arm Pain   Triage Note Pt arrived via [facility] from [facility] [place]. Advised of left arm pain and exertional dyspnea after moving boxes in her residence. Symptoms started yesterday.    Allergies Allergies  Allergen Reactions  . Nsaids Other (See Comments)    Due to chronic kidney failure  . Butorphanol Other (See Comments)    agitation Constipation Produced a lot of urine, made patient feel crazy  . Sulfa Antibiotics Rash  . Bisoprolol Fumarate Other (See Comments)    Doesn't remember  . Butorphanol Tartrate Other (See Comments)    Produced a lot of urine, made patient feel crazy  . Codeine Nausea And Vomiting  . Demerol [Meperidine] Nausea And Vomiting  . Imipramine     Sweating, facial dysfunction   . Meperidine And Related Nausea And Vomiting  . Statins     MYALGIAS  . Tequin [Gatifloxacin] Other (See Comments)    Caused hypoglycemia Low blood sugar  . Brilinta [Ticagrelor] Rash    CAUSES PETECHIAE, PURPURA  . Pseudoephedrine Hcl Palpitations  . Septra [Sulfamethoxazole-Trimethoprim] Rash  . Sulfamethoxazole-Trimethoprim Rash    Level of Care/Admitting Diagnosis ED Disposition    ED Disposition Condition Comment   Admit  Hospital Area: [facility] [[id number]]  Level of Care: Progressive [102]  I expect the patient will be discharged within 24 hours: No (not a candidate for 5C-Observation unit)  Covid Evaluation: Asymptomatic Screening Protocol (No Symptoms)  Diagnosis: Chest pain, rule out acute myocardial infarction [604540]  Admitting Physician: Etta Quill [[id number]]  Attending Physician:  Etta Quill [[id number]]  PT Class (Do Not Modify): Observation [104]  PT Acc Code (Do Not Modify): Observation [10022]       B Medical/Surgery History Past Medical History:  Diagnosis Date  . Anemia   . Arthritis    "fingers, back, shoulders, hips" (04/10/2016)  . Chronic diastolic CHF (congestive heart failure) (HCC)    a. normal EF, LVEDP at [facility] in 6/17 33 >> Lasix started   . CKD (chronic kidney disease), stage III ([place])   . Coronary artery disease    a. s/p CABG 2000 (SVG/ free LIMA Y graft to the diagonal and distal LAD, SVG to the OM 1, and SVG to the PDA) . b. Cath 12/19/2014 90% dSVG to RCA s/p 2 overlapping DES. c. LHC 2016, 2017 no intervention except diuresis needed. d. Low risk nuc 03/2016. e. Cath 12/2017 patent LIMA-LAD, SVG-OM, SVG-PDA but occluded SVG to diag.   . Depression    with anxious component  . GERD (gastroesophageal reflux disease)   . History of hiatal hernia   . Hyperlipidemia   . Hypothyroidism   . Obesity   . OSA on CPAP   . PMR (polymyalgia rheumatica) (HCC)   . Pneumonia    "2-3 times" (04/10/2016)  . Stable angina (HCC)    microvascular, improved with Ranexa  . Subclavian artery stenosis (HCC)    a. L by cath note in 2016.  Marland Kitchen TIA (transient ischemic attack)    Past Surgical History:  Procedure Laterality Date  . BREAST BIOPSY Right 1964  . CARDIAC CATHETERIZATION  08  patent grafts, no culprit lesions, EF 65%  . CARDIAC CATHETERIZATION N/A 12/19/2014   Procedure: Left Heart Cath and Cors/Grafts Angiography;  Surgeon: Jettie Booze, MD;  Location: [place] [facility];  Service: Cardiovascular;  Laterality: N/A;  . CARDIAC CATHETERIZATION N/A 12/19/2014   Procedure: Coronary Stent Intervention;  Surgeon: Jettie Booze, MD;  Location: [facility];  Service: Cardiovascular;  Laterality: N/A;  . CARDIAC CATHETERIZATION N/A 01/01/2015   Procedure: Left Heart Cath and Cors/Grafts Angiography;  Surgeon: Jettie Booze, MD;   Location: [facility];  Service: Cardiovascular;  Laterality: N/A;  . CARDIAC CATHETERIZATION N/A 09/20/2015   Procedure: Left Heart Cath and Cors/Grafts Angiography;  Surgeon: Jettie Booze, MD;  Location: [facility];  Service: Cardiovascular;  Laterality: N/A;  . CATARACT EXTRACTION W/ INTRAOCULAR LENS  IMPLANT, BILATERAL Bilateral 2011  . COLONOSCOPY WITH PROPOFOL N/A 07/27/2016   Procedure: COLONOSCOPY WITH PROPOFOL;  Surgeon: Wilford Corner, MD;  Location: [facility];  Service: Endoscopy;  Laterality: N/A;  . CORONARY ARTERY BYPASS GRAFT  2000   ASCVD, multivessel, S./P.  . DILATION AND CURETTAGE OF UTERUS  1980s  . ESOPHAGOGASTRODUODENOSCOPY (EGD) WITH PROPOFOL N/A 07/27/2016   Procedure: ESOPHAGOGASTRODUODENOSCOPY (EGD) WITH PROPOFOL;  Surgeon: Wilford Corner, MD;  Location: [facility];  Service: Endoscopy;  Laterality: N/A;  . FRACTURE SURGERY    . LEFT HEART CATH AND CORS/GRAFTS ANGIOGRAPHY N/A 01/12/2018   Procedure: LEFT HEART CATH AND CORS/GRAFTS ANGIOGRAPHY;  Surgeon: Jettie Booze, MD;  Location: [facility];  Service: Cardiovascular;  Laterality: N/A;  . PATELLA FRACTURE SURGERY Left 1993     A IV Location/Drains/Wounds Patient Lines/Drains/Airways Status   Active Line/Drains/Airways    Name:   Placement date:   Placement time:   Site:   Days:   Peripheral IV 07/17/18 Left Antecubital   07/17/18    2056    Antecubital   89   Peripheral IV 10/13/18 Anterior Antecubital   10/13/18    1952    Antecubital   1   External Urinary Catheter   07/18/18    0230    -   88          Intake/Output Last 24 hours No intake or output data in the 24 hours ending 10/14/18 0051  Labs/Imaging Results for orders placed or performed during the hospital encounter of 10/13/18 (from the past 48 hour(s))  CBC with Differential     Status: Abnormal   Collection Time: 10/13/18  8:40 PM  Result Value Ref Range   WBC 11.4 (H) 4.0 - 10.5 K/uL   RBC 3.68  (L) 3.87 - 5.11 MIL/uL   Hemoglobin 11.3 (L) 12.0 - 15.0 g/dL   HCT 35.8 (L) 36.0 - 46.0 %   MCV 97.3 80.0 - 100.0 fL   MCH 30.7 26.0 - 34.0 pg   MCHC 31.6 30.0 - 36.0 g/dL   RDW 14.0 11.5 - 15.5 %   Platelets 253 150 - 400 K/uL   nRBC 0.0 0.0 - 0.2 %   Neutrophils Relative % 73 %   Neutro Abs 8.3 (H) 1.7 - 7.7 K/uL   Lymphocytes Relative 17 %   Lymphs Abs 1.9 0.7 - 4.0 K/uL   Monocytes Relative 7 %   Monocytes Absolute 0.8 0.1 - 1.0 K/uL   Eosinophils Relative 3 %   Eosinophils Absolute 0.3 0.0 - 0.5 K/uL   Basophils Relative 0 %   Basophils Absolute 0.0 0.0 - 0.1  K/uL   Immature Granulocytes 0 %   Abs Immature Granulocytes 0.05 0.00 - 0.07 K/uL    Comment: Performed at [facility], [place] [address], [place], [place] [address]  Basic metabolic panel     Status: Abnormal   Collection Time: 10/13/18  8:40 PM  Result Value Ref Range   Sodium 138 135 - 145 mmol/L   Potassium 4.1 3.5 - 5.1 mmol/L   Chloride 106 98 - 111 mmol/L   CO2 21 (L) 22 - 32 mmol/L   Glucose, Bld 98 70 - 99 mg/dL   BUN 31 (H) 8 - 23 mg/dL   Creatinine, Ser 1.62 (H) 0.44 - 1.00 mg/dL   Calcium 8.7 (L) 8.9 - 10.3 mg/dL   GFR calc non Af Amer 30 (L) >60 mL/min   GFR calc Af Amer 35 (L) >60 mL/min   Anion gap 11 5 - 15    Comment: Performed at [facility], [place] [address], [place], [place] [address]  Troponin I (High Sensitivity)     Status: None   Collection Time: 10/13/18  8:40 PM  Result Value Ref Range   Troponin I (High Sensitivity) 5 <18 ng/L    Comment: (NOTE) Elevated high sensitivity troponin I (hsTnI) values and significant  changes across serial measurements may suggest ACS but many other  chronic and acute conditions are known to elevate hsTnI results.  Refer to the "Links" section for chest pain algorithms and additional  guidance. Performed at [facility], [place] [address] [place], [place] [address]   Brain natriuretic peptide     Status: Abnormal   Collection Time:  10/13/18  8:40 PM  Result Value Ref Range   B Natriuretic Peptide 318.5 (H) 0.0 - 100.0 pg/mL    Comment: Performed at [place] [address], [place], [place] [address]  Troponin I (High Sensitivity)     Status: None   Collection Time: 10/13/18 10:53 PM  Result Value Ref Range   Troponin I (High Sensitivity) 7 <18 ng/L    Comment: (NOTE) Elevated high sensitivity troponin I (hsTnI) values and significant  changes across serial measurements may suggest ACS but many other  chronic and acute conditions are known to elevate hsTnI results.  Refer to the "Links" section for chest pain algorithms and additional  guidance. Performed at [facility], [place] [address] [place], [place] [address]    Dg Chest 2 View  Result Date: 10/13/2018 CLINICAL DATA:  Acute onset of shortness of breath and left arm pain. EXAM: CHEST - 2 VIEW COMPARISON:  Chest radiograph performed 07/17/2018 FINDINGS: The lungs are well-aerated. Mild vascular congestion is noted. There is no evidence of focal opacification, pleural effusion or pneumothorax. The heart is mildly enlarged. The patient is status post median sternotomy, with evidence of prior CABG. No acute osseous abnormalities are seen. IMPRESSION: Mild vascular congestion and mild cardiomegaly. Lungs remain grossly clear. Electronically Signed   By: Garald Balding M.D.   On: 10/13/2018 22:22    Pending Labs Unresulted Labs (From admission, onward)    Start     Ordered   10/14/18 1093  Basic metabolic panel  Tomorrow morning,   R     10/14/18 0035   10/14/18 0014  SARS Coronavirus 2 (CEPHEID - Performed in [place] [facility] lab), [place]  (Asymptomatic Patients Labs)  Once,   STAT    Question:  Rule Out  Answer:  Yes  10/14/18 0013          Vitals/Pain Today's Vitals   10/13/18 2248 10/13/18 2300 10/14/18 0000 10/14/18 0000  BP: 133/84 (!) 144/52 123/61   Pulse: 77 74 71   Resp: 14 19 15    Temp:      TempSrc:      SpO2: 96%  96% 97%   Weight:      Height:      PainSc:    6     Isolation Precautions No active isolations  Medications Medications  acetaminophen (TYLENOL) tablet 650 mg (has no administration in time range)  ondansetron (ZOFRAN) injection 4 mg (has no administration in time range)  enoxaparin (LOVENOX) injection 40 mg (has no administration in time range)  aspirin chewable tablet 324 mg (324 mg Oral Given 10/13/18 2157)    Mobility walks with person assist Moderate fall risk   Focused Assessments Cardiac Assessment Handoff:  Cardiac Rhythm: Normal sinus rhythm Lab Results  Component Value Date   TROPONINI <0.03 07/17/2018   No results found for: DDIMER Does the Patient currently have chest pain? Yes     R Recommendations: See Admitting Provider Note  Report given to:   Additional Notes:

## 2018-10-15 ENCOUNTER — Inpatient Hospital Stay (HOSPITAL_COMMUNITY): Payer: Medicare Other

## 2018-10-15 LAB — BASIC METABOLIC PANEL
Anion gap: 10 (ref 5–15)
BUN: 22 mg/dL (ref 8–23)
CO2: 22 mmol/L (ref 22–32)
Calcium: 8.9 mg/dL (ref 8.9–10.3)
Chloride: 107 mmol/L (ref 98–111)
Creatinine, Ser: 1.34 mg/dL — ABNORMAL HIGH (ref 0.44–1.00)
GFR calc Af Amer: 44 mL/min — ABNORMAL LOW (ref >60)
GFR calc non Af Amer: 38 mL/min — ABNORMAL LOW (ref >60)
Glucose, Bld: 85 mg/dL (ref 70–99)
Potassium: 3.8 mmol/L (ref 3.5–5.1)
Sodium: 139 mmol/L (ref 135–145)

## 2018-10-15 LAB — NM MYOCAR MULTI W/SPECT W/WALL MOTION / EF
LV dias vol: 91 mL (ref 46–106)
TID: 1.37

## 2018-10-15 MED ORDER — REGADENOSON 0.4 MG/5ML IV SOLN
INTRAVENOUS | Status: AC
  Filled 2018-10-15: qty 5

## 2018-10-15 MED ORDER — TECHNETIUM TC 99M TETROFOSMIN IV KIT
10.0000 | PACK | Freq: Once | INTRAVENOUS | Status: AC | PRN
  Administered 2018-10-15: 10 via INTRAVENOUS

## 2018-10-15 MED ORDER — TECHNETIUM TC 99M TETROFOSMIN IV KIT
30.0000 | PACK | Freq: Once | INTRAVENOUS | Status: AC | PRN
  Administered 2018-10-15: 30 via INTRAVENOUS

## 2018-10-15 MED ORDER — REGADENOSON 0.4 MG/5ML IV SOLN
0.4000 mg | Freq: Once | INTRAVENOUS | Status: AC
  Administered 2018-10-15: 0.4 mg via INTRAVENOUS
  Filled 2018-10-15: qty 5

## 2018-10-15 NOTE — Discharge Summary (Signed)
Physician Discharge Summary  Erin Ingram  DHR:[id number]  DOB: 12-09-1940  DOA: 10/13/2018 PCP: Mast, Man X, NP  Admit date: 10/13/2018 Discharge date: 10/15/2018  Admitted From: Home Disposition: Home  Recommendations for Outpatient Follow-up:  1. Follow up with PCP in 1-2 weeks 2. Update cholesterol profile. 3. Please obtain BMP/CBC in one week to monitor renal function and WBC. 4. Follow-up with cardiology, they are arranging appointment.  Home Health: Home health PT  Discharge Condition: Stable CODE STATUS: DNR Diet recommendation: Heart Healthy   Brief/Interim Summary: For full details see H&P/Progress note, but in brief, Erin Ingram is a 78 year old female with medical history significant for CAD status post CABG in 2000, CKD stage III, chronic diastolic CHF, OSA on CPAP, hypertension and stable angina who presented to the emergency department complaining of left sided chest pain.  ED work-up showed mild elevated BNP, creatinine slightly above baseline normal troponins and negative x-ray.  Given her cardiac history patient was admitted with working diagnosis of chest pain rule out ACS.  In the ED she received ASA and nitroglycerin.  Cardiology was consulted, nuclear stress test was performed which was negative for acute ischemia.  Chest pain has resolved, and patient was cleared by cardiology for discharge and follow-up as an outpatient.  Patient was working with PT and doing well, tolerated diet well, therefore she was deemed stable for discharge home.  Subjective: Patient seen and examined, she denies chest pain, shortness of breath and palpitations.  No other concerns today.  Discharge Diagnoses/Hospital Course:  Principal Problem:   Chest pain, rule out acute myocardial infarction Active Problems:   Essential hypertension   (HFpEF) heart failure with preserved ejection fraction (HCC)   CAD (coronary artery disease)   Kidney disease, chronic, stage III (moderate,  EGFR 30-59 ml/min) (HCC)  Stable angina/CAD status post CABG 2000 ACS ruled out, some atypical symptoms could be musculoskeletal from her moving boxes.  Nuclear stress test does not show any acute ischemia.  Cardiology recommend to continue home medications with Imdur, metoprolol, Plavix, atorvastatin at current dose.  LDL goal less than 70.  Patient will follow-up with cardiology as an outpatient.  Chronic diastolic CHF BNP slightly elevated, however no signs fluid overload at this time.  She was not diuresed during hospital stay.  She had a mild increase in creatinine.  Her last echocardiogram showed LVEF 55 to 60% with mild diastolic dysfunction.  Follow-up with cardiologist outpatient.  Continue home medications.  Acute on chronic kidney disease stage III Likely from dehydration, baseline creatinine around 1.1.  Creatinine peaked at 1.6.  Creatinine upon discharge 1.3.  Encourage oral hydration and avoid nephrotoxic agent.  Avoid NSAIDs.  Follow-up with PCP for renal function monitoring.  Hypertension BP stable during hospital stay, continue home medications.  Follow-up with PCP.  OSA on CPAP Stable during hospital stay.  All other chronic medical condition were stable during the hospitalization.  Patient was seen by physical therapy, recommending home health PT. On the day of the discharge the patient's vitals were stable, and no other acute medical condition were reported by patient. the patient was felt safe to be discharge to home  Discharge Instructions  You were cared for by a hospitalist during your hospital stay. If you have any questions about your discharge medications or the care you received while you were in the hospital after you are discharged, you can call the unit and asked to speak with the hospitalist on call if the hospitalist that  took care of you is not available. Once you are discharged, your primary care physician will handle any further medical issues. Please note  that NO REFILLS for any discharge medications will be authorized once you are discharged, as it is imperative that you return to your primary care physician (or establish a relationship with a primary care physician if you do not have one) for your aftercare needs so that they can reassess your need for medications and monitor your lab values.  Discharge Instructions    Diet - low sodium heart healthy   Complete by: As directed    Increase activity slowly   Complete by: As directed      Allergies as of 10/15/2018      Reactions   Nsaids Other (See Comments)   Due to chronic kidney failure   Butorphanol Other (See Comments)   agitation Constipation Produced a lot of urine, made patient feel crazy   Sulfa Antibiotics Rash   Bisoprolol Fumarate Other (See Comments)   Doesn't remember   Butorphanol Tartrate Other (See Comments)   Produced a lot of urine, made patient feel crazy   Codeine Nausea And Vomiting   Demerol [meperidine] Nausea And Vomiting   Imipramine    Sweating, facial dysfunction    Meperidine And Related Nausea And Vomiting   Statins    MYALGIAS   Tequin [gatifloxacin] Other (See Comments)   Caused hypoglycemia Low blood sugar   Brilinta [ticagrelor] Rash   CAUSES PETECHIAE, PURPURA   Pseudoephedrine Hcl Palpitations   Septra [sulfamethoxazole-trimethoprim] Rash   Sulfamethoxazole-trimethoprim Rash      Medication List    TAKE these medications   alendronate 70 MG tablet Commonly known as: FOSAMAX Take 70 mg by mouth every Sunday.   atorvastatin 10 MG tablet Commonly known as: LIPITOR Take 10 mg by mouth daily.   calcium carbonate 600 MG Tabs tablet Commonly known as: OS-CAL Take 600 mg by mouth 2 (two) times daily with a meal.   clopidogrel 75 MG tablet Commonly known as: PLAVIX TAKE 1 TABLET ONCE DAILY.   Coenzyme Q10 10 MG capsule Take 1 capsule (10 mg total) by mouth at bedtime.   fluticasone 50 MCG/ACT nasal spray Commonly known as:  FLONASE Place 2 sprays into both nostrils daily as needed for allergies.   Glucosamine 1500 Complex Caps Take 2 capsules by mouth daily after breakfast.   isosorbide mononitrate 60 MG 24 hr tablet Commonly known as: IMDUR Take 2 tablets (120 mg total) by mouth daily.   levocetirizine 5 MG tablet Commonly known as: XYZAL TAKE 1 TABLET ONCE DAILY IN THE EVENING. What changed: See the new instructions.   levothyroxine 75 MCG tablet Commonly known as: SYNTHROID TAKE 1 TABLET ONCE DAILY ON EMPTY STOMACH. What changed: See the new instructions.   metoprolol succinate 50 MG 24 hr tablet Commonly known as: TOPROL-XL Take 1.5 tablets orally once a day. Take with or immediately following a meal. What changed:   how much to take  how to take this  when to take this  additional instructions   Myrbetriq 50 MG Tb24 tablet Generic drug: mirabegron ER TAKE 1 TABLET BY MOUTH DAILY. What changed: how much to take   nitroGLYCERIN 0.4 MG SL tablet Commonly known as: NITROSTAT Place 0.4 mg under the tongue every 5 (five) minutes as needed for chest pain. X 3 doses   nystatin powder Commonly known as: MYCOSTATIN/NYSTOP Apply 100,000 g topically 2 (two) times daily.   pantoprazole 40 MG  tablet Commonly known as: PROTONIX Take 1 tablet (40 mg total) by mouth daily.   predniSONE 5 MG tablet Commonly known as: DELTASONE Take 5-7.5 mg by mouth See admin instructions. Take 1 tablet on Sunday and Wednesday then Take 1 and 1/2 tablets  all other days   ranolazine 1000 MG SR tablet Commonly known as: RANEXA TAKE 1 TABLET BY MOUTH TWICE DAILY.   venlafaxine XR 75 MG 24 hr capsule Commonly known as: EFFEXOR-XR TAKE 3 CAPSULES ONCE A DAY. What changed: See the new instructions.   Vitamin D 50 MCG (2000 UT) tablet Take 2,000 Units by mouth daily.       Allergies  Allergen Reactions  . Nsaids Other (See Comments)    Due to chronic kidney failure  . Butorphanol Other (See  Comments)    agitation Constipation Produced a lot of urine, made patient feel crazy  . Sulfa Antibiotics Rash  . Bisoprolol Fumarate Other (See Comments)    Doesn't remember  . Butorphanol Tartrate Other (See Comments)    Produced a lot of urine, made patient feel crazy  . Codeine Nausea And Vomiting  . Demerol [Meperidine] Nausea And Vomiting  . Imipramine     Sweating, facial dysfunction   . Meperidine And Related Nausea And Vomiting  . Statins     MYALGIAS  . Tequin [Gatifloxacin] Other (See Comments)    Caused hypoglycemia Low blood sugar  . Brilinta [Ticagrelor] Rash    CAUSES PETECHIAE, PURPURA  . Pseudoephedrine Hcl Palpitations  . Septra [Sulfamethoxazole-Trimethoprim] Rash  . Sulfamethoxazole-Trimethoprim Rash    Consultations:  Cardiology   Procedures/Studies: Dg Chest 2 View  Result Date: 10/13/2018 CLINICAL DATA:  Acute onset of shortness of breath and left arm pain. EXAM: CHEST - 2 VIEW COMPARISON:  Chest radiograph performed 07/17/2018 FINDINGS: The lungs are well-aerated. Mild vascular congestion is noted. There is no evidence of focal opacification, pleural effusion or pneumothorax. The heart is mildly enlarged. The patient is status post median sternotomy, with evidence of prior CABG. No acute osseous abnormalities are seen. IMPRESSION: Mild vascular congestion and mild cardiomegaly. Lungs remain grossly clear. Electronically Signed   By: Garald Balding M.D.   On: 10/13/2018 22:22   Nm Myocar Multi W/spect W/wall Motion / Ef  Result Date: 10/15/2018  Defect 1: There is a medium defect of severe severity present in the basal anteroseptal, basal inferoseptal, mid anteroseptal, mid inferoseptal and apical septal location.  Findings consistent with prior myocardial infarction.  This is an intermediate risk study.  There was no ST segment deviation noted during stress.  T wave inversion was noted at rest in the I and aVL leads. T wave inversion persisted and  were unchanged.  There is absence of perfusion in the ventricular septum from apex to base, with no reversibility. Suggests previous infarction. No ischemia noted. EF visually appears to be 50%. Akinesis due to absent tracer uptake in ventricular septum, remainder of wall motion is grossly normal.    Discharge Exam: Vitals:   10/15/18 0950 10/15/18 1103  BP: (!) 137/40 118/60  Pulse:    Resp:  16  Temp:  98.2 F (36.8 C)  SpO2:     Vitals:   10/15/18 0946 10/15/18 0948 10/15/18 0950 10/15/18 1103  BP: (!) 141/44 (!) 140/44 (!) 137/40 118/60  Pulse:      Resp:    16  Temp:    98.2 F (36.8 C)  TempSrc:    Oral  SpO2:  Weight:      Height:        General: Pt is alert, awake, not in acute distress Cardiovascular: RRR, S1/S2 +, no rubs, no gallops Respiratory: CTA bilaterally, no wheezing, no rhonchi Abdominal: Soft, NT, ND, bowel sounds + Extremities: no edema, no cyanosis  The results of significant diagnostics from this hospitalization (including imaging, microbiology, ancillary and laboratory) are listed below for reference.     Microbiology: Recent Results (from the past 240 hour(s))  SARS Coronavirus 2 (CEPHEID - Performed in [place] hospital lab), Hosp Order     Status: None   Collection Time: 10/14/18 12:14 AM   Specimen: Nasopharyngeal Swab  Result Value Ref Range Status   SARS Coronavirus 2 NEGATIVE NEGATIVE Final    Comment: (NOTE) If result is NEGATIVE SARS-CoV-2 target nucleic acids are NOT DETECTED. The SARS-CoV-2 RNA is generally detectable in upper and lower  respiratory specimens during the acute phase of infection. The lowest  concentration of SARS-CoV-2 viral copies this assay can detect is 250  copies / mL. A negative result does not preclude SARS-CoV-2 infection  and should not be used as the sole basis for treatment or other  patient management decisions.  A negative result may occur with  improper specimen collection / handling, submission  of specimen other  than nasopharyngeal swab, presence of viral mutation(s) within the  areas targeted by this assay, and inadequate number of viral copies  (<250 copies / mL). A negative result must be combined with clinical  observations, patient history, and epidemiological information. If result is POSITIVE SARS-CoV-2 target nucleic acids are DETECTED. The SARS-CoV-2 RNA is generally detectable in upper and lower  respiratory specimens dur ing the acute phase of infection.  Positive  results are indicative of active infection with SARS-CoV-2.  Clinical  correlation with patient history and other diagnostic information is  necessary to determine patient infection status.  Positive results do  not rule out bacterial infection or co-infection with other viruses. If result is PRESUMPTIVE POSTIVE SARS-CoV-2 nucleic acids MAY BE PRESENT.   A presumptive positive result was obtained on the submitted specimen  and confirmed on repeat testing.  While 2019 novel coronavirus  (SARS-CoV-2) nucleic acids may be present in the submitted sample  additional confirmatory testing may be necessary for epidemiological  and / or clinical management purposes  to differentiate between  SARS-CoV-2 and other Sarbecovirus currently known to infect humans.  If clinically indicated additional testing with an alternate test  methodology [phone]) is advised. The SARS-CoV-2 RNA is generally  detectable in upper and lower respiratory sp ecimens during the acute  phase of infection. The expected result is Negative. Fact Sheet for Patients:  StrictlyIdeas.no Fact Sheet for Healthcare Providers: BankingDealers.co.za This test is not yet approved or cleared by the [place] FDA and has been authorized for detection and/or diagnosis of SARS-CoV-2 by FDA under an Emergency Use Authorization (EUA).  This EUA will remain in effect (meaning this test can be used) for  the duration of the COVID-19 declaration under Section 564(b)(1) of the Act, 21 U.S.C. section 360bbb-3(b)(1), unless the authorization is terminated or revoked sooner. Performed at [facility], [place] [address] [place],  [address]   MRSA PCR Screening     Status: None   Collection Time: 10/14/18  1:49 AM   Specimen: Nasal Mucosa; Nasopharyngeal  Result Value Ref Range Status   MRSA by PCR NEGATIVE NEGATIVE Final    Comment:  The GeneXpert MRSA Assay (FDA approved for NASAL specimens only), is one component of a comprehensive MRSA colonization surveillance program. It is not intended to diagnose MRSA infection nor to guide or monitor treatment for MRSA infections. Performed at [facility], [place] [address] [place], [place] [address]      Labs: BNP (last 3 results) Recent Labs    10/13/18 2040  BNP 518.8*   Basic Metabolic Panel: Recent Labs  Lab 10/13/18 2040 10/14/18 0229 10/15/18 0226  NA 138 140 139  K 4.1 4.2 3.8  CL 106 110 107  CO2 21* 19* 22  GLUCOSE 98 84 85  BUN 31* 28* 22  CREATININE 1.62* 1.40* 1.34*  CALCIUM 8.7* 8.6* 8.9   Liver Function Tests: No results for input(s): AST, ALT, ALKPHOS, BILITOT, PROT, ALBUMIN in the last 168 hours. No results for input(s): LIPASE, AMYLASE in the last 168 hours. No results for input(s): AMMONIA in the last 168 hours. CBC: Recent Labs  Lab 10/13/18 2040  WBC 11.4*  NEUTROABS 8.3*  HGB 11.3*  HCT 35.8*  MCV 97.3  PLT 253   Cardiac Enzymes: No results for input(s): CKTOTAL, CKMB, CKMBINDEX, TROPONINI in the last 168 hours. BNP: Invalid input(s): POCBNP CBG: No results for input(s): GLUCAP in the last 168 hours. D-Dimer No results for input(s): DDIMER in the last 72 hours. Hgb A1c No results for input(s): HGBA1C in the last 72 hours. Lipid Profile No results for input(s): CHOL, HDL, LDLCALC, TRIG, CHOLHDL, LDLDIRECT in the last 72 hours. Thyroid function studies No results  for input(s): TSH, T4TOTAL, T3FREE, THYROIDAB in the last 72 hours.  Invalid input(s): FREET3 Anemia work up No results for input(s): VITAMINB12, FOLATE, FERRITIN, TIBC, IRON, RETICCTPCT in the last 72 hours. Urinalysis    Component Value Date/Time   COLORURINE YELLOW 07/17/2018 2204   APPEARANCEUR CLEAR 07/17/2018 2204   LABSPEC 1.009 07/17/2018 2204   PHURINE 5.0 07/17/2018 2204   GLUCOSEU NEGATIVE 07/17/2018 2204   HGBUR NEGATIVE 07/17/2018 [place] 07/17/2018 [place] 07/17/2018 2204   PROTEINUR NEGATIVE 07/17/2018 2204   NITRITE NEGATIVE 07/17/2018 2204   LEUKOCYTESUR NEGATIVE 07/17/2018 2204   Sepsis Labs Invalid input(s): PROCALCITONIN,  WBC,  LACTICIDVEN Microbiology Recent Results (from the past 240 hour(s))  SARS Coronavirus 2 (CEPHEID - Performed in [place] hospital lab), Hosp Order     Status: None   Collection Time: 10/14/18 12:14 AM   Specimen: Nasopharyngeal Swab  Result Value Ref Range Status   SARS Coronavirus 2 NEGATIVE NEGATIVE Final    Comment: (NOTE) If result is NEGATIVE SARS-CoV-2 target nucleic acids are NOT DETECTED. The SARS-CoV-2 RNA is generally detectable in upper and lower  respiratory specimens during the acute phase of infection. The lowest  concentration of SARS-CoV-2 viral copies this assay can detect is 250  copies / mL. A negative result does not preclude SARS-CoV-2 infection  and should not be used as the sole basis for treatment or other  patient management decisions.  A negative result may occur with  improper specimen collection / handling, submission of specimen other  than nasopharyngeal swab, presence of viral mutation(s) within the  areas targeted by this assay, and inadequate number of viral copies  (<250 copies / mL). A negative result must be combined with clinical  observations, patient history, and epidemiological information. If result is POSITIVE SARS-CoV-2 target nucleic acids are  DETECTED. The SARS-CoV-2 RNA is generally detectable in upper and lower  respiratory specimens dur ing the acute phase of infection.  Positive  results are indicative of active infection with SARS-CoV-2.  Clinical  correlation with patient history and other diagnostic information is  necessary to determine patient infection status.  Positive results do  not rule out bacterial infection or co-infection with other viruses. If result is PRESUMPTIVE POSTIVE SARS-CoV-2 nucleic acids MAY BE PRESENT.   A presumptive positive result was obtained on the submitted specimen  and confirmed on repeat testing.  While 2019 novel coronavirus  (SARS-CoV-2) nucleic acids may be present in the submitted sample  additional confirmatory testing may be necessary for epidemiological  and / or clinical management purposes  to differentiate between  SARS-CoV-2 and other Sarbecovirus currently known to infect humans.  If clinically indicated additional testing with an alternate test  methodology [phone]) is advised. The SARS-CoV-2 RNA is generally  detectable in upper and lower respiratory sp ecimens during the acute  phase of infection. The expected result is Negative. Fact Sheet for Patients:  StrictlyIdeas.no Fact Sheet for Healthcare Providers: BankingDealers.co.za This test is not yet approved or cleared by the [place] FDA and has been authorized for detection and/or diagnosis of SARS-CoV-2 by FDA under an Emergency Use Authorization (EUA).  This EUA will remain in effect (meaning this test can be used) for the duration of the COVID-19 declaration under Section 564(b)(1) of the Act, 21 U.S.C. section 360bbb-3(b)(1), unless the authorization is terminated or revoked sooner. Performed at [facility], [place] [address], [place], [place] [address]   MRSA PCR Screening     Status: None   Collection Time: 10/14/18  1:49 AM   Specimen: Nasal Mucosa;  Nasopharyngeal  Result Value Ref Range Status   MRSA by PCR NEGATIVE NEGATIVE Final    Comment:        The GeneXpert MRSA Assay (FDA approved for NASAL specimens only), is one component of a comprehensive MRSA colonization surveillance program. It is not intended to diagnose MRSA infection nor to guide or monitor treatment for MRSA infections. Performed at [facility], [place] [address] [place], [place] [address]      Time coordinating discharge: 30 minutes  SIGNED:  Chipper Oman, MD  [facility] 10/15/2018, 2:46 PM  Pager please text page via  [email or web address]  Note - This record has been created using [employer]. Chart creation errors have been sought, but may not always have been located. Such creation errors do not reflect on the standard of medical care.

## 2018-10-15 NOTE — Progress Notes (Addendum)
   Myoview completed - awaiting results.  Did OK o/n - just not much sleep.   Some arm/shoulder squeezing this AM.  Will wait for Myoview Results (should be read soon) --if non-ischemic, OK to d/c    Glenetta Hew c   ADDENDUM - Myoview read - clear evidence of prior infarct, but NO evidence of Ischemia.  OK to d/c home    [place] will sign off.   Medication Recommendations:  Home meds Other recommendations (labs, testing, etc):  none Follow up as an outpatient:  Will arrange f/u.

## 2018-10-15 NOTE — TOC Initial Note (Signed)
Transition of Care [facility]) - Initial/Assessment Note    Patient Details  Name: Erin Ingram MRN: [id number] Date of Birth: 10/01/1940  Transition of [facility]) CM/SW Contact:    Gabrielle Dare Phone Number: 10/15/2018, 4:31 PM  Clinical Narrative:    CSW met with patient at bedside, introduced role.  Patient is agreeable to receive PT services at [facility] at [place].  She stated that she and spouse have lived at [facility] since 2011.  The pt and spouse moved to ALF 2 days prior to pt's being admitted to hospital.  Pt has not further concerns.  CSW will continue to follow patient for discharge to [place] [facility] [place].             Expected Discharge Plan: Skilled Nursing Facility Barriers to Discharge: No Barriers Identified   Patient Goals and CMS Choice Patient states their goals for this hospitalization and ongoing recovery are:: "walk without being out of breath      Expected Discharge Plan and Services Expected Discharge Plan: [place] arrangements for the past 2 months: Assisted Living Facility(2 days) Expected Discharge Date: 10/15/18                                    Prior Living Arrangements/Services Living arrangements for the past 2 months: Assisted Living Facility(2 days) Lives with:: Spouse Patient language and need for interpreter reviewed:: No Do you feel safe going back to the place where you live?: Yes      Need for Family Participation in Patient Care: Yes (Comment) Care giver support system in place?: Yes (comment)   Criminal Activity/Legal Involvement Pertinent to Current Situation/Hospitalization: No - Comment as needed  Activities of Daily Living Home Assistive Devices/Equipment: Gilford Rile (specify type) ADL Screening (condition at time of admission) Patient's cognitive ability adequate to safely complete daily activities?: Yes Is the patient deaf or have difficulty  hearing?: No Does the patient have difficulty seeing, even when wearing glasses/contacts?: No Does the patient have difficulty concentrating, remembering, or making decisions?: No Patient able to express need for assistance with ADLs?: Yes Does the patient have difficulty dressing or bathing?: No Independently performs ADLs?: Yes (appropriate for developmental age) Does the patient have difficulty walking or climbing stairs?: No Weakness of Legs: None Weakness of Arms/Hands: None  Permission Sought/Granted Permission sought to share information with : Facility Sport and exercise psychologist, Family Supports Permission granted to share information with : Yes, Verbal Permission Granted  Share Information with NAME: Erin Ingram  Permission granted to share info w AGENCY: [facility] granted to share info w Relationship: Spouse  Permission granted to share info w Contact Information: yes  Emotional Assessment Appearance:: Appears stated age Attitude/Demeanor/Rapport: Engaged, Gracious Affect (typically observed): Appropriate, Accepting, Hopeful Orientation: : Oriented to Self, Oriented to Place, Oriented to  Time, Oriented to Situation Alcohol / Substance Use: Not Applicable Psych Involvement: No (comment)  Admission diagnosis:  Chest pain, rule out acute myocardial infarction [R07.9] Patient Active Problem List   Diagnosis Date Noted  . Chest pain, rule out acute myocardial infarction 10/14/2018  . Weight gain 08/18/2018  . Hypocalcemia 07/20/2018  . Candidiasis 07/20/2018  . Syncope 07/18/2018  . Kidney disease, chronic, stage III (moderate, EGFR 30-59 ml/min) ([place]) 06/10/2018  . Constipation 06/02/2018  . Allergic rhinitis 06/02/2018  .  Osteoporosis 06/02/2018  . Tremor 06/15/2017  . Gait abnormality 06/15/2017  . Polymyalgia rheumatica ([place]) 06/11/2017  . Fall 06/11/2017  . Visual hallucination 06/10/2017  . GERD (gastroesophageal reflux disease)  06/10/2017  . Hypothyroidism 06/10/2017  . Abnormal movement 06/10/2017  . Dysphagia 07/27/2016  . Irritable bowel syndrome with diarrhea 07/27/2016  . Unstable angina pectoris ([place]) 04/10/2016  . Bilateral lower extremity edema 03/13/2016  . (HFpEF) heart failure with preserved ejection fraction ([place]) 10/08/2015  . CAD (coronary artery disease) 10/08/2015  . 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 Hyperlipidemia LDL goal <70   . PAD (peripheral artery disease) ([place]) 04/11/2015  . Essential hypertension 04/11/2014  . Morbid obesity ([place]) 03/22/2013  . Anemia, unspecified 03/22/2013  . Depression, recurrent ([place]) 03/22/2013  . Generalized osteoarthrosis, unspecified site 03/22/2013  . Sleep apnea 03/22/2013  . Impaired fasting glucose 03/22/2013  . 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 Advanced care planning/counseling discussion 03/22/2013   PCP:  Mast, Man X, NP Pharmacy:  No Pharmacies Listed    Social Determinants of Health (SDOH) Interventions    Readmission Risk Interventions No flowsheet data found.

## 2018-10-15 NOTE — Progress Notes (Addendum)
Pt left unit with PTar team on stretcher.

## 2018-10-15 NOTE — NC FL2 (Signed)
[place] LEVEL OF CARE SCREENING TOOL     IDENTIFICATION  Patient Name: Erin Ingram Birthdate: Sep 07, 1940 Sex: female Admission Date (Current Location): 10/13/2018  [facility] [place] Number:  Herbalist and Address:         Provider Number: [id number]  Attending Physician Name and Address:  Doreatha Lew, MD  Relative Name and Phone Number:  Erin Ingram [phone]    Current Level of Care: Hospital Recommended Level of Care: [place] Prior Approval Number:    Date Approved/Denied:   PASRR Number: [id number]  Discharge Plan: SNF    Current Diagnoses: Patient Active Problem List   Diagnosis Date Noted  . Chest pain, rule out acute myocardial infarction 10/14/2018  . Weight gain 08/18/2018  . Hypocalcemia 07/20/2018  . Candidiasis 07/20/2018  . Syncope 07/18/2018  . Kidney disease, chronic, stage III (moderate, EGFR 30-59 ml/min) ([place]) 06/10/2018  . Constipation 06/02/2018  . Allergic rhinitis 06/02/2018  . Osteoporosis 06/02/2018  . Tremor 06/15/2017  . Gait abnormality 06/15/2017  . Polymyalgia rheumatica ([place]) 06/11/2017  . Fall 06/11/2017  . Visual hallucination 06/10/2017  . GERD (gastroesophageal reflux disease) 06/10/2017  . Hypothyroidism 06/10/2017  . Abnormal movement 06/10/2017  . Dysphagia 07/27/2016  . Irritable bowel syndrome with diarrhea 07/27/2016  . Unstable angina pectoris ([place]) 04/10/2016  . Bilateral lower extremity edema 03/13/2016  . (HFpEF) heart failure with preserved ejection fraction ([place]) 10/08/2015  . CAD (coronary artery disease) 10/08/2015  . Subclavian arterial stenosis ([place])   . Hyperlipidemia LDL goal <70   . PAD (peripheral artery disease) ([place]) 04/11/2015  . Essential hypertension 04/11/2014  . Morbid obesity ([place]) 03/22/2013  . Anemia, unspecified 03/22/2013  . Depression, recurrent ([place]) 03/22/2013  . Generalized osteoarthrosis, unspecified site 03/22/2013  .  Sleep apnea 03/22/2013  . Impaired fasting glucose 03/22/2013  . 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 Advanced care planning/counseling discussion 03/22/2013    Orientation RESPIRATION BLADDER Height & Weight     Self, Time, Situation, Place  Normal, Other (Comment)(CPAP) Continent Weight: 195 lb 15.8 oz (88.9 kg) Height:  '4\' 10"'$  (147.3 cm)  BEHAVIORAL SYMPTOMS/MOOD NEUROLOGICAL BOWEL NUTRITION STATUS      Continent Diet(NPO)  AMBULATORY STATUS COMMUNICATION OF NEEDS Skin   Limited Assist Verbally Normal                       Personal Care Assistance Level of Assistance  Bathing, Feeding, Dressing Bathing Assistance: Independent Feeding assistance: Independent Dressing Assistance: Limited assistance     Functional Limitations Info  Sight, Hearing, Speech Sight Info: Adequate Hearing Info: Adequate Speech Info: Adequate    SPECIAL CARE FACTORS FREQUENCY  PT (By licensed PT)     PT Frequency: 5 times a week              Contractures Contractures Info: Not present    Additional Factors Info  Allergies, Code Status Code Status Info: DNR Allergies Info: Nsaids, Butorphanol, Sulfa Antibiotics, Bisoprolol Fumarate, Butorphanol Tartrate, Codeine, Demerol Meperidine, Imipramine, Meperidine And Related, Statins, Tequin Gatifloxacin, Brilinta Ticagrelor, Pseudoephedrine Hcl, Septra Sulfamethoxazole-trimethoprim, Sulfamethoxazole-trimethoprim           Current Medications (10/15/2018):  This is the current hospital active medication list Current Facility-Administered Medications  Medication Dose Route Frequency Provider Last Rate Last Dose  . acetaminophen (TYLENOL) tablet 650 mg  650 mg Oral Q4H PRN Etta Quill, DO   650 mg at 10/14/18 0973  . atorvastatin (LIPITOR)  tablet 10 mg  10 mg Oral Daily Jennette Kettle M, DO   10 mg at 10/15/18 1113  . calcium carbonate (OS-CAL - dosed in mg of elemental calcium) tablet 625 mg  625 mg Oral BID WC Jennette Kettle M, DO    625 mg at 10/15/18 1113  . cholecalciferol (VITAMIN D3) tablet 2,000 Units  2,000 Units Oral Daily Etta Quill, DO   2,000 Units at 10/15/18 1113  . clopidogrel (PLAVIX) tablet 75 mg  75 mg Oral Daily Jennette Kettle M, DO   75 mg at 10/15/18 1113  . enoxaparin (LOVENOX) injection 30 mg  30 mg Subcutaneous Q24H Jennette Kettle M, DO   30 mg at 10/15/18 0544  . fluticasone (FLONASE) 50 MCG/ACT nasal spray 2 spray  2 spray Each Nare Daily PRN Etta Quill, DO      . isosorbide mononitrate (IMDUR) 24 hr tablet 120 mg  120 mg Oral Daily Jennette Kettle M, DO   120 mg at 10/15/18 1113  . levothyroxine (SYNTHROID) tablet 75 mcg  75 mcg Oral Q0600 Etta Quill, DO   75 mcg at 10/15/18 0544  . loperamide (IMODIUM) capsule 2 mg  2 mg Oral PRN Manuella Ghazi, Pratik D, DO   2 mg at 10/14/18 1820  . loratadine (CLARITIN) tablet 10 mg  10 mg Oral QHS Jennette Kettle M, DO   10 mg at 10/14/18 2149  . metoprolol succinate (TOPROL-XL) 24 hr tablet 75 mg  75 mg Oral Daily Jennette Kettle M, DO   75 mg at 10/15/18 1114  . mirabegron ER (MYRBETRIQ) tablet 50 mg  50 mg Oral Daily Jennette Kettle M, DO   50 mg at 10/15/18 1114  . nystatin (MYCOSTATIN/NYSTOP) topical powder 100,000 g  100,000 g Topical BID Jennette Kettle M, DO   100,000 g at 10/15/18 1115  . ondansetron (ZOFRAN) injection 4 mg  4 mg Intravenous Q6H PRN Etta Quill, DO      . pantoprazole (PROTONIX) EC tablet 40 mg  40 mg Oral Daily Jennette Kettle M, DO   40 mg at 10/15/18 1113  . prednisoLONE tablet 7.5 mg  7.5 mg Oral Once per day on Mon Tue Thu Sat Einar Grad, RPH   7.5 mg at 10/15/18 1115  . [START ON 10/16/2018] predniSONE (DELTASONE) tablet 5 mg  5 mg Oral Once per day on Sun Wed Gardner, Jared M, DO      . ranolazine (RANEXA) 12 hr tablet 1,000 mg  1,000 mg Oral BID Etta Quill, DO   1,000 mg at 10/15/18 1112  . regadenoson (LEXISCAN) 0.4 MG/5ML injection SOLN           . venlafaxine XR (EFFEXOR-XR) 24 hr capsule 225 mg  225 mg  Oral Q breakfast Etta Quill, DO   225 mg at 10/14/18 6190     Discharge Medications: Please see discharge summary for a list of discharge medications.  Relevant Imaging Results:  Relevant Lab Results:   Additional Information SS# [id number]  Erin Ingram [place], [place]

## 2018-10-15 NOTE — Progress Notes (Signed)
Report called in and given to [facility] at [facility].

## 2018-10-15 NOTE — Evaluation (Addendum)
Physical Therapy Evaluation Patient Details Name: Erin Ingram MRN: [id number] DOB: 16-May-1940 Today's Date: 10/15/2018   History of Present Illness  78 y.o. female with medical history significant of CAD s/p CABG 2000, CKD stage 3, chronic diastolic CHF, OSA on CPAP, HTN, stable angina. She presented to the ED with c/o  left upper chest and arm pain.    Clinical Impression  PT eval complete. Pt required supervision transfers and min guard assist ambulation 200 feet with RW. Plan is for d/c back to ALF today. Recommend PT at [facility]. PT signing off.    Follow Up Recommendations Home health PT    Equipment Recommendations  None recommended by PT    Recommendations for Other Services       Precautions / Restrictions Precautions Precautions: Fall      Mobility  Bed Mobility Overal bed mobility: Modified Independent                Transfers Overall transfer level: Needs assistance Equipment used: Ambulation equipment used Transfers: Sit to/from Stand;Stand Pivot Transfers Sit to Stand: Supervision Stand pivot transfers: Supervision       General transfer comment: supervision for safety  Ambulation/Gait Ambulation/Gait assistance: Min guard Gait Distance (Feet): 200 Feet Assistive device: Rolling walker (2 wheeled) Gait Pattern/deviations: Step-through pattern;Decreased stride length Gait velocity: decreased Gait velocity interpretation: 1.31 - 2.62 ft/sec, indicative of limited community ambulator General Gait Details: steady gait. Return of LUE pain during ambulation with RW.  Stairs            Wheelchair Mobility    Modified Rankin (Stroke Patients Only)       Balance Overall balance assessment: Mild deficits observed, not formally tested                                           Pertinent Vitals/Pain Pain Assessment: 0-10 Pain Score: 3  Pain Location: LUE during ambulation Pain Descriptors / Indicators:  Squeezing Pain Intervention(s): Monitored during session;Repositioned;Limited activity within patient's tolerance    Home Living Family/patient expects to be discharged to:: Assisted living               Home Equipment: Walker - 4 wheels      Prior Function Level of Independence: Independent with assistive device(s)         Comments: walked to dining room at facility, independent ADLs     Hand Dominance        Extremity/Trunk Assessment   Upper Extremity Assessment Upper Extremity Assessment: Generalized weakness    Lower Extremity Assessment Lower Extremity Assessment: Generalized weakness    Cervical / Trunk Assessment Cervical / Trunk Assessment: Kyphotic  Communication   Communication: No difficulties  Cognition Arousal/Alertness: Awake/alert Behavior During Therapy: WFL for tasks assessed/performed Overall Cognitive Status: Within Functional Limits for tasks assessed                                        General Comments      Exercises     Assessment/Plan    PT Assessment All further PT needs can be met in the next venue of care  PT Problem List Decreased strength;Decreased mobility;Decreased activity tolerance;Decreased balance;Pain       PT Treatment Interventions      PT Goals (Current  goals can be found in the Care Plan section)  Acute Rehab PT Goals Patient Stated Goal: home today PT Goal Formulation: All assessment and education complete, DC therapy    Frequency     Barriers to discharge        Co-evaluation               AM-PAC PT "6 Clicks" Mobility  Outcome Measure Help needed turning from your back to your side while in a flat bed without using bedrails?: None Help needed moving from lying on your back to sitting on the side of a flat bed without using bedrails?: None Help needed moving to and from a bed to a chair (including a wheelchair)?: None Help needed standing up from a chair using your arms  (e.g., wheelchair or bedside chair)?: None Help needed to walk in hospital room?: A Little Help needed climbing 3-5 steps with a railing? : A Lot 6 Click Score: 21    End of Session Equipment Utilized During Treatment: Gait belt Activity Tolerance: Patient tolerated treatment well Patient left: in chair;with call bell/phone within reach Nurse Communication: Mobility status PT Visit Diagnosis: Difficulty in walking, not elsewhere classified (R26.2);Pain;Muscle weakness (generalized) (M62.81) Pain - Right/Left: Left Pain - part of body: Arm    Time: 7619-5093 PT Time Calculation (min) (ACUTE ONLY): 25 min   Charges:   PT Evaluation $PT Eval Low Complexity: 1 Low PT Treatments $Gait Training: 8-22 mins        Lorrin Goodell, PT  Office # [phone] Pager [phone]   [name] 10/15/2018, 2:59 PM

## 2018-10-15 NOTE — TOC Transition Note (Signed)
Transition of Care [facility]) - CM/SW Discharge Note   Patient Details  Name: IMMACULATE CRUTCHER MRN: [id number] Date of Birth: [birth year]  Transition of Care [facility]) CM/SW Contact:  Gabrielle Dare Phone Number: 10/15/2018, 3:57 PM   Clinical Narrative:  Patient will Discharge To: [facility] [employer] Anticipated [place] Date:10/15/2018 Family Notified: left vm on Gwendoline Judy phone [phone] Transport By: Corey Harold   Per MD patient ready for DC to [place] [facility] [place] . RN, patient, patient's family, and facility notified of DC. Assessment, Fl2/Pasrr, and Discharge Summary sent to facility. RN given number for report [phone] Ext [phone] ask for [facility] DC packet on chart. Ambulance transport requested for patient.   CSW signing off.  Reed Breech LCSWA [phone]      Final next level of care: Skilled Nursing Facility Barriers to Discharge: No Barriers Identified   Patient Goals and CMS Choice        Discharge Placement              Patient chooses bed at: [facility] Patient to be transferred to facility by: [place] Name of family member notified: Treyana Sturgell Patient and family notified of of transfer: 10/15/18  Discharge Plan and Services                                     Social Determinants of Health (SDOH) Interventions     Readmission Risk Interventions No flowsheet data found.

## 2018-10-17 ENCOUNTER — Encounter: Payer: Self-pay | Admitting: Nurse Practitioner

## 2018-10-17 ENCOUNTER — Non-Acute Institutional Stay (SKILLED_NURSING_FACILITY): Payer: Medicare Other | Admitting: Nurse Practitioner

## 2018-10-17 DIAGNOSIS — F5101 Primary insomnia: Secondary | ICD-10-CM

## 2018-10-17 DIAGNOSIS — I25110 Atherosclerotic heart disease of native coronary artery with unstable angina pectoris: Secondary | ICD-10-CM

## 2018-10-17 DIAGNOSIS — I200 Unstable angina: Secondary | ICD-10-CM

## 2018-10-17 DIAGNOSIS — M353 Polymyalgia rheumatica: Secondary | ICD-10-CM

## 2018-10-17 DIAGNOSIS — G4700 Insomnia, unspecified: Secondary | ICD-10-CM | POA: Insufficient documentation

## 2018-10-17 DIAGNOSIS — I10 Essential (primary) hypertension: Secondary | ICD-10-CM | POA: Diagnosis not present

## 2018-10-17 DIAGNOSIS — K219 Gastro-esophageal reflux disease without esophagitis: Secondary | ICD-10-CM

## 2018-10-17 DIAGNOSIS — G4733 Obstructive sleep apnea (adult) (pediatric): Secondary | ICD-10-CM

## 2018-10-17 NOTE — Assessment & Plan Note (Signed)
Continue CPAp

## 2018-10-17 NOTE — Assessment & Plan Note (Signed)
Desires to resume home med Melatonin 1mg  hs prn.

## 2018-10-17 NOTE — Assessment & Plan Note (Signed)
F/u cardiology, continue Isosorbide, Ranexa, Plavix, Atorvastatin, f/u cardiology, observe.

## 2018-10-17 NOTE — Assessment & Plan Note (Signed)
Stable, continue Prednisone.  

## 2018-10-17 NOTE — Assessment & Plan Note (Signed)
Stable, continue Protonix 

## 2018-10-17 NOTE — Progress Notes (Signed)
Location:   S[facility] [place] Room Number: 884 Place of Service:  SNF (31) Provider: Lennie Odor Katharin Schneider NP  Akeel Reffner X, NP  Patient Care Team: Theon Sobotka X, NP as PCP - General (Internal Medicine) Jettie Booze, MD as PCP - Cardiology (Cardiology) Wilford Corner, MD as Consulting Physician (Gastroenterology) Marlou Sa, Tonna Corner, MD as Consulting Physician (Orthopedic Surgery) Marcial Pacas, MD as Consulting Physician (Neurology) Franchot Gallo, MD as Consulting Physician (Urology) Kary Kos, MD as Consulting Physician (Neurosurgery)  Extended Emergency Contact Information Primary Emergency Contact: Merlinda Frederick Address: [address]          [place] [address] [place] Phone: [phone] Mobile Phone: [phone] Relation: Spouse Secondary Emergency Contact: [place] Mobile Phone: [phone] Relation: Son  Code Status: DNR Goals of care: Advanced Directive information Advanced Directives 10/17/2018  Does Patient Have a Medical Advance Directive? Yes  Type of Advance Directive Out of facility DNR (pink MOST or yellow form)  Does patient want to make changes to medical advance directive? No - Patient declined  Copy of [place] in Chart? -  Would patient like information on creating a medical advance directive? No - Patient declined  Pre-existing out of facility DNR order (yellow form or pink MOST form) Yellow form placed in chart (order not valid for inpatient use);Pink MOST form placed in chart (order not valid for inpatient use)     Chief Complaint  Patient presents with   Acute Visit    Insomnia     HPI:  Pt is a 78 y.o. female seen today for an acute visit for the patient was hospitalized 10/13/18-10/15/18 for angina, Hx of CAD s/p CABG 2000, CHF, OSA on CPAP.  f/u CBC/BMP/lipids pending. F/u Cardiology as outpatient. Mild elevated BNP, normal Troponin and negative CXR, negative nuclear stress  test for acute ischemia. The patient stated she is having angina upon my visit today for c/o insomnia. She stated she knew her angina is there in the left neck and shoulder, onset is gradual, lasted for a few hours now. She denied chest pain/pressure, palpitation, SOB, sense of impending doom, or diaphoresis. She denied headache, dizziness, change of vision, or focal weakness. She denied NTG or analgesics. She desires to watchful waiting. Hx of CAD, on Isorsobide 120mg  qd, Plavix 75mg  qd, Ranexa 1000mg  bid, prn NTG. GERD, stable on Pantoprazole 40mg  qd. Polymyalgia rheumatica, on Prednisone 5mg  qd. HTN, blood pressure is controlled on Metoprolol 50mg  qd.   Past Medical History:  Diagnosis Date   Anemia    Arthritis    "fingers, back, shoulders, hips" (04/10/2016)   Chronic diastolic CHF (congestive heart failure) (HCC)    a. normal EF, LVEDP at [facility] in 6/17 33 >> Lasix started    CKD (chronic kidney disease), stage III (HCC)    Coronary artery disease    a. s/p CABG 2000 (SVG/ free LIMA Y graft to the diagonal and distal LAD, SVG to the OM 1, and SVG to the PDA) . b. Cath 12/19/2014 90% dSVG to RCA s/p 2 overlapping DES. c. LHC 2016, 2017 no intervention except diuresis needed. d. Low risk nuc 03/2016. e. Cath 12/2017 patent LIMA-LAD, SVG-OM, SVG-PDA but occluded SVG to diag.    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 L by cath note in 2016.   TIA (transient ischemic attack)    Past Surgical History:  Procedure Laterality Date   BREAST BIOPSY Right 1964   CARDIAC CATHETERIZATION  08   patent grafts, no culprit lesions, EF 65%   CARDIAC CATHETERIZATION N/A 12/19/2014    Procedure: Left Heart Cath and Cors/Grafts Angiography;  Surgeon: Jettie Booze, MD;  Location: [facility];  Service: Cardiovascular;  Laterality: N/A;   CARDIAC CATHETERIZATION N/A 12/19/2014   Procedure: Coronary Stent Intervention;  Surgeon: Jettie Booze, MD;  Location: [facility];  Service: Cardiovascular;  Laterality: N/A;   CARDIAC CATHETERIZATION N/A 01/01/2015   Procedure: Left Heart Cath and Cors/Grafts Angiography;  Surgeon: Jettie Booze, MD;  Location: [facility];  Service: Cardiovascular;  Laterality: N/A;   CARDIAC CATHETERIZATION N/A 09/20/2015   Procedure: Left Heart Cath and Cors/Grafts Angiography;  Surgeon: Jettie Booze, MD;  Location: [facility];  Service: Cardiovascular;  Laterality: N/A;   CATARACT EXTRACTION W/ INTRAOCULAR LENS  IMPLANT, BILATERAL Bilateral 2011   COLONOSCOPY WITH PROPOFOL N/A 07/27/2016   Procedure: COLONOSCOPY WITH PROPOFOL;  Surgeon: Wilford Corner, MD;  Location: [facility];  Service: Endoscopy;  Laterality: N/A;   CORONARY ARTERY BYPASS GRAFT  2000   ASCVD, multivessel, S./P.   DILATION AND CURETTAGE OF UTERUS  1980s   ESOPHAGOGASTRODUODENOSCOPY (EGD) WITH PROPOFOL N/A 07/27/2016   Procedure: ESOPHAGOGASTRODUODENOSCOPY (EGD) WITH PROPOFOL;  Surgeon: Wilford Corner, MD;  Location: [facility];  Service: Endoscopy;  Laterality: N/A;   FRACTURE SURGERY     LEFT HEART CATH AND CORS/GRAFTS ANGIOGRAPHY N/A 01/12/2018   Procedure: LEFT HEART CATH AND CORS/GRAFTS ANGIOGRAPHY;  Surgeon: Jettie Booze, MD;  Location: [facility];  Service: Cardiovascular;  Laterality: N/A;   PATELLA FRACTURE SURGERY Left 1993    Allergies  Allergen Reactions   Nsaids Other (See Comments)    Due to chronic kidney failure   Butorphanol Other (See Comments)    agitation Constipation Produced a lot of urine, made patient feel crazy   Sulfa Antibiotics Rash   Bisoprolol Fumarate  Other (See Comments)    Doesn't remember   Butorphanol Tartrate Other (See Comments)    Produced a lot of urine, made patient feel crazy   Codeine Nausea And Vomiting   Demerol [Meperidine] Nausea And Vomiting   Imipramine     Sweating, facial dysfunction    Meperidine And Related Nausea And Vomiting   Statins     MYALGIAS   Tequin [Gatifloxacin] Other (See Comments)    Caused hypoglycemia Low blood sugar   Brilinta [Ticagrelor] Rash    CAUSES PETECHIAE, PURPURA   Pseudoephedrine Hcl Palpitations   Septra [Sulfamethoxazole-Trimethoprim] Rash   Sulfamethoxazole-Trimethoprim Rash    Allergies as of 10/17/2018      Reactions   Nsaids Other (See Comments)   Due to chronic kidney failure   Butorphanol Other (See Comments)   agitation Constipation Produced a lot of urine, made patient feel crazy   Sulfa Antibiotics Rash   Bisoprolol Fumarate Other (See Comments)   Doesn't remember   Butorphanol Tartrate Other (See Comments)   Produced a lot of urine, made patient feel crazy   Codeine Nausea And Vomiting   Demerol [meperidine] Nausea And  Vomiting   Imipramine    Sweating, facial dysfunction    Meperidine And Related Nausea And Vomiting   Statins    MYALGIAS   Tequin [gatifloxacin] Other (See Comments)   Caused hypoglycemia Low blood sugar   Brilinta [ticagrelor] Rash   CAUSES PETECHIAE, PURPURA   Pseudoephedrine Hcl Palpitations   Septra [sulfamethoxazole-trimethoprim] Rash   Sulfamethoxazole-trimethoprim Rash      Medication List       Accurate as of October 17, 2018 12:50 PM. If you have any questions, ask your nurse or doctor.        alendronate 70 MG tablet Commonly known as: FOSAMAX Take 70 mg by mouth every Sunday.   atorvastatin 10 MG tablet Commonly known as: LIPITOR Take 10 mg by mouth daily.   calcium carbonate 600 MG Tabs tablet Commonly known as: OS-CAL Take 600 mg by mouth 2 (two) times daily with a meal.   clopidogrel 75 MG  tablet Commonly known as: PLAVIX TAKE 1 TABLET ONCE DAILY.   Coenzyme Q10 10 MG capsule Take 1 capsule (10 mg total) by mouth at bedtime.   fluticasone 50 MCG/ACT nasal spray Commonly known as: FLONASE Place 2 sprays into both nostrils daily as needed for allergies.   Glucosamine 1500 Complex Caps Take 2 capsules by mouth daily after breakfast.   isosorbide mononitrate 60 MG 24 hr tablet Commonly known as: IMDUR Take 2 tablets (120 mg total) by mouth daily.   levocetirizine 5 MG tablet Commonly known as: XYZAL TAKE 1 TABLET ONCE DAILY IN THE EVENING. What changed: See the new instructions.   levothyroxine 75 MCG tablet Commonly known as: SYNTHROID TAKE 1 TABLET ONCE DAILY ON EMPTY STOMACH. What changed: See the new instructions.   metoprolol succinate 50 MG 24 hr tablet Commonly known as: TOPROL-XL Take 1.5 tablets orally once a day. Take with or immediately following a meal. What changed:   how much to take  how to take this  when to take this  additional instructions   Myrbetriq 50 MG Tb24 tablet Generic drug: mirabegron ER TAKE 1 TABLET BY MOUTH DAILY. What changed: how much to take   nitroGLYCERIN 0.4 MG SL tablet Commonly known as: NITROSTAT Place 0.4 mg under the tongue every 5 (five) minutes as needed for chest pain. X 3 doses   nystatin powder Commonly known as: MYCOSTATIN/NYSTOP Apply 100,000 g topically 2 (two) times daily.   pantoprazole 40 MG tablet Commonly known as: PROTONIX Take 1 tablet (40 mg total) by mouth daily.   predniSONE 5 MG tablet Commonly known as: DELTASONE Take 5-7.5 mg by mouth See admin instructions. Take 1 tablet on Sunday and Wednesday then Take 1 and 1/2 tablets  all other days   predniSONE 5 MG tablet Commonly known as: DELTASONE Take 5 mg by mouth daily with breakfast. Once a day on Sun, wed   ranolazine 1000 MG SR tablet Commonly known as: RANEXA TAKE 1 TABLET BY MOUTH TWICE DAILY.   venlafaxine XR 75 MG 24 hr  capsule Commonly known as: EFFEXOR-XR TAKE 3 CAPSULES ONCE A DAY.   Vitamin D 50 MCG (2000 UT) tablet Take 2,000 Units by mouth daily.   zinc oxide 20 % ointment Apply 1 application topically as needed for irritation.       Review of Systems  Constitutional: Negative for activity change, appetite change, chills, diaphoresis, fatigue and fever.  HENT: Negative for congestion, hearing loss and voice change.   Respiratory: Positive for shortness of breath. Negative for  cough and wheezing.        DOE  Cardiovascular: Positive for leg swelling. Negative for chest pain and palpitations.  Gastrointestinal: Negative for abdominal distention, abdominal pain, constipation, diarrhea, nausea and vomiting.  Genitourinary: Negative for difficulty urinating, dysuria and urgency.  Musculoskeletal: Positive for arthralgias and gait problem.       The left neck and shoulder aches   Skin: Negative for color change and pallor.  Neurological: Negative for dizziness, facial asymmetry, speech difficulty, weakness and headaches.  Psychiatric/Behavioral: Positive for sleep disturbance. Negative for agitation, behavioral problems and hallucinations. The patient is not nervous/anxious.     Immunization History  Administered Date(s) Administered   HPV Bivalent 11/24/2013, 01/17/2016   Influenza Whole 12/23/2017   Influenza-Unspecified 01/10/2013, 12/27/2014, 12/30/2016   Pneumococcal Conjugate-13 04/21/2013   Pneumococcal Polysaccharide-23 02/27/1993, 03/23/2009   Tdap 04/20/2012   Zoster 11/10/2010   Pertinent  Health Maintenance Due  Topic Date Due   INFLUENZA VACCINE  10/22/2018   DEXA SCAN  Completed   PNA vac Low Risk Adult  Completed   Fall Risk  10/06/2018 08/18/2018 01/27/2018 12/07/2017  Falls in the past year? 1 1 1  Yes  Number falls in past yr: 0 0 1 2 or more  Injury with Fall? 0 1 0 No   Functional Status Survey:    Vitals:   10/17/18 1013  BP: (!) 112/52  Pulse: 70   Resp: 18  Temp: (!) 97.3 F (36.3 C)  SpO2: 94%  Weight: 197 lb 6.4 oz (89.5 kg)  Height: 4\' 10"  (1.473 m)   Body mass index is 41.26 kg/m. Physical Exam Constitutional:      General: She is not in acute distress.    Appearance: She is obese. She is not ill-appearing, toxic-appearing or diaphoretic.  HENT:     Head: Normocephalic and atraumatic.     Nose: Nose normal.     Mouth/Throat:     Mouth: Mucous membranes are moist.  Eyes:     Extraocular Movements: Extraocular movements intact.     Conjunctiva/sclera: Conjunctivae normal.     Pupils: Pupils are equal, round, and reactive to light.  Neck:     Musculoskeletal: Normal range of motion and neck supple. No neck rigidity or muscular tenderness.  Cardiovascular:     Rate and Rhythm: Normal rate and regular rhythm.     Heart sounds: No murmur.  Pulmonary:     Effort: Pulmonary effort is normal.     Breath sounds: No wheezing or rhonchi.  Abdominal:     General: Bowel sounds are normal.     Palpations: Abdomen is soft.     Tenderness: There is no abdominal tenderness. There is no right CVA tenderness, guarding or rebound.  Musculoskeletal:     Right lower leg: Edema present.     Left lower leg: Edema present.     Comments: Trace edema in ankles. Ambulates with walker.   Skin:    General: Skin is warm and dry.  Neurological:     General: No focal deficit present.     Mental Status: She is alert and oriented to person, place, and time. Mental status is at baseline.     Cranial Nerves: No cranial nerve deficit.     Motor: No weakness.     Coordination: Coordination normal.     Gait: Gait abnormal.  Psychiatric:        Mood and Affect: Mood normal.        Behavior: Behavior normal.  Thought Content: Thought content normal.        Judgment: Judgment normal.     Labs reviewed: Recent Labs    10/13/18 2040 10/14/18 0229 10/15/18 0226  NA 138 140 139  K 4.1 4.2 3.8  CL 106 110 107  CO2 21* 19* 22   GLUCOSE 98 84 85  BUN 31* 28* 22  CREATININE 1.62* 1.40* 1.34*  CALCIUM 8.7* 8.6* 8.9   Recent Labs    06/07/18 0710 07/17/18 2157 07/26/18  AST 10 14* 8*  ALT 7 10 7   ALKPHOS  --  41 45  BILITOT 0.3 0.3  --   PROT 6.0* 6.0*  --   ALBUMIN  --  3.4*  --    Recent Labs    01/24/18 1042 07/17/18 2157 07/18/18 0426 07/19/18 0342 07/26/18 10/13/18 2040  WBC 14.4* 14.4* 12.1* 10.8* 13.1 11.4*  NEUTROABS 10,253* 12.7*  --   --   --  8.3*  HGB 11.2* 10.6* 10.2* 10.6* 10.6* 11.3*  HCT 34.3* 33.9* 32.7* 33.6* 32* 35.8*  MCV 91.0 99.1 98.2 98.5  --  97.3  PLT 303 230 224 224 265 253   Lab Results  Component Value Date   TSH 0.775 07/18/2018   Lab Results  Component Value Date   HGBA1C 5.6 01/24/2018   Lab Results  Component Value Date   CHOL 159 06/07/2018   HDL 76 06/07/2018   LDLCALC 65 06/07/2018   TRIG 96 06/07/2018   CHOLHDL 2.1 06/07/2018    Significant Diagnostic Results in last 30 days:  Dg Chest 2 View  Result Date: 10/13/2018 CLINICAL DATA:  Acute onset of shortness of breath and left arm pain. EXAM: CHEST - 2 VIEW COMPARISON:  Chest radiograph performed 07/17/2018 FINDINGS: The lungs are well-aerated. Mild vascular congestion is noted. There is no evidence of focal opacification, pleural effusion or pneumothorax. The heart is mildly enlarged. The patient is status post median sternotomy, with evidence of prior CABG. No acute osseous abnormalities are seen. IMPRESSION: Mild vascular congestion and mild cardiomegaly. Lungs remain grossly clear. Electronically Signed   By: Garald Balding M.D.   On: 10/13/2018 22:22   Nm Myocar Multi W/spect W/wall Motion / Ef  Result Date: 10/15/2018  Defect 1: There is a medium defect of severe severity present in the basal anteroseptal, basal inferoseptal, mid anteroseptal, mid inferoseptal and apical septal location.  Findings consistent with prior myocardial infarction.  This is an intermediate risk study.  There was no ST  segment deviation noted during stress.  T wave inversion was noted at rest in the I and aVL leads. T wave inversion persisted and were unchanged.  There is absence of perfusion in the ventricular septum from apex to base, with no reversibility. Suggests previous infarction. No ischemia noted. EF visually appears to be 50%. Akinesis due to absent tracer uptake in ventricular septum, remainder of wall motion is grossly normal.    Assessment/Plan: Insomnia Desires to resume home med Melatonin 1mg  hs prn.   Polymyalgia rheumatica (HCC) Stable, continue Prednisone  CAD (coronary artery disease) F/u cardiology, continue Isosorbide, Ranexa, Plavix, Atorvastatin, f/u cardiology, observe.   Essential hypertension Blood pressure is controlled, continue Metoprolol  Unstable angina pectoris (HCC) Continue to f/u cardiology, continue Isorsobide, Ranexa, Plavix, Atorvastatin, prn NTG  Sleep apnea Continue CPAp  GERD (gastroesophageal reflux disease) Stable, continue Protonix.     Family/ staff Communication: plan of care reviewed with the patient and charge nurse.   Labs/tests ordered:  None  Time spend 25 minutes.

## 2018-10-17 NOTE — Assessment & Plan Note (Signed)
Continue to f/u cardiology, continue Isorsobide, Ranexa, Plavix, Atorvastatin, prn NTG

## 2018-10-17 NOTE — Assessment & Plan Note (Signed)
Blood pressure is controlled, continue Metoprolol. 

## 2018-10-20 LAB — BASIC METABOLIC PANEL
BUN: 36 — AB (ref 4–21)
Creatinine: 1.3 — AB (ref 0.5–1.1)
Glucose: 88
Potassium: 3.9 (ref 3.4–5.3)
Sodium: 139 (ref 137–147)

## 2018-10-20 LAB — LIPID PANEL
Cholesterol: 178 (ref 0–200)
HDL: 201 — AB (ref 35–70)
LDL Cholesterol: 99
LDl/HDL Ratio: 3.2
Triglycerides: 55 (ref 40–160)

## 2018-10-20 LAB — HEPATIC FUNCTION PANEL
ALT: 8 (ref 7–35)
AST: 10 — AB (ref 13–35)
Alkaline Phosphatase: 44 (ref 25–125)
Bilirubin, Total: 0.3

## 2018-10-20 LAB — CBC AND DIFFERENTIAL
HCT: 34 — AB (ref 36–46)
Hemoglobin: 11.1 — AB (ref 12.0–16.0)
Platelets: 277 (ref 150–399)
WBC: 11.7

## 2018-10-21 ENCOUNTER — Telehealth: Payer: Self-pay

## 2018-10-21 ENCOUNTER — Encounter: Payer: Self-pay | Admitting: Nurse Practitioner

## 2018-10-21 NOTE — Telephone Encounter (Signed)

## 2018-10-24 ENCOUNTER — Other Ambulatory Visit: Payer: Self-pay

## 2018-10-24 ENCOUNTER — Ambulatory Visit (INDEPENDENT_AMBULATORY_CARE_PROVIDER_SITE_OTHER): Payer: Medicare Other | Admitting: Cardiology

## 2018-10-24 ENCOUNTER — Encounter: Payer: Self-pay | Admitting: Internal Medicine

## 2018-10-24 ENCOUNTER — Encounter: Payer: Self-pay | Admitting: *Deleted

## 2018-10-24 ENCOUNTER — Encounter: Payer: Self-pay | Admitting: Cardiology

## 2018-10-24 ENCOUNTER — Non-Acute Institutional Stay (SKILLED_NURSING_FACILITY): Payer: Medicare Other | Admitting: Internal Medicine

## 2018-10-24 VITALS — BP 108/50 | HR 62 | Ht <= 58 in | Wt 194.8 lb

## 2018-10-24 DIAGNOSIS — I5032 Chronic diastolic (congestive) heart failure: Secondary | ICD-10-CM | POA: Diagnosis not present

## 2018-10-24 DIAGNOSIS — I771 Stricture of artery: Secondary | ICD-10-CM

## 2018-10-24 DIAGNOSIS — I25110 Atherosclerotic heart disease of native coronary artery with unstable angina pectoris: Secondary | ICD-10-CM

## 2018-10-24 DIAGNOSIS — F339 Major depressive disorder, recurrent, unspecified: Secondary | ICD-10-CM

## 2018-10-24 DIAGNOSIS — M79602 Pain in left arm: Secondary | ICD-10-CM | POA: Diagnosis not present

## 2018-10-24 DIAGNOSIS — N183 Chronic kidney disease, stage 3 unspecified: Secondary | ICD-10-CM

## 2018-10-24 DIAGNOSIS — M353 Polymyalgia rheumatica: Secondary | ICD-10-CM

## 2018-10-24 DIAGNOSIS — E785 Hyperlipidemia, unspecified: Secondary | ICD-10-CM

## 2018-10-24 LAB — CALCIUM
Albumin: 3.6
Calcium: 9.3
Carbon Dioxide, Total: 23
Chloride: 106
Globulin: 2.3
Total Protein: 5.9 g/dL

## 2018-10-24 NOTE — Progress Notes (Signed)
10/24/2018 Erin Ingram   10/11/1940  [id number]  Primary Physician Mast, Man X, NP Primary Cardiologist: Larae Grooms, MD  Electrophysiologist: None   Reason for Visit/CC: Post hospital follow-up for chest pain  HPI:  Erin Ingram is a 78 y.o. female with a hx of CAD s/p CABG 2000 and 2 DES to SVG-RCA 11/2014, CKD stage 3, polymyalgia rheumatica on steroids, hypothyroidism, chronic diastolic CHF, OSA on CPAP, HTN, hyperlipidemia, history of left subclavian artery stenosis and TIA. Her most recent cardiac catheterization in October 2019 showed 25% disease in the proximal to mid LAD, 90% ostial to distal circumflex lesion with patent saphenous vein graft to OM, 90% mid RCA stenosis with patent saphenous vein graft to the PDA and patent stents in the distal graft.  Saphenous vein graft to the diagonal was occluded.  The mid LAD was 90% stenosed the LIMA to LAD was patent.  No PCI was performed at that time continued medical therapy was recommended.   She was recently admitted to [facility] for chest pain and arm pain evaluation. She presented with a mix of typical and atypical features.  She ruled out for myocardial infarction with negative troponins.  She was seen in consultation by Dr. Ellyn Hack who recommended chemical nuclear stress test for evaluation.  Stress test done on 10/15/2018 showed no ischemia but suggested previous infarction.  EF was normal at 50%.  No further inpatient work-up was recommended.  Patient was discharged from the hospital now presents to clinic today for post hospital follow-up.  She denies any anterior chest pain but continues to have exertional left arm pain radiating from her proximal upper arm down to her index finger.  Pain feels like a squeezing sensation and improves with rest.  She has not required any use of sublingual nitroglycerin.  She reports full medication compliance with her antianginals.  She is on 120 mg of Imdur daily as well as 1000 mg  of Ranexa twice daily and 75 mg of metoprolol daily.  Her heart rate limits further titration of metoprolol.  Resting heart rate 62 bpm.  I have reviewed her chart and do not see where she has had any recent assessment of her reported left subclavian artery stenosis.  She reports that she was previously followed by Dr. Ilda Foil who referred her to a physician by the name of Dr. Deon Pilling but this was many years ago in the late 1990s.  She reports a diagnosis was made but no history of subclavian artery stenting.  We checked blood pressure in both arms and no significant variation.  Blood pressure in the right arm 112/52.  Blood pressure in the left arm 108/50.  She also has a strong radial pulse on both the left and right side.  Also of note, it appears she had a CT of the cervical spine done in January 2019 which did show advanced atherosclerotic calcifications involving the thoracic aorta and branch vessels including the carotid arteries and vertebral arteries.  She denies any lower extremity claudication.  Lipids have been decently controlled.  Recent lipid panel June 07, 2018 showed LDL at 65 mg/dL.  Triglycerides 96 mg/dL.  HDL 76 mg/dL.  Total cholesterol 159 mg/dL.   Cardiac Studies  Nuclear stress test 10/15/2018  Study Result    Defect 1: There is a medium defect of severe severity present in the basal anteroseptal, basal inferoseptal, mid anteroseptal, mid inferoseptal and apical septal location.  Findings consistent with prior myocardial infarction.  This  is an intermediate risk study.  There was no ST segment deviation noted during stress.  T wave inversion was noted at rest in the I and aVL leads. T wave inversion persisted and were unchanged.   There is absence of perfusion in the ventricular septum from apex to base, with no reversibility. Suggests previous infarction. No ischemia noted. EF visually appears to be 50%. Akinesis due to absent tracer uptake in ventricular septum,  remainder of wall motion is grossly normal.      Current Meds  Medication Sig  . alendronate (FOSAMAX) 70 MG tablet Take 70 mg by mouth every Sunday.   Marland Kitchen atorvastatin (LIPITOR) 10 MG tablet Take 10 mg by mouth daily.  . calcium carbonate (OS-CAL) 600 MG TABS tablet Take 600 mg by mouth 2 (two) times daily with a meal.  . Cholecalciferol (VITAMIN D) 50 MCG (2000 UT) tablet Take 2,000 Units by mouth daily.  . clopidogrel (PLAVIX) 75 MG tablet TAKE 1 TABLET ONCE DAILY.  Marland Kitchen Coenzyme Q10 10 MG capsule Take 1 capsule (10 mg total) by mouth at bedtime.  . fluticasone (FLONASE) 50 MCG/ACT nasal spray Place 2 sprays into both nostrils daily as needed for allergies.   . Glucosamine-Chondroit-Vit C-Mn (GLUCOSAMINE 1500 COMPLEX) CAPS Take 2 capsules by mouth daily after breakfast.  . isosorbide mononitrate (IMDUR) 60 MG 24 hr tablet Take 2 tablets (120 mg total) by mouth daily.  Marland Kitchen levocetirizine (XYZAL) 5 MG tablet TAKE 1 TABLET ONCE DAILY IN THE EVENING.  Marland Kitchen levothyroxine (SYNTHROID) 75 MCG tablet TAKE 1 TABLET ONCE DAILY ON EMPTY STOMACH.  . metoprolol succinate (TOPROL-XL) 50 MG 24 hr tablet Take 1.5 tablets orally once a day. Take with or immediately following a meal.  . MYRBETRIQ 50 MG TB24 tablet TAKE 1 TABLET BY MOUTH DAILY.  . nitroGLYCERIN (NITROSTAT) 0.4 MG SL tablet Place 0.4 mg under the tongue every 5 (five) minutes as needed for chest pain. X 3 doses  . nystatin (MYCOSTATIN/NYSTOP) powder Apply 100,000 g topically 2 (two) times daily.  . pantoprazole (PROTONIX) 40 MG tablet Take 1 tablet (40 mg total) by mouth daily.  . predniSONE (DELTASONE) 5 MG tablet Take 5 mg by mouth daily with breakfast. Once a day on Mon, Tues, Thurs, Fri, and Sat.  . ranolazine (RANEXA) 1000 MG SR tablet TAKE 1 TABLET BY MOUTH TWICE DAILY.  Marland Kitchen venlafaxine XR (EFFEXOR-XR) 75 MG 24 hr capsule TAKE 3 CAPSULES ONCE A DAY.   Allergies  Allergen Reactions  . Nsaids Other (See Comments)    Due to chronic kidney failure   . Butorphanol Other (See Comments)    agitation Constipation Produced a lot of urine, made patient feel crazy  . Sulfa Antibiotics Rash  . Bisoprolol Fumarate Other (See Comments)    Doesn't remember  . Butorphanol Tartrate Other (See Comments)    Produced a lot of urine, made patient feel crazy  . Codeine Nausea And Vomiting  . Demerol [Meperidine] Nausea And Vomiting  . Imipramine     Sweating, facial dysfunction   . Meperidine And Related Nausea And Vomiting  . Statins     MYALGIAS  . Tequin [Gatifloxacin] Other (See Comments)    Caused hypoglycemia Low blood sugar  . Brilinta [Ticagrelor] Rash    CAUSES PETECHIAE, PURPURA  . Pseudoephedrine Hcl Palpitations  . Septra [Sulfamethoxazole-Trimethoprim] Rash  . Sulfamethoxazole-Trimethoprim Rash   Past Medical History:  Diagnosis Date  . Anemia   . Arthritis    "fingers, back, shoulders, hips" (04/10/2016)  .  Chronic diastolic CHF (congestive heart failure) (HCC)    a. normal EF, LVEDP at [facility] in 6/17 33 >> Lasix started   . CKD (chronic kidney disease), stage III ([place])   . Coronary artery disease    a. s/p CABG 2000 (SVG/ free LIMA Y graft to the diagonal and distal LAD, SVG to the OM 1, and SVG to the PDA) . b. Cath 12/19/2014 90% dSVG to RCA s/p 2 overlapping DES. c. LHC 2016, 2017 no intervention except diuresis needed. d. Low risk nuc 03/2016. e. Cath 12/2017 patent LIMA-LAD, SVG-OM, SVG-PDA but occluded SVG to diag.   . Depression    with anxious component  . GERD (gastroesophageal reflux disease)   . History of hiatal hernia   . Hyperlipidemia   . Hypothyroidism   . Obesity   . OSA on CPAP   . PMR (polymyalgia rheumatica) (HCC)   . Pneumonia    "2-3 times" (04/10/2016)  . Stable angina (HCC)    microvascular, improved with Ranexa  . Subclavian artery stenosis (HCC)    a. L by cath note in 2016.  Marland Kitchen TIA (transient ischemic attack)    Family History  Problem Relation Age of Onset  . Heart disease Mother   . Heart  attack Mother   . Heart disease Father   . Heart attack Father   . Diabetes Brother   . Pulmonary embolism Brother   . Stroke Paternal Grandmother   . Hypertension Neg Hx    Past Surgical History:  Procedure Laterality Date  . BREAST BIOPSY Right 1964  . CARDIAC CATHETERIZATION  08   patent grafts, no culprit lesions, EF 65%  . CARDIAC CATHETERIZATION N/A 12/19/2014   Procedure: Left Heart Cath and Cors/Grafts Angiography;  Surgeon: Jettie Booze, MD;  Location: [place] [facility];  Service: Cardiovascular;  Laterality: N/A;  . CARDIAC CATHETERIZATION N/A 12/19/2014   Procedure: Coronary Stent Intervention;  Surgeon: Jettie Booze, MD;  Location: [place] [facility];  Service: Cardiovascular;  Laterality: N/A;  . CARDIAC CATHETERIZATION N/A 01/01/2015   Procedure: Left Heart Cath and Cors/Grafts Angiography;  Surgeon: Jettie Booze, MD;  Location: [place] [facility];  Service: Cardiovascular;  Laterality: N/A;  . CARDIAC CATHETERIZATION N/A 09/20/2015   Procedure: Left Heart Cath and Cors/Grafts Angiography;  Surgeon: Jettie Booze, MD;  Location: [place] [facility];  Service: Cardiovascular;  Laterality: N/A;  . CATARACT EXTRACTION W/ INTRAOCULAR LENS  IMPLANT, BILATERAL Bilateral 2011  . COLONOSCOPY WITH PROPOFOL N/A 07/27/2016   Procedure: COLONOSCOPY WITH PROPOFOL;  Surgeon: Wilford Corner, MD;  Location: [place];  Service: Endoscopy;  Laterality: N/A;  . CORONARY ARTERY BYPASS GRAFT  2000   ASCVD, multivessel, S./P.  . DILATION AND CURETTAGE OF UTERUS  1980s  . ESOPHAGOGASTRODUODENOSCOPY (EGD) WITH PROPOFOL N/A 07/27/2016   Procedure: ESOPHAGOGASTRODUODENOSCOPY (EGD) WITH PROPOFOL;  Surgeon: Wilford Corner, MD;  Location: [place];  Service: Endoscopy;  Laterality: N/A;  . FRACTURE SURGERY    . LEFT HEART CATH AND CORS/GRAFTS ANGIOGRAPHY N/A 01/12/2018   Procedure: LEFT HEART CATH AND CORS/GRAFTS ANGIOGRAPHY;  Surgeon: Jettie Booze, MD;   Location: [place] [facility];  Service: Cardiovascular;  Laterality: N/A;  . PATELLA FRACTURE SURGERY Left 1993   Social History   Socioeconomic History  . Marital status: Married    Spouse name: Not on file  . Number of children: 2  . Years of education: College  . Highest education level: Not on file  Occupational  History  . Occupation: Retired Cytogeneticist  Social Needs  . Financial resource strain: Not hard at all  . Food insecurity    Worry: Never true    Inability: Never true  . Transportation needs    Medical: No    Non-medical: No  Tobacco Use  . Smoking status: Former Smoker    Packs/day: 0.50    Years: 7.00    Pack years: 3.50    Types: Cigarettes    Quit date: 08/21/1969    Years since quitting: 49.2  . Smokeless tobacco: Never Used  Substance and Sexual Activity  . Alcohol use: No    Alcohol/week: 0.0 standard drinks    Frequency: Never    Comment: rare  . Drug use: No  . Sexual activity: Never  Lifestyle  . Physical activity    Days per week: 0 days    Minutes per session: 0 min  . Stress: To some extent  Relationships  . Social connections    Talks on phone: More than three times a week    Gets together: More than three times a week    Attends religious service: Never    Active member of club or organization: No    Attends meetings of clubs or organizations: Never    Relationship status: Married  . Intimate partner violence    Fear of current or ex partner: No    Emotionally abused: No    Physically abused: No    Forced sexual activity: No  Other Topics Concern  . Not on file  Social History Narrative   Social History     Social History Narrative       Lives in [place] with husband.   Diet: Regular   Do you drink/eat things with caffeine? 1 cup caffeine per day.  Not much coffee   Marital status: Married                           What year were you married? 1964   Do you live in a house, apartment, assisted living, condo, trailer,  etc)? Here at [facility]   Is it one or more stories?   How many persons live in your home? 2   Do you have any pets in your home? No   Current or past profession: Engineer, production (RN)   Do you exercise? I did                                                Type & how often: Swimming, Tai Chi, exercise classes   Do you have a living will? yes   Do you have a DNR Form? Yes   Do you have a POA/HPOA forms?               Lipid Panel     Component Value Date/Time   CHOL 178 10/20/2018   TRIG 55 10/20/2018   HDL 201 (A) 10/20/2018   CHOLHDL 2.1 06/07/2018 0710   VLDL 40 01/12/2018 0549   LDLCALC 99 10/20/2018   LDLCALC 65 06/07/2018 0710    Review of Systems: General: negative for chills, fever, night sweats or weight changes.  Cardiovascular: negative for chest pain, dyspnea on exertion, edema, orthopnea, palpitations, paroxysmal nocturnal dyspnea or shortness of breath Dermatological: negative for  rash Respiratory: negative for cough or wheezing Urologic: negative for hematuria Abdominal: negative for nausea, vomiting, diarrhea, bright red blood per rectum, melena, or hematemesis Neurologic: negative for visual changes, syncope, or dizziness All other systems reviewed and are otherwise negative except as noted above.   Physical Exam:  Blood pressure (!) 108/50, pulse 62, height '4\' 10"'$  (1.473 m), weight 194 lb 12.8 oz (88.4 kg), SpO2 98 %.  General appearance: alert, cooperative, no distress and moderately obese Neck: no carotid bruit and no JVD Lungs: clear to auscultation bilaterally Heart: regular rate and rhythm, S1, S2 normal, no murmur, click, rub or gallop Extremities: extremities normal, atraumatic, no cyanosis or edema Pulses: 2+ and symmetric Skin: Skin color, texture, turgor normal. No rashes or lesions Neurologic: Grossly normal  EKG not performed today -- personally reviewed   ASSESSMENT AND PLAN:   1. CAD: s/p CABG 2000 and 2 DES to [facility] 11/2014.  Her most  recent cardiac catheterization in October 2019 showed 25% disease in the proximal to mid LAD, 90% ostial to distal circumflex lesion with patent saphenous vein graft to OM, 90% mid RCA stenosis with patent saphenous vein graft to the PDA and patent stents in the distal graft.  Saphenous vein graft to the diagonal was occluded.  The mid LAD was 90% stenosed the LIMA to LAD was patent.  No PCI was performed at that time continued medical therapy was recommended.  Recent nuclear stress test 10/15/2018 showed no ischemia.  No further anterior CP but has chronic left arm pain (see below).  We will continue medical management.   She reports full medication compliance with her antianginals.  She is on 120 mg of Imdur daily as well as 1000 mg of Ranexa twice daily and 75 mg of metoprolol daily.  Her heart rate limits further titration of metoprolol.  Resting heart rate 62 bpm. She did not tolerate amlodipine previously due to peripheral edema.     2. H/o left subclavian artery stenosis: Apparently diagnosis has been made but I do not see any specific documentation outlining the details or severity.  Per patient report she was previously followed by Dr. Ilda Foil who referred her to a physician by the name of Dr. Deon Pilling but this was many years ago in the late 1990s.  She was made aware of the diagnosis but per patient report there is no history of required intervention.  I have reviewed patient's chart.  CT of the cervical spine in 03/2017 showed advanced atherosclerotic calcifications involving the thoracic aorta and branch vessels including the carotid arteries and vertebral arteries.  She has had frequent left arm pain with radiation from the proximal upper extremity down to her index finger described as a squeezing sensation that occurs with exertion and resolves with rest.  No anterior chest pain. She does have a h/o TIA.  She was recently evaluated at [facility] for her left arm pain and had overnight admission  where she ruled out for myocardial infarction with negative troponins and had a nuclear stress test that showed no ischemia.   While her symptoms are concerning for symptomatic subclavian artery stenosis, her physical exam today is fairly benign.  I did not appreciate any bruits in the clavicular area and she has fairly equal pressures in both her right and left arm.  Right arm 112/52.  Left arm 108/50 and she has a strong palpable 2+ radial pulse.  We have no other explanation for her symptoms.  I will order upper  extremity arterial Dopplers for further assessment.  If negative no further work-up.    Follow-Up with Dr. Irish Lack in 3 months.    Ladoris Gene, MHS [facility] 10/24/2018 1:32 PM

## 2018-10-24 NOTE — Progress Notes (Signed)
Provider:  Veleta Miners  MD Location:  [facility] Room Number: [id number] Place of Service:  SNF (31)  PCP: Mast, Man X, NP Patient Care Team: Mast, Man X, NP as PCP - General (Internal Medicine) Jettie Booze, MD as PCP - Cardiology (Cardiology) Wilford Corner, MD as Consulting Physician (Gastroenterology) Marlou Sa, Tonna Corner, MD as Consulting Physician (Orthopedic Surgery) Marcial Pacas, MD as Consulting Physician (Neurology) Franchot Gallo, MD as Consulting Physician (Urology) Kary Kos, MD as Consulting Physician (Neurosurgery)  Extended Emergency Contact Information Primary Emergency Contact: Merlinda Frederick Address: [address]          [place] [address] [place] Phone: [phone] Mobile Phone: [phone] Relation: Spouse Secondary Emergency Contact: Elizabeth Mobile Phone: [phone] Relation: Son  Code Status: DNR Goals of Care: Advanced Directive information Advanced Directives 10/17/2018  Does Patient Have a Medical Advance Directive? Yes  Type of Advance Directive Out of facility DNR (pink MOST or yellow form)  Does patient want to make changes to medical advance directive? No - Patient declined  Copy of [place] in Chart? -  Would patient like information on creating a medical advance directive? No - Patient declined  Pre-existing out of facility DNR order (yellow form or pink MOST form) Yellow form placed in chart (order not valid for inpatient use);Pink MOST form placed in chart (order not valid for inpatient use)      Chief Complaint  Patient presents with   New Admit To SNF    HPI: Patient is a 78 y.o. female seen today for admission to SNF for Quarantine and eventual admission to Assisted Living  Patient has h/o Obesity with OSA, Chronic Diastolic CHF, CAD s/p CABG 2000 and stent placement,PMR on chronic Steroids and Hypothyroidism, chronic diastolic CHF and  hyperlipidemia Patient was in the hospital from 7/23 to 7/25 for precordial chest pain. Patient lives with her husband in [place] and recently had decided to move to assisted living.  During the move patient suddenly had left-sided chest pain radiating down her left arm pain.  She says this is her typical cardiac pain.  She was sent to the emergency room she was admitted. Her nuclear stress test was negative for any acute ischemia. But showed previous infarction with EF of 50% . Patient is back in the facility now.  She states he can climb pain for 2 weeks before she moves back to her room She continues to have some pain off and on.  She states she this is her baseline.  Her pain is exertional. She denies any shortness of breath or any other associated features She was evaluated by Education officer, museum and is depressed.     Past Medical History:  Diagnosis Date   Anemia    Arthritis    "fingers, back, shoulders, hips" (04/10/2016)   Chronic diastolic CHF (congestive heart failure) (HCC)    a. normal EF, LVEDP [facility] in 6/17 33 >> Lasix started    CKD (chronic kidney disease), stage III (HCC)    Coronary artery disease    a. s/p CABG 2000 (SVG/ free LIMA Y graft to the diagonal and distal LAD, SVG to the OM 1, and SVG to the PDA) . b. Cath 12/19/2014 90% dSVG to RCA s/p 2 overlapping DES. c. LHC 2016, 2017 no intervention except diuresis needed. d. Low risk nuc 03/2016. e. Cath 12/2017 patent LIMA-LAD, SVG-OM, SVG-PDA but occluded SVG to diag.    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 L by cath note in 2016.   TIA (transient ischemic attack)    Past Surgical History:    Procedure Laterality Date   BREAST BIOPSY Right 1964   CARDIAC CATHETERIZATION  08   patent grafts, no culprit lesions, EF 65%   CARDIAC CATHETERIZATION N/A 12/19/2014   Procedure: Left Heart Cath and Cors/Grafts Angiography;  Surgeon: Jettie Booze, MD;  Location: [facility];  Service: Cardiovascular;  Laterality: N/A;   CARDIAC CATHETERIZATION N/A 12/19/2014   Procedure: Coronary Stent Intervention;  Surgeon: Jettie Booze, MD;  Location: [facility];  Service: Cardiovascular;  Laterality: N/A;   CARDIAC CATHETERIZATION N/A 01/01/2015   Procedure: Left Heart Cath and Cors/Grafts Angiography;  Surgeon: Jettie Booze, MD;  Location: [facility];  Service: Cardiovascular;  Laterality: N/A;   CARDIAC CATHETERIZATION N/A 09/20/2015   Procedure: Left Heart Cath and Cors/Grafts Angiography;  Surgeon: Jettie Booze, MD;  Location: [facility];  Service: Cardiovascular;  Laterality: N/A;   CATARACT EXTRACTION W/ INTRAOCULAR LENS  IMPLANT, BILATERAL Bilateral 2011   COLONOSCOPY WITH PROPOFOL N/A 07/27/2016   Procedure: COLONOSCOPY WITH PROPOFOL;  Surgeon: Wilford Corner, MD;  Location: [facility];  Service: Endoscopy;  Laterality: N/A;   CORONARY ARTERY BYPASS GRAFT  2000   ASCVD, multivessel, S./P.   DILATION AND CURETTAGE OF UTERUS  1980s   ESOPHAGOGASTRODUODENOSCOPY (EGD) WITH PROPOFOL N/A 07/27/2016   Procedure: ESOPHAGOGASTRODUODENOSCOPY (EGD) WITH PROPOFOL;  Surgeon: Wilford Corner, MD;  Location: [facility];  Service: Endoscopy;  Laterality: N/A;   FRACTURE SURGERY     LEFT HEART CATH AND CORS/GRAFTS ANGIOGRAPHY N/A 01/12/2018   Procedure: LEFT HEART CATH AND CORS/GRAFTS ANGIOGRAPHY;  Surgeon: Jettie Booze, MD;  Location: [facility];  Service: Cardiovascular;  Laterality: N/A;   PATELLA FRACTURE SURGERY Left 1993    reports that she quit smoking about 49 years ago. Her smoking use included cigarettes. She  has a 3.50 pack-year smoking history. She has never used smokeless tobacco. She reports that she does not drink alcohol or use drugs. Social History   Socioeconomic History   Marital status: Married    Spouse name: Not on file   Number of children: 2   Years of education: College   Highest education level: Not on file  Occupational History   Occupation: Retired Cytogeneticist  Social Needs   Emergency planning/management officer strain: Not hard at all   Food insecurity    Worry: Never true    Inability: Never true   Transportation needs    Medical: No    Non-medical: No  Tobacco Use   Smoking status: Former Smoker    Packs/day: 0.50    Years: 7.00    Pack years: 3.50    Types: Cigarettes    Quit date: 08/21/1969    Years since quitting: 49.2   Smokeless tobacco: Never Used  Substance and Sexual Activity   Alcohol use: No    Alcohol/week: 0.0 standard drinks    Frequency: Never    Comment: rare  Drug use: No   Sexual activity: Never  Lifestyle   Physical activity    Days per week: 0 days    Minutes per session: 0 min   Stress: To some extent  Relationships   Social connections    Talks on phone: More than three times a week    Gets together: More than three times a week    Attends religious service: Never    Active member of club or organization: No    Attends meetings of clubs or organizations: Never    Relationship status: Married   Intimate partner violence    Fear of current or ex partner: No    Emotionally abused: No    Physically abused: No    Forced sexual activity: No  Other Topics Concern   Not on file  Social History Narrative   Social History     Social History Narrative       Lives in [place] with husband.   Diet: Regular   Do you drink/eat things with caffeine? 1 cup caffeine per day.  Not much coffee   Marital status: Married                           What year were you married? 1964   Do you live in a house, apartment, assisted  living, condo, trailer, etc)? Here at [facility]   Is it one or more stories?   How many persons live in your home? 2   Do you have any pets in your home? No   Current or past profession: Engineer, production (RN)   Do you exercise? I did                                                Type & how often: Swimming, Tai Chi, exercise classes   Do you have a living will? yes   Do you have a DNR Form? Yes   Do you have a POA/HPOA forms?              Functional Status Survey:    Family History  Problem Relation Age of Onset   Heart disease Mother    Heart attack Mother    Heart disease Father    Heart attack Father    Diabetes Brother    Pulmonary embolism Brother    Stroke Paternal Grandmother    Hypertension Neg Hx     Health Maintenance  Topic Date Due   INFLUENZA VACCINE  10/22/2018   TETANUS/TDAP  04/20/2022   DEXA SCAN  Completed   PNA vac Low Risk Adult  Completed    Allergies  Allergen Reactions   Nsaids Other (See Comments)    Due to chronic kidney failure   Butorphanol Other (See Comments)    agitation Constipation Produced a lot of urine, made patient feel crazy   Sulfa Antibiotics Rash   Bisoprolol Fumarate Other (See Comments)    Doesn't remember   Butorphanol Tartrate Other (See Comments)    Produced a lot of urine, made patient feel crazy   Codeine Nausea And Vomiting   Demerol [Meperidine] Nausea And Vomiting   Imipramine     Sweating, facial dysfunction    Meperidine And Related Nausea And Vomiting   Statins     MYALGIAS  Tequin [Gatifloxacin] Other (See Comments)    Caused hypoglycemia Low blood sugar   Brilinta [Ticagrelor] Rash    CAUSES PETECHIAE, PURPURA   Pseudoephedrine Hcl Palpitations   Septra [Sulfamethoxazole-Trimethoprim] Rash   Sulfamethoxazole-Trimethoprim Rash    Outpatient Encounter Medications as of 10/24/2018  Medication Sig   alendronate (FOSAMAX) 70 MG tablet Take 70 mg by mouth every Sunday.     atorvastatin (LIPITOR) 10 MG tablet Take 10 mg by mouth daily.   calcium carbonate (OS-CAL) 600 MG TABS tablet Take 600 mg by mouth 2 (two) times daily with a meal.   Cholecalciferol (VITAMIN D) 50 MCG (2000 UT) tablet Take 2,000 Units by mouth daily.   clopidogrel (PLAVIX) 75 MG tablet TAKE 1 TABLET ONCE DAILY. (Patient taking differently: Take 75 mg by mouth daily. )   Coenzyme Q10 10 MG capsule Take 1 capsule (10 mg total) by mouth at bedtime.   fluticasone (FLONASE) 50 MCG/ACT nasal spray Place 2 sprays into both nostrils daily as needed for allergies.    Glucosamine-Chondroit-Vit C-Mn (GLUCOSAMINE 1500 COMPLEX) CAPS Take 2 capsules by mouth daily after breakfast.   isosorbide mononitrate (IMDUR) 60 MG 24 hr tablet Take 2 tablets (120 mg total) by mouth daily.   levocetirizine (XYZAL) 5 MG tablet TAKE 1 TABLET ONCE DAILY IN THE EVENING. (Patient taking differently: Take 5 mg by mouth every evening. )   levothyroxine (SYNTHROID) 75 MCG tablet TAKE 1 TABLET ONCE DAILY ON EMPTY STOMACH. (Patient taking differently: Take 75 mcg by mouth daily before breakfast. )   metoprolol succinate (TOPROL-XL) 50 MG 24 hr tablet Take 1.5 tablets orally once a day. Take with or immediately following a meal. (Patient taking differently: Take 75 mg by mouth daily. )   MYRBETRIQ 50 MG TB24 tablet TAKE 1 TABLET BY MOUTH DAILY. (Patient taking differently: Take 50 mg by mouth daily. )   nitroGLYCERIN (NITROSTAT) 0.4 MG SL tablet Place 0.4 mg under the tongue every 5 (five) minutes as needed for chest pain. X 3 doses   nystatin (MYCOSTATIN/NYSTOP) powder Apply 100,000 g topically 2 (two) times daily.   pantoprazole (PROTONIX) 40 MG tablet Take 1 tablet (40 mg total) by mouth daily.   predniSONE (DELTASONE) 5 MG tablet Take 5 mg by mouth daily with breakfast. Once a day on Mon, Tues, Thurs, Fri, and Sat.   ranolazine (RANEXA) 1000 MG SR tablet TAKE 1 TABLET BY MOUTH TWICE DAILY. (Patient taking  differently: Take 1,000 mg by mouth 2 (two) times daily. )   venlafaxine XR (EFFEXOR-XR) 75 MG 24 hr capsule TAKE 3 CAPSULES ONCE A DAY.   zinc oxide 20 % ointment Apply 1 application topically as needed for irritation.   [DISCONTINUED] predniSONE (DELTASONE) 5 MG tablet Take 5-7.5 mg by mouth See admin instructions. Take 1 tablet on Sunday and Wednesday then Take 1 and 1/2 tablets  all other days   No facility-administered encounter medications on file as of 10/24/2018.     Review of Systems  Review of Systems  Constitutional: Negative for activity change, appetite change, chills, diaphoresis, fatigue and fever.  HENT: Negative for mouth sores, postnasal drip, rhinorrhea, sinus pain and sore throat.   Respiratory: Negative for apnea, cough, shortness of breath and wheezing.   Cardiovascular: Negative for  palpitations and leg swelling.  Gastrointestinal: Negative for abdominal distention, abdominal pain, constipation, diarrhea, nausea and vomiting.  Genitourinary: Negative for dysuria and frequency.  Musculoskeletal: Negative for arthralgias, joint swelling and myalgias.  Skin: Negative for rash.  Neurological:  Negative for dizziness, syncope, weakness, light-headedness and numbness.  Psychiatric/Behavioral: Negative for behavioral problems, confusion and sleep disturbance.     Vitals:   10/24/18 1015  BP: (!) 110/52  Pulse: 67  Resp: 19  Temp: 97.8 F (36.6 C)  SpO2: 96%  Weight: 197 lb 6.4 oz (89.5 kg)  Height: _0  (1.473 m)   Body mass index is 41.26 kg/m. Physical Exam Vitals signs reviewed.  Constitutional:      Appearance: She is obese.  HENT:     Head: Normocephalic.     Nose: Nose normal.     Mouth/Throat:     Mouth: Mucous membranes are moist.     Pharynx: Oropharynx is clear.  Eyes:     Pupils: Pupils are equal, round, and reactive to light.  Neck:     Musculoskeletal: Neck supple.  Cardiovascular:     Rate and Rhythm: Normal rate and regular rhythm.    Pulmonary:     Effort: Pulmonary effort is normal. No respiratory distress.     Breath sounds: Normal breath sounds.  Abdominal:     General: Abdomen is flat. Bowel sounds are normal.     Palpations: Abdomen is soft.  Musculoskeletal:     Comments: Mild swelling Bilateral  Skin:    General: Skin is warm and dry.  Neurological:     General: No focal deficit present.     Mental Status: She is alert and oriented to person, place, and time.  Psychiatric:        Mood and Affect: Mood normal.        Thought Content: Thought content normal.        Judgment: Judgment normal.     Labs reviewed: Basic Metabolic Panel: Recent Labs    10/13/18 2040 10/14/18 0229 10/15/18 0226 10/20/18  NA 138 140 139 139  K 4.1 4.2 3.8 3.9  CL 106 110 107 106  CO2 21* 19* 22 23  GLUCOSE 98 84 85  --   BUN 31* 28* 22 36*  CREATININE 1.62* 1.40* 1.34* 1.3*  CALCIUM 8.7* 8.6* 8.9 9.3   Liver Function Tests: Recent Labs    06/07/18 0710 07/17/18 2157 07/26/18 10/20/18  AST 10 14* 8* 10*  ALT _1 ALKPHOS  --  41 45 44  BILITOT 0.3 0.3  --   --   PROT 6.0* 6.0*  --  5.9  ALBUMIN  --  3.4*  --  3.6   No results for input(s): LIPASE, AMYLASE in the last 8760 hours. No results for input(s): AMMONIA in the last 8760 hours. CBC: Recent Labs    01/24/18 1042 07/17/18 2157 07/18/18 0426 07/19/18 0342 07/26/18 10/13/18 2040 10/20/18  WBC 14.4* 14.4* 12.1* 10.8* 13.1 11.4* 11.7  NEUTROABS 10,253* 12.7*  --   --   --  8.3*  --   HGB 11.2* 10.6* 10.2* 10.6* 10.6* 11.3* 11.1*  HCT 34.3* 33.9* 32.7* 33.6* 32* 35.8* 34*  MCV 91.0 99.1 98.2 98.5  --  97.3  --   PLT 303 230 224 224 265 253 277   Cardiac Enzymes: Recent Labs    01/12/18 0007 01/12/18 0549 07/17/18 2157  TROPONINI <0.03 <0.03 <0.03   BNP: Invalid input(s): POCBNP Lab Results  Component Value Date   HGBA1C 5.6 01/24/2018   Lab Results  Component Value Date   TSH 0.775 07/18/2018   Lab Results  Component Value  Date   VITAMINB12 288 06/24/2017   No results  found for: FOLATE No results found for: IRON, TIBC, FERRITIN  Imaging and Procedures obtained prior to SNF admission: Dg Chest 2 View  Result Date: 10/13/2018 CLINICAL DATA:  Acute onset of shortness of breath and left arm pain. EXAM: CHEST - 2 VIEW COMPARISON:  Chest radiograph performed 07/17/2018 FINDINGS: The lungs are well-aerated. Mild vascular congestion is noted. There is no evidence of focal opacification, pleural effusion or pneumothorax. The heart is mildly enlarged. The patient is status post median sternotomy, with evidence of prior CABG. No acute osseous abnormalities are seen. IMPRESSION: Mild vascular congestion and mild cardiomegaly. Lungs remain grossly clear. Electronically Signed   By: Garald Balding M.D.   On: 10/13/2018 22:22    Assessment/Plan Coronary artery disease Stress Test Negative On Imdur, Ranexa, Plavix and Beta Blocker She has a follow-up appointment with cardiology today Chronic heart failure with preserved ejection fraction ([place]) - Plan:  She is off her Lasix for now We will continue to watch her  CKD (chronic kidney disease) stage 3, GFR 30-59 ml/min (HCC) - Plan:  Creatinine stable  Polymyalgia rheumatica ([place]) - Plan:  Continue on prednisone Obstructive sleep apnea Tolerating her CPAP Osteoporosis On Fosamax  Hyperlipidemia LDL goal <70 - Plan:  Continue on statin Her LDL was 99 Possible increase her Lipitor Depression, recurrent ([place]) - Plan:  On Effexor We discussed about adding another medication but at this time patient said her symptoms are controlled and she is not interested.     Family/ staff Communication:   Labs/tests ordered: Total time spent in this patient care encounter was  _45  minutes; greater than 50% of the visit spent counseling patient and staff, reviewing records , Labs and coordinating care for problems addressed at this encounter.

## 2018-10-24 NOTE — Patient Instructions (Signed)
Medication Instructions:  none If you need a refill on your cardiac medications before your next appointment, please call your pharmacy.   Lab work: none If you have labs (blood work) drawn today and your tests are completely normal, you will receive your results only by: Marland Kitchen MyChart Message (if you have MyChart) OR . A paper copy in the mail If you have any lab test that is abnormal or we need to change your treatment, we will call you to review the results.  Testing/Procedures: Vascular US Upper Extremity Arterial Duplex-Bilateral at [facility]  Follow-Up: At [facility], you and your health needs are our priority.  As part of our continuing mission to provide you with exceptional heart care, we have created designated Provider Care Teams.  These Care Teams include your primary Cardiologist (physician) and Advanced Practice Providers (APPs -  Physician Assistants and Nurse Practitioners) who all work together to provide you with the care you need, when you need it. .   Any Other Special Instructions Will Be Listed Below (If Applicable).

## 2018-10-25 ENCOUNTER — Other Ambulatory Visit: Payer: Self-pay | Admitting: Cardiology

## 2018-10-25 DIAGNOSIS — M79602 Pain in left arm: Secondary | ICD-10-CM

## 2018-10-25 DIAGNOSIS — I771 Stricture of artery: Secondary | ICD-10-CM

## 2018-10-26 ENCOUNTER — Other Ambulatory Visit (HOSPITAL_COMMUNITY): Payer: Medicare Other

## 2018-10-28 ENCOUNTER — Other Ambulatory Visit: Payer: Self-pay

## 2018-10-28 ENCOUNTER — Ambulatory Visit (HOSPITAL_COMMUNITY)
Admission: RE | Admit: 2018-10-28 | Discharge: 2018-10-28 | Disposition: A | Discharge status: Home / Self Care | Payer: Medicare Other | Source: Ambulatory Visit | Attending: Cardiovascular Disease | Admitting: Cardiovascular Disease

## 2018-10-28 DIAGNOSIS — M79602 Pain in left arm: Secondary | ICD-10-CM | POA: Insufficient documentation

## 2018-10-28 DIAGNOSIS — I771 Stricture of artery: Secondary | ICD-10-CM

## 2018-11-02 ENCOUNTER — Telehealth: Payer: Self-pay

## 2018-11-02 NOTE — Telephone Encounter (Signed)
-----   Message from Consuelo Pandy, [place] sent at 11/02/2018 12:31 PM EDT ----- Ultrasound is normal. No significant blockages seen  \

## 2018-11-02 NOTE — Telephone Encounter (Signed)
Notes recorded by Frederik Schmidt, RN on 11/02/2018 at 12:56 PM EDT  The patient has been notified of the result and verbalized understanding. All questions (if any) were answered.  Frederik Schmidt, RN 11/02/2018 12:56 PM

## 2018-12-12 ENCOUNTER — Other Ambulatory Visit: Payer: Self-pay | Admitting: Nurse Practitioner

## 2018-12-12 ENCOUNTER — Other Ambulatory Visit: Payer: Self-pay | Admitting: Interventional Cardiology

## 2018-12-12 ENCOUNTER — Other Ambulatory Visit: Payer: Self-pay | Admitting: Physician Assistant

## 2019-01-23 ENCOUNTER — Non-Acute Institutional Stay: Payer: Medicare Other | Admitting: Nurse Practitioner

## 2019-01-23 DIAGNOSIS — S51011A Laceration without foreign body of right elbow, initial encounter: Secondary | ICD-10-CM

## 2019-01-23 DIAGNOSIS — M353 Polymyalgia rheumatica: Secondary | ICD-10-CM | POA: Diagnosis not present

## 2019-01-23 DIAGNOSIS — I2510 Atherosclerotic heart disease of native coronary artery without angina pectoris: Secondary | ICD-10-CM

## 2019-01-23 DIAGNOSIS — M8000XA Age-related osteoporosis with current pathological fracture, unspecified site, initial encounter for fracture: Secondary | ICD-10-CM

## 2019-01-23 DIAGNOSIS — I10 Essential (primary) hypertension: Secondary | ICD-10-CM

## 2019-01-23 DIAGNOSIS — S51019A Laceration without foreign body of unspecified elbow, initial encounter: Secondary | ICD-10-CM | POA: Insufficient documentation

## 2019-01-23 NOTE — Assessment & Plan Note (Signed)
Stable, continue Prednisone 5mg  qd 5x/wk.

## 2019-01-23 NOTE — Progress Notes (Addendum)
Location:   [facility] Room Number: [id number] Place of Service:  ALF (13) Provider: Lennie Odor Qais Jowers NP  Evangelyn Crouse X, NP  Patient Care Team: Manhattan Mccuen X, NP as PCP - General (Internal Medicine) Jettie Booze, MD as PCP - Cardiology (Cardiology) Wilford Corner, MD as Consulting Physician (Gastroenterology) Marlou Sa, Tonna Corner, MD as Consulting Physician (Orthopedic Surgery) Marcial Pacas, MD as Consulting Physician (Neurology) Franchot Gallo, MD as Consulting Physician (Urology) Kary Kos, MD as Consulting Physician (Neurosurgery)  Extended Emergency Contact Information Primary Emergency Contact: Merlinda Frederick Address: [address]          [place] [address] [place] Phone: [phone] Mobile Phone: [phone] Relation: Spouse Secondary Emergency Contact: [place] Mobile Phone: [phone] Relation: Son  Code Status: DNR Goals of care: Advanced Directive information Advanced Directives 10/17/2018  Does Patient Have a Medical Advance Directive? Yes  Type of Advance Directive Out of facility DNR (pink MOST or yellow form)  Does patient want to make changes to medical advance directive? No - Patient declined  Copy of [place] in Chart? -  Would patient like information on creating a medical advance directive? No - Patient declined  Pre-existing out of facility DNR order (yellow form or pink MOST form) Yellow form placed in chart (order not valid for inpatient use);Pink MOST form placed in chart (order not valid for inpatient use)     Chief Complaint  Patient presents with  . Acute Visit    skin tear    HPI:  Pt is a 78 y.o. female seen today for an acute visit for skin tear right forearm 01/21/19, sustained from reaching for cell phone and hitting on the foot of bed. Hx of CAD, on Ranexa 1000mg  bid, Isosorbide 120mg  qd, Plavix 75mg  qd. Polymyalgia rheumatica, stable on Prednisone 5mg  qd 5x/wk. HTN,  blood pressure is controlled on Metoprolol 75mg  qd.    Past Medical History:  Diagnosis Date  . Anemia   . Arthritis    "fingers, back, shoulders, hips" (04/10/2016)  . Chronic diastolic CHF (congestive heart failure) (HCC)    a. normal EF, LVEDP at [facility] in 6/17 33 >> Lasix started   . CKD (chronic kidney disease), stage III   . Coronary artery disease    a. s/p CABG 2000 (SVG/ free LIMA Y graft to the diagonal and distal LAD, SVG to the OM 1, and SVG to the PDA) . b. Cath 12/19/2014 90% dSVG to RCA s/p 2 overlapping DES. c. LHC 2016, 2017 no intervention except diuresis needed. d. Low risk nuc 03/2016. e. Cath 12/2017 patent LIMA-LAD, SVG-OM, SVG-PDA but occluded SVG to diag.   . Depression    with anxious component  . GERD (gastroesophageal reflux disease)   . History of hiatal hernia   . Hyperlipidemia   . Hypothyroidism   . Obesity   . OSA on CPAP   . PMR (polymyalgia rheumatica) (HCC)   . Pneumonia    "2-3 times" (04/10/2016)  . Stable angina (HCC)    microvascular, improved with Ranexa  . Subclavian artery stenosis (HCC)    a. L by cath note in 2016.  Marland Kitchen TIA (transient ischemic attack)    Past Surgical History:  Procedure Laterality Date  . BREAST BIOPSY Right 1964  . CARDIAC CATHETERIZATION  08   patent grafts, no culprit lesions, EF 65%  . CARDIAC CATHETERIZATION N/A 12/19/2014   Procedure: Left Heart Cath and Cors/Grafts Angiography;  Surgeon: Conception Oms  Hassell Done, MD;  Location: [place] [facility];  Service: Cardiovascular;  Laterality: N/A;  . CARDIAC CATHETERIZATION N/A 12/19/2014   Procedure: Coronary Stent Intervention;  Surgeon: Jettie Booze, MD;  Location: [place] [facility];  Service: Cardiovascular;  Laterality: N/A;  . CARDIAC CATHETERIZATION N/A 01/01/2015   Procedure: Left Heart Cath and Cors/Grafts Angiography;  Surgeon: Jettie Booze, MD;  Location: [place] [facility];  Service: Cardiovascular;  Laterality: N/A;  . CARDIAC CATHETERIZATION N/A  09/20/2015   Procedure: Left Heart Cath and Cors/Grafts Angiography;  Surgeon: Jettie Booze, MD;  Location: [place] [facility];  Service: Cardiovascular;  Laterality: N/A;  . CATARACT EXTRACTION W/ INTRAOCULAR LENS  IMPLANT, BILATERAL Bilateral 2011  . COLONOSCOPY WITH PROPOFOL N/A 07/27/2016   Procedure: COLONOSCOPY WITH PROPOFOL;  Surgeon: Wilford Corner, MD;  Location: [facility];  Service: Endoscopy;  Laterality: N/A;  . CORONARY ARTERY BYPASS GRAFT  2000   ASCVD, multivessel, S./P.  . DILATION AND CURETTAGE OF UTERUS  1980s  . ESOPHAGOGASTRODUODENOSCOPY (EGD) WITH PROPOFOL N/A 07/27/2016   Procedure: ESOPHAGOGASTRODUODENOSCOPY (EGD) WITH PROPOFOL;  Surgeon: Wilford Corner, MD;  Location: [place];  Service: Endoscopy;  Laterality: N/A;  . FRACTURE SURGERY    . LEFT HEART CATH AND CORS/GRAFTS ANGIOGRAPHY N/A 01/12/2018   Procedure: LEFT HEART CATH AND CORS/GRAFTS ANGIOGRAPHY;  Surgeon: Jettie Booze, MD;  Location: [place];  Service: Cardiovascular;  Laterality: N/A;  . PATELLA FRACTURE SURGERY Left 1993    Allergies  Allergen Reactions  . Nsaids Other (See Comments)    Due to chronic kidney failure  . Butorphanol Other (See Comments)    agitation Constipation Produced a lot of urine, made patient feel crazy  . Sulfa Antibiotics Rash  . Bisoprolol Fumarate Other (See Comments)    Doesn't remember  . Butorphanol Tartrate Other (See Comments)    Produced a lot of urine, made patient feel crazy  . Codeine Nausea And Vomiting  . Demerol [Meperidine] Nausea And Vomiting  . Imipramine     Sweating, facial dysfunction   . Meperidine And Related Nausea And Vomiting  . Statins     MYALGIAS  . Tequin [Gatifloxacin] Other (See Comments)    Caused hypoglycemia Low blood sugar  . Brilinta [Ticagrelor] Rash    CAUSES PETECHIAE, PURPURA  . Pseudoephedrine Hcl Palpitations  . Septra [Sulfamethoxazole-Trimethoprim] Rash  . Sulfamethoxazole-Trimethoprim  Rash    Allergies as of 01/23/2019      Reactions   Nsaids Other (See Comments)   Due to chronic kidney failure   Butorphanol Other (See Comments)   agitation Constipation Produced a lot of urine, made patient feel crazy   Sulfa Antibiotics Rash   Bisoprolol Fumarate Other (See Comments)   Doesn't remember   Butorphanol Tartrate Other (See Comments)   Produced a lot of urine, made patient feel crazy   Codeine Nausea And Vomiting   Demerol [meperidine] Nausea And Vomiting   Imipramine    Sweating, facial dysfunction    Meperidine And Related Nausea And Vomiting   Statins    MYALGIAS   Tequin [gatifloxacin] Other (See Comments)   Caused hypoglycemia Low blood sugar   Brilinta [ticagrelor] Rash   CAUSES PETECHIAE, PURPURA   Pseudoephedrine Hcl Palpitations   Septra [sulfamethoxazole-trimethoprim] Rash   Sulfamethoxazole-trimethoprim Rash      Medication List       Accurate as of January 23, 2019 11:59 PM. If you have any questions, ask your nurse or doctor.  alendronate 70 MG tablet Commonly known as: FOSAMAX Take 70 mg by mouth every Sunday.   atorvastatin 10 MG tablet Commonly known as: LIPITOR TAKE 1 TABLET ONCE DAILY.   calcium carbonate 600 MG Tabs tablet Commonly known as: OS-CAL Take 600 mg by mouth 2 (two) times daily with a meal.   clopidogrel 75 MG tablet Commonly known as: PLAVIX TAKE 1 TABLET ONCE DAILY.   fluticasone 50 MCG/ACT nasal spray Commonly known as: FLONASE Place 2 sprays into both nostrils daily as needed for allergies.   Glucosamine 1500 Complex Caps Take 2 capsules by mouth daily after breakfast.   isosorbide mononitrate 60 MG 24 hr tablet Commonly known as: IMDUR Take 2 tablets (120 mg total) by mouth daily.   levocetirizine 5 MG tablet Commonly known as: XYZAL TAKE 1 TABLET ONCE DAILY IN THE EVENING.   levothyroxine 75 MCG tablet Commonly known as: SYNTHROID TAKE 1 TABLET ONCE DAILY ON EMPTY STOMACH.   metoprolol  succinate 50 MG 24 hr tablet Commonly known as: TOPROL-XL Take 1.5 tablets orally once a day. Take with or immediately following a meal.   Myrbetriq 50 MG Tb24 tablet Generic drug: mirabegron ER TAKE 1 TABLET BY MOUTH DAILY.   nitroGLYCERIN 0.4 MG SL tablet Commonly known as: NITROSTAT Place 0.4 mg under the tongue every 5 (five) minutes as needed for chest pain. X 3 doses   pantoprazole 40 MG tablet Commonly known as: PROTONIX Take 1 tablet (40 mg total) by mouth daily.   predniSONE 5 MG tablet Commonly known as: DELTASONE Take 5 mg by mouth daily with breakfast. Once a day on Mon, Tues, Thurs, Fri, and Sat.   ranolazine 1000 MG SR tablet Commonly known as: RANEXA TAKE 1 TABLET BY MOUTH TWICE DAILY.   venlafaxine XR 75 MG 24 hr capsule Commonly known as: EFFEXOR-XR TAKE 3 CAPSULES ONCE A DAY.   Vitamin D 50 MCG (2000 UT) tablet Take 2,000 Units by mouth daily.       Review of Systems  Constitutional: Negative for activity change, appetite change, chills, diaphoresis, fatigue and fever.  HENT: Positive for hearing loss. Negative for congestion and voice change.   Eyes: Negative for visual disturbance.  Respiratory: Positive for shortness of breath. Negative for cough and wheezing.        DOE  Cardiovascular: Negative for chest pain and palpitations.  Gastrointestinal: Negative for abdominal distention, abdominal pain, constipation, diarrhea, nausea and vomiting.  Genitourinary: Negative for difficulty urinating and urgency.  Musculoskeletal: Positive for arthralgias, gait problem and myalgias.  Skin: Positive for wound. Negative for color change.       R forearm skin tear  Neurological: Negative for dizziness, speech difficulty, weakness and headaches.  Psychiatric/Behavioral: Negative for agitation, behavioral problems, hallucinations and self-injury. The patient is not nervous/anxious.     Immunization History  Administered Date(s) Administered  . HPV Bivalent  11/24/2013, 01/17/2016  . Influenza Whole 12/23/2017  . Influenza-Unspecified 01/10/2013, 12/27/2014, 12/30/2016  . Pneumococcal Conjugate-13 04/21/2013  . Pneumococcal Polysaccharide-23 02/27/1993, 03/23/2009  . Tdap 04/20/2012  . Zoster 11/10/2010   Pertinent  Health Maintenance Due  Topic Date Due  . INFLUENZA VACCINE  10/22/2018  . DEXA SCAN  Completed  . PNA vac Low Risk Adult  Completed   Fall Risk  10/06/2018 08/18/2018 01/27/2018 12/07/2017  Falls in the past year? 1 1 1  Yes  Number falls in past yr: 0 0 1 2 or more  Injury with Fall? 0 1 0 No   Functional  Status Survey:    Vitals:   01/23/19 1341  BP: 124/78  Pulse: 76  Resp: 20  Temp: 98 F (36.7 C)  SpO2: 96%   There is no height or weight on file to calculate BMI. Physical Exam Vitals signs and nursing note reviewed.  Constitutional:      General: She is not in acute distress.    Appearance: Normal appearance. She is not ill-appearing, toxic-appearing or diaphoretic.  HENT:     Head: Normocephalic and atraumatic.     Nose: Nose normal.     Mouth/Throat:     Mouth: Mucous membranes are moist.  Eyes:     Extraocular Movements: Extraocular movements intact.     Conjunctiva/sclera: Conjunctivae normal.     Pupils: Pupils are equal, round, and reactive to light.  Neck:     Musculoskeletal: Normal range of motion and neck supple.  Cardiovascular:     Rate and Rhythm: Normal rate and regular rhythm.     Heart sounds: No gallop.   Pulmonary:     Breath sounds: No wheezing, rhonchi or rales.  Abdominal:     General: Bowel sounds are normal. There is no distension.     Palpations: Abdomen is soft.     Tenderness: There is no abdominal tenderness. There is no right CVA tenderness, left CVA tenderness, guarding or rebound.  Musculoskeletal:     Right lower leg: No edema.     Left lower leg: No edema.     Comments: Trace edema BLE  Skin:    General: Skin is warm and dry.     Comments: Right forearm skin tear,  well approximated with steri strips, no s/s of infectin  Neurological:     General: No focal deficit present.     Mental Status: She is alert and oriented to person, place, and time. Mental status is at baseline.     Cranial Nerves: No cranial nerve deficit.     Motor: No weakness.     Coordination: Coordination normal.     Gait: Gait abnormal.  Psychiatric:        Mood and Affect: Mood normal.        Behavior: Behavior normal.        Thought Content: Thought content normal.        Judgment: Judgment normal.     Labs reviewed: Recent Labs    10/13/18 2040 10/14/18 0229 10/15/18 0226 10/20/18  NA 138 140 139 139  K 4.1 4.2 3.8 3.9  CL 106 110 107 106  CO2 21* 19* 22 23  GLUCOSE 98 84 85  --   BUN 31* 28* 22 36*  CREATININE 1.62* 1.40* 1.34* 1.3*  CALCIUM 8.7* 8.6* 8.9 9.3   Recent Labs    06/07/18 0710 07/17/18 2157 07/26/18 10/20/18  AST 10 14* 8* 10*  ALT 7 10 7 8   ALKPHOS  --  41 45 44  BILITOT 0.3 0.3  --   --   PROT 6.0* 6.0*  --  5.9  ALBUMIN  --  3.4*  --  3.6   Recent Labs    07/17/18 2157 07/18/18 0426 07/19/18 0342 07/26/18 10/13/18 2040 10/20/18  WBC 14.4* 12.1* 10.8* 13.1 11.4* 11.7  NEUTROABS 12.7*  --   --   --  8.3*  --   HGB 10.6* 10.2* 10.6* 10.6* 11.3* 11.1*  HCT 33.9* 32.7* 33.6* 32* 35.8* 34*  MCV 99.1 98.2 98.5  --  97.3  --   PLT  230 224 224 265 253 277   Lab Results  Component Value Date   TSH 0.775 07/18/2018   Lab Results  Component Value Date   HGBA1C 5.6 01/24/2018   Lab Results  Component Value Date   CHOL 178 10/20/2018   HDL 201 (A) 10/20/2018   LDLCALC 99 10/20/2018   TRIG 55 10/20/2018   CHOLHDL 2.1 06/07/2018    Significant Diagnostic Results in last 30 days:  No results found.  Assessment/Plan: Skin tear of elbow without complication Right forearm, no s/s of infection or active bleeding, it should heal, observe.   CAD (coronary artery disease) Stable, continue Ranexa 100mg  bid, Isosorbide 120mg  qd, Plavix  75mg  qd.   Polymyalgia rheumatica (HCC) Stable, continue Prednisone 5mg  qd 5x/wk.   Essential hypertension Blood pressure is controlled, continue Metoprolol 75mg  qd.   Osteoporosis No recent fractures, last DEXA 2018 indicated low bone density,  continue Ca, Vit, Fosamax, update DEXA    Family/ staff Communication: plan of care reviewed with the patient and charge nurse.   Labs/tests ordered:  DEXA   Time spend 40 minutes.

## 2019-01-23 NOTE — Assessment & Plan Note (Signed)
Stable, continue Ranexa 100mg  bid, Isosorbide 120mg  qd, Plavix 75mg  qd.

## 2019-01-23 NOTE — Assessment & Plan Note (Signed)
Right forearm, no s/s of infection or active bleeding, it should heal, observe.

## 2019-01-23 NOTE — Assessment & Plan Note (Addendum)
Blood pressure is controlled, continue Metoprolol 75mg  qd.

## 2019-01-24 ENCOUNTER — Encounter: Payer: Self-pay | Admitting: Nurse Practitioner

## 2019-01-24 NOTE — Assessment & Plan Note (Addendum)
No recent fractures, last DEXA 2018 indicated low bone density,  continue Ca, Vit, Fosamax, update DEXA

## 2019-02-10 ENCOUNTER — Non-Acute Institutional Stay: Payer: Medicare Other | Admitting: Internal Medicine

## 2019-02-10 ENCOUNTER — Encounter: Payer: Self-pay | Admitting: Internal Medicine

## 2019-02-10 ENCOUNTER — Other Ambulatory Visit: Payer: Self-pay

## 2019-02-10 VITALS — BP 112/50 | HR 66 | Temp 95.9°F | Ht <= 58 in | Wt 195.2 lb

## 2019-02-10 DIAGNOSIS — N1831 Chronic kidney disease, stage 3a: Secondary | ICD-10-CM

## 2019-02-10 DIAGNOSIS — E039 Hypothyroidism, unspecified: Secondary | ICD-10-CM

## 2019-02-10 DIAGNOSIS — I2510 Atherosclerotic heart disease of native coronary artery without angina pectoris: Secondary | ICD-10-CM

## 2019-02-10 DIAGNOSIS — M8000XA Age-related osteoporosis with current pathological fracture, unspecified site, initial encounter for fracture: Secondary | ICD-10-CM

## 2019-02-10 DIAGNOSIS — I10 Essential (primary) hypertension: Secondary | ICD-10-CM | POA: Diagnosis not present

## 2019-02-10 DIAGNOSIS — D649 Anemia, unspecified: Secondary | ICD-10-CM

## 2019-02-10 DIAGNOSIS — M353 Polymyalgia rheumatica: Secondary | ICD-10-CM | POA: Diagnosis not present

## 2019-02-10 DIAGNOSIS — E785 Hyperlipidemia, unspecified: Secondary | ICD-10-CM

## 2019-02-10 DIAGNOSIS — F339 Major depressive disorder, recurrent, unspecified: Secondary | ICD-10-CM

## 2019-02-10 DIAGNOSIS — G4733 Obstructive sleep apnea (adult) (pediatric): Secondary | ICD-10-CM

## 2019-02-10 NOTE — Progress Notes (Signed)
Location: [facility] of Service:  Clinic (12)  Provider:   Code Status:  Goals of Care:  Advanced Directives 10/17/2018  Does Patient Have a Medical Advance Directive? Yes  Type of Advance Directive Out of facility DNR (pink MOST or yellow form)  Does patient want to make changes to medical advance directive? No - Patient declined  Copy of [place] in Chart? -  Would patient like information on creating a medical advance directive? No - Patient declined  Pre-existing out of facility DNR order (yellow form or pink MOST form) Yellow form placed in chart (order not valid for inpatient use);Pink MOST form placed in chart (order not valid for inpatient use)     Chief Complaint  Patient presents with  . Medical Management of Chronic Issues    Patient got flu vaccine, unsure of date. She would like to discuss her muscle soreness and the prednisone. She complains of some recent dizzniness with diarrhea.     HPI: Patient is a 78 y.o. female seen today for an Routine Visit for her multiple Problems  Patient has h/o Obesity with OSA, Chronic Diastolic CHF, CAD s/p CABG 2000 andstentplacement,PMR on chronic Steroids and Hypothyroidism, chronic diastolic CHF and hyperlipidemia  PMR follows with Rheumatology Patient was suppose to be in Prednisone '5mg'$  alternating with 2.5 mg but somehow she was taken off it and not restarted by Facility Nurse Patient today c/o Pain and weakness in her Proximal Muscles CAD Was admitted for precordial chest pain in 7/23 to 7/25  Her nuclear stress test was negative for any acute ischemia. But showed previous infarction with EF of 50% No Chest Pain since her discharge C/o Diarrhea  Almost resolved now. H/o IBS Depression  Stable and Adjusted to AL room now  Patient walks with no cane or walker. No Falls recently  Past Medical History:  Diagnosis Date  . Anemia   . Arthritis    "fingers, back, shoulders, hips"  (04/10/2016)  . Chronic diastolic CHF (congestive heart failure) (HCC)    a. normal EF, LVEDP at [facility] in 6/17 33 >> Lasix started   . CKD (chronic kidney disease), stage III   . Coronary artery disease    a. s/p CABG 2000 (SVG/ free LIMA Y graft to the diagonal and distal LAD, SVG to the OM 1, and SVG to the PDA) . b. Cath 12/19/2014 90% dSVG to RCA s/p 2 overlapping DES. c. LHC 2016, 2017 no intervention except diuresis needed. d. Low risk nuc 03/2016. e. Cath 12/2017 patent LIMA-LAD, SVG-OM, SVG-PDA but occluded SVG to diag.   . Depression    with anxious component  . GERD (gastroesophageal reflux disease)   . History of hiatal hernia   . Hyperlipidemia   . Hypothyroidism   . Obesity   . OSA on CPAP   . PMR (polymyalgia rheumatica) (HCC)   . Pneumonia    "2-3 times" (04/10/2016)  . Stable angina (HCC)    microvascular, improved with Ranexa  . Subclavian artery stenosis (HCC)    a. L by cath note in 2016.  Marland Kitchen TIA (transient ischemic attack)     Past Surgical History:  Procedure Laterality Date  . BREAST BIOPSY Right 1964  . CARDIAC CATHETERIZATION  08   patent grafts, no culprit lesions, EF 65%  . CARDIAC CATHETERIZATION N/A 12/19/2014   Procedure: Left Heart Cath and Cors/Grafts Angiography;  Surgeon: Jettie Booze, MD;  Location: [place] [facility];  Service: Cardiovascular;  Laterality: N/A;  . CARDIAC CATHETERIZATION N/A 12/19/2014   Procedure: Coronary Stent Intervention;  Surgeon: Jettie Booze, MD;  Location: [place] [facility];  Service: Cardiovascular;  Laterality: N/A;  . CARDIAC CATHETERIZATION N/A 01/01/2015   Procedure: Left Heart Cath and Cors/Grafts Angiography;  Surgeon: Jettie Booze, MD;  Location: [place] [facility];  Service: Cardiovascular;  Laterality: N/A;  . CARDIAC CATHETERIZATION N/A 09/20/2015   Procedure: Left Heart Cath and Cors/Grafts Angiography;  Surgeon: Jettie Booze, MD;  Location: [place] [facility];  Service: Cardiovascular;   Laterality: N/A;  . CATARACT EXTRACTION W/ INTRAOCULAR LENS  IMPLANT, BILATERAL Bilateral 2011  . COLONOSCOPY WITH PROPOFOL N/A 07/27/2016   Procedure: COLONOSCOPY WITH PROPOFOL;  Surgeon: Wilford Corner, MD;  Location: [facility];  Service: Endoscopy;  Laterality: N/A;  . CORONARY ARTERY BYPASS GRAFT  2000   ASCVD, multivessel, S./P.  . DILATION AND CURETTAGE OF UTERUS  1980s  . ESOPHAGOGASTRODUODENOSCOPY (EGD) WITH PROPOFOL N/A 07/27/2016   Procedure: ESOPHAGOGASTRODUODENOSCOPY (EGD) WITH PROPOFOL;  Surgeon: Wilford Corner, MD;  Location: [place];  Service: Endoscopy;  Laterality: N/A;  . FRACTURE SURGERY    . LEFT HEART CATH AND CORS/GRAFTS ANGIOGRAPHY N/A 01/12/2018   Procedure: LEFT HEART CATH AND CORS/GRAFTS ANGIOGRAPHY;  Surgeon: Jettie Booze, MD;  Location: [place] [facility];  Service: Cardiovascular;  Laterality: N/A;  . PATELLA FRACTURE SURGERY Left 1993    Allergies  Allergen Reactions  . Nsaids Other (See Comments)    Due to chronic kidney failure  . Butorphanol Other (See Comments)    agitation Constipation Produced a lot of urine, made patient feel crazy  . Sulfa Antibiotics Rash  . Bisoprolol Fumarate Other (See Comments)    Doesn't remember  . Butorphanol Tartrate Other (See Comments)    Produced a lot of urine, made patient feel crazy  . Codeine Nausea And Vomiting  . Demerol [Meperidine] Nausea And Vomiting  . Imipramine     Sweating, facial dysfunction   . Meperidine And Related Nausea And Vomiting  . Statins     MYALGIAS  . Tequin [Gatifloxacin] Other (See Comments)    Caused hypoglycemia Low blood sugar  . Brilinta [Ticagrelor] Rash    CAUSES PETECHIAE, PURPURA  . Pseudoephedrine Hcl Palpitations  . Septra [Sulfamethoxazole-Trimethoprim] Rash  . Sulfamethoxazole-Trimethoprim Rash    Outpatient Encounter Medications as of 02/10/2019  Medication Sig  . alendronate (FOSAMAX) 70 MG tablet Take 70 mg by mouth every Sunday.   Marland Kitchen  atorvastatin (LIPITOR) 10 MG tablet TAKE 1 TABLET ONCE DAILY.  . calcium carbonate (OS-CAL) 600 MG TABS tablet Take 600 mg by mouth 2 (two) times daily with a meal.  . Cholecalciferol (VITAMIN D) 50 MCG (2000 UT) tablet Take 2,000 Units by mouth daily.  . clopidogrel (PLAVIX) 75 MG tablet TAKE 1 TABLET ONCE DAILY.  . fluticasone (FLONASE) 50 MCG/ACT nasal spray Place 2 sprays into both nostrils daily as needed for allergies.   . Glucosamine-Chondroit-Vit C-Mn (GLUCOSAMINE 1500 COMPLEX) CAPS Take 2 capsules by mouth daily after breakfast.  . isosorbide mononitrate (IMDUR) 60 MG 24 hr tablet Take 2 tablets (120 mg total) by mouth daily.  Marland Kitchen levocetirizine (XYZAL) 5 MG tablet TAKE 1 TABLET ONCE DAILY IN THE EVENING.  Marland Kitchen levothyroxine (SYNTHROID) 75 MCG tablet TAKE 1 TABLET ONCE DAILY ON EMPTY STOMACH.  . metoprolol succinate (TOPROL-XL) 50 MG 24 hr tablet Take 1.5 tablets orally once a day. Take with or immediately following a meal.  .  MYRBETRIQ 50 MG TB24 tablet TAKE 1 TABLET BY MOUTH DAILY.  . nitroGLYCERIN (NITROSTAT) 0.4 MG SL tablet Place 0.4 mg under the tongue every 5 (five) minutes as needed for chest pain. X 3 doses  . pantoprazole (PROTONIX) 40 MG tablet Take 1 tablet (40 mg total) by mouth daily.  . predniSONE (DELTASONE) 5 MG tablet Take 5 mg by mouth daily with breakfast. Once a day on Mon, Tues, Thurs, Fri, and Sat.  . ranolazine (RANEXA) 1000 MG SR tablet TAKE 1 TABLET BY MOUTH TWICE DAILY.  Marland Kitchen venlafaxine (EFFEXOR) 75 MG tablet Take 75 mg by mouth 2 (two) times daily.  . [DISCONTINUED] venlafaxine XR (EFFEXOR-XR) 75 MG 24 hr capsule TAKE 3 CAPSULES ONCE A DAY.   No facility-administered encounter medications on file as of 02/10/2019.     Review of Systems:  Review of Systems  Constitutional: Negative.   HENT: Negative.   Respiratory: Negative.   Cardiovascular: Negative.   Gastrointestinal: Negative.   Genitourinary: Negative.   Musculoskeletal: Positive for arthralgias and  myalgias.  Neurological: Negative.   Psychiatric/Behavioral: Positive for confusion and dysphoric mood.    Health Maintenance  Topic Date Due  . INFLUENZA VACCINE  10/22/2018  . TETANUS/TDAP  04/20/2022  . DEXA SCAN  Completed  . PNA vac Low Risk Adult  Completed    Physical Exam: Vitals:   02/10/19 0944  BP: (!) 112/50  Pulse: 66  Temp: (!) 95.9 F (35.5 C)  SpO2: 98%  Weight: 195 lb 3.2 oz (88.5 kg)  Height: _0  (1.473 m)   Body mass index is 40.8 kg/m. Physical Exam Vitals signs reviewed.  Constitutional:      Appearance: She is obese.  HENT:     Head: Normocephalic.     Nose: Nose normal.     Mouth/Throat:     Mouth: Mucous membranes are moist.     Pharynx: Oropharynx is clear.  Eyes:     Pupils: Pupils are equal, round, and reactive to light.  Neck:     Musculoskeletal: Neck supple.  Cardiovascular:     Rate and Rhythm: Normal rate and regular rhythm.     Heart sounds: Murmur present.  Pulmonary:     Effort: Pulmonary effort is normal. No respiratory distress.     Breath sounds: Normal breath sounds. No wheezing or rales.  Abdominal:     General: Abdomen is flat. Bowel sounds are normal.     Palpations: Abdomen is soft.  Musculoskeletal:        General: No swelling.  Skin:    General: Skin is warm.  Neurological:     General: No focal deficit present.     Mental Status: She is alert and oriented to person, place, and time.  Psychiatric:        Mood and Affect: Mood normal.        Thought Content: Thought content normal.        Judgment: Judgment normal.     Labs reviewed: Basic Metabolic Panel: Recent Labs    06/07/18 0710  07/18/18 0426  10/13/18 2040 10/14/18 0229 10/15/18 0226 10/20/18  NA 142   < > 139   < > 138 140 139 139  K 3.9   < > 3.8   < > 4.1 4.2 3.8 3.9  CL 106   < > 108   < > 106 110 107 106  CO2 20   < > 23   < > 21* 19* 22 23  GLUCOSE 89   < > 100*   < > 98 84 85  --   BUN 27*   < > 29*   < > 31* 28* 22 36*   CREATININE 1.23*   < > 1.12*   < > 1.62* 1.40* 1.34* 1.3*  CALCIUM 8.9   < > 8.5*   < > 8.7* 8.6* 8.9 9.3  TSH 1.33  --  0.775  --   --   --   --   --    < > = values in this interval not displayed.   Liver Function Tests: Recent Labs    06/07/18 0710 07/17/18 2157 07/26/18 10/20/18  AST 10 14* 8* 10*  ALT _0 ALKPHOS  --  41 45 44  BILITOT 0.3 0.3  --   --   PROT 6.0* 6.0*  --  5.9  ALBUMIN  --  3.4*  --  3.6   No results for input(s): LIPASE, AMYLASE in the last 8760 hours. No results for input(s): AMMONIA in the last 8760 hours. CBC: Recent Labs    07/17/18 2157 07/18/18 0426 07/19/18 0342 07/26/18 10/13/18 2040 10/20/18  WBC 14.4* 12.1* 10.8* 13.1 11.4* 11.7  NEUTROABS 12.7*  --   --   --  8.3*  --   HGB 10.6* 10.2* 10.6* 10.6* 11.3* 11.1*  HCT 33.9* 32.7* 33.6* 32* 35.8* 34*  MCV 99.1 98.2 98.5  --  97.3  --   PLT 230 224 224 265 253 277   Lipid Panel: Recent Labs    06/07/18 0710 10/20/18  CHOL 159 178  HDL 76 201*  LDLCALC 65 99  TRIG 96 55  CHOLHDL 2.1  --    Lab Results  Component Value Date   HGBA1C 5.6 01/24/2018    Procedures since last visit: No results found.  Assessment/Plan  Polymyalgia rheumatica ([place]) since 2014 D/w AL nurse Patient is back o Prednisone 38m /with 2.5 mg Will check ESR and CRp She will call her Rheumatology for follow up   Age-related osteoporosis  Stable on Fosamax since 2018 DEXA scan in 2018 Tscore -1.5  Coronary artery disease Follows closely with Cardiology On iMdur,Ranexa,Plavix and beta blocker  Stage 3a chronic kidney disease Creat is stable Repeat  BMP  Hyperlipidemia LDL goal <70 On Statin Repeat Lipid Panel  Depression, recurrent (HCC) Stable on Effexor  Obstructive sleep apnea syndrome ON CPAP  Hypothyroidism, unspecified type Repeat TSH  Anemia, unspecified type Repeat CBC H/o IBS Has Alternate diarrhea .C/O Confusion Ordered MMSE   Labs/tests ordered:  CBC,CMP,CRP,TSH  and Lipid Panel  Follow up in 3 months Next appt:  Visit date not found  Total time spent in this patient care encounter was  45_  minutes; greater than 50% of the visit spent counseling patient and staff, reviewing records , Labs and coordinating care for problems addressed at this encounter.

## 2019-02-14 LAB — HEPATIC FUNCTION PANEL
ALT: 7 (ref 7–35)
AST: 10 — AB (ref 13–35)
Alkaline Phosphatase: 41 (ref 25–125)
Bilirubin, Total: 0.4

## 2019-02-14 LAB — BASIC METABOLIC PANEL
BUN: 22 — AB (ref 4–21)
CO2: 25 — AB (ref 13–22)
Chloride: 107 (ref 99–108)
Creatinine: 1.2 — AB (ref 0.5–1.1)
Glucose: 86
Potassium: 5 (ref 3.4–5.3)
Sodium: 141 (ref 137–147)

## 2019-02-14 LAB — COMPREHENSIVE METABOLIC PANEL
Albumin: 3.6 (ref 3.5–5.0)
Calcium: 9 (ref 8.7–10.7)
Globulin: 2

## 2019-02-14 LAB — LIPID PANEL
Cholesterol: 181 (ref 0–200)
HDL: 54 (ref 35–70)
LDL Cholesterol: 99
LDl/HDL Ratio: 3.4
Triglycerides: 185 — AB (ref 40–160)

## 2019-02-14 LAB — HEMOGLOBIN A1C: Hemoglobin A1C: 5.1

## 2019-02-14 LAB — CBC AND DIFFERENTIAL
HCT: 31 — AB (ref 36–46)
Hemoglobin: 10.2 — AB (ref 12.0–16.0)
Platelets: 254 (ref 150–399)
WBC: 7.8

## 2019-02-14 LAB — CBC: RBC: 3.19 — AB (ref 3.87–5.11)

## 2019-02-14 LAB — TSH: TSH: 1.42 (ref 0.41–5.90)

## 2019-02-14 LAB — POCT ERYTHROCYTE SEDIMENTATION RATE, NON-AUTOMATED: Sed Rate: 17

## 2019-05-01 ENCOUNTER — Non-Acute Institutional Stay: Payer: Medicare Other | Admitting: Internal Medicine

## 2019-05-01 ENCOUNTER — Encounter: Payer: Self-pay | Admitting: Internal Medicine

## 2019-05-01 DIAGNOSIS — I2510 Atherosclerotic heart disease of native coronary artery without angina pectoris: Secondary | ICD-10-CM

## 2019-05-01 DIAGNOSIS — M353 Polymyalgia rheumatica: Secondary | ICD-10-CM

## 2019-05-01 DIAGNOSIS — S62525A Nondisplaced fracture of distal phalanx of left thumb, initial encounter for closed fracture: Secondary | ICD-10-CM

## 2019-05-01 DIAGNOSIS — I10 Essential (primary) hypertension: Secondary | ICD-10-CM

## 2019-05-01 LAB — T4, FREE: Free T4: 1.1

## 2019-05-01 LAB — C REACTIVE PROTEIN, FLUID: CRP: 4.2

## 2019-05-01 NOTE — Progress Notes (Signed)
Location:   [place] Room Number: [place]:  ALF [phone]) Provider:   Veleta Miners MD  Mast, Man X, NP  Patient Care Team: Mast, Man X, NP as PCP - General (Internal Medicine) Jettie Booze, MD as PCP - Cardiology (Cardiology) Wilford Corner, MD as Consulting Physician (Gastroenterology) Marlou Sa, Tonna Corner, MD as Consulting Physician (Orthopedic Surgery) Marcial Pacas, MD as Consulting Physician (Neurology) Franchot Gallo, MD as Consulting Physician (Urology) Kary Kos, MD as Consulting Physician (Neurosurgery)  Extended Emergency Contact Information Primary Emergency Contact: Merlinda Frederick Address: [address]          [place] [address] [place] Phone: [phone] Mobile Phone: [phone] Relation: Spouse Secondary Emergency Contact: Palmer Mobile Phone: [phone] Relation: Son  Code Status:  DNR Goals of care: Advanced Directive information Advanced Directives 05/01/2019  Does Patient Have a Medical Advance Directive? Yes  Type of Advance Directive Out of facility DNR (pink MOST or yellow form)  Does patient want to make changes to medical advance directive? No - Patient declined  Copy of [place] in Chart? -  Would patient like information on creating a medical advance directive? -  Pre-existing out of facility DNR order (yellow form or pink MOST form) Yellow form placed in chart (order not valid for inpatient use);Pink MOST form placed in chart (order not valid for inpatient use)     Chief Complaint  Patient presents with  . Acute Visit    Fall with injury    HPI:  Pt is a 79 y.o. female seen today for an acute visit for Fall with Left Thumb Swelling  Patient has h/o Obesity with OSA, Chronic Diastolic CHF, CAD s/p [id number] on chronic Steroids and Hypothyroidism,chronic diastolic CHF and hyperlipidemia  Patient fell in  her room. It was mechanical fall. Patient denies any Syncope or Dizziness.  No Leg weakness. She was noticed to have swollen swelling in her Left Thumb Xray now shows she has Acute Avulsion Fracture of First IP joint She denies any Pain. Just swelling and Bruise  Past Medical History:  Diagnosis Date  . Anemia   . Arthritis    "fingers, back, shoulders, hips" (04/10/2016)  . Chronic diastolic CHF (congestive heart failure) (HCC)    a. normal EF, LVEDP at [facility] in 6/17 33 >> Lasix started   . CKD (chronic kidney disease), stage III   . Coronary artery disease    a. s/p CABG 2000 (SVG/ free LIMA Y graft to the diagonal and distal LAD, SVG to the OM 1, and SVG to the PDA) . b. Cath 12/19/2014 90% dSVG to RCA s/p 2 overlapping DES. c. LHC 2016, 2017 no intervention except diuresis needed. d. Low risk nuc 03/2016. e. Cath 12/2017 patent LIMA-LAD, SVG-OM, SVG-PDA but occluded SVG to diag.   . Depression    with anxious component  . GERD (gastroesophageal reflux disease)   . History of hiatal hernia   . Hyperlipidemia   . Hypothyroidism   . Obesity   . OSA on CPAP   . PMR (polymyalgia rheumatica) (HCC)   . Pneumonia    "2-3 times" (04/10/2016)  . Stable angina (HCC)    microvascular, improved with Ranexa  . Subclavian artery stenosis (HCC)    a. L by cath note in 2016.  Marland Kitchen TIA (transient ischemic attack)    Past Surgical History:  Procedure Laterality Date  . BREAST BIOPSY Right 1964  . CARDIAC  CATHETERIZATION  08   patent grafts, no culprit lesions, EF 65%  . CARDIAC CATHETERIZATION N/A 12/19/2014   Procedure: Left Heart Cath and Cors/Grafts Angiography;  Surgeon: Jettie Booze, MD;  Location: [place] [facility];  Service: Cardiovascular;  Laterality: N/A;  . CARDIAC CATHETERIZATION N/A 12/19/2014   Procedure: Coronary Stent Intervention;  Surgeon: Jettie Booze, MD;  Location: [place] [facility];  Service: Cardiovascular;  Laterality: N/A;  . CARDIAC CATHETERIZATION N/A  01/01/2015   Procedure: Left Heart Cath and Cors/Grafts Angiography;  Surgeon: Jettie Booze, MD;  Location: [place] [facility];  Service: Cardiovascular;  Laterality: N/A;  . CARDIAC CATHETERIZATION N/A 09/20/2015   Procedure: Left Heart Cath and Cors/Grafts Angiography;  Surgeon: Jettie Booze, MD;  Location: [place] [facility];  Service: Cardiovascular;  Laterality: N/A;  . CATARACT EXTRACTION W/ INTRAOCULAR LENS  IMPLANT, BILATERAL Bilateral 2011  . COLONOSCOPY WITH PROPOFOL N/A 07/27/2016   Procedure: COLONOSCOPY WITH PROPOFOL;  Surgeon: Wilford Corner, MD;  Location: [place];  Service: Endoscopy;  Laterality: N/A;  . CORONARY ARTERY BYPASS GRAFT  2000   ASCVD, multivessel, S./P.  . DILATION AND CURETTAGE OF UTERUS  1980s  . ESOPHAGOGASTRODUODENOSCOPY (EGD) WITH PROPOFOL N/A 07/27/2016   Procedure: ESOPHAGOGASTRODUODENOSCOPY (EGD) WITH PROPOFOL;  Surgeon: Wilford Corner, MD;  Location: [place];  Service: Endoscopy;  Laterality: N/A;  . FRACTURE SURGERY    . LEFT HEART CATH AND CORS/GRAFTS ANGIOGRAPHY N/A 01/12/2018   Procedure: LEFT HEART CATH AND CORS/GRAFTS ANGIOGRAPHY;  Surgeon: Jettie Booze, MD;  Location: [place] [facility];  Service: Cardiovascular;  Laterality: N/A;  . PATELLA FRACTURE SURGERY Left 1993    Allergies  Allergen Reactions  . Nsaids Other (See Comments)    Due to chronic kidney failure  . Butorphanol Other (See Comments)    agitation Constipation Produced a lot of urine, made patient feel crazy  . Sulfa Antibiotics Rash  . Bisoprolol Fumarate Other (See Comments)    Doesn't remember  . Butorphanol Tartrate Other (See Comments)    Produced a lot of urine, made patient feel crazy  . Codeine Nausea And Vomiting  . Demerol [Meperidine] Nausea And Vomiting  . Imipramine     Sweating, facial dysfunction   . Meperidine And Related Nausea And Vomiting  . Statins     MYALGIAS  . Tequin [Gatifloxacin] Other (See Comments)    Caused  hypoglycemia Low blood sugar  . Brilinta [Ticagrelor] Rash    CAUSES PETECHIAE, PURPURA  . Pseudoephedrine Hcl Palpitations  . Septra [Sulfamethoxazole-Trimethoprim] Rash  . Sulfamethoxazole-Trimethoprim Rash    Allergies as of 05/01/2019      Reactions   Nsaids Other (See Comments)   Due to chronic kidney failure   Butorphanol Other (See Comments)   agitation Constipation Produced a lot of urine, made patient feel crazy   Sulfa Antibiotics Rash   Bisoprolol Fumarate Other (See Comments)   Doesn't remember   Butorphanol Tartrate Other (See Comments)   Produced a lot of urine, made patient feel crazy   Codeine Nausea And Vomiting   Demerol [meperidine] Nausea And Vomiting   Imipramine    Sweating, facial dysfunction    Meperidine And Related Nausea And Vomiting   Statins    MYALGIAS   Tequin [gatifloxacin] Other (See Comments)   Caused hypoglycemia Low blood sugar   Brilinta [ticagrelor] Rash   CAUSES PETECHIAE, PURPURA   Pseudoephedrine Hcl Palpitations   Septra [sulfamethoxazole-trimethoprim] Rash   Sulfamethoxazole-trimethoprim Rash  Medication List       Accurate as of May 01, 2019  1:14 PM. If you have any questions, ask your nurse or doctor.        alendronate 70 MG tablet Commonly known as: FOSAMAX Take 70 mg by mouth every Sunday.   alum & mag hydroxide-simeth [id number] MG/5ML suspension Commonly known as: MAALOX PLUS Take 10 mLs by mouth as needed for indigestion.   atorvastatin 10 MG tablet Commonly known as: LIPITOR TAKE 1 TABLET ONCE DAILY.   Biofreeze 10 % Liqd Generic drug: Menthol (Topical Analgesic) Apply topically as needed.   calcium carbonate 600 MG Tabs tablet Commonly known as: OS-CAL Take 600 mg by mouth 2 (two) times daily with a meal.   clopidogrel 75 MG tablet Commonly known as: PLAVIX TAKE 1 TABLET ONCE DAILY.   COENZYME Q-10 PO Take 10 mg by mouth at bedtime.   fluticasone 50 MCG/ACT nasal spray Commonly known  as: FLONASE Place 2 sprays into both nostrils daily as needed for allergies.   Glucosamine 1500 Complex Caps Take 3 capsules by mouth daily after breakfast.   isosorbide mononitrate 60 MG 24 hr tablet Commonly known as: IMDUR Take 2 tablets (120 mg total) by mouth daily.   levocetirizine 5 MG tablet Commonly known as: XYZAL TAKE 1 TABLET ONCE DAILY IN THE EVENING.   levothyroxine 75 MCG tablet Commonly known as: SYNTHROID TAKE 1 TABLET ONCE DAILY ON EMPTY STOMACH.   Melatonin 1 MG Tabs Take by mouth at bedtime.   metoprolol succinate 50 MG 24 hr tablet Commonly known as: TOPROL-XL Take 1.5 tablets orally once a day. Take with or immediately following a meal.   Myrbetriq 50 MG Tb24 tablet Generic drug: mirabegron ER TAKE 1 TABLET BY MOUTH DAILY.   nitroGLYCERIN 0.4 MG SL tablet Commonly known as: NITROSTAT Place 0.4 mg under the tongue every 5 (five) minutes as needed for chest pain. X 3 doses   nystatin powder Commonly known as: MYCOSTATIN/NYSTOP Apply 1 application topically. apply underneath abd/breast folds As Needed   pantoprazole 40 MG tablet Commonly known as: PROTONIX Take 1 tablet (40 mg total) by mouth daily.   predniSONE 5 MG tablet Commonly known as: DELTASONE Take 5 mg by mouth daily.   ranolazine 1000 MG SR tablet Commonly known as: RANEXA TAKE 1 TABLET BY MOUTH TWICE DAILY.   venlafaxine 75 MG tablet Commonly known as: EFFEXOR Take 150 mg by mouth. 2 caps= 150 mg; oral  Once A Day   Vitamin D 50 MCG (2000 UT) tablet Take 2,000 Units by mouth daily.   zinc oxide 20 % ointment Apply 1 application topically as needed for irritation.       Review of Systems  Review of Systems  Constitutional: Negative for activity change, appetite change, chills, diaphoresis, fatigue and fever.  HENT: Negative for mouth sores, postnasal drip, rhinorrhea, sinus pain and sore throat.   Respiratory: Negative for apnea, cough, chest tightness, shortness of  breath and wheezing.   Cardiovascular: Negative for chest pain, palpitations and leg swelling.  Gastrointestinal: Negative for abdominal distention, abdominal pain, constipation, diarrhea, nausea and vomiting.  Genitourinary: Negative for dysuria and frequency.  Musculoskeletal: Negative for arthralgias, joint swelling and myalgias.  Skin: Negative for rash.  Neurological: Negative for dizziness, syncope, weakness, light-headedness and numbness.  Psychiatric/Behavioral: Negative for behavioral problems, confusion and sleep disturbance.     Immunization History  Administered Date(s) Administered  . HPV Bivalent 11/24/2013, 01/17/2016  . Influenza Whole 12/23/2017  .  Influenza, High Dose Seasonal PF 12/24/2018  . Influenza-Unspecified 01/10/2013, 12/27/2014, 12/30/2016  . Pneumococcal Conjugate-13 04/21/2013  . Pneumococcal Polysaccharide-23 02/27/1993, 03/23/2009  . Tdap 04/20/2012  . Zoster 11/10/2010   Pertinent  Health Maintenance Due  Topic Date Due  . INFLUENZA VACCINE  Completed  . DEXA SCAN  Completed  . PNA vac Low Risk Adult  Completed   Fall Risk  02/10/2019 10/06/2018 08/18/2018 01/27/2018 12/07/2017  Falls in the past year? 0 1 1 1  Yes  Number falls in past yr: 0 0 0 1 2 or more  Injury with Fall? - 0 1 0 No   Functional Status Survey:    Vitals:   05/01/19 1153  BP: (!) 100/50  Pulse: 63  Resp: 20  Temp: 98 F (36.7 C)  SpO2: 95%  Weight: 194 lb 3.2 oz (88.1 kg)  Height: 4\' 10"  (1.473 m)   Body mass index is 40.59 kg/m. Physical Exam Constitutional: Oriented to person, place, and time. Well-developed and well-nourished.  HENT:  Head: Normocephalic.  Mouth/Throat: Oropharynx is clear and moist.  Eyes: Pupils are equal, round, and reactive to light.  Neck: Neck supple.  Cardiovascular: Normal rate and normal heart sounds.  [place] Present Pulmonary/Chest: Effort normal and breath sounds normal. No respiratory distress. No wheezes. She has no rales.    Abdominal: Soft. Bowel sounds are normal. No distension. There is no tenderness. There is no rebound.  Musculoskeletal: No edema. Has swelling and Bruise of left Thumb Cannot move it Lymphadenopathy: none Neurological: Alert and oriented to person, place, and time.  Skin: Skin is warm and dry.  Psychiatric: Normal mood and affect. Behavior is normal. Thought content normal.   Labs reviewed: Recent Labs    10/13/18 2040 10/13/18 2040 10/14/18 0229 10/14/18 0229 10/15/18 0226 10/20/18 0000 02/14/19 0000  NA 138   < > 140   < > 139 139 141  K 4.1   < > 4.2   < > 3.8 3.9 5.0  CL 106   < > 110   < > 107 106 107  CO2 21*   < > 19*   < > 22 23 25*  GLUCOSE 98  --  84  --  85  --   --   BUN 31*   < > 28*   < > 22 36* 22*  CREATININE 1.62*   < > 1.40*   < > 1.34* 1.3* 1.2*  CALCIUM 8.7*   < > 8.6*   < > 8.9 9.3 9.0   < > = values in this interval not displayed.   Recent Labs    06/07/18 0710 06/07/18 0710 07/17/18 2157 07/17/18 2157 07/26/18 0000 10/20/18 0000 02/14/19 0000  AST 10   < > 14*   < > 8* 10* 10*  ALT 7   < > 10   < > 7 8 7   ALKPHOS  --   --  41   < > 45 44 41  BILITOT 0.3  --  0.3  --   --   --   --   PROT 6.0*  --  6.0*  --   --  5.9  --   ALBUMIN  --   --  3.4*  --   --  3.6 3.6   < > = values in this interval not displayed.   Recent Labs    07/17/18 2157 07/17/18 2157 07/18/18 0426 07/18/18 0426 07/19/18 0342 07/26/18 0000 10/13/18 2040 10/20/18 0000 02/14/19 0000  WBC 14.4*   < > 12.1*   < > 10.8*   < > 11.4* 11.7 7.8  NEUTROABS 12.7*  --   --   --   --   --  8.3*  --   --   HGB 10.6*   < > 10.2*   < > 10.6*   < > 11.3* 11.1* 10.2*  HCT 33.9*   < > 32.7*   < > 33.6*   < > 35.8* 34* 31*  MCV 99.1   < > 98.2  --  98.5  --  97.3  --   --   PLT 230   < > 224   < > 224   < > 253 277 254   < > = values in this interval not displayed.   Lab Results  Component Value Date   TSH 1.42 02/14/2019   Lab Results  Component Value Date   HGBA1C 5.1  02/14/2019   Lab Results  Component Value Date   CHOL 181 02/14/2019   HDL 54 02/14/2019   LDLCALC 99 02/14/2019   TRIG 185 (A) 02/14/2019   CHOLHDL 2.1 06/07/2018    Significant Diagnostic Results in last 30 days:  No results found.  Assessment/Plan Left Thumb Avulsion Fracture of IP joint Splint d/w the Nurse to Immobilize it Urgent Ortho Consult Hypertension  BP running on lower side Reduce the Lopressor to 50 mg BID Rest of the issues are stable  Polymyalgia rheumatica (HCC) since 2014 On Prednisone 5mg  QD Per rheumatology   Age-related osteoporosis  Stable on Fosamax since 2018 DEXA scan in 2018 Tscore -1.5  Needs Repeat DEXA  Coronary artery disease Follows closely with Cardiology On iMdur,Ranexa,Plavix and beta blocker  Stage 3a chronic kidney disease Creat is stable  Hyperlipidemia LDL goal <70 On Statin LDL99  Depression, recurrent (HCC) Stable on Effexor  Obstructive sleep apnea syndrome ON CPAP  Hypothyroidism, unspecified type TSH Stable  Anemia, unspecified type Hgb Low but stable H/o IBS Has Alternate diarrhea    Family/ staff Communication:   Labs/tests ordered:

## 2019-05-17 ENCOUNTER — Non-Acute Institutional Stay: Payer: Medicare Other | Admitting: Nurse Practitioner

## 2019-05-17 ENCOUNTER — Encounter: Payer: Self-pay | Admitting: Nurse Practitioner

## 2019-05-17 DIAGNOSIS — M353 Polymyalgia rheumatica: Secondary | ICD-10-CM

## 2019-05-17 DIAGNOSIS — M8000XA Age-related osteoporosis with current pathological fracture, unspecified site, initial encounter for fracture: Secondary | ICD-10-CM

## 2019-05-17 DIAGNOSIS — I2510 Atherosclerotic heart disease of native coronary artery without angina pectoris: Secondary | ICD-10-CM

## 2019-05-17 DIAGNOSIS — E039 Hypothyroidism, unspecified: Secondary | ICD-10-CM

## 2019-05-17 DIAGNOSIS — G4733 Obstructive sleep apnea (adult) (pediatric): Secondary | ICD-10-CM | POA: Diagnosis not present

## 2019-05-17 DIAGNOSIS — K219 Gastro-esophageal reflux disease without esophagitis: Secondary | ICD-10-CM | POA: Diagnosis not present

## 2019-05-17 DIAGNOSIS — F339 Major depressive disorder, recurrent, unspecified: Secondary | ICD-10-CM

## 2019-05-17 DIAGNOSIS — I10 Essential (primary) hypertension: Secondary | ICD-10-CM

## 2019-05-17 DIAGNOSIS — N318 Other neuromuscular dysfunction of bladder: Secondary | ICD-10-CM

## 2019-05-17 NOTE — Assessment & Plan Note (Signed)
No recent fx, continue Alendronate.

## 2019-05-17 NOTE — Assessment & Plan Note (Signed)
Stable, continue Myrbetriq °

## 2019-05-17 NOTE — Progress Notes (Addendum)
Location:   [place] Room Number: [place] of Service:  ALF (13) Provider:  Vanette Noguchi X, NP  Jameel Quant X, NP  Patient Care Team: Kambree Krauss X, NP as PCP - General (Internal Medicine) Jettie Booze, MD as PCP - Cardiology (Cardiology) Wilford Corner, MD as Consulting Physician (Gastroenterology) Marlou Sa, Tonna Corner, MD as Consulting Physician (Orthopedic Surgery) Marcial Pacas, MD as Consulting Physician (Neurology) Franchot Gallo, MD as Consulting Physician (Urology) Kary Kos, MD as Consulting Physician (Neurosurgery)  Extended Emergency Contact Information Primary Emergency Contact: Merlinda Frederick Address: [address]          [place] [address] [place] Phone: [phone] Mobile Phone: [phone] Relation: Spouse Secondary Emergency Contact: [place] Mobile Phone: [phone] Relation: Son  Code Status:  DNR Goals of care: Advanced Directive information Advanced Directives 05/17/2019  Does Patient Have a Medical Advance Directive? Yes  Type of Advance Directive Out of facility DNR (pink MOST or yellow form)  Does patient want to make changes to medical advance directive? No - Guardian declined  Copy of [place] in Chart? -  Would patient like information on creating a medical advance directive? -  Pre-existing out of facility DNR order (yellow form or pink MOST form) Yellow form placed in chart (order not valid for inpatient use);Pink MOST form placed in chart (order not valid for inpatient use)     Chief Complaint  Patient presents with  . Medical Management of Chronic Issues    HPI:  Pt is a 79 y.o. female seen today for medical management of chronic diseases.   The patient resides in [facility] for safety, care assistance, ambulates with walker. Hx of osteoporosis, no recent fx, on Alendronate, Vit D, Ca. CAD, no angina, on Isosorbide '120mg'$  qd. Hypothyroidism,  stable, on Levothyroxine 25mg qd. HTN, blood pressure is controlled on Metoprolol '50mg'$  bid. Urinary frequency, stable, on Myrbetriq '50mg'$  qd. GERD, stable, on Pantoprazole '40mg'$  qd. PMR, stable, on Prednisone. Her mood is stable, on Venlafaxine '150mg'$  qd.    Past Medical History:  Diagnosis Date  . Anemia   . Arthritis    "fingers, back, shoulders, hips" (04/10/2016)  . Chronic diastolic CHF (congestive heart failure) (HCC)    a. normal EF, [facility] >> Lasix started   . CKD (chronic kidney disease), stage III   . Coronary artery disease    a. s/p CABG 2000 (SVG/ free LIMA Y graft to the diagonal and distal LAD, SVG to the OM 1, and SVG to the PDA) . b. Cath 12/19/2014 90% dSVG to RCA s/p 2 overlapping DES. c. LHC 2016, 2017 no intervention except diuresis needed. d. Low risk nuc 03/2016. e. Cath 12/2017 patent LIMA-LAD, SVG-OM, SVG-PDA but occluded SVG to diag.   . Depression    with anxious component  . GERD (gastroesophageal reflux disease)   . History of hiatal hernia   . Hyperlipidemia   . Hypothyroidism   . Obesity   . OSA on CPAP   . PMR (polymyalgia rheumatica) (HCC)   . Pneumonia    "2-3 times" (04/10/2016)  . Stable angina (HCC)    microvascular, improved with Ranexa  . Subclavian artery stenosis (HCC)    a. L by cath note in 2016.  .Marland KitchenTIA (transient ischemic attack)    Past Surgical History:  Procedure Laterality Date  . BREAST BIOPSY Right 1964  . CARDIAC CATHETERIZATION  08  patent grafts, no culprit lesions, EF 65%  . CARDIAC CATHETERIZATION N/A 12/19/2014   Procedure: Left Heart Cath and Cors/Grafts Angiography;  Surgeon: Jettie Booze, MD;  Location: [place] CV LAB;  Service: Cardiovascular;  Laterality: N/A;  . CARDIAC CATHETERIZATION N/A 12/19/2014   Procedure: Coronary Stent Intervention;  Surgeon: Jettie Booze, MD;  Location: [place] [facility];  Service: Cardiovascular;  Laterality: N/A;  . CARDIAC CATHETERIZATION N/A 01/01/2015    Procedure: Left Heart Cath and Cors/Grafts Angiography;  Surgeon: Jettie Booze, MD;  Location: [place] [facility] LAB;  Service: Cardiovascular;  Laterality: N/A;  . CARDIAC CATHETERIZATION N/A 09/20/2015   Procedure: Left Heart Cath and Cors/Grafts Angiography;  Surgeon: Jettie Booze, MD;  Location: [place] [facility];  Service: Cardiovascular;  Laterality: N/A;  . CATARACT EXTRACTION W/ INTRAOCULAR LENS  IMPLANT, BILATERAL Bilateral 2011  . COLONOSCOPY WITH PROPOFOL N/A 07/27/2016   Procedure: COLONOSCOPY WITH PROPOFOL;  Surgeon: Wilford Corner, MD;  Location: [place];  Service: Endoscopy;  Laterality: N/A;  . CORONARY ARTERY BYPASS GRAFT  2000   ASCVD, multivessel, S./P.  . DILATION AND CURETTAGE OF UTERUS  1980s  . ESOPHAGOGASTRODUODENOSCOPY (EGD) WITH PROPOFOL N/A 07/27/2016   Procedure: ESOPHAGOGASTRODUODENOSCOPY (EGD) WITH PROPOFOL;  Surgeon: Wilford Corner, MD;  Location: [place];  Service: Endoscopy;  Laterality: N/A;  . FRACTURE SURGERY    . LEFT HEART CATH AND CORS/GRAFTS ANGIOGRAPHY N/A 01/12/2018   Procedure: LEFT HEART CATH AND CORS/GRAFTS ANGIOGRAPHY;  Surgeon: Jettie Booze, MD;  Location: [place] [facility];  Service: Cardiovascular;  Laterality: N/A;  . PATELLA FRACTURE SURGERY Left 1993    Allergies  Allergen Reactions  . Nsaids Other (See Comments)    Due to chronic kidney failure  . Butorphanol Other (See Comments)    agitation Constipation Produced a lot of urine, made patient feel crazy  . Sulfa Antibiotics Rash  . Bisoprolol Fumarate Other (See Comments)    Doesn't remember  . Butorphanol Tartrate Other (See Comments)    Produced a lot of urine, made patient feel crazy  . Codeine Nausea And Vomiting  . Demerol [Meperidine] Nausea And Vomiting  . Imipramine     Sweating, facial dysfunction   . Meperidine And Related Nausea And Vomiting  . Statins     MYALGIAS  . Tequin [Gatifloxacin] Other (See Comments)    Caused  hypoglycemia Low blood sugar  . Brilinta [Ticagrelor] Rash    CAUSES PETECHIAE, PURPURA  . Pseudoephedrine Hcl Palpitations  . Septra [Sulfamethoxazole-Trimethoprim] Rash  . Sulfamethoxazole-Trimethoprim Rash    Allergies as of 05/17/2019      Reactions   Nsaids Other (See Comments)   Due to chronic kidney failure   Butorphanol Other (See Comments)   agitation Constipation Produced a lot of urine, made patient feel crazy   Sulfa Antibiotics Rash   Bisoprolol Fumarate Other (See Comments)   Doesn't remember   Butorphanol Tartrate Other (See Comments)   Produced a lot of urine, made patient feel crazy   Codeine Nausea And Vomiting   Demerol [meperidine] Nausea And Vomiting   Imipramine    Sweating, facial dysfunction    Meperidine And Related Nausea And Vomiting   Statins    MYALGIAS   Tequin [gatifloxacin] Other (See Comments)   Caused hypoglycemia Low blood sugar   Brilinta [ticagrelor] Rash   CAUSES PETECHIAE, PURPURA   Pseudoephedrine Hcl Palpitations   Septra [sulfamethoxazole-trimethoprim] Rash   Sulfamethoxazole-trimethoprim Rash      Medication List  Accurate as of May 17, 2019  3:18 PM. If you have any questions, ask your nurse or doctor.        alendronate 70 MG tablet Commonly known as: FOSAMAX Take 70 mg by mouth every Sunday.   alum & mag hydroxide-simeth 680-321-22 MG/5ML suspension Commonly known as: MAALOX PLUS Take 10 mLs by mouth as needed for indigestion.   atorvastatin 10 MG tablet Commonly known as: LIPITOR TAKE 1 TABLET ONCE DAILY.   Biofreeze 10 % Liqd Generic drug: Menthol (Topical Analgesic) Apply topically as needed.   calcium carbonate 600 MG Tabs tablet Commonly known as: OS-CAL Take 600 mg by mouth 2 (two) times daily with a meal.   clopidogrel 75 MG tablet Commonly known as: PLAVIX TAKE 1 TABLET ONCE DAILY.   COENZYME Q-10 PO Take 10 mg by mouth at bedtime.   fluticasone 50 MCG/ACT nasal spray Commonly  known as: FLONASE Place 2 sprays into both nostrils daily as needed for allergies.   Glucosamine 1500 Complex Caps Take 3 capsules by mouth daily after breakfast.   isosorbide mononitrate 60 MG 24 hr tablet Commonly known as: IMDUR Take 2 tablets (120 mg total) by mouth daily.   levocetirizine 5 MG tablet Commonly known as: XYZAL TAKE 1 TABLET ONCE DAILY IN THE EVENING.   levothyroxine 75 MCG tablet Commonly known as: SYNTHROID TAKE 1 TABLET ONCE DAILY ON EMPTY STOMACH.   Melatonin 1 MG Tabs Take by mouth at bedtime.   metoprolol succinate 50 MG 24 hr tablet Commonly known as: TOPROL-XL Take 1.5 tablets orally once a day. Take with or immediately following a meal. What changed: additional instructions   Myrbetriq 50 MG Tb24 tablet Generic drug: mirabegron ER TAKE 1 TABLET BY MOUTH DAILY.   nitroGLYCERIN 0.4 MG SL tablet Commonly known as: NITROSTAT Place 0.4 mg under the tongue every 5 (five) minutes as needed for chest pain. X 3 doses   nystatin powder Commonly known as: MYCOSTATIN/NYSTOP Apply 1 application topically. apply underneath abd/breast folds As Needed   pantoprazole 40 MG tablet Commonly known as: PROTONIX Take 1 tablet (40 mg total) by mouth daily.   predniSONE 5 MG tablet Commonly known as: DELTASONE Take 5 mg by mouth daily.   ranolazine 1000 MG SR tablet Commonly known as: RANEXA TAKE 1 TABLET BY MOUTH TWICE DAILY.   venlafaxine 75 MG tablet Commonly known as: EFFEXOR Take 150 mg by mouth. 2 caps= 150 mg; oral  Once A Day   Vitamin D 50 MCG (2000 UT) tablet Take 2,000 Units by mouth daily.   zinc oxide 20 % ointment Apply 1 application topically as needed for irritation.       Review of Systems  Constitutional: Positive for fatigue. Negative for activity change, appetite change, chills, diaphoresis, fever and unexpected weight change.  HENT: Positive for hearing loss. Negative for congestion and voice change.   Eyes: Negative for  visual disturbance.  Respiratory: Positive for shortness of breath. Negative for cough and wheezing.        DOE is chronic  Cardiovascular: Negative for chest pain and palpitations.  Gastrointestinal: Negative for abdominal distention, abdominal pain, constipation, diarrhea, nausea and vomiting.  Genitourinary: Negative for difficulty urinating and urgency.  Musculoskeletal: Positive for arthralgias, gait problem and myalgias.       The L thumb is in the splint due to recent fx.   Skin: Negative for color change.  Neurological: Negative for dizziness, speech difficulty, weakness and headaches.  Psychiatric/Behavioral: Negative for agitation, behavioral  problems, hallucinations and self-injury. The patient is not nervous/anxious.     Immunization History  Administered Date(s) Administered  . HPV Bivalent 11/24/2013, 01/17/2016  . Influenza Whole 12/23/2017  . Influenza, High Dose Seasonal PF 12/24/2018, 12/24/2018  . Influenza-Unspecified 01/10/2013, 12/27/2014, 12/30/2016  . Moderna SARS-COVID-2 Vaccination 03/25/2019, 04/22/2019  . Pneumococcal Conjugate-13 04/21/2013  . Pneumococcal Polysaccharide-23 02/27/1993, 03/23/2009  . Tdap 04/20/2012  . Zoster 11/10/2010   Pertinent  Health Maintenance Due  Topic Date Due  . INFLUENZA VACCINE  Completed  . DEXA SCAN  Completed  . PNA vac Low Risk Adult  Completed   Fall Risk  02/10/2019 10/06/2018 08/18/2018 01/27/2018 12/07/2017  Falls in the past year? 0 '1 1 1 '$ Yes  Number falls in past yr: 0 0 0 1 2 or more  Injury with Fall? - 0 1 0 No   Functional Status Survey:    Vitals:   05/17/19 0830  BP: 122/64  Pulse: 66  Resp: 18  Temp: (!) 97.1 F (36.2 C)  SpO2: 97%  Weight: 194 lb 3.2 oz (88.1 kg)  Height: '4\' 10"'$  (1.473 m)   Body mass index is 40.59 kg/m. Physical Exam Vitals and nursing note reviewed.  Constitutional:      General: She is not in acute distress.    Appearance: Normal appearance. She is not ill-appearing.   HENT:     Head: Normocephalic and atraumatic.     Nose: Nose normal.     Mouth/Throat:     Mouth: Mucous membranes are moist.  Eyes:     Extraocular Movements: Extraocular movements intact.     Conjunctiva/sclera: Conjunctivae normal.     Pupils: Pupils are equal, round, and reactive to light.  Cardiovascular:     Rate and Rhythm: Normal rate and regular rhythm.     Heart sounds: No gallop.   Pulmonary:     Breath sounds: No wheezing or rales.  Abdominal:     General: Bowel sounds are normal. There is no distension.     Palpations: Abdomen is soft.     Tenderness: There is no abdominal tenderness. There is no guarding or rebound.  Musculoskeletal:     Cervical back: Normal range of motion and neck supple.     Right lower leg: No edema.     Left lower leg: No edema.     Comments: Left thumb is in splint due to recent fx, no pain, tingling, or numbness in the left fingers  Skin:    General: Skin is warm and dry.     Comments: Right forearm skin tear, well approximated with steri strips, no s/s of infectin  Neurological:     General: No focal deficit present.     Mental Status: She is alert and oriented to person, place, and time. Mental status is at baseline.     Cranial Nerves: No cranial nerve deficit.     Motor: No weakness.     Coordination: Coordination normal.     Gait: Gait abnormal.  Psychiatric:        Mood and Affect: Mood normal.        Behavior: Behavior normal.        Thought Content: Thought content normal.        Judgment: Judgment normal.     Labs reviewed: Recent Labs    10/13/18 2040 10/13/18 2040 10/14/18 0229 10/14/18 0229 10/15/18 0226 10/20/18 0000 02/14/19 0000  NA 138   < > 140   < > 139  139 141  K 4.1   < > 4.2   < > 3.8 3.9 5.0  CL 106   < > 110   < > 107 106 107  CO2 21*   < > 19*   < > 22 23 25*  GLUCOSE 98  --  84  --  85  --   --   BUN 31*   < > 28*   < > 22 36* 22*  CREATININE 1.62*   < > 1.40*   < > 1.34* 1.3* 1.2*  CALCIUM  8.7*   < > 8.6*   < > 8.9 9.3 9.0   < > = values in this interval not displayed.   Recent Labs    06/07/18 0710 06/07/18 0710 07/17/18 2157 07/17/18 2157 07/26/18 0000 10/20/18 0000 02/14/19 0000  AST 10   < > 14*   < > 8* 10* 10*  ALT 7   < > 10   < > '7 8 7  '$ ALKPHOS  --   --  41   < > 45 44 41  BILITOT 0.3  --  0.3  --   --   --   --   PROT 6.0*  --  6.0*  --   --  5.9  --   ALBUMIN  --   --  3.4*  --   --  3.6 3.6   < > = values in this interval not displayed.   Recent Labs    07/17/18 2157 07/17/18 2157 07/18/18 0426 07/18/18 0426 07/19/18 0342 07/26/18 0000 10/13/18 2040 10/20/18 0000 02/14/19 0000  WBC 14.4*   < > 12.1*   < > 10.8*   < > 11.4* 11.7 7.8  NEUTROABS 12.7*  --   --   --   --   --  8.3*  --   --   HGB 10.6*   < > 10.2*   < > 10.6*   < > 11.3* 11.1* 10.2*  HCT 33.9*   < > 32.7*   < > 33.6*   < > 35.8* 34* 31*  MCV 99.1   < > 98.2  --  98.5  --  97.3  --   --   PLT 230   < > 224   < > 224   < > 253 277 254   < > = values in this interval not displayed.   Lab Results  Component Value Date   TSH 1.42 02/14/2019   Lab Results  Component Value Date   HGBA1C 5.1 02/14/2019   Lab Results  Component Value Date   CHOL 181 02/14/2019   HDL 54 02/14/2019   LDLCALC 99 02/14/2019   TRIG 185 (A) 02/14/2019   CHOLHDL 2.1 06/07/2018    Significant Diagnostic Results in last 30 days:  No results found.  Assessment/Plan CAD (coronary artery disease) Stable, continue Isosorbide, Plavix, prn NTG  Essential hypertension Blood pressure is controlled, continue Metoprolol.   Sleep apnea Continue CPAP  GERD (gastroesophageal reflux disease) Stable, continue Pantoprazole.   Hypothyroidism C/o fatigue, not new, continue Levothyroxine, update CBC/diff, CMP/eGFR, TSH  Osteoporosis No recent fx, continue Alendronate.   Hypertonicity of bladder Stable, continue Myrbetriq  Depression, recurrent ([place]) Her mood is stable, continue Venlafaxine.    Polymyalgia rheumatica (HCC) Stable, continue Prednisone     Family/ staff Communication: plan of care reviewed with the patient and charge nurse.   Labs/tests ordered:  CBC/diff, CMP/eGFR, TSH  Time spend 40  minutes.

## 2019-05-17 NOTE — Assessment & Plan Note (Signed)
Stable, continue Prednisone.  

## 2019-05-17 NOTE — Assessment & Plan Note (Signed)
Her mood is stable, continue Venlafaxine 

## 2019-05-17 NOTE — Assessment & Plan Note (Signed)
Continue CPAP.  

## 2019-05-17 NOTE — Assessment & Plan Note (Signed)
Blood pressure is controlled, continue Metoprolol. 

## 2019-05-17 NOTE — Assessment & Plan Note (Signed)
Stable, continue Pantoprazole.  

## 2019-05-17 NOTE — Assessment & Plan Note (Signed)
Stable, continue Isosorbide, Plavix, prn NTG

## 2019-05-17 NOTE — Assessment & Plan Note (Signed)
C/o fatigue, not new, continue Levothyroxine, update CBC/diff, CMP/eGFR, TSH

## 2019-05-18 LAB — BASIC METABOLIC PANEL
BUN: 28 — AB (ref 4–21)
CO2: 25 — AB (ref 13–22)
Chloride: 108 (ref 99–108)
Creatinine: 1.3 — AB (ref 0.5–1.1)
Glucose: 84
Potassium: 4.7 (ref 3.4–5.3)
Sodium: 142 (ref 137–147)

## 2019-05-18 LAB — COMPREHENSIVE METABOLIC PANEL
Albumin: 3.7 (ref 3.5–5.0)
Calcium: 9 (ref 8.7–10.7)
Globulin: 2

## 2019-05-18 LAB — CBC AND DIFFERENTIAL
HCT: 31 — AB (ref 36–46)
Hemoglobin: 10.1 — AB (ref 12.0–16.0)
Neutrophils Absolute: 5814
Platelets: 226 (ref 150–399)
WBC: 9

## 2019-05-18 LAB — HEPATIC FUNCTION PANEL
ALT: 5 — AB (ref 7–35)
AST: 7 — AB (ref 13–35)
Alkaline Phosphatase: 39 (ref 25–125)
Bilirubin, Total: 0.3

## 2019-05-18 LAB — TSH: TSH: 1.97 (ref 0.41–5.90)

## 2019-05-18 LAB — CBC: RBC: 3.27 — AB (ref 3.87–5.11)

## 2019-06-06 ENCOUNTER — Ambulatory Visit (INDEPENDENT_AMBULATORY_CARE_PROVIDER_SITE_OTHER): Payer: Medicare Other | Admitting: Neurology

## 2019-06-06 ENCOUNTER — Encounter: Payer: Self-pay | Admitting: Neurology

## 2019-06-06 ENCOUNTER — Other Ambulatory Visit: Payer: Self-pay

## 2019-06-06 VITALS — BP 107/70 | HR 59 | Temp 98.2°F | Ht <= 58 in | Wt 192.0 lb

## 2019-06-06 DIAGNOSIS — G4733 Obstructive sleep apnea (adult) (pediatric): Secondary | ICD-10-CM

## 2019-06-06 DIAGNOSIS — Z9989 Dependence on other enabling machines and devices: Secondary | ICD-10-CM | POA: Diagnosis not present

## 2019-06-06 NOTE — Patient Instructions (Signed)

## 2019-06-06 NOTE — Progress Notes (Signed)
SLEEP MEDICINE CLINIC    Provider:  Larey Seat, MD  Primary Care Physician:  Mast, Man X, NP [address] [place] [address]     Referring Provider: NP Man X Mast @ [facility]         Chief Complaint according to patient   Patient presents with:    . New Patient (Initial Visit)     rm 10. pt states that she had a SS >5 yrs, She feels like the pressure on the current machine is working as effectively. D[facility]      HISTORY OF PRESENT ILLNESS:  Erin Ingram is a 79  year old Caucasian female patient and friend of the late Erin Ingram, seen here as upon referral on 06/06/2019. She is an established patient with Dr Krista Blue, who she saw after a concussion. Last visit 18 month ago- she would be still the general neurologist if other questions than sleep arise.     I have the pleasure of seeing [name]   She   has a past medical history of Anemia, Arthritis, Chronic diastolic CHF (congestive heart failure) ([place]), CKD (chronic kidney disease), stage III, Coronary artery disease, Depression, GERD (gastroesophageal reflux disease), History of hiatal hernia, Hyperlipidemia, Hypothyroidism, Obesity, OSA on CPAP, PMR (polymyalgia rheumatica) ([place]), Pneumonia, Stable angina (HCC), Subclavian artery stenosis ([place]), and TIA (transient ischemic attack). She added concussion, gait disorder- uses a walker. Prone to falling.  She is using a CPAP for OSA treatment, originally treated by Dr. Moshe Salisbury. I have no access to the original sleep studies of which they were to it would be almost a decade ago.  Mrs. Portillo however has continued to use CPAP and is followed by [facility] as a medical equipment company.  Her current machine is a ResMed S 10, and it is an AutoSet.  So this machine has been much younger than her sleep studies.  She has a 93% compliance for the current machine with an average use at time of 8 hours and 17 minutes at night, the machine is a minimum pressure setting  of 4 maximum of 16 and full-time EPR of 3 cmH2O.  She does have a 95th percentile pressure of 9.7 cm which I would round up to 10 cm.  She does have mild air leak sometimes moderate.  Her residual AHI is 1.7 which is a good control and she does not show any central apneas to be arising.  Based on this I think it is just a question of mask fit to control the air leak.  The pressure settings do not need adjustment.  The machine was replaced through an [facility], Dr Maxwell Caul, about  [phone] . Through [facility].  Sleep relevant medical history: Nocturia/ 2-4 times.  OSA dx about 10-12 years ago.     Family medical /sleep history: a son on CPAP with OSA, obesity related.    Social history:  Patient is retired from Cytogeneticist and lives in a household with spouse at friends home.  Family status is married , with adult children.  Pets are not present. Tobacco use mild and remote- quit 1971.  ETOH use - seldomly  Caffeine intake in form of Coffee( decaff) Soda( none) Tea ( rare ) or energy drinks. was a frequent tai- chi participant.   Sleep habits are as follows:  The patient's dinner time is between 4.45 PM. The patient goes to bed at 10.30PM and continues to sleep for 3-4  hours, wakes for 3 bathroom breaks, the first time at 2 AM.   The preferred sleep position is variable , with the support of 1-2 pillows. Dreams are reportedly rare/ frequent/vivid.  8.30  AM is the usual rise time.  The patient wakes up with an alarm.   She reports not feeling refreshed or restored in AM, with symptoms such as dry mouth, and residual fatigue. Naps are taken frequently, lasting from 30-60 minutes and areless refreshing than nocturnal sleep.    Review of Systems: Out of a complete 14 system review, the patient complains of only the following symptoms, and all other reviewed systems are negative.:  Fatigue, sleepiness , snoring, fragmented sleep, Insomnia - cyclic   How likely are you to doze in the following  situations: 0 = not likely, 1 = slight chance, 2 = moderate chance, 3 = high chance   Sitting and Reading? Watching Television? Sitting inactive in a public place (theater or meeting)? As a passenger in a car for an hour without a break? Lying down in the afternoon when circumstances permit? Sitting and talking to someone? Sitting quietly after lunch without alcohol? In a car, while stopped for a few minutes in traffic?   Total = 12/ 24 points   FSS endorsed at 63/ 63 points.   Social History   Socioeconomic History  . Marital status: Married    Spouse name: Not on file  . Number of children: 2  . Years of education: College  . Highest education level: Not on file  Occupational History  . Occupation: Retired Cytogeneticist  Tobacco Use  . Smoking status: Former Smoker    Packs/day: 0.50    Years: 7.00    Pack years: 3.50    Types: Cigarettes    Quit date: 08/21/1969    Years since quitting: 49.8  . Smokeless tobacco: Never Used  Substance and Sexual Activity  . Alcohol use: No    Alcohol/week: 0.0 standard drinks    Comment: rare  . Drug use: No  . Sexual activity: Never  Other Topics Concern  . Not on file  Social History Narrative   Social History     Social History Narrative       Lives in [place] with husband.   Diet: Regular   Do you drink/eat things with caffeine? 1 cup caffeine per day.  Not much coffee   Marital status: Married                           What year were you married? 1964   Do you live in a house, apartment, assisted living, condo, trailer, etc)? Here at [facility]   Is it one or more stories?   How many persons live in your home? 2   Do you have any pets in your home? No   Current or past profession: Engineer, production (RN)   Do you exercise? I did                                                Type & how often: Swimming, Tai Chi, exercise classes   Do you have a living will? yes   Do you have a DNR Form? Yes   Do you have a POA/HPOA  forms?  Social Determinants of Health   Financial Resource Strain:   . Difficulty of Paying Living Expenses:   Food Insecurity:   . Worried About Charity fundraiser in the Last Year:   . Arboriculturist in the Last Year:   Transportation Needs:   . Film/video editor (Medical):   Marland Kitchen Lack of Transportation (Non-Medical):   Physical Activity:   . Days of Exercise per Week:   . Minutes of Exercise per Session:   Stress:   . Feeling of Stress :   Social Connections:   . Frequency of Communication with Friends and Family:   . Frequency of Social Gatherings with Friends and Family:   . Attends Religious Services:   . Active Member of Clubs or Organizations:   . Attends Archivist Meetings:   Marland Kitchen Marital Status:     Family History  Problem Relation Age of Onset  . Heart disease Mother   . Heart attack Mother   . Heart disease Father   . Heart attack Father   . Diabetes Brother   . Pulmonary embolism Brother   . Stroke Paternal Grandmother   . Hypertension Neg Hx     Past Medical History:  Diagnosis Date  . Anemia   . Arthritis    "fingers, back, shoulders, hips" (04/10/2016)  . Chronic diastolic CHF (congestive heart failure) (HCC)    a. normal EF, LVEDP at [facility] in 6/17 33 >> Lasix started   . CKD (chronic kidney disease), stage III   . Coronary artery disease    a. s/p CABG 2000 (SVG/ free LIMA Y graft to the diagonal and distal LAD, SVG to the OM 1, and SVG to the PDA) . b. Cath 12/19/2014 90% dSVG to RCA s/p 2 overlapping DES. c. LHC 2016, 2017 no intervention except diuresis needed. d. Low risk nuc 03/2016. e. Cath 12/2017 patent LIMA-LAD, SVG-OM, SVG-PDA but occluded SVG to diag.   . Depression    with anxious component  . GERD (gastroesophageal reflux disease)   . History of hiatal hernia   . Hyperlipidemia   . Hypothyroidism   . Obesity   . OSA on CPAP   . PMR (polymyalgia rheumatica) (HCC)   . Pneumonia    "2-3 times" (04/10/2016)  .  Stable angina (HCC)    microvascular, improved with Ranexa  . Subclavian artery stenosis (HCC)    a. L by cath note in 2016.  Marland Kitchen TIA (transient ischemic attack)     Past Surgical History:  Procedure Laterality Date  . BREAST BIOPSY Right 1964  . CARDIAC CATHETERIZATION  08   patent grafts, no culprit lesions, EF 65%  . CARDIAC CATHETERIZATION N/A 12/19/2014   Procedure: Left Heart Cath and Cors/Grafts Angiography;  Surgeon: Jettie Booze, MD;  Location: [facility];  Service: Cardiovascular;  Laterality: N/A;  . CARDIAC CATHETERIZATION N/A 12/19/2014   Procedure: Coronary Stent Intervention;  Surgeon: Jettie Booze, MD;  Location: [facility];  Service: Cardiovascular;  Laterality: N/A;  . CARDIAC CATHETERIZATION N/A 01/01/2015   Procedure: Left Heart Cath and Cors/Grafts Angiography;  Surgeon: Jettie Booze, MD;  Location: [facility];  Service: Cardiovascular;  Laterality: N/A;  . CARDIAC CATHETERIZATION N/A 09/20/2015   Procedure: Left Heart Cath and Cors/Grafts Angiography;  Surgeon: Jettie Booze, MD;  Location: [facility];  Service: Cardiovascular;  Laterality: N/A;  . CATARACT EXTRACTION W/ INTRAOCULAR LENS  IMPLANT, BILATERAL Bilateral 2011  .  COLONOSCOPY WITH PROPOFOL N/A 07/27/2016   Procedure: COLONOSCOPY WITH PROPOFOL;  Surgeon: Wilford Corner, MD;  Location: [facility];  Service: Endoscopy;  Laterality: N/A;  . CORONARY ARTERY BYPASS GRAFT  2000   ASCVD, multivessel, S./P.  . DILATION AND CURETTAGE OF UTERUS  1980s  . ESOPHAGOGASTRODUODENOSCOPY (EGD) WITH PROPOFOL N/A 07/27/2016   Procedure: ESOPHAGOGASTRODUODENOSCOPY (EGD) WITH PROPOFOL;  Surgeon: Wilford Corner, MD;  Location: [facility];  Service: Endoscopy;  Laterality: N/A;  . FRACTURE SURGERY    . LEFT HEART CATH AND CORS/GRAFTS ANGIOGRAPHY N/A 01/12/2018   Procedure: LEFT HEART CATH AND CORS/GRAFTS ANGIOGRAPHY;  Surgeon: Jettie Booze, MD;  Location: [facility];  Service: Cardiovascular;  Laterality: N/A;  . PATELLA FRACTURE SURGERY Left 1993     Current Outpatient Medications on File Prior to Visit  Medication Sig Dispense Refill  . alendronate (FOSAMAX) 70 MG tablet Take 70 mg by mouth every Sunday.     Marland Kitchen alum & mag hydroxide-simeth (MAALOX PLUS) 400-400-40 MG/5ML suspension Take 10 mLs by mouth as needed for indigestion.    Marland Kitchen atorvastatin (LIPITOR) 10 MG tablet TAKE 1 TABLET ONCE DAILY. 90 tablet 2  . calcium carbonate (OS-CAL) 600 MG TABS tablet Take 600 mg by mouth 2 (two) times daily with a meal.    . Cholecalciferol (VITAMIN D) 50 MCG (2000 UT) tablet Take 2,000 Units by mouth daily.    . clopidogrel (PLAVIX) 75 MG tablet TAKE 1 TABLET ONCE DAILY. 90 tablet 3  . COENZYME Q-10 PO Take 10 mg by mouth at bedtime.    . fluticasone (FLONASE) 50 MCG/ACT nasal spray Place 2 sprays into both nostrils daily as needed for allergies.     . Glucosamine-Chondroit-Vit C-Mn (GLUCOSAMINE 1500 COMPLEX) CAPS Take 3 capsules by mouth daily after breakfast.     . isosorbide mononitrate (IMDUR) 60 MG 24 hr tablet Take 2 tablets (120 mg total) by mouth daily. 180 tablet 3  . levocetirizine (XYZAL) 5 MG tablet TAKE 1 TABLET ONCE DAILY IN THE EVENING. 90 tablet 1  . levothyroxine (SYNTHROID) 75 MCG tablet TAKE 1 TABLET ONCE DAILY ON EMPTY STOMACH. 90 tablet 1  . Melatonin 1 MG TABS Take by mouth at bedtime.    . Menthol, Topical Analgesic, (BIOFREEZE) 10 % LIQD Apply topically as needed.    . metoprolol succinate (TOPROL-XL) 50 MG 24 hr tablet Take 1.5 tablets orally once a day. Take with or immediately following a meal. (Patient taking differently: Take 1 tablet orally twice a day. Take with or immediately following a meal.) 135 tablet 3  . MYRBETRIQ 50 MG TB24 tablet TAKE 1 TABLET BY MOUTH DAILY. 90 tablet 0  . nitroGLYCERIN (NITROSTAT) 0.4 MG SL tablet Place 0.4 mg under the tongue every 5 (five) minutes as needed for chest pain. X 3 doses    .  nystatin (MYCOSTATIN/NYSTOP) powder Apply 1 application topically. apply underneath abd/breast folds As Needed    . pantoprazole (PROTONIX) 40 MG tablet Take 1 tablet (40 mg total) by mouth daily. 90 tablet 3  . predniSONE (DELTASONE) 5 MG tablet Take 5 mg by mouth daily.     . ranolazine (RANEXA) 1000 MG SR tablet TAKE 1 TABLET BY MOUTH TWICE DAILY. 180 tablet 2  . venlafaxine (EFFEXOR) 75 MG tablet Take 150 mg by mouth. 2 caps= 150 mg; oral  Once A Day    . zinc oxide 20 % ointment Apply 1 application topically as needed for irritation.     No current  facility-administered medications on file prior to visit.    Allergies  Allergen Reactions  . Nsaids Other (See Comments)    Due to chronic kidney failure  . Butorphanol Other (See Comments)    agitation Constipation Produced a lot of urine, made patient feel crazy  . Sulfa Antibiotics Rash  . Bisoprolol Fumarate Other (See Comments)    Doesn't remember  . Butorphanol Tartrate Other (See Comments)    Produced a lot of urine, made patient feel crazy  . Codeine Nausea And Vomiting  . Demerol [Meperidine] Nausea And Vomiting  . Imipramine     Sweating, facial dysfunction   . Meperidine And Related Nausea And Vomiting  . Statins     MYALGIAS  . Tequin [Gatifloxacin] Other (See Comments)    Caused hypoglycemia Low blood sugar  . Brilinta [Ticagrelor] Rash    CAUSES PETECHIAE, PURPURA  . Pseudoephedrine Hcl Palpitations  . Septra [Sulfamethoxazole-Trimethoprim] Rash  . Sulfamethoxazole-Trimethoprim Rash    Physical exam:  Today's Vitals   06/06/19 1252  BP: 107/70  Pulse: (!) 59  Temp: 98.2 F (36.8 C)  Weight: 192 lb (87.1 kg)  Height: '4\' 10"'$  (1.473 m)   Body mass index is 40.13 kg/m.   Wt Readings from Last 3 Encounters:  06/06/19 192 lb (87.1 kg)  05/17/19 194 lb 3.2 oz (88.1 kg)  05/01/19 194 lb 3.2 oz (88.1 kg)     Ht Readings from Last 3 Encounters:  06/06/19 '4\' 10"'$  (1.473 m)  05/17/19 '4\' 10"'$  (1.473 m)    05/01/19 '4\' 10"'$  (1.473 m)      General: The patient is awake, alert and appears not in acute distress. The patient is well groomed. Head: Normocephalic, atraumatic. Neck is supple. Mallampati 1 ,  neck circumference: 15 inches . Nasal airflow  patent.  Retrognathia is not  seen.  Dental status: dental implants.   Cardiovascular:  Regular rate and cardiac rhythm by pulse,  without distended neck veins. Respiratory: Lungs are clear to auscultation.  Skin:  Without evidence of ankle edema, or rash. Trunk: The patient's posture is stooped    Neurologic exam : The patient is awake and alert, oriented to place and time.   Memory subjective described as impaired - words, names and dates.  Attention span & concentration ability appears normal.  Speech is fluent,  without  dysarthria, with mild dysphonia and certainly with aphasia.  Mood and affect are appropriate.   Cranial nerves: no loss of smell or taste reported  Pupils are equal and briskly reactive to light. Funduscopic exam deferred.  Extraocular movements in vertical and horizontal planes were intact and without nystagmus. No Diplopia. Visual fields by finger perimetry are intact. Hearing was intact to soft voice and finger rubbing. (!)    Facial sensation intact to fine touch.  Facial motor strength is symmetric and tongue and uvula move midline.  Neck ROM : rotation, tilt and flexion extension were normal for age and shoulder shrug was symmetrical.    Motor exam:  Symmetric bulk, tone and ROM.   Normal tone without cog-wheeling, symmetric grip strength.   Sensory:  Fine touch, pinprick and vibration were normal.  Proprioception tested in the upper extremities was normal.   Coordination: Rapid alternating movements in the fingers/hands were of normal speed.  The Finger-to-nose maneuver was intact without evidence of ataxia, but mild dysmetria and no tremor.   Gait and station: Patient could not rise unassisted from a seated  position, walked with a walker as  assistive device.  Toe and heel walk were deferred.  Deep tendon reflexes: in the  upper and lower extremities are symmetric and intact.  Babinski response was deferred.     the patient has been diagnosed with primary insomnia , syncope and had various ED visits.  I reviewed her medication, med history, but there are difficulties with her memory as well as her long standing gait disorder. She has been seen by Dr Krista Blue less than 3 years ago.         After spending a total time of  50  minutes face to face and time for physical and neurologic examination, review of laboratory studies,  personal review of imaging studies, reports and results of other testing and review of referral information / records as far as provided in visit, I have established the following assessments:  1) OSA in a patient with known coronary artery disease subclavian stenosis anemia, multiple emergency room visits.  Including for unstable angina, transient cerebral ischemia, heart failure and the patient was seen by Dr. Krista Blue from November 2017 on.  Her apnea is well controlled but I do not know her baseline.    The current AHI is 1.7 and a lower than 3/h AHI is considered very well controlled.  She does not have central apneas emerging, she does not have major air leaks, she just needs new supplies her settings fulfill her current need for pressure and she has been highly compliant.  She endorsed more fatigue than actual sleepiness.  There has also been certainly a lack of stimulation and the Covid pandemic has have led to a lot of isolation.  I reviewed recent laboratory tests which were all from 2020 she had an elevated BUN in July 2023 at 31, creatinine was 1.62 it seems to me that this may have been dehydration related more than anything.  Hemoglobin was 10.6 and hematocrit 33.9 on July 19, 2018, she reports that her GERD has been well controlled hypothyroidism is well controlled.  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  And she does not have a lot of chest pain or discomfort.    My role will be to make sure that she has refills for her established CPAP, prescribed by another physician?    My Plan is to proceed with:  1) new mask - replacement with headgear for [employer] ZEST nasal mask.    I would like to thank Mast, Man X, NP and Mast, Man X, Np [address],  [place] [address] for allowing me to meet with and to take care of this pleasant patient.   In short, Erin Ingram is presenting with need for CPAP supplies but excellent control of apnea on CPAP prescribed in 2018 by Dr Maxwell Caul.    I plan to follow up through our NP within 12 month.   CC : Dr Krista Blue, Man Otho Darner, NP   Electronically signed by: Larey Seat, MD 06/06/2019 1:11 PM  [facility] Board certified by The [employer] of Sleep Medicine and Diplomate of the [employer] of Sleep Medicine. Board certified In Neurology through the [place], Fellow of the [employer] of Neurology. Medical Director of [facility].

## 2019-07-27 ENCOUNTER — Encounter: Payer: Self-pay | Admitting: Internal Medicine

## 2019-07-27 ENCOUNTER — Non-Acute Institutional Stay: Payer: Medicare Other | Admitting: Internal Medicine

## 2019-07-27 DIAGNOSIS — I10 Essential (primary) hypertension: Secondary | ICD-10-CM | POA: Diagnosis not present

## 2019-07-27 DIAGNOSIS — E039 Hypothyroidism, unspecified: Secondary | ICD-10-CM | POA: Diagnosis not present

## 2019-07-27 DIAGNOSIS — N1831 Chronic kidney disease, stage 3a: Secondary | ICD-10-CM

## 2019-07-27 DIAGNOSIS — G4733 Obstructive sleep apnea (adult) (pediatric): Secondary | ICD-10-CM | POA: Diagnosis not present

## 2019-07-27 DIAGNOSIS — E785 Hyperlipidemia, unspecified: Secondary | ICD-10-CM

## 2019-07-27 DIAGNOSIS — I2510 Atherosclerotic heart disease of native coronary artery without angina pectoris: Secondary | ICD-10-CM

## 2019-07-27 DIAGNOSIS — D649 Anemia, unspecified: Secondary | ICD-10-CM

## 2019-07-27 DIAGNOSIS — M8000XA Age-related osteoporosis with current pathological fracture, unspecified site, initial encounter for fracture: Secondary | ICD-10-CM

## 2019-07-27 DIAGNOSIS — W19XXXA Unspecified fall, initial encounter: Secondary | ICD-10-CM

## 2019-07-27 DIAGNOSIS — M353 Polymyalgia rheumatica: Secondary | ICD-10-CM

## 2019-07-27 NOTE — Progress Notes (Signed)
Location:   [facility] Room Number: [place]:  ALF [phone]) Provider:  Veleta Miners MD  Mast, Man X, NP  Patient Care Team: Mast, Man X, NP as PCP - General (Internal Medicine) Jettie Booze, MD as PCP - Cardiology (Cardiology) Wilford Corner, MD as Consulting Physician (Gastroenterology) Marlou Sa, Tonna Corner, MD as Consulting Physician (Orthopedic Surgery) Marcial Pacas, MD as Consulting Physician (Neurology) Franchot Gallo, MD as Consulting Physician (Urology) Kary Kos, MD as Consulting Physician (Neurosurgery)  Extended Emergency Contact Information Primary Emergency Contact: Merlinda Frederick Address: [address]           [address] [place] Phone: [phone] Mobile Phone: [phone] Relation: Spouse Secondary Emergency Contact: [place] Mobile Phone: [phone] Relation: Son  Code Status:  DNR Goals of care: Advanced Directive information Advanced Directives 07/27/2019  Does Patient Have a Medical Advance Directive? Yes  Type of Advance Directive Out of facility DNR (pink MOST or yellow form)  Does patient want to make changes to medical advance directive? No - Patient declined  Copy of Orlinda in Chart? -  Would patient like information on creating a medical advance directive? -  Pre-existing out of facility DNR order (yellow form or pink MOST form) Yellow form placed in chart (order not valid for inpatient use);Pink MOST form placed in chart (order not valid for inpatient use)     Chief Complaint  Patient presents with  . Medical Management of Chronic Issues    HPI:  Pt is a 79 y.o. female seen today for medical management of chronic diseases.    Patient has h/o Obesity with OSA, Chronic Diastolic CHF, CAD s/p [id number] on chronic Steroids and Hypothyroidism,chronic diastolic CHF and hyperlipidemia.  PMR on Chronic  Prednisone. Follows with Rheumatology Doing well. No Pain or Weakness CAD Follows with Cardiology Stress test in 7/20 was negative. No active symptoms Diarrhea alternate with Constipation Has diagnosis of IBS Wants to know if she can try Psyllium Husk Depression Stable Falls Patient had another fall yesterday. She denies any Dizziness or Syncope. Looks like Therapist, music Fall. She had lump in her Head and some skin tears. She is in with her husband in her Room and they have lot of stuff which increase her Fall risk. Is working with therapy.  Now walks with rolling walker   Past Medical History:  Diagnosis Date  . Anemia   . Arthritis    "fingers, back, shoulders, hips" (04/10/2016)  . Chronic diastolic CHF (congestive heart failure) (HCC)    a. normal EF, LVEDP at [facility] in 6/17 33 >> Lasix started   . CKD (chronic kidney disease), stage III   . Coronary artery disease    a. s/p CABG 2000 (SVG/ free LIMA Y graft to the diagonal and distal LAD, SVG to the OM 1, and SVG to the PDA) . b. Cath 12/19/2014 90% dSVG to RCA s/p 2 overlapping DES. c. LHC 2016, 2017 no intervention except diuresis needed. d. Low risk nuc 03/2016. e. Cath 12/2017 patent LIMA-LAD, SVG-OM, SVG-PDA but occluded SVG to diag.   . Depression    with anxious component  . GERD (gastroesophageal reflux disease)   . History of hiatal hernia   . Hyperlipidemia   . Hypothyroidism   . Obesity   . OSA on CPAP   . PMR (polymyalgia rheumatica) (HCC)   . Pneumonia    "2-3 times" (04/10/2016)  . Stable angina (HCC)  microvascular, improved with Ranexa  . Subclavian artery stenosis (HCC)    a. L by cath note in 2016.  Marland Kitchen TIA (transient ischemic attack)    Past Surgical History:  Procedure Laterality Date  . BREAST BIOPSY Right 1964  . CARDIAC CATHETERIZATION  08   patent grafts, no culprit lesions, EF 65%  . CARDIAC CATHETERIZATION N/A 12/19/2014   Procedure: Left Heart Cath and Cors/Grafts Angiography;  Surgeon: Jettie Booze, MD;  Location: [facility];  Service: Cardiovascular;  Laterality: N/A;  . CARDIAC CATHETERIZATION N/A 12/19/2014   Procedure: Coronary Stent Intervention;  Surgeon: Jettie Booze, MD;  Location: [facility];  Service: Cardiovascular;  Laterality: N/A;  . CARDIAC CATHETERIZATION N/A 01/01/2015   Procedure: Left Heart Cath and Cors/Grafts Angiography;  Surgeon: Jettie Booze, MD;  Location: [facility];  Service: Cardiovascular;  Laterality: N/A;  . CARDIAC CATHETERIZATION N/A 09/20/2015   Procedure: Left Heart Cath and Cors/Grafts Angiography;  Surgeon: Jettie Booze, MD;  Location: [facility];  Service: Cardiovascular;  Laterality: N/A;  . CATARACT EXTRACTION W/ INTRAOCULAR LENS  IMPLANT, BILATERAL Bilateral 2011  . COLONOSCOPY WITH PROPOFOL N/A 07/27/2016   Procedure: COLONOSCOPY WITH PROPOFOL;  Surgeon: Wilford Corner, MD;  Location: [facility];  Service: Endoscopy;  Laterality: N/A;  . CORONARY ARTERY BYPASS GRAFT  2000   ASCVD, multivessel, S./P.  . DILATION AND CURETTAGE OF UTERUS  1980s  . ESOPHAGOGASTRODUODENOSCOPY (EGD) WITH PROPOFOL N/A 07/27/2016   Procedure: ESOPHAGOGASTRODUODENOSCOPY (EGD) WITH PROPOFOL;  Surgeon: Wilford Corner, MD;  Location: [facility];  Service: Endoscopy;  Laterality: N/A;  . FRACTURE SURGERY    . LEFT HEART CATH AND CORS/GRAFTS ANGIOGRAPHY N/A 01/12/2018   Procedure: LEFT HEART CATH AND CORS/GRAFTS ANGIOGRAPHY;  Surgeon: Jettie Booze, MD;  Location: [facility];  Service: Cardiovascular;  Laterality: N/A;  . PATELLA FRACTURE SURGERY Left 1993    Allergies  Allergen Reactions  . Nsaids Other (See Comments)    Due to chronic kidney failure  . Butorphanol Other (See Comments)    agitation Constipation Produced a lot of urine, made patient feel crazy  . Sulfa Antibiotics Rash  . Bisoprolol Fumarate Other (See Comments)    Doesn't remember  . Butorphanol Tartrate Other (See  Comments)    Produced a lot of urine, made patient feel crazy  . Codeine Nausea And Vomiting  . Demerol [Meperidine] Nausea And Vomiting  . Imipramine     Sweating, facial dysfunction   . Meperidine And Related Nausea And Vomiting  . Statins     MYALGIAS  . Tequin [Gatifloxacin] Other (See Comments)    Caused hypoglycemia Low blood sugar  . Brilinta [Ticagrelor] Rash    CAUSES PETECHIAE, PURPURA  . Pseudoephedrine Hcl Palpitations  . Septra [Sulfamethoxazole-Trimethoprim] Rash  . Sulfamethoxazole-Trimethoprim Rash    Allergies as of 07/27/2019      Reactions   Nsaids Other (See Comments)   Due to chronic kidney failure   Butorphanol Other (See Comments)   agitation Constipation Produced a lot of urine, made patient feel crazy   Sulfa Antibiotics Rash   Bisoprolol Fumarate Other (See Comments)   Doesn't remember   Butorphanol Tartrate Other (See Comments)   Produced a lot of urine, made patient feel crazy   Codeine Nausea And Vomiting   Demerol [meperidine] Nausea And Vomiting   Imipramine    Sweating, facial dysfunction    Meperidine And Related Nausea And Vomiting   Statins  MYALGIAS   Tequin [gatifloxacin] Other (See Comments)   Caused hypoglycemia Low blood sugar   Brilinta [ticagrelor] Rash   CAUSES PETECHIAE, PURPURA   Pseudoephedrine Hcl Palpitations   Septra [sulfamethoxazole-trimethoprim] Rash   Sulfamethoxazole-trimethoprim Rash      Medication List       Accurate as of Jul 27, 2019 11:00 AM. If you have any questions, ask your nurse or doctor.        acetaminophen 325 MG tablet Commonly known as: TYLENOL Take 650 mg by mouth in the morning and at bedtime.   alendronate 70 MG tablet Commonly known as: FOSAMAX Take 70 mg by mouth every Sunday.   alum & mag hydroxide-simeth [id number] MG/5ML suspension Commonly known as: MAALOX PLUS Take 10 mLs by mouth as needed for indigestion.   atorvastatin 10 MG tablet Commonly known as: LIPITOR TAKE 1  TABLET ONCE DAILY.   Biofreeze 10 % Liqd Generic drug: Menthol (Topical Analgesic) Apply topically as needed.   calcium carbonate 600 MG Tabs tablet Commonly known as: OS-CAL Take 600 mg by mouth 2 (two) times daily with a meal.   clopidogrel 75 MG tablet Commonly known as: PLAVIX TAKE 1 TABLET ONCE DAILY.   COENZYME Q-10 PO Take 10 mg by mouth at bedtime.   fluticasone 50 MCG/ACT nasal spray Commonly known as: FLONASE Place 2 sprays into both nostrils daily as needed for allergies.   Glucosamine 1500 Complex Caps Take 3 capsules by mouth daily after breakfast.   isosorbide mononitrate 60 MG 24 hr tablet Commonly known as: IMDUR Take 2 tablets (120 mg total) by mouth daily.   levocetirizine 5 MG tablet Commonly known as: XYZAL TAKE 1 TABLET ONCE DAILY IN THE EVENING.   levothyroxine 75 MCG tablet Commonly known as: SYNTHROID TAKE 1 TABLET ONCE DAILY ON EMPTY STOMACH.   melatonin 1 MG Tabs tablet Take by mouth at bedtime.   metoprolol succinate 50 MG 24 hr tablet Commonly known as: TOPROL-XL Take 50 mg by mouth in the morning and at bedtime. Take with or immediately following a meal. What changed: Another medication with the same name was removed. Continue taking this medication, and follow the directions you see here. Changed by: Virgie Dad, MD   Myrbetriq 50 MG Tb24 tablet Generic drug: mirabegron ER TAKE 1 TABLET BY MOUTH DAILY.   nitroGLYCERIN 0.4 MG SL tablet Commonly known as: NITROSTAT Place 0.4 mg under the tongue every 5 (five) minutes as needed for chest pain. X 3 doses   nystatin powder Commonly known as: MYCOSTATIN/NYSTOP Apply 1 application topically. apply underneath abd/breast folds As Needed   pantoprazole 40 MG tablet Commonly known as: PROTONIX Take 1 tablet (40 mg total) by mouth daily.   predniSONE 5 MG tablet Commonly known as: DELTASONE Take 5 mg by mouth daily.   ranolazine 1000 MG SR tablet Commonly known as: RANEXA TAKE 1  TABLET BY MOUTH TWICE DAILY.   venlafaxine 75 MG tablet Commonly known as: EFFEXOR Take 150 mg by mouth. 2 caps= 150 mg; oral  Once A Day   Vitamin D 50 MCG (2000 UT) tablet Take 2,000 Units by mouth daily.   zinc oxide 20 % ointment Apply 1 application topically as needed for irritation.       Review of Systems  Constitutional: Positive for activity change and appetite change.  HENT: Negative.   Respiratory: Negative.   Cardiovascular: Negative.   Gastrointestinal: Positive for constipation and diarrhea.  Genitourinary: Positive for frequency and urgency.  Musculoskeletal:  Positive for gait problem.  Skin: Negative.   Neurological: Positive for dizziness.  Psychiatric/Behavioral: Negative.   All other systems reviewed and are negative.   Immunization History  Administered Date(s) Administered  . HPV Bivalent 11/24/2013, 01/17/2016  . Influenza Whole 12/23/2017  . Influenza, High Dose Seasonal PF 12/24/2018, 12/24/2018  . Influenza-Unspecified 01/10/2013, 12/27/2014, 12/30/2016  . Moderna SARS-COVID-2 Vaccination 03/25/2019, 04/22/2019  . Pneumococcal Conjugate-13 04/21/2013  . Pneumococcal Polysaccharide-23 02/27/1993, 03/23/2009  . Tdap 04/20/2012  . Zoster 11/10/2010   Pertinent  Health Maintenance Due  Topic Date Due  . INFLUENZA VACCINE  10/22/2019  . DEXA SCAN  Completed  . PNA vac Low Risk Adult  Completed   Fall Risk  02/10/2019 10/06/2018 08/18/2018 01/27/2018 12/07/2017  Falls in the past year? 0 1 1 1  Yes  Number falls in past yr: 0 0 0 1 2 or more  Injury with Fall? - 0 1 0 No   Functional Status Survey:    Vitals:   07/27/19 1047  BP: 128/68  Pulse: 68  Resp: 18  Temp: 97.9 F (36.6 C)  SpO2: 95%  Weight: 188 lb (85.3 kg)  Height: 4\' 10"  (1.473 m)   Body mass index is 39.29 kg/m. Physical Exam Constitutional: Oriented to person, place, and time. Well-developed and well-nourished.  HENT:  Head: Normocephalic.  Mouth/Throat: Oropharynx  is clear and moist.  Eyes: Pupils are equal, round, and reactive to light.  Neck: Neck supple.  Cardiovascular: Normal rate and normal heart sounds.  Murmur Present Pulmonary/Chest: Effort normal and breath sounds normal. No respiratory distress. No wheezes. She has no rales.  Abdominal: Soft. Bowel sounds are normal. No distension. There is no tenderness. There is no rebound.  Musculoskeletal: No edema.  Lymphadenopathy: none Neurological: Alert and oriented to person, place, and time.  Had Small Lump in her head No Focal deficit Was not dizzy on standing position Skin: Skin is warm and dry. Has Few Skin tears due to fall last night Psychiatric: Normal mood and affect. Behavior is normal. Thought content normal.   Labs reviewed: Recent Labs    10/13/18 2040 10/13/18 2040 10/14/18 0229 10/14/18 0229 10/15/18 0226 10/15/18 0226 10/20/18 0000 02/14/19 0000 05/18/19 0000  NA 138   < > 140   < > 139  --  139 141 142  K 4.1   < > 4.2   < > 3.8   < > 3.9 5.0 4.7  CL 106   < > 110   < > 107  --  106 107 108  CO2 21*   < > 19*   < > 22  --  23 25* 25*  GLUCOSE 98  --  84  --  85  --   --   --   --   BUN 31*   < > 28*   < > 22  --  36* 22* 28*  CREATININE 1.62*   < > 1.40*   < > 1.34*  --  1.3* 1.2* 1.3*  CALCIUM 8.7*   < > 8.6*   < > 8.9  --  9.3 9.0 9.0   < > = values in this interval not displayed.   Recent Labs    10/20/18 0000 02/14/19 0000 05/18/19 0000  AST 10* 10* 7*  ALT 8 7 5*  ALKPHOS 44 41 39  PROT 5.9  --   --   ALBUMIN 3.6 3.6 3.7   Recent Labs    10/13/18 2040 10/13/18 2040  10/20/18 0000 02/14/19 0000 05/18/19 0000  WBC 11.4*  --  11.7 7.8 9.0  NEUTROABS 8.3*  --   --   --  5,814  HGB 11.3*   < > 11.1* 10.2* 10.1*  HCT 35.8*   < > 34* 31* 31*  MCV 97.3  --   --   --   --   PLT 253   < > 277 254 226   < > = values in this interval not displayed.   Lab Results  Component Value Date   TSH 1.97 05/18/2019   Lab Results  Component Value Date    HGBA1C 5.1 02/14/2019   Lab Results  Component Value Date   CHOL 181 02/14/2019   HDL 54 02/14/2019   LDLCALC 99 02/14/2019   TRIG 185 (A) 02/14/2019   CHOLHDL 2.1 06/07/2018    Significant Diagnostic Results in last 30 days:  No results found.  Assessment/Plan Coronary artery disease involving native coronary artery of native heart without angina pectoris Asymptomatic Follows with Cardiology LDL good levels. on beta blocker and Plavix  Essential hypertension Controlled  Obstructive sleep apnea syndrome Just saw Pulmonary and has CPAP  Hypothyroidism, unspecified type TSH N In 2/21 Age-related osteoporosis On Fosamax DEXA scan in 2018 Tscore -1.5 Will need Dexa scan  Polymyalgia rheumatica (HCC) Low Dose of Prednisone Symptoms Controlled Hyperlipidemia LDL goal <70 Repeat Lipid Panel Stage 3a chronic kidney disease Creat stable Anemia, unspecified type Will get iron studies Folate and B12 Fall, initial encounter Check Orthostatics Therapy to work with her Also D/w nurse to declutter her room  Depression  Controlled on Effexor H/o IBS Start on Psyllium Cognitive Impairment MMSE was 25/30 Passed Clock Recall and Counting missed   Family/ staff Communication:   Labs/tests ordered:  Lipid, B12 And Iron Studies

## 2019-08-02 ENCOUNTER — Encounter: Payer: Self-pay | Admitting: Nurse Practitioner

## 2019-08-02 ENCOUNTER — Non-Acute Institutional Stay: Payer: Medicare Other | Admitting: Nurse Practitioner

## 2019-08-02 DIAGNOSIS — F339 Major depressive disorder, recurrent, unspecified: Secondary | ICD-10-CM

## 2019-08-02 DIAGNOSIS — I2510 Atherosclerotic heart disease of native coronary artery without angina pectoris: Secondary | ICD-10-CM | POA: Diagnosis not present

## 2019-08-02 DIAGNOSIS — I10 Essential (primary) hypertension: Secondary | ICD-10-CM

## 2019-08-02 DIAGNOSIS — E039 Hypothyroidism, unspecified: Secondary | ICD-10-CM

## 2019-08-02 DIAGNOSIS — W19XXXA Unspecified fall, initial encounter: Secondary | ICD-10-CM | POA: Diagnosis not present

## 2019-08-02 DIAGNOSIS — K219 Gastro-esophageal reflux disease without esophagitis: Secondary | ICD-10-CM | POA: Diagnosis not present

## 2019-08-02 DIAGNOSIS — D519 Vitamin B12 deficiency anemia, unspecified: Secondary | ICD-10-CM

## 2019-08-02 DIAGNOSIS — R2681 Unsteadiness on feet: Secondary | ICD-10-CM

## 2019-08-02 DIAGNOSIS — N318 Other neuromuscular dysfunction of bladder: Secondary | ICD-10-CM

## 2019-08-02 NOTE — Assessment & Plan Note (Signed)
Stable, continue Pantoprazole.  

## 2019-08-02 NOTE — Progress Notes (Addendum)
Location:   [facility] Room Number: 903-A Place of Service:  ALF (13) Provider: Lennie Odor Lenoir Facchini NP  Hameed Kolar X, NP  Patient Care Team: Jebidiah Baggerly X, NP as PCP - General (Internal Medicine) Jettie Booze, MD as PCP - Cardiology (Cardiology) Wilford Corner, MD as Consulting Physician (Gastroenterology) Marlou Sa, Tonna Corner, MD as Consulting Physician (Orthopedic Surgery) Marcial Pacas, MD as Consulting Physician (Neurology) Franchot Gallo, MD as Consulting Physician (Urology) Kary Kos, MD as Consulting Physician (Neurosurgery)  Extended Emergency Contact Information Primary Emergency Contact: Merlinda Frederick Address: [address]          [place] [address] [place] Phone: [phone] Mobile Phone: [phone] Relation: Spouse Secondary Emergency Contact: [place] Mobile Phone: [phone] Relation: Son  Code Status: DNR Goals of care: Advanced Directive information Advanced Directives 08/02/2019  Does Patient Have a Medical Advance Directive? Yes  Type of Advance Directive Living will;Out of facility DNR (pink MOST or yellow form)  Does patient want to make changes to medical advance directive? No - Patient declined  Copy of [place] in Chart? -  Would patient like information on creating a medical advance directive? -  Pre-existing out of facility DNR order (yellow form or pink MOST form) Pink MOST form placed in chart (order not valid for inpatient use)     Chief Complaint  Patient presents with  . Acute Visit    Patient is seen for a fall.     HPI:  Pt is a 79 y.o. female seen today for an acute visit for fall reported 08/01/19 fall when the patient was found sitting on floor next to commode, no apparent injury, Bp 121/71. Then Bp 108/52,  100/50 sitting, 108/48 standing. The patient denied dizziness, chest pain/pressure, palpitation, or focal weakness associated with falling.   Hx of HTN,  on Metoprolol, CAD, on Isosorbide and Ramollazine.  Hypothyroidism, on Levothyroxine 66mg qd. Urinary frequency, stable, on Myrbetriq. GERD, stable, on Pantoprazole. Her mood is stable, on Venlafaxine '150mg'$  qd.    Past Medical History:  Diagnosis Date  . Anemia   . Arthritis    "fingers, back, shoulders, hips" (04/10/2016)  . Chronic diastolic CHF (congestive heart failure) (HCC)    [facility] >> Las[facility] started   . CKD (chronic kidney disease), stage III   . Coronary artery disease    a. s/p CABG 2000 (SVG/ free LIMA Y graft to the diagonal and distal LAD, SVG to the OM 1, and SVG to the PDA) . b. Cath 12/19/2014 90% dSVG to RCA s/p 2 overlapping DES. c. LHC 2016, 2017 no intervention except diuresis needed. d. Low risk nuc 03/2016. e. Cath 12/2017 patent LIMA-LAD, SVG-OM, SVG-PDA but occluded SVG to diag.   . Depression    with anxious component  . GERD (gastroesophageal reflux disease)   . History of hiatal hernia   . Hyperlipidemia   . Hypothyroidism   . Obesity   . OSA on CPAP   . PMR (polymyalgia rheumatica) (HCC)   . Pneumonia    "2-3 times" (04/10/2016)  . Stable angina (HCC)    microvascular, improved with Ranexa  . Subclavian artery stenosis (HCC)    a. L by cath note in 2016.  .Marland KitchenTIA (transient ischemic attack)    Past Surgical History:  Procedure Laterality Date  . BREAST BIOPSY Right 1964  . CARDIAC CATHETERIZATION  08   patent grafts, no culprit  lesions, EF 65%  . CARDIAC CATHETERIZATION N/A 12/19/2014   Procedure: Left Heart Cath and Cors/Grafts Angiography;  Surgeon: Jettie Booze, MD;  Location: [facility] [facility];  Service: Cardiovascular;  Laterality: N/A;  . CARDIAC CATHETERIZATION N/A 12/19/2014   Procedure: Coronary Stent Intervention;  Surgeon: Jettie Booze, MD;  Location: [place];  Service: Cardiovascular;  Laterality: N/A;  . CARDIAC CATHETERIZATION N/A 01/01/2015   Procedure: Left Heart Cath and Cors/Grafts  Angiography;  Surgeon: Jettie Booze, MD;  Location: [facility];  Service: Cardiovascular;  Laterality: N/A;  . CARDIAC CATHETERIZATION N/A 09/20/2015   Procedure: Left Heart Cath and Cors/Grafts Angiography;  Surgeon: Jettie Booze, MD;  Location: [facility];  Service: Cardiovascular;  Laterality: N/A;  . CATARACT EXTRACTION W/ INTRAOCULAR LENS  IMPLANT, BILATERAL Bilateral 2011  . COLONOSCOPY WITH PROPOFOL N/A 07/27/2016   Procedure: COLONOSCOPY WITH PROPOFOL;  Surgeon: Wilford Corner, MD;  Location: [facility];  Service: Endoscopy;  Laterality: N/A;  . CORONARY ARTERY BYPASS GRAFT  2000   ASCVD, multivessel, S./P.  . DILATION AND CURETTAGE OF UTERUS  1980s  . ESOPHAGOGASTRODUODENOSCOPY (EGD) WITH PROPOFOL N/A 07/27/2016   Procedure: ESOPHAGOGASTRODUODENOSCOPY (EGD) WITH PROPOFOL;  Surgeon: Wilford Corner, MD;  Location: [facility];  Service: Endoscopy;  Laterality: N/A;  . FRACTURE SURGERY    . LEFT HEART CATH AND CORS/GRAFTS ANGIOGRAPHY N/A 01/12/2018   Procedure: LEFT HEART CATH AND CORS/GRAFTS ANGIOGRAPHY;  Surgeon: Jettie Booze, MD;  Location: [facility];  Service: Cardiovascular;  Laterality: N/A;  . PATELLA FRACTURE SURGERY Left 1993    Allergies  Allergen Reactions  . Nsaids Other (See Comments)    Due to chronic kidney failure  . Butorphanol Other (See Comments)    agitation Constipation Produced a lot of urine, made patient feel crazy  . Sulfa Antibiotics Rash  . Bisoprolol Fumarate Other (See Comments)    Doesn't remember  . Butorphanol Tartrate Other (See Comments)    Produced a lot of urine, made patient feel crazy  . Codeine Nausea And Vomiting  . Demerol [Meperidine] Nausea And Vomiting  . Imipramine     Sweating, facial dysfunction   . Meperidine And Related Nausea And Vomiting  . Statins     MYALGIAS  . Tequin [Gatifloxacin] Other (See Comments)    Caused hypoglycemia Low blood sugar  . Brilinta [Ticagrelor]  Rash    CAUSES PETECHIAE, PURPURA  . Pseudoephedrine Hcl Palpitations  . Septra [Sulfamethoxazole-Trimethoprim] Rash  . Sulfamethoxazole-Trimethoprim Rash    Allergies as of 08/02/2019      Reactions   Nsaids Other (See Comments)   Due to chronic kidney failure   Butorphanol Other (See Comments)   agitation Constipation Produced a lot of urine, made patient feel crazy   Sulfa Antibiotics Rash   Bisoprolol Fumarate Other (See Comments)   Doesn't remember   Butorphanol Tartrate Other (See Comments)   Produced a lot of urine, made patient feel crazy   Codeine Nausea And Vomiting   Demerol [meperidine] Nausea And Vomiting   Imipramine    Sweating, facial dysfunction    Meperidine And Related Nausea And Vomiting   Statins    MYALGIAS   Tequin [gatifloxacin] Other (See Comments)   Caused hypoglycemia Low blood sugar   Brilinta [ticagrelor] Rash   CAUSES PETECHIAE, PURPURA   Pseudoephedrine Hcl Palpitations   Septra [sulfamethoxazole-trimethoprim] Rash   Sulfamethoxazole-trimethoprim Rash      Medication List  Accurate as of Aug 02, 2019 11:59 PM. If you have any questions, ask your nurse or doctor.        acetaminophen 325 MG tablet Commonly known as: TYLENOL Take 650 mg by mouth in the morning and at bedtime.   alendronate 70 MG tablet Commonly known as: FOSAMAX Take 70 mg by mouth every Sunday.   alum & mag hydroxide-simeth 563-875-64 MG/5ML suspension Commonly known as: MAALOX PLUS Take 10 mLs by mouth as needed for indigestion.   atorvastatin 10 MG tablet Commonly known as: LIPITOR TAKE 1 TABLET ONCE DAILY.   Biofreeze 10 % Liqd Generic drug: Menthol (Topical Analgesic) Apply topically as needed.   calcium carbonate 600 MG Tabs tablet Commonly known as: OS-CAL Take 600 mg by mouth 2 (two) times daily with a meal.   clopidogrel 75 MG tablet Commonly known as: PLAVIX TAKE 1 TABLET ONCE DAILY.   COENZYME Q-10 PO Take 10 mg by mouth at bedtime.    fluticasone 50 MCG/ACT nasal spray Commonly known as: FLONASE Place 2 sprays into both nostrils daily as needed for allergies.   Glucosamine 1500 Complex Caps Take 3 capsules by mouth daily after breakfast.   isosorbide mononitrate 60 MG 24 hr tablet Commonly known as: IMDUR Take 2 tablets (120 mg total) by mouth daily.   levocetirizine 5 MG tablet Commonly known as: XYZAL TAKE 1 TABLET ONCE DAILY IN THE EVENING.   levothyroxine 75 MCG tablet Commonly known as: SYNTHROID TAKE 1 TABLET ONCE DAILY ON EMPTY STOMACH.   melatonin 1 MG Tabs tablet Take by mouth at bedtime.   metoprolol succinate 50 MG 24 hr tablet Commonly known as: TOPROL-XL Take 50 mg by mouth in the morning and at bedtime. Take with or immediately following a meal.   Myrbetriq 50 MG Tb24 tablet Generic drug: mirabegron ER TAKE 1 TABLET BY MOUTH DAILY.   nitroGLYCERIN 0.4 MG SL tablet Commonly known as: NITROSTAT Place 0.4 mg under the tongue every 5 (five) minutes as needed for chest pain. X 3 doses   nystatin powder Commonly known as: MYCOSTATIN/NYSTOP Apply 1 application topically. apply underneath abd/breast folds As Needed   pantoprazole 40 MG tablet Commonly known as: PROTONIX Take 1 tablet (40 mg total) by mouth daily.   predniSONE 5 MG tablet Commonly known as: DELTASONE Take 5 mg by mouth daily.   PSYLLIUM HUSK PO Take 0.4 g by mouth daily. May keep at bedside   ranolazine 1000 MG SR tablet Commonly known as: RANEXA TAKE 1 TABLET BY MOUTH TWICE DAILY.   venlafaxine 75 MG tablet Commonly known as: EFFEXOR Take 150 mg by mouth. 2 caps= 150 mg; oral  Once A Day   Vitamin D 50 MCG (2000 UT) tablet Take 2,000 Units by mouth daily.   zinc oxide 20 % ointment Apply 1 application topically as needed for irritation.       Review of Systems  Constitutional: Negative for activity change, appetite change, fatigue and fever.  HENT: Positive for hearing loss. Negative for congestion and  voice change.   Eyes: Negative for visual disturbance.  Respiratory: Positive for shortness of breath. Negative for cough and wheezing.        DOE is chronic  Cardiovascular: Negative for chest pain and palpitations.  Gastrointestinal: Negative for abdominal distention, abdominal pain, constipation and vomiting.  Genitourinary: Negative for difficulty urinating and urgency.  Musculoskeletal: Positive for arthralgias, gait problem and myalgias.  Skin: Positive for wound. Negative for color change.  Neurological: Negative for  syncope, facial asymmetry, speech difficulty, weakness, light-headedness and headaches.  Psychiatric/Behavioral: Negative for agitation, behavioral problems, hallucinations and self-injury. The patient is not nervous/anxious.     Immunization History  Administered Date(s) Administered  . HPV Bivalent 11/24/2013, 01/17/2016  . Influenza Whole 12/23/2017  . Influenza, High Dose Seasonal PF 12/24/2018, 12/24/2018  . Influenza-Unspecified 01/10/2013, 12/27/2014, 12/30/2016  . Moderna SARS-COVID-2 Vaccination 03/25/2019, 04/22/2019  . Pneumococcal Conjugate-13 04/21/2013  . Pneumococcal Polysaccharide-23 02/27/1993, 03/23/2009  . Tdap 04/20/2012  . Zoster 11/10/2010   Pertinent  Health Maintenance Due  Topic Date Due  . INFLUENZA VACCINE  10/22/2019  . DEXA SCAN  Completed  . PNA vac Low Risk Adult  Completed   Fall Risk  02/10/2019 10/06/2018 08/18/2018 01/27/2018 12/07/2017  Falls in the past year? 0 '1 1 1 '$ Yes  Number falls in past yr: 0 0 0 1 2 or more  Injury with Fall? - 0 1 0 No   Functional Status Survey:    Vitals:   08/02/19 1517  BP: (!) 80/64  Pulse: 70  Resp: 20  Temp: (!) 97.2 F (36.2 C)  TempSrc: Oral  SpO2: 95%  Weight: 188 lb (85.3 kg)  Height: '4\' 10"'$  (1.473 m)   Body mass index is 39.29 kg/m. Physical Exam Vitals and nursing note reviewed.  Constitutional:      General: She is in acute distress.     Appearance: Normal appearance.    HENT:     Head: Normocephalic and atraumatic.     Mouth/Throat:     Mouth: Mucous membranes are moist.  Eyes:     Extraocular Movements: Extraocular movements intact.     Conjunctiva/sclera: Conjunctivae normal.     Pupils: Pupils are equal, round, and reactive to light.  Cardiovascular:     Rate and Rhythm: Normal rate and regular rhythm.     Heart sounds: No gallop.   Pulmonary:     Breath sounds: No wheezing or rales.  Abdominal:     General: Bowel sounds are normal. There is no distension.     Palpations: Abdomen is soft.     Tenderness: There is no abdominal tenderness.  Musculoskeletal:     Cervical back: Normal range of motion and neck supple.     Right lower leg: No edema.     Left lower leg: No edema.     Comments: Left thumb is in splint due to recent fx, no pain, tingling, or numbness in the left fingers  Skin:    General: Skin is warm and dry.     Comments: Skin tears left mid finger, left wrist, back of the left hand, no active bleeding or s/s of infection. Right elbow/right knee skin tear are covered in dressing .  Neurological:     General: No focal deficit present.     Mental Status: She is alert and oriented to person, place, and time. Mental status is at baseline.     Cranial Nerves: No cranial nerve deficit.     Motor: No weakness.     Coordination: Coordination normal.     Gait: Gait abnormal.  Psychiatric:        Mood and Affect: Mood normal.        Behavior: Behavior normal.        Thought Content: Thought content normal.        Judgment: Judgment normal.     Labs reviewed: Recent Labs    10/13/18 2040 10/13/18 2040 10/14/18 0229 10/14/18 0229 10/15/18 3536  10/15/18 0226 10/20/18 0000 02/14/19 0000 05/18/19 0000  NA 138   < > 140   < > 139  --  139 141 142  K 4.1   < > 4.2   < > 3.8   < > 3.9 5.0 4.7  CL 106   < > 110   < > 107  --  106 107 108  CO2 21*   < > 19*   < > 22  --  23 25* 25*  GLUCOSE 98  --  84  --  85  --   --   --   --    BUN 31*   < > 28*   < > 22  --  36* 22* 28*  CREATININE 1.62*   < > 1.40*   < > 1.34*  --  1.3* 1.2* 1.3*  CALCIUM 8.7*   < > 8.6*   < > 8.9  --  9.3 9.0 9.0   < > = values in this interval not displayed.   Recent Labs    10/20/18 0000 02/14/19 0000 05/18/19 0000  AST 10* 10* 7*  ALT 8 7 5*  ALKPHOS 44 41 39  PROT 5.9  --   --   ALBUMIN 3.6 3.6 3.7   Recent Labs    10/13/18 2040 10/13/18 2040 10/20/18 0000 02/14/19 0000 05/18/19 0000  WBC 11.4*  --  11.7 7.8 9.0  NEUTROABS 8.3*  --   --   --  5,814  HGB 11.3*   < > 11.1* 10.2* 10.1*  HCT 35.8*   < > 34* 31* 31*  MCV 97.3  --   --   --   --   PLT 253   < > 277 254 226   < > = values in this interval not displayed.   Lab Results  Component Value Date   TSH 1.97 05/18/2019   Lab Results  Component Value Date   HGBA1C 5.1 02/14/2019   Lab Results  Component Value Date   CHOL 181 02/14/2019   HDL 54 02/14/2019   LDLCALC 99 02/14/2019   TRIG 185 (A) 02/14/2019   CHOLHDL 2.1 06/07/2018    Significant Diagnostic Results in last 30 days:  No results found.  Assessment/Plan: Fall Hx of falls, reported 08/01/19 fall when the patient was found sitting on floor next to commode, no apparent injury, Bp 121/71. Then Bp 108/52,  100/50 sitting, 108/48 standing. The patient denied dizziness, chest pain/pressure, palpitation, or focal weakness associated with falling. Will supervise the patient for safety awareness. Will decrease Metoprolol '25mg'$  bid for loose Bp control. Update CBC/diff, CMP/eGFR 08/02/19 fall, the patient was found lying prone on floor in room, stated she was attempting to sweep floor and tripped over broom, resulted in ST R elbow, R knee, no active bleeding or s/s of infection.    CAD (coronary artery disease) Stable, continue Isosorbide, Ranolazine.   Essential hypertension Low Bp measurements, 84/41mHg during my examination today, will decrease Metoprolol '25mg'$  bid.   GERD (gastroesophageal reflux  disease) Stable, continue Pantoprazole.   Hypothyroidism Stable, TSH 1.97 05/18/19, continue Levothyroxine   Hypertonicity of bladder Stable, continue Myrbetriq  Depression, recurrent (HSan Luis Her mood is stable, continue Venlafaxine. MMSE 28/30 07/27/19  Gait instability Continue ambulation with walker, emphasized safety awareness with the patient   Vitamin B12 deficiency anemia 08/03/19 cholesterol 156, HDL 60, triglycerides 134, LDL 74, Iron 55, TIBC 245, Fe sat 22. Vit B12 162 08/07/19 Vit B12 10022m  po qd, CBC/Vit B12 one month.      Family/ staff Communication: plan of care reviewed with the patient and charge nurse.   Labs/tests ordered:  CBC/diff, CMP/eGFR  Time spend 40 minutes.

## 2019-08-02 NOTE — Assessment & Plan Note (Addendum)
Low Bp measurements, 84/76mmHg during my examination today, will decrease Metoprolol 25mg  bid.

## 2019-08-02 NOTE — Assessment & Plan Note (Addendum)
Stable, continue Isosorbide, Ranolazine.

## 2019-08-02 NOTE — Assessment & Plan Note (Signed)
Continue ambulation with walker, emphasized safety awareness with the patient

## 2019-08-02 NOTE — Assessment & Plan Note (Addendum)
Stable, TSH 1.97 05/18/19, continue Levothyroxine

## 2019-08-02 NOTE — Assessment & Plan Note (Addendum)
Hx of falls, reported 08/01/19 fall when the patient was found sitting on floor next to commode, no apparent injury, Bp 121/71. Then Bp 108/52,  100/50 sitting, 108/48 standing. The patient denied dizziness, chest pain/pressure, palpitation, or focal weakness associated with falling. Will supervise the patient for safety awareness. Will decrease Metoprolol 51m bid for loose Bp control. Update CBC/diff, CMP/eGFR 08/02/19 fall, the patient was found lying prone on floor in room, stated she was attempting to sweep floor and tripped over broom, resulted in ST R elbow, R knee, no active bleeding or s/s of infection.

## 2019-08-02 NOTE — Assessment & Plan Note (Signed)
Her mood is stable, continue Venlafaxine. MMSE 28/30 07/27/19

## 2019-08-02 NOTE — Assessment & Plan Note (Signed)
Stable, continue Myrbetriq °

## 2019-08-03 ENCOUNTER — Encounter: Payer: Self-pay | Admitting: Nurse Practitioner

## 2019-08-03 LAB — IRON,TIBC AND FERRITIN PANEL
%SAT: 22
Iron: 55
TIBC: 245

## 2019-08-03 LAB — CBC: RBC: 3.28 — AB (ref 3.87–5.11)

## 2019-08-03 LAB — BASIC METABOLIC PANEL
BUN: 24 — AB (ref 4–21)
CO2: 25 — AB (ref 13–22)
Chloride: 108 (ref 99–108)
Creatinine: 1.3 — AB (ref 0.5–1.1)
Glucose: 85
Potassium: 4.3 (ref 3.4–5.3)
Sodium: 141 (ref 137–147)

## 2019-08-03 LAB — LIPID PANEL
Cholesterol: 156 (ref 0–200)
HDL: 60 (ref 35–70)
LDL Cholesterol: 74
LDl/HDL Ratio: 2.6
Triglycerides: 134 (ref 40–160)

## 2019-08-03 LAB — VITAMIN B12: Vitamin B-12: 162

## 2019-08-03 LAB — HEPATIC FUNCTION PANEL
AST: 8 — AB (ref 13–35)
Alkaline Phosphatase: 37 (ref 25–125)
Bilirubin, Total: 0.4

## 2019-08-03 LAB — COMPREHENSIVE METABOLIC PANEL
Albumin: 3.5 (ref 3.5–5.0)
Calcium: 8.6 — AB (ref 8.7–10.7)
Globulin: 1.8

## 2019-08-03 LAB — CBC AND DIFFERENTIAL
HCT: 322 — AB (ref 36–46)
Hemoglobin: 10.3 — AB (ref 12.0–16.0)
Neutrophils Absolute: 6646
Platelets: 244 (ref 150–399)
WBC: 10.6

## 2019-08-03 NOTE — Progress Notes (Addendum)
Location:      Place of Service:    Provider: Marlana Latus NP  Kenzlei Runions X, NP  Patient Care Team: Tomi Paddock X, NP as PCP - General (Internal Medicine) Jettie Booze, MD as PCP - Cardiology (Cardiology) Wilford Corner, MD as Consulting Physician (Gastroenterology) Marlou Sa, Tonna Corner, MD as Consulting Physician (Orthopedic Surgery) Marcial Pacas, MD as Consulting Physician (Neurology) Franchot Gallo, MD as Consulting Physician (Urology) Kary Kos, MD as Consulting Physician (Neurosurgery)  Extended Emergency Contact Information Primary Emergency Contact: Merlinda Frederick Address: [address]          [place] [address] [place] Phone: [phone] Mobile Phone: [phone] Relation: Spouse Secondary Emergency Contact: [place] Mobile Phone: [phone] Relation: Son  Code Status: DNR Goals of care: Advanced Directive information Advanced Directives 08/02/2019  Does Patient Have a Medical Advance Directive? Yes  Type of Advance Directive Living will;Out of facility DNR (pink MOST or yellow form)  Does patient want to make changes to medical advance directive? No - Patient declined  Copy of [place] in Chart? -  Would patient like information on creating a medical advance directive? -  Pre-existing out of facility DNR order (yellow form or pink MOST form) Pink MOST form placed in chart (order not valid for inpatient use)     Chief Complaint  Patient presents with  . Error    HPI:  Pt is a 79 y.o. female seen today for an acute visit for    Past Medical History:  Diagnosis Date  . Anemia   . Arthritis    "fingers, back, shoulders, hips" (04/10/2016)  . Chronic diastolic CHF (congestive heart failure) (HCC)    a. normal EF, LVEDP at [facility] in 6/17 33 >> Lasix started   . CKD (chronic kidney disease), stage III   . Coronary artery disease    a. s/p CABG 2000 (SVG/ free LIMA Y graft to the diagonal  and distal LAD, SVG to the OM 1, and SVG to the PDA) . b. Cath 12/19/2014 90% dSVG to RCA s/p 2 overlapping DES. c. LHC 2016, 2017 no intervention except diuresis needed. d. Low risk nuc 03/2016. e. Cath 12/2017 patent LIMA-LAD, SVG-OM, SVG-PDA but occluded SVG to diag.   . Depression    with anxious component  . GERD (gastroesophageal reflux disease)   . History of hiatal hernia   . Hyperlipidemia   . Hypothyroidism   . Obesity   . OSA on CPAP   . PMR (polymyalgia rheumatica) (HCC)   . Pneumonia    "2-3 times" (04/10/2016)  . Stable angina (HCC)    microvascular, improved with Ranexa  . Subclavian artery stenosis (HCC)    a. L by cath note in 2016.  Marland Kitchen TIA (transient ischemic attack)    Past Surgical History:  Procedure Laterality Date  . BREAST BIOPSY Right 1964  . CARDIAC CATHETERIZATION  08   patent grafts, no culprit lesions, EF 65%  . CARDIAC CATHETERIZATION N/A 12/19/2014   Procedure: Left Heart Cath and Cors/Grafts Angiography;  Surgeon: Jettie Booze, MD;  Location: [place] [facility];  Service: Cardiovascular;  Laterality: N/A;  . CARDIAC CATHETERIZATION N/A 12/19/2014   Procedure: Coronary Stent Intervention;  Surgeon: Jettie Booze, MD;  Location: [place] [facility];  Service: Cardiovascular;  Laterality: N/A;  . CARDIAC CATHETERIZATION N/A 01/01/2015   Procedure: Left Heart Cath and Cors/Grafts Angiography;  Surgeon: Jettie Booze, MD;  Location: [place]  [facility];  Service: Cardiovascular;  Laterality: N/A;  . CARDIAC CATHETERIZATION N/A 09/20/2015   Procedure: Left Heart Cath and Cors/Grafts Angiography;  Surgeon: Jettie Booze, MD;  Location: [place] [facility];  Service: Cardiovascular;  Laterality: N/A;  . CATARACT EXTRACTION W/ INTRAOCULAR LENS  IMPLANT, BILATERAL Bilateral 2011  . COLONOSCOPY WITH PROPOFOL N/A 07/27/2016   Procedure: COLONOSCOPY WITH PROPOFOL;  Surgeon: Wilford Corner, MD;  Location: [place];  Service: Endoscopy;   Laterality: N/A;  . CORONARY ARTERY BYPASS GRAFT  2000   ASCVD, multivessel, S./P.  . DILATION AND CURETTAGE OF UTERUS  1980s  . ESOPHAGOGASTRODUODENOSCOPY (EGD) WITH PROPOFOL N/A 07/27/2016   Procedure: ESOPHAGOGASTRODUODENOSCOPY (EGD) WITH PROPOFOL;  Surgeon: Wilford Corner, MD;  Location: [place];  Service: Endoscopy;  Laterality: N/A;  . FRACTURE SURGERY    . LEFT HEART CATH AND CORS/GRAFTS ANGIOGRAPHY N/A 01/12/2018   Procedure: LEFT HEART CATH AND CORS/GRAFTS ANGIOGRAPHY;  Surgeon: Jettie Booze, MD;  Location: [place] [facility];  Service: Cardiovascular;  Laterality: N/A;  . PATELLA FRACTURE SURGERY Left 1993    Allergies  Allergen Reactions  . Nsaids Other (See Comments)    Due to chronic kidney failure  . Butorphanol Other (See Comments)    agitation Constipation Produced a lot of urine, made patient feel crazy  . Sulfa Antibiotics Rash  . Bisoprolol Fumarate Other (See Comments)    Doesn't remember  . Butorphanol Tartrate Other (See Comments)    Produced a lot of urine, made patient feel crazy  . Codeine Nausea And Vomiting  . Demerol [Meperidine] Nausea And Vomiting  . Imipramine     Sweating, facial dysfunction   . Meperidine And Related Nausea And Vomiting  . Statins     MYALGIAS  . Tequin [Gatifloxacin] Other (See Comments)    Caused hypoglycemia Low blood sugar  . Brilinta [Ticagrelor] Rash    CAUSES PETECHIAE, PURPURA  . Pseudoephedrine Hcl Palpitations  . Septra [Sulfamethoxazole-Trimethoprim] Rash  . Sulfamethoxazole-Trimethoprim Rash    Allergies as of 08/03/2019      Reactions   Nsaids Other (See Comments)   Due to chronic kidney failure   Butorphanol Other (See Comments)   agitation Constipation Produced a lot of urine, made patient feel crazy   Sulfa Antibiotics Rash   Bisoprolol Fumarate Other (See Comments)   Doesn't remember   Butorphanol Tartrate Other (See Comments)   Produced a lot of urine, made patient feel crazy    Codeine Nausea And Vomiting   Demerol [meperidine] Nausea And Vomiting   Imipramine    Sweating, facial dysfunction    Meperidine And Related Nausea And Vomiting   Statins    MYALGIAS   Tequin [gatifloxacin] Other (See Comments)   Caused hypoglycemia Low blood sugar   Brilinta [ticagrelor] Rash   CAUSES PETECHIAE, PURPURA   Pseudoephedrine Hcl Palpitations   Septra [sulfamethoxazole-trimethoprim] Rash   Sulfamethoxazole-trimethoprim Rash      Medication List       Accurate as of Aug 03, 2019 11:59 PM. If you have any questions, ask your nurse or doctor.        acetaminophen 325 MG tablet Commonly known as: TYLENOL Take 650 mg by mouth in the morning and at bedtime.   alendronate 70 MG tablet Commonly known as: FOSAMAX Take 70 mg by mouth every Sunday.   alum & mag hydroxide-simeth [id number] MG/5ML suspension Commonly known as: MAALOX PLUS Take 10 mLs by mouth as needed for indigestion.   atorvastatin 10 MG tablet  Commonly known as: LIPITOR TAKE 1 TABLET ONCE DAILY.   Biofreeze 10 % Liqd Generic drug: Menthol (Topical Analgesic) Apply topically as needed.   calcium carbonate 600 MG Tabs tablet Commonly known as: OS-CAL Take 600 mg by mouth 2 (two) times daily with a meal.   clopidogrel 75 MG tablet Commonly known as: PLAVIX TAKE 1 TABLET ONCE DAILY.   COENZYME Q-10 PO Take 10 mg by mouth at bedtime.   fluticasone 50 MCG/ACT nasal spray Commonly known as: FLONASE Place 2 sprays into both nostrils daily as needed for allergies.   Glucosamine 1500 Complex Caps Take 3 capsules by mouth daily after breakfast.   isosorbide mononitrate 60 MG 24 hr tablet Commonly known as: IMDUR Take 2 tablets (120 mg total) by mouth daily.   levocetirizine 5 MG tablet Commonly known as: XYZAL TAKE 1 TABLET ONCE DAILY IN THE EVENING.   levothyroxine 75 MCG tablet Commonly known as: SYNTHROID TAKE 1 TABLET ONCE DAILY ON EMPTY STOMACH.   melatonin 1 MG Tabs  tablet Take by mouth at bedtime.   metoprolol succinate 50 MG 24 hr tablet Commonly known as: TOPROL-XL Take 50 mg by mouth in the morning and at bedtime. Take with or immediately following a meal.   Myrbetriq 50 MG Tb24 tablet Generic drug: mirabegron ER TAKE 1 TABLET BY MOUTH DAILY.   nitroGLYCERIN 0.4 MG SL tablet Commonly known as: NITROSTAT Place 0.4 mg under the tongue every 5 (five) minutes as needed for chest pain. X 3 doses   nystatin powder Commonly known as: MYCOSTATIN/NYSTOP Apply 1 application topically. apply underneath abd/breast folds As Needed   pantoprazole 40 MG tablet Commonly known as: PROTONIX Take 1 tablet (40 mg total) by mouth daily.   predniSONE 5 MG tablet Commonly known as: DELTASONE Take 5 mg by mouth daily.   PSYLLIUM HUSK PO Take 0.4 g by mouth daily. May keep at bedside   ranolazine 1000 MG SR tablet Commonly known as: RANEXA TAKE 1 TABLET BY MOUTH TWICE DAILY.   venlafaxine 75 MG tablet Commonly known as: EFFEXOR Take 150 mg by mouth. 2 caps= 150 mg; oral  Once A Day   Vitamin D 50 MCG (2000 UT) tablet Take 2,000 Units by mouth daily.   zinc oxide 20 % ointment Apply 1 application topically as needed for irritation.       Review of Systems  Immunization History  Administered Date(s) Administered  . HPV Bivalent 11/24/2013, 01/17/2016  . Influenza Whole 12/23/2017  . Influenza, High Dose Seasonal PF 12/24/2018, 12/24/2018  . Influenza-Unspecified 01/10/2013, 12/27/2014, 12/30/2016  . Moderna SARS-COVID-2 Vaccination 03/25/2019, 04/22/2019  . Pneumococcal Conjugate-13 04/21/2013  . Pneumococcal Polysaccharide-23 02/27/1993, 03/23/2009  . Tdap 04/20/2012  . Zoster 11/10/2010   Pertinent  Health Maintenance Due  Topic Date Due  . INFLUENZA VACCINE  10/22/2019  . DEXA SCAN  Completed  . PNA vac Low Risk Adult  Completed   Fall Risk  02/10/2019 10/06/2018 08/18/2018 01/27/2018 12/07/2017  Falls in the past year? 0 1 1 1  Yes   Number falls in past yr: 0 0 0 1 2 or more  Injury with Fall? - 0 1 0 No   Functional Status Survey:    There were no vitals filed for this visit. There is no height or weight on file to calculate BMI. Physical Exam  Labs reviewed: Recent Labs    10/13/18 2040 10/13/18 2040 10/14/18 0229 10/14/18 0229 10/15/18 0226 10/15/18 0226 10/20/18 0000 02/14/19 0000 05/18/19 0000  NA  138   < > 140   < > 139  --  139 141 142  K 4.1   < > 4.2   < > 3.8   < > 3.9 5.0 4.7  CL 106   < > 110   < > 107  --  106 107 108  CO2 21*   < > 19*   < > 22  --  23 25* 25*  GLUCOSE 98  --  84  --  85  --   --   --   --   BUN 31*   < > 28*   < > 22  --  36* 22* 28*  CREATININE 1.62*   < > 1.40*   < > 1.34*  --  1.3* 1.2* 1.3*  CALCIUM 8.7*   < > 8.6*   < > 8.9  --  9.3 9.0 9.0   < > = values in this interval not displayed.   Recent Labs    10/20/18 0000 02/14/19 0000 05/18/19 0000  AST 10* 10* 7*  ALT 8 7 5*  ALKPHOS 44 41 39  PROT 5.9  --   --   ALBUMIN 3.6 3.6 3.7   Recent Labs    10/13/18 2040 10/13/18 2040 10/20/18 0000 02/14/19 0000 05/18/19 0000  WBC 11.4*  --  11.7 7.8 9.0  NEUTROABS 8.3*  --   --   --  5,814  HGB 11.3*   < > 11.1* 10.2* 10.1*  HCT 35.8*   < > 34* 31* 31*  MCV 97.3  --   --   --   --   PLT 253   < > 277 254 226   < > = values in this interval not displayed.   Lab Results  Component Value Date   TSH 1.97 05/18/2019   Lab Results  Component Value Date   HGBA1C 5.1 02/14/2019   Lab Results  Component Value Date   CHOL 181 02/14/2019   HDL 54 02/14/2019   LDLCALC 99 02/14/2019   TRIG 185 (A) 02/14/2019   CHOLHDL 2.1 06/07/2018    Significant Diagnostic Results in last 30 days:  No results found.  Assessment/Plan: No problem-specific Assessment & Plan notes found for this encounter.    Family/ staff Communication:   Labs/tests ordered:  This encounter was created in error - please disregard.

## 2019-08-07 NOTE — Assessment & Plan Note (Signed)
08/03/19 cholesterol 156, HDL 60, triglycerides 134, LDL 74, Iron 55, TIBC 245, Fe sat 22. Vit B12 162 08/07/19 Vit B12 1076mcg po qd, CBC/Vit B12 one month.

## 2019-08-10 ENCOUNTER — Other Ambulatory Visit: Payer: Self-pay

## 2019-08-10 ENCOUNTER — Ambulatory Visit (INDEPENDENT_AMBULATORY_CARE_PROVIDER_SITE_OTHER): Payer: Medicare Other | Admitting: Interventional Cardiology

## 2019-08-10 ENCOUNTER — Encounter: Payer: Self-pay | Admitting: Interventional Cardiology

## 2019-08-10 VITALS — BP 124/56 | HR 73 | Ht <= 58 in | Wt 190.0 lb

## 2019-08-10 DIAGNOSIS — I771 Stricture of artery: Secondary | ICD-10-CM

## 2019-08-10 DIAGNOSIS — I10 Essential (primary) hypertension: Secondary | ICD-10-CM

## 2019-08-10 DIAGNOSIS — I5032 Chronic diastolic (congestive) heart failure: Secondary | ICD-10-CM | POA: Diagnosis not present

## 2019-08-10 DIAGNOSIS — E785 Hyperlipidemia, unspecified: Secondary | ICD-10-CM

## 2019-08-10 DIAGNOSIS — I2510 Atherosclerotic heart disease of native coronary artery without angina pectoris: Secondary | ICD-10-CM | POA: Diagnosis not present

## 2019-08-10 NOTE — Progress Notes (Signed)
Cardiology Office Note   Date:  08/10/2019   ID:  [name], Ingram 13-Oct-1940, MRN [id number]  PCP:  Mast, Man X, NP    No chief complaint on file.  CAD  Wt Readings from Last 3 Encounters:  08/10/19 190 lb (86.2 kg)  08/02/19 188 lb (85.3 kg)  07/27/19 188 lb (85.3 kg)       History of Present Illness: Erin Ingram is a 79 y.o. female   with history ofCAD (CABG 2000, 2 DES to SVG - RCA 11/2014), polymyalgia rheumatica on steroids, TIA, OSA on CPAP, hypothyroidism, HTN, HLD, CKD stage III, left subclavian artery stenosis and chronic diastolic heart failure.  She has history of CABG in 2000 with SVG/free LIMA Y graft to the diagonal and distal LAD, SVG to OM1 and SVG to PDA. She underwent 2 DES to SVG to RCA in September 2016. Cath in 2017 showed stable anatomy with occluded SVG-diag and she had a low risk myoview back in 2018. On 01/11/18 she recently presented to the hospital with a weeks' worth of intermittent chest pain, left arm pain and back pain, mostly at night when at rest. She was admitted with troponins cycled and negative x3. Given her symptoms she was set up for cardiac cath noted above with patent LIMA-LAD, SVG-OM, SVG-PDA but occluded SVG to diag. Normal LVEDP and EF noted at 55-65%. The recommendation was to continue with medical therapy at this time.  The patient previously stopped ASA due to easy bruising. The diagnosis of L subclavian stenosis comes from cath report 11/2014 indicated Left subclavian stenosis prevented left radial access to the aorta. Carotid duplex 2017 showed no significant findings.  Repeat shoulder pain prompted cath in 2019 which showed:  Prox LAD to Mid LAD lesion is 25% stenosed.  Ost Cx to Dist Cx lesion is 90% stenosed. SVG to OM is widely patent.  Mid RCA lesion is 90% stenosed. SVG to PDA is patent. Patent stents in distal graft.  SVG to diagonal is occluded.  Mid LAD lesion is 90% stenosed. LIMA to LAD is patent.  The  left ventricular systolic function is normal.  LV end diastolic pressure is normal. LVEDP 10 mm Hg.  The left ventricular ejection fraction is 55-65% by visual estimate.  There is no aortic valve stenosis.  Pain in shoulder reoslved with PT.  In the past, it was noted, "She does not exercise. SHe has some DOE. She has chronic fatigue. She uses CPAP."  She was hospitalized in 07/2018.  SHe got lightheaded.  SHe nearly passed out.  BP meds were decreased.  She feels better on the current dose of medicines.  The patient does not have symptoms concerning for COVID-19 infection (fever, chills, cough, or new shortness of breath).   Living in [place].  Fall in 07/2019- found sitting next to commode.  She has fallen a few times.  Trips on something.  No syncope.  She had hit her head in the past.   Denies : Chest pain. Dizziness. Leg edema. Nitroglycerin use. Orthopnea. Palpitations. Paroxysmal nocturnal dyspnea.  Syncope.   Most strenuous exercise is walking to dining hall at [facility].  She will start balance exercises.   Husband is now wheelchair bound so they moved to assisted living.    Past Medical History:  Diagnosis Date  . Anemia   . Arthritis    "fingers, back, shoulders, hips" (04/10/2016)  . Chronic diastolic CHF (congestive heart failure) ([place])  a. normal EF, LVEDP at [facility] in 6/17 33 >> Lasix started   . CKD (chronic kidney disease), stage III   . Coronary artery disease    a. s/p CABG 2000 (SVG/ free LIMA Y graft to the diagonal and distal LAD, SVG to the OM 1, and SVG to the PDA) . b. Cath 12/19/2014 90% dSVG to RCA s/p 2 overlapping DES. c. LHC 2016, 2017 no intervention except diuresis needed. d. Low risk nuc 03/2016. e. Cath 12/2017 patent LIMA-LAD, SVG-OM, SVG-PDA but occluded SVG to diag.   . Depression    with anxious component  . GERD (gastroesophageal reflux disease)   . History of hiatal hernia   . Hyperlipidemia   . Hypothyroidism   . Obesity   .  OSA on CPAP   . PMR (polymyalgia rheumatica) (HCC)   . Pneumonia    "2-3 times" (04/10/2016)  . Stable angina (HCC)    microvascular, improved with Ranexa  . Subclavian artery stenosis (HCC)    a. L by cath note in 2016.  Marland Kitchen TIA (transient ischemic attack)     Past Surgical History:  Procedure Laterality Date  . BREAST BIOPSY Right 1964  . CARDIAC CATHETERIZATION  08   patent grafts, no culprit lesions, EF 65%  . CARDIAC CATHETERIZATION N/A 12/19/2014   Procedure: Left Heart Cath and Cors/Grafts Angiography;  Surgeon: Jettie Booze, MD;  Location: [place] [facility];  Service: Cardiovascular;  Laterality: N/A;  . CARDIAC CATHETERIZATION N/A 12/19/2014   Procedure: Coronary Stent Intervention;  Surgeon: Jettie Booze, MD;  Location: [place] [facility];  Service: Cardiovascular;  Laterality: N/A;  . CARDIAC CATHETERIZATION N/A 01/01/2015   Procedure: Left Heart Cath and Cors/Grafts Angiography;  Surgeon: Jettie Booze, MD;  Location: [place] [facility];  Service: Cardiovascular;  Laterality: N/A;  . CARDIAC CATHETERIZATION N/A 09/20/2015   Procedure: Left Heart Cath and Cors/Grafts Angiography;  Surgeon: Jettie Booze, MD;  Location: [facility];  Service: Cardiovascular;  Laterality: N/A;  . CATARACT EXTRACTION W/ INTRAOCULAR LENS  IMPLANT, BILATERAL Bilateral 2011  . COLONOSCOPY WITH PROPOFOL N/A 07/27/2016   Procedure: COLONOSCOPY WITH PROPOFOL;  Surgeon: Wilford Corner, MD;  Location: [place] [facility];  Service: Endoscopy;  Laterality: N/A;  . CORONARY ARTERY BYPASS GRAFT  2000   ASCVD, multivessel, S./P.  . DILATION AND CURETTAGE OF UTERUS  1980s  . ESOPHAGOGASTRODUODENOSCOPY (EGD) WITH PROPOFOL N/A 07/27/2016   Procedure: ESOPHAGOGASTRODUODENOSCOPY (EGD) WITH PROPOFOL;  Surgeon: Wilford Corner, MD;  Location: [facility];  Service: Endoscopy;  Laterality: N/A;  . FRACTURE SURGERY    . LEFT HEART CATH AND CORS/GRAFTS ANGIOGRAPHY N/A 01/12/2018    Procedure: LEFT HEART CATH AND CORS/GRAFTS ANGIOGRAPHY;  Surgeon: Jettie Booze, MD;  Location: [place] [facility];  Service: Cardiovascular;  Laterality: N/A;  . PATELLA FRACTURE SURGERY Left 1993     Current Outpatient Medications  Medication Sig Dispense Refill  . acetaminophen (TYLENOL) 325 MG tablet Take 650 mg by mouth in the morning and at bedtime.    Marland Kitchen alendronate (FOSAMAX) 70 MG tablet Take 70 mg by mouth every Sunday.     Marland Kitchen alum & mag hydroxide-simeth (MAALOX PLUS) 400-400-40 MG/5ML suspension Take 10 mLs by mouth as needed for indigestion.    Marland Kitchen atorvastatin (LIPITOR) 10 MG tablet TAKE 1 TABLET ONCE DAILY. 90 tablet 2  . calcium carbonate (OS-CAL) 600 MG TABS tablet Take 600 mg by mouth 2 (two) times daily with a meal.    .  Cholecalciferol (VITAMIN D) 50 MCG (2000 UT) tablet Take 2,000 Units by mouth daily.    . clopidogrel (PLAVIX) 75 MG tablet TAKE 1 TABLET ONCE DAILY. 90 tablet 3  . COENZYME Q-10 PO Take 10 mg by mouth at bedtime.    . fluticasone (FLONASE) 50 MCG/ACT nasal spray Place 2 sprays into both nostrils daily as needed for allergies.     . Glucosamine-Chondroit-Vit C-Mn (GLUCOSAMINE 1500 COMPLEX) CAPS Take 3 capsules by mouth daily after breakfast.     . isosorbide mononitrate (IMDUR) 60 MG 24 hr tablet Take 2 tablets (120 mg total) by mouth daily. 180 tablet 3  . levocetirizine (XYZAL) 5 MG tablet TAKE 1 TABLET ONCE DAILY IN THE EVENING. 90 tablet 1  . levothyroxine (SYNTHROID) 75 MCG tablet TAKE 1 TABLET ONCE DAILY ON EMPTY STOMACH. 90 tablet 1  . Melatonin 1 MG TABS Take by mouth at bedtime.    . Menthol, Topical Analgesic, (BIOFREEZE) 10 % LIQD Apply topically as needed.    . metoprolol succinate (TOPROL-XL) 50 MG 24 hr tablet Take 50 mg by mouth in the morning and at bedtime. Take with or immediately following a meal.    . MYRBETRIQ 50 MG TB24 tablet TAKE 1 TABLET BY MOUTH DAILY. 90 tablet 0  . nitroGLYCERIN (NITROSTAT) 0.4 MG SL tablet Place 0.4 mg under  the tongue every 5 (five) minutes as needed for chest pain. X 3 doses    . nystatin (MYCOSTATIN/NYSTOP) powder Apply 1 application topically. apply underneath abd/breast folds As Needed    . pantoprazole (PROTONIX) 40 MG tablet Take 1 tablet (40 mg total) by mouth daily. 90 tablet 3  . predniSONE (DELTASONE) 5 MG tablet Take 5 mg by mouth daily.     . PSYLLIUM HUSK PO Take 0.4 g by mouth daily. May keep at bedside    . ranolazine (RANEXA) 1000 MG SR tablet TAKE 1 TABLET BY MOUTH TWICE DAILY. 180 tablet 2  . venlafaxine (EFFEXOR) 75 MG tablet Take 150 mg by mouth. 2 caps= 150 mg; oral  Once A Day    . zinc oxide 20 % ointment Apply 1 application topically as needed for irritation.     No current facility-administered medications for this visit.    Allergies:   Nsaids, Butorphanol, Sulfa antibiotics, Bisoprolol fumarate, Butorphanol tartrate, Codeine, Demerol [meperidine], Imipramine, Meperidine and related, Statins, Tequin [gatifloxacin], Brilinta [ticagrelor], Pseudoephedrine hcl, Septra [sulfamethoxazole-trimethoprim], and Sulfamethoxazole-trimethoprim    Social History:  The patient  reports that she quit smoking about 50 years ago. Her smoking use included cigarettes. She has a 3.50 pack-year smoking history. She has never used smokeless tobacco. She reports that she does not drink alcohol or use drugs.   Family History:  The patient's family history includes Diabetes in her brother; Heart attack in her father and mother; Heart disease in her father and mother; Pulmonary embolism in her brother; Stroke in her paternal grandmother.    ROS:  Please see the history of present illness.   Otherwise, review of systems are positive for falls.   All other systems are reviewed and negative.    PHYSICAL EXAM: VS:  BP (!) 124/56   Pulse 73   Ht 4\' 10"  (1.473 m)   Wt 190 lb (86.2 kg)   SpO2 91%   BMI 39.71 kg/m  , BMI Body mass index is 39.71 kg/m. GEN: Well nourished, well developed, in no  acute distress  HEENT: normal  Neck: no JVD, carotid bruits, or masses Cardiac: RRR;  no murmurs, rubs, or gallops,no edema ; 2+ radial pulses bilaterally Respiratory:  clear to auscultation bilaterally, normal work of breathing GI: soft, nontender, nondistended, + BS MS: no deformity or atrophy  Skin: warm and dry, no rash Neuro:  Strength and sensation are intact Psych: euthymic mood, full affect   EKG:   The ekg ordered today demonstrates NSR, low voltage, no ST changes   Recent Labs: 10/13/2018: B Natriuretic Peptide 318.5 05/18/2019: ALT 5; BUN 28; Creatinine 1.3; Hemoglobin 10.1; Platelets 226; Potassium 4.7; Sodium 142; TSH 1.97   Lipid Panel    Component Value Date/Time   CHOL 181 02/14/2019 0000   TRIG 185 (A) 02/14/2019 0000   HDL 54 02/14/2019 0000   CHOLHDL 2.1 06/07/2018 0710   VLDL 40 01/12/2018 0549   LDLCALC 99 02/14/2019 0000   LDLCALC 65 06/07/2018 0710     Other studies Reviewed: Additional studies/ records that were reviewed today with results demonstrating: Hospital records reviewed. PMD labs reviewed   ASSESSMENT AND PLAN:  1. CAD: No angina.  Had some low BPs so metoprolol was decreased to 25 mg BID. Could decrease to 25 mg daily if BP was low. 2. Hyperlipidemia: LDL 99 in 01/2019.  Continue atorvastatin.  Had muscle pain with higher dose most likely.  Better on the lower dose.   3. HTN: The current medical regimen is effective;  continue present plan and medications. 4. Morbid obesity: intentional weight loss 5. Chronic diastolic heart failure: Appears euvolemic.    Current medicines are reviewed at length with the patient today.  The patient concerns regarding her medicines were addressed.  The following changes have been made:  No change  Labs/ tests ordered today include:  No orders of the defined types were placed in this encounter.   Recommend 150 minutes/week of aerobic exercise Low fat, low carb, high fiber diet recommended   Disposition:   FU in 1 year   Signed, Larae Grooms, MD  08/10/2019 2:42 PM    [facility] [address], [place], [place]  [address] Phone: [phone]; Fax: [phone]

## 2019-08-10 NOTE — Patient Instructions (Signed)
Medication Instructions:  Your physician recommends that you continue on your current medications as directed. Please refer to the Current Medication list given to you today.  *If you need a refill on your cardiac medications before your next appointment, please call your pharmacy*   Lab Work: None ordered  If you have labs (blood work) drawn today and your tests are completely normal, you will receive your results only by: . MyChart Message (if you have MyChart) OR . A paper copy in the mail If you have any lab test that is abnormal or we need to change your treatment, we will call you to review the results.   Testing/Procedures: None ordered   Follow-Up: At [facility], you and your health needs are our priority.  As part of our continuing mission to provide you with exceptional heart care, we have created designated Provider Care Teams.  These Care Teams include your primary Cardiologist (physician) and Advanced Practice Providers (APPs -  Physician Assistants and Nurse Practitioners) who all work together to provide you with the care you need, when you need it.  We recommend signing up for the patient portal called "MyChart".  Sign up information is provided on this After Visit Summary.  MyChart is used to connect with patients for Virtual Visits (Telemedicine).  Patients are able to view lab/test results, encounter notes, upcoming appointments, etc.  Non-urgent messages can be sent to your provider as well.   To learn more about what you can do with MyChart, go to [email or web address].    Your next appointment:   12 month(s)  The format for your next appointment:   In Person  Provider:   You may see Jayadeep Varanasi, MD or one of the following Advanced Practice Providers on your designated Care Team:    Dayna Dunn, PA-C  Michele Lenze, PA-C    Other Instructions None  

## 2019-09-06 LAB — CBC AND DIFFERENTIAL
HCT: 35 — AB (ref 36–46)
Hemoglobin: 11.4 — AB (ref 12.0–16.0)
Platelets: 256 (ref 150–399)
WBC: 11

## 2019-09-06 LAB — CBC: RBC: 3.5 — AB (ref 3.87–5.11)

## 2019-09-06 LAB — VITAMIN B12: Vitamin B-12: 368

## 2019-09-08 ENCOUNTER — Encounter: Payer: Self-pay | Admitting: Nurse Practitioner

## 2019-09-08 DIAGNOSIS — F339 Major depressive disorder, recurrent, unspecified: Secondary | ICD-10-CM

## 2019-09-08 DIAGNOSIS — E039 Hypothyroidism, unspecified: Secondary | ICD-10-CM

## 2019-09-08 DIAGNOSIS — M8000XA Age-related osteoporosis with current pathological fracture, unspecified site, initial encounter for fracture: Secondary | ICD-10-CM

## 2019-09-08 DIAGNOSIS — E785 Hyperlipidemia, unspecified: Secondary | ICD-10-CM

## 2019-09-08 DIAGNOSIS — M159 Polyosteoarthritis, unspecified: Secondary | ICD-10-CM

## 2019-09-08 DIAGNOSIS — N318 Other neuromuscular dysfunction of bladder: Secondary | ICD-10-CM

## 2019-09-08 NOTE — Assessment & Plan Note (Signed)
No recent fxs, continue Alendronate, Ca, Vit D

## 2019-09-08 NOTE — Progress Notes (Signed)
This encounter was created in error - please disregard.

## 2019-09-08 NOTE — Assessment & Plan Note (Signed)
Her mood is stable, continue Venlafaxine

## 2019-09-08 NOTE — Assessment & Plan Note (Signed)
No urinary retention, continue Mirabegron.  

## 2019-09-08 NOTE — Assessment & Plan Note (Signed)
Trended up TSH 1.97 05/18/19, 7.57 09/07/19, will increase Levothyroxine 120mcg qd, update TSH in 12 weeks.

## 2019-09-08 NOTE — Assessment & Plan Note (Signed)
Pain is controlled, continue Tylenol.  

## 2019-09-08 NOTE — Assessment & Plan Note (Signed)
LDL at gaol,

## 2019-10-23 IMAGING — MR MR LUMBAR SPINE W/O CM
5 series · 45 of 48 positions shown · non-contrast
Comparison: Lumbar spine radiographs 04/04/2017.

CLINICAL DATA: Low back pain extending into left hip. Leg pain goes
to the knee. Low back pain extending into the left hip and lower
extremity to the knee. Pain extends along the posterior aspect of
the leg, following a left S1 radicular pattern.

EXAM:
MRI LUMBAR SPINE WITHOUT CONTRAST
TECHNIQUE: Multiplanar, multisequence MR imaging of the lumbar spine was
performed. No intravenous contrast was administered.

[Series 3: tirm sag · sagittal · 4.0mm · 0.55mm/px · 6 of 12 slices shown]
[im 1/12]
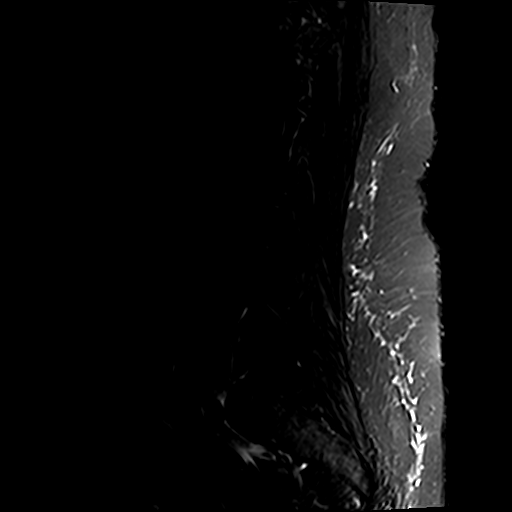
[im 3/12]
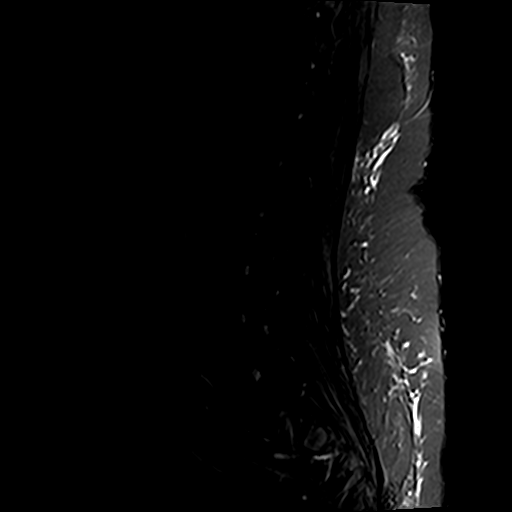
[im 5/12]
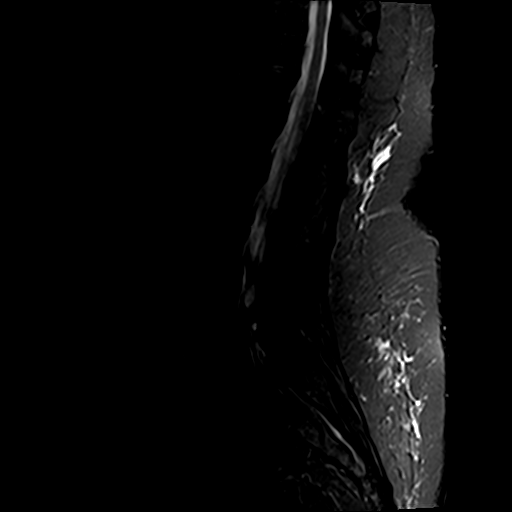
[im 7/12]
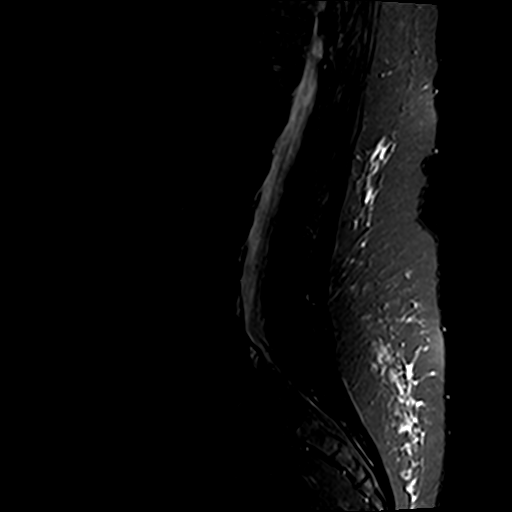
[im 9/12]
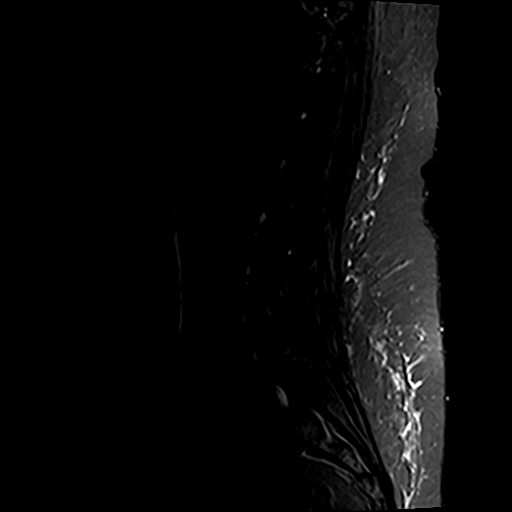
[im 12/12]
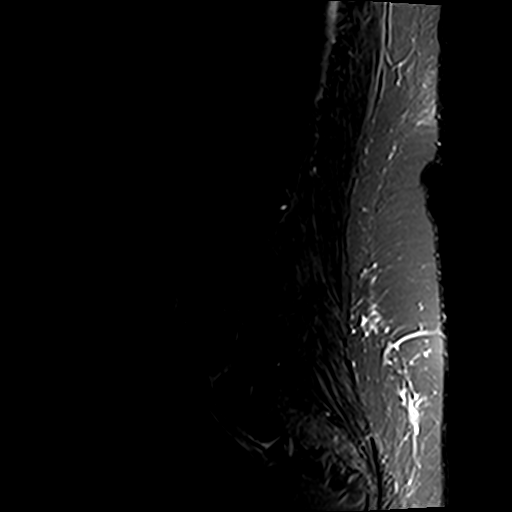

[Series 4: T1 · sagittal · 4.0mm · 0.88mm/px · 5 of 12 slices shown (1 of 2)]
[im 1/12]
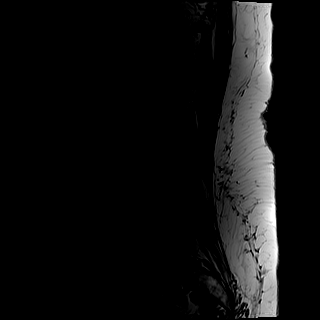
[im 3/12]
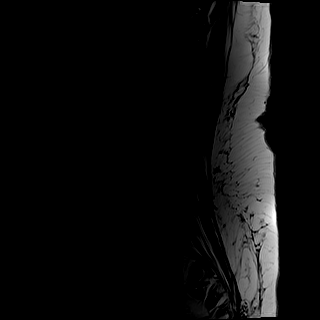
[im 6/12]
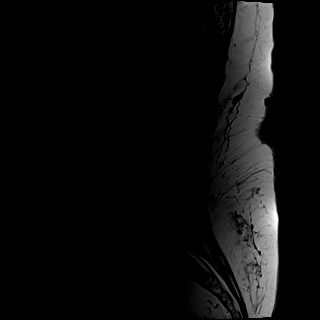
[im 9/12]
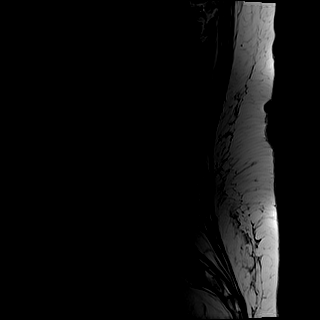
[im 12/12]
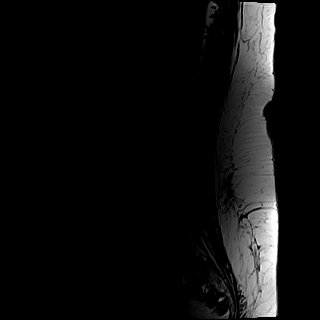

[Series 5: T2 post-contrast · sagittal · 4.0mm · 0.88mm/px · 5 of 12 slices shown]
[im 1/12]
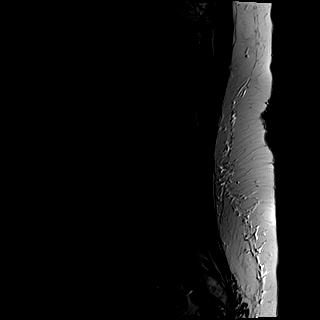
[im 3/12]
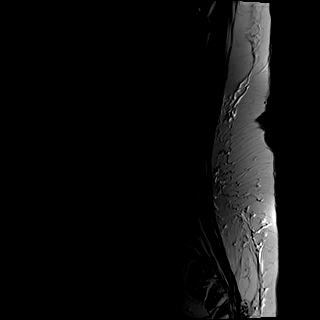
[im 6/12]
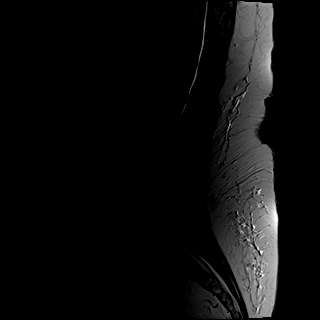
[im 9/12]
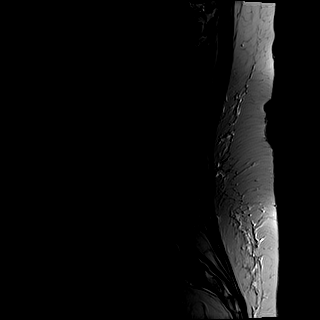
[im 12/12]
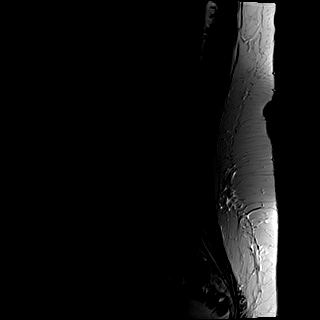

[Series 6: T1 · axial · 4.0mm · 0.78mm/px · z∈[-43,+190]mm · 13 of 36 slices shown (2 of 2)]
[im 1/36]
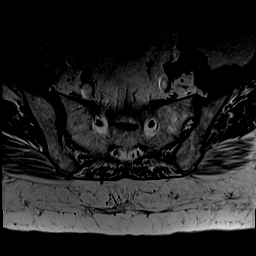
[im 3/36]
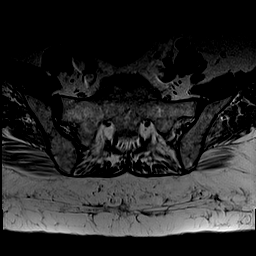
[im 5/36]
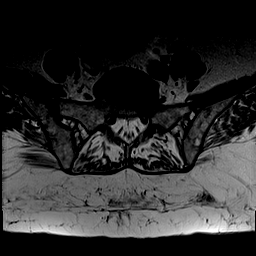
[im 8/36]
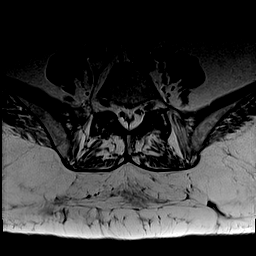
[im 10/36]
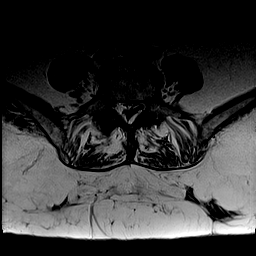
[im 12/36]
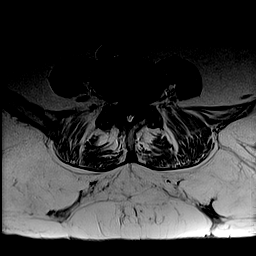
[im 15/36]
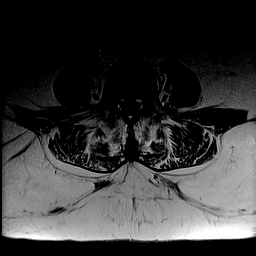
[im 17/36]
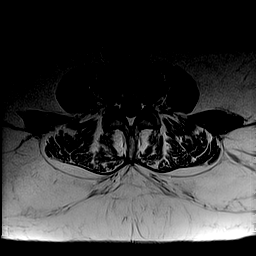
[im 19/36]
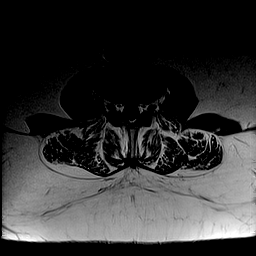
[im 22/36]
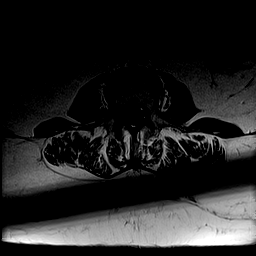
[im 26/36]
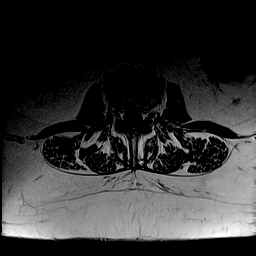
[im 31/36]
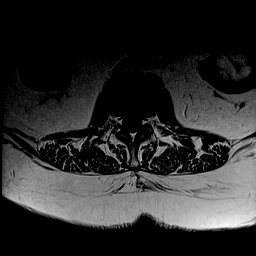
[im 36/36]
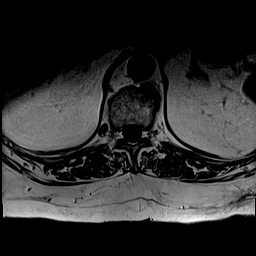

[Series 7: T2 · axial · 4.0mm · 0.78mm/px · z∈[-43,+190]mm · 16 of 36 slices shown]
[im 1/36]
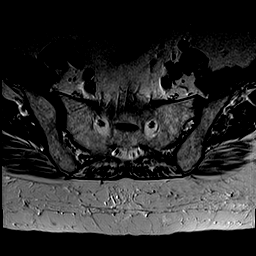
[im 3/36]
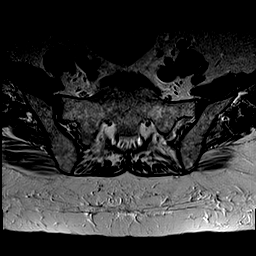
[im 5/36]
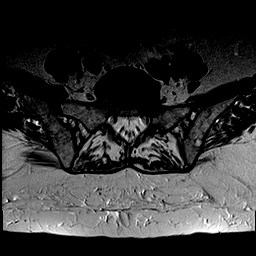
[im 8/36]
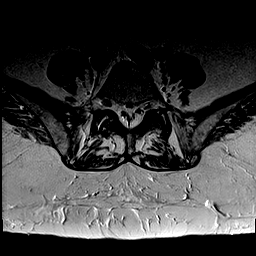
[im 10/36]
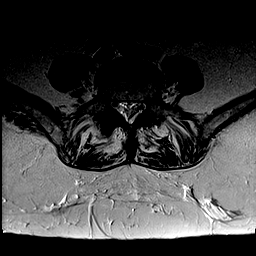
[im 12/36]
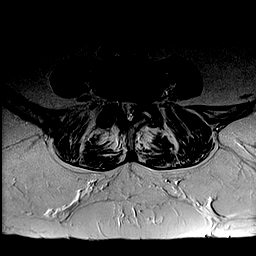
[im 15/36]
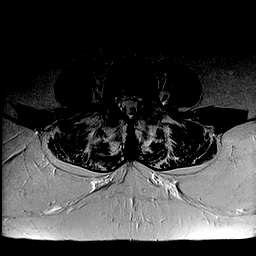
[im 17/36]
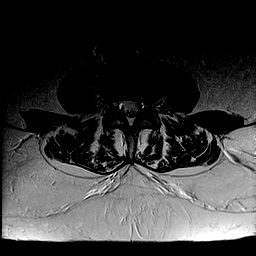
[im 19/36]
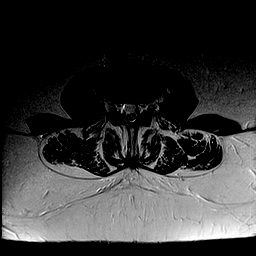
[im 22/36]
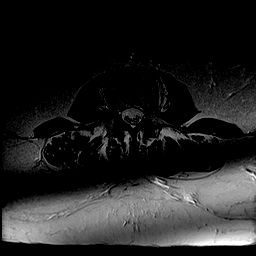
[im 24/36]
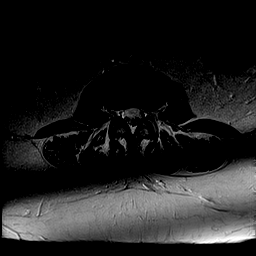
[im 26/36]
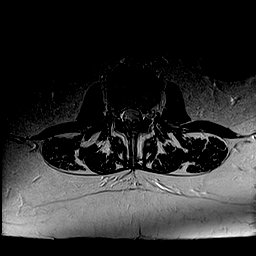
[im 29/36]
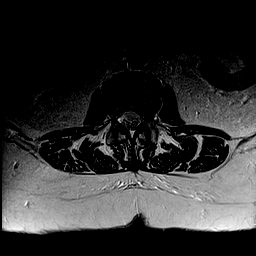
[im 31/36]
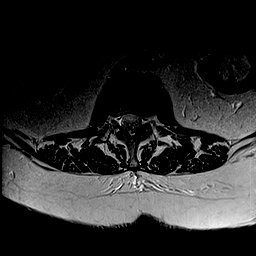
[im 33/36]
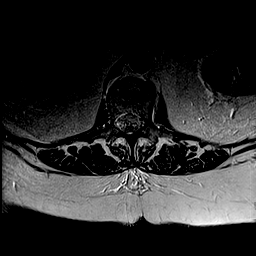
[im 36/36]
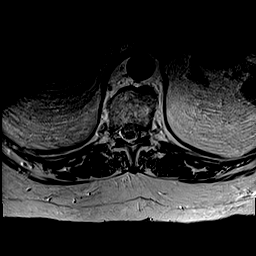

[45 of 48 positions shown; findings below may reference images not displayed]

FINDINGS: Segmentation: 5 non rib-bearing lumbar type vertebral bodies are
present.

Alignment:  AP alignment is anatomic.

Vertebrae:  Marrow signal and vertebral body heights are normal.

Conus medullaris and cauda equina: Conus extends to the L1-2 level.
Conus and cauda equina appear normal.

Paraspinal and other soft tissues: Limited imaging of the abdomen is
unremarkable. There is no significant adenopathy.

Disc levels:

L1-2: Negative.

L2-3: Negative.

L3-4: Negative.

L4-5: Moderate facet hypertrophy is present bilaterally. A
left-sided synovial cyst and leftward disc protrusion contribute to
moderate left subarticular and foraminal stenosis. Mild right
foraminal narrowing is present.

L5-S1. Prominent epidural fat is present. Mild facet hypertrophy is
noted. There is no focal stenosis.
IMPRESSION: 1. Moderate left subarticular and foraminal stenosis at L4-5
secondary to a leftward disc protrusion and asymmetric left-sided
facet hypertrophy with a synovial cyst likely impacts the left L4
and L5 nerve roots.
2. Mild right foraminal narrowing at L4-5.
3. Epidural lipomatosis at L5-S1.

## 2019-10-26 ENCOUNTER — Non-Acute Institutional Stay: Payer: Medicare Other | Admitting: Nurse Practitioner

## 2019-10-26 ENCOUNTER — Encounter: Payer: Self-pay | Admitting: Nurse Practitioner

## 2019-10-26 DIAGNOSIS — M159 Polyosteoarthritis, unspecified: Secondary | ICD-10-CM

## 2019-10-26 DIAGNOSIS — I10 Essential (primary) hypertension: Secondary | ICD-10-CM

## 2019-10-26 DIAGNOSIS — K219 Gastro-esophageal reflux disease without esophagitis: Secondary | ICD-10-CM

## 2019-10-26 DIAGNOSIS — M8000XA Age-related osteoporosis with current pathological fracture, unspecified site, initial encounter for fracture: Secondary | ICD-10-CM

## 2019-10-26 DIAGNOSIS — F339 Major depressive disorder, recurrent, unspecified: Secondary | ICD-10-CM

## 2019-10-26 DIAGNOSIS — N318 Other neuromuscular dysfunction of bladder: Secondary | ICD-10-CM

## 2019-10-26 DIAGNOSIS — E039 Hypothyroidism, unspecified: Secondary | ICD-10-CM

## 2019-10-26 DIAGNOSIS — I2510 Atherosclerotic heart disease of native coronary artery without angina pectoris: Secondary | ICD-10-CM

## 2019-10-26 DIAGNOSIS — D519 Vitamin B12 deficiency anemia, unspecified: Secondary | ICD-10-CM

## 2019-10-26 DIAGNOSIS — M353 Polymyalgia rheumatica: Secondary | ICD-10-CM

## 2019-10-26 DIAGNOSIS — F5101 Primary insomnia: Secondary | ICD-10-CM

## 2019-10-26 LAB — FOLATE: Folate: 816

## 2019-10-26 NOTE — Progress Notes (Signed)
Location:   [place] Room Number: [place] of Service:  ALF (13) Provider:  Marlana Latus NP  Kemya Shed X, NP  Patient Care Team: Persephonie Hegwood X, NP as PCP - General (Internal Medicine) Jettie Booze, MD as PCP - Cardiology (Cardiology) Wilford Corner, MD as Consulting Physician (Gastroenterology) Marlou Sa, Tonna Corner, MD as Consulting Physician (Orthopedic Surgery) Marcial Pacas, MD as Consulting Physician (Neurology) Franchot Gallo, MD as Consulting Physician (Urology) Kary Kos, MD as Consulting Physician (Neurosurgery)  Extended Emergency Contact Information Primary Emergency Contact: Merlinda Frederick Address: [address]          [place] [address] [place] Phone: [phone] Mobile Phone: [phone] Relation: Spouse Secondary Emergency Contact: [place] Mobile Phone: [phone] Relation: Son  Code Status:  DNR Goals of care: Advanced Directive information Advanced Directives 10/26/2019  Does Patient Have a Medical Advance Directive? Yes  Type of Advance Directive Out of facility DNR (pink MOST or yellow form);Healthcare Power of Attorney  Does patient want to make changes to medical advance directive? No - Patient declined  Copy of [place] in Chart? -  Would patient like information on creating a medical advance directive? -  Pre-existing out of facility DNR order (yellow form or pink MOST form) Yellow form placed in chart (order not valid for inpatient use);Pink MOST form placed in chart (order not valid for inpatient use)     Chief Complaint  Patient presents with   Medical Management of Chronic Issues    HPI:  Pt is a 79 y.o. female seen today for medical management of chronic diseases.    HTN, on Metoprolol  CAD, on Isosorbide and Ranolazine, Plavix.  Hypothyroidism, on Levothyroxine 6mcg qd.    Urinary frequency, stable, on Myrbetriq.   GERD, stable, on  Pantoprazole.  Her mood is stable, on Venlafaxine 150mg  qd.   OA, takes Tylenol  OP, takes Alendronate, Ca, Vit D  Vit B12 deficiency, takes Vit B12  PMR, takes Prednisone 5mg  qd.     Past Medical History:  Diagnosis Date   Anemia    Arthritis    "fingers, back, shoulders, hips" (04/10/2016)   Chronic diastolic CHF (congestive heart failure) (HCC)    a. normal EF, LV[facility]P [facility] in 6/17 33 >> Lasix started    CKD (chronic kidney disease), stage III    Coronary artery disease    a. s/p CABG 2000 (SVG/ free LIMA Y graft to the diagonal and distal LAD, SVG to the OM 1, and SVG to the PDA) . b. Cath 12/19/2014 90% dSVG to RCA s/p 2 overlapping DES. c. LHC 2016, 2017 no intervention except diuresis needed. d. Low risk nuc 03/2016. e. Cath 12/2017 patent LIMA-LAD, SVG-OM, SVG-PDA but occluded SVG to diag.    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 L by cath note in 2016.   TIA (transient ischemic attack)    Past Surgical History:  Procedure Laterality Date   BREAST BIOPSY Right 1964   CARDIAC CATHETERIZATION  08   patent grafts, no  culprit lesions, EF 65%   CARDIAC CATHETERIZATION N/A 12/19/2014   Procedure: Left Heart Cath and Cors/Grafts Angiography;  Surgeon: Jettie Booze, MD;  Location: [place] [facility];  Service: Cardiovascular;  Laterality: N/A;   CARDIAC CATHETERIZATION N/A 12/19/2014   Procedure: Coronary Stent Intervention;  Surgeon: Jettie Booze, MD;  Location: [place] [facility];  Service: Cardiovascular;  Laterality: N/A;   CARDIAC CATHETERIZATION N/A 01/01/2015   Procedure: Left Heart Cath and Cors/Grafts Angiography;  Surgeon:  Jettie Booze, MD;  Location: [place] [facility];  Service: Cardiovascular;  Laterality: N/A;   CARDIAC CATHETERIZATION N/A 09/20/2015   Procedure: Left Heart Cath and Cors/Grafts Angiography;  Surgeon: Jettie Booze, MD;  Location: [place] [facility];  Service: Cardiovascular;  Laterality: N/A;   CATARACT EXTRACTION W/ INTRAOCULAR LENS  IMPLANT, BILATERAL Bilateral 2011   COLONOSCOPY WITH PROPOFOL N/A 07/27/2016   Procedure: COLONOSCOPY WITH PROPOFOL;  Surgeon: Wilford Corner, MD;  Location: [facility];  Service: Endoscopy;  Laterality: N/A;   CORONARY ARTERY BYPASS GRAFT  2000   ASCVD, multivessel, S./P.   DILATION AND CURETTAGE OF UTERUS  1980s   ESOPHAGOGASTRODUODENOSCOPY (EGD) WITH PROPOFOL N/A 07/27/2016   Procedure: ESOPHAGOGASTRODUODENOSCOPY (EGD) WITH PROPOFOL;  Surgeon: Wilford Corner, MD;  Location: [place];  Service: Endoscopy;  Laterality: N/A;   FRACTURE SURGERY     LEFT HEART CATH AND CORS/GRAFTS ANGIOGRAPHY N/A 01/12/2018   Procedure: LEFT HEART CATH AND CORS/GRAFTS ANGIOGRAPHY;  Surgeon: Jettie Booze, MD;  Location: [place] [facility];  Service: Cardiovascular;  Laterality: N/A;   PATELLA FRACTURE SURGERY Left 1993    Allergies  Allergen Reactions   Nsaids Other (See Comments)    Due to chronic kidney failure   Butorphanol Other (See Comments)    agitation Constipation Produced a lot of urine, made patient feel crazy   Sulfa Antibiotics Rash   Bisoprolol Fumarate Other (See Comments)    Doesn't remember   Butorphanol Tartrate Other (See Comments)    Produced a lot of urine, made patient feel crazy   Codeine Nausea And Vomiting   Demerol [Meperidine] Nausea And Vomiting   Imipramine     Sweating, facial dysfunction    Meperidine And Related Nausea And Vomiting   Statins     MYALGIAS   Tequin [Gatifloxacin] Other (See Comments)    Caused hypoglycemia Low blood sugar   Brilinta [Ticagrelor] Rash    CAUSES  PETECHIAE, PURPURA   Pseudoephedrine Hcl Palpitations   Septra [Sulfamethoxazole-Trimethoprim] Rash   Sulfamethoxazole-Trimethoprim Rash    Allergies as of 10/26/2019      Reactions   Nsaids Other (See Comments)   Due to chronic kidney failure   Butorphanol Other (See Comments)   agitation Constipation Produced a lot of urine, made patient feel crazy   Sulfa Antibiotics Rash   Bisoprolol Fumarate Other (See Comments)   Doesn't remember   Butorphanol Tartrate Other (See Comments)   Produced a lot of urine, made patient feel crazy   Codeine Nausea And Vomiting   Demerol [meperidine] Nausea And Vomiting   Imipramine    Sweating, facial dysfunction    Meperidine And Related Nausea And Vomiting   Statins    MYALGIAS   Tequin [gatifloxacin] Other (See Comments)   Caused hypoglycemia Low blood sugar   Brilinta [ticagrelor] Rash   CAUSES PETECHIAE, PURPURA   Pseudoephedrine Hcl Palpitations   Septra [sulfamethoxazole-trimethoprim] Rash   Sulfamethoxazole-trimethoprim Rash      Medication List  Accurate as of October 26, 2019 11:59 PM. If you have any questions, ask your nurse or doctor.        acetaminophen 325 MG tablet Commonly known as: TYLENOL Take 650 mg by mouth in the morning and at bedtime.   alendronate 70 MG tablet Commonly known as: FOSAMAX Take 70 mg by mouth every Sunday.   alum & mag hydroxide-simeth 294-765-46 MG/5ML suspension Commonly known as: MAALOX PLUS Take 10 mLs by mouth as needed for indigestion.   atorvastatin 10 MG tablet Commonly known as: LIPITOR TAKE 1 TABLET ONCE DAILY.   Biofreeze 10 % Liqd Generic drug: Menthol (Topical Analgesic) Apply topically as needed.   calcium carbonate 600 MG Tabs tablet Commonly known as: OS-CAL Take 600 mg by mouth 2 (two) times daily with a meal.   clopidogrel 75 MG tablet Commonly known as: PLAVIX TAKE 1 TABLET ONCE DAILY.   COENZYME Q-10 PO Take 10 mg by mouth at bedtime.    cyanocobalamin 1000 MCG tablet Take 1,000 mcg by mouth daily.   fluticasone 50 MCG/ACT nasal spray Commonly known as: FLONASE Place 2 sprays into both nostrils daily as needed for allergies.   Glucosamine 1500 Complex Caps Take 3 capsules by mouth daily after breakfast.   isosorbide mononitrate 60 MG 24 hr tablet Commonly known as: IMDUR Take 2 tablets (120 mg total) by mouth daily.   levocetirizine 5 MG tablet Commonly known as: XYZAL TAKE 1 TABLET ONCE DAILY IN THE EVENING.   levothyroxine 75 MCG tablet Commonly known as: SYNTHROID TAKE 1 TABLET ONCE DAILY ON EMPTY STOMACH.   melatonin 1 MG Tabs tablet Take by mouth at bedtime.   metoprolol succinate 25 MG 24 hr tablet Commonly known as: TOPROL-XL Take 25 mg by mouth in the morning and at bedtime. Take with or immediately following a meal.   Myrbetriq 50 MG Tb24 tablet Generic drug: mirabegron ER TAKE 1 TABLET BY MOUTH DAILY.   nitroGLYCERIN 0.4 MG SL tablet Commonly known as: NITROSTAT Place 0.4 mg under the tongue every 5 (five) minutes as needed for chest pain. X 3 doses   nystatin powder Commonly known as: MYCOSTATIN/NYSTOP Apply 1 application topically. apply underneath abd/breast folds As Needed   pantoprazole 40 MG tablet Commonly known as: PROTONIX Take 1 tablet (40 mg total) by mouth daily.   predniSONE 5 MG tablet Commonly known as: DELTASONE Take 5 mg by mouth daily.   PSYLLIUM HUSK PO Take 0.4 g by mouth daily. May keep at bedside   ranolazine 1000 MG SR tablet Commonly known as: RANEXA TAKE 1 TABLET BY MOUTH TWICE DAILY.   venlafaxine 75 MG tablet Commonly known as: EFFEXOR Take 150 mg by mouth. 2 caps= 150 mg; oral  Once A Day   Vitamin D 50 MCG (2000 UT) tablet Take 2,000 Units by mouth daily.   zinc oxide 20 % ointment Apply 1 application topically as needed for irritation.       Review of Systems  Constitutional: Negative for appetite change, fatigue, fever and unexpected  weight change.  HENT: Positive for hearing loss. Negative for congestion and voice change.   Eyes: Negative for visual disturbance.  Respiratory: Positive for shortness of breath. Negative for cough and wheezing.        DOE is chronic  Cardiovascular: Negative for chest pain, palpitations and leg swelling.  Gastrointestinal: Negative for abdominal distention, abdominal pain and constipation.  Genitourinary: Negative for difficulty urinating, dysuria, frequency and urgency.  1-2x/night bathroom trips.   Musculoskeletal: Positive for arthralgias, gait problem and myalgias.  Skin: Negative for color change.  Neurological: Positive for syncope. Negative for dizziness, speech difficulty, weakness and headaches.  Psychiatric/Behavioral: Positive for sleep disturbance. Negative for agitation, behavioral problems and hallucinations. The patient is not nervous/anxious.        Sometimes not sleeping well    Immunization History  Administered Date(s) Administered   HPV Bivalent 11/24/2013, 01/17/2016   Influenza Whole 12/23/2017   Influenza, High Dose Seasonal PF 12/24/2018, 12/24/2018   Influenza-Unspecified 01/10/2013, 12/27/2014, 12/30/2016   Moderna SARS-COVID-2 Vaccination 03/25/2019, 04/22/2019   Pneumococcal Conjugate-13 04/21/2013   Pneumococcal Polysaccharide-23 02/27/1993, 03/23/2009   Tdap 04/20/2012   Zoster 11/10/2010   Pertinent  Health Maintenance Due  Topic Date Due   INFLUENZA VACCINE  10/22/2019   DEXA SCAN  Completed   PNA vac Low Risk Adult  Completed   Fall Risk  02/10/2019 10/06/2018 08/18/2018 01/27/2018 12/07/2017  Falls in the past year? 0 1 1 1  Yes  Number falls in past yr: 0 0 0 1 2 or more  Injury with Fall? - 0 1 0 No   Functional Status Survey:    Vitals:   10/26/19 1053  BP: 126/70  Pulse: 68  Resp: 18  Temp: 98.2 F (36.8 C)  SpO2: 97%  Weight: 189 lb 12.8 oz (86.1 kg)  Height: 4\' 10"  (1.473 m)   Body mass index is 39.67  kg/m. Physical Exam Vitals and nursing note reviewed.  Constitutional:      General: She is not in acute distress.    Appearance: Normal appearance. She is not ill-appearing.  HENT:     Head: Normocephalic and atraumatic.     Mouth/Throat:     Mouth: Mucous membranes are moist.  Eyes:     Extraocular Movements: Extraocular movements intact.     Conjunctiva/sclera: Conjunctivae normal.     Pupils: Pupils are equal, round, and reactive to light.  Cardiovascular:     Rate and Rhythm: Normal rate and regular rhythm.     Heart sounds: No gallop.   Pulmonary:     Breath sounds: No rhonchi or rales.  Abdominal:     General: Bowel sounds are normal.     Palpations: Abdomen is soft.     Tenderness: There is no abdominal tenderness.  Musculoskeletal:     Cervical back: Normal range of motion and neck supple.     Right lower leg: No edema.     Left lower leg: No edema.  Skin:    General: Skin is warm and dry.  Neurological:     General: No focal deficit present.     Mental Status: She is alert and oriented to person, place, and time. Mental status is at baseline.     Motor: No weakness.     Coordination: Coordination normal.     Gait: Gait abnormal.  Psychiatric:        Mood and Affect: Mood normal.        Behavior: Behavior normal.        Thought Content: Thought content normal.        Judgment: Judgment normal.     Labs reviewed: Recent Labs    02/14/19 0000 05/18/19 0000 08/03/19 0000  NA 141 142 141  K 5.0 4.7 4.3  CL 107 108 108  CO2 25* 25* 25*  BUN 22* 28* 24*  CREATININE 1.2* 1.3* 1.3*  CALCIUM 9.0 9.0 8.6*   Recent Labs  02/14/19 0000 05/18/19 0000 08/03/19 0000  AST 10* 7* 8*  ALT 7 5*  --   ALKPHOS 41 39 37  ALBUMIN 3.6 3.7 3.5   Recent Labs    05/18/19 0000 08/03/19 0000 09/06/19 0000  WBC 9.0 10.6 11.0  NEUTROABS 5,814 6,646  --   HGB 10.1* 10.3* 11.4*  HCT 31* 322* 35*  PLT 226 244 256   Lab Results  Component Value Date   TSH 1.97  05/18/2019   Lab Results  Component Value Date   HGBA1C 5.1 02/14/2019   Lab Results  Component Value Date   CHOL 156 08/03/2019   HDL 60 08/03/2019   LDLCALC 74 08/03/2019   TRIG 134 08/03/2019   CHOLHDL 2.1 06/07/2018    Significant Diagnostic Results in last 30 days:  No results found.  Assessment/Plan Essential hypertension Blood pressure is controlled, continue Metoprolol.   CAD (coronary artery disease) Stable, continue Isosorbide, Ranolazine, Plavix.   GERD (gastroesophageal reflux disease) Stable, continue Pantoprazole.   Hypothyroidism Stable, continue Levothyroxine, TSH 1.97 05/18/19  Generalized osteoarthritis of multiple sites Pain is controlled, continue Tylenol   Osteoporosis Stable, continue Alendronate, Ca, Vit D  Hypertonicity of bladder Nocturnal urination 1-2 x/night, continue Myrbetriq  Depression, recurrent (HCC) Her mood is stable, continue Venlafaxine.   Insomnia Occasionally, doesn't need hypnotics.   Polymyalgia rheumatica (HCC) Stable, continue Prednisone.   Vitamin B12 deficiency anemia Supplemented, continue Vit B12, Vit B12 368 09/05/19, Vit B12 was 162 08/03/19.      Family/ staff Communication: plan of care reviewed with the patient and charge nurse.   Labs/tests ordered:  none  Time spend 40 minutes.

## 2019-10-29 ENCOUNTER — Encounter: Payer: Self-pay | Admitting: Nurse Practitioner

## 2019-10-29 NOTE — Assessment & Plan Note (Signed)
Stable, continue Levothyroxine, TSH 1.97 05/18/19

## 2019-10-29 NOTE — Assessment & Plan Note (Signed)
Nocturnal urination 1-2 x/night, continue Myrbetriq

## 2019-10-29 NOTE — Assessment & Plan Note (Signed)
Blood pressure is controlled, continue Metoprolol. 

## 2019-10-29 NOTE — Assessment & Plan Note (Signed)
Stable, continue Pantoprazole.  

## 2019-10-29 NOTE — Assessment & Plan Note (Signed)
Pain is controlled, continue Tylenol.  

## 2019-10-29 NOTE — Assessment & Plan Note (Signed)
Her mood is stable, continue Venlafaxine.

## 2019-10-29 NOTE — Assessment & Plan Note (Signed)
Occasionally, doesn't need hypnotics.

## 2019-10-29 NOTE — Assessment & Plan Note (Signed)
Supplemented, continue Vit B12, Vit B12 368 09/05/19, Vit B12 was 162 08/03/19.

## 2019-10-29 NOTE — Assessment & Plan Note (Signed)
Stable, continue Isosorbide, Ranolazine, Plavix.

## 2019-10-29 NOTE — Assessment & Plan Note (Signed)
Stable, continue Alendronate, Ca, Vit D

## 2019-10-29 NOTE — Assessment & Plan Note (Signed)
Stable, continue Prednisone.

## 2020-02-20 ENCOUNTER — Encounter: Payer: Self-pay | Admitting: Internal Medicine

## 2020-02-20 ENCOUNTER — Non-Acute Institutional Stay: Payer: Medicare Other | Admitting: Internal Medicine

## 2020-02-20 DIAGNOSIS — E785 Hyperlipidemia, unspecified: Secondary | ICD-10-CM

## 2020-02-20 DIAGNOSIS — D519 Vitamin B12 deficiency anemia, unspecified: Secondary | ICD-10-CM

## 2020-02-20 DIAGNOSIS — M8000XA Age-related osteoporosis with current pathological fracture, unspecified site, initial encounter for fracture: Secondary | ICD-10-CM

## 2020-02-20 DIAGNOSIS — G4733 Obstructive sleep apnea (adult) (pediatric): Secondary | ICD-10-CM

## 2020-02-20 DIAGNOSIS — M159 Polyosteoarthritis, unspecified: Secondary | ICD-10-CM

## 2020-02-20 DIAGNOSIS — E039 Hypothyroidism, unspecified: Secondary | ICD-10-CM

## 2020-02-20 DIAGNOSIS — I2510 Atherosclerotic heart disease of native coronary artery without angina pectoris: Secondary | ICD-10-CM

## 2020-02-20 DIAGNOSIS — K219 Gastro-esophageal reflux disease without esophagitis: Secondary | ICD-10-CM | POA: Diagnosis not present

## 2020-02-20 DIAGNOSIS — F5101 Primary insomnia: Secondary | ICD-10-CM

## 2020-02-20 DIAGNOSIS — M353 Polymyalgia rheumatica: Secondary | ICD-10-CM

## 2020-02-20 DIAGNOSIS — N1832 Chronic kidney disease, stage 3b: Secondary | ICD-10-CM

## 2020-02-20 DIAGNOSIS — I10 Essential (primary) hypertension: Secondary | ICD-10-CM

## 2020-02-20 DIAGNOSIS — F339 Major depressive disorder, recurrent, unspecified: Secondary | ICD-10-CM

## 2020-02-20 NOTE — Progress Notes (Signed)
Location:  [facility] Room Number: [place] of Service:  ALF (13)  Provider:   Code Status:  Goals of Care:  Advanced Directives 10/26/2019  Does Patient Have a Medical Advance Directive? Yes  Type of Advance Directive Out of facility DNR (pink MOST or yellow form);Healthcare Power of Attorney  Does patient want to make changes to medical advance directive? No - Patient declined  Copy of [place] in Chart? -  Would patient like information on creating a medical advance directive? -  Pre-existing out of facility DNR order (yellow form or pink MOST form) Yellow form placed in chart (order not valid for inpatient use);Pink MOST form placed in chart (order not valid for inpatient use)     Chief Complaint  Patient presents with  . Medical Management of Chronic Issues    HPI: Patient is a 79 y.o. female seen today for medical management of chronic diseases.    Patient has h/o Obesity with OSA,  Chronic Diastolic CHF, CAD s/p [id number], PMR on chronic Steroids and Hypothyroidism,  Patient continues to be on chronic prednisone for her  PMR.  Previous tapering has failed.  Follows with rheumatology. Coronary artery disease Follows with cardiology stress test in the past has been negative.  No chest pains History of IBS with diarrhea Doing well History of postural hypotension has been doing well recently with no falls Patient has gained some weight No nursing issues She lives with her husband in assisted living  Past Medical History:  Diagnosis Date  . Anemia   . Arthritis    "fingers, back, shoulders, hips" (04/10/2016)  . Chronic diastolic CHF (congestive heart failure) (HCC)    a. normal EF, LV[facility]P [facility] in 6/17 33 >> Lasix started   . CKD (chronic kidney disease), stage III ([place])   . Coronary artery disease    a. s/p CABG 2000 (SVG/ free LIMA Y graft to the diagonal and distal LAD, SVG to the OM 1, and SVG to  the PDA) . b. Cath 12/19/2014 90% dSVG to RCA s/p 2 overlapping DES. c. LHC 2016, 2017 no intervention except diuresis needed. d. Low risk nuc 03/2016. e. Cath 12/2017 patent LIMA-LAD, SVG-OM, SVG-PDA but occluded SVG to diag.   . Depression    with anxious component  . GERD (gastroesophageal reflux disease)   . History of hiatal hernia   . Hyperlipidemia   . Hypothyroidism   . Obesity   . OSA on CPAP   . PMR (polymyalgia rheumatica) (HCC)   . Pneumonia    "2-3 times" (04/10/2016)  . Stable angina (HCC)    microvascular, improved with Ranexa  . Subclavian artery stenosis (HCC)    a. L by cath note in 2016.  Marland Kitchen TIA (transient ischemic attack)     Past Surgical History:  Procedure Laterality Date  . BREAST BIOPSY Right 1964  . CARDIAC CATHETERIZATION  08   patent grafts, no culprit lesions, EF 65%  . CARDIAC CATHETERIZATION N/A 12/19/2014   Procedure: Left Heart Cath and Cors/Grafts Angiography;  Surgeon: Jettie Booze, MD;  Location: [place] [facility];  Service: Cardiovascular;  Laterality: N/A;  . CARDIAC CATHETERIZATION N/A 12/19/2014   Procedure: Coronary Stent Intervention;  Surgeon: Jettie Booze, MD;  Location: [place] [facility];  Service: Cardiovascular;  Laterality: N/A;  . CARDIAC CATHETERIZATION N/A 01/01/2015   Procedure: Left Heart Cath and Cors/Grafts Angiography;  Surgeon: Jettie Booze, MD;  Location: [facility]  INVASIVE CV LAB;  Service: Cardiovascular;  Laterality: N/A;  . CARDIAC CATHETERIZATION N/A 09/20/2015   Procedure: Left Heart Cath and Cors/Grafts Angiography;  Surgeon: Jettie Booze, MD;  Location: [place] [facility] LAB;  Service: Cardiovascular;  Laterality: N/A;  . CATARACT EXTRACTION W/ INTRAOCULAR LENS  IMPLANT, BILATERAL Bilateral 2011  . COLONOSCOPY WITH PROPOFOL N/A 07/27/2016   Procedure: COLONOSCOPY WITH PROPOFOL;  Surgeon: Wilford Corner, MD;  Location: [facility];  Service: Endoscopy;  Laterality: N/A;  . CORONARY ARTERY BYPASS GRAFT   2000   ASCVD, multivessel, S./P.  . DILATION AND CURETTAGE OF UTERUS  1980s  . ESOPHAGOGASTRODUODENOSCOPY (EGD) WITH PROPOFOL N/A 07/27/2016   Procedure: ESOPHAGOGASTRODUODENOSCOPY (EGD) WITH PROPOFOL;  Surgeon: Wilford Corner, MD;  Location: [place];  Service: Endoscopy;  Laterality: N/A;  . FRACTURE SURGERY    . LEFT HEART CATH AND CORS/GRAFTS ANGIOGRAPHY N/A 01/12/2018   Procedure: LEFT HEART CATH AND CORS/GRAFTS ANGIOGRAPHY;  Surgeon: Jettie Booze, MD;  Location: [place] [facility];  Service: Cardiovascular;  Laterality: N/A;  . PATELLA FRACTURE SURGERY Left 1993    Allergies  Allergen Reactions  . Nsaids Other (See Comments)    Due to chronic kidney failure  . Butorphanol Other (See Comments)    agitation Constipation Produced a lot of urine, made patient feel crazy  . Sulfa Antibiotics Rash  . Bisoprolol Fumarate Other (See Comments)    Doesn't remember  . Butorphanol Tartrate Other (See Comments)    Produced a lot of urine, made patient feel crazy  . Codeine Nausea And Vomiting  . Demerol [Meperidine] Nausea And Vomiting  . Imipramine     Sweating, facial dysfunction   . Meperidine And Related Nausea And Vomiting  . Statins     MYALGIAS  . Tequin [Gatifloxacin] Other (See Comments)    Caused hypoglycemia Low blood sugar  . Brilinta [Ticagrelor] Rash    CAUSES PETECHIAE, PURPURA  . Pseudoephedrine Hcl Palpitations  . Septra [Sulfamethoxazole-Trimethoprim] Rash  . Sulfamethoxazole-Trimethoprim Rash    Outpatient Encounter Medications as of 02/20/2020  Medication Sig  . acetaminophen (TYLENOL) 325 MG tablet Take 650 mg by mouth in the morning and at bedtime.  Marland Kitchen alendronate (FOSAMAX) 70 MG tablet Take 70 mg by mouth every Sunday.   Marland Kitchen alum & mag hydroxide-simeth (MAALOX PLUS) 400-400-40 MG/5ML suspension Take 10 mLs by mouth as needed for indigestion.  Marland Kitchen atorvastatin (LIPITOR) 10 MG tablet TAKE 1 TABLET ONCE DAILY.  . calcium carbonate (OS-CAL) 600 MG  TABS tablet Take 600 mg by mouth 2 (two) times daily with a meal.  . Cholecalciferol (VITAMIN D) 50 MCG (2000 UT) tablet Take 2,000 Units by mouth daily.  . clopidogrel (PLAVIX) 75 MG tablet TAKE 1 TABLET ONCE DAILY.  Marland Kitchen COENZYME Q-10 PO Take 10 mg by mouth at bedtime.  . cyanocobalamin 1000 MCG tablet Take 1,000 mcg by mouth daily.  . fluticasone (FLONASE) 50 MCG/ACT nasal spray Place 2 sprays into both nostrils daily as needed for allergies.   . Glucosamine-Chondroit-Vit C-Mn (GLUCOSAMINE 1500 COMPLEX) CAPS Take 3 capsules by mouth daily after breakfast.   . isosorbide mononitrate (IMDUR) 60 MG 24 hr tablet Take 2 tablets (120 mg total) by mouth daily.  Marland Kitchen levocetirizine (XYZAL) 5 MG tablet TAKE 1 TABLET ONCE DAILY IN THE EVENING.  Marland Kitchen levothyroxine (SYNTHROID) 75 MCG tablet TAKE 1 TABLET ONCE DAILY ON EMPTY STOMACH.  . Melatonin 1 MG TABS Take by mouth at bedtime.  . Menthol, Topical Analgesic, (BIOFREEZE) 10 % LIQD Apply  topically as needed.  . metoprolol succinate (TOPROL-XL) 25 MG 24 hr tablet Take 25 mg by mouth in the morning and at bedtime. Take with or immediately following a meal.   . MYRBETRIQ 50 MG TB24 tablet TAKE 1 TABLET BY MOUTH DAILY.  . nitroGLYCERIN (NITROSTAT) 0.4 MG SL tablet Place 0.4 mg under the tongue every 5 (five) minutes as needed for chest pain. X 3 doses  . nystatin (MYCOSTATIN/NYSTOP) powder Apply 1 application topically. apply underneath abd/breast folds As Needed  . pantoprazole (PROTONIX) 40 MG tablet Take 1 tablet (40 mg total) by mouth daily.  . predniSONE (DELTASONE) 5 MG tablet Take 5 mg by mouth daily.   . PSYLLIUM HUSK PO Take 0.4 g by mouth daily. May keep at bedside  . ranolazine (RANEXA) 1000 MG SR tablet TAKE 1 TABLET BY MOUTH TWICE DAILY.  Marland Kitchen venlafaxine (EFFEXOR) 75 MG tablet Take 150 mg by mouth. 2 caps= 150 mg; oral  Once A Day  . zinc oxide 20 % ointment Apply 1 application topically as needed for irritation.   No facility-administered encounter  medications on file as of 02/20/2020.    Review of Systems:  Review of Systems  Constitutional: Negative.   HENT: Negative.   Respiratory: Negative.   Cardiovascular: Positive for leg swelling.  Gastrointestinal: Positive for constipation and diarrhea.  Genitourinary: Negative.   Musculoskeletal: Positive for gait problem.  Skin: Negative.   Neurological: Negative for dizziness.  Psychiatric/Behavioral: Negative.     Health Maintenance  Topic Date Due  . TETANUS/TDAP  04/20/2022  . INFLUENZA VACCINE  Completed  . DEXA SCAN  Completed  . COVID-19 Vaccine  Completed  . PNA vac Low Risk Adult  Completed    Physical Exam: Vitals:   02/20/20 1523  BP: 132/60  Pulse: 74  Resp: 19  Temp: (!) 97.4 F (36.3 C)  SpO2: 93%  Weight: 192 lb 12.8 oz (87.5 kg)  Height: 4\' 10"  (1.473 m)   Body mass index is 40.3 kg/m. Physical Exam Vitals reviewed.  Constitutional:      Appearance: She is obese.  HENT:     Head: Normocephalic.     Nose: Nose normal.     Mouth/Throat:     Mouth: Mucous membranes are moist.     Pharynx: Oropharynx is clear.  Eyes:     Pupils: Pupils are equal, round, and reactive to light.  Cardiovascular:     Rate and Rhythm: Normal rate and regular rhythm.  Pulmonary:     Effort: Pulmonary effort is normal.     Breath sounds: Normal breath sounds.  Abdominal:     General: Abdomen is flat. Bowel sounds are normal.     Palpations: Abdomen is soft.  Musculoskeletal:        General: Swelling present.     Cervical back: Neck supple.  Skin:    General: Skin is warm.  Neurological:     General: No focal deficit present.     Mental Status: She is alert and oriented to person, place, and time.  Psychiatric:        Mood and Affect: Mood normal.        Thought Content: Thought content normal.     Labs reviewed: Basic Metabolic Panel: Recent Labs    05/18/19 0000 08/03/19 0000  NA 142 141  K 4.7 4.3  CL 108 108  CO2 25* 25*  BUN 28* 24*    CREATININE 1.3* 1.3*  CALCIUM 9.0 8.6*  TSH 1.97  --  Liver Function Tests: Recent Labs    05/18/19 0000 08/03/19 0000  AST 7* 8*  ALT 5*  --   ALKPHOS 39 37  ALBUMIN 3.7 3.5   No results for input(s): LIPASE, AMYLASE in the last 8760 hours. No results for input(s): AMMONIA in the last 8760 hours. CBC: Recent Labs    05/18/19 0000 08/03/19 0000 09/06/19 0000  WBC 9.0 10.6 11.0  NEUTROABS 5,814 6,646  --   HGB 10.1* 10.3* 11.4*  HCT 31* 322* 35*  PLT 226 244 256   Lipid Panel: Recent Labs    08/03/19 0000  CHOL 156  HDL 60  LDLCALC 74  TRIG 134   Lab Results  Component Value Date   HGBA1C 5.1 02/14/2019    Procedures since last visit: No results found.  Assessment/Plan 1. Essential hypertension Doing well Loose control of BP due to Falls and Postural Hypotension  2. Coronary artery disease involving native coronary artery of native heart without angina pectoris On Beta Blocker , Plavix and Lipitor Repeat Lipid panel On statin and renaxa  3. Gastroesophageal reflux disease, unspecified whether esophagitis present Continue Protonix  4. Hypothyroidism, unspecified type Repeat TSH  5. Generalized osteoarthritis of multiple sites Stable  6. Age-related osteoporosis with current pathological fracture, initial encounter on Fosamax Need follow up with her rheumatology for repeat DEXA and Duration of Therapy  7. Primary insomnia On Melatonin 8. Hyperlipidemia LDL goal <70  On Statin Repeat Lipid panel  9. Polymyalgia rheumatica (HCC) On Prednisone Has not tolerated lower doses of Prednisone Will need follow up with Rheumatology  10. Anemia due to vitamin B12 deficiency, unspecified B12 deficiency type Repeat CBC  11. Depression, recurrent ([place])  Doing well on Effexor  12. Obstructive sleep apnea syndrome  Tolerating her CPAP  13 CKD Repeat BMP.   Labs/tests ordered: CBC,CMP , TSH and Lipid panle  Total time spent in this patient  care encounter was  45_  minutes; greater than 50% of the visit spent counseling patient and staff, reviewing records , Labs and coordinating care for problems addressed at this encounter.

## 2020-02-23 LAB — BASIC METABOLIC PANEL
BUN: 26 — AB (ref 4–21)
CO2: 23 — AB (ref 13–22)
Chloride: 106 (ref 99–108)
Creatinine: 1.3 — AB (ref 0.5–1.1)
Glucose: 108
Potassium: 4.6 (ref 3.4–5.3)
Sodium: 142 (ref 137–147)

## 2020-02-23 LAB — LIPID PANEL
Cholesterol: 172 (ref 0–200)
HDL: 72 — AB (ref 35–70)
LDL Cholesterol: 75
LDl/HDL Ratio: 2.4
Triglycerides: 151 (ref 40–160)

## 2020-02-23 LAB — HEPATIC FUNCTION PANEL
ALT: 6 — AB (ref 7–35)
AST: 10 — AB (ref 13–35)
Alkaline Phosphatase: 42 (ref 25–125)
Bilirubin, Total: 0.4

## 2020-02-23 LAB — CBC AND DIFFERENTIAL
HCT: 34 — AB (ref 36–46)
Hemoglobin: 10.9 — AB (ref 12.0–16.0)
Neutrophils Absolute: 6693
Platelets: 256 (ref 150–399)
WBC: 9.8

## 2020-02-23 LAB — COMPREHENSIVE METABOLIC PANEL
Albumin: 3.8 (ref 3.5–5.0)
Calcium: 8.5 — AB (ref 8.7–10.7)
GFR calc Af Amer: 44
GFR calc non Af Amer: 38
Globulin: 2.1

## 2020-02-23 LAB — CBC: RBC: 3.46 — AB (ref 3.87–5.11)

## 2020-02-23 LAB — TSH: TSH: 2.91 (ref 0.41–5.90)

## 2020-03-11 ENCOUNTER — Non-Acute Institutional Stay: Payer: Medicare Other | Admitting: Nurse Practitioner

## 2020-03-11 ENCOUNTER — Encounter: Payer: Self-pay | Admitting: Nurse Practitioner

## 2020-03-11 DIAGNOSIS — G4733 Obstructive sleep apnea (adult) (pediatric): Secondary | ICD-10-CM

## 2020-03-11 DIAGNOSIS — R350 Frequency of micturition: Secondary | ICD-10-CM

## 2020-03-11 DIAGNOSIS — D519 Vitamin B12 deficiency anemia, unspecified: Secondary | ICD-10-CM

## 2020-03-11 DIAGNOSIS — M8000XA Age-related osteoporosis with current pathological fracture, unspecified site, initial encounter for fracture: Secondary | ICD-10-CM

## 2020-03-11 DIAGNOSIS — L2081 Atopic neurodermatitis: Secondary | ICD-10-CM

## 2020-03-11 DIAGNOSIS — I10 Essential (primary) hypertension: Secondary | ICD-10-CM | POA: Diagnosis not present

## 2020-03-11 DIAGNOSIS — K219 Gastro-esophageal reflux disease without esophagitis: Secondary | ICD-10-CM

## 2020-03-11 DIAGNOSIS — I2510 Atherosclerotic heart disease of native coronary artery without angina pectoris: Secondary | ICD-10-CM | POA: Diagnosis not present

## 2020-03-11 DIAGNOSIS — S51011A Laceration without foreign body of right elbow, initial encounter: Secondary | ICD-10-CM | POA: Diagnosis not present

## 2020-03-11 DIAGNOSIS — F339 Major depressive disorder, recurrent, unspecified: Secondary | ICD-10-CM

## 2020-03-11 DIAGNOSIS — L209 Atopic dermatitis, unspecified: Secondary | ICD-10-CM | POA: Insufficient documentation

## 2020-03-11 DIAGNOSIS — M353 Polymyalgia rheumatica: Secondary | ICD-10-CM

## 2020-03-11 DIAGNOSIS — E039 Hypothyroidism, unspecified: Secondary | ICD-10-CM

## 2020-03-11 DIAGNOSIS — M159 Polyosteoarthritis, unspecified: Secondary | ICD-10-CM

## 2020-03-11 NOTE — Assessment & Plan Note (Signed)
CAD, on Isosorbide and Ranolazine, Plavix.

## 2020-03-11 NOTE — Assessment & Plan Note (Signed)
Dorsum of the right hand, well approximated with steri strips, no s/s of infection, will self exfoliating.

## 2020-03-11 NOTE — Assessment & Plan Note (Signed)
HTN, on Metoprolol, Bun/creat 26/1.3 02/23/20

## 2020-03-11 NOTE — Assessment & Plan Note (Signed)
To dressing or triple antibiotic ointment on the dorsum of the right hand. Use soap and water to cleanse the area, then apply 1% hydrocortisone cream daily, ope to air. Observe.

## 2020-03-11 NOTE — Assessment & Plan Note (Signed)
PMR, takes Prednisone 5mg  qd.

## 2020-03-11 NOTE — Assessment & Plan Note (Signed)
OA, takes Tylenol

## 2020-03-11 NOTE — Assessment & Plan Note (Signed)
Urinary frequency, stable, on Myrbetriq.

## 2020-03-11 NOTE — Progress Notes (Signed)
Location:    [facility] Number: [place] of Service:  ALF (13) Provider: Marlana Latus NP  Kenslie Abbruzzese X, NP  Patient Care Team: Janett Kamath X, NP as PCP - General (Internal Medicine) Jettie Booze, MD as PCP - Cardiology (Cardiology) Wilford Corner, MD as Consulting Physician (Gastroenterology) Marlou Sa, Tonna Corner, MD as Consulting Physician (Orthopedic Surgery) Marcial Pacas, MD as Consulting Physician (Neurology) Franchot Gallo, MD as Consulting Physician (Urology) Kary Kos, MD as Consulting Physician (Neurosurgery)  Extended Emergency Contact Information Primary Emergency Contact: Merlinda Frederick Address: [address]          [place] [address] [place] Phone: [phone] Mobile Phone: [phone] Relation: Spouse Secondary Emergency Contact: [place] Mobile Phone: [phone] Relation: Son  Code Status: DNR Goals of care: Advanced Directive information Advanced Directives 10/26/2019  Does Patient Have a Medical Advance Directive? Yes  Type of Advance Directive Out of facility DNR (pink MOST or yellow form);Healthcare Power of Attorney  Does patient want to make changes to medical advance directive? No - Patient declined  Copy of [place] in Chart? -  Would patient like information on creating a medical advance directive? -  Pre-existing out of facility DNR order (yellow form or pink MOST form) Yellow form placed in chart (order not valid for inpatient use);Pink MOST form placed in chart (order not valid for inpatient use)     Chief Complaint  Patient presents with   Acute Visit    Back of right hand skin tear     HPI:  Pt is a 79 y.o. female seen today for an acute visit for dorsum of the right hand skin tear, well approximated with steri strips, and noted redness/vesicles in  rectangle sharp area where the dressing applied with triple antibiotic ointment.   HTN, on  Metoprolol, Bun/creat 26/1.3 02/23/20             CAD, on Isosorbide and Ranolazine, Plavix.             Hypothyroidism, on Levothyroxine 17mcg qd. TSH 2.91 02/23/20             Urinary frequency, stable, on Myrbetriq.              GERD, stable, on Pantoprazole.  Hgb 10.9 03/04/20        Her mood is stable, on Venlafaxine 150mg  qd.              OA, takes Tylenol             OP, takes Alendronate, Ca, Vit D             Vit B12 deficiency, takes Vit B12             PMR, takes Prednisone 5mg  qd.  OSA ,uses CPAP   Past Medical History:  Diagnosis Date   Anemia    Arthritis    "fingers, back, shoulders, hips" (04/10/2016)   Chronic diastolic CHF (congestive heart failure) (HCC)    a. normal EF, LVEDP at [facility] in 6/17 33 >> Lasix started    CKD (chronic kidney disease), stage III (HCC)    Coronary artery disease    a. s/p CABG 2000 (SVG/ free LIMA Y graft to the diagonal and distal LAD, SVG to the OM 1, and SVG to the PDA) . b. Cath 12/19/2014 90% dSVG to RCA s/p 2 overlapping DES. c. LHC 2016, 2017 no intervention except diuresis  needed. d. Low risk nuc 03/2016. e. Cath 12/2017 patent LIMA-LAD, SVG-OM, SVG-PDA but occluded SVG to diag.    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 L by cath note in 2016.   TIA (transient ischemic attack)    Past Surgical History:  Procedure Laterality Date   BREAST BIOPSY Right 1964   CARDIAC CATHETERIZATION  08   patent grafts, no culprit lesions, EF 65%   CARDIAC CATHETERIZATION N/A 12/19/2014   Procedure: Left Heart Cath and Cors/Grafts Angiography;  Surgeon: Jettie Booze, MD;  Location: [place] [facility];  Service:  Cardiovascular;  Laterality: N/A;   CARDIAC CATHETERIZATION N/A 12/19/2014   Procedure: Coronary Stent Intervention;  Surgeon: Jettie Booze, MD;  Location: [place] [facility];  Service: Cardiovascular;  Laterality: N/A;   CARDIAC CATHETERIZATION N/A 01/01/2015   Procedure: Left Heart Cath and Cors/Grafts Angiography;  Surgeon: Jettie Booze, MD;  Location: [place] [facility];  Service: Cardiovascular;  Laterality: N/A;   CARDIAC CATHETERIZATION N/A 09/20/2015   Procedure: Left Heart Cath and Cors/Grafts Angiography;  Surgeon: Jettie Booze, MD;  Location: [place] [facility];  Service: Cardiovascular;  Laterality: N/A;   CATARACT EXTRACTION W/ INTRAOCULAR LENS  IMPLANT, BILATERAL Bilateral 2011   COLONOSCOPY WITH PROPOFOL N/A 07/27/2016   Procedure: COLONOSCOPY WITH PROPOFOL;  Surgeon: Wilford Corner, MD;  Location: [facility];  Service: Endoscopy;  Laterality: N/A;   CORONARY ARTERY BYPASS GRAFT  2000   ASCVD, multivessel, S./P.   DILATION AND CURETTAGE OF UTERUS  1980s   ESOPHAGOGASTRODUODENOSCOPY (EGD) WITH PROPOFOL N/A 07/27/2016   Procedure: ESOPHAGOGASTRODUODENOSCOPY (EGD) WITH PROPOFOL;  Surgeon: Wilford Corner, MD;  Location: [place];  Service: Endoscopy;  Laterality: N/A;   FRACTURE SURGERY     LEFT HEART CATH AND CORS/GRAFTS ANGIOGRAPHY N/A 01/12/2018   Procedure: LEFT HEART CATH AND CORS/GRAFTS ANGIOGRAPHY;  Surgeon: Jettie Booze, MD;  Location: [place] [facility];  Service: Cardiovascular;  Laterality: N/A;   PATELLA FRACTURE SURGERY Left 1993    Allergies  Allergen Reactions   Nsaids Other (See Comments)    Due to chronic kidney failure   Butorphanol Other (See Comments)    agitation Constipation Produced a lot of urine, made patient feel crazy   Sulfa Antibiotics Rash   Bisoprolol Fumarate Other (See Comments)    Doesn't remember   Butorphanol Tartrate Other (See Comments)    Produced a lot of urine, made patient feel  crazy   Codeine Nausea And Vomiting   Demerol [Meperidine] Nausea And Vomiting   Imipramine     Sweating, facial dysfunction    Meperidine And Related Nausea And Vomiting   Statins     MYALGIAS   Tequin [Gatifloxacin] Other (See Comments)    Caused hypoglycemia Low blood sugar   Brilinta [Ticagrelor] Rash    CAUSES PETECHIAE, PURPURA   Pseudoephedrine Hcl Palpitations   Septra [Sulfamethoxazole-Trimethoprim] Rash   Sulfamethoxazole-Trimethoprim Rash    Allergies as of 03/11/2020      Reactions   Nsaids Other (See Comments)   Due to chronic kidney failure   Butorphanol  Other (See Comments)   agitation Constipation Produced a lot of urine, made patient feel crazy   Sulfa Antibiotics Rash   Bisoprolol Fumarate Other (See Comments)   Doesn't remember   Butorphanol Tartrate Other (See Comments)   Produced a lot of urine, made patient feel crazy   Codeine Nausea And Vomiting   Demerol [meperidine] Nausea And Vomiting   Imipramine    Sweating, facial dysfunction    Meperidine And Related Nausea And Vomiting   Statins    MYALGIAS   Tequin [gatifloxacin] Other (See Comments)   Caused hypoglycemia Low blood sugar   Brilinta [ticagrelor] Rash   CAUSES PETECHIAE, PURPURA   Pseudoephedrine Hcl Palpitations   Septra [sulfamethoxazole-trimethoprim] Rash   Sulfamethoxazole-trimethoprim Rash      Medication List       Accurate as of March 11, 2020 11:59 PM. If you have any questions, ask your nurse or doctor.        acetaminophen 325 MG tablet Commonly known as: TYLENOL Take 650 mg by mouth in the morning and at bedtime.   alendronate 70 MG tablet Commonly known as: FOSAMAX Take 70 mg by mouth every Sunday.   alum & mag hydroxide-simeth 275-170-01 MG/5ML suspension Commonly known as: MAALOX PLUS Take 10 mLs by mouth as needed for indigestion.   atorvastatin 10 MG tablet Commonly known as: LIPITOR TAKE 1 TABLET ONCE DAILY.   Biofreeze 10 %  Liqd Generic drug: Menthol (Topical Analgesic) Apply topically as needed.   calcium carbonate 600 MG Tabs tablet Commonly known as: OS-CAL Take 600 mg by mouth 2 (two) times daily with a meal.   clopidogrel 75 MG tablet Commonly known as: PLAVIX TAKE 1 TABLET ONCE DAILY.   COENZYME Q-10 PO Take 10 mg by mouth at bedtime.   cyanocobalamin 1000 MCG tablet Take 1,000 mcg by mouth daily.   fluticasone 50 MCG/ACT nasal spray Commonly known as: FLONASE Place 2 sprays into both nostrils daily as needed for allergies.   Glucosamine 1500 Complex Caps Take 3 capsules by mouth daily after breakfast.   isosorbide mononitrate 60 MG 24 hr tablet Commonly known as: IMDUR Take 2 tablets (120 mg total) by mouth daily.   levocetirizine 5 MG tablet Commonly known as: XYZAL TAKE 1 TABLET ONCE DAILY IN THE EVENING.   levothyroxine 75 MCG tablet Commonly known as: SYNTHROID TAKE 1 TABLET ONCE DAILY ON EMPTY STOMACH.   melatonin 1 MG Tabs tablet Take by mouth at bedtime.   metoprolol succinate 25 MG 24 hr tablet Commonly known as: TOPROL-XL Take 25 mg by mouth in the morning and at bedtime. Take with or immediately following a meal.   Myrbetriq 50 MG Tb24 tablet Generic drug: mirabegron ER TAKE 1 TABLET BY MOUTH DAILY.   nitroGLYCERIN 0.4 MG SL tablet Commonly known as: NITROSTAT Place 0.4 mg under the tongue every 5 (five) minutes as needed for chest pain. X 3 doses   nystatin powder Commonly known as: MYCOSTATIN/NYSTOP Apply 1 application topically. apply underneath abd/breast folds As Needed   pantoprazole 40 MG tablet Commonly known as: PROTONIX Take 1 tablet (40 mg total) by mouth daily.   predniSONE 5 MG tablet Commonly known as: DELTASONE Take 5 mg by mouth daily.   PSYLLIUM HUSK PO Take 0.4 g by mouth daily. May keep at bedside   ranolazine 1000 MG SR tablet Commonly known as: RANEXA TAKE 1 TABLET BY MOUTH TWICE DAILY.   venlafaxine 75 MG tablet Commonly  known as: EFFEXOR Take 150 mg  by mouth. 2 caps= 150 mg; oral  Once A Day   Vitamin D 50 MCG (2000 UT) tablet Take 2,000 Units by mouth daily.   zinc oxide 20 % ointment Apply 1 application topically as needed for irritation.       Review of Systems  Constitutional: Negative for appetite change, fatigue and fever.  HENT: Positive for hearing loss. Negative for congestion and voice change.   Eyes: Negative for visual disturbance.  Respiratory: Positive for shortness of breath. Negative for cough and wheezing.        DOE is chronic  Cardiovascular: Negative for chest pain, palpitations and leg swelling.  Gastrointestinal: Negative for abdominal distention, abdominal pain and constipation.  Genitourinary: Negative for difficulty urinating, dysuria, frequency and urgency.       1-2x/night bathroom trips.   Musculoskeletal: Positive for arthralgias, gait problem and myalgias.  Skin: Positive for rash and wound. Negative for color change.  Neurological: Negative for speech difficulty, weakness, light-headedness and headaches.  Psychiatric/Behavioral: Positive for sleep disturbance. Negative for agitation, behavioral problems and hallucinations. The patient is not nervous/anxious.        Sometimes not sleeping well    Immunization History  Administered Date(s) Administered   HPV Bivalent 11/24/2013, 01/17/2016   Influenza Whole 12/23/2017   Influenza, High Dose Seasonal PF 12/24/2018, 12/24/2018   Influenza-Unspecified 01/10/2013, 12/27/2014, 12/30/2016, 01/02/2020   Moderna Sars-Covid-2 Vaccination 03/25/2019, 04/22/2019   Pneumococcal Conjugate-13 04/21/2013   Pneumococcal Polysaccharide-23 02/27/1993, 03/23/2009   Tdap 04/20/2012   Zoster 11/10/2010   Pertinent  Health Maintenance Due  Topic Date Due   INFLUENZA VACCINE  Completed   DEXA SCAN  Completed   PNA vac Low Risk Adult  Completed   Fall Risk  02/10/2019 10/06/2018 08/18/2018 01/27/2018 12/07/2017  Falls in  the past year? 0 1 1 1  Yes  Number falls in past yr: 0 0 0 1 2 or more  Injury with Fall? - 0 1 0 No   Functional Status Survey:    Vitals:   03/11/20 1546  BP: 118/70  Pulse: 68  Resp: 20  Temp: 98.2 F (36.8 C)  SpO2: 98%  Weight: 192 lb 12.8 oz (87.5 kg)  Height: 4\' 10"  (1.473 m)   Body mass index is 40.3 kg/m. Physical Exam Vitals and nursing note reviewed.  Constitutional:      General: She is not in acute distress.    Appearance: Normal appearance. She is not ill-appearing.  HENT:     Head: Normocephalic and atraumatic.     Mouth/Throat:     Mouth: Mucous membranes are moist.  Eyes:     Extraocular Movements: Extraocular movements intact.     Conjunctiva/sclera: Conjunctivae normal.     Pupils: Pupils are equal, round, and reactive to light.  Cardiovascular:     Rate and Rhythm: Normal rate and regular rhythm.     Heart sounds: No gallop.   Pulmonary:     Breath sounds: No rhonchi or rales.  Abdominal:     General: Bowel sounds are normal.     Palpations: Abdomen is soft.     Tenderness: There is no abdominal tenderness.  Musculoskeletal:     Cervical back: Normal range of motion and neck supple.     Right lower leg: No edema.     Left lower leg: No edema.  Skin:    General: Skin is warm and dry.     Findings: Erythema and rash present.     Comments: dorsum of  the right hand skin tear, well approximated with steri strips. Noted redness/vesicles in  rectangle sharp area where the dressing applied with triple antibiotic ointment.    Neurological:     General: No focal deficit present.     Mental Status: She is alert and oriented to person, place, and time. Mental status is at baseline.     Motor: No weakness.     Coordination: Coordination normal.     Gait: Gait abnormal.  Psychiatric:        Mood and Affect: Mood normal.        Behavior: Behavior normal.        Thought Content: Thought content normal.        Judgment: Judgment normal.     Labs  reviewed: Recent Labs    05/18/19 0000 08/03/19 0000 02/23/20 0000  NA 142 141 142  K 4.7 4.3 4.6  CL 108 108 106  CO2 25* 25* 23*  BUN 28* 24* 26*  CREATININE 1.3* 1.3* 1.3*  CALCIUM 9.0 8.6* 8.5*   Recent Labs    05/18/19 0000 08/03/19 0000 02/23/20 0000  AST 7* 8* 10*  ALT 5*  --  6*  ALKPHOS 39 37 42  ALBUMIN 3.7 3.5 3.8   Recent Labs    05/18/19 0000 08/03/19 0000 09/06/19 0000 02/23/20 0000  WBC 9.0 10.6 11.0 9.8  NEUTROABS 5,814 6,646  --  6,693.00  HGB 10.1* 10.3* 11.4* 10.9*  HCT 31* 322* 35* 34*  PLT 226 244 256 256   Lab Results  Component Value Date   TSH 2.91 02/23/2020   Lab Results  Component Value Date   HGBA1C 5.1 02/14/2019   Lab Results  Component Value Date   CHOL 172 02/23/2020   HDL 72 (A) 02/23/2020   LDLCALC 75 02/23/2020   TRIG 151 02/23/2020   CHOLHDL 2.1 06/07/2018    Significant Diagnostic Results in last 30 days:  No results found.  Assessment/Plan: Atopic dermatitis To dressing or triple antibiotic ointment on the dorsum of the right hand. Use soap and water to cleanse the area, then apply 1% hydrocortisone cream daily, ope to air. Observe.   Skin tear of elbow without complication Dorsum of the right hand, well approximated with steri strips, no s/s of infection, will self exfoliating.   Essential hypertension HTN, on Metoprolol, Bun/creat 26/1.3 02/23/20   CAD (coronary artery disease) CAD, on Isosorbide and Ranolazine, Plavix.   Hypothyroidism Hypothyroidism, on Levothyroxine 57mcg qd. TSH 2.91 02/23/20   Urinary frequency Urinary frequency, stable, on Myrbetriq.   GERD (gastroesophageal reflux disease) GERD, stable, on Pantoprazole.  Hgb 10.9 03/04/20   Depression, recurrent (HCC) Her mood is stable, on Venlafaxine 150mg  qd.   Generalized osteoarthritis of multiple sites OA, takes Tylenol   Osteoporosis OP, takes Alendronate, Ca, Vit D  Vitamin B12 deficiency anemia Vit B12 deficiency, takes  Vit B12   Polymyalgia rheumatica (HCC) PMR, takes Prednisone 5mg  qd.   Sleep apnea OSA ,uses CPAP     Family/ staff Communication: plan fo care reviewed with the patient and charge nurse.   Labs/tests ordered:  None  Time spend 40 minutes.

## 2020-03-11 NOTE — Assessment & Plan Note (Signed)
GERD, stable, on Pantoprazole.  Hgb 10.9 03/04/20

## 2020-03-11 NOTE — Assessment & Plan Note (Signed)
Hypothyroidism, on Levothyroxine 16mcg qd. TSH 2.91 02/23/20

## 2020-03-11 NOTE — Assessment & Plan Note (Signed)
Vit B12 deficiency, takes Vit B12

## 2020-03-11 NOTE — Assessment & Plan Note (Signed)
OSA ,uses CPAP

## 2020-03-11 NOTE — Assessment & Plan Note (Signed)
Her mood is stable, on Venlafaxine 150mg  qd.

## 2020-03-11 NOTE — Assessment & Plan Note (Signed)
OP, takes Alendronate, Ca, Vit D

## 2020-03-12 ENCOUNTER — Encounter: Payer: Self-pay | Admitting: Nurse Practitioner

## 2020-03-22 LAB — BASIC METABOLIC PANEL
BUN: 20 (ref 4–21)
CO2: 29 — AB (ref 13–22)
Chloride: 105 (ref 99–108)
Creatinine: 1 (ref 0.5–1.1)
Glucose: 90
Potassium: 3.9 (ref 3.4–5.3)
Sodium: 141 (ref 137–147)

## 2020-03-22 LAB — CBC AND DIFFERENTIAL
HCT: 40 (ref 36–46)
Hemoglobin: 13.4 (ref 12.0–16.0)
Neutrophils Absolute: 3545
Platelets: 170 (ref 150–399)
WBC: 5.1

## 2020-03-22 LAB — CBC: RBC: 4.38 (ref 3.87–5.11)

## 2020-03-22 LAB — COMPREHENSIVE METABOLIC PANEL
Albumin: 3.9 (ref 3.5–5.0)
Calcium: 8.7 (ref 8.7–10.7)
Globulin: 2.1

## 2020-03-22 LAB — HEPATIC FUNCTION PANEL
ALT: 15 (ref 7–35)
AST: 17 (ref 13–35)
Alkaline Phosphatase: 86 (ref 25–125)
Bilirubin, Total: 0.6

## 2020-04-29 ENCOUNTER — Non-Acute Institutional Stay: Payer: Medicare Other | Admitting: Nurse Practitioner

## 2020-04-29 ENCOUNTER — Encounter: Payer: Self-pay | Admitting: Nurse Practitioner

## 2020-04-29 DIAGNOSIS — M159 Polyosteoarthritis, unspecified: Secondary | ICD-10-CM | POA: Diagnosis not present

## 2020-04-29 DIAGNOSIS — M8000XA Age-related osteoporosis with current pathological fracture, unspecified site, initial encounter for fracture: Secondary | ICD-10-CM

## 2020-04-29 DIAGNOSIS — I10 Essential (primary) hypertension: Secondary | ICD-10-CM

## 2020-04-29 DIAGNOSIS — K219 Gastro-esophageal reflux disease without esophagitis: Secondary | ICD-10-CM | POA: Diagnosis not present

## 2020-04-29 DIAGNOSIS — E039 Hypothyroidism, unspecified: Secondary | ICD-10-CM

## 2020-04-29 DIAGNOSIS — R350 Frequency of micturition: Secondary | ICD-10-CM

## 2020-04-29 DIAGNOSIS — F339 Major depressive disorder, recurrent, unspecified: Secondary | ICD-10-CM | POA: Diagnosis not present

## 2020-04-29 DIAGNOSIS — D519 Vitamin B12 deficiency anemia, unspecified: Secondary | ICD-10-CM

## 2020-04-29 DIAGNOSIS — I2510 Atherosclerotic heart disease of native coronary artery without angina pectoris: Secondary | ICD-10-CM

## 2020-04-29 DIAGNOSIS — G4733 Obstructive sleep apnea (adult) (pediatric): Secondary | ICD-10-CM

## 2020-04-29 DIAGNOSIS — M353 Polymyalgia rheumatica: Secondary | ICD-10-CM

## 2020-04-29 NOTE — Progress Notes (Signed)
Location:   [facility] Room Number: [place] of Service:  ALF (13) Provider:  Marlana Latus NP  Jolea Dolle X, NP  Patient Care Team: Judea Riches X, NP as PCP - General (Internal Medicine) Jettie Booze, MD as PCP - Cardiology (Cardiology) Wilford Corner, MD as Consulting Physician (Gastroenterology) Marlou Sa, Tonna Corner, MD as Consulting Physician (Orthopedic Surgery) Marcial Pacas, MD as Consulting Physician (Neurology) Franchot Gallo, MD as Consulting Physician (Urology) Kary Kos, MD as Consulting Physician (Neurosurgery)  Extended Emergency Contact Information Primary Emergency Contact: Merlinda Frederick Address: [address]          [place] [address] [place] Phone: [phone] Mobile Phone: [phone] Relation: Spouse Secondary Emergency Contact: [place] Mobile Phone: [phone] Relation: Son  Code Status:  DNR Managed Care Goals of care: Advanced Directive information Advanced Directives 10/26/2019  Does Patient Have a Medical Advance Directive? Yes  Type of Advance Directive Out of facility DNR (pink MOST or yellow form);Healthcare Power of Attorney  Does patient want to make changes to medical advance directive? No - Patient declined  Copy of [place] in Chart? -  Would patient like information on creating a medical advance directive? -  Pre-existing out of facility DNR order (yellow form or pink MOST form) Yellow form placed in chart (order not valid for inpatient use);Pink MOST form placed in chart (order not valid for inpatient use)     Chief Complaint  Patient presents with  . Medical Management of Chronic Issues    HPI:  Pt is a 80 y.o. female seen today for medical management of chronic diseases.    HTN, on Metoprolol, Bun/creat 26/1.3 02/23/20 CAD, on Isosorbide and Ranolazine, Plavix. LDL 75 02/23/20 Hypothyroidism, on Levothyroxine  61mcg qd. TSH 2.91 02/23/20 Urinary frequency, stable, on Myrbetriq.  GERD, stable, on Pantoprazole. Hgb 13.4 03/22/20  Her mood is stable, on Venlafaxine 150mg  qd. TSH 2.91 02/23/20 OA, takes Tylenol OP, takes Alendronate, Ca, Vit D Vit B12 deficiency, takes Vit B12, Vit B12 368 PMR, takes Prednisone 5mg  qd.              OSA ,uses CPAP   Past Medical History:  Diagnosis Date  . Anemia   . Arthritis    "fingers, back, shoulders, hips" (04/10/2016)  . Chronic diastolic CHF (congestive heart failure) (HCC)    a. normal EF, LVEDP at [facility] in 6/17 33 >> Lasix started   . CKD (chronic kidney disease), stage III ([place])   . Coronary artery disease    a. s/p CABG 2000 (SVG/ free LIMA Y graft to the diagonal and distal LAD, SVG to the OM 1, and SVG to the PDA) . b. Cath 12/19/2014 90% dSVG to RCA s/p 2 overlapping DES. c. LHC 2016, 2017 no intervention except diuresis needed. d. Low risk nuc 03/2016. e. Cath 12/2017 patent LIMA-LAD, SVG-OM, SVG-PDA but occluded SVG to diag.   . Depression    with anxious component  . GERD (gastroesophageal reflux disease)   . History of hiatal hernia   . Hyperlipidemia   . Hypothyroidism   . Obesity   . OSA on CPAP   . PMR (polymyalgia rheumatica) (HCC)   . Pneumonia    "2-3 times" (04/10/2016)  . Stable angina (HCC)    microvascular, improved with Ranexa  . Subclavian artery stenosis (HCC)    a. L by cath note in 2016.  [place] Kitchen TIA (transient ischemic attack)  Past Surgical History:  Procedure Laterality Date  . BREAST BIOPSY Right 1964  . CARDIAC CATHETERIZATION  08   patent grafts, no culprit lesions, EF 65%  . CARDIAC CATHETERIZATION N/A 12/19/2014   Procedure: Left Heart Cath and Cors/Grafts Angiography;  Surgeon: Jettie Booze, MD;  Location: [facility];  Service: Cardiovascular;  Laterality: N/A;  . CARDIAC CATHETERIZATION N/A 12/19/2014   Procedure: Coronary Stent  Intervention;  Surgeon: Jettie Booze, MD;  Location: [facility];  Service: Cardiovascular;  Laterality: N/A;  . CARDIAC CATHETERIZATION N/A 01/01/2015   Procedure: Left Heart Cath and Cors/Grafts Angiography;  Surgeon: Jettie Booze, MD;  Location: [facility];  Service: Cardiovascular;  Laterality: N/A;  . CARDIAC CATHETERIZATION N/A 09/20/2015   Procedure: Left Heart Cath and Cors/Grafts Angiography;  Surgeon: Jettie Booze, MD;  Location: [facility];  Service: Cardiovascular;  Laterality: N/A;  . CATARACT EXTRACTION W/ INTRAOCULAR LENS  IMPLANT, BILATERAL Bilateral 2011  . COLONOSCOPY WITH PROPOFOL N/A 07/27/2016   Procedure: COLONOSCOPY WITH PROPOFOL;  Surgeon: Wilford Corner, MD;  Location: [facility];  Service: Endoscopy;  Laterality: N/A;  . CORONARY ARTERY BYPASS GRAFT  2000   ASCVD, multivessel, S./P.  . DILATION AND CURETTAGE OF UTERUS  1980s  . ESOPHAGOGASTRODUODENOSCOPY (EGD) WITH PROPOFOL N/A 07/27/2016   Procedure: ESOPHAGOGASTRODUODENOSCOPY (EGD) WITH PROPOFOL;  Surgeon: Wilford Corner, MD;  Location: [facility];  Service: Endoscopy;  Laterality: N/A;  . FRACTURE SURGERY    . LEFT HEART CATH AND CORS/GRAFTS ANGIOGRAPHY N/A 01/12/2018   Procedure: LEFT HEART CATH AND CORS/GRAFTS ANGIOGRAPHY;  Surgeon: Jettie Booze, MD;  Location: [facility];  Service: Cardiovascular;  Laterality: N/A;  . PATELLA FRACTURE SURGERY Left 1993    Allergies  Allergen Reactions  . Nsaids Other (See Comments)    Due to chronic kidney failure  . Butorphanol Other (See Comments)    agitation Constipation Produced a lot of urine, made patient feel crazy  . Sulfa Antibiotics Rash  . Bisoprolol Fumarate Other (See Comments)    Doesn't remember  . Butorphanol Tartrate Other (See Comments)    Produced a lot of urine, made patient feel crazy  . Codeine Nausea And Vomiting  . Demerol [Meperidine] Nausea And Vomiting  . Imipramine      Sweating, facial dysfunction   . Meperidine And Related Nausea And Vomiting  . Statins     MYALGIAS  . Tequin [Gatifloxacin] Other (See Comments)    Caused hypoglycemia Low blood sugar  . Brilinta [Ticagrelor] Rash    CAUSES PETECHIAE, PURPURA  . Pseudoephedrine Hcl Palpitations  . Septra [Sulfamethoxazole-Trimethoprim] Rash  . Sulfamethoxazole-Trimethoprim Rash    Allergies as of 04/29/2020      Reactions   Nsaids Other (See Comments)   Due to chronic kidney failure   Butorphanol Other (See Comments)   agitation Constipation Produced a lot of urine, made patient feel crazy   Sulfa Antibiotics Rash   Bisoprolol Fumarate Other (See Comments)   Doesn't remember   Butorphanol Tartrate Other (See Comments)   Produced a lot of urine, made patient feel crazy   Codeine Nausea And Vomiting   Demerol [meperidine] Nausea And Vomiting   Imipramine    Sweating, facial dysfunction    Meperidine And Related Nausea And Vomiting   Statins    MYALGIAS   Tequin [gatifloxacin] Other (See Comments)   Caused hypoglycemia Low blood sugar   Brilinta [ticagrelor] Rash   CAUSES PETECHIAE, PURPURA  Pseudoephedrine Hcl Palpitations   Septra [sulfamethoxazole-trimethoprim] Rash   Sulfamethoxazole-trimethoprim Rash      Medication List       Accurate as of April 29, 2020 11:59 PM. If you have any questions, ask your nurse or doctor.        acetaminophen 325 MG tablet Commonly known as: TYLENOL Take 650 mg by mouth in the morning and at bedtime.   alendronate 70 MG tablet Commonly known as: FOSAMAX Take 70 mg by mouth every Sunday.   alum & mag hydroxide-simeth [id number] MG/5ML suspension Commonly known as: MAALOX PLUS Take 10 mLs by mouth as needed for indigestion.   atorvastatin 10 MG tablet Commonly known as: LIPITOR TAKE 1 TABLET ONCE DAILY.   Biofreeze 10 % Liqd Generic drug: Menthol (Topical Analgesic) Apply topically as needed.   calcium carbonate 600 MG Tabs  tablet Commonly known as: OS-CAL Take 600 mg by mouth 2 (two) times daily with a meal.   clopidogrel 75 MG tablet Commonly known as: PLAVIX TAKE 1 TABLET ONCE DAILY.   COENZYME Q-10 PO Take 10 mg by mouth at bedtime.   cyanocobalamin 1000 MCG tablet Take 1,000 mcg by mouth daily.   fluticasone 50 MCG/ACT nasal spray Commonly known as: FLONASE Place 2 sprays into both nostrils daily as needed for allergies.   Glucosamine 1500 Complex Caps Take 3 capsules by mouth daily after breakfast.   isosorbide mononitrate 60 MG 24 hr tablet Commonly known as: IMDUR Take 2 tablets (120 mg total) by mouth daily.   levocetirizine 5 MG tablet Commonly known as: XYZAL TAKE 1 TABLET ONCE DAILY IN THE EVENING.   levothyroxine 75 MCG tablet Commonly known as: SYNTHROID TAKE 1 TABLET ONCE DAILY ON EMPTY STOMACH.   melatonin 1 MG Tabs tablet Take by mouth at bedtime.   metoprolol succinate 25 MG 24 hr tablet Commonly known as: TOPROL-XL Take 25 mg by mouth in the morning and at bedtime. Take with or immediately following a meal.   Myrbetriq 50 MG Tb24 tablet Generic drug: mirabegron ER TAKE 1 TABLET BY MOUTH DAILY.   nitroGLYCERIN 0.4 MG SL tablet Commonly known as: NITROSTAT Place 0.4 mg under the tongue every 5 (five) minutes as needed for chest pain. X 3 doses   nystatin powder Commonly known as: MYCOSTATIN/NYSTOP Apply 1 application topically. apply underneath abd/breast folds As Needed   pantoprazole 40 MG tablet Commonly known as: PROTONIX Take 1 tablet (40 mg total) by mouth daily.   predniSONE 5 MG tablet Commonly known as: DELTASONE Take 5 mg by mouth daily.   PSYLLIUM HUSK PO Take 0.4 g by mouth daily. May keep at bedside   ranolazine 1000 MG SR tablet Commonly known as: RANEXA TAKE 1 TABLET BY MOUTH TWICE DAILY.   venlafaxine 75 MG tablet Commonly known as: EFFEXOR Take 150 mg by mouth. 2 caps= 150 mg; oral  Once A Day   Vitamin D 50 MCG (2000 UT)  tablet Take 2,000 Units by mouth daily.   zinc oxide 20 % ointment Apply 1 application topically as needed for irritation.       Review of Systems  Constitutional: Negative for fatigue, fever and unexpected weight change.  HENT: Positive for hearing loss. Negative for congestion and voice change.   Eyes: Negative for visual disturbance.  Respiratory: Positive for shortness of breath. Negative for cough and wheezing.        DOE is chronic  Cardiovascular: Positive for leg swelling. Negative for chest pain and palpitations.  Gastrointestinal: Negative for abdominal pain and constipation.  Genitourinary: Negative for dysuria, frequency and urgency.       1-2x/night bathroom trips.   Musculoskeletal: Positive for arthralgias, gait problem and myalgias.  Skin: Negative for color change.  Neurological: Negative for speech difficulty, weakness and light-headedness.  Psychiatric/Behavioral: Positive for sleep disturbance. Negative for behavioral problems and hallucinations. The patient is not nervous/anxious.        Sometimes not sleeping well    Immunization History  Administered Date(s) Administered  . HPV Bivalent 11/24/2013, 01/17/2016  . Influenza Whole 12/23/2017  . Influenza, High Dose Seasonal PF 12/24/2018, 12/24/2018  . Influenza-Unspecified 01/10/2013, 12/27/2014, 12/30/2016, 01/02/2020  . Moderna Sars-Covid-2 Vaccination 03/25/2019, 04/22/2019, 01/30/2020  . Pneumococcal Conjugate-13 04/21/2013  . Pneumococcal Polysaccharide-23 02/27/1993, 03/23/2009  . Tdap 04/20/2012  . Zoster 11/10/2010   Pertinent  Health Maintenance Due  Topic Date Due  . INFLUENZA VACCINE  Completed  . DEXA SCAN  Completed  . PNA vac Low Risk Adult  Completed   Fall Risk  02/10/2019 10/06/2018 08/18/2018 01/27/2018 12/07/2017  Falls in the past year? 0 1 1 1  Yes  Number falls in past yr: 0 0 0 1 2 or more  Injury with Fall? - 0 1 0 No   Functional Status Survey:    Vitals:   04/29/20 0924   BP: 128/60  Pulse: 90  Resp: (!) 24  Temp: 98.1 F (36.7 C)  SpO2: 93%  Weight: 194 lb 3.2 oz (88.1 kg)  Height: 4\' 10"  (1.473 m)   Body mass index is 40.59 kg/m. Physical Exam Vitals and nursing note reviewed.  Constitutional:      Appearance: Normal appearance. She is obese.  HENT:     Head: Normocephalic and atraumatic.     Mouth/Throat:     Mouth: Mucous membranes are moist.  Eyes:     Extraocular Movements: Extraocular movements intact.     Conjunctiva/sclera: Conjunctivae normal.     Pupils: Pupils are equal, round, and reactive to light.  Cardiovascular:     Rate and Rhythm: Normal rate and regular rhythm.     Heart sounds: No gallop.   Pulmonary:     Breath sounds: No rhonchi or rales.  Abdominal:     General: Bowel sounds are normal.     Palpations: Abdomen is soft.     Tenderness: There is no abdominal tenderness.  Musculoskeletal:     Cervical back: Normal range of motion and neck supple.     Right lower leg: Edema present.     Left lower leg: Edema present.     Comments: Trace edema BLE comes and goes.   Skin:    General: Skin is warm and dry.  Neurological:     General: No focal deficit present.     Mental Status: She is alert and oriented to person, place, and time. Mental status is at baseline.     Motor: No weakness.     Coordination: Coordination normal.     Gait: Gait abnormal.  Psychiatric:        Mood and Affect: Mood normal.        Behavior: Behavior normal.        Thought Content: Thought content normal.        Judgment: Judgment normal.     Labs reviewed: Recent Labs    08/03/19 0000 02/23/20 0000 03/22/20 0000  NA 141 142 141  K 4.3 4.6 3.9  CL 108 106 105  CO2 25* 23*  29*  BUN 24* 26* 20  CREATININE 1.3* 1.3* 1.0  CALCIUM 8.6* 8.5* 8.7   Recent Labs    05/18/19 0000 08/03/19 0000 02/23/20 0000 03/22/20 0000  AST 7* 8* 10* 17  ALT 5*  --  6* 15  ALKPHOS 39 37 42 86  ALBUMIN 3.7 3.5 3.8 3.9   Recent Labs     08/03/19 0000 09/06/19 0000 02/23/20 0000 03/22/20 0000  WBC 10.6 11.0 9.8 5.1  NEUTROABS 6,646  --  6,693.00 3,545.00  HGB 10.3* 11.4* 10.9* 13.4  HCT 322* 35* 34* 40  PLT 244 256 256 170   Lab Results  Component Value Date   TSH 2.91 02/23/2020   Lab Results  Component Value Date   HGBA1C 5.1 02/14/2019   Lab Results  Component Value Date   CHOL 172 02/23/2020   HDL 72 (A) 02/23/2020   LDLCALC 75 02/23/2020   TRIG 151 02/23/2020   CHOLHDL 2.1 06/07/2018    Significant Diagnostic Results in last 30 days:  No results found.  Assessment/Plan GERD (gastroesophageal reflux disease) stable, on Pantoprazole. Hgb 13.4 03/22/20   Depression, recurrent (HCC) on Venlafaxine 150mg  qd. TSH 2.91 02/23/20   Generalized osteoarthritis of multiple sites takes Tylenol, 11/15/19 X-ray R hip/knee, total hip arthroplasty. 03/11/20 R shoulder inj subacromial bursa.    Osteoporosis takes Alendronate, Ca, Vit D  Vitamin B12 deficiency anemia  takes Vit B12, Vit B12 368 09/05/19  Sleep apnea Uses CPAP  Polymyalgia rheumatica (HCC) takes Prednisone 5mg  qd.   Urinary frequency stable, on Myrbetriq.  Hypothyroidism on Levothyroxine 23mcg qd. TSH 2.91 02/23/20   CAD (coronary artery disease)  on Isosorbide and Ranolazine, Plavix. LDL 75 02/23/20  Essential hypertension  on Metoprolol, Bun/creat 26/1.3 02/23/20     Family/ staff Communication: plan of care reviewed with the patient and charge nurse.   Labs/tests ordered:  none  Time spend 40 minutes.

## 2020-05-01 ENCOUNTER — Encounter: Payer: Self-pay | Admitting: Nurse Practitioner

## 2020-05-01 NOTE — Assessment & Plan Note (Signed)
on Metoprolol, Bun/creat 26/1.3 02/23/20

## 2020-05-01 NOTE — Assessment & Plan Note (Signed)
takes Alendronate, Ca, Vit D

## 2020-05-01 NOTE — Assessment & Plan Note (Signed)
Uses CPAP 

## 2020-05-01 NOTE — Assessment & Plan Note (Signed)
stable, on Myrbetriq.

## 2020-05-01 NOTE — Assessment & Plan Note (Signed)
takes Prednisone 5mg qd. 

## 2020-05-01 NOTE — Assessment & Plan Note (Signed)
on Venlafaxine 150mg  qd. TSH 2.91 02/23/20

## 2020-05-01 NOTE — Assessment & Plan Note (Signed)
on Levothyroxine 30mcg qd. TSH 2.91 02/23/20

## 2020-05-01 NOTE — Assessment & Plan Note (Signed)
on Isosorbide and Ranolazine, Plavix. LDL 75 02/23/20

## 2020-05-01 NOTE — Assessment & Plan Note (Signed)
stable, on Pantoprazole. Hgb 13.4 03/22/20

## 2020-05-01 NOTE — Assessment & Plan Note (Signed)
takes Vit B12, Vit B12 368 09/05/19

## 2020-05-01 NOTE — Assessment & Plan Note (Addendum)
takes Tylenol, 11/15/19 X-ray R hip/knee, total hip arthroplasty. 03/11/20 R shoulder inj subacromial bursa.

## 2020-06-05 ENCOUNTER — Ambulatory Visit: Payer: Medicare Other | Admitting: Adult Health

## 2020-08-22 ENCOUNTER — Encounter: Payer: Self-pay | Admitting: Internal Medicine

## 2020-08-22 ENCOUNTER — Non-Acute Institutional Stay: Payer: Medicare Other | Admitting: Internal Medicine

## 2020-08-22 DIAGNOSIS — M353 Polymyalgia rheumatica: Secondary | ICD-10-CM | POA: Diagnosis not present

## 2020-08-22 DIAGNOSIS — M8000XA Age-related osteoporosis with current pathological fracture, unspecified site, initial encounter for fracture: Secondary | ICD-10-CM

## 2020-08-22 DIAGNOSIS — G4733 Obstructive sleep apnea (adult) (pediatric): Secondary | ICD-10-CM

## 2020-08-22 DIAGNOSIS — R5383 Other fatigue: Secondary | ICD-10-CM

## 2020-08-22 DIAGNOSIS — E039 Hypothyroidism, unspecified: Secondary | ICD-10-CM

## 2020-08-22 DIAGNOSIS — E785 Hyperlipidemia, unspecified: Secondary | ICD-10-CM

## 2020-08-22 DIAGNOSIS — N1832 Chronic kidney disease, stage 3b: Secondary | ICD-10-CM

## 2020-08-22 DIAGNOSIS — F5101 Primary insomnia: Secondary | ICD-10-CM

## 2020-08-22 DIAGNOSIS — I10 Essential (primary) hypertension: Secondary | ICD-10-CM

## 2020-08-22 DIAGNOSIS — I2510 Atherosclerotic heart disease of native coronary artery without angina pectoris: Secondary | ICD-10-CM

## 2020-08-22 DIAGNOSIS — R350 Frequency of micturition: Secondary | ICD-10-CM

## 2020-08-22 NOTE — Progress Notes (Signed)
Location:   [facility] Room Number: [place]:  ALF [phone]) Provider:  Veleta Miners MD  Mast, Man X, NP  Patient Care Team: Mast, Man X, NP as PCP - General (Internal Medicine) Jettie Booze, MD as PCP - Cardiology (Cardiology) Wilford Corner, MD as Consulting Physician (Gastroenterology) Marlou Sa, Tonna Corner, MD as Consulting Physician (Orthopedic Surgery) Marcial Pacas, MD as Consulting Physician (Neurology) Franchot Gallo, MD as Consulting Physician (Urology) Kary Kos, MD as Consulting Physician (Neurosurgery)  Extended Emergency Contact Information Primary Emergency Contact: Merlinda Frederick Address: [address]          [place] [address] [place] Phone: [phone] Mobile Phone: [phone] Relation: Spouse Secondary Emergency Contact: Jrue Mobile Phone: [phone] Relation: Son  Code Status:   DNR Managed Care Goals of care: Advanced Directive information Advanced Directives 08/22/2020  Does Patient Have a Medical Advance Directive? Yes  Type of Advance Directive Living will;Out of facility DNR (pink MOST or yellow form)  Does patient want to make changes to medical advance directive? No - Patient declined  Copy of [place] in Chart? -  Would patient like information on creating a medical advance directive? -  Pre-existing out of facility DNR order (yellow form or pink MOST form) Pink MOST form placed in chart (order not valid for inpatient use);Yellow form placed in chart (order not valid for inpatient use)     Chief Complaint  Patient presents with  . Medical Management of Chronic Issues  . Health Maintenance    Shingrix (Not in [facility])    HPI:  Pt is a 80 y.o. female seen today for medical management of chronic diseases.    Patient has h/o Obesity with OSA,  Chronic Diastolic CHF, CAD s/p [id number], PMR on chronic Steroids and  Hypothyroidism,  Patient continues to be on chronic prednisone for her  PMR.  Previous tapering has failed.  Follows with rheumatology  Today she was not feeling well and Her husband was concerned. She got 2nd Covid booster yesterday Had diarrhea last night Today feels Weak and Tired NO SOB or cough or Chest pain or fever or Dysuria Diarrhea resolved. No Nausea or Abdominal Pain Past Medical History:  Diagnosis Date  . Anemia   . Arthritis    "fingers, back, shoulders, hips" (04/10/2016)  . Chronic diastolic CHF (congestive heart failure) (HCC)    a. normal EF, LVEDP at [facility] in 6/17 33 >> Lasix started   . CKD (chronic kidney disease), stage III ([place])   . Coronary artery disease    a. s/p CABG 2000 (SVG/ free LIMA Y graft to the diagonal and distal LAD, SVG to the OM 1, and SVG to the PDA) . b. Cath 12/19/2014 90% dSVG to RCA s/p 2 overlapping DES. c. LHC 2016, 2017 no intervention except diuresis needed. d. Low risk nuc 03/2016. e. Cath 12/2017 patent LIMA-LAD, SVG-OM, SVG-PDA but occluded SVG to diag.   . Depression    with anxious component  . GERD (gastroesophageal reflux disease)   . History of hiatal hernia   . Hyperlipidemia   . Hypothyroidism   . Obesity   . OSA on CPAP   . PMR (polymyalgia rheumatica) (HCC)   . Pneumonia    "2-3 times" (04/10/2016)  . Stable angina (HCC)    microvascular, improved with Ranexa  . Subclavian artery stenosis (HCC)    a. L by cath note in 2016.  Marland Kitchen TIA (  transient ischemic attack)    Past Surgical History:  Procedure Laterality Date  . BREAST BIOPSY Right 1964  . CARDIAC CATHETERIZATION  08   patent grafts, no culprit lesions, EF 65%  . CARDIAC CATHETERIZATION N/A 12/19/2014   Procedure: Left Heart Cath and Cors/Grafts Angiography;  Surgeon: Jettie Booze, MD;  Location: [place] [facility];  Service: Cardiovascular;  Laterality: N/A;  . CARDIAC CATHETERIZATION N/A 12/19/2014   Procedure: Coronary Stent Intervention;  Surgeon: Jettie Booze, MD;  Location: [facility];  Service: Cardiovascular;  Laterality: N/A;  . CARDIAC CATHETERIZATION N/A 01/01/2015   Procedure: Left Heart Cath and Cors/Grafts Angiography;  Surgeon: Jettie Booze, MD;  Location: [place] [facility];  Service: Cardiovascular;  Laterality: N/A;  . CARDIAC CATHETERIZATION N/A 09/20/2015   Procedure: Left Heart Cath and Cors/Grafts Angiography;  Surgeon: Jettie Booze, MD;  Location: [facility];  Service: Cardiovascular;  Laterality: N/A;  . CATARACT EXTRACTION W/ INTRAOCULAR LENS  IMPLANT, BILATERAL Bilateral 2011  . COLONOSCOPY WITH PROPOFOL N/A 07/27/2016   Procedure: COLONOSCOPY WITH PROPOFOL;  Surgeon: Wilford Corner, MD;  Location: [facility];  Service: Endoscopy;  Laterality: N/A;  . CORONARY ARTERY BYPASS GRAFT  2000   ASCVD, multivessel, S./P.  . DILATION AND CURETTAGE OF UTERUS  1980s  . ESOPHAGOGASTRODUODENOSCOPY (EGD) WITH PROPOFOL N/A 07/27/2016   Procedure: ESOPHAGOGASTRODUODENOSCOPY (EGD) WITH PROPOFOL;  Surgeon: Wilford Corner, MD;  Location: [facility];  Service: Endoscopy;  Laterality: N/A;  . FRACTURE SURGERY    . LEFT HEART CATH AND CORS/GRAFTS ANGIOGRAPHY N/A 01/12/2018   Procedure: LEFT HEART CATH AND CORS/GRAFTS ANGIOGRAPHY;  Surgeon: Jettie Booze, MD;  Location: [facility];  Service: Cardiovascular;  Laterality: N/A;  . PATELLA FRACTURE SURGERY Left 1993    Allergies  Allergen Reactions  . Nsaids Other (See Comments)    Due to chronic kidney failure  . Butorphanol Other (See Comments)    agitation Constipation Produced a lot of urine, made patient feel crazy  . Sulfa Antibiotics Rash  . Bisoprolol Fumarate Other (See Comments)    Doesn't remember  . Butorphanol Tartrate Other (See Comments)    Produced a lot of urine, made patient feel crazy  . Codeine Nausea And Vomiting  . Demerol [Meperidine] Nausea And Vomiting  . Imipramine     Sweating, facial dysfunction   .  Meperidine And Related Nausea And Vomiting  . Statins     MYALGIAS  . Tequin [Gatifloxacin] Other (See Comments)    Caused hypoglycemia Low blood sugar  . Brilinta [Ticagrelor] Rash    CAUSES PETECHIAE, PURPURA  . Pseudoephedrine Hcl Palpitations  . Septra [Sulfamethoxazole-Trimethoprim] Rash  . Sulfamethoxazole-Trimethoprim Rash    Allergies as of 08/22/2020      Reactions   Nsaids Other (See Comments)   Due to chronic kidney failure   Butorphanol Other (See Comments)   agitation Constipation Produced a lot of urine, made patient feel crazy   Sulfa Antibiotics Rash   Bisoprolol Fumarate Other (See Comments)   Doesn't remember   Butorphanol Tartrate Other (See Comments)   Produced a lot of urine, made patient feel crazy   Codeine Nausea And Vomiting   Demerol [meperidine] Nausea And Vomiting   Imipramine    Sweating, facial dysfunction    Meperidine And Related Nausea And Vomiting   Statins    MYALGIAS   Tequin [gatifloxacin] Other (See Comments)   Caused hypoglycemia Low blood sugar   Brilinta [ticagrelor] Rash  CAUSES PETECHIAE, PURPURA   Pseudoephedrine Hcl Palpitations   Septra [sulfamethoxazole-trimethoprim] Rash   Sulfamethoxazole-trimethoprim Rash      Medication List       Accurate as of August 22, 2020  1:37 PM. If you have any questions, ask your nurse or doctor.        acetaminophen 325 MG tablet Commonly known as: TYLENOL Take 650 mg by mouth in the morning and at bedtime.   alendronate 70 MG tablet Commonly known as: FOSAMAX Take 70 mg by mouth every Sunday.   alum & mag hydroxide-simeth 947-654-65 MG/5ML suspension Commonly known as: MAALOX PLUS Take 10 mLs by mouth as needed for indigestion.   atorvastatin 10 MG tablet Commonly known as: LIPITOR TAKE 1 TABLET ONCE DAILY.   Biofreeze 10 % Liqd Generic drug: Menthol (Topical Analgesic) Apply topically as needed.   calcium carbonate 600 MG Tabs tablet Commonly known as: OS-CAL Take 600  mg by mouth 2 (two) times daily with a meal.   clopidogrel 75 MG tablet Commonly known as: PLAVIX TAKE 1 TABLET ONCE DAILY.   COENZYME Q-10 PO Take 10 mg by mouth at bedtime.   cyanocobalamin 1000 MCG tablet Take 1,000 mcg by mouth daily.   fluticasone 50 MCG/ACT nasal spray Commonly known as: FLONASE Place 2 sprays into both nostrils daily as needed for allergies.   Glucosamine 1500 Complex Caps Take 3 capsules by mouth daily after breakfast.   isosorbide mononitrate 60 MG 24 hr tablet Commonly known as: IMDUR Take 2 tablets (120 mg total) by mouth daily.   levocetirizine 5 MG tablet Commonly known as: XYZAL TAKE 1 TABLET ONCE DAILY IN THE EVENING.   levothyroxine 75 MCG tablet Commonly known as: SYNTHROID TAKE 1 TABLET ONCE DAILY ON EMPTY STOMACH.   loperamide 2 MG tablet Commonly known as: IMODIUM A-D Take 2 mg by mouth as needed for diarrhea or loose stools. 4 mg po 1st dose, then 2 mg po after each loose stool X 48 hrs. Not to exceed 16 mg (8 tabs) in 24 hrs. If not effective, n   melatonin 1 MG Tabs tablet Take by mouth at bedtime.   metoprolol succinate 25 MG 24 hr tablet Commonly known as: TOPROL-XL Take 25 mg by mouth in the morning and at bedtime. Take with or immediately following a meal.   Myrbetriq 50 MG Tb24 tablet Generic drug: mirabegron ER TAKE 1 TABLET BY MOUTH DAILY.   nitroGLYCERIN 0.4 MG SL tablet Commonly known as: NITROSTAT Place 0.4 mg under the tongue every 5 (five) minutes as needed for chest pain. X 3 doses   nystatin powder Commonly known as: MYCOSTATIN/NYSTOP Apply 1 application topically. apply underneath abd/breast folds As Needed   pantoprazole 40 MG tablet Commonly known as: PROTONIX Take 1 tablet (40 mg total) by mouth daily.   predniSONE 5 MG tablet Commonly known as: DELTASONE Take 5 mg by mouth daily.   PSYLLIUM HUSK PO Take 0.4 g by mouth daily. May keep at bedside   ranolazine 1000 MG SR tablet Commonly known  as: RANEXA TAKE 1 TABLET BY MOUTH TWICE DAILY.   venlafaxine 75 MG tablet Commonly known as: EFFEXOR Take 150 mg by mouth. 2 caps= 150 mg; oral  Once A Day   Vitamin D 50 MCG (2000 UT) tablet Take 2,000 Units by mouth daily.   zinc oxide 20 % ointment Apply 1 application topically as needed for irritation.       Review of Systems  Constitutional: Positive for activity  change and appetite change.  HENT: Negative.   Respiratory: Negative.   Cardiovascular: Negative.   Gastrointestinal: Positive for diarrhea.  Genitourinary: Negative.   Musculoskeletal: Negative.   Skin: Negative.   Neurological: Positive for weakness.  Psychiatric/Behavioral: Negative.     Immunization History  Administered Date(s) Administered  . HPV Bivalent 11/24/2013, 01/17/2016  . Influenza Whole 12/23/2017  . Influenza, High Dose Seasonal PF 12/24/2018, 12/24/2018  . Influenza-Unspecified 01/10/2013, 12/27/2014, 12/30/2016, 01/02/2020  . Moderna Sars-Covid-2 Vaccination 03/25/2019, 04/22/2019, 01/30/2020  . Pneumococcal Conjugate-13 04/21/2013  . Pneumococcal Polysaccharide-23 02/27/1993, 03/23/2009  . Tdap 04/20/2012  . Zoster, Live 11/10/2010   Pertinent  Health Maintenance Due  Topic Date Due  . INFLUENZA VACCINE  10/21/2020  . DEXA SCAN  Completed  . PNA vac Low Risk Adult  Completed   Fall Risk  02/10/2019 10/06/2018 08/18/2018 01/27/2018 12/07/2017  Falls in the past year? 0 1 1 1  Yes  Number falls in past yr: 0 0 0 1 2 or more  Injury with Fall? - 0 1 0 No   Functional Status Survey:    Vitals:   08/22/20 1326  BP: 118/70  Pulse: 70  Resp: 18  Temp: (!) 96.1 F (35.6 C)  SpO2: 95%  Weight: 196 lb 12.8 oz (89.3 kg)  Height: 4\' 10"  (1.473 m)   Body mass index is 41.13 kg/m. Physical Exam Vitals reviewed.  Constitutional:      Appearance: Normal appearance.  HENT:     Head: Normocephalic.     Nose: Nose normal.     Mouth/Throat:     Mouth: Mucous membranes are moist.      Pharynx: Oropharynx is clear.  Eyes:     Pupils: Pupils are equal, round, and reactive to light.  Cardiovascular:     Rate and Rhythm: Normal rate and regular rhythm.     Pulses: Normal pulses.     Heart sounds: Normal heart sounds.  Pulmonary:     Effort: Pulmonary effort is normal.     Breath sounds: Normal breath sounds.  Abdominal:     General: Abdomen is flat. Bowel sounds are normal.     Palpations: Abdomen is soft.  Musculoskeletal:        General: No swelling.     Cervical back: Neck supple.  Skin:    General: Skin is warm.  Neurological:     General: No focal deficit present.     Mental Status: She is alert and oriented to person, place, and time.  Psychiatric:        Mood and Affect: Mood normal.        Thought Content: Thought content normal.     Labs reviewed: Recent Labs    02/23/20 0000 03/22/20 0000  NA 142 141  K 4.6 3.9  CL 106 105  CO2 23* 29*  BUN 26* 20  CREATININE 1.3* 1.0  CALCIUM 8.5* 8.7   Recent Labs    02/23/20 0000 03/22/20 0000  AST 10* 17  ALT 6* 15  ALKPHOS 42 86  ALBUMIN 3.8 3.9   Recent Labs    09/06/19 0000 02/23/20 0000 03/22/20 0000  WBC 11.0 9.8 5.1  NEUTROABS  --  6,693.00 3,545.00  HGB 11.4* 10.9* 13.4  HCT 35* 34* 40  PLT 256 256 170   Lab Results  Component Value Date   TSH 2.91 02/23/2020   Lab Results  Component Value Date   HGBA1C 5.1 02/14/2019   Lab Results  Component Value Date  CHOL 172 02/23/2020   HDL 72 (A) 02/23/2020   LDLCALC 75 02/23/2020   TRIG 151 02/23/2020   CHOLHDL 2.1 06/07/2018    Significant Diagnostic Results in last 30 days:  No results found.  Assessment/Plan Fatigue Most likely due to Covid Vaccination But do some labs on her as she is due for repeat labs already  Age-related osteoporosis  On Fosamax Needs to follow with her Rheumatology and DEXA scan  Obstructive sleep apnea syndrome Doing well with her CPAP Polymyalgia rheumatica (HCC) On Low dose of  Prednisnoe Follow up neede with Rheumatology Urinary frequency On Myrbetriq Hypothyroidism, unspecified type Repeat TSH Coronary artery disease  On Ranexa, Beta blocker, Plavix and statin Essential hypertension Loose control due to Orthostatics Primary insomnia Continue Melatonin Hyperlipidemia LDL goal <70 Repeat Lipid panel on Lipitor Stage 3b chronic kidney disease ([place]) Creat stable Repeat Labs Depression On Effexor Diarrhea Has h/o IBS On Psyllium husk B12 def On supplement Repeat Levels GERD Cotninue Protonix Family/ staff Communication:   Labs/tests ordered:  CBC,CMP,TSH,Lipdi Panel,B12 level  Total time spent in this patient care encounter was  45_  minutes; greater than 50% of the visit spent counseling patient and staff, reviewing records , Labs and coordinating care for problems addressed at this encounter.

## 2020-08-24 LAB — BASIC METABOLIC PANEL
BUN: 25 — AB (ref 4–21)
CO2: 23 — AB (ref 13–22)
Chloride: 107 (ref 99–108)
Creatinine: 1.2 — AB (ref 0.5–1.1)
Glucose: 84
Potassium: 4.2 (ref 3.4–5.3)
Sodium: 139 (ref 137–147)

## 2020-08-24 LAB — LIPID PANEL
Cholesterol: 151 (ref 0–200)
HDL: 57 (ref 35–70)
LDL Cholesterol: 70
LDl/HDL Ratio: 2.6
Triglycerides: 162 — AB (ref 40–160)

## 2020-08-24 LAB — CBC AND DIFFERENTIAL
HCT: 30 — AB (ref 36–46)
Hemoglobin: 9.9 — AB (ref 12.0–16.0)
Neutrophils Absolute: 7535
Platelets: 245 (ref 150–399)
WBC: 11

## 2020-08-24 LAB — COMPREHENSIVE METABOLIC PANEL
Albumin: 3.4 — AB (ref 3.5–5.0)
Calcium: 8.3 — AB (ref 8.7–10.7)
GFR calc Af Amer: 51
GFR calc non Af Amer: 44
Globulin: 3.4

## 2020-08-24 LAB — CBC: RBC: 3.19 — AB (ref 3.87–5.11)

## 2020-08-24 LAB — HEPATIC FUNCTION PANEL
ALT: 7 (ref 7–35)
AST: 9 — AB (ref 13–35)
Alkaline Phosphatase: 42 (ref 25–125)
Bilirubin, Total: 0.3

## 2020-08-24 LAB — TSH: TSH: 2.1 (ref 0.41–5.90)

## 2020-08-24 LAB — VITAMIN B12: Vitamin B-12: 869

## 2020-08-26 ENCOUNTER — Encounter: Payer: Self-pay | Admitting: Nurse Practitioner

## 2020-08-26 ENCOUNTER — Non-Acute Institutional Stay: Payer: Medicare Other | Admitting: Nurse Practitioner

## 2020-08-26 DIAGNOSIS — N1831 Chronic kidney disease, stage 3a: Secondary | ICD-10-CM | POA: Diagnosis not present

## 2020-08-26 DIAGNOSIS — F339 Major depressive disorder, recurrent, unspecified: Secondary | ICD-10-CM

## 2020-08-26 DIAGNOSIS — D519 Vitamin B12 deficiency anemia, unspecified: Secondary | ICD-10-CM

## 2020-08-26 DIAGNOSIS — M8000XA Age-related osteoporosis with current pathological fracture, unspecified site, initial encounter for fracture: Secondary | ICD-10-CM

## 2020-08-26 DIAGNOSIS — G4733 Obstructive sleep apnea (adult) (pediatric): Secondary | ICD-10-CM

## 2020-08-26 DIAGNOSIS — I10 Essential (primary) hypertension: Secondary | ICD-10-CM

## 2020-08-26 DIAGNOSIS — D631 Anemia in chronic kidney disease: Secondary | ICD-10-CM | POA: Insufficient documentation

## 2020-08-26 DIAGNOSIS — M159 Polyosteoarthritis, unspecified: Secondary | ICD-10-CM

## 2020-08-26 DIAGNOSIS — R350 Frequency of micturition: Secondary | ICD-10-CM

## 2020-08-26 DIAGNOSIS — K219 Gastro-esophageal reflux disease without esophagitis: Secondary | ICD-10-CM

## 2020-08-26 DIAGNOSIS — I2510 Atherosclerotic heart disease of native coronary artery without angina pectoris: Secondary | ICD-10-CM | POA: Diagnosis not present

## 2020-08-26 DIAGNOSIS — E039 Hypothyroidism, unspecified: Secondary | ICD-10-CM

## 2020-08-26 DIAGNOSIS — M353 Polymyalgia rheumatica: Secondary | ICD-10-CM

## 2020-08-26 NOTE — Assessment & Plan Note (Signed)
takes Alendronate, Ca, Vit D

## 2020-08-26 NOTE — Assessment & Plan Note (Signed)
takes Prednisone 5mg  qd.

## 2020-08-26 NOTE — Assessment & Plan Note (Signed)
takes Tylenol    

## 2020-08-26 NOTE — Assessment & Plan Note (Signed)
on Isosorbide and Ranolazine, Plavix. LDL 70 08/24/20 

## 2020-08-26 NOTE — Assessment & Plan Note (Signed)
on Levothyroxine 60mcg qd.TSH 2.10 08/24/20

## 2020-08-26 NOTE — Assessment & Plan Note (Signed)
Her mood is stable, on Venlafaxine 150mg  qd. TSH 2.10 08/24/20

## 2020-08-26 NOTE — Assessment & Plan Note (Addendum)
08/23/20 Hgb 9.9, Vit B12 869, will check Fe, Fe Sat, TIBC, Ferritin, FOBTx3.  08/27/20 wbc 10.8, Hgb 9.8, plt 234, neutrophils 63.7, Iron 60, Fe sat 23, ferritin 22. CBC 2 weeks.

## 2020-08-26 NOTE — Assessment & Plan Note (Signed)
Using CPAP 

## 2020-08-26 NOTE — Assessment & Plan Note (Signed)
stable, on Myrbetriq.

## 2020-08-26 NOTE — Assessment & Plan Note (Signed)
takes Vit B12, Vit B12 869 08/24/20

## 2020-08-26 NOTE — Assessment & Plan Note (Signed)
Blood pressure is controlled,  on Metoprolol, Bun/creat 25/1.2, eGFR 44 08/24/20

## 2020-08-26 NOTE — Assessment & Plan Note (Signed)
Bun/creat 25/1.2, eGFR 44 08/24/20 °

## 2020-08-26 NOTE — Progress Notes (Addendum)
Location:   [facility] Room Number: [phone] Place of Service:  ALF [phone]) Provider: Mekaela Azizi X Kveon Casanas, NP   [place] Huberty X, NP  Patient Care Team: Saman Umstead X, NP as PCP - General (Internal Medicine) Jettie Booze, MD as PCP - Cardiology (Cardiology) Wilford Corner, MD as Consulting Physician (Gastroenterology) Marlou Sa, Tonna Corner, MD as Consulting Physician (Orthopedic Surgery) Marcial Pacas, MD as Consulting Physician (Neurology) Franchot Gallo, MD as Consulting Physician (Urology) Kary Kos, MD as Consulting Physician (Neurosurgery)  Extended Emergency Contact Information Primary Emergency Contact: Merlinda Frederick Address: [address]          [place] [address] [place] Phone: [phone] Mobile Phone: [phone] Relation: Spouse Secondary Emergency Contact: [place] Mobile Phone: [phone] Relation: Son  Code Status:  DNR Goals of care: Advanced Directive information Advanced Directives 08/26/2020  Does Patient Have a Medical Advance Directive? Yes  Type of Paramedic of [place];Living will;Out of facility DNR (pink MOST or yellow form)  Does patient want to make changes to medical advance directive? No - Patient declined  Copy of [place] in Chart? Yes - validated most recent copy scanned in chart (See row information)  Would patient like information on creating a medical advance directive? -  Pre-existing out of facility DNR order (yellow form or pink MOST form) Yellow form placed in chart (order not valid for inpatient use);Pink MOST form placed in chart (order not valid for inpatient use)     Chief Complaint  Patient presents with  . Acute Visit    Patient being seen for anemia.     HPI:  Pt is a 80 y.o. female seen today for an acute visit for Hgb 9.9 08/23/20, the patient denied abd pain, nausea, vomiting, indigestion, blood in urine or stool. She does  admitted somehow she gets tired easily, DOE, but denied cough, sputum production, chest pain/pressure, palpitation, or swelling.   HTN, on Metoprolol, Bun/creat 25/1.2, eGFR 44 08/24/20  CKD IIIa Bun/creat 25/1.2, eGFR 44 08/24/20  CAD, on Isosorbide and Ranolazine, Plavix. LDL 70 08/24/20 Hypothyroidism, on Levothyroxine 29mcg qd.TSH 2.10 08/24/20 Urinary frequency, stable, on Myrbetriq.  GERD, stable, on Pantoprazole.Hgb 9.9 08/24/20             Her mood is stable, on Venlafaxine $RemoveBefore'150mg'YARJvIhDbMvwN$  qd. TSH 2.10 08/24/20 OA, takes Tylenol OP, takes Alendronate, Ca, Vit D Vit B12 deficiency, takes Vit B12, Vit B12 869 08/24/20 PMR, takes Prednisone $RemoveBeforeDE'5mg'tcoDOclxhUmSQbV$  qd.  OSA ,uses CPAP    Past Medical History:  Diagnosis Date  . Anemia   . Arthritis    "fingers, back, shoulders, hips" (04/10/2016)  . Chronic diastolic CHF (congestive heart failure) (HCC)    a. normal EF, LV[facility]P [facility] in 6/17 33 >> Lasix started   . CKD (chronic kidney disease), stage III ([place])   . Coronary artery disease    a. s/p CABG 2000 (SVG/ free LIMA Y graft to the diagonal and distal LAD, SVG to the OM 1, and SVG to the PDA) . b. Cath 12/19/2014 90% dSVG to RCA s/p 2 overlapping DES. c. LHC 2016, 2017 no intervention except diuresis needed. d. Low risk nuc 03/2016. e. Cath 12/2017 patent LIMA-LAD, SVG-OM, SVG-PDA but occluded SVG to diag.   . Depression    with anxious component  . GERD (gastroesophageal reflux disease)   . History of hiatal hernia   . Hyperlipidemia   . Hypothyroidism   . Obesity   .  OSA on CPAP   . PMR (polymyalgia rheumatica) (HCC)   . Pneumonia    "2-3 times" (04/10/2016)  . Stable angina (HCC)    microvascular, improved with Ranexa  . Subclavian artery stenosis (HCC)    a. L by cath note in 2016.  Marland Kitchen TIA (transient ischemic attack)    Past Surgical History:  Procedure Laterality Date  . BREAST BIOPSY Right 1964  . CARDIAC  CATHETERIZATION  08   patent grafts, no culprit lesions, EF 65%  . CARDIAC CATHETERIZATION N/A 12/19/2014   Procedure: Left Heart Cath and Cors/Grafts Angiography;  Surgeon: Jettie Booze, MD;  Location: [facility];  Service: Cardiovascular;  Laterality: N/A;  . CARDIAC CATHETERIZATION N/A 12/19/2014   Procedure: Coronary Stent Intervention;  Surgeon: Jettie Booze, MD;  Location: [facility];  Service: Cardiovascular;  Laterality: N/A;  . CARDIAC CATHETERIZATION N/A 01/01/2015   Procedure: Left Heart Cath and Cors/Grafts Angiography;  Surgeon: Jettie Booze, MD;  Location: [facility];  Service: Cardiovascular;  Laterality: N/A;  . CARDIAC CATHETERIZATION N/A 09/20/2015   Procedure: Left Heart Cath and Cors/Grafts Angiography;  Surgeon: Jettie Booze, MD;  Location: [facility];  Service: Cardiovascular;  Laterality: N/A;  . CATARACT EXTRACTION W/ INTRAOCULAR LENS  IMPLANT, BILATERAL Bilateral 2011  . COLONOSCOPY WITH PROPOFOL N/A 07/27/2016   Procedure: COLONOSCOPY WITH PROPOFOL;  Surgeon: Wilford Corner, MD;  Location: [facility];  Service: Endoscopy;  Laterality: N/A;  . CORONARY ARTERY BYPASS GRAFT  2000   ASCVD, multivessel, S./P.  . DILATION AND CURETTAGE OF UTERUS  1980s  . ESOPHAGOGASTRODUODENOSCOPY (EGD) WITH PROPOFOL N/A 07/27/2016   Procedure: ESOPHAGOGASTRODUODENOSCOPY (EGD) WITH PROPOFOL;  Surgeon: Wilford Corner, MD;  Location: [facility];  Service: Endoscopy;  Laterality: N/A;  . FRACTURE SURGERY    . LEFT HEART CATH AND CORS/GRAFTS ANGIOGRAPHY N/A 01/12/2018   Procedure: LEFT HEART CATH AND CORS/GRAFTS ANGIOGRAPHY;  Surgeon: Jettie Booze, MD;  Location: [facility];  Service: Cardiovascular;  Laterality: N/A;  . PATELLA FRACTURE SURGERY Left 1993    Allergies  Allergen Reactions  . Nsaids Other (See Comments)    Due to chronic kidney failure  . Butorphanol Other (See Comments)     agitation Constipation Produced a lot of urine, made patient feel crazy  . Sulfa Antibiotics Rash  . Bisoprolol Fumarate Other (See Comments)    Doesn't remember  . Butorphanol Tartrate Other (See Comments)    Produced a lot of urine, made patient feel crazy  . Codeine Nausea And Vomiting  . Demerol [Meperidine] Nausea And Vomiting  . Imipramine     Sweating, facial dysfunction   . Meperidine And Related Nausea And Vomiting  . Statins     MYALGIAS  . Tequin [Gatifloxacin] Other (See Comments)    Caused hypoglycemia Low blood sugar  . Brilinta [Ticagrelor] Rash    CAUSES PETECHIAE, PURPURA  . Pseudoephedrine Hcl Palpitations  . Septra [Sulfamethoxazole-Trimethoprim] Rash  . Sulfamethoxazole-Trimethoprim Rash    Allergies as of 08/26/2020      Reactions   Nsaids Other (See Comments)   Due to chronic kidney failure   Butorphanol Other (See Comments)   agitation Constipation Produced a lot of urine, made patient feel crazy   Sulfa Antibiotics Rash   Bisoprolol Fumarate Other (See Comments)   Doesn't remember   Butorphanol Tartrate Other (See Comments)   Produced a lot of urine, made patient feel crazy   Codeine Nausea And Vomiting  Demerol [meperidine] Nausea And Vomiting   Imipramine    Sweating, facial dysfunction    Meperidine And Related Nausea And Vomiting   Statins    MYALGIAS   Tequin [gatifloxacin] Other (See Comments)   Caused hypoglycemia Low blood sugar   Brilinta [ticagrelor] Rash   CAUSES PETECHIAE, PURPURA   Pseudoephedrine Hcl Palpitations   Septra [sulfamethoxazole-trimethoprim] Rash   Sulfamethoxazole-trimethoprim Rash      Medication List       Accurate as of August 26, 2020 11:59 PM. If you have any questions, ask your nurse or doctor.        STOP taking these medications   loperamide 2 MG tablet Commonly known as: IMODIUM A-D Stopped by: Jahmia Berrett X Kelee Cunningham, NP     TAKE these medications   acetaminophen 325 MG tablet Commonly known as:  TYLENOL Take 650 mg by mouth in the morning and at bedtime.   alendronate 70 MG tablet Commonly known as: FOSAMAX Take 70 mg by mouth every Sunday.   alum & mag hydroxide-simeth 400-400-40 MG/5ML suspension Commonly known as: MAALOX PLUS Take 10 mLs by mouth as needed for indigestion.   atorvastatin 10 MG tablet Commonly known as: LIPITOR TAKE 1 TABLET ONCE DAILY.   Biofreeze 10 % Liqd Generic drug: Menthol (Topical Analgesic) Apply topically as needed.   calcium carbonate 600 MG Tabs tablet Commonly known as: OS-CAL Take 600 mg by mouth 2 (two) times daily with a meal.   clopidogrel 75 MG tablet Commonly known as: PLAVIX TAKE 1 TABLET ONCE DAILY.   COENZYME Q-10 PO Take 10 mg by mouth at bedtime.   cyanocobalamin 1000 MCG tablet Take 1,000 mcg by mouth daily.   fluticasone 50 MCG/ACT nasal spray Commonly known as: FLONASE Place 2 sprays into both nostrils daily as needed for allergies.   Glucosamine 1500 Complex Caps Take 3 capsules by mouth daily after breakfast.   isosorbide mononitrate 60 MG 24 hr tablet Commonly known as: IMDUR Take 2 tablets (120 mg total) by mouth daily.   levocetirizine 5 MG tablet Commonly known as: XYZAL TAKE 1 TABLET ONCE DAILY IN THE EVENING.   levothyroxine 75 MCG tablet Commonly known as: SYNTHROID TAKE 1 TABLET ONCE DAILY ON EMPTY STOMACH.   melatonin 1 MG Tabs tablet Take by mouth at bedtime.   metoprolol succinate 25 MG 24 hr tablet Commonly known as: TOPROL-XL Take 25 mg by mouth in the morning and at bedtime. Take with or immediately following a meal.   Myrbetriq 50 MG Tb24 tablet Generic drug: mirabegron ER TAKE 1 TABLET BY MOUTH DAILY.   nitroGLYCERIN 0.4 MG SL tablet Commonly known as: NITROSTAT Place 0.4 mg under the tongue every 5 (five) minutes as needed for chest pain. X 3 doses   nystatin powder Commonly known as: MYCOSTATIN/NYSTOP Apply 1 application topically. apply underneath abd/breast folds As  Needed   pantoprazole 40 MG tablet Commonly known as: PROTONIX Take 1 tablet (40 mg total) by mouth daily.   predniSONE 5 MG tablet Commonly known as: DELTASONE Take 5 mg by mouth daily.   PSYLLIUM HUSK PO Take 0.4 g by mouth daily. May keep at bedside   ranolazine 1000 MG SR tablet Commonly known as: RANEXA TAKE 1 TABLET BY MOUTH TWICE DAILY.   venlafaxine 75 MG tablet Commonly known as: EFFEXOR Take 150 mg by mouth. 2 caps= 150 mg; oral  Once A Day   Vitamin D 50 MCG (2000 UT) tablet Take 2,000 Units by mouth daily.  zinc oxide 20 % ointment Apply 1 application topically as needed for irritation.       Review of Systems  Constitutional: Negative for activity change, appetite change, fatigue and fever.  HENT: Positive for hearing loss. Negative for congestion and voice change.   Eyes: Negative for visual disturbance.  Respiratory: Positive for shortness of breath. Negative for cough and wheezing.        DOE is chronic  Cardiovascular: Negative for chest pain, palpitations and leg swelling.  Gastrointestinal: Negative for abdominal pain and constipation.  Genitourinary: Negative for dysuria, frequency and urgency.       1-2x/night bathroom trips.   Musculoskeletal: Positive for arthralgias, gait problem and myalgias.  Skin: Negative for color change.  Neurological: Negative for speech difficulty, weakness and light-headedness.  Psychiatric/Behavioral: Positive for sleep disturbance. Negative for behavioral problems and hallucinations. The patient is not nervous/anxious.        Sometimes not sleeping well    Immunization History  Administered Date(s) Administered  . HPV Bivalent 11/24/2013, 01/17/2016  . Influenza Whole 12/23/2017  . Influenza, High Dose Seasonal PF 12/24/2018, 12/24/2018  . Influenza-Unspecified 01/10/2013, 12/27/2014, 12/30/2016, 01/02/2020  . Moderna Sars-Covid-2 Vaccination 03/25/2019, 04/22/2019, 01/30/2020  . Pneumococcal Conjugate-13  04/21/2013  . Pneumococcal Polysaccharide-23 02/27/1993, 03/23/2009  . Tdap 04/20/2012  . Zoster, Live 11/10/2010   Pertinent  Health Maintenance Due  Topic Date Due  . INFLUENZA VACCINE  10/21/2020  . DEXA SCAN  Completed  . PNA vac Low Risk Adult  Completed   Fall Risk  02/10/2019 10/06/2018 08/18/2018 01/27/2018 12/07/2017  Falls in the past year? 0 1 1 1  Yes  Number falls in past yr: 0 0 0 1 2 or more  Injury with Fall? - 0 1 0 No   Functional Status Survey:    Vitals:   08/26/20 1059  BP: 118/70  Pulse: 70  Resp: 18  Temp: 97.8 F (36.6 C)  SpO2: 98%  Weight: 196 lb 12.8 oz (89.3 kg)  Height: 4\' 10"  (1.473 m)   Body mass index is 41.13 kg/m. Physical Exam Vitals and nursing note reviewed.  Constitutional:      Appearance: Normal appearance.  HENT:     Head: Normocephalic and atraumatic.     Mouth/Throat:     Mouth: Mucous membranes are moist.  Eyes:     Extraocular Movements: Extraocular movements intact.     Conjunctiva/sclera: Conjunctivae normal.     Pupils: Pupils are equal, round, and reactive to light.  Cardiovascular:     Rate and Rhythm: Normal rate and regular rhythm.     Heart sounds: No gallop.   Pulmonary:     Breath sounds: No rhonchi or rales.  Abdominal:     General: Bowel sounds are normal.     Palpations: Abdomen is soft.     Tenderness: There is no abdominal tenderness.  Musculoskeletal:     Cervical back: Normal range of motion and neck supple.     Right lower leg: No edema.     Left lower leg: No edema.  Skin:    General: Skin is warm and dry.  Neurological:     General: No focal deficit present.     Mental Status: She is alert and oriented to person, place, and time. Mental status is at baseline.     Motor: No weakness.     Coordination: Coordination normal.     Gait: Gait abnormal.  Psychiatric:        Mood and Affect:  Mood normal.        Behavior: Behavior normal.        Thought Content: Thought content normal.         Judgment: Judgment normal.     Labs reviewed: Recent Labs    02/23/20 0000 03/22/20 0000 08/24/20 0000  NA 142 141 139  K 4.6 3.9 4.2  CL 106 105 107  CO2 23* 29* 23*  BUN 26* 20 25*  CREATININE 1.3* 1.0 1.2*  CALCIUM 8.5* 8.7 8.3*   Recent Labs    02/23/20 0000 03/22/20 0000 08/24/20 0000  AST 10* 17 9*  ALT 6* 15 7  ALKPHOS 42 86 42  ALBUMIN 3.8 3.9 3.4*   Recent Labs    02/23/20 0000 03/22/20 0000 08/24/20 0000  WBC 9.8 5.1 11.0  NEUTROABS 6,693.00 3,545.00 7,535.00  HGB 10.9* 13.4 9.9*  HCT 34* 40 30*  PLT 256 170 245   Lab Results  Component Value Date   TSH 2.10 08/24/2020   Lab Results  Component Value Date   HGBA1C 5.1 02/14/2019   Lab Results  Component Value Date   CHOL 151 08/24/2020   HDL 57 08/24/2020   LDLCALC 70 08/24/2020   TRIG 162 (A) 08/24/2020   CHOLHDL 2.1 06/07/2018    Significant Diagnostic Results in last 30 days:  No results found.  Assessment/Plan Anemia due to chronic kidney disease 08/23/20 Hgb 9.9, Vit B12 869, will check Fe, Fe Sat, TIBC, Ferritin, FOBTx3.  08/27/20 wbc 10.8, Hgb 9.8, plt 234, neutrophils 63.7, Iron 60, Fe sat 23, ferritin 22. CBC 2 weeks.   CKD (chronic kidney disease) stage 3, GFR 30-59 ml/min Bun/creat 25/1.2, eGFR 44 08/24/20   Essential hypertension Blood pressure is controlled,  on Metoprolol, Bun/creat 25/1.2, eGFR 44 08/24/20   CAD (coronary artery disease) on Isosorbide and Ranolazine, Plavix. LDL 70 08/24/20  Hypothyroidism on Levothyroxine 58mcg qd.TSH 2.10 08/24/20   Urinary frequency stable, on Myrbetriq.   GERD (gastroesophageal reflux disease) stable, on Pantoprazole.Hgb 9.9 08/24/20   Depression, recurrent ([place]) Her mood is stable, on Venlafaxine $RemoveBefore'150mg'thSWrmohLfmjf$  qd. TSH 2.10 08/24/20   Generalized osteoarthritis of multiple sites takes Tylenol   Osteoporosis takes Alendronate, Ca, Vit D  Vitamin B12 deficiency anemia takes Vit B12, Vit B12 869 08/24/20   Polymyalgia rheumatica  ([place])  takes Prednisone $RemoveBeforeDE'5mg'JXumQIVkeIduZly$  qd.    Sleep apnea Using CPAP    Family/ staff Communication: plan of care reviewed with the patient and charge nurse.   Labs/tests ordered:  Fe, Fe Sat, TIBC, Ferritin, FOBTx3.    Time spend 40 minutes.

## 2020-08-26 NOTE — Assessment & Plan Note (Signed)
stable, on Pantoprazole.Hgb 9.9 08/24/20

## 2020-09-12 LAB — CBC AND DIFFERENTIAL
HCT: 36 (ref 36–46)
Hemoglobin: 11.5 — AB (ref 12.0–16.0)
Platelets: 306 (ref 150–399)
WBC: 11.8

## 2020-09-12 LAB — CBC: RBC: 3.73 — AB (ref 3.87–5.11)

## 2020-10-04 ENCOUNTER — Non-Acute Institutional Stay: Payer: Medicare Other | Admitting: Nurse Practitioner

## 2020-10-04 ENCOUNTER — Encounter: Payer: Self-pay | Admitting: Nurse Practitioner

## 2020-10-04 DIAGNOSIS — N1831 Chronic kidney disease, stage 3a: Secondary | ICD-10-CM

## 2020-10-04 DIAGNOSIS — D519 Vitamin B12 deficiency anemia, unspecified: Secondary | ICD-10-CM

## 2020-10-04 DIAGNOSIS — F339 Major depressive disorder, recurrent, unspecified: Secondary | ICD-10-CM

## 2020-10-04 DIAGNOSIS — M8000XA Age-related osteoporosis with current pathological fracture, unspecified site, initial encounter for fracture: Secondary | ICD-10-CM

## 2020-10-04 DIAGNOSIS — E039 Hypothyroidism, unspecified: Secondary | ICD-10-CM

## 2020-10-04 DIAGNOSIS — T148XXA Other injury of unspecified body region, initial encounter: Secondary | ICD-10-CM | POA: Diagnosis not present

## 2020-10-04 DIAGNOSIS — M353 Polymyalgia rheumatica: Secondary | ICD-10-CM

## 2020-10-04 DIAGNOSIS — G4733 Obstructive sleep apnea (adult) (pediatric): Secondary | ICD-10-CM | POA: Diagnosis not present

## 2020-10-04 DIAGNOSIS — D631 Anemia in chronic kidney disease: Secondary | ICD-10-CM

## 2020-10-04 DIAGNOSIS — K219 Gastro-esophageal reflux disease without esophagitis: Secondary | ICD-10-CM

## 2020-10-04 DIAGNOSIS — L089 Local infection of the skin and subcutaneous tissue, unspecified: Secondary | ICD-10-CM | POA: Insufficient documentation

## 2020-10-04 DIAGNOSIS — I2510 Atherosclerotic heart disease of native coronary artery without angina pectoris: Secondary | ICD-10-CM

## 2020-10-04 DIAGNOSIS — I10 Essential (primary) hypertension: Secondary | ICD-10-CM

## 2020-10-04 DIAGNOSIS — M159 Polyosteoarthritis, unspecified: Secondary | ICD-10-CM

## 2020-10-04 DIAGNOSIS — R350 Frequency of micturition: Secondary | ICD-10-CM

## 2020-10-04 NOTE — Assessment & Plan Note (Signed)
stable, on Myrbetriq.

## 2020-10-04 NOTE — Assessment & Plan Note (Signed)
uses CPAP 

## 2020-10-04 NOTE — Assessment & Plan Note (Signed)
left forearm near elbow skin abrasion, yellow drainage and light bruises lateral left lower leg sustained from fall when the patient was out of facility. Pain, slightly redness and warmth peri wound. Will apply Bactroban ointment daily x 10 days.

## 2020-10-04 NOTE — Assessment & Plan Note (Signed)
Blood pressure is controlled,  on Metoprolol, Bun/creat 25/1.2, eGFR 44 08/24/20

## 2020-10-04 NOTE — Assessment & Plan Note (Signed)
Her mood is stable, on Venlafaxine 150mg  qd. TSH 2.10 08/24/20

## 2020-10-04 NOTE — Assessment & Plan Note (Addendum)
takes Tylenol    

## 2020-10-04 NOTE — Assessment & Plan Note (Signed)
CKD IIIa Bun/creat 25/1.2, eGFR 44 08/24/20

## 2020-10-04 NOTE — Assessment & Plan Note (Signed)
takes Alendronate, Ca, Vit D

## 2020-10-04 NOTE — Assessment & Plan Note (Signed)
Hgb 9.9 Vit B12 869 08/24/20>>11.5 09/12/20

## 2020-10-04 NOTE — Assessment & Plan Note (Signed)
takes Prednisone 5mg  qd.

## 2020-10-04 NOTE — Assessment & Plan Note (Signed)
stable, on Pantoprazole.  Hgb 11.5 09/12/20

## 2020-10-04 NOTE — Progress Notes (Signed)
Location:   [facility] Room Number: [place] of Service:  ALF [phone]) Provider:  Jacqueline Delapena X Miriah Maruyama, NP  Shante Maysonet X, NP  Patient Care Team: Kysa Calais X, NP as PCP - General (Internal Medicine) Jettie Booze, MD as PCP - Cardiology (Cardiology) Wilford Corner, MD as Consulting Physician (Gastroenterology) Marlou Sa, Tonna Corner, MD as Consulting Physician (Orthopedic Surgery) Marcial Pacas, MD as Consulting Physician (Neurology) Franchot Gallo, MD as Consulting Physician (Urology) Kary Kos, MD as Consulting Physician (Neurosurgery)  Extended Emergency Contact Information Primary Emergency Contact: Merlinda Frederick Address: [address]          [place] [address] [place] Phone: [phone] Mobile Phone: [phone] Relation: Spouse Secondary Emergency Contact: [place] Mobile Phone: [phone] Relation: Son  Code Status:  DNR Goals of care: Advanced Directive information Advanced Directives 10/04/2020  Does Patient Have a Medical Advance Directive? Yes  Type of Paramedic of [place];Living will;Out of facility DNR (pink MOST or yellow form)  Does patient want to make changes to medical advance directive? No - Patient declined  Copy of [place] in Chart? Yes - validated most recent copy scanned in chart (See row information)  Would patient like information on creating a medical advance directive? -  Pre-existing out of facility DNR order (yellow form or pink MOST form) Yellow form placed in chart (order not valid for inpatient use);Pink MOST form placed in chart (order not valid for inpatient use)     Chief Complaint  Patient presents with   Acute Visit    Patient presents for an infected tear on left elbow.     HPI:  Pt is a 80 y.o. female seen today for an acute visit for left forearm skin abrasion, yellow drainage and light bruises lateral left lower leg  sustained from fall when the patient was out of facility. Pain, slightly redness and warmth peri wound.   Hgb 9.9 Vit B12 869 08/24/20>>11.5 09/12/20             HTN, on Metoprolol, Bun/creat 25/1.2, eGFR 44 08/24/20             CKD IIIa Bun/creat 25/1.2, eGFR 44 08/24/20             CAD, on Isosorbide and Ranolazine, Plavix. LDL 70 08/24/20             Hypothyroidism, on Levothyroxine 8mg qd. TSH 2.10 08/24/20             Urinary frequency, stable, on Myrbetriq.             GERD, stable, on Pantoprazole.  Hgb 11.5 09/12/20             Her mood is stable, on Venlafaxine 1559mqd. TSH 2.10 08/24/20             OA, takes Tylenol             OP, takes Alendronate, Ca, Vit D             Vit B12 deficiency, takes Vit B12, Vit B12 162 08/03/19>>869 08/24/20             PMR, takes Prednisone 5m79md.             OSA ,uses CPAP    Past Medical History:  Diagnosis Date   Anemia    Arthritis    "fingers, back, shoulders, hips" (04/10/2016)   Chronic  diastolic CHF (congestive heart failure) (HCC)    a. normal EF, [facility] in 6/17 33 >> Lasix started    CKD (chronic kidney disease), stage III (HCC)    Coronary artery disease    a. s/p CABG 2000 (SVG/ free LIMA Y graft to the diagonal and distal LAD, SVG to the OM 1, and SVG to the PDA) . b. Cath 12/19/2014 90% dSVG to RCA s/p 2 overlapping DES. c. LHC 2016, 2017 no intervention except diuresis needed. d. Low risk nuc 03/2016. e. Cath 12/2017 patent LIMA-LAD, SVG-OM, SVG-PDA but occluded SVG to diag.    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 L by cath note in 2016.   TIA (transient ischemic attack)    Past Surgical History:  Procedure Laterality  Date   BREAST BIOPSY Right 1964   CARDIAC CATHETERIZATION  08   patent grafts, no culprit lesions, EF 65%   CARDIAC CATHETERIZATION N/A 12/19/2014   Procedure: Left Heart Cath and Cors/Grafts Angiography;  Surgeon: Jettie Booze, MD;  Location: [facility];  Service: Cardiovascular;  Laterality: N/A;   CARDIAC CATHETERIZATION N/A 12/19/2014   Procedure: Coronary Stent Intervention;  Surgeon: Jettie Booze, MD;  Location: [facility];  Service: Cardiovascular;  Laterality: N/A;   CARDIAC CATHETERIZATION N/A 01/01/2015   Procedure: Left Heart Cath and Cors/Grafts Angiography;  Surgeon: Jettie Booze, MD;  Location: [facility];  Service: Cardiovascular;  Laterality: N/A;   CARDIAC CATHETERIZATION N/A 09/20/2015   Procedure: Left Heart Cath and Cors/Grafts Angiography;  Surgeon: Jettie Booze, MD;  Location: [facility];  Service: Cardiovascular;  Laterality: N/A;   CATARACT EXTRACTION W/ INTRAOCULAR LENS  IMPLANT, BILATERAL Bilateral 2011   COLONOSCOPY WITH PROPOFOL N/A 07/27/2016   Procedure: COLONOSCOPY WITH PROPOFOL;  Surgeon: Wilford Corner, MD;  Location: [facility];  Service: Endoscopy;  Laterality: N/A;   CORONARY ARTERY BYPASS GRAFT  2000   ASCVD, multivessel, S./P.   DILATION AND CURETTAGE OF UTERUS  1980s   ESOPHAGOGASTRODUODENOSCOPY (EGD) WITH PROPOFOL N/A 07/27/2016   Procedure: ESOPHAGOGASTRODUODENOSCOPY (EGD) WITH PROPOFOL;  Surgeon: Wilford Corner, MD;  Location: [facility];  Service: Endoscopy;  Laterality: N/A;   FRACTURE SURGERY     LEFT HEART CATH AND CORS/GRAFTS ANGIOGRAPHY N/A 01/12/2018   Procedure: LEFT HEART CATH AND CORS/GRAFTS ANGIOGRAPHY;  Surgeon: Jettie Booze, MD;  Location: [facility];  Service: Cardiovascular;  Laterality: N/A;   PATELLA FRACTURE SURGERY Left 1993    Allergies  Allergen Reactions   Nsaids Other (See Comments)    Due to chronic kidney failure   Butorphanol Other (See  Comments)    agitation Constipation Produced a lot of urine, made patient feel crazy   Sulfa Antibiotics Rash   Bisoprolol Fumarate Other (See Comments)    Doesn't remember   Butorphanol Tartrate Other (See Comments)    Produced a lot of urine, made patient feel crazy   Codeine Nausea And Vomiting   Demerol [Meperidine] Nausea And Vomiting   Imipramine     Sweating, facial dysfunction  Meperidine And Related Nausea And Vomiting   Statins     MYALGIAS   Tequin [Gatifloxacin] Other (See Comments)    Caused hypoglycemia Low blood sugar   Brilinta [Ticagrelor] Rash    CAUSES PETECHIAE, PURPURA   Pseudoephedrine Hcl Palpitations   Septra [Sulfamethoxazole-Trimethoprim] Rash   Sulfamethoxazole-Trimethoprim Rash    Allergies as of 10/04/2020       Reactions   Nsaids Other (See Comments)   Due to chronic kidney failure   Butorphanol Other (See Comments)   agitation Constipation Produced a lot of urine, made patient feel crazy   Sulfa Antibiotics Rash   Bisoprolol Fumarate Other (See Comments)   Doesn't remember   Butorphanol Tartrate Other (See Comments)   Produced a lot of urine, made patient feel crazy   Codeine Nausea And Vomiting   Demerol [meperidine] Nausea And Vomiting   Imipramine    Sweating, facial dysfunction    Meperidine And Related Nausea And Vomiting   Statins    MYALGIAS   Tequin [gatifloxacin] Other (See Comments)   Caused hypoglycemia Low blood sugar   Brilinta [ticagrelor] Rash   CAUSES PETECHIAE, PURPURA   Pseudoephedrine Hcl Palpitations   Septra [sulfamethoxazole-trimethoprim] Rash   Sulfamethoxazole-trimethoprim Rash        Medication List        Accurate as of October 04, 2020  3:11 PM. If you have any questions, ask your nurse or doctor.          acetaminophen 325 MG tablet Commonly known as: TYLENOL Take 650 mg by mouth in the morning and at bedtime.   alendronate 70 MG tablet Commonly known as: FOSAMAX Take 70 mg by mouth  every Sunday.   alum & mag hydroxide-simeth 127-517-00 MG/5ML suspension Commonly known as: MAALOX PLUS Take 10 mLs by mouth as needed for indigestion.   atorvastatin 10 MG tablet Commonly known as: LIPITOR TAKE 1 TABLET ONCE DAILY.   Biofreeze 10 % Liqd Generic drug: Menthol (Topical Analgesic) Apply topically as needed.   calcium carbonate 600 MG Tabs tablet Commonly known as: OS-CAL Take 600 mg by mouth 2 (two) times daily with a meal.   clopidogrel 75 MG tablet Commonly known as: PLAVIX TAKE 1 TABLET ONCE DAILY.   COENZYME Q-10 PO Take 10 mg by mouth at bedtime.   cyanocobalamin 1000 MCG tablet Take 1,000 mcg by mouth daily.   fluticasone 50 MCG/ACT nasal spray Commonly known as: FLONASE Place 2 sprays into both nostrils daily as needed for allergies.   Glucosamine 1500 Complex Caps Take 3 capsules by mouth daily after breakfast.   isosorbide mononitrate 60 MG 24 hr tablet Commonly known as: IMDUR Take 2 tablets (120 mg total) by mouth daily.   levocetirizine 5 MG tablet Commonly known as: XYZAL TAKE 1 TABLET ONCE DAILY IN THE EVENING.   levothyroxine 75 MCG tablet Commonly known as: SYNTHROID TAKE 1 TABLET ONCE DAILY ON EMPTY STOMACH.   melatonin 1 MG Tabs tablet Take by mouth at bedtime.   metoprolol succinate 25 MG 24 hr tablet Commonly known as: TOPROL-XL Take 25 mg by mouth in the morning and at bedtime. Take with or immediately following a meal.   Myrbetriq 50 MG Tb24 tablet Generic drug: mirabegron ER TAKE 1 TABLET BY MOUTH DAILY.   nitroGLYCERIN 0.4 MG SL tablet Commonly known as: NITROSTAT Place 0.4 mg under the tongue every 5 (five) minutes as needed for chest pain. X 3 doses   nystatin powder Commonly known as: MYCOSTATIN/NYSTOP Apply 1  application topically. apply underneath abd/breast folds As Needed   pantoprazole 40 MG tablet Commonly known as: PROTONIX Take 1 tablet (40 mg total) by mouth daily.   predniSONE 5 MG  tablet Commonly known as: DELTASONE Take 5 mg by mouth daily.   PSYLLIUM HUSK PO Take 0.4 g by mouth daily. May keep at bedside   ranolazine 1000 MG SR tablet Commonly known as: RANEXA TAKE 1 TABLET BY MOUTH TWICE DAILY.   venlafaxine 75 MG tablet Commonly known as: EFFEXOR Take 150 mg by mouth. 2 caps= 150 mg; oral  Once A Day   Vitamin D 50 MCG (2000 UT) tablet Take 2,000 Units by mouth daily.   zinc oxide 20 % ointment Apply 1 application topically as needed for irritation.        Review of Systems  Constitutional:  Negative for activity change, appetite change, fatigue and fever.  HENT:  Positive for hearing loss. Negative for congestion and voice change.   Eyes:  Negative for visual disturbance.  Respiratory:  Positive for shortness of breath. Negative for cough and wheezing.        DOE is chronic  Cardiovascular:  Negative for leg swelling.  Gastrointestinal:  Negative for abdominal pain and constipation.  Genitourinary:  Negative for dysuria, frequency and urgency.       1-2x/night bathroom trips.   Musculoskeletal:  Positive for arthralgias, gait problem and myalgias.  Skin:  Positive for wound. Negative for color change.  Neurological:  Negative for speech difficulty, weakness and headaches.  Psychiatric/Behavioral:  Positive for sleep disturbance. Negative for hallucinations. The patient is not nervous/anxious.        Sometimes not sleeping well   Immunization History  Administered Date(s) Administered   HPV Bivalent 11/24/2013, 01/17/2016   Influenza Whole 12/23/2017   Influenza, High Dose Seasonal PF 12/24/2018, 12/24/2018   Influenza-Unspecified 01/10/2013, 12/27/2014, 12/30/2016, 01/02/2020   Moderna Sars-Covid-2 Vaccination 03/25/2019, 04/22/2019, 01/30/2020   Pneumococcal Conjugate-13 04/21/2013   Pneumococcal Polysaccharide-23 02/27/1993, 03/23/2009   Tdap 04/20/2012   Zoster, Live 11/10/2010   Pertinent  Health Maintenance Due  Topic Date Due    INFLUENZA VACCINE  10/21/2020   DEXA SCAN  Completed   PNA vac Low Risk Adult  Completed   Fall Risk  02/10/2019 10/06/2018 08/18/2018 01/27/2018 12/07/2017  Falls in the past year? 0 _0 Yes  Number falls in past yr: 0 0 0 1 2 or more  Injury with Fall? - 0 1 0 No   Functional Status Survey:    Vitals:   10/04/20 1039  BP: 122/70  Pulse: 68  Resp: 18  Temp: 98.2 F (36.8 C)  SpO2: 97%  Weight: 196 lb 12.8 oz (89.3 kg)  Height: _1  (1.473 m)   Body mass index is 41.13 kg/m. Physical Exam Vitals and nursing note reviewed.  Constitutional:      Appearance: Normal appearance.  HENT:     Head: Normocephalic and atraumatic.     Mouth/Throat:     Mouth: Mucous membranes are moist.  Eyes:     Extraocular Movements: Extraocular movements intact.     Conjunctiva/sclera: Conjunctivae normal.     Pupils: Pupils are equal, round, and reactive to light.  Cardiovascular:     Rate and Rhythm: Normal rate and regular rhythm.     Heart sounds:    No gallop.  Pulmonary:     Breath sounds: No rhonchi or rales.  Abdominal:     General: Bowel sounds are normal.  Palpations: Abdomen is soft.     Tenderness: There is no abdominal tenderness.  Musculoskeletal:     Cervical back: Normal range of motion and neck supple.     Right lower leg: No edema.     Left lower leg: No edema.  Skin:    General: Skin is warm and dry.     Findings: Bruising present.     Comments: Lateral left lower leg. Skin abrasion left forearm near the left elbow, yellow drainage, mild erythema and warmth peri wound.   Neurological:     General: No focal deficit present.     Mental Status: She is alert and oriented to person, place, and time. Mental status is at baseline.     Motor: No weakness.     Coordination: Coordination normal.     Gait: Gait abnormal.  Psychiatric:        Mood and Affect: Mood normal.        Behavior: Behavior normal.        Thought Content: Thought content normal.         Judgment: Judgment normal.    Labs reviewed: Recent Labs    02/23/20 0000 03/22/20 0000 08/24/20 0000  NA 142 141 139  K 4.6 3.9 4.2  CL 106 105 107  CO2 23* 29* 23*  BUN 26* 20 25*  CREATININE 1.3* 1.0 1.2*  CALCIUM 8.5* 8.7 8.3*   Recent Labs    02/23/20 0000 03/22/20 0000 08/24/20 0000  AST 10* 17 9*  ALT 6* 15 7  ALKPHOS 42 86 42  ALBUMIN 3.8 3.9 3.4*   Recent Labs    02/23/20 0000 03/22/20 0000 08/24/20 0000 09/12/20 0000  WBC 9.8 5.1 11.0 11.8  NEUTROABS 6,693.00 3,545.00 7,535.00  --   HGB 10.9* 13.4 9.9* 11.5*  HCT 34* 40 30* 36  PLT 256 170 245 306   Lab Results  Component Value Date   TSH 2.10 08/24/2020   Lab Results  Component Value Date   HGBA1C 5.1 02/14/2019   Lab Results  Component Value Date   CHOL 151 08/24/2020   HDL 57 08/24/2020   LDLCALC 70 08/24/2020   TRIG 162 (A) 08/24/2020   CHOLHDL 2.1 06/07/2018    Significant Diagnostic Results in last 30 days:  No results found.  Assessment/Plan Infected skin tear left forearm near elbow skin abrasion, yellow drainage and light bruises lateral left lower leg sustained from fall when the patient was out of facility. Pain, slightly redness and warmth peri wound. Will apply Bactroban ointment daily x 10 days.   Sleep apnea uses CPAP  Polymyalgia rheumatica (HCC) takes Prednisone 8m qd.  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 TSH 2.10 08/24/20  GERD (gastroesophageal reflux disease) stable, on Pantoprazole.  Hgb 11.5 09/12/20  Urinary frequency stable, on Myrbetriq.  Hypothyroidism on Levothyroxine 7590mqd. TSH 2.10 08/24/20  CAD (coronary artery disease) on Isosorbide and Ranolazine, Plavix. LDL 70 08/24/20  CKD (chronic kidney disease) stage 3, GFR  30-59 ml/min CKD IIIa Bun/creat 25/1.2, eGFR 44 08/24/20  Essential hypertension Blood pressure is controlled,  on Metoprolol, Bun/creat 25/1.2, eGFR 44 08/24/20  Anemia due to chronic kidney disease Hgb 9.9 Vit B12 869 08/24/20>>11.5 09/12/20    Family/ staff Communication: plan of care reviewed with the patient  and charge nurse.   Labs/tests ordered:  none  Time spend 40 minutes.

## 2020-10-04 NOTE — Assessment & Plan Note (Signed)
on Isosorbide and Ranolazine, Plavix. LDL 70 08/24/20 

## 2020-10-04 NOTE — Assessment & Plan Note (Signed)
on Levothyroxine 41mcg qd. TSH 2.10 08/24/20

## 2020-10-04 NOTE — Assessment & Plan Note (Signed)
Vit B12 deficiency, takes Vit B12, Vit B12 869 08/24/20

## 2020-11-14 ENCOUNTER — Ambulatory Visit: Payer: Medicare Other | Admitting: Interventional Cardiology

## 2020-11-14 NOTE — Progress Notes (Deleted)
Cardiology Office Note   Date:  11/14/2020   ID:  Erin, Ingram 1940/08/04, MRN [id number]  PCP:  Mast, Man X, NP    No chief complaint on file.  CAD  Wt Readings from Last 3 Encounters:  10/04/20 196 lb 12.8 oz (89.3 kg)  08/26/20 196 lb 12.8 oz (89.3 kg)  08/22/20 196 lb 12.8 oz (89.3 kg)       History of Present Illness: Erin Ingram is a 80 y.o. female  with history of CAD (CABG 2000, 2 DES to SVG - RCA 11/2014), polymyalgia rheumatica on steroids, TIA, OSA on CPAP, hypothyroidism, HTN, HLD, CKD stage III, left subclavian artery stenosis and chronic diastolic heart failure.   She has history of CABG in 2000 with SVG/free LIMA Y graft to the diagonal and distal LAD, SVG to OM1 and SVG to PDA. She underwent 2 DES to SVG to RCA in September 2016. Cath in 2017 showed stable anatomy with occluded SVG-diag and she had a low risk myoview back in 2018. On 01/11/18 she recently presented to the hospital with a weeks' worth of intermittent chest pain, left arm pain and back pain, mostly at night when at rest. She was admitted with troponins cycled and negative x3. Given her symptoms she was set up for cardiac cath noted above with patent LIMA-LAD, SVG-OM, SVG-PDA but occluded SVG to diag. Normal LVEDP and EF noted at 55-65%. The recommendation was to continue with medical therapy at this time.   The patient previously stopped ASA due to easy bruising. The diagnosis of L subclavian stenosis comes from cath report 11/2014 indicated Left subclavian stenosis prevented left radial access to the aorta. Carotid duplex 2017 showed no significant findings.    Repeat shoulder pain prompted cath in 2019 which showed: Prox LAD to Mid LAD lesion is 25% stenosed. Ost Cx to Dist Cx lesion is 90% stenosed. SVG to OM is widely patent. Mid RCA lesion is 90% stenosed. SVG to PDA is patent. Patent stents in distal graft. SVG to diagonal is occluded. Mid LAD lesion is 90% stenosed. LIMA to LAD is  patent. The left ventricular systolic function is normal. LV end diastolic pressure is normal. LVEDP 10 mm Hg. The left ventricular ejection fraction is 55-65% by visual estimate. There is no aortic valve stenosis.   Pain in shoulder reoslved with PT.   In the past, it was noted, "She does not exercise.  SHe has some DOE.  She has chronic fatigue.  She uses CPAP."   She was hospitalized in 07/2018.  SHe got lightheaded.  SHe nearly passed out.  BP meds were decreased.  She feels better on the current dose of medicines.     Living in [place].  Fall in 07/2019- found sitting next to commode.  She has fallen a few times in the past when she  Trips on something.   She had hit her head in the past.   Most strenuous exercise is walking to dining hall at [facility].  She will start balance exercises.    Husband is now wheelchair bound so they moved to assisted living.      Past Medical History:  Diagnosis Date   Anemia    Arthritis    "fingers, back, shoulders, hips" (04/10/2016)   Chronic diastolic CHF (congestive heart failure) (HCC)    a. normal EF, LVEDP at [facility] in 6/17 33 >> Lasix started    CKD (chronic kidney disease), stage III (  [place])    Coronary artery disease    a. s/p CABG 2000 (SVG/ free LIMA Y graft to the diagonal and distal LAD, SVG to the OM 1, and SVG to the PDA) . b. Cath 12/19/2014 90% dSVG to RCA s/p 2 overlapping DES. c. LHC 2016, 2017 no intervention except diuresis needed. d. Low risk nuc 03/2016. e. Cath 12/2017 patent LIMA-LAD, SVG-OM, SVG-PDA but occluded SVG to diag.    Depression    with anxious component   GERD (gastroesophageal reflux disease)    History of hiatal hernia    Hyperlipidemia    Hypothyroidism    Obesity    OSA on CPAP    PMR (polymyalgia rheumatica) (HCC)    Pneumonia    "2-3 times" (04/10/2016)   Stable angina (HCC)    microvascular, improved with Ranexa   Subclavian artery stenosis ([place])    a. L by cath note in 2016.   TIA  (transient ischemic attack)     Past Surgical History:  Procedure Laterality Date   BREAST BIOPSY Right 1964   CARDIAC CATHETERIZATION  08   patent grafts, no culprit lesions, EF 65%   CARDIAC CATHETERIZATION N/A 12/19/2014   Procedure: Left Heart Cath and Cors/Grafts Angiography;  Surgeon: Jettie Booze, MD;  Location: [place] [facility];  Service: Cardiovascular;  Laterality: N/A;   CARDIAC CATHETERIZATION N/A 12/19/2014   Procedure: Coronary Stent Intervention;  Surgeon: Jettie Booze, MD;  Location: [place] [facility];  Service: Cardiovascular;  Laterality: N/A;   CARDIAC CATHETERIZATION N/A 01/01/2015   Procedure: Left Heart Cath and Cors/Grafts Angiography;  Surgeon: Jettie Booze, MD;  Location: [place] [facility];  Service: Cardiovascular;  Laterality: N/A;   CARDIAC CATHETERIZATION N/A 09/20/2015   Procedure: Left Heart Cath and Cors/Grafts Angiography;  Surgeon: Jettie Booze, MD;  Location: [place] [facility];  Service: Cardiovascular;  Laterality: N/A;   CATARACT EXTRACTION W/ INTRAOCULAR LENS  IMPLANT, BILATERAL Bilateral 2011   COLONOSCOPY WITH PROPOFOL N/A 07/27/2016   Procedure: COLONOSCOPY WITH PROPOFOL;  Surgeon: Wilford Corner, MD;  Location: [place];  Service: Endoscopy;  Laterality: N/A;   CORONARY ARTERY BYPASS GRAFT  2000   ASCVD, multivessel, S./P.   DILATION AND CURETTAGE OF UTERUS  1980s   ESOPHAGOGASTRODUODENOSCOPY (EGD) WITH PROPOFOL N/A 07/27/2016   Procedure: ESOPHAGOGASTRODUODENOSCOPY (EGD) WITH PROPOFOL;  Surgeon: Wilford Corner, MD;  Location: [place];  Service: Endoscopy;  Laterality: N/A;   FRACTURE SURGERY     LEFT HEART CATH AND CORS/GRAFTS ANGIOGRAPHY N/A 01/12/2018   Procedure: LEFT HEART CATH AND CORS/GRAFTS ANGIOGRAPHY;  Surgeon: Jettie Booze, MD;  Location: [place] [facility];  Service: Cardiovascular;  Laterality: N/A;   PATELLA FRACTURE SURGERY Left 1993     Current Outpatient Medications  Medication  Sig Dispense Refill   acetaminophen (TYLENOL) 325 MG tablet Take 650 mg by mouth in the morning and at bedtime.     alendronate (FOSAMAX) 70 MG tablet Take 70 mg by mouth every Sunday.      alum & mag hydroxide-simeth (MAALOX PLUS) 400-400-40 MG/5ML suspension Take 10 mLs by mouth as needed for indigestion.     atorvastatin (LIPITOR) 10 MG tablet TAKE 1 TABLET ONCE DAILY. 90 tablet 2   calcium carbonate (OS-CAL) 600 MG TABS tablet Take 600 mg by mouth 2 (two) times daily with a meal.     Cholecalciferol (VITAMIN D) 50 MCG (2000 UT) tablet Take 2,000 Units by mouth daily.     clopidogrel (  PLAVIX) 75 MG tablet TAKE 1 TABLET ONCE DAILY. 90 tablet 3   COENZYME Q-10 PO Take 10 mg by mouth at bedtime.     cyanocobalamin 1000 MCG tablet Take 1,000 mcg by mouth daily.     fluticasone (FLONASE) 50 MCG/ACT nasal spray Place 2 sprays into both nostrils daily as needed for allergies.      Glucosamine-Chondroit-Vit C-Mn (GLUCOSAMINE 1500 COMPLEX) CAPS Take 3 capsules by mouth daily after breakfast.      isosorbide mononitrate (IMDUR) 60 MG 24 hr tablet Take 2 tablets (120 mg total) by mouth daily. 180 tablet 3   levocetirizine (XYZAL) 5 MG tablet TAKE 1 TABLET ONCE DAILY IN THE EVENING. 90 tablet 1   levothyroxine (SYNTHROID) 75 MCG tablet TAKE 1 TABLET ONCE DAILY ON EMPTY STOMACH. 90 tablet 1   Melatonin 1 MG TABS Take by mouth at bedtime.     Menthol, Topical Analgesic, (BIOFREEZE) 10 % LIQD Apply topically as needed.     metoprolol succinate (TOPROL-XL) 25 MG 24 hr tablet Take 25 mg by mouth in the morning and at bedtime. Take with or immediately following a meal.     MYRBETRIQ 50 MG TB24 tablet TAKE 1 TABLET BY MOUTH DAILY. 90 tablet 0   nitroGLYCERIN (NITROSTAT) 0.4 MG SL tablet Place 0.4 mg under the tongue every 5 (five) minutes as needed for chest pain. X 3 doses     nystatin (MYCOSTATIN/NYSTOP) powder Apply 1 application topically. apply underneath abd/breast folds As Needed     pantoprazole  (PROTONIX) 40 MG tablet Take 1 tablet (40 mg total) by mouth daily. 90 tablet 3   predniSONE (DELTASONE) 5 MG tablet Take 5 mg by mouth daily.      PSYLLIUM HUSK PO Take 0.4 g by mouth daily. May keep at bedside     ranolazine (RANEXA) 1000 MG SR tablet TAKE 1 TABLET BY MOUTH TWICE DAILY. 180 tablet 2   venlafaxine (EFFEXOR) 75 MG tablet Take 150 mg by mouth. 2 caps= 150 mg; oral  Once A Day     zinc oxide 20 % ointment Apply 1 application topically as needed for irritation.     No current facility-administered medications for this visit.    Allergies:   Nsaids, Butorphanol, Sulfa antibiotics, Bisoprolol fumarate, Butorphanol tartrate, Codeine, Demerol [meperidine], Imipramine, Meperidine and related, Statins, Tequin [gatifloxacin], Brilinta [ticagrelor], Pseudoephedrine hcl, Septra [sulfamethoxazole-trimethoprim], and Sulfamethoxazole-trimethoprim    Social History:  The patient  reports that she quit smoking about 51 years ago. Her smoking use included cigarettes. She has a 3.50 pack-year smoking history. She has never used smokeless tobacco. She reports that she does not drink alcohol and does not use drugs.   Family History:  The patient's ***family history includes Diabetes in her brother; Heart attack in her father and mother; Heart disease in her father and mother; Pulmonary embolism in her brother; Stroke in her paternal grandmother.    ROS:  Please see the history of present illness.   Otherwise, review of systems are positive for ***.   All other systems are reviewed and negative.    PHYSICAL EXAM: VS:  There were no vitals taken for this visit. , BMI There is no height or weight on file to calculate BMI. GEN: Well nourished, well developed, in no acute distress HEENT: normal Neck: no JVD, carotid bruits, or masses Cardiac: ***RRR; no murmurs, rubs, or gallops,no edema  Respiratory:  clear to auscultation bilaterally, normal work of breathing GI: soft, nontender, nondistended,  + BS  MS: no deformity or atrophy Skin: warm and dry, no rash Neuro:  Strength and sensation are intact Psych: euthymic mood, full affect   EKG:   The ekg ordered today demonstrates ***   Recent Labs: 08/24/2020: ALT 7; BUN 25; Creatinine 1.2; Potassium 4.2; Sodium 139; TSH 2.10 09/12/2020: Hemoglobin 11.5; Platelets 306   Lipid Panel    Component Value Date/Time   CHOL 151 08/24/2020 0000   TRIG 162 (A) 08/24/2020 0000   HDL 57 08/24/2020 0000   CHOLHDL 2.1 06/07/2018 0710   VLDL 40 01/12/2018 0549   LDLCALC 70 08/24/2020 0000   LDLCALC 65 06/07/2018 0710     Other studies Reviewed: Additional studies/ records that were reviewed today with results demonstrating: ***.   ASSESSMENT AND PLAN:  CAD:  Chronic diastolic heart failure: Hyperlipidemia: Hypertension: Low-salt diet. Morbid obesity:   Current medicines are reviewed at length with the patient today.  The patient concerns regarding her medicines were addressed.  The following changes have been made:  No change***  Labs/ tests ordered today include: *** No orders of the defined types were placed in this encounter.   Recommend 150 minutes/week of aerobic exercise Low fat, low carb, high fiber diet recommended  Disposition:   FU in ***   Signed, Larae Grooms, MD  11/14/2020 9:17 AM    [facility] [place], [place], [place]  [address] Phone: [phone]; Fax: [phone]

## 2020-12-27 ENCOUNTER — Non-Acute Institutional Stay: Payer: Medicare Other | Admitting: Nurse Practitioner

## 2020-12-27 ENCOUNTER — Encounter: Payer: Self-pay | Admitting: Nurse Practitioner

## 2020-12-27 DIAGNOSIS — E039 Hypothyroidism, unspecified: Secondary | ICD-10-CM

## 2020-12-27 DIAGNOSIS — M8000XA Age-related osteoporosis with current pathological fracture, unspecified site, initial encounter for fracture: Secondary | ICD-10-CM

## 2020-12-27 DIAGNOSIS — I2510 Atherosclerotic heart disease of native coronary artery without angina pectoris: Secondary | ICD-10-CM

## 2020-12-27 DIAGNOSIS — K219 Gastro-esophageal reflux disease without esophagitis: Secondary | ICD-10-CM | POA: Diagnosis not present

## 2020-12-27 DIAGNOSIS — R350 Frequency of micturition: Secondary | ICD-10-CM

## 2020-12-27 DIAGNOSIS — I10 Essential (primary) hypertension: Secondary | ICD-10-CM

## 2020-12-27 DIAGNOSIS — F339 Major depressive disorder, recurrent, unspecified: Secondary | ICD-10-CM

## 2020-12-27 DIAGNOSIS — M159 Polyosteoarthritis, unspecified: Secondary | ICD-10-CM | POA: Diagnosis not present

## 2020-12-27 DIAGNOSIS — M353 Polymyalgia rheumatica: Secondary | ICD-10-CM

## 2020-12-27 DIAGNOSIS — D519 Vitamin B12 deficiency anemia, unspecified: Secondary | ICD-10-CM

## 2020-12-27 DIAGNOSIS — N1831 Chronic kidney disease, stage 3a: Secondary | ICD-10-CM

## 2020-12-27 NOTE — Assessment & Plan Note (Signed)
stable, on Pantoprazole.  Hgb 11.5 09/12/20

## 2020-12-27 NOTE — Assessment & Plan Note (Signed)
Her mood is stable, on Venlafaxine 150mg  qd. TSH 2.10 08/24/20

## 2020-12-27 NOTE — Assessment & Plan Note (Signed)
Blood pressure is controlled,  on Metoprolol, Bun/creat 25/1.2, eGFR 44 08/24/20  

## 2020-12-27 NOTE — Assessment & Plan Note (Signed)
on Levothyroxine 41mcg qd. TSH 2.10 08/24/20

## 2020-12-27 NOTE — Assessment & Plan Note (Signed)
takes Alendronate, Ca, Vit D

## 2020-12-27 NOTE — Assessment & Plan Note (Signed)
CKD IIIa Bun/creat 25/1.2, eGFR 44 08/24/20

## 2020-12-27 NOTE — Assessment & Plan Note (Signed)
takes Prednisone 5mg qd(failed GDR) 

## 2020-12-27 NOTE — Assessment & Plan Note (Signed)
on Isosorbide and Ranolazine, Plavix. LDL 70 08/24/20 

## 2020-12-27 NOTE — Assessment & Plan Note (Addendum)
Vit B12 deficiency, takes Vit B12, Vit B12 162 08/03/19>>869 08/24/20, Hgb 9.9 Vit B12 869 08/24/20>>11.5 09/12/20

## 2020-12-27 NOTE — Progress Notes (Signed)
Location:   [facility] Room Number: 903-A Place of Service:  ALF [phone]) Provider:  Jesyca Weisenburger, NP    Patient Care Team: Maisa Bedingfield X, NP as PCP - General (Internal Medicine) Jettie Booze, MD as PCP - Cardiology (Cardiology) Wilford Corner, MD as Consulting Physician (Gastroenterology) Marlou Sa, Tonna Corner, MD as Consulting Physician (Orthopedic Surgery) Marcial Pacas, MD as Consulting Physician (Neurology) Franchot Gallo, MD as Consulting Physician (Urology) Kary Kos, MD as Consulting Physician (Neurosurgery)  Extended Emergency Contact Information Primary Emergency Contact: Merlinda Frederick Address: [address]          [place] [address] [place] Phone: [phone] Mobile Phone: [phone] Relation: Spouse Secondary Emergency Contact: [place] Mobile Phone: [phone] Relation: Son  Code Status:  DNR Goals of care: Advanced Directive information Advanced Directives 12/27/2020  Does Patient Have a Medical Advance Directive? Yes  Type of Paramedic of [place];Out of facility DNR (pink MOST or yellow form)  Does patient want to make changes to medical advance directive? No - Patient declined  Copy of [place] in Chart? Yes - validated most recent copy scanned in chart (See row information)  Would patient like information on creating a medical advance directive? -  Pre-existing out of facility DNR order (yellow form or pink MOST form) -     Chief Complaint  Patient presents with   Medical Management of Chronic Issues    Routine Visit.   Immunizations    Discuss the need for Shingrix vaccine, and Influenza vaccine.    HPI:  Pt is a 80 y.o. female seen today for medical management of chronic diseases.     HTN, on Metoprolol, Bun/creat 25/1.2, eGFR 44 08/24/20             CKD IIIa Bun/creat 25/1.2, eGFR 44 08/24/20             CAD, on Isosorbide and  Ranolazine, Plavix. LDL 70 08/24/20             Hypothyroidism, on Levothyroxine 70mcg qd. TSH 2.10 08/24/20             Urinary frequency, stable, on Myrbetriq.             GERD, stable, on Pantoprazole.  Hgb 11.5 09/12/20             Her mood is stable, on Venlafaxine $RemoveBefore'150mg'ZoLuWuybeExZO$  qd. TSH 2.10 08/24/20             OA, takes Tylenol             OP, takes Alendronate, Ca, Vit D             Vit B12 deficiency, takes Vit B12, Vit B12 162 08/03/19>>869 08/24/20. Hgb 9.9 Vit B12 869 08/24/20>>11.5 09/12/20             PMR, takes Prednisone $RemoveBeforeDE'5mg'LjrzWFLpbQhmNPW$  qd(failed GDR)             OSA ,uses CPAP   Past Medical History:  Diagnosis Date   Anemia    Arthritis    "fingers, back, shoulders, hips" (04/10/2016)   Chronic diastolic CHF (congestive heart failure) (HCC)    a. normal EF, LVEDP at [facility] in 6/17 33 >> Lasix started    CKD (chronic kidney disease), stage III (HCC)    Coronary artery disease    a. s/p CABG 2000 (SVG/ free LIMA Y graft to the diagonal and distal LAD,  SVG to the OM 1, and SVG to the PDA) . b. Cath 12/19/2014 90% dSVG to RCA s/p 2 overlapping DES. c. LHC 2016, 2017 no intervention except diuresis needed. d. Low risk nuc 03/2016. e. Cath 12/2017 patent LIMA-LAD, SVG-OM, SVG-PDA but occluded SVG to diag.    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 L by cath note in 2016.   TIA (transient ischemic attack)    Past Surgical History:  Procedure Laterality Date   BREAST BIOPSY Right 1964   CARDIAC CATHETERIZATION  08   patent grafts, no culprit lesions, EF 65%   CARDIAC CATHETERIZATION N/A 12/19/2014   Procedure: Left Heart Cath and Cors/Grafts Angiography;  Surgeon: Jettie Booze, MD;  Location:  [place] [facility];  Service: Cardiovascular;  Laterality: N/A;   CARDIAC CATHETERIZATION N/A 12/19/2014   Procedure: Coronary Stent Intervention;  Surgeon: Jettie Booze, MD;  Location: [place] [facility];  Service: Cardiovascular;  Laterality: N/A;   CARDIAC CATHETERIZATION N/A 01/01/2015   Procedure: Left Heart Cath and Cors/Grafts Angiography;  Surgeon: Jettie Booze, MD;  Location: [place] [facility];  Service: Cardiovascular;  Laterality: N/A;   CARDIAC CATHETERIZATION N/A 09/20/2015   Procedure: Left Heart Cath and Cors/Grafts Angiography;  Surgeon: Jettie Booze, MD;  Location: [facility];  Service: Cardiovascular;  Laterality: N/A;   CATARACT EXTRACTION W/ INTRAOCULAR LENS  IMPLANT, BILATERAL Bilateral 2011   COLONOSCOPY WITH PROPOFOL N/A 07/27/2016   Procedure: COLONOSCOPY WITH PROPOFOL;  Surgeon: Wilford Corner, MD;  Location: [facility];  Service: Endoscopy;  Laterality: N/A;   CORONARY ARTERY BYPASS GRAFT  2000   ASCVD, multivessel, S./P.   DILATION AND CURETTAGE OF UTERUS  1980s   ESOPHAGOGASTRODUODENOSCOPY (EGD) WITH PROPOFOL N/A 07/27/2016   Procedure: ESOPHAGOGASTRODUODENOSCOPY (EGD) WITH PROPOFOL;  Surgeon: Wilford Corner, MD;  Location: [facility];  Service: Endoscopy;  Laterality: N/A;   FRACTURE SURGERY     LEFT HEART CATH AND CORS/GRAFTS ANGIOGRAPHY N/A 01/12/2018   Procedure: LEFT HEART CATH AND CORS/GRAFTS ANGIOGRAPHY;  Surgeon: Jettie Booze, MD;  Location: [facility];  Service: Cardiovascular;  Laterality: N/A;   PATELLA FRACTURE SURGERY Left 1993    Allergies  Allergen Reactions   Nsaids Other (See Comments)    Due to chronic kidney failure   Butorphanol Other (See Comments)    agitation Constipation Produced a lot of urine, made patient feel crazy   Sulfa Antibiotics Rash   Bisoprolol Fumarate Other (See Comments)    Doesn't remember   Butorphanol Tartrate Other (See Comments)    Produced a lot of urine, made  patient feel crazy   Codeine Nausea And Vomiting   Demerol [Meperidine] Nausea And Vomiting   Imipramine     Sweating, facial dysfunction    Meperidine And Related Nausea And Vomiting   Statins     MYALGIAS   Tequin [Gatifloxacin] Other (See Comments)    Caused hypoglycemia Low blood sugar   Brilinta [Ticagrelor] Rash    CAUSES PETECHIAE, PURPURA   Pseudoephedrine Hcl Palpitations   Septra [Sulfamethoxazole-Trimethoprim] Rash   Sulfamethoxazole-Trimethoprim  Rash    Allergies as of 12/27/2020       Reactions   Nsaids Other (See Comments)   Due to chronic kidney failure   Butorphanol Other (See Comments)   agitation Constipation Produced a lot of urine, made patient feel crazy   Sulfa Antibiotics Rash   Bisoprolol Fumarate Other (See Comments)   Doesn't remember   Butorphanol Tartrate Other (See Comments)   Produced a lot of urine, made patient feel crazy   Codeine Nausea And Vomiting   Demerol [meperidine] Nausea And Vomiting   Imipramine    Sweating, facial dysfunction    Meperidine And Related Nausea And Vomiting   Statins    MYALGIAS   Tequin [gatifloxacin] Other (See Comments)   Caused hypoglycemia Low blood sugar   Brilinta [ticagrelor] Rash   CAUSES PETECHIAE, PURPURA   Pseudoephedrine Hcl Palpitations   Septra [sulfamethoxazole-trimethoprim] Rash   Sulfamethoxazole-trimethoprim Rash        Medication List        Accurate as of December 27, 2020 11:59 PM. If you have any questions, ask your nurse or doctor.          acetaminophen 325 MG tablet Commonly known as: TYLENOL Take 650 mg by mouth in the morning and at bedtime.   alendronate 70 MG tablet Commonly known as: FOSAMAX Take 70 mg by mouth every Sunday.   alum & mag hydroxide-simeth 790-383-33 MG/5ML suspension Commonly known as: MAALOX PLUS Take 10 mLs by mouth as needed for indigestion.   atorvastatin 10 MG tablet Commonly known as: LIPITOR TAKE 1 TABLET ONCE DAILY.   Biofreeze 10 %  Liqd Generic drug: Menthol (Topical Analgesic) Apply topically as needed.   calcium carbonate 600 MG Tabs tablet Commonly known as: OS-CAL Take 600 mg by mouth 2 (two) times daily with a meal.   clopidogrel 75 MG tablet Commonly known as: PLAVIX TAKE 1 TABLET ONCE DAILY.   COENZYME Q-10 PO Take 10 mg by mouth at bedtime.   cyanocobalamin 1000 MCG tablet Take 1,000 mcg by mouth daily.   fluticasone 50 MCG/ACT nasal spray Commonly known as: FLONASE Place 2 sprays into both nostrils daily as needed for allergies.   Glucosamine 1500 Complex Caps Take 3 capsules by mouth daily after breakfast.   isosorbide mononitrate 60 MG 24 hr tablet Commonly known as: IMDUR Take 2 tablets (120 mg total) by mouth daily.   levocetirizine 5 MG tablet Commonly known as: XYZAL TAKE 1 TABLET ONCE DAILY IN THE EVENING.   levothyroxine 75 MCG tablet Commonly known as: SYNTHROID TAKE 1 TABLET ONCE DAILY ON EMPTY STOMACH.   melatonin 1 MG Tabs tablet Take by mouth at bedtime.   metoprolol succinate 25 MG 24 hr tablet Commonly known as: TOPROL-XL Take 25 mg by mouth in the morning and at bedtime. Take with or immediately following a meal.   Myrbetriq 50 MG Tb24 tablet Generic drug: mirabegron ER TAKE 1 TABLET BY MOUTH DAILY.   nitroGLYCERIN 0.4 MG SL tablet Commonly known as: NITROSTAT Place 0.4 mg under the tongue every 5 (five) minutes as needed for chest pain. X 3 doses   nystatin powder Commonly known as: MYCOSTATIN/NYSTOP Apply 1 application topically. apply underneath abd/breast folds As Needed   pantoprazole 40 MG tablet Commonly known as: PROTONIX Take 1 tablet (40 mg total) by mouth daily.   predniSONE 5 MG tablet Commonly known as: DELTASONE Take 5 mg by mouth daily.   PSYLLIUM HUSK PO Take 0.4 g by mouth daily.  May keep at bedside   ranolazine 1000 MG SR tablet Commonly known as: RANEXA TAKE 1 TABLET BY MOUTH TWICE DAILY.   venlafaxine 75 MG tablet Commonly  known as: EFFEXOR Take 150 mg by mouth. 2 caps= 150 mg; oral  Once A Day   Vitamin D 50 MCG (2000 UT) tablet Take 2,000 Units by mouth daily.   zinc oxide 20 % ointment Apply 1 application topically as needed for irritation.        Review of Systems  Constitutional:  Negative for fatigue, fever and unexpected weight change.  HENT:  Positive for hearing loss. Negative for congestion and voice change.   Eyes:  Negative for visual disturbance.  Respiratory:  Positive for shortness of breath. Negative for cough and wheezing.        DOE is chronic  Cardiovascular:  Negative for leg swelling.  Gastrointestinal:  Negative for abdominal pain and constipation.  Genitourinary:  Negative for dysuria, frequency and urgency.       1-2x/night bathroom trips.   Musculoskeletal:  Positive for arthralgias, gait problem and myalgias.  Skin:  Negative for color change.  Neurological:  Negative for speech difficulty, weakness and headaches.  Psychiatric/Behavioral:  Positive for sleep disturbance. Negative for hallucinations. The patient is not nervous/anxious.        Sometimes not sleeping well   Immunization History  Administered Date(s) Administered   HPV Bivalent 11/24/2013, 01/17/2016   Influenza Whole 12/23/2017   Influenza, High Dose Seasonal PF 12/24/2018, 12/24/2018   Influenza-Unspecified 01/10/2013, 12/27/2014, 12/30/2016, 01/02/2020   Moderna Sars-Covid-2 Vaccination 03/25/2019, 04/22/2019, 01/30/2020, 08/20/2020, 12/10/2020   Pneumococcal Conjugate-13 04/21/2013   Pneumococcal Polysaccharide-23 02/27/1993, 03/23/2009   Tdap 04/20/2012   Zoster, Live 11/10/2010   Pertinent  Health Maintenance Due  Topic Date Due   INFLUENZA VACCINE  10/21/2020   DEXA SCAN  Completed   Fall Risk  02/10/2019 10/06/2018 08/18/2018 01/27/2018 12/07/2017  Falls in the past year? 0 $Remov'1 1 1 'PKTXwe$ Yes  Number falls in past yr: 0 0 0 1 2 or more  Injury with Fall? - 0 1 0 No   Functional Status Survey:     Vitals:   12/27/20 1124  BP: 125/68  Pulse: 70  Resp: 20  Temp: 97.6 F (36.4 C)  SpO2: 96%  Weight: 197 lb 6.4 oz (89.5 kg)  Height: $Remove'4\' 10"'eVtABjs$  (1.473 m)   Body mass index is 41.26 kg/m. Physical Exam Vitals and nursing note reviewed.  Constitutional:      Appearance: Normal appearance.  HENT:     Head: Normocephalic and atraumatic.     Mouth/Throat:     Mouth: Mucous membranes are moist.  Eyes:     Extraocular Movements: Extraocular movements intact.     Conjunctiva/sclera: Conjunctivae normal.     Pupils: Pupils are equal, round, and reactive to light.  Cardiovascular:     Rate and Rhythm: Normal rate and regular rhythm.     Heart sounds:    No gallop.  Pulmonary:     Breath sounds: No rhonchi or rales.  Abdominal:     General: Bowel sounds are normal.     Palpations: Abdomen is soft.     Tenderness: There is no abdominal tenderness.  Musculoskeletal:     Cervical back: Normal range of motion and neck supple.     Right lower leg: No edema.     Left lower leg: No edema.  Skin:    General: Skin is warm and dry.  Neurological:  General: No focal deficit present.     Mental Status: She is alert and oriented to person, place, and time. Mental status is at baseline.     Motor: No weakness.     Coordination: Coordination normal.     Gait: Gait abnormal.  Psychiatric:        Mood and Affect: Mood normal.        Behavior: Behavior normal.        Thought Content: Thought content normal.        Judgment: Judgment normal.    Labs reviewed: Recent Labs    02/23/20 0000 03/22/20 0000 08/24/20 0000  NA 142 141 139  K 4.6 3.9 4.2  CL 106 105 107  CO2 23* 29* 23*  BUN 26* 20 25*  CREATININE 1.3* 1.0 1.2*  CALCIUM 8.5* 8.7 8.3*   Recent Labs    02/23/20 0000 03/22/20 0000 08/24/20 0000  AST 10* 17 9*  ALT 6* 15 7  ALKPHOS 42 86 42  ALBUMIN 3.8 3.9 3.4*   Recent Labs    02/23/20 0000 03/22/20 0000 08/24/20 0000 09/12/20 0000  WBC 9.8 5.1 11.0  11.8  NEUTROABS 6,693.00 3,545.00 7,535.00  --   HGB 10.9* 13.4 9.9* 11.5*  HCT 34* 40 30* 36  PLT 256 170 245 306   Lab Results  Component Value Date   TSH 2.10 08/24/2020   Lab Results  Component Value Date   HGBA1C 5.1 02/14/2019   Lab Results  Component Value Date   CHOL 151 08/24/2020   HDL 57 08/24/2020   LDLCALC 70 08/24/2020   TRIG 162 (A) 08/24/2020   CHOLHDL 2.1 06/07/2018    Significant Diagnostic Results in last 30 days:  No results found.  Assessment/Plan GERD (gastroesophageal reflux disease) stable, on Pantoprazole.  Hgb 11.5 09/12/20  Depression, recurrent ([place]) Her mood is stable, on Venlafaxine $RemoveBefore'150mg'KnQihEHENkgxh$  qd. TSH 2.10 08/24/20  Generalized osteoarthritis of multiple sites takes Tylenol  Osteoporosis takes Alendronate, Ca, Vit D  Vitamin B12 deficiency anemia Vit B12 deficiency, takes Vit B12, Vit B12 162 08/03/19>>869 08/24/20, Hgb 9.9 Vit B12 869 08/24/20>>11.5 09/12/20  Polymyalgia rheumatica (HCC) takes Prednisone $RemoveBeforeDE'5mg'fgoyRKDvmEMjruD$  qd(failed GDR)  Urinary frequency  stable, on Myrbetriq.  Hypothyroidism  on Levothyroxine 34mcg qd. TSH 2.10 08/24/20  CAD (coronary artery disease) on Isosorbide and Ranolazine, Plavix. LDL 70 08/24/20  CKD (chronic kidney disease) stage 3, GFR 30-59 ml/min CKD IIIa Bun/creat 25/1.2, eGFR 44 08/24/20  Essential hypertension Blood pressure is controlled, on Metoprolol, Bun/creat 25/1.2, eGFR 44 08/24/20    Family/ staff Communication: plan of care reviewed with the patient and charge nurse.   Labs/tests ordered:  none  Time spend 40 minutes.

## 2020-12-27 NOTE — Assessment & Plan Note (Signed)
takes Tylenol    

## 2020-12-27 NOTE — Assessment & Plan Note (Signed)
stable, on Myrbetriq.

## 2020-12-30 ENCOUNTER — Encounter: Payer: Self-pay | Admitting: Nurse Practitioner

## 2021-03-21 ENCOUNTER — Non-Acute Institutional Stay (INDEPENDENT_AMBULATORY_CARE_PROVIDER_SITE_OTHER): Payer: Medicare Other | Admitting: Nurse Practitioner

## 2021-03-21 ENCOUNTER — Encounter: Payer: Self-pay | Admitting: Nurse Practitioner

## 2021-03-21 DIAGNOSIS — Z0000 Encounter for general adult medical examination without abnormal findings: Secondary | ICD-10-CM | POA: Diagnosis not present

## 2021-03-21 NOTE — Progress Notes (Signed)
Subjective:   Erin Ingram is a 80 y.o. female who presents for Medicare Annual (Subsequent) preventive examination at [place].      Objective:    Today's Vitals   03/21/21 0914  BP: 112/60  Pulse: 70  Resp: 17  Temp: (!) 97.3 F (36.3 C)  SpO2: 97%  Weight: 197 lb 6.4 oz (89.5 kg)  Height: 4' 10" (1.473 m)   Body mass index is 41.26 kg/m.  Advanced Directives 03/21/2021 12/27/2020 10/04/2020 08/26/2020 08/22/2020 10/26/2019 08/02/2019  Does Patient Have a Medical Advance Directive? _0  Yes Yes  Type of Paramedic of [place];Living will;Out of facility DNR (pink MOST or yellow form) [place];Out of facility DNR (pink MOST or yellow form) [place];Living will;Out of facility DNR (pink MOST or yellow form) [place];Living will;Out of facility DNR (pink MOST or yellow form) Living will;Out of facility DNR (pink MOST or yellow form) Out of facility DNR (pink MOST or yellow form);Healthcare Power of Attorney Living will;Out of facility DNR (pink MOST or yellow form)  Does patient want to make changes to medical advance directive? No - Patient declined No - Patient declined No - Patient declined No - Patient declined No - Patient declined No - Patient declined No - Patient declined  Copy of [place] in Chart? Yes - validated most recent copy scanned in chart (See row information) Yes - validated most recent copy scanned in chart (See row information) Yes - validated most recent copy scanned in chart (See row information) Yes - validated most recent copy scanned in chart (See row information) - - -  Would patient like information on creating a medical advance directive? - - - - - - -  Pre-existing out of facility DNR order (yellow form or pink MOST form) Pink MOST form placed in chart (order not valid for inpatient use);Yellow form placed in chart  (order not valid for inpatient use) - Yellow form placed in chart (order not valid for inpatient use);Pink MOST form placed in chart (order not valid for inpatient use) Yellow form placed in chart (order not valid for inpatient use);Pink MOST form placed in chart (order not valid for inpatient use) Pink MOST form placed in chart (order not valid for inpatient use);Yellow form placed in chart (order not valid for inpatient use) Yellow form placed in chart (order not valid for inpatient use);Pink MOST form placed in chart (order not valid for inpatient use) Pink MOST form placed in chart (order not valid for inpatient use)    Current Medications (verified) Outpatient Encounter Medications as of 03/21/2021  Medication Sig   acetaminophen (TYLENOL) 325 MG tablet Take 650 mg by mouth in the morning and at bedtime.   alendronate (FOSAMAX) 70 MG tablet Take 70 mg by mouth every Sunday.    alum & mag hydroxide-simeth (MAALOX PLUS) 400-400-40 MG/5ML suspension Take 10 mLs by mouth as needed for indigestion.   atorvastatin (LIPITOR) 10 MG tablet TAKE 1 TABLET ONCE DAILY.   calcium carbonate (OS-CAL) 600 MG TABS tablet Take 600 mg by mouth 2 (two) times daily with a meal.   Cholecalciferol (VITAMIN D) 50 MCG (2000 UT) tablet Take 2,000 Units by mouth daily.   clopidogrel (PLAVIX) 75 MG tablet TAKE 1 TABLET ONCE DAILY.   COENZYME Q-10 PO Take 10 mg by mouth at bedtime.   cyanocobalamin 1000 MCG tablet Take 1,000 mcg  by mouth daily.   fluticasone (FLONASE) 50 MCG/ACT nasal spray Place 2 sprays into both nostrils daily as needed for allergies.    Glucosamine-Chondroit-Vit C-Mn (GLUCOSAMINE 1500 COMPLEX) CAPS Take 3 capsules by mouth daily after breakfast.    isosorbide mononitrate (IMDUR) 60 MG 24 hr tablet Take 2 tablets (120 mg total) by mouth daily.   levocetirizine (XYZAL) 5 MG tablet TAKE 1 TABLET ONCE DAILY IN THE EVENING.   levothyroxine (SYNTHROID) 75 MCG tablet TAKE 1 TABLET ONCE DAILY ON EMPTY  STOMACH.   Melatonin 1 MG TABS Take by mouth at bedtime.   Menthol, Topical Analgesic, (BIOFREEZE) 10 % LIQD Apply topically as needed.   metoprolol succinate (TOPROL-XL) 25 MG 24 hr tablet Take 25 mg by mouth in the morning and at bedtime. Take with or immediately following a meal.   MYRBETRIQ 50 MG TB24 tablet TAKE 1 TABLET BY MOUTH DAILY.   nitroGLYCERIN (NITROSTAT) 0.4 MG SL tablet Place 0.4 mg under the tongue every 5 (five) minutes as needed for chest pain. X 3 doses   nystatin (MYCOSTATIN/NYSTOP) powder Apply 1 application topically. apply underneath abd/breast folds As Needed   pantoprazole (PROTONIX) 40 MG tablet Take 1 tablet (40 mg total) by mouth daily.   predniSONE (DELTASONE) 5 MG tablet Take 5 mg by mouth daily.    PSYLLIUM HUSK PO Take 0.4 g by mouth daily. May keep at bedside   ranolazine (RANEXA) 1000 MG SR tablet TAKE 1 TABLET BY MOUTH TWICE DAILY.   venlafaxine (EFFEXOR) 75 MG tablet Take 150 mg by mouth. 2 caps= 150 mg; oral  Once A Day   zinc oxide 20 % ointment Apply 1 application topically as needed for irritation.   No facility-administered encounter medications on file as of 03/21/2021.    Allergies (verified) Nsaids, Butorphanol, Sulfa antibiotics, Bisoprolol fumarate, Butorphanol tartrate, Codeine, Demerol [meperidine], Imipramine, Meperidine and related, Statins, Tequin [gatifloxacin], Brilinta [ticagrelor], Pseudoephedrine hcl, Septra [sulfamethoxazole-trimethoprim], and Sulfamethoxazole-trimethoprim   History: Past Medical History:  Diagnosis Date   Anemia    Arthritis    "fingers, back, shoulders, hips" (04/10/2016)   Chronic diastolic CHF (congestive heart failure) (HCC)    a. normal EF, LVEDP at [facility] in 6/17 33 >> Lasix started    CKD (chronic kidney disease), stage III (HCC)    Coronary artery disease    a. s/p CABG 2000 (SVG/ free LIMA Y graft to the diagonal and distal LAD, SVG to the OM 1, and SVG to the PDA) . b. Cath 12/19/2014 90% dSVG to RCA s/p  2 overlapping DES. c. LHC 2016, 2017 no intervention except diuresis needed. d. Low risk nuc 03/2016. e. Cath 12/2017 patent LIMA-LAD, SVG-OM, SVG-PDA but occluded SVG to diag.    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 L by cath note in 2016.   TIA (transient ischemic attack)    Past Surgical History:  Procedure Laterality Date   BREAST BIOPSY Right 1964   CARDIAC CATHETERIZATION  08   patent grafts, no culprit lesions, EF 65%   CARDIAC CATHETERIZATION N/A 12/19/2014   Procedure: Left Heart Cath  and Cors/Grafts Angiography;  Surgeon: Jettie Booze, MD;  Location: [place] [facility];  Service: Cardiovascular;  Laterality: N/A;   CARDIAC CATHETERIZATION N/A 12/19/2014   Procedure: Coronary Stent Intervention;  Surgeon: Jettie Booze, MD;  Location: [place] [facility];  Service: Cardiovascular;  Laterality: N/A;   CARDIAC CATHETERIZATION N/A 01/01/2015   Procedure: Left Heart Cath and Cors/Grafts Angiography;  Surgeon: Jettie Booze, MD;  Location: [place] [facility];  Service: Cardiovascular;  Laterality: N/A;   CARDIAC CATHETERIZATION N/A 09/20/2015   Procedure: Left Heart Cath and Cors/Grafts Angiography;  Surgeon: Jettie Booze, MD;  Location: [place] [facility];  Service: Cardiovascular;  Laterality: N/A;   CATARACT EXTRACTION W/ INTRAOCULAR LENS  IMPLANT, BILATERAL Bilateral 2011   COLONOSCOPY WITH PROPOFOL N/A 07/27/2016   Procedure: COLONOSCOPY WITH PROPOFOL;  Surgeon: Wilford Corner, MD;  Location: [place];  Service: Endoscopy;  Laterality: N/A;   CORONARY ARTERY BYPASS GRAFT  2000   ASCVD, multivessel, S./P.   DILATION AND CURETTAGE OF UTERUS   1980s   ESOPHAGOGASTRODUODENOSCOPY (EGD) WITH PROPOFOL N/A 07/27/2016   Procedure: ESOPHAGOGASTRODUODENOSCOPY (EGD) WITH PROPOFOL;  Surgeon: Wilford Corner, MD;  Location: [place];  Service: Endoscopy;  Laterality: N/A;   FRACTURE SURGERY     LEFT HEART CATH AND CORS/GRAFTS ANGIOGRAPHY N/A 01/12/2018   Procedure: LEFT HEART CATH AND CORS/GRAFTS ANGIOGRAPHY;  Surgeon: Jettie Booze, MD;  Location: [place] [facility];  Service: Cardiovascular;  Laterality: N/A;   PATELLA FRACTURE SURGERY Left 1993   Family History  Problem Relation Age of Onset   Heart disease Mother    Heart attack Mother    Heart disease Father    Heart attack Father    Diabetes Brother    Pulmonary embolism Brother    Stroke Paternal Grandmother    Hypertension Neg Hx    Social History   Socioeconomic History   Marital status: Married    Spouse name: Not on file   Number of children: 2   Years of education: College   Highest education level: Not on file  Occupational History   Occupation: Retired Cytogeneticist  Tobacco Use   Smoking status: Former    Packs/day: 0.50    Years: 7.00    Pack years: 3.50    Types: Cigarettes    Quit date: 08/21/1969    Years since quitting: 51.6   Smokeless tobacco: Never  Vaping Use   Vaping Use: Never used  Substance and Sexual Activity   Alcohol use: No    Alcohol/week: 0.0 standard drinks    Comment: rare   Drug use: No   Sexual activity: Never  Other Topics Concern   Not on file  Social History Narrative   Social History     Social History Narrative       Lives in [place] with husband.   Diet: Regular   Do you drink/eat things with caffeine? 1 cup caffeine per day.  Not much coffee   Marital status: Married                           What year were you married? 1964   Do you live in a house, apartment, assisted living, condo, trailer, etc)? Here at [facility]   Is it one or more stories?   How many persons live in your home? 2   Do you have any  pets in your home? No   Current  or past profession: Engineer, production (RN)   Do you exercise? I did                                                Type & how often: Swimming, Tai Chi, exercise classes   Do you have a living will? yes   Do you have a DNR Form? Yes   Do you have a POA/HPOA forms?             Social Determinants of Health   Financial Resource Strain: Not on file  Food Insecurity: Not on file  Transportation Needs: Not on file  Physical Activity: Not on file  Stress: Not on file  Social Connections: Not on file    Tobacco Counseling Counseling given: Not Answered   Clinical Intake:  Pre-visit preparation completed: Yes  Pain : No/denies pain     BMI - recorded: 41.26 Nutritional Status: BMI > 30  Obese Nutritional Risks: None Diabetes: No  How often do you need to have someone help you when you read instructions, pamphlets, or other written materials from your doctor or pharmacy?: 1 - Never What is the last grade level you completed in school?: college  Diabetic?no  Interpreter Needed?: No  Information entered by :: Electa Sterry Bretta Bang NP   Activities of Daily Living No flowsheet data found.  Patient Care Team: Rosaleen Mazer X, NP as PCP - General (Internal Medicine) Jettie Booze, MD as PCP - Cardiology (Cardiology) Wilford Corner, MD as Consulting Physician (Gastroenterology) Marlou Sa Tonna Corner, MD as Consulting Physician (Orthopedic Surgery) Marcial Pacas, MD as Consulting Physician (Neurology) Franchot Gallo, MD as Consulting Physician (Urology) Kary Kos, MD as Consulting Physician (Neurosurgery)  Indicate any recent Medical Services you may have received from other than [facility] providers in the past year (date may be approximate).     Assessment:   This is a routine wellness examination for [place].  Hearing/Vision screen No results found.  Dietary issues and exercise activities discussed:     Goals Addressed              This Visit's Progress    Maintain Mobility and Function       Evidence-based guidance:  Emphasize the importance of physical activity and aerobic exercise as included in treatment plan; assess barriers to adherence; consider patient's abilities and preferences.  Encourage gradual increase in activity or exercise instead of stopping if pain occurs.  Reinforce individual therapy exercise prescription, such as strengthening, stabilization and stretching programs.  Promote optimal body mechanics to stabilize the spine with lifting and functional activity.  Encourage activity and mobility modifications to facilitate optimal function, such as using a log roll for bed mobility or dressing from a seated position.  Reinforce individual adaptive equipment recommendations to limit excessive spinal movements, such as a Systems analyst.  Assess adequacy of sleep; encourage use of sleep hygiene techniques, such as bedtime routine; use of white noise; dark, cool bedroom; avoiding daytime naps, heavy meals or exercise before bedtime.  Promote positions and modification to optimize sleep and sexual activity; consider pillows or positioning devices to assist in maintaining neutral spine.  Explore options for applying ergonomic principles at work and home, such as frequent position changes, using ergonomically designed equipment and working at optimal height.  Promote modifications to increase comfort with driving such as lumbar support,  optimizing seat and steering wheel position, using cruise control and taking frequent rest stops to stretch and walk.   Notes:        Depression Screen PHQ 2/9 Scores 12/07/2017 03/22/2015 02/25/2015  PHQ - 2 Score 2 0 1  PHQ- 9 Score 12 - -    Fall Risk Fall Risk  02/10/2019 10/06/2018 08/18/2018 01/27/2018 12/07/2017  Falls in the past year? 0 _0 Yes  Number falls in past yr: 0 0 0 1 2 or more  Injury with Fall? - 0 1 0 No    FALL RISK PREVENTION PERTAINING TO THE  HOME:  Any stairs in or around the home? Yes  If so, are there any without handrails? No  Home free of loose throw rugs in walkways, pet beds, electrical cords, etc? Yes  Adequate lighting in your home to reduce risk of falls? Yes   ASSISTIVE DEVICES UTILIZED TO PREVENT FALLS:  Life alert? No  Use of a cane, walker or w/c? Yes  Grab bars in the bathroom? Yes  Shower chair or bench in shower? Yes  Elevated toilet seat or a handicapped toilet? Yes   TIMED UP AND GO:  Was the test performed? Yes .  Length of time to ambulate 10 feet: 10 sec.   Gait slow and steady with assistive device  Cognitive Function: MMSE - Mini Mental State Exam 06/15/2017 06/10/2017  Orientation to time 5 5  Orientation to Place 5 5  Registration 3 3  Attention/ Calculation 5 5  Recall 3 3  Language- name 2 objects 2 2  Language- repeat 1 1  Language- follow 3 step command 3 3  Language- read & follow direction 1 1  Write a sentence 1 1  Copy design 1 1  Total score 30 30        Immunizations Immunization History  Administered Date(s) Administered   HPV Bivalent 11/24/2013, 01/17/2016   Influenza Whole 12/23/2017   Influenza, High Dose Seasonal PF 12/24/2018, 12/24/2018   Influenza-Unspecified 01/10/2013, 12/27/2014, 12/30/2016, 01/02/2020, 01/09/2021   Moderna Sars-Covid-2 Vaccination 03/25/2019, 04/22/2019, 01/30/2020, 08/20/2020, 12/10/2020   Pneumococcal Conjugate-13 04/21/2013   Pneumococcal Polysaccharide-23 02/27/1993, 03/23/2009   Tdap 04/20/2012   Zoster, Live 11/10/2010    TDAP status: Up to date  Flu Vaccine status: Up to date  Pneumococcal vaccine status: Up to date  Covid-19 vaccine status: Completed vaccines  Qualifies for Shingles Vaccine? Yes   Zostavax completed Yes   Shingrix Completed?: No.    Education has been provided regarding the importance of this vaccine. Patient has been advised to call insurance company to determine out of pocket expense if they have not  yet received this vaccine. Advised may also receive vaccine at local pharmacy or Health Dept. Verbalized acceptance and understanding.  Screening Tests Health Maintenance  Topic Date Due   Zoster Vaccines- Shingrix (1 of 2) Never done   TETANUS/TDAP  04/20/2022   Pneumonia Vaccine 82+ Years old  Completed   INFLUENZA VACCINE  Completed   DEXA SCAN  Completed   COVID-19 Vaccine  Completed   HPV VACCINES  Aged Out    Health Maintenance  Health Maintenance Due  Topic Date Due   Zoster Vaccines- Shingrix (1 of 2) Never done    Colorectal cancer screening: No longer required.   Mammogram status: No longer required due to aged out.  Bone Density status: Ordered DEXA. Pt provided with contact info and advised to call to schedule appt.  Lung Cancer Screening: (  Low Dose CT Chest recommended if Age 35-80 years, 30 pack-year currently smoking OR have quit w/in 15years.) does not qualify   Hepatitis C Screening: does not qualify  Vision Screening: Recommended annual ophthalmology exams for early detection of glaucoma and other disorders of the eye. Is the patient up to date with their annual eye exam?  No  Who is the provider or what is the name of the office in which the patient attends annual eye exams? Refer if desires.  If pt is not established with a provider, would they like to be referred to a provider to establish care? No .   Dental Screening: Recommended annual dental exams for proper oral hygiene  Community Resource Referral / Chronic Care Management: CRR required this visit?  No   CCM required this visit?  No      Plan:     I have personally reviewed and noted the following in the patients chart:   Medical and social history Use of alcohol, tobacco or illicit drugs  Current medications and supplements including opioid prescriptions.  Functional ability and status Nutritional status Physical activity Advanced directives List of other  physicians Hospitalizations, surgeries, and ER visits in previous 12 months Vitals Screenings to include cognitive, depression, and falls Referrals and appointments  In addition, I have reviewed and discussed with patient certain preventive protocols, quality metrics, and best practice recommendations. A written personalized care plan for preventive services as well as general preventive health recommendations were provided to patient.   Ophthalmology f/u, DEXA, MMSE, Shingrix order provided.   Charan Prieto X Wandy Bossler, NP   03/21/2021

## 2021-03-27 ENCOUNTER — Emergency Department (HOSPITAL_COMMUNITY): Payer: Medicare Other

## 2021-03-27 ENCOUNTER — Emergency Department (HOSPITAL_COMMUNITY)
Admission: EM | Admit: 2021-03-27 | Discharge: 2021-03-28 | Disposition: A | Discharge status: Home / Self Care | Payer: Medicare Other | Attending: Emergency Medicine | Admitting: Emergency Medicine

## 2021-03-27 ENCOUNTER — Encounter (HOSPITAL_COMMUNITY): Payer: Self-pay

## 2021-03-27 DIAGNOSIS — S0990XA Unspecified injury of head, initial encounter: Secondary | ICD-10-CM | POA: Insufficient documentation

## 2021-03-27 DIAGNOSIS — W01198A Fall on same level from slipping, tripping and stumbling with subsequent striking against other object, initial encounter: Secondary | ICD-10-CM | POA: Insufficient documentation

## 2021-03-27 DIAGNOSIS — W19XXXA Unspecified fall, initial encounter: Secondary | ICD-10-CM

## 2021-03-27 DIAGNOSIS — Z79899 Other long term (current) drug therapy: Secondary | ICD-10-CM | POA: Diagnosis not present

## 2021-03-27 DIAGNOSIS — N189 Chronic kidney disease, unspecified: Secondary | ICD-10-CM | POA: Diagnosis not present

## 2021-03-27 DIAGNOSIS — Z7902 Long term (current) use of antithrombotics/antiplatelets: Secondary | ICD-10-CM | POA: Insufficient documentation

## 2021-03-27 DIAGNOSIS — I2510 Atherosclerotic heart disease of native coronary artery without angina pectoris: Secondary | ICD-10-CM | POA: Diagnosis not present

## 2021-03-27 DIAGNOSIS — I509 Heart failure, unspecified: Secondary | ICD-10-CM | POA: Diagnosis not present

## 2021-03-27 NOTE — ED Triage Notes (Signed)
[facility]. Pt fell and hit the back left side of head, swelling noted in area. Denies dizziness or LOC. Pt taking Plavix.  EMS vitals: BP 138/58 HR 62 SPO2 98% RA T 98.8

## 2021-03-27 NOTE — ED Provider Notes (Signed)
[facility] DEPT Provider Note   CSN: [id number] Arrival date & time: 03/27/21  2104     History  Chief Complaint  Patient presents with   Lytle Michaels    Erin Ingram is a 81 y.o. female.  The history is provided by the patient and medical records.  Fall Associated symptoms include headaches.   81 y.o. F with hx of HLP, depression, PMR, CKD, CHF, CAD, presenting to the ED after a fall.  Patient reports she hit the back of a wheelchair wheel with her foot causing her to loose her footing, fell onto back with impact on back of head.  No LOC.  States she does have a mild headaches and some intermittent nausea but denies dizziness, confusion, numbness, or weakness.  She has a few skin tears on her left forearm but denies other injuries.  She does take plavix, states facility sent her due to this.  She has active DNR.  Home Medications Prior to Admission medications   Medication Sig Start Date End Date Taking? Authorizing Provider  acetaminophen (TYLENOL) 325 MG tablet Take 650 mg by mouth in the morning and at bedtime.    [provider]  alendronate (FOSAMAX) 70 MG tablet Take 70 mg by mouth every Sunday.     [provider]  alum & mag hydroxide-simeth (MAALOX PLUS) 400-400-40 MG/5ML suspension Take 10 mLs by mouth as needed for indigestion.    [provider]  atorvastatin (LIPITOR) 10 MG tablet TAKE 1 TABLET ONCE DAILY. 12/12/18   Jettie Booze, MD  calcium carbonate (OS-CAL) 600 MG TABS tablet Take 600 mg by mouth 2 (two) times daily with a meal.    [provider]  Cholecalciferol (VITAMIN D) 50 MCG (2000 UT) tablet Take 2,000 Units by mouth daily.    [provider]  clopidogrel (PLAVIX) 75 MG tablet TAKE 1 TABLET ONCE DAILY. 06/16/18   Jettie Booze, MD  COENZYME Q-10 PO Take 10 mg by mouth at bedtime.    [provider]  cyanocobalamin 1000 MCG tablet Take 1,000 mcg by mouth daily.     [provider]  fluticasone (FLONASE) 50 MCG/ACT nasal spray Place 2 sprays into both nostrils daily as needed for allergies.     [provider]  Glucosamine-Chondroit-Vit C-Mn (GLUCOSAMINE 1500 COMPLEX) CAPS Take 3 capsules by mouth daily after breakfast.     [provider]  isosorbide mononitrate (IMDUR) 60 MG 24 hr tablet Take 2 tablets (120 mg total) by mouth daily. 03/21/18   Jettie Booze, MD  levocetirizine (XYZAL) 5 MG tablet TAKE 1 TABLET ONCE DAILY IN THE EVENING. 09/12/18   Mast, Man X, NP  levothyroxine (SYNTHROID) 75 MCG tablet TAKE 1 TABLET ONCE DAILY ON EMPTY STOMACH. 09/12/18   Mast, Man X, NP  Melatonin 1 MG TABS Take by mouth at bedtime.    [provider]  Menthol, Topical Analgesic, (BIOFREEZE) 10 % LIQD Apply topically as needed.    [provider]  metoprolol succinate (TOPROL-XL) 25 MG 24 hr tablet Take 25 mg by mouth in the morning and at bedtime. Take with or immediately following a meal.    [provider]  MYRBETRIQ 50 MG TB24 tablet TAKE 1 TABLET BY MOUTH DAILY. 12/12/18   Mast, Man X, NP  nitroGLYCERIN (NITROSTAT) 0.4 MG SL tablet Place 0.4 mg under the tongue every 5 (five) minutes as needed for chest pain. X 3 doses 07/19/18   [provider]  nystatin (MYCOSTATIN/NYSTOP) powder Apply 1 application topically. apply underneath abd/breast folds As Needed    [provider]  pantoprazole (PROTONIX) 40 MG tablet Take 1 tablet (40 mg total) by mouth daily. 09/02/18   Mast, Man X, NP  predniSONE (DELTASONE) 5 MG tablet Take 5 mg by mouth daily.  10/18/18   [provider]  PSYLLIUM HUSK PO Take 0.4 g by mouth daily. May keep at bedside    [provider]  ranolazine (RANEXA) 1000 MG SR tablet TAKE 1 TABLET BY MOUTH TWICE DAILY. 12/12/18   Jettie Booze, MD  venlafaxine (EFFEXOR) 75 MG tablet Take 150 mg by mouth. 2 caps= 150 mg; oral  Once A Day    [provider]   zinc oxide 20 % ointment Apply 1 application topically as needed for irritation.    [provider]      Allergies    Nsaids, Butorphanol, Sulfa antibiotics, Bisoprolol fumarate, Butorphanol tartrate, Codeine, Demerol [meperidine], Imipramine, Meperidine and related, Statins, Tequin [gatifloxacin], Brilinta [ticagrelor], Pseudoephedrine hcl, Septra [sulfamethoxazole-trimethoprim], and Sulfamethoxazole-trimethoprim    Review of Systems   Review of Systems  Skin:  Positive for wound.  Neurological:  Positive for headaches.  All other systems reviewed and are negative.  Physical Exam Updated Vital Signs BP (!) 140/55    Pulse 65    Temp 97.9 F (36.6 C) (Oral)    Resp 20    SpO2 98%   Physical Exam Vitals and nursing note reviewed.  Constitutional:      Appearance: She is well-developed.  HENT:     Head: Normocephalic and atraumatic.     Comments: Contusion left occipital scalp, no open wound or bleeding Eyes:     Conjunctiva/sclera: Conjunctivae normal.     Pupils: Pupils are equal, round, and reactive to light.  Cardiovascular:     Rate and Rhythm: Normal rate and regular rhythm.     Heart sounds: Normal heart sounds.  Pulmonary:     Effort: Pulmonary effort is normal.     Breath sounds: Normal breath sounds.  Abdominal:     General: Bowel sounds are normal.     Palpations: Abdomen is soft.  Musculoskeletal:        General: Normal range of motion.     Cervical back: Normal range of motion.     Comments: Small skin tears present-- 2 left forearm, 1 right forearm, bleeding controlled, no signs of infection  Skin:    General: Skin is warm and dry.  Neurological:     Mental Status: She is alert and oriented to person, place, and time.    ED Results / Procedures / Treatments   Labs (all labs ordered are listed, but only abnormal results are displayed) Labs Reviewed - No data to display  EKG None  Radiology CT Head Wo Contrast  Result Date:  03/27/2021 CLINICAL DATA:  Status post fall. EXAM: CT HEAD WITHOUT CONTRAST TECHNIQUE: Contiguous axial images were obtained from the base of the skull through the vertex without intravenous contrast. COMPARISON:  July 17, 2018 FINDINGS: Brain: There is mild cerebral atrophy with widening of the extra-axial spaces and ventricular dilatation. There are areas of decreased attenuation within the white matter tracts of the supratentorial brain, consistent with microvascular disease changes. Vascular: No hyperdense vessel or unexpected calcification. Skull: Normal. Negative for fracture or focal lesion. Sinuses/Orbits: No acute finding. Other: Mild scalp soft tissue swelling is seen along the posterior aspect of the vertex on the left. IMPRESSION:  1. Generalized cerebral atrophy and microvascular disease changes of the supratentorial brain. 2. No acute intracranial abnormality. Electronically Signed   By: Virgina Norfolk M.D.   On: 03/27/2021 23:04   CT Cervical Spine Wo Contrast  Result Date: 03/27/2021 CLINICAL DATA:  Status post fall. EXAM: CT CERVICAL SPINE WITHOUT CONTRAST TECHNIQUE: Multidetector CT imaging of the cervical spine was performed without intravenous contrast. Multiplanar CT image reconstructions were also generated. COMPARISON:  April 04, 2017 FINDINGS: Alignment: There is approximately 1 mm to 2 mm retrolisthesis of the C5 vertebral body on C6. Skull base and vertebrae: No acute fracture. No primary bone lesion or focal pathologic process. Soft tissues and spinal canal: No prevertebral fluid or swelling. No visible canal hematoma. Disc levels: Moderate to marked severity endplate sclerosis and moderate severity anterior osteophyte formation are seen at the levels of C4-C5, C5-C6 and C6-C7. Mild endplate sclerosis is seen throughout the remainder the cervical spine. Marked severity intervertebral disc space narrowing is seen at C5-C6 and C6-C7 with moderate severity intervertebral disc space  narrowing at C4-C5. Bilateral moderate to marked severity multilevel facet joint hypertrophy is noted. Upper chest: Negative. Other: None. IMPRESSION: 1. No acute cervical spine fracture. 2. Marked severity multilevel degenerative changes, most prominent at the levels of C4-C5, C5-C6 and C6-C7. Electronically Signed   By: Virgina Norfolk M.D.   On: 03/27/2021 23:03    Procedures Procedures    Medications Ordered in ED Medications - No data to display  ED Course/ Medical Decision Making/ A&P                           Medical Decision Making Amount and/or Complexity of Data Reviewed Independent Historian: EMS Radiology: ordered and independent interpretation performed. Decision-making details documented in ED Course.   81 y.o. F here s/p mechanical fall at living facility.  She is AAOx3 on my evaluation.  Does have some small skin tears to the arms but wounds hemostatic without signs of infection.  Has contusion to occipital scalp but no open wound/laceration.  As she is anticoagulated, CT head/neck obtained which is negative.  I have discussed results with patient.  She remains AAOx3, at baseline.  VSS.  Feel she is stable for discharge home.  Recommend OP PCP follow-up.  Return here for new concerns..  Final Clinical Impression(s) / ED Diagnoses Final diagnoses:  Fall, initial encounter  Injury of head, initial encounter    Rx / DC Orders ED Discharge Orders     None         Larene Pickett, PA-C 03/27/21 2331    Valarie Merino, MD 03/29/21 1743

## 2021-03-27 NOTE — ED Notes (Signed)
[facility] contacted, transport arranged for patient back to facility.

## 2021-03-27 NOTE — Discharge Instructions (Signed)
CT head and neck were normal today. Can take tylenol or motrin as needed for headache. Follow-up with your primary care doctor. Return here for new concerns.

## 2021-03-28 ENCOUNTER — Non-Acute Institutional Stay: Payer: Medicare Other | Admitting: Nurse Practitioner

## 2021-03-28 ENCOUNTER — Encounter: Payer: Self-pay | Admitting: Nurse Practitioner

## 2021-03-28 DIAGNOSIS — G4733 Obstructive sleep apnea (adult) (pediatric): Secondary | ICD-10-CM

## 2021-03-28 DIAGNOSIS — M353 Polymyalgia rheumatica: Secondary | ICD-10-CM

## 2021-03-28 DIAGNOSIS — N1831 Chronic kidney disease, stage 3a: Secondary | ICD-10-CM

## 2021-03-28 DIAGNOSIS — W19XXXA Unspecified fall, initial encounter: Secondary | ICD-10-CM

## 2021-03-28 DIAGNOSIS — R350 Frequency of micturition: Secondary | ICD-10-CM

## 2021-03-28 DIAGNOSIS — S0003XD Contusion of scalp, subsequent encounter: Secondary | ICD-10-CM | POA: Diagnosis not present

## 2021-03-28 DIAGNOSIS — E039 Hypothyroidism, unspecified: Secondary | ICD-10-CM

## 2021-03-28 DIAGNOSIS — M159 Polyosteoarthritis, unspecified: Secondary | ICD-10-CM

## 2021-03-28 DIAGNOSIS — M8000XA Age-related osteoporosis with current pathological fracture, unspecified site, initial encounter for fracture: Secondary | ICD-10-CM

## 2021-03-28 DIAGNOSIS — R2681 Unsteadiness on feet: Secondary | ICD-10-CM | POA: Diagnosis not present

## 2021-03-28 DIAGNOSIS — F339 Major depressive disorder, recurrent, unspecified: Secondary | ICD-10-CM

## 2021-03-28 DIAGNOSIS — I10 Essential (primary) hypertension: Secondary | ICD-10-CM | POA: Diagnosis not present

## 2021-03-28 DIAGNOSIS — S0003XA Contusion of scalp, initial encounter: Secondary | ICD-10-CM | POA: Insufficient documentation

## 2021-03-28 DIAGNOSIS — K219 Gastro-esophageal reflux disease without esophagitis: Secondary | ICD-10-CM

## 2021-03-28 DIAGNOSIS — I2510 Atherosclerotic heart disease of native coronary artery without angina pectoris: Secondary | ICD-10-CM

## 2021-03-28 DIAGNOSIS — D519 Vitamin B12 deficiency anemia, unspecified: Secondary | ICD-10-CM

## 2021-03-28 NOTE — Assessment & Plan Note (Signed)
Her mood is stable, on Venlafaxine 150mg  qd. TSH 2.10 08/24/20

## 2021-03-28 NOTE — Assessment & Plan Note (Signed)
on Levothyroxine 46mcg qd. TSH 2.10 08/24/20

## 2021-03-28 NOTE — Assessment & Plan Note (Signed)
Low Bp measurement, will reduce Metoprolol succinate to 53m qd, VS q shift x 48 hours. Bun/creat 25/1.2, eGFR 44 08/24/20

## 2021-03-28 NOTE — Assessment & Plan Note (Signed)
Stable,  takes Prednisone 5mg  qd(failed GDR)

## 2021-03-28 NOTE — Assessment & Plan Note (Signed)
stable, on Pantoprazole.  Hgb 11.5 09/12/20

## 2021-03-28 NOTE — Assessment & Plan Note (Signed)
takes Vit B12, Vit B12 162 08/03/19>>869 08/24/20. Hgb 9.9 Vit B12 869 08/24/20>>11.5 09/12/20

## 2021-03-28 NOTE — Assessment & Plan Note (Signed)
takes Tylenol    

## 2021-03-28 NOTE — Assessment & Plan Note (Signed)
Bun/creat 25/1.2, eGFR 44 08/24/20

## 2021-03-28 NOTE — Assessment & Plan Note (Signed)
uses CPAP 

## 2021-03-28 NOTE — Assessment & Plan Note (Signed)
Superior left parietal region, a marble sized, should heal.

## 2021-03-28 NOTE — Assessment & Plan Note (Signed)
stable, on Myrbetriq.

## 2021-03-28 NOTE — Assessment & Plan Note (Signed)
uses walker for ambulation.   

## 2021-03-28 NOTE — Assessment & Plan Note (Signed)
No chest pain since last seen, on Isosorbide and Ranolazine, Plavix. LDL 70 08/24/20

## 2021-03-28 NOTE — Progress Notes (Addendum)
Location:   [facility] Room Number: [place] of Service:  SNF (31) Provider:  Sheriff Rodenberg X, NP  Alistar Mcenery X, NP  Patient Care Team: Jahquez Steffler X, NP as PCP - General (Internal Medicine) Jettie Booze, MD as PCP - Cardiology (Cardiology) Wilford Corner, MD as Consulting Physician (Gastroenterology) Marlou Sa, Tonna Corner, MD as Consulting Physician (Orthopedic Surgery) Marcial Pacas, MD as Consulting Physician (Neurology) Franchot Gallo, MD as Consulting Physician (Urology) Kary Kos, MD as Consulting Physician (Neurosurgery)  Extended Emergency Contact Information Primary Emergency Contact: Merlinda Frederick Address: [address]          [place] [address] [place] Phone: [phone] Mobile Phone: [phone] Relation: Spouse Secondary Emergency Contact: [place] Mobile Phone: [phone] Relation: Son  Code Status:  DNR Goals of care: Advanced Directive information Advanced Directives 04/29/2021  Does Patient Have a Medical Advance Directive? Yes  Type of Paramedic of [place];Living will;Out of facility DNR (pink MOST or yellow form)  Does patient want to make changes to medical advance directive? No - Patient declined  Copy of [place] in Chart? Yes - validated most recent copy scanned in chart (See row information)  Would patient like information on creating a medical advance directive? -  Pre-existing out of facility DNR order (yellow form or pink MOST form) Pink MOST form placed in chart (order not valid for inpatient use);Yellow form placed in chart (order not valid for inpatient use)     Chief Complaint  Patient presents with   Follow-up    Follow up ED visit for fall.   Health Maintenance    Zoster vaccine    HPI:  Pt is a 81 y.o. female seen today for an acute visit for follow up ED evaluation for fall 03/27/21.   The patient fell 03/27/20,  stated she tripped over, fell backwards, hit her head on the floor-resulted a hematoma superior left parietal region. CT head/neck unremarkable. Denied headache, change of vision, or focal weakness today.   Gait abnormality, uses walker for ambulation.   HTN, on Metoprolol, Bun/creat 25/1.2, eGFR 44 08/24/20             CKD IIIa Bun/creat 25/1.2, eGFR 44 08/24/20             CAD, on Isosorbide and Ranolazine, Plavix. LDL 70 08/24/20             Hypothyroidism, on Levothyroxine 40mcg qd. TSH 2.10 08/24/20             Urinary frequency, stable, on Myrbetriq.             GERD, stable, on Pantoprazole.  Hgb 11.5 09/12/20             Her mood is stable, on Venlafaxine $RemoveBefore'150mg'esMmSDbQbbdYm$  qd. TSH 2.10 08/24/20             OA, takes Tylenol             OP, takes Ca, Vit D. 04/29/21 Dr. Dossie Der dc Fosamax.  05/07/21 DEXA t score -1.273             Vit B12 deficiency, takes Vit B12, Vit B12 162 08/03/19>>869 08/24/20. Hgb 9.9 Vit B12 869 08/24/20>>11.5 09/12/20             PMR, takes Prednisone $RemoveBeforeDE'5mg'TpHEizBCPDJrrdR$  qd(failed GDR)             OSA ,uses CPAP  Past Medical History:  Diagnosis Date   Anemia    Arthritis    "fingers, back, shoulders, hips" (04/10/2016)   Chronic diastolic CHF (congestive heart failure) (HCC)    a. normal EF, LV[facility]P at [facility] in 6/17 33 >> Lasix started    CKD (chronic kidney disease), stage III (HCC)    Coronary artery disease    a. s/p CABG 2000 (SVG/ free LIMA Y graft to the diagonal and distal LAD, SVG to the OM 1, and SVG to the PDA) . b. Cath 12/19/2014 90% dSVG to RCA s/p 2 overlapping DES. c. LHC 2016, 2017 no intervention except diuresis needed. d. Low risk nuc 03/2016. e. Cath 12/2017 patent LIMA-LAD, SVG-OM, SVG-PDA but occluded SVG to diag.    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 L by cath note in 2016.   TIA (transient ischemic attack)    Past Surgical History:  Procedure Laterality Date   BREAST BIOPSY Right 1964   CARDIAC CATHETERIZATION  08   patent grafts, no culprit lesions, EF 65%   CARDIAC CATHETERIZATION N/A 12/19/2014   Procedure: Left Heart Cath and Cors/Grafts Angiography;  Surgeon: Jettie Booze, MD;  Location: [place] CV LAB;  Service: Cardiovascular;  Laterality: N/A;   CARDIAC CATHETERIZATION N/A 12/19/2014   Procedure: Coronary Stent Intervention;  Surgeon: Jettie Booze, MD;  Location: [place] [facility];  Service: Cardiovascular;  Laterality: N/A;   CARDIAC CATHETERIZATION N/A 01/01/2015   Procedure: Left Heart Cath and Cors/Grafts Angiography;  Surgeon: Jettie Booze, MD;  Location: [place] [facility];  Service: Cardiovascular;  Laterality: N/A;   CARDIAC CATHETERIZATION N/A 09/20/2015   Procedure: Left Heart Cath and Cors/Grafts Angiography;  Surgeon: Jettie Booze, MD;  Location: [facility] [facility];  Service: Cardiovascular;  Laterality: N/A;   CATARACT EXTRACTION W/ INTRAOCULAR LENS  IMPLANT, BILATERAL Bilateral 2011   COLONOSCOPY WITH PROPOFOL N/A 07/27/2016   Procedure: COLONOSCOPY WITH PROPOFOL;  Surgeon: Wilford Corner, MD;  Location: [facility];  Service: Endoscopy;  Laterality: N/A;   CORONARY ARTERY BYPASS GRAFT  2000   ASCVD, multivessel, S./P.   DILATION AND CURETTAGE OF UTERUS  1980s   ESOPHAGOGASTRODUODENOSCOPY (EGD) WITH PROPOFOL N/A 07/27/2016   Procedure: ESOPHAGOGASTRODUODENOSCOPY (EGD) WITH PROPOFOL;  Surgeon: Wilford Corner, MD;  Location: [facility];  Service: Endoscopy;  Laterality: N/A;   FRACTURE SURGERY     LEFT HEART CATH AND CORS/GRAFTS ANGIOGRAPHY N/A 01/12/2018   Procedure: LEFT HEART CATH AND CORS/GRAFTS ANGIOGRAPHY;  Surgeon: Jettie Booze, MD;  Location: [place] [facility];  Service: Cardiovascular;  Laterality: N/A;   PATELLA FRACTURE SURGERY Left 1993    Allergies  Allergen Reactions   Nsaids Other (See Comments)    Due to chronic kidney failure   Butorphanol Other (See Comments)    agitation Constipation Produced a lot of urine, made patient feel crazy   Sulfa Antibiotics Rash   Bisoprolol Fumarate Other (See Comments)    Doesn't remember   Butorphanol Tartrate Other (See Comments)    Produced a lot of urine, made patient feel crazy  Codeine Nausea And Vomiting   Demerol [Meperidine] Nausea And Vomiting   Imipramine     Sweating, facial dysfunction    Meperidine And Related Nausea And Vomiting   Statins     MYALGIAS   Tequin [Gatifloxacin] Other (See Comments)    Caused hypoglycemia Low blood sugar   Brilinta [Ticagrelor] Rash    CAUSES PETECHIAE, PURPURA   Pseudoephedrine Hcl Palpitations   Septra [Sulfamethoxazole-Trimethoprim] Rash   Sulfamethoxazole-Trimethoprim Rash    Allergies as of 03/28/2021       Reactions   Nsaids Other (See Comments)   Due to chronic kidney failure   Butorphanol Other (See Comments)   agitation Constipation Produced a lot of urine, made patient feel crazy   Sulfa Antibiotics Rash   Bisoprolol Fumarate Other (See Comments)   Doesn't remember   Butorphanol Tartrate Other (See Comments)   Produced a lot of urine, made patient feel crazy   Codeine Nausea And Vomiting   Demerol [meperidine] Nausea And Vomiting   Imipramine    Sweating, facial dysfunction    Meperidine And Related Nausea And Vomiting   Statins    MYALGIAS   Tequin [gatifloxacin] Other (See Comments)   Caused hypoglycemia Low blood sugar   Brilinta [ticagrelor] Rash   CAUSES PETECHIAE, PURPURA   Pseudoephedrine Hcl Palpitations   Septra [sulfamethoxazole-trimethoprim] Rash   Sulfamethoxazole-trimethoprim Rash        Medication List        Accurate as of March 28, 2021 11:59 PM. If you have  any questions, ask your nurse or doctor.          acetaminophen 325 MG tablet Commonly known as: TYLENOL Take 650 mg by mouth in the morning and at bedtime.   alendronate 70 MG tablet Commonly known as: FOSAMAX Take 70 mg by mouth every Sunday.   alum & mag hydroxide-simeth 440-347-42 MG/5ML suspension Commonly known as: MAALOX PLUS Take 10 mLs by mouth as needed for indigestion.   atorvastatin 10 MG tablet Commonly known as: LIPITOR TAKE 1 TABLET ONCE DAILY.   Biofreeze 10 % Liqd Generic drug: Menthol (Topical Analgesic) Apply topically as needed.   calcium carbonate 600 MG Tabs tablet Commonly known as: OS-CAL Take 600 mg by mouth 2 (two) times daily with a meal.   clopidogrel 75 MG tablet Commonly known as: PLAVIX TAKE 1 TABLET ONCE DAILY.   COENZYME Q-10 PO Take 10 mg by mouth at bedtime.   cyanocobalamin 1000 MCG tablet Take 1,000 mcg by mouth daily.   fluticasone 50 MCG/ACT nasal spray Commonly known as: FLONASE Place 2 sprays into both nostrils daily as needed for allergies.   Glucosamine 1500 Complex Caps Take 3 capsules by mouth daily after breakfast.   isosorbide mononitrate 60 MG 24 hr tablet Commonly known as: IMDUR Take 2 tablets (120 mg total) by mouth daily.   levocetirizine 5 MG tablet Commonly known as: XYZAL TAKE 1 TABLET ONCE DAILY IN THE EVENING.   levothyroxine 75 MCG tablet Commonly known as: SYNTHROID TAKE 1 TABLET ONCE DAILY ON EMPTY STOMACH.   melatonin 1 MG Tabs tablet Take by mouth at bedtime.   metoprolol succinate 25 MG 24 hr tablet Commonly known as: TOPROL-XL Take 25 mg by mouth in the morning and at bedtime. Take with or immediately following a meal.   Myrbetriq 50 MG Tb24 tablet Generic drug: mirabegron ER TAKE 1 TABLET BY MOUTH DAILY.   nitroGLYCERIN 0.4 MG SL tablet Commonly known as: NITROSTAT Place 0.4 mg under the  tongue every 5 (five) minutes as needed for chest pain. X 3 doses   nystatin powder Commonly  known as: MYCOSTATIN/NYSTOP Apply 1 application topically. apply underneath abd/breast folds As Needed   pantoprazole 40 MG tablet Commonly known as: PROTONIX Take 1 tablet (40 mg total) by mouth daily.   predniSONE 5 MG tablet Commonly known as: DELTASONE Take 5 mg by mouth daily.   PSYLLIUM HUSK PO Take 0.4 g by mouth daily. May keep at bedside   ranolazine 1000 MG SR tablet Commonly known as: RANEXA TAKE 1 TABLET BY MOUTH TWICE DAILY.   venlafaxine 75 MG tablet Commonly known as: EFFEXOR Take 150 mg by mouth. 2 caps= 150 mg; oral  Once A Day   Vitamin D 50 MCG (2000 UT) tablet Take 2,000 Units by mouth daily.   zinc oxide 20 % ointment Apply 1 application topically as needed for irritation.        Review of Systems  Constitutional:  Negative for activity change, appetite change and fever.  HENT:  Positive for hearing loss. Negative for congestion and voice change.   Eyes:  Negative for visual disturbance.  Respiratory:  Positive for shortness of breath. Negative for cough and wheezing.        DOE is chronic  Cardiovascular:  Negative for leg swelling.  Gastrointestinal:  Negative for abdominal pain and constipation.  Genitourinary:  Negative for dysuria, frequency and urgency.       1-2x/night bathroom trips.   Musculoskeletal:  Positive for arthralgias, gait problem and myalgias.  Skin:  Positive for wound.  Neurological:  Negative for speech difficulty, weakness and headaches.  Psychiatric/Behavioral:  Positive for sleep disturbance. Negative for hallucinations. The patient is not nervous/anxious.        Sometimes not sleeping well   Immunization History  Administered Date(s) Administered   HPV Bivalent 11/24/2013, 01/17/2016   Influenza Whole 12/23/2017   Influenza, High Dose Seasonal PF 12/24/2018, 12/24/2018   Influenza-Unspecified 01/10/2013, 12/27/2014, 12/30/2016, 01/02/2020, 01/09/2021   Moderna Sars-Covid-2 Vaccination 03/25/2019, 04/22/2019,  01/30/2020, 08/20/2020, 12/10/2020   Pneumococcal Conjugate-13 04/21/2013   Pneumococcal Polysaccharide-23 02/27/1993, 03/23/2009   Tdap 04/20/2012   Zoster Recombinat (Shingrix) 03/29/2021   Zoster, Live 11/10/2010   Pertinent  Health Maintenance Due  Topic Date Due   INFLUENZA VACCINE  Completed   DEXA SCAN  Completed   Fall Risk 10/14/2018 10/14/2018 10/15/2018 02/10/2019 03/27/2021  Falls in the past year? - - - 0 -  Was there an injury with Fall? - - - - -  Fall Risk Category Calculator - - - - -  Fall Risk Category - - - - -  Patient Fall Risk Level High fall risk High fall risk Moderate fall risk - Moderate fall risk   Functional Status Survey:    Vitals:   03/28/21 1148  BP: (!) 115/50  Pulse: 65  Resp: 20  Temp: 98.2 F (36.8 C)  SpO2: 96%  Weight: 196 lb 3.2 oz (89 kg)  Height: $Remove'4\' 10"'zzBYIhy$  (1.473 m)   Body mass index is 41.01 kg/m. Physical Exam Vitals and nursing note reviewed.  Constitutional:      Appearance: Normal appearance.  HENT:     Head: Normocephalic and atraumatic.     Mouth/Throat:     Mouth: Mucous membranes are moist.  Eyes:     Extraocular Movements: Extraocular movements intact.     Conjunctiva/sclera: Conjunctivae normal.     Pupils: Pupils are equal, round, and reactive to light.  Cardiovascular:  Rate and Rhythm: Normal rate and regular rhythm.     Heart sounds:    No gallop.  Pulmonary:     Breath sounds: No rhonchi or rales.  Abdominal:     General: Bowel sounds are normal.     Palpations: Abdomen is soft.     Tenderness: There is no abdominal tenderness.  Musculoskeletal:     Cervical back: Normal range of motion and neck supple.     Right lower leg: No edema.     Left lower leg: No edema.  Skin:    General: Skin is warm and dry.     Findings: Bruising present.     Comments: Multiple skin tears R+L arms, steri strips closures intact, a marble sized scalp hematoma superior left parietal area.   Neurological:      General: No focal deficit present.     Mental Status: She is alert and oriented to person, place, and time. Mental status is at baseline.     Motor: No weakness.     Coordination: Coordination normal.     Gait: Gait abnormal.  Psychiatric:        Mood and Affect: Mood normal.        Behavior: Behavior normal.        Thought Content: Thought content normal.        Judgment: Judgment normal.    Labs reviewed: Recent Labs    08/24/20 0000 04/01/21 0000  NA 139 141  K 4.2 4.8  CL 107 106  CO2 23* 27*  BUN 25* 24*  CREATININE 1.2* 1.2*  CALCIUM 8.3* 8.6*   Recent Labs    08/24/20 0000 04/01/21 0000  AST 9* 10*  ALT 7 6*  ALKPHOS 42 39  ALBUMIN 3.4* 3.4*   Recent Labs    08/24/20 0000 09/12/20 0000 04/01/21 0000  WBC 11.0 11.8 8.8  NEUTROABS 7,535.00  --  5,315.00  HGB 9.9* 11.5* 9.8*  HCT 30* 36 30*  PLT 245 306 238   Lab Results  Component Value Date   TSH 3.56 04/01/2021   Lab Results  Component Value Date   HGBA1C 5.1 02/14/2019   Lab Results  Component Value Date   CHOL 151 08/24/2020   HDL 57 08/24/2020   LDLCALC 70 08/24/2020   TRIG 162 (A) 08/24/2020   CHOLHDL 2.1 06/07/2018    Significant Diagnostic Results in last 30 days:  No results found.  Assessment/Plan Fall fell 03/27/20, stated she tripped over, fell backwards, hit her head on the floor-resulted a hematoma superior left parietal region. CT head/neck unremarkable. Denied headache, change of vision, or focal weakness today.  Therapy to eval and tx. Loose Bp control. Update CBC/diff, CMP/eGFR, TSH  Scalp hematoma Superior left parietal region, a marble sized, should heal.   Gait instability uses walker for ambulation.   Essential hypertension Low Bp measurement, will reduce Metoprolol succinate to $RemoveBefo'25mg'zRASvQDyqQe$  qd, VS q shift x 48 hours. Bun/creat 25/1.2, eGFR 44 08/24/20  CKD (chronic kidney disease) stage 3, GFR 30-59 ml/min (HCC) Bun/creat 25/1.2, eGFR 44 08/24/20  CAD (coronary artery  disease) No chest pain since last seen, on Isosorbide and Ranolazine, Plavix. LDL 70 08/24/20  Hypothyroidism  on Levothyroxine 56mcg qd. TSH 2.10 08/24/20  Urinary frequency stable, on Myrbetriq.  GERD (gastroesophageal reflux disease) stable, on Pantoprazole.  Hgb 11.5 09/12/20  Depression, recurrent ([place]) Her mood is stable, on Venlafaxine $RemoveBefore'150mg'jFgMasouNAhxf$  qd. 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 Pending DEXA 04/29/21 Dr. Dossie Der dc Fosamax.  05/07/21 DEXA t score -1.273  Vitamin B12 deficiency anemia  takes Vit B12, Vit B12 162 08/03/19>>869 08/24/20. Hgb 9.9 Vit B12 869 08/24/20>>11.5 09/12/20  Polymyalgia rheumatica ([place]) Stable,  takes Prednisone $RemoveBeforeDE'5mg'pUEtGGjZFoMhrJL$  qd(failed GDR)  Sleep apnea uses CPAP     Family/ staff Communication: plan of care reviewed with the patient and charge nurse.   Labs/tests ordered: CBC/diff, CMP/eGFR, TSH  Time spend 40 minutes.

## 2021-03-28 NOTE — Assessment & Plan Note (Addendum)
fell 03/27/20, stated she tripped over, fell backwards, hit her head on the floor-resulted a hematoma superior left parietal region. CT head/neck unremarkable. Denied headache, change of vision, or focal weakness today.  Therapy to eval and tx. Loose Bp control. Update CBC/diff, CMP/eGFR, TSH

## 2021-03-28 NOTE — Assessment & Plan Note (Addendum)
takes Alendronate, Ca, Vit D. Pending DEXA 04/29/21 Dr. Dossie Der dc Fosamax.  05/07/21 DEXA t score -1.273

## 2021-03-31 ENCOUNTER — Encounter: Payer: Self-pay | Admitting: Nurse Practitioner

## 2021-04-01 LAB — BASIC METABOLIC PANEL
BUN: 24 — AB (ref 4–21)
CO2: 27 — AB (ref 13–22)
Chloride: 106 (ref 99–108)
Creatinine: 1.2 — AB (ref 0.5–1.1)
Glucose: 83
Potassium: 4.8 (ref 3.4–5.3)
Sodium: 141 (ref 137–147)

## 2021-04-01 LAB — HEPATIC FUNCTION PANEL
ALT: 6 — AB (ref 7–35)
AST: 10 — AB (ref 13–35)
Alkaline Phosphatase: 39 (ref 25–125)
Bilirubin, Total: 0.2

## 2021-04-01 LAB — CBC AND DIFFERENTIAL
HCT: 30 — AB (ref 36–46)
Hemoglobin: 9.8 — AB (ref 12.0–16.0)
Neutrophils Absolute: 5315
Platelets: 238 (ref 150–399)
WBC: 8.8

## 2021-04-01 LAB — COMPREHENSIVE METABOLIC PANEL
Albumin: 3.4 — AB (ref 3.5–5.0)
Calcium: 8.6 — AB (ref 8.7–10.7)
Globulin: 1.9

## 2021-04-01 LAB — TSH: TSH: 3.56 (ref 0.41–5.90)

## 2021-04-01 LAB — CBC: RBC: 3.16 — AB (ref 3.87–5.11)

## 2021-04-03 NOTE — Addendum Note (Signed)
Addended by: Dorothea Ogle X on: 04/03/2021 09:48 AM   Modules accepted: Level of Service

## 2021-04-04 ENCOUNTER — Telehealth: Payer: Self-pay

## 2021-04-04 NOTE — Telephone Encounter (Signed)
Judeen Hammans, receptionist at [facility] called for patient stating that patient stated that she is suppose to get ID# and fax #from you.   I tried calling patient to get more information,but phone went straight to voicemail. I left message for patient to call office.  Please advise  Message routed to Man X,no

## 2021-04-06 NOTE — Progress Notes (Signed)
Cardiology Office Note   Date:  04/08/2021   ID:  Erin Ingram, DOB 1940-07-10, MRN [id number]  PCP:  Mast, Man X, NP    Chief Complaint  Patient presents with   Follow-up   CAD  Wt Readings from Last 3 Encounters:  04/08/21 197 lb (89.4 kg)  03/28/21 196 lb 3.2 oz (89 kg)  03/21/21 197 lb 6.4 oz (89.5 kg)       History of Present Illness: Erin Ingram is a 81 y.o. female   with history of CAD (CABG 2000, 2 DES to SVG - RCA 11/2014), polymyalgia rheumatica on steroids, TIA, OSA on CPAP, hypothyroidism, HTN, HLD, CKD stage III, left subclavian artery stenosis and chronic diastolic heart failure who presents for post-hospital follow-up.   She has history of 2000 with SVG/free LIMA Y graft to the diagonal and distal LAD, SVG to OM1 and SVG to PDA. She underwent 2 DES to SVG to RCA in September 2016. Cath in 2017 showed stable anatomy with occluded SVG-diag and she had a low risk myoview back in 2018. On 01/11/18 she recently presented to the hospital with a weeks' worth of intermittent chest pain, left arm pain and back pain, mostly at night when at rest. She was admitted with troponins cycled and negative x3. Given her symptoms she was set up for cardiac cath noted above with patent LIMA-LAD, SVG-OM, SVG-PDA but occluded SVG to diag. Normal LVEDP and EF noted at 55-65%. The recommendation was to continue with medical therapy at this time.   The patient previously stopped ASA due to easy bruising. The diagnosis of L subclavian stenosis comes from cath report 11/2014 indicated Left subclavian stenosis prevented left radial access to the aorta. Carotid duplex 2017 showed no significant findings.    Repeat shoulder pain in 2019 showed: Prox LAD to Mid LAD lesion is 25% stenosed. Ost Cx to Dist Cx lesion is 90% stenosed. SVG to OM is widely patent. Mid RCA lesion is 90% stenosed. SVG to PDA is patent. Patent stents in distal graft. SVG to diagonal is occluded. Mid LAD lesion is  90% stenosed. LIMA to LAD is patent. The left ventricular systolic function is normal. LV end diastolic pressure is normal. LVEDP 10 mm Hg. The left ventricular ejection fraction is 55-65% by visual estimate. There is no aortic valve stenosis.   Pain in shoulder reoslved with PT.   In the past, it was noted, "She does not exercise.  SHe has some DOE.  She has chronic fatigue.  She uses CPAP."   She was hospitalized in 07/2018.  SHe got lightheaded.  SHe nearly passed out.  BP meds were decreased.  Seen in ER for fall in January 2023.  CT head negative. Active DNR order noted.  Done well since her fall 11 days ago: Denies : Chest pain. Dizziness. Leg edema. Nitroglycerin use. Orthopnea. Palpitations. Paroxysmal nocturnal dyspnea. Shortness of breath. Syncope.    No blood in stool.  TOlerating Plavix. Hbg in Jan 2023 wqas 9.8.  SHe reports negative hemoccult.    Past Medical History:  Diagnosis Date   Anemia    Arthritis    "fingers, back, shoulders, hips" (04/10/2016)   Chronic diastolic CHF (congestive heart failure) (HCC)    a. normal EF, LVEDP at [facility] in 6/17 33 >> Lasix started    CKD (chronic kidney disease), stage III ([place])    Coronary artery disease    a. s/p CABG 2000 (SVG/ free LIMA Y graft  to the diagonal and distal LAD, SVG to the OM 1, and SVG to the PDA) . b. Cath 12/19/2014 90% dSVG to RCA s/p 2 overlapping DES. c. LHC 2016, 2017 no intervention except diuresis needed. d. Low risk nuc 03/2016. e. Cath 12/2017 patent LIMA-LAD, SVG-OM, SVG-PDA but occluded SVG to diag.    Depression    with anxious component   GERD (gastroesophageal reflux disease)    History of hiatal hernia    Hyperlipidemia    Hypothyroidism    Obesity    OSA on CPAP    PMR (polymyalgia rheumatica) (HCC)    Pneumonia    "2-3 times" (04/10/2016)   Stable angina (HCC)    microvascular, improved with Ranexa   Subclavian artery stenosis ([place])    a. L by cath note in 2016.   TIA (transient ischemic  attack)     Past Surgical History:  Procedure Laterality Date   BREAST BIOPSY Right 1964   CARDIAC CATHETERIZATION  08   patent grafts, no culprit lesions, EF 65%   CARDIAC CATHETERIZATION N/A 12/19/2014   Procedure: Left Heart Cath and Cors/Grafts Angiography;  Surgeon: Jettie Booze, MD;  Location: [place] [facility];  Service: Cardiovascular;  Laterality: N/A;   CARDIAC CATHETERIZATION N/A 12/19/2014   Procedure: Coronary Stent Intervention;  Surgeon: Jettie Booze, MD;  Location: [place] [facility];  Service: Cardiovascular;  Laterality: N/A;   CARDIAC CATHETERIZATION N/A 01/01/2015   Procedure: Left Heart Cath and Cors/Grafts Angiography;  Surgeon: Jettie Booze, MD;  Location: [place] [facility];  Service: Cardiovascular;  Laterality: N/A;   CARDIAC CATHETERIZATION N/A 09/20/2015   Procedure: Left Heart Cath and Cors/Grafts Angiography;  Surgeon: Jettie Booze, MD;  Location: [place] [facility];  Service: Cardiovascular;  Laterality: N/A;   CATARACT EXTRACTION W/ INTRAOCULAR LENS  IMPLANT, BILATERAL Bilateral 2011   COLONOSCOPY WITH PROPOFOL N/A 07/27/2016   Procedure: COLONOSCOPY WITH PROPOFOL;  Surgeon: Wilford Corner, MD;  Location: [place];  Service: Endoscopy;  Laterality: N/A;   CORONARY ARTERY BYPASS GRAFT  2000   ASCVD, multivessel, S./P.   DILATION AND CURETTAGE OF UTERUS  1980s   ESOPHAGOGASTRODUODENOSCOPY (EGD) WITH PROPOFOL N/A 07/27/2016   Procedure: ESOPHAGOGASTRODUODENOSCOPY (EGD) WITH PROPOFOL;  Surgeon: Wilford Corner, MD;  Location: [place];  Service: Endoscopy;  Laterality: N/A;   FRACTURE SURGERY     LEFT HEART CATH AND CORS/GRAFTS ANGIOGRAPHY N/A 01/12/2018   Procedure: LEFT HEART CATH AND CORS/GRAFTS ANGIOGRAPHY;  Surgeon: Jettie Booze, MD;  Location: [place] [facility];  Service: Cardiovascular;  Laterality: N/A;   PATELLA FRACTURE SURGERY Left 1993     Current Outpatient Medications  Medication Sig Dispense Refill    acetaminophen (TYLENOL) 325 MG tablet Take 650 mg by mouth in the morning and at bedtime.     alendronate (FOSAMAX) 70 MG tablet Take 70 mg by mouth every Sunday.      alum & mag hydroxide-simeth (MAALOX PLUS) 400-400-40 MG/5ML suspension Take 10 mLs by mouth as needed for indigestion.     atorvastatin (LIPITOR) 10 MG tablet TAKE 1 TABLET ONCE DAILY. 90 tablet 2   calcium carbonate (OS-CAL) 600 MG TABS tablet Take 600 mg by mouth 2 (two) times daily with a meal.     Cholecalciferol (VITAMIN D) 50 MCG (2000 UT) tablet Take 2,000 Units by mouth daily.     clopidogrel (PLAVIX) 75 MG tablet TAKE 1 TABLET ONCE DAILY. 90 tablet 3   COENZYME Q-10 PO Take 10  mg by mouth at bedtime.     cyanocobalamin 1000 MCG tablet Take 1,000 mcg by mouth daily.     fluticasone (FLONASE) 50 MCG/ACT nasal spray Place 2 sprays into both nostrils daily as needed for allergies.      Glucosamine-Chondroit-Vit C-Mn (GLUCOSAMINE 1500 COMPLEX) CAPS Take 3 capsules by mouth daily after breakfast.      isosorbide mononitrate (IMDUR) 60 MG 24 hr tablet Take 2 tablets (120 mg total) by mouth daily. 180 tablet 3   levocetirizine (XYZAL) 5 MG tablet TAKE 1 TABLET ONCE DAILY IN THE EVENING. 90 tablet 1   levothyroxine (SYNTHROID) 75 MCG tablet TAKE 1 TABLET ONCE DAILY ON EMPTY STOMACH. 90 tablet 1   loperamide (IMODIUM A-D) 2 MG tablet Take 4 mg by mouth 2 (two) times daily.     Melatonin 1 MG TABS Take by mouth at bedtime.     Menthol, Topical Analgesic, (BIOFREEZE) 10 % LIQD Apply topically as needed.     metoprolol succinate (TOPROL-XL) 25 MG 24 hr tablet Take 25 mg by mouth in the morning and at bedtime. Take with or immediately following a meal.     MYRBETRIQ 50 MG TB24 tablet TAKE 1 TABLET BY MOUTH DAILY. 90 tablet 0   nitroGLYCERIN (NITROSTAT) 0.4 MG SL tablet Place 0.4 mg under the tongue every 5 (five) minutes as needed for chest pain. X 3 doses     nystatin (MYCOSTATIN/NYSTOP) powder Apply 1 application topically. apply  underneath abd/breast folds As Needed     pantoprazole (PROTONIX) 40 MG tablet Take 1 tablet (40 mg total) by mouth daily. 90 tablet 3   predniSONE (DELTASONE) 5 MG tablet Take 5 mg by mouth daily.      PSYLLIUM HUSK PO Take 0.4 g by mouth daily. May keep at bedside     ranolazine (RANEXA) 1000 MG SR tablet TAKE 1 TABLET BY MOUTH TWICE DAILY. 180 tablet 2   venlafaxine (EFFEXOR) 75 MG tablet Take 150 mg by mouth. 2 caps= 150 mg; oral  Once A Day     zinc oxide 20 % ointment Apply 1 application topically as needed for irritation.     No current facility-administered medications for this visit.    Allergies:   Nsaids, Butorphanol, Sulfa antibiotics, Bisoprolol fumarate, Butorphanol tartrate, Codeine, Demerol [meperidine], Imipramine, Meperidine and related, Statins, Tequin [gatifloxacin], Brilinta [ticagrelor], Pseudoephedrine hcl, Septra [sulfamethoxazole-trimethoprim], and Sulfamethoxazole-trimethoprim    Social History:  The patient  reports that she quit smoking about 51 years ago. Her smoking use included cigarettes. She has a 3.50 pack-year smoking history. She has never used smokeless tobacco. She reports that she does not drink alcohol and does not use drugs.   Family History:  The patient's family history includes Diabetes in her brother; Heart attack in her father and mother; Heart disease in her father and mother; Pulmonary embolism in her brother; Stroke in her paternal grandmother.    ROS:  Please see the history of present illness.   Otherwise, review of systems are positive for recent anemia.   All other systems are reviewed and negative.    PHYSICAL EXAM: VS:  BP (!) 134/56    Pulse 78    Ht 4\' 10"  (1.473 m)    Wt 197 lb (89.4 kg)    SpO2 95%    BMI 41.17 kg/m  , BMI Body mass index is 41.17 kg/m. GEN: Well nourished, well developed, in no acute distress HEENT: normal Neck: no JVD, carotid bruits, or masses Cardiac: RRR;  no murmurs, rubs, or gallops,no edema   Respiratory:  clear to auscultation bilaterally, normal work of breathing GI: soft, nontender, nondistended, + BS, obese MS: no deformity or atrophy Skin: warm and dry, no rash Neuro:  Strength and sensation are intact Psych: euthymic mood, full affect   EKG:   The ekg ordered today demonstrates NSR, no ST changes   Recent Labs: 08/24/2020: ALT 7; BUN 25; Creatinine 1.2; Potassium 4.2; Sodium 139; TSH 2.10 09/12/2020: Hemoglobin 11.5; Platelets 306   Lipid Panel    Component Value Date/Time   CHOL 151 08/24/2020 0000   TRIG 162 (A) 08/24/2020 0000   HDL 57 08/24/2020 0000   CHOLHDL 2.1 06/07/2018 0710   VLDL 40 01/12/2018 0549   LDLCALC 70 08/24/2020 0000   LDLCALC 65 06/07/2018 0710     Other studies Reviewed: Additional studies/ records that were reviewed today with results demonstrating: LDL 70 in June 2022.  Cr 1.2.   ASSESSMENT AND PLAN:  CAD: No angina on medical therapy. Normal LVEF in 2020.  Currently on clopidogrel monotherapy.  If a source of bleeding is found, may need to hold clopidogrel.  She does not report any kind of bleeding at this time. Anemia: No black , tarry stool.  No BRBPR.  Anemia can contribute to shortness of breath.  She will need CBC followed with her primary care doctor. Chronic diastolic heart failure: Appears euvolemic.  HTN: The current medical regimen is effective;  continue present plan and medications. Hyperlipidemia: COntinue atorvastatin.  WOuld defer high potency statin at this point as she has had myalgias with higher dose statins in the past. Morbid obesity: High-fiber diet.  Whole food, plant-based diet.  Avoid processed foods. PAD: left subclavian artery stenosis. Brisk left radial pulse. No syncope.    Current medicines are reviewed at length with the patient today.  The patient concerns regarding her medicines were addressed.  The following changes have been made:  No change  Labs/ tests ordered today include:  No orders of  the defined types were placed in this encounter.   Recommend 150 minutes/week of aerobic exercise Low fat, low carb, high fiber diet recommended  Disposition:   FU in 1 year   Signed, Larae Grooms, MD  04/08/2021 3:36 PM    [facility] [place], [place], [place]  [address] Phone: [phone]; Fax: [phone]

## 2021-04-08 ENCOUNTER — Encounter: Payer: Self-pay | Admitting: Interventional Cardiology

## 2021-04-08 ENCOUNTER — Other Ambulatory Visit: Payer: Self-pay

## 2021-04-08 ENCOUNTER — Ambulatory Visit (INDEPENDENT_AMBULATORY_CARE_PROVIDER_SITE_OTHER): Payer: Medicare Other | Admitting: Interventional Cardiology

## 2021-04-08 VITALS — BP 134/56 | HR 78 | Ht <= 58 in | Wt 197.0 lb

## 2021-04-08 DIAGNOSIS — I771 Stricture of artery: Secondary | ICD-10-CM

## 2021-04-08 DIAGNOSIS — I5032 Chronic diastolic (congestive) heart failure: Secondary | ICD-10-CM | POA: Diagnosis not present

## 2021-04-08 DIAGNOSIS — I10 Essential (primary) hypertension: Secondary | ICD-10-CM | POA: Diagnosis not present

## 2021-04-08 DIAGNOSIS — I2510 Atherosclerotic heart disease of native coronary artery without angina pectoris: Secondary | ICD-10-CM

## 2021-04-08 DIAGNOSIS — E785 Hyperlipidemia, unspecified: Secondary | ICD-10-CM

## 2021-04-08 NOTE — Patient Instructions (Signed)

## 2021-04-15 NOTE — Telephone Encounter (Signed)
Late documentation: patient called back. She was given fax number.

## 2021-04-16 ENCOUNTER — Telehealth: Payer: Self-pay | Admitting: *Deleted

## 2021-04-16 NOTE — Telephone Encounter (Signed)
Danielle with [facility] called and left message on Clinical intake stating that she needs to get CPAP Supplies for patient.   Called and LMOM with Andee Poles that patient was a [facility] patient. Instructed her to call the facility to obtain those orders or she could fax orders over for [facility] to sign.

## 2021-04-29 ENCOUNTER — Encounter: Payer: Self-pay | Admitting: Internal Medicine

## 2021-04-29 ENCOUNTER — Non-Acute Institutional Stay: Payer: Medicare Other | Admitting: Internal Medicine

## 2021-04-29 DIAGNOSIS — K219 Gastro-esophageal reflux disease without esophagitis: Secondary | ICD-10-CM

## 2021-04-29 DIAGNOSIS — G4733 Obstructive sleep apnea (adult) (pediatric): Secondary | ICD-10-CM

## 2021-04-29 DIAGNOSIS — N1831 Chronic kidney disease, stage 3a: Secondary | ICD-10-CM | POA: Diagnosis not present

## 2021-04-29 DIAGNOSIS — M353 Polymyalgia rheumatica: Secondary | ICD-10-CM

## 2021-04-29 DIAGNOSIS — M8000XA Age-related osteoporosis with current pathological fracture, unspecified site, initial encounter for fracture: Secondary | ICD-10-CM

## 2021-04-29 DIAGNOSIS — I10 Essential (primary) hypertension: Secondary | ICD-10-CM

## 2021-04-29 DIAGNOSIS — I2510 Atherosclerotic heart disease of native coronary artery without angina pectoris: Secondary | ICD-10-CM | POA: Diagnosis not present

## 2021-04-29 DIAGNOSIS — E039 Hypothyroidism, unspecified: Secondary | ICD-10-CM

## 2021-04-29 DIAGNOSIS — R2681 Unsteadiness on feet: Secondary | ICD-10-CM

## 2021-04-29 DIAGNOSIS — F339 Major depressive disorder, recurrent, unspecified: Secondary | ICD-10-CM

## 2021-04-29 NOTE — Progress Notes (Signed)
Location:   [facility] Room Number: [facility]:  ALF [phone]) Provider:  Veleta Miners MD  Mast, Man X, NP  Patient Care Team: Mast, Man X, NP as PCP - General (Internal Medicine) Jettie Booze, MD as PCP - Cardiology (Cardiology) Wilford Corner, MD as Consulting Physician (Gastroenterology) Marlou Sa, Tonna Corner, MD as Consulting Physician (Orthopedic Surgery) Marcial Pacas, MD as Consulting Physician (Neurology) Franchot Gallo, MD as Consulting Physician (Urology) Kary Kos, MD as Consulting Physician (Neurosurgery)  Extended Emergency Contact Information Primary Emergency Contact: Merlinda Frederick Address: [place] [address]          [place] [address] [place] Phone: [phone] Mobile Phone: [phone] Relation: Spouse Secondary Emergency Contact: [place] Mobile Phone: [phone] Relation: Son  Code Status:  DNR Managed Care Goals of care: Advanced Directive information Advanced Directives 04/29/2021  Does Patient Have a Medical Advance Directive? Yes  Type of Paramedic of [place];Living will;Out of facility DNR (pink MOST or yellow form)  Does patient want to make changes to medical advance directive? No - Patient declined  Copy of [place] in Chart? Yes - validated most recent copy scanned in chart (See row information)  Would patient like information on creating a medical advance directive? -  Pre-existing out of facility DNR order (yellow form or pink MOST form) Pink MOST form placed in chart (order not valid for inpatient use);Yellow form placed in chart (order not valid for inpatient use)     Chief Complaint  Patient presents with   Medical Management of Chronic Issues    HPI:  Pt is a 81 y.o. female seen today for medical management of chronic diseases.    Patient has h/o Obesity with OSA,  Chronic Diastolic CHF, CAD s/p CABG 2000 and  stent placement, PMR on chronic Steroids and Hypothyroidism,    Patient continues to be on chronic prednisone for her  PMR.  Previous tapering has failed.  Follows with rheumatology  Her main issue recently has been Recurent falls She says her Left leg sometimes feel Weak and she falls No Dizziness. No Tripping She is working with therapy now. Uses walker all the time    Past Medical History:  Diagnosis Date   Anemia    Arthritis    "fingers, back, shoulders, hips" (04/10/2016)   Chronic diastolic CHF (congestive heart failure) (HCC)    a. normal EF, LVEDP at [facility] in 6/17 33 >> Lasix started    CKD (chronic kidney disease), stage III (HCC)    Coronary artery disease    a. s/p CABG 2000 (SVG/ free LIMA Y graft to the diagonal and distal LAD, SVG to the OM 1, and SVG to the PDA) . b. Cath 12/19/2014 90% dSVG to RCA s/p 2 overlapping DES. c. LHC 2016, 2017 no intervention except diuresis needed. d. Low risk nuc 03/2016. e. Cath 12/2017 patent LIMA-LAD, SVG-OM, SVG-PDA but occluded SVG to diag.    Depression    with anxious component   GERD (gastroesophageal reflux disease)    History of hiatal hernia    Hyperlipidemia    Hypothyroidism    Obesity    OSA on CPAP    PMR (polymyalgia rheumatica) (HCC)    Pneumonia    "2-3 times" (04/10/2016)   Stable angina (HCC)    microvascular, improved with Ranexa   Subclavian artery stenosis ([place])    a. L by cath note in 2016.  TIA (transient ischemic attack)    Past Surgical History:  Procedure Laterality Date   BREAST BIOPSY Right 1964   CARDIAC CATHETERIZATION  08   patent grafts, no culprit lesions, EF 65%   CARDIAC CATHETERIZATION N/A 12/19/2014   Procedure: Left Heart Cath and Cors/Grafts Angiography;  Surgeon: Jettie Booze, MD;  Location: [place] CV LAB;  Service: Cardiovascular;  Laterality: N/A;   CARDIAC CATHETERIZATION N/A 12/19/2014   Procedure: Coronary Stent Intervention;  Surgeon: Jettie Booze, MD;  Location:  [place] [facility];  Service: Cardiovascular;  Laterality: N/A;   CARDIAC CATHETERIZATION N/A 01/01/2015   Procedure: Left Heart Cath and Cors/Grafts Angiography;  Surgeon: Jettie Booze, MD;  Location: [place] [facility];  Service: Cardiovascular;  Laterality: N/A;   CARDIAC CATHETERIZATION N/A 09/20/2015   Procedure: Left Heart Cath and Cors/Grafts Angiography;  Surgeon: Jettie Booze, MD;  Location: [place] [facility];  Service: Cardiovascular;  Laterality: N/A;   CATARACT EXTRACTION W/ INTRAOCULAR LENS  IMPLANT, BILATERAL Bilateral 2011   COLONOSCOPY WITH PROPOFOL N/A 07/27/2016   Procedure: COLONOSCOPY WITH PROPOFOL;  Surgeon: Wilford Corner, MD;  Location: [facility];  Service: Endoscopy;  Laterality: N/A;   CORONARY ARTERY BYPASS GRAFT  2000   ASCVD, multivessel, S./P.   DILATION AND CURETTAGE OF UTERUS  1980s   ESOPHAGOGASTRODUODENOSCOPY (EGD) WITH PROPOFOL N/A 07/27/2016   Procedure: ESOPHAGOGASTRODUODENOSCOPY (EGD) WITH PROPOFOL;  Surgeon: Wilford Corner, MD;  Location: [facility];  Service: Endoscopy;  Laterality: N/A;   FRACTURE SURGERY     LEFT HEART CATH AND CORS/GRAFTS ANGIOGRAPHY N/A 01/12/2018   Procedure: LEFT HEART CATH AND CORS/GRAFTS ANGIOGRAPHY;  Surgeon: Jettie Booze, MD;  Location: [place] [facility];  Service: Cardiovascular;  Laterality: N/A;   PATELLA FRACTURE SURGERY Left 1993    Allergies  Allergen Reactions   Nsaids Other (See Comments)    Due to chronic kidney failure   Butorphanol Other (See Comments)    agitation Constipation Produced a lot of urine, made patient feel crazy   Sulfa Antibiotics Rash   Bisoprolol Fumarate Other (See Comments)    Doesn't remember   Butorphanol Tartrate Other (See Comments)    Produced a lot of urine, made patient feel crazy   Codeine Nausea And Vomiting   Demerol [Meperidine] Nausea And Vomiting   Imipramine     Sweating, facial dysfunction    Meperidine And Related Nausea And Vomiting    Statins     MYALGIAS   Tequin [Gatifloxacin] Other (See Comments)    Caused hypoglycemia Low blood sugar   Brilinta [Ticagrelor] Rash    CAUSES PETECHIAE, PURPURA   Pseudoephedrine Hcl Palpitations   Septra [Sulfamethoxazole-Trimethoprim] Rash   Sulfamethoxazole-Trimethoprim Rash    Allergies as of 04/29/2021       Reactions   Nsaids Other (See Comments)   Due to chronic kidney failure   Butorphanol Other (See Comments)   agitation Constipation Produced a lot of urine, made patient feel crazy   Sulfa Antibiotics Rash   Bisoprolol Fumarate Other (See Comments)   Doesn't remember   Butorphanol Tartrate Other (See Comments)   Produced a lot of urine, made patient feel crazy   Codeine Nausea And Vomiting   Demerol [meperidine] Nausea And Vomiting   Imipramine    Sweating, facial dysfunction    Meperidine And Related Nausea And Vomiting   Statins    MYALGIAS   Tequin [gatifloxacin] Other (See Comments)   Caused hypoglycemia Low blood sugar   Brilinta [  ticagrelor] Rash   CAUSES PETECHIAE, PURPURA   Pseudoephedrine Hcl Palpitations   Septra [sulfamethoxazole-trimethoprim] Rash   Sulfamethoxazole-trimethoprim Rash        Medication List        Accurate as of April 29, 2021 11:40 AM. If you have any questions, ask your nurse or doctor.          STOP taking these medications    loperamide 2 MG tablet Commonly known as: IMODIUM A-D Stopped by: Virgie Dad, MD       TAKE these medications    acetaminophen 325 MG tablet Commonly known as: TYLENOL Take 650 mg by mouth in the morning and at bedtime.   alendronate 70 MG tablet Commonly known as: FOSAMAX Take 70 mg by mouth every Sunday.   alum & mag hydroxide-simeth 852-778-24 MG/5ML suspension Commonly known as: MAALOX PLUS Take 10 mLs by mouth as needed for indigestion.   atorvastatin 10 MG tablet Commonly known as: LIPITOR TAKE 1 TABLET ONCE DAILY.   Biofreeze 10 % Liqd Generic drug: Menthol  (Topical Analgesic) Apply topically as needed.   calcium carbonate 600 MG Tabs tablet Commonly known as: OS-CAL Take 600 mg by mouth 2 (two) times daily with a meal.   clopidogrel 75 MG tablet Commonly known as: PLAVIX TAKE 1 TABLET ONCE DAILY.   COENZYME Q-10 PO Take 10 mg by mouth at bedtime.   cyanocobalamin 1000 MCG tablet Take 1,000 mcg by mouth daily.   fluticasone 50 MCG/ACT nasal spray Commonly known as: FLONASE Place 2 sprays into both nostrils daily as needed for allergies.   Glucosamine 1500 Complex Caps Take 3 capsules by mouth daily after breakfast.   isosorbide mononitrate 60 MG 24 hr tablet Commonly known as: IMDUR Take 2 tablets (120 mg total) by mouth daily.   levocetirizine 5 MG tablet Commonly known as: XYZAL TAKE 1 TABLET ONCE DAILY IN THE EVENING.   levothyroxine 75 MCG tablet Commonly known as: SYNTHROID TAKE 1 TABLET ONCE DAILY ON EMPTY STOMACH.   melatonin 1 MG Tabs tablet Take by mouth at bedtime.   metoprolol succinate 25 MG 24 hr tablet Commonly known as: TOPROL-XL Take 25 mg by mouth in the morning and at bedtime. Take with or immediately following a meal.   Myrbetriq 50 MG Tb24 tablet Generic drug: mirabegron ER TAKE 1 TABLET BY MOUTH DAILY.   nitroGLYCERIN 0.4 MG SL tablet Commonly known as: NITROSTAT Place 0.4 mg under the tongue every 5 (five) minutes as needed for chest pain. X 3 doses   nystatin powder Commonly known as: MYCOSTATIN/NYSTOP Apply 1 application topically. apply underneath abd/breast folds As Needed   pantoprazole 40 MG tablet Commonly known as: PROTONIX Take 1 tablet (40 mg total) by mouth daily.   predniSONE 5 MG tablet Commonly known as: DELTASONE Take 5 mg by mouth daily.   PSYLLIUM HUSK PO Take 0.4 g by mouth daily. May keep at bedside   ranolazine 1000 MG SR tablet Commonly known as: RANEXA TAKE 1 TABLET BY MOUTH TWICE DAILY.   venlafaxine 75 MG tablet Commonly known as: EFFEXOR Take 150 mg  by mouth. 2 caps= 150 mg; oral  Once A Day   Vitamin D 50 MCG (2000 UT) tablet Take 2,000 Units by mouth daily.   zinc oxide 20 % ointment Apply 1 application topically as needed for irritation.        Review of Systems  Constitutional:  Negative for activity change and appetite change.  HENT: Negative.  Respiratory:  Negative for cough and shortness of breath.   Cardiovascular:  Negative for leg swelling.  Gastrointestinal:  Negative for constipation.  Genitourinary: Negative.   Musculoskeletal:  Positive for gait problem. Negative for arthralgias and myalgias.  Skin: Negative.   Neurological:  Negative for dizziness and weakness.  Psychiatric/Behavioral:  Negative for confusion, dysphoric mood and sleep disturbance.    Immunization History  Administered Date(s) Administered   HPV Bivalent 11/24/2013, 01/17/2016   Influenza Whole 12/23/2017   Influenza, High Dose Seasonal PF 12/24/2018, 12/24/2018   Influenza-Unspecified 01/10/2013, 12/27/2014, 12/30/2016, 01/02/2020, 01/09/2021   Moderna Sars-Covid-2 Vaccination 03/25/2019, 04/22/2019, 01/30/2020, 08/20/2020, 12/10/2020   Pneumococcal Conjugate-13 04/21/2013   Pneumococcal Polysaccharide-23 02/27/1993, 03/23/2009   Tdap 04/20/2012   Zoster Recombinat (Shingrix) 03/29/2021   Zoster, Live 11/10/2010   Pertinent  Health Maintenance Due  Topic Date Due   INFLUENZA VACCINE  Completed   DEXA SCAN  Completed   Fall Risk 10/14/2018 10/14/2018 10/15/2018 02/10/2019 03/27/2021  Falls in the past year? - - - 0 -  Was there an injury with Fall? - - - - -  Fall Risk Category Calculator - - - - -  Fall Risk Category - - - - -  Patient Fall Risk Level High fall risk High fall risk Moderate fall risk - Moderate fall risk   Functional Status Survey:    Vitals:   04/29/21 1127  BP: 118/66  Pulse: 70  Resp: 20  Temp: 98.7 F (37.1 C)  SpO2: 98%  Weight: 196 lb 6.4 oz (89.1 kg)  Height: 4\' 10"  (1.473 m)   Body mass index is  41.05 kg/m. Physical Exam Vitals reviewed.  Constitutional:      Appearance: Normal appearance.  HENT:     Head: Normocephalic.     Nose: Nose normal.     Mouth/Throat:     Mouth: Mucous membranes are moist.     Pharynx: Oropharynx is clear.  Eyes:     Pupils: Pupils are equal, round, and reactive to light.  Cardiovascular:     Rate and Rhythm: Normal rate and regular rhythm.     Pulses: Normal pulses.     Heart sounds: Normal heart sounds. No murmur heard. Pulmonary:     Effort: Pulmonary effort is normal.     Breath sounds: Normal breath sounds.  Abdominal:     General: Abdomen is flat. Bowel sounds are normal.     Palpations: Abdomen is soft.  Musculoskeletal:        General: No swelling.     Cervical back: Neck supple.  Skin:    General: Skin is warm.  Neurological:     General: No focal deficit present.     Mental Status: She is alert and oriented to person, place, and time.  Psychiatric:        Mood and Affect: Mood normal.        Thought Content: Thought content normal.    Labs reviewed: Recent Labs    08/24/20 0000 04/01/21 0000  NA 139 141  K 4.2 4.8  CL 107 106  CO2 23* 27*  BUN 25* 24*  CREATININE 1.2* 1.2*  CALCIUM 8.3* 8.6*   Recent Labs    08/24/20 0000 04/01/21 0000  AST 9* 10*  ALT 7 6*  ALKPHOS 42 39  ALBUMIN 3.4* 3.4*   Recent Labs    08/24/20 0000 09/12/20 0000 04/01/21 0000  WBC 11.0 11.8 8.8  NEUTROABS 7,535.00  --  5,315.00  HGB 9.9*  11.5* 9.8*  HCT 30* 36 30*  PLT 245 306 238   Lab Results  Component Value Date   TSH 3.56 04/01/2021   Lab Results  Component Value Date   HGBA1C 5.1 02/14/2019   Lab Results  Component Value Date   CHOL 151 08/24/2020   HDL 57 08/24/2020   LDLCALC 70 08/24/2020   TRIG 162 (A) 08/24/2020   CHOLHDL 2.1 06/07/2018    Significant Diagnostic Results in last 30 days:  No results found.  Assessment/Plan Gait instability Now working with therapy Also needs her room  decluter Needs to use walker all the time  Essential hypertension On Metoprolol Passive control due to Falls Stage 3b chronic kidney disease (HCC) Creat staying stable CAD On Imdur, Plavix and statin No Active symptoms Seen by cardiology with no change  Hypothyroidism, unspecified type TSH normal in 01/23  GERD On Protonix  Depression, recurrent (HCC) Continue Effexor Age-related osteoporosis Fosamax Discontinued after talking to Dr Dossie Der ? Plan for Prolia eventually Polymyalgia rheumatica ([place]) On Chronic Prednisone Has failed further taper Obstructive sleep apnea syndrome On CPAP Anemia Chronic disease Will recheck iron studies B12 normal in 06/22  Family/ staff Communication:   Labs/tests ordered:  CBC,Iron studies

## 2021-04-30 NOTE — Telephone Encounter (Signed)
OV note dated 03/28/2021 states SNF but patient is in [place].  Danielle with [facility] will be faxing a CMN for CPAP supplies for [facility] to sign.

## 2021-04-30 NOTE — Telephone Encounter (Signed)
Received Fax from [facility] [phone] requesting supplies for CPAP and OV note supporting.  Policy #: [id number]  Printed OV note dated 03/28/2021 and attached to fax. Placed order in ManXie's box to review and sign.   To be faxed back to [facility] Fax: [phone] once completed.

## 2021-05-01 NOTE — Telephone Encounter (Signed)
Mast, Man X, NP  You 1 hour ago (9:56 AM)   Thanks, I will sign it tomorrow.

## 2021-05-23 LAB — IRON,TIBC AND FERRITIN PANEL
%SAT: 17
Ferritin: 25
Iron: 47
TIBC: 282

## 2021-05-23 LAB — CBC AND DIFFERENTIAL
HCT: 31 — AB (ref 36–46)
Hemoglobin: 10.1 — AB (ref 12.0–16.0)
Platelets: 263 10*3/uL (ref 150–400)
WBC: 9.3

## 2021-05-23 LAB — CBC: RBC: 3.27 — AB (ref 3.87–5.11)

## 2021-06-06 ENCOUNTER — Encounter: Payer: Self-pay | Admitting: Nurse Practitioner

## 2021-06-06 ENCOUNTER — Non-Acute Institutional Stay: Payer: Medicare Other | Admitting: Nurse Practitioner

## 2021-06-06 DIAGNOSIS — N1831 Chronic kidney disease, stage 3a: Secondary | ICD-10-CM

## 2021-06-06 DIAGNOSIS — E039 Hypothyroidism, unspecified: Secondary | ICD-10-CM

## 2021-06-06 DIAGNOSIS — R410 Disorientation, unspecified: Secondary | ICD-10-CM | POA: Diagnosis not present

## 2021-06-06 DIAGNOSIS — M159 Polyosteoarthritis, unspecified: Secondary | ICD-10-CM

## 2021-06-06 DIAGNOSIS — M353 Polymyalgia rheumatica: Secondary | ICD-10-CM

## 2021-06-06 DIAGNOSIS — F339 Major depressive disorder, recurrent, unspecified: Secondary | ICD-10-CM

## 2021-06-06 DIAGNOSIS — R2681 Unsteadiness on feet: Secondary | ICD-10-CM

## 2021-06-06 DIAGNOSIS — D519 Vitamin B12 deficiency anemia, unspecified: Secondary | ICD-10-CM

## 2021-06-06 DIAGNOSIS — E785 Hyperlipidemia, unspecified: Secondary | ICD-10-CM

## 2021-06-06 DIAGNOSIS — R350 Frequency of micturition: Secondary | ICD-10-CM

## 2021-06-06 DIAGNOSIS — M8000XA Age-related osteoporosis with current pathological fracture, unspecified site, initial encounter for fracture: Secondary | ICD-10-CM

## 2021-06-06 DIAGNOSIS — G4733 Obstructive sleep apnea (adult) (pediatric): Secondary | ICD-10-CM

## 2021-06-06 DIAGNOSIS — I10 Essential (primary) hypertension: Secondary | ICD-10-CM | POA: Diagnosis not present

## 2021-06-06 DIAGNOSIS — I2510 Atherosclerotic heart disease of native coronary artery without angina pectoris: Secondary | ICD-10-CM

## 2021-06-06 DIAGNOSIS — K219 Gastro-esophageal reflux disease without esophagitis: Secondary | ICD-10-CM

## 2021-06-06 DIAGNOSIS — R413 Other amnesia: Secondary | ICD-10-CM | POA: Insufficient documentation

## 2021-06-06 LAB — BASIC METABOLIC PANEL
BUN: 25 — AB (ref 4–21)
CO2: 23 — AB (ref 13–22)
Chloride: 107 (ref 99–108)
Creatinine: 1 (ref 0.5–1.1)
Glucose: 89
Potassium: 4.5 mEq/L (ref 3.5–5.1)
Sodium: 138 (ref 137–147)

## 2021-06-06 LAB — CBC AND DIFFERENTIAL
HCT: 32 — AB (ref 36–46)
Hemoglobin: 10.3 — AB (ref 12.0–16.0)
Neutrophils Absolute: 7711
Platelets: 269 10*3/uL (ref 150–400)
WBC: 10.8

## 2021-06-06 LAB — HEPATIC FUNCTION PANEL
ALT: 11 U/L (ref 7–35)
AST: 12 — AB (ref 13–35)
Alkaline Phosphatase: 52 (ref 25–125)
Bilirubin, Total: 0.3

## 2021-06-06 LAB — CBC: RBC: 3.36 — AB (ref 3.87–5.11)

## 2021-06-06 LAB — COMPREHENSIVE METABOLIC PANEL
Albumin: 3.7 (ref 3.5–5.0)
Calcium: 9 (ref 8.7–10.7)
Globulin: 2.4

## 2021-06-06 NOTE — Assessment & Plan Note (Signed)
takes Prednisone 5mg qd(failed GDR) 

## 2021-06-06 NOTE — Assessment & Plan Note (Signed)
stable, on Myrbetriq. ?

## 2021-06-06 NOTE — Assessment & Plan Note (Signed)
on B12, Iron 47, Hgb 10.1 05/22/21 ?

## 2021-06-06 NOTE — Assessment & Plan Note (Signed)
Her mood is stable, on Venlafaxine ?

## 2021-06-06 NOTE — Assessment & Plan Note (Signed)
Blood pressure is controlled, on Metoprolol 

## 2021-06-06 NOTE — Assessment & Plan Note (Signed)
uses walker for ambulation.   

## 2021-06-06 NOTE — Assessment & Plan Note (Signed)
takes Vit B12, Vit B12 162 08/03/19>> Vit B12 869 08/24/20 

## 2021-06-06 NOTE — Assessment & Plan Note (Signed)
takes Ca, Vit D. 04/29/21 Dr. Syed dc Fosamax.  05/07/21 DEXA t score -1.273 

## 2021-06-06 NOTE — Assessment & Plan Note (Signed)
taking Atorvastatin.  

## 2021-06-06 NOTE — Assessment & Plan Note (Signed)
No angina reported,  on Isosorbide and Ranolazine, Plavix. LDL 70 08/24/20 ?

## 2021-06-06 NOTE — Assessment & Plan Note (Signed)
stable, on Pantoprazole  

## 2021-06-06 NOTE — Assessment & Plan Note (Signed)
uses CPAP 

## 2021-06-06 NOTE — Assessment & Plan Note (Signed)
,   takes Tylenol ?

## 2021-06-06 NOTE — Assessment & Plan Note (Signed)
on Levothyroxine, TSH 3.56 04/01/21 

## 2021-06-06 NOTE — Assessment & Plan Note (Signed)
Bun/creat 24/1.2 04/01/21 ?

## 2021-06-06 NOTE — Progress Notes (Signed)
?Location:   [facility] ?[facility] Home Room Number: 416 ?Place of Service:  ALF (13) ?Provider: Marlana Latus NP ? ?Coreen Shippee X, NP ? ?Patient Care Team: ?Elgene Coral X, NP as PCP - General (Internal Medicine) ?Jettie Booze, MD as PCP - Cardiology (Cardiology) ?Wilford Corner, MD as Consulting Physician (Gastroenterology) ?Meredith Pel, MD as Consulting Physician (Orthopedic Surgery) ?Marcial Pacas, MD as Consulting Physician (Neurology) ?Franchot Gallo, MD as Consulting Physician (Urology) ?Kary Kos, MD as Consulting Physician (Neurosurgery) ? ?Extended Emergency Contact Information ?Primary Emergency Contact: Lohr,Perry ?Address: [address] ?         [place] [address] [place] ?Home Phone: [phone] ?Mobile Phone: [phone] ?Relation: Spouse ?Secondary Emergency Contact: Lutes,NATHAN ?Mobile Phone: [phone] ?Relation: Son ? ?Code Status: DNR ?Goals of care: Advanced Directive information ?Advanced Directives 06/06/2021  ?Does Patient Have a Medical Advance Directive? Yes  ?Type of Paramedic of [place];Living will;Out of facility DNR (pink MOST or yellow form)  ?Does patient want to make changes to medical advance directive? No - Patient declined  ?Copy of [place] in Chart? Yes - validated most recent copy scanned in chart (See row information)  ?Would patient like information on creating a medical advance directive? -  ?Pre-existing out of facility DNR order (yellow form or pink MOST form) Pink MOST form placed in chart (order not valid for inpatient use);Yellow form placed in chart (order not valid for inpatient use)  ? ? ? ?Chief Complaint  ?Patient presents with  ? Acute Visit  ?  Memory loss  ? ? ?HPI:  ?Pt is a 81 y.o. female seen today for an acute visit for c/o confusion, difficulty thinking straight or words finding. Denied headache, change of vision, chest pain/pressure, palpitation, focal weakness,  or dysuria. She sleeps and eats at her baseline, ambulates with walker as usual.  ?  ?  The patient fell 03/27/20,  fell backwards, hit her head on the floor-resulted a hematoma superior left parietal region. CT head/neck unremarkable.  ?            Gait abnormality, uses walker for ambulation.  ?            HTN, on Metoprolol ?            CKD IIIa Bun/creat 24/1.2 04/01/21 ?            CAD, on Isosorbide and Ranolazine, Plavix. LDL 70 08/24/20 ?            Hypothyroidism, on Levothyroxine, TSH 3.56 04/01/21 ? Anemia, on B12, Iron 47, Hgb 10.1 05/22/21 ?            Urinary frequency, stable, on Myrbetriq. ?            GERD, stable, on Pantoprazole.   ?            Her mood is stable, on Venlafaxine ?            OA, takes Tylenol ?            OP, takes Ca, Vit D. 04/29/21 Dr. Dossie Der dc Fosamax.  ?05/07/21 DEXA t score -1.273 ?            Vit B12 deficiency, takes Vit B12, Vit B12 162 08/03/19>> Vit B12 869 08/24/20 ? Hyperlipidemia, taking Atorvastatin.  ?            PMR, takes Prednisone '5mg'$  qd(failed GDR) ?  OSA ,uses CPAP ? ?Past Medical History:  ?Diagnosis Date  ? Anemia   ? Arthritis   ? "fingers, back, shoulders, hips" (04/10/2016)  ? Chronic diastolic CHF (congestive heart failure) ([place])   ? a. normal EF, LVEDP at [facility] in 6/17 33 >> Lasix started   ? CKD (chronic kidney disease), stage III ([place])   ? Coronary artery disease   ? a. s/p CABG 2000 (SVG/ free LIMA Y graft to the diagonal and distal LAD, SVG to the OM 1, and SVG to the PDA) . b. Cath 12/19/2014 90% dSVG to RCA s/p 2 overlapping DES. c. LHC 2016, 2017 no intervention except diuresis needed. d. Low risk nuc 03/2016. e. Cath 12/2017 patent LIMA-LAD, SVG-OM, SVG-PDA but occluded SVG to diag.   ? Depression   ? with anxious component  ? GERD (gastroesophageal reflux disease)   ? History of hiatal hernia   ? Hyperlipidemia   ? Hypothyroidism   ? Obesity   ? OSA on CPAP   ? PMR (polymyalgia rheumatica) (HCC)   ? Pneumonia   ? "2-3 times" (04/10/2016)  ? Stable angina  (HCC)   ? microvascular, improved with Ranexa  ? Subclavian artery stenosis (HCC)   ? a. L by cath note in 2016.  ? TIA (transient ischemic attack)   ? ?Past Surgical History:  ?Procedure Laterality Date  ? BREAST BIOPSY Right 1964  ? CARDIAC CATHETERIZATION  08  ? patent grafts, no culprit lesions, EF 65%  ? CARDIAC CATHETERIZATION N/A 12/19/2014  ? Procedure: Left Heart Cath and Cors/Grafts Angiography;  Surgeon: Jettie Booze, MD;  Location: [place] [facility];  Service: Cardiovascular;  Laterality: N/A;  ? CARDIAC CATHETERIZATION N/A 12/19/2014  ? Procedure: Coronary Stent Intervention;  Surgeon: Jettie Booze, MD;  Location: [place] [facility];  Service: Cardiovascular;  Laterality: N/A;  ? CARDIAC CATHETERIZATION N/A 01/01/2015  ? Procedure: Left Heart Cath and Cors/Grafts Angiography;  Surgeon: Jettie Booze, MD;  Location: [place] [facility];  Service: Cardiovascular;  Laterality: N/A;  ? CARDIAC CATHETERIZATION N/A 09/20/2015  ? Procedure: Left Heart Cath and Cors/Grafts Angiography;  Surgeon: Jettie Booze, MD;  Location: [place] [facility];  Service: Cardiovascular;  Laterality: N/A;  ? CATARACT EXTRACTION W/ INTRAOCULAR LENS  IMPLANT, BILATERAL Bilateral 2011  ? COLONOSCOPY WITH PROPOFOL N/A 07/27/2016  ? Procedure: COLONOSCOPY WITH PROPOFOL;  Surgeon: Wilford Corner, MD;  Location: [place];  Service: Endoscopy;  Laterality: N/A;  ? CORONARY ARTERY BYPASS GRAFT  2000  ? ASCVD, multivessel, S./P.  ? DILATION AND CURETTAGE OF UTERUS  1980s  ? ESOPHAGOGASTRODUODENOSCOPY (EGD) WITH PROPOFOL N/A 07/27/2016  ? Procedure: ESOPHAGOGASTRODUODENOSCOPY (EGD) WITH PROPOFOL;  Surgeon: Wilford Corner, MD;  Location: [place];  Service: Endoscopy;  Laterality: N/A;  ? FRACTURE SURGERY    ? LEFT HEART CATH AND CORS/GRAFTS ANGIOGRAPHY N/A 01/12/2018  ? Procedure: LEFT HEART CATH AND CORS/GRAFTS ANGIOGRAPHY;  Surgeon: Jettie Booze, MD;  Location: [place] [facility];  Service:  Cardiovascular;  Laterality: N/A;  ? [place]  ? ? ?Allergies  ?Allergen Reactions  ? Nsaids Other (See Comments)  ?  Due to chronic kidney failure  ? Butorphanol Other (See Comments)  ?  agitation ?Constipation ?Produced a lot of urine, made patient feel crazy  ? Sulfa Antibiotics Rash  ? Bisoprolol Fumarate Other (See Comments)  ?  Doesn't remember  ? Butorphanol Tartrate Other (See Comments)  ?  Produced a lot of urine,  made patient feel crazy  ? Codeine Nausea And Vomiting  ? Demerol [Meperidine] Nausea And Vomiting  ? Imipramine   ?  Sweating, facial dysfunction   ? Meperidine And Related Nausea And Vomiting  ? Statins   ?  MYALGIAS  ? Tequin [Gatifloxacin] Other (See Comments)  ?  Caused hypoglycemia ?Low blood sugar  ? Brilinta [Ticagrelor] Rash  ?  CAUSES PETECHIAE, PURPURA  ? Pseudoephedrine Hcl Palpitations  ? Septra [Sulfamethoxazole-Trimethoprim] Rash  ? Sulfamethoxazole-Trimethoprim Rash  ? ? ?Allergies as of 06/06/2021   ? ?   Reactions  ? Nsaids Other (See Comments)  ? Due to chronic kidney failure  ? Butorphanol Other (See Comments)  ? agitation ?Constipation ?Produced a lot of urine, made patient feel crazy  ? Sulfa Antibiotics Rash  ? Bisoprolol Fumarate Other (See Comments)  ? Doesn't remember  ? Butorphanol Tartrate Other (See Comments)  ? Produced a lot of urine, made patient feel crazy  ? Codeine Nausea And Vomiting  ? Demerol [meperidine] Nausea And Vomiting  ? Imipramine   ? Sweating, facial dysfunction   ? Meperidine And Related Nausea And Vomiting  ? Statins   ? MYALGIAS  ? Tequin [gatifloxacin] Other (See Comments)  ? Caused hypoglycemia ?Low blood sugar  ? Brilinta [ticagrelor] Rash  ? CAUSES PETECHIAE, PURPURA  ? Pseudoephedrine Hcl Palpitations  ? Septra [sulfamethoxazole-trimethoprim] Rash  ? Sulfamethoxazole-trimethoprim Rash  ? ?  ? ?  ?Medication List  ?  ? ?  ? Accurate as of June 06, 2021 11:59 PM. If you have any questions, ask your nurse or doctor.  ?   ?  ? ?  ? ?acetaminophen 325 MG tablet ?Commonly known as: TYLENOL ?Take 650 mg by mouth in the morning and at bedtime. ?  ?alum & mag hydroxide-simeth 007-622-63 MG/5ML suspension ?Commonly known as: MAALO

## 2021-06-06 NOTE — Assessment & Plan Note (Addendum)
c/o confusion, difficulty thinking straight or words finding. Denied headache, change of vision, chest pain/pressure, palpitation, focal weakness, or dysuria. She sleeps and eats at her baseline, ambulates with walker as usual.  ?Will stat CBC/diff, CMP/eGFR, MMSE, may consider CT head in setting of fall 03/27/20 with scalp hematoma and taking Plavix. Observe.  ?06/06/21 MMSE 29/30, wbc 10.8, Hgb 10.3, plt 269, neutrophils 71.4, Na 138, K 4.5, Bun 25, creat 1.04, eGFR 54 ?06/09/21 CT head w/o CM ?

## 2021-06-09 ENCOUNTER — Encounter: Payer: Self-pay | Admitting: Nurse Practitioner

## 2021-06-11 ENCOUNTER — Other Ambulatory Visit: Payer: Self-pay | Admitting: Nurse Practitioner

## 2021-06-11 DIAGNOSIS — R413 Other amnesia: Secondary | ICD-10-CM

## 2021-07-02 ENCOUNTER — Ambulatory Visit
Admission: RE | Admit: 2021-07-02 | Discharge: 2021-07-02 | Disposition: A | Discharge status: Home / Self Care | Payer: Medicare Other | Source: Ambulatory Visit | Attending: Nurse Practitioner | Admitting: Nurse Practitioner

## 2021-07-02 DIAGNOSIS — R413 Other amnesia: Secondary | ICD-10-CM

## 2021-07-18 ENCOUNTER — Non-Acute Institutional Stay (SKILLED_NURSING_FACILITY): Payer: Medicare Other | Admitting: Nurse Practitioner

## 2021-07-18 ENCOUNTER — Encounter: Payer: Self-pay | Admitting: Nurse Practitioner

## 2021-07-18 DIAGNOSIS — R413 Other amnesia: Secondary | ICD-10-CM | POA: Diagnosis not present

## 2021-07-18 DIAGNOSIS — D631 Anemia in chronic kidney disease: Secondary | ICD-10-CM

## 2021-07-18 DIAGNOSIS — K219 Gastro-esophageal reflux disease without esophagitis: Secondary | ICD-10-CM

## 2021-07-18 DIAGNOSIS — M8000XA Age-related osteoporosis with current pathological fracture, unspecified site, initial encounter for fracture: Secondary | ICD-10-CM

## 2021-07-18 DIAGNOSIS — G4733 Obstructive sleep apnea (adult) (pediatric): Secondary | ICD-10-CM

## 2021-07-18 DIAGNOSIS — E785 Hyperlipidemia, unspecified: Secondary | ICD-10-CM

## 2021-07-18 DIAGNOSIS — I10 Essential (primary) hypertension: Secondary | ICD-10-CM

## 2021-07-18 DIAGNOSIS — F339 Major depressive disorder, recurrent, unspecified: Secondary | ICD-10-CM

## 2021-07-18 DIAGNOSIS — R2681 Unsteadiness on feet: Secondary | ICD-10-CM | POA: Diagnosis not present

## 2021-07-18 DIAGNOSIS — E039 Hypothyroidism, unspecified: Secondary | ICD-10-CM

## 2021-07-18 DIAGNOSIS — D519 Vitamin B12 deficiency anemia, unspecified: Secondary | ICD-10-CM

## 2021-07-18 DIAGNOSIS — N1831 Chronic kidney disease, stage 3a: Secondary | ICD-10-CM | POA: Diagnosis not present

## 2021-07-18 DIAGNOSIS — M353 Polymyalgia rheumatica: Secondary | ICD-10-CM

## 2021-07-18 DIAGNOSIS — M159 Polyosteoarthritis, unspecified: Secondary | ICD-10-CM

## 2021-07-18 DIAGNOSIS — R350 Frequency of micturition: Secondary | ICD-10-CM

## 2021-07-18 DIAGNOSIS — I2510 Atherosclerotic heart disease of native coronary artery without angina pectoris: Secondary | ICD-10-CM

## 2021-07-18 NOTE — Assessment & Plan Note (Signed)
stable, on Pantoprazole  

## 2021-07-18 NOTE — Progress Notes (Signed)
?Location:  [facility] ?Nursing Home Room Number: 903/A ?Place of Service:  SNF (31) ?Provider:Biagio Snelson X, NP ? ? ?Alexzavier Girardin X, NP ? ?Patient Care Team: ?Zykerria Tanton X, NP as PCP - General (Internal Medicine) ?Jettie Booze, MD as PCP - Cardiology (Cardiology) ?Wilford Corner, MD as Consulting Physician (Gastroenterology) ?Meredith Pel, MD as Consulting Physician (Orthopedic Surgery) ?Marcial Pacas, MD as Consulting Physician (Neurology) ?Franchot Gallo, MD as Consulting Physician (Urology) ?Kary Kos, MD as Consulting Physician (Neurosurgery) ? ?Extended Emergency Contact Information ?Primary Emergency Contact: Jeng,Perry ?Address: [address] ?         [place] [address] [place] ?Home Phone: [phone] ?Mobile Phone: [phone] ?Relation: Spouse ?Secondary Emergency Contact: Woolverton,NATHAN ?Mobile Phone: [phone] ?Relation: Son ? ?Code Status:  DNR ?Goals of care: Advanced Directive information ? ?  07/18/2021  ? 10:53 AM  ?Advanced Directives  ?Does Patient Have a Medical Advance Directive? Yes  ?Type of Paramedic of [place];Living will;Out of facility DNR (pink MOST or yellow form)  ?Does patient want to make changes to medical advance directive? No - Patient declined  ?Copy of [place] in Chart? Yes - validated most recent copy scanned in chart (See row information)  ?Pre-existing out of facility DNR order (yellow form or pink MOST form) Yellow form placed in chart (order not valid for inpatient use);Pink MOST form placed in chart (order not valid for inpatient use)  ? ? ? ?Chief Complaint  ?Patient presents with  ? Medical Management of Chronic Issues  ?  Patient is here for a follow up for chronic conditions ?  ? ? ?HPI:  ?Pt is a 81 y.o. female seen today for managing chronic medical conditions.  ?  ? Gait abnormality, uses walker for ambulation.  ?            HTN, on Metoprolol ? Memory loss:  MMSE 29/30 06/06/21, 07/02/21 CT head w/o CM, No acute abnormality no change from earlier this year. Moderate atrophy and moderate chronic microvascular ischemic change ?in the white matter. ?  ?  ?            CKD IIIa Bun/creat 25/1.0 06/06/21 ?            CAD, on Isosorbide and Ranolazine, Plavix. LDL 70 08/24/20 ?            Hypothyroidism, on Levothyroxine, TSH 3.56 04/01/21 ?            Anemia, on B12, Iron 47 05/22/21, Hgb 10.3 06/06/21 ?            Urinary frequency and occasional incontinent of urine, on Myrbetriq. ?            GERD, stable, on Pantoprazole.   ?            Her mood is stable, on Venlafaxine, TSH 3.56 04/01/21 ?            OA, takes Tylenol ?            OP, takes Ca, Vit D. 04/29/21 Dr. Dossie Der dc Fosamax.  ?05/07/21 DEXA t score -1.273 ?            Vit B12 deficiency, takes Vit B12, Vit B12 162 08/03/19>> Vit B12 869 08/24/20 ?            Hyperlipidemia, taking Atorvastatin.  ?  PMR, takes Prednisone '5mg'$  qd(failed GDR) ?            OSA ,uses CPAP ? ?Past Medical History:  ?Diagnosis Date  ? Anemia   ? Arthritis   ? "fingers, back, shoulders, hips" (04/10/2016)  ? Chronic diastolic CHF (congestive heart failure) ([place])   ? a. normal EF, [facility]P at [facility] in 6/17 33 >> Lasix started   ? CKD (chronic kidney disease), stage III ([place])   ? Coronary artery disease   ? a. s/p CABG 2000 (SVG/ free LIMA Y graft to the diagonal and distal LAD, SVG to the OM 1, and SVG to the PDA) . b. Cath 12/19/2014 90% dSVG to RCA s/p 2 overlapping DES. c. LHC 2016, 2017 no intervention except diuresis needed. d. Low risk nuc 03/2016. e. Cath 12/2017 patent LIMA-LAD, SVG-OM, SVG-PDA but occluded SVG to diag.   ? Depression   ? with anxious component  ? GERD (gastroesophageal reflux disease)   ? History of hiatal hernia   ? Hyperlipidemia   ? Hypothyroidism   ? Obesity   ? OSA on CPAP   ? PMR (polymyalgia rheumatica) (HCC)   ? Pneumonia   ? "2-3 times" (04/10/2016)  ? Stable angina (HCC)   ? microvascular, improved with Ranexa  ?  Subclavian artery stenosis (HCC)   ? a. L by cath note in 2016.  ? TIA (transient ischemic attack)   ? ?Past Surgical History:  ?Procedure Laterality Date  ? BREAST BIOPSY Right 1964  ? CARDIAC CATHETERIZATION  08  ? patent grafts, no culprit lesions, EF 65%  ? CARDIAC CATHETERIZATION N/A 12/19/2014  ? Procedure: Left Heart Cath and Cors/Grafts Angiography;  Surgeon: Jettie Booze, MD;  Location: [place] CV LAB;  Service: Cardiovascular;  Laterality: N/A;  ? CARDIAC CATHETERIZATION N/A 12/19/2014  ? Procedure: Coronary Stent Intervention;  Surgeon: Jettie Booze, MD;  Location: [place] CV LAB;  Service: Cardiovascular;  Laterality: N/A;  ? CARDIAC CATHETERIZATION N/A 01/01/2015  ? Procedure: Left Heart Cath and Cors/Grafts Angiography;  Surgeon: Jettie Booze, MD;  Location: [place] CV LAB;  Service: Cardiovascular;  Laterality: N/A;  ? CARDIAC CATHETERIZATION N/A 09/20/2015  ? Procedure: Left Heart Cath and Cors/Grafts Angiography;  Surgeon: Jettie Booze, MD;  Location: [place] [facility] LAB;  Service: Cardiovascular;  Laterality: N/A;  ? CATARACT EXTRACTION W/ INTRAOCULAR LENS  IMPLANT, BILATERAL Bilateral 2011  ? COLONOSCOPY WITH PROPOFOL N/A 07/27/2016  ? Procedure: COLONOSCOPY WITH PROPOFOL;  Surgeon: Wilford Corner, MD;  Location: [place];  Service: Endoscopy;  Laterality: N/A;  ? CORONARY ARTERY BYPASS GRAFT  2000  ? ASCVD, multivessel, S./P.  ? DILATION AND CURETTAGE OF UTERUS  1980s  ? ESOPHAGOGASTRODUODENOSCOPY (EGD) WITH PROPOFOL N/A 07/27/2016  ? Procedure: ESOPHAGOGASTRODUODENOSCOPY (EGD) WITH PROPOFOL;  Surgeon: Wilford Corner, MD;  Location: [place];  Service: Endoscopy;  Laterality: N/A;  ? FRACTURE SURGERY    ? LEFT HEART CATH AND CORS/GRAFTS ANGIOGRAPHY N/A 01/12/2018  ? Procedure: LEFT HEART CATH AND CORS/GRAFTS ANGIOGRAPHY;  Surgeon: Jettie Booze, MD;  Location: [place] [facility] LAB;  Service: Cardiovascular;  Laterality: N/A;  ? [place]  ? ? ?Allergies  ?Allergen Reactions  ? Nsaids Other (See Comments)  ?  Due to chronic kidney failure  ? Butorphanol Other (See Comments)  ?  agitation ?Constipation ?Produced a lot of urine, made patient feel crazy  ? Sulfa Antibiotics Rash  ? Bisoprolol Fumarate Other (See Comments)  ?  Doesn't remember  ? Butorphanol Tartrate Other (See Comments)  ?  Produced a lot of urine, made patient feel crazy  ? Codeine Nausea And Vomiting  ? Demerol [Meperidine] Nausea And Vomiting  ? Imipramine   ?  Sweating, facial dysfunction   ? Meperidine And Related Nausea And Vomiting  ? Statins   ?  MYALGIAS  ? Tequin [Gatifloxacin] Other (See Comments)  ?  Caused hypoglycemia ?Low blood sugar  ? Brilinta [Ticagrelor] Rash  ?  CAUSES PETECHIAE, PURPURA  ? Pseudoephedrine Hcl Palpitations  ? Septra [Sulfamethoxazole-Trimethoprim] Rash  ? Sulfamethoxazole-Trimethoprim Rash  ? ? ?Outpatient Encounter Medications as of 07/18/2021  ?Medication Sig  ? acetaminophen (TYLENOL) 325 MG tablet Take 650 mg by mouth in the morning and at bedtime.  ? alum & mag hydroxide-simeth (MAALOX PLUS) 400-400-40 MG/5ML suspension Take 10 mLs by mouth as needed for indigestion.  ? atorvastatin (LIPITOR) 10 MG tablet TAKE 1 TABLET ONCE DAILY.  ? calcium carbonate (OS-CAL) 600 MG TABS tablet Take 600 mg by mouth 2 (two) times daily with a meal.  ? Cholecalciferol (VITAMIN D) 50 MCG (2000 UT) tablet Take 2,000 Units by mouth daily.  ? clopidogrel (PLAVIX) 75 MG tablet TAKE 1 TABLET ONCE DAILY.  ? COENZYME Q-10 PO Take 10 mg by mouth at bedtime.  ? cyanocobalamin 1000 MCG tablet Take 1,000 mcg by mouth daily.  ? fluticasone (FLONASE) 50 MCG/ACT nasal spray Place 2 sprays into both nostrils daily as needed for allergies.   ? Glucosamine-Chondroit-Vit C-Mn (GLUCOSAMINE 1500 COMPLEX) CAPS Take 3 capsules by mouth daily after breakfast.   ? isosorbide mononitrate (IMDUR) 60 MG 24 hr tablet Take 2 tablets (120 mg total) by mouth daily.  ?  levocetirizine (XYZAL) 5 MG tablet TAKE 1 TABLET ONCE DAILY IN THE EVENING.  ? levothyroxine (SYNTHROID) 75 MCG tablet TAKE 1 TABLET ONCE DAILY ON EMPTY STOMACH.  ? Melatonin 1 MG TABS Take by mouth at bedtime.  ? M

## 2021-07-18 NOTE — Assessment & Plan Note (Signed)
MMSE 29/30 06/06/21, 07/02/21 CT head w/o CM, No acute abnormality no change from earlier this year. Moderate atrophy and moderate chronic microvascular ischemic change ?in the white matter. ?The patient declined neurology evaluation, desires: Namenda starter pk, then '10mg'$  bid. Observe.  ?

## 2021-07-18 NOTE — Assessment & Plan Note (Signed)
on Levothyroxine, TSH 3.56 04/01/21 

## 2021-07-18 NOTE — Assessment & Plan Note (Signed)
takes Tylenol    

## 2021-07-18 NOTE — Assessment & Plan Note (Signed)
Bun/creat 25/1.0 06/06/21 

## 2021-07-18 NOTE — Assessment & Plan Note (Signed)
Blood pressure is controlled, continue Metoprolol. 

## 2021-07-18 NOTE — Assessment & Plan Note (Signed)
uses walker for ambulation.   

## 2021-07-18 NOTE — Assessment & Plan Note (Signed)
taking Atorvastatin.  

## 2021-07-18 NOTE — Assessment & Plan Note (Signed)
Urinary frequency and occasional incontinent of urine, on Myrbetriq. 

## 2021-07-18 NOTE — Assessment & Plan Note (Signed)
Her mood is stable, on Venlafaxine, TSH 3.56 04/01/21 

## 2021-07-18 NOTE — Assessment & Plan Note (Signed)
takes Ca, Vit D. 04/29/21 Dr. Syed dc Fosamax.  05/07/21 DEXA t score -1.273 

## 2021-07-18 NOTE — Assessment & Plan Note (Signed)
on B12, Iron 47 05/22/21, Hgb 10.3 06/06/21 ?

## 2021-07-18 NOTE — Assessment & Plan Note (Signed)
uses CPAP 

## 2021-07-18 NOTE — Assessment & Plan Note (Signed)
No reported chest pain, on Isosorbide and Ranolazine, Plavix. LDL 70 08/24/20 ?

## 2021-07-18 NOTE — Assessment & Plan Note (Signed)
takes Vit B12, Vit B12 162 08/03/19>> Vit B12 869 08/24/20 

## 2021-07-18 NOTE — Assessment & Plan Note (Signed)
takes Prednisone 5mg qd(failed GDR) 

## 2021-07-21 ENCOUNTER — Encounter: Payer: Self-pay | Admitting: Nurse Practitioner

## 2021-08-13 ENCOUNTER — Non-Acute Institutional Stay (SKILLED_NURSING_FACILITY): Payer: Medicare Other | Admitting: Orthopedic Surgery

## 2021-08-13 ENCOUNTER — Encounter: Payer: Self-pay | Admitting: Orthopedic Surgery

## 2021-08-13 DIAGNOSIS — R2681 Unsteadiness on feet: Secondary | ICD-10-CM

## 2021-08-13 DIAGNOSIS — M545 Low back pain, unspecified: Secondary | ICD-10-CM | POA: Diagnosis not present

## 2021-08-13 DIAGNOSIS — W19XXXA Unspecified fall, initial encounter: Secondary | ICD-10-CM | POA: Diagnosis not present

## 2021-08-13 MED ORDER — METHOCARBAMOL 500 MG PO TABS: 250.0000 mg | ORAL_TABLET | Freq: Two times a day (BID) | ORAL | 0 refills | Status: DC | PRN

## 2021-08-13 NOTE — Progress Notes (Signed)
Location:  [facility] Room Number: 903/A Place of Service:  SNF (31) Provider:  Yvonna Alanis, NP   Mast, Man X, NP  Patient Care Team: Mast, Man X, NP as PCP - General (Internal Medicine) Jettie Booze, MD as PCP - Cardiology (Cardiology) Wilford Corner, MD as Consulting Physician (Gastroenterology) Marlou Sa, Tonna Corner, MD as Consulting Physician (Orthopedic Surgery) Marcial Pacas, MD as Consulting Physician (Neurology) Franchot Gallo, MD as Consulting Physician (Urology) Kary Kos, MD as Consulting Physician (Neurosurgery)  Extended Emergency Contact Information Primary Emergency Contact: Merlinda Frederick Address: [address]          [place] [address] [place] Phone: [phone] Mobile Phone: [phone] Relation: Spouse Secondary Emergency Contact: Miranda Mobile Phone: [phone] Relation: Son  Code Status:  DNR Goals of care: Advanced Directive information    07/18/2021   10:53 AM  Advanced Directives  Does Patient Have a Medical Advance Directive? Yes  Type of Paramedic of [place];Living will;Out of facility DNR (pink MOST or yellow form)  Does patient want to make changes to medical advance directive? No - Patient declined  Copy of [place] in Chart? Yes - validated most recent copy scanned in chart (See row information)  Pre-existing out of facility DNR order (yellow form or pink MOST form) Yellow form placed in chart (order not valid for inpatient use);Pink MOST form placed in chart (order not valid for inpatient use)     Chief Complaint  Patient presents with   Acute Visit    Back spasms    HPI:  Pt is a 81 y.o. female seen today for acute visit due to back spasms.   05/21 she fell in the cafeteria. No apparent injury. No head injury. Vitals stable. She reports increased back spasms to lower back x 1 day. Pain rated 3/10, described  as periods of tightness and cramping. Back pain does radiate to left buttocks at times. She is able to ambulate without difficulty after fall. She is currently taking tylenol 650 mg po bid. She does not think tylenol helps pain. Requesting muscle relaxer.    Past Medical History:  Diagnosis Date   Anemia    Arthritis    "fingers, back, shoulders, hips" (04/10/2016)   Chronic diastolic CHF (congestive heart failure) (HCC)    a. normal EF, [facility]EDP at [facility] in 6/17 33 >> Lasix started    CKD (chronic kidney disease), stage III (HCC)    Coronary artery disease    a. s/p CABG 2000 (SVG/ free LIMA Y graft to the diagonal and distal LAD, SVG to the OM 1, and SVG to the PDA) . b. Cath 12/19/2014 90% dSVG to RCA s/p 2 overlapping DES. c. LHC 2016, 2017 no intervention except diuresis needed. d. Low risk nuc 03/2016. e. Cath 12/2017 patent LIMA-LAD, SVG-OM, SVG-PDA but occluded SVG to diag.    Depression    with anxious component   GERD (gastroesophageal reflux disease)    History of hiatal hernia    Hyperlipidemia    Hypothyroidism    Obesity    OSA on CPAP    PMR (polymyalgia rheumatica) (HCC)    Pneumonia    "2-3 times" (04/10/2016)   Stable angina (HCC)    microvascular, improved with Ranexa   Subclavian artery stenosis ([place])    a. L by cath note in 2016.   TIA (transient ischemic attack)    Past Surgical History:  Procedure  Laterality Date   BREAST BIOPSY Right 1964   CARDIAC CATHETERIZATION  08   patent grafts, no culprit lesions, EF 65%   CARDIAC CATHETERIZATION N/A 12/19/2014   Procedure: Left Heart Cath and Cors/Grafts Angiography;  Surgeon: Jettie Booze, MD;  Location: [place] [facility] LAB;  Service: Cardiovascular;  Laterality: N/A;   CARDIAC CATHETERIZATION N/A 12/19/2014   Procedure: Coronary Stent Intervention;  Surgeon: Jettie Booze, MD;  Location: [place] [facility];  Service: Cardiovascular;  Laterality: N/A;   CARDIAC CATHETERIZATION N/A 01/01/2015   Procedure: Left  Heart Cath and Cors/Grafts Angiography;  Surgeon: Jettie Booze, MD;  Location: [place] [facility] LAB;  Service: Cardiovascular;  Laterality: N/A;   CARDIAC CATHETERIZATION N/A 09/20/2015   Procedure: Left Heart Cath and Cors/Grafts Angiography;  Surgeon: Jettie Booze, MD;  Location: [place] [facility] LAB;  Service: Cardiovascular;  Laterality: N/A;   CATARACT EXTRACTION W/ INTRAOCULAR LENS  IMPLANT, BILATERAL Bilateral 2011   COLONOSCOPY WITH PROPOFOL N/A 07/27/2016   Procedure: COLONOSCOPY WITH PROPOFOL;  Surgeon: Wilford Corner, MD;  Location: [facility];  Service: Endoscopy;  Laterality: N/A;   CORONARY ARTERY BYPASS GRAFT  2000   ASCVD, multivessel, S./P.   DILATION AND CURETTAGE OF UTERUS  1980s   ESOPHAGOGASTRODUODENOSCOPY (EGD) WITH PROPOFOL N/A 07/27/2016   Procedure: ESOPHAGOGASTRODUODENOSCOPY (EGD) WITH PROPOFOL;  Surgeon: Wilford Corner, MD;  Location: [place];  Service: Endoscopy;  Laterality: N/A;   FRACTURE SURGERY     LEFT HEART CATH AND CORS/GRAFTS ANGIOGRAPHY N/A 01/12/2018   Procedure: LEFT HEART CATH AND CORS/GRAFTS ANGIOGRAPHY;  Surgeon: Jettie Booze, MD;  Location: [place] [facility];  Service: Cardiovascular;  Laterality: N/A;   PATELLA FRACTURE SURGERY Left 1993    Allergies  Allergen Reactions   Nsaids Other (See Comments)    Due to chronic kidney failure   Butorphanol Other (See Comments)    agitation Constipation Produced a lot of urine, made patient feel crazy   Sulfa Antibiotics Rash   Bisoprolol Fumarate Other (See Comments)    Doesn't remember   Butorphanol Tartrate Other (See Comments)    Produced a lot of urine, made patient feel crazy   Codeine Nausea And Vomiting   Demerol [Meperidine] Nausea And Vomiting   Imipramine     Sweating, facial dysfunction    Meperidine And Related Nausea And Vomiting   Statins     MYALGIAS   Tequin [Gatifloxacin] Other (See Comments)    Caused hypoglycemia Low blood sugar   Brilinta  [Ticagrelor] Rash    CAUSES PETECHIAE, PURPURA   Pseudoephedrine Hcl Palpitations   Septra [Sulfamethoxazole-Trimethoprim] Rash   Sulfamethoxazole-Trimethoprim Rash    Outpatient Encounter Medications as of 08/13/2021  Medication Sig   acetaminophen (TYLENOL) 325 MG tablet Take 650 mg by mouth in the morning and at bedtime.   alum & mag hydroxide-simeth (MAALOX PLUS) 400-400-40 MG/5ML suspension Take 10 mLs by mouth as needed for indigestion.   atorvastatin (LIPITOR) 10 MG tablet TAKE 1 TABLET ONCE DAILY.   calcium carbonate (OS-CAL) 600 MG TABS tablet Take 600 mg by mouth 2 (two) times daily with a meal.   Cholecalciferol (VITAMIN D) 50 MCG (2000 UT) tablet Take 2,000 Units by mouth daily.   clopidogrel (PLAVIX) 75 MG tablet TAKE 1 TABLET ONCE DAILY.   COENZYME Q-10 PO Take 10 mg by mouth at bedtime.   cyanocobalamin 1000 MCG tablet Take 1,000 mcg by mouth daily.   fluticasone (FLONASE) 50 MCG/ACT nasal spray Place 2 sprays into  both nostrils daily as needed for allergies.    Glucosamine-Chondroit-Vit C-Mn (GLUCOSAMINE 1500 COMPLEX) CAPS Take 3 capsules by mouth daily after breakfast.    isosorbide mononitrate (IMDUR) 60 MG 24 hr tablet Take 2 tablets (120 mg total) by mouth daily.   levocetirizine (XYZAL) 5 MG tablet TAKE 1 TABLET ONCE DAILY IN THE EVENING.   levothyroxine (SYNTHROID) 75 MCG tablet TAKE 1 TABLET ONCE DAILY ON EMPTY STOMACH.   Melatonin 1 MG TABS Take by mouth at bedtime.   Menthol, Topical Analgesic, (BIOFREEZE) 10 % LIQD Apply topically as needed.   metoprolol succinate (TOPROL-XL) 25 MG 24 hr tablet Take 25 mg by mouth daily. Take with or immediately following a meal.   MYRBETRIQ 50 MG TB24 tablet TAKE 1 TABLET BY MOUTH DAILY.   nitroGLYCERIN (NITROSTAT) 0.4 MG SL tablet Place 0.4 mg under the tongue every 5 (five) minutes as needed for chest pain. X 3 doses   nystatin (MYCOSTATIN/NYSTOP) powder Apply 1 application topically. apply underneath abd/breast folds As Needed    pantoprazole (PROTONIX) 40 MG tablet Take 1 tablet (40 mg total) by mouth daily.   predniSONE (DELTASONE) 5 MG tablet Take 5 mg by mouth every other day.   PSYLLIUM HUSK PO Take 0.4 g by mouth daily. May keep at bedside   ranolazine (RANEXA) 1000 MG SR tablet TAKE 1 TABLET BY MOUTH TWICE DAILY.   venlafaxine (EFFEXOR) 75 MG tablet Take 150 mg by mouth. 2 caps= 150 mg; oral  Once A Day   zinc oxide 20 % ointment Apply 1 application topically as needed for irritation.   No facility-administered encounter medications on file as of 08/13/2021.    Review of Systems  Constitutional:  Negative for activity change, appetite change, fatigue and fever.  Respiratory:  Negative for cough, shortness of breath and wheezing.   Cardiovascular:  Negative for chest pain and leg swelling.  Gastrointestinal:  Negative for abdominal distention, abdominal pain, constipation, diarrhea, nausea and vomiting.  Musculoskeletal:  Positive for arthralgias, back pain and gait problem.  Skin:  Negative for wound.  Psychiatric/Behavioral:  Positive for confusion. Negative for dysphoric mood. The patient is not nervous/anxious.    Immunization History  Administered Date(s) Administered   HPV Bivalent 11/24/2013, 01/17/2016   Influenza Whole 12/23/2017   Influenza, High Dose Seasonal PF 12/24/2018, 12/24/2018   Influenza-Unspecified 01/10/2013, 12/27/2014, 12/30/2016, 01/02/2020, 01/09/2021   Moderna Sars-Covid-2 Vaccination 03/25/2019, 04/22/2019, 01/30/2020, 08/20/2020, 12/10/2020   Pneumococcal Conjugate-13 04/21/2013   Pneumococcal Polysaccharide-23 02/27/1993, 03/23/2009   Tdap 04/20/2012   Zoster Recombinat (Shingrix) 03/29/2021   Zoster, Live 11/10/2010   Pertinent  Health Maintenance Due  Topic Date Due   INFLUENZA VACCINE  10/21/2021   DEXA SCAN  Completed      10/14/2018    9:00 AM 10/14/2018    8:00 PM 10/15/2018    8:00 AM 02/10/2019    9:44 AM 03/27/2021    9:56 PM  Fall Risk  Falls in the past  year?    0   Patient Fall Risk Level High fall risk High fall risk Moderate fall risk  Moderate fall risk   Functional Status Survey:    Vitals:   08/13/21 1107  BP: (!) 145/47  Pulse: 66  Resp: 16  Temp: (!) 97.5 F (36.4 C)  SpO2: 94%  Weight: 196 lb 3.2 oz (89 kg)  Height: '4\' 10"'$  (1.473 m)   Body mass index is 41.01 kg/m. Physical Exam Vitals reviewed.  Constitutional:  General: She is not in acute distress. HENT:     Head: Atraumatic.  Eyes:     General:        Right eye: No discharge.        Left eye: No discharge.  Cardiovascular:     Rate and Rhythm: Normal rate and regular rhythm.     Pulses: Normal pulses.     Heart sounds: Normal heart sounds.  Pulmonary:     Effort: Pulmonary effort is normal. No respiratory distress.     Breath sounds: Normal breath sounds. No wheezing.  Abdominal:     General: Bowel sounds are normal. There is no distension.     Palpations: Abdomen is soft.     Tenderness: There is no abdominal tenderness. There is no right CVA tenderness or left CVA tenderness.  Musculoskeletal:     Cervical back: Normal and neck supple.     Thoracic back: Normal.     Lumbar back: Spasms and tenderness present. No swelling, deformity or signs of trauma. Normal range of motion.     Right lower leg: No edema.     Left lower leg: No edema.  Skin:    General: Skin is warm and dry.     Capillary Refill: Capillary refill takes less than 2 seconds.  Neurological:     General: No focal deficit present.     Mental Status: She is alert. Mental status is at baseline.     Motor: Weakness present.     Gait: Gait abnormal.  Psychiatric:        Mood and Affect: Mood normal.        Behavior: Behavior normal.    Labs reviewed: Recent Labs    08/24/20 0000 04/01/21 0000 06/06/21 0000  NA 139 141 138  K 4.2 4.8 4.5  CL 107 106 107  CO2 23* 27* 23*  BUN 25* 24* 25*  CREATININE 1.2* 1.2* 1.0  CALCIUM 8.3* 8.6* 9.0   Recent Labs    08/24/20 0000  04/01/21 0000 06/06/21 0000  AST 9* 10* 12*  ALT 7 6* 11  ALKPHOS 42 39 52  ALBUMIN 3.4* 3.4* 3.7   Recent Labs    08/24/20 0000 09/12/20 0000 04/01/21 0000 05/23/21 0000 06/06/21 0000  WBC 11.0   < > 8.8 9.3 10.8  NEUTROABS 7,535.00  --  5,315.00  --  7,711.00  HGB 9.9*   < > 9.8* 10.1* 10.3*  HCT 30*   < > 30* 31* 32*  PLT 245   < > 238 263 269   < > = values in this interval not displayed.   Lab Results  Component Value Date   TSH 3.56 04/01/2021   Lab Results  Component Value Date   HGBA1C 5.1 02/14/2019   Lab Results  Component Value Date   CHOL 151 08/24/2020   HDL 57 08/24/2020   LDLCALC 70 08/24/2020   TRIG 162 (A) 08/24/2020   CHOLHDL 2.1 06/07/2018    Significant Diagnostic Results in last 30 days:  No results found.  Assessment/Plan 1. Acute left-sided low back pain without sciatica - 05/21 she fell in cafeteria, no apparent injury - left sided lower back spasms x 1 day - tenderness noted to left lower back/buttocks - start robaxin 250 mg po TID PRN x 3 weeks for back spasms - cont tylenol - consider x ray spine if pain persists  2. Fall, initial encounter - see above - cont falls safety precautions  3. Gait  instability - see above - cont to ambulate with walker    Family/ staff Communication: plan discussed with patient and nurse  Labs/tests ordered:  none

## 2021-08-19 ENCOUNTER — Non-Acute Institutional Stay: Payer: Medicare Other | Admitting: Internal Medicine

## 2021-08-19 ENCOUNTER — Encounter: Payer: Self-pay | Admitting: Internal Medicine

## 2021-08-19 DIAGNOSIS — R2681 Unsteadiness on feet: Secondary | ICD-10-CM

## 2021-08-19 DIAGNOSIS — M545 Low back pain, unspecified: Secondary | ICD-10-CM | POA: Diagnosis not present

## 2021-08-19 DIAGNOSIS — I10 Essential (primary) hypertension: Secondary | ICD-10-CM

## 2021-08-19 DIAGNOSIS — W19XXXD Unspecified fall, subsequent encounter: Secondary | ICD-10-CM

## 2021-08-19 NOTE — Progress Notes (Unsigned)
Location:   [place] Room Number: Welcome:  ALF [phone]) Provider:  Veleta Miners MD   Mast, Man X, NP  Patient Care Team: Mast, Man X, NP as PCP - General (Internal Medicine) Jettie Booze, MD as PCP - Cardiology (Cardiology) Wilford Corner, MD as Consulting Physician (Gastroenterology) Marlou Sa, Tonna Corner, MD as Consulting Physician (Orthopedic Surgery) Marcial Pacas, MD as Consulting Physician (Neurology) Franchot Gallo, MD as Consulting Physician (Urology) Kary Kos, MD as Consulting Physician (Neurosurgery)  Extended Emergency Contact Information Primary Emergency Contact: Merlinda Frederick Address: [address]          [place] [address] [place] Phone: [phone] Mobile Phone: [phone] Relation: Spouse Secondary Emergency Contact: [place] Mobile Phone: [phone] Relation: Son  Code Status:  DNR Managed Care Goals of care: Advanced Directive information    08/19/2021    3:40 PM  Advanced Directives  Does Patient Have a Medical Advance Directive? Yes  Type of Advance Directive Out of facility DNR (pink MOST or yellow form);Living will  Does patient want to make changes to medical advance directive? No - Patient declined  Pre-existing out of facility DNR order (yellow form or pink MOST form) Pink MOST form placed in chart (order not valid for inpatient use);Yellow form placed in chart (order not valid for inpatient use)     Chief Complaint  Patient presents with   Acute Visit    Low back pain     HPI:  Pt is a 81 y.o. female seen today for an acute visit for Low Back Pain and muscle spasms   Patient has h/o Obesity with OSA,  Chronic Diastolic CHF, CAD s/p CABG 2000 and stent placement, PMR on chronic Steroids and Hypothyroidism,  Patient continues to be on chronic prednisone for her  PMR.  Previous tapering has failed.  Follows with rheumatology  She [place] in  [facility] room when she tripped and lost her balance Denies feeling dizzy or LOC Was seen By Amy and started on Robaxin which has helped but she thinks she need higher dose as she does not get complete relief with lower dose and still having Muscle spasms Pain is not radiating down her leg She is walking with her walker Sleeping well Pain is sporadic when she feels spasms     Past Medical History:  Diagnosis Date   Anemia    Arthritis    "fingers, back, shoulders, hips" (04/10/2016)   Chronic diastolic CHF (congestive heart failure) ([place])    a. normal EF, LVEDP at [facility] in 6/17 33 >> Lasix started    CKD (chronic kidney disease), stage III ([place])    Coronary artery disease    a. s/p CABG 2000 (SVG/ free LIMA Y graft to the diagonal and distal LAD, SVG to the OM 1, and SVG to the PDA) . b. Cath 12/19/2014 90% dSVG to RCA s/p 2 overlapping DES. c. LHC 2016, 2017 no intervention except diuresis needed. d. Low risk nuc 03/2016. e. Cath 12/2017 patent LIMA-LAD, SVG-OM, SVG-PDA but occluded SVG to diag.    Depression    with anxious component   GERD (gastroesophageal reflux disease)    History of hiatal hernia    Hyperlipidemia    Hypothyroidism    Obesity    OSA on CPAP    PMR (polymyalgia rheumatica) (HCC)    Pneumonia    "2-3 times" (04/10/2016)   Stable angina (HCC)    microvascular, improved  with Ranexa   Subclavian artery stenosis ([place])    a. L by cath note in 2016.   TIA (transient ischemic attack)    Past Surgical History:  Procedure Laterality Date   BREAST BIOPSY Right 1964   CARDIAC CATHETERIZATION  08   patent grafts, no culprit lesions, EF 65%   CARDIAC CATHETERIZATION N/A 12/19/2014   Procedure: Left Heart Cath and Cors/Grafts Angiography;  Surgeon: Jettie Booze, MD;  Location: [place] CV LAB;  Service: Cardiovascular;  Laterality: N/A;   CARDIAC CATHETERIZATION N/A 12/19/2014   Procedure: Coronary Stent Intervention;  Surgeon: Jettie Booze, MD;  Location: [place] [facility];  Service: Cardiovascular;  Laterality: N/A;   CARDIAC CATHETERIZATION N/A 01/01/2015   Procedure: Left Heart Cath and Cors/Grafts Angiography;  Surgeon: Jettie Booze, MD;  Location: [place] [facility];  Service: Cardiovascular;  Laterality: N/A;   CARDIAC CATHETERIZATION N/A 09/20/2015   Procedure: Left Heart Cath and Cors/Grafts Angiography;  Surgeon: Jettie Booze, MD;  Location: [place] [facility];  Service: Cardiovascular;  Laterality: N/A;   CATARACT EXTRACTION W/ INTRAOCULAR LENS  IMPLANT, BILATERAL Bilateral 2011   COLONOSCOPY WITH PROPOFOL N/A 07/27/2016   Procedure: COLONOSCOPY WITH PROPOFOL;  Surgeon: Wilford Corner, MD;  Location: [place];  Service: Endoscopy;  Laterality: N/A;   CORONARY ARTERY BYPASS GRAFT  2000   ASCVD, multivessel, S./P.   DILATION AND CURETTAGE OF UTERUS  1980s   ESOPHAGOGASTRODUODENOSCOPY (EGD) WITH PROPOFOL N/A 07/27/2016   Procedure: ESOPHAGOGASTRODUODENOSCOPY (EGD) WITH PROPOFOL;  Surgeon: Wilford Corner, MD;  Location: [place];  Service: Endoscopy;  Laterality: N/A;   FRACTURE SURGERY     LEFT HEART CATH AND CORS/GRAFTS ANGIOGRAPHY N/A 01/12/2018   Procedure: LEFT HEART CATH AND CORS/GRAFTS ANGIOGRAPHY;  Surgeon: Jettie Booze, MD;  Location: [place] [facility];  Service: Cardiovascular;  Laterality: N/A;   PATELLA FRACTURE SURGERY Left 1993    Allergies  Allergen Reactions   Nsaids Other (See Comments)    Due to chronic kidney failure   Butorphanol Other (See Comments)    agitation Constipation Produced a lot of urine, made patient feel crazy   Sulfa Antibiotics Rash   Bisoprolol Fumarate Other (See Comments)    Doesn't remember   Butorphanol Tartrate Other (See Comments)    Produced a lot of urine, made patient feel crazy   Codeine Nausea And Vomiting   Demerol [Meperidine] Nausea And Vomiting   Imipramine     Sweating, facial dysfunction    Meperidine And Related Nausea And Vomiting   Statins      MYALGIAS   Tequin [Gatifloxacin] Other (See Comments)    Caused hypoglycemia Low blood sugar   Brilinta [Ticagrelor] Rash    CAUSES PETECHIAE, PURPURA   Pseudoephedrine Hcl Palpitations   Septra [Sulfamethoxazole-Trimethoprim] Rash   Sulfamethoxazole-Trimethoprim Rash    Allergies as of 08/19/2021       Reactions   Nsaids Other (See Comments)   Due to chronic kidney failure   Butorphanol Other (See Comments)   agitation Constipation Produced a lot of urine, made patient feel crazy   Sulfa Antibiotics Rash   Bisoprolol Fumarate Other (See Comments)   Doesn't remember   Butorphanol Tartrate Other (See Comments)   Produced a lot of urine, made patient feel crazy   Codeine Nausea And Vomiting   Demerol [meperidine] Nausea And Vomiting   Imipramine    Sweating, facial dysfunction    Meperidine And Related Nausea And Vomiting   Statins  MYALGIAS   Tequin [gatifloxacin] Other (See Comments)   Caused hypoglycemia Low blood sugar   Brilinta [ticagrelor] Rash   CAUSES PETECHIAE, PURPURA   Pseudoephedrine Hcl Palpitations   Septra [sulfamethoxazole-trimethoprim] Rash   Sulfamethoxazole-trimethoprim Rash        Medication List        Accurate as of Aug 19, 2021  3:40 PM. If you have any questions, ask your nurse or doctor.          STOP taking these medications    Glucosamine 1500 Complex Caps Stopped by: Virgie Dad, MD       TAKE these medications    acetaminophen 500 MG tablet Commonly known as: TYLENOL Take 1,000 mg by mouth 2 (two) times daily as needed.   alum & mag hydroxide-simeth 782-956-21 MG/5ML suspension Commonly known as: MAALOX PLUS Take 10 mLs by mouth as needed for indigestion.   atorvastatin 10 MG tablet Commonly known as: LIPITOR TAKE 1 TABLET ONCE DAILY.   Biofreeze 10 % Liqd Generic drug: Menthol (Topical Analgesic) Apply topically as needed.   calcium carbonate 600 MG Tabs tablet Commonly known as: OS-CAL Take 600 mg  by mouth 2 (two) times daily with a meal.   clopidogrel 75 MG tablet Commonly known as: PLAVIX TAKE 1 TABLET ONCE DAILY.   COENZYME Q-10 PO Take 10 mg by mouth at bedtime.   cyanocobalamin 1000 MCG tablet Take 1,000 mcg by mouth daily.   fluticasone 50 MCG/ACT nasal spray Commonly known as: FLONASE Place 2 sprays into both nostrils daily as needed for allergies.   isosorbide mononitrate 60 MG 24 hr tablet Commonly known as: IMDUR Take 2 tablets (120 mg total) by mouth daily.   levocetirizine 5 MG tablet Commonly known as: XYZAL TAKE 1 TABLET ONCE DAILY IN THE EVENING.   levothyroxine 75 MCG tablet Commonly known as: SYNTHROID TAKE 1 TABLET ONCE DAILY ON EMPTY STOMACH.   melatonin 1 MG Tabs tablet Take by mouth at bedtime.   memantine tablet pack Commonly known as: NAMENDA TITRATION PACK Take 5-10 mg by mouth 2 (two) times daily.   methocarbamol 500 MG tablet Commonly known as: ROBAXIN Take 500 mg by mouth every 6 (six) hours as needed for muscle spasms. What changed: Another medication with the same name was removed. Continue taking this medication, and follow the directions you see here. Changed by: Virgie Dad, MD   metoprolol succinate 25 MG 24 hr tablet Commonly known as: TOPROL-XL Take 25 mg by mouth daily. Take with or immediately following a meal.   Myrbetriq 50 MG Tb24 tablet Generic drug: mirabegron ER TAKE 1 TABLET BY MOUTH DAILY.   nitroGLYCERIN 0.4 MG SL tablet Commonly known as: NITROSTAT Place 0.4 mg under the tongue every 5 (five) minutes as needed for chest pain. X 3 doses   nystatin powder Commonly known as: MYCOSTATIN/NYSTOP Apply 1 application topically. apply underneath abd/breast folds As Needed   pantoprazole 40 MG tablet Commonly known as: PROTONIX Take 1 tablet (40 mg total) by mouth daily.   predniSONE 5 MG tablet Commonly known as: DELTASONE Take 5 mg by mouth daily.   PSYLLIUM HUSK PO Take 0.4 g by mouth daily. May keep  at bedside   ranolazine 1000 MG SR tablet Commonly known as: RANEXA TAKE 1 TABLET BY MOUTH TWICE DAILY.   Salonpas 3.03-28-08 % Ptch Generic drug: Camphor-Menthol-Methyl Sal Apply 1 patch topically 2 (two) times daily.   venlafaxine 75 MG tablet Commonly known as: EFFEXOR Take 150  mg by mouth. 2 caps= 150 mg; oral  Once A Day   Vitamin D 50 MCG (2000 UT) tablet Take 2,000 Units by mouth daily.   zinc oxide 20 % ointment Apply 1 application topically as needed for irritation.        Review of Systems  Immunization History  Administered Date(s) Administered   HPV Bivalent 11/24/2013, 01/17/2016   Influenza Whole 12/23/2017   Influenza, High Dose Seasonal PF 12/24/2018, 12/24/2018   Influenza-Unspecified 01/10/2013, 12/27/2014, 12/30/2016, 01/02/2020, 01/09/2021   Moderna Sars-Covid-2 Vaccination 03/25/2019, 04/22/2019, 01/30/2020, 08/20/2020, 12/10/2020   Pneumococcal Conjugate-13 04/21/2013   Pneumococcal Polysaccharide-23 02/27/1993, 03/23/2009   Tdap 04/20/2012   Zoster Recombinat (Shingrix) 03/29/2021   Zoster, Live 11/10/2010   Pertinent  Health Maintenance Due  Topic Date Due   INFLUENZA VACCINE  10/21/2021   DEXA SCAN  Completed      10/14/2018    9:00 AM 10/14/2018    8:00 PM 10/15/2018    8:00 AM 02/10/2019    9:44 AM 03/27/2021    9:56 PM  Fall Risk  Falls in the past year?    0   Patient Fall Risk Level High fall risk High fall risk Moderate fall risk  Moderate fall risk   Functional Status Survey:    Vitals:   08/19/21 1528  BP: (!) 145/47  Pulse: 66  Resp: 16  Temp: 98.9 F (37.2 C)  SpO2: 93%  Weight: 196 lb 3.2 oz (89 kg)  Height: '4\' 10"'$  (1.473 m)   Body mass index is 41.01 kg/m. Physical Exam  Labs reviewed: Recent Labs    08/24/20 0000 04/01/21 0000 06/06/21 0000  NA 139 141 138  K 4.2 4.8 4.5  CL 107 106 107  CO2 23* 27* 23*  BUN 25* 24* 25*  CREATININE 1.2* 1.2* 1.0  CALCIUM 8.3* 8.6* 9.0   Recent Labs     08/24/20 0000 04/01/21 0000 06/06/21 0000  AST 9* 10* 12*  ALT 7 6* 11  ALKPHOS 42 39 52  ALBUMIN 3.4* 3.4* 3.7   Recent Labs    08/24/20 0000 09/12/20 0000 04/01/21 0000 05/23/21 0000 06/06/21 0000  WBC 11.0   < > 8.8 9.3 10.8  NEUTROABS 7,535.00  --  5,315.00  --  7,711.00  HGB 9.9*   < > 9.8* 10.1* 10.3*  HCT 30*   < > 30* 31* 32*  PLT 245   < > 238 263 269   < > = values in this interval not displayed.   Lab Results  Component Value Date   TSH 3.56 04/01/2021   Lab Results  Component Value Date   HGBA1C 5.1 02/14/2019   Lab Results  Component Value Date   CHOL 151 08/24/2020   HDL 57 08/24/2020   LDLCALC 70 08/24/2020   TRIG 162 (A) 08/24/2020   CHOLHDL 2.1 06/07/2018    Significant Diagnostic Results in last 30 days:  No results found.  Assessment/Plan There are no diagnoses linked to this encounter.   Family/ staff Communication:   Labs/tests ordered:

## 2021-08-25 ENCOUNTER — Encounter: Payer: Self-pay | Admitting: Adult Health

## 2021-08-25 ENCOUNTER — Other Ambulatory Visit: Payer: Self-pay | Admitting: Adult Health

## 2021-08-25 ENCOUNTER — Non-Acute Institutional Stay: Payer: Medicare Other | Admitting: Adult Health

## 2021-08-25 DIAGNOSIS — N1831 Chronic kidney disease, stage 3a: Secondary | ICD-10-CM | POA: Diagnosis not present

## 2021-08-25 DIAGNOSIS — M545 Low back pain, unspecified: Secondary | ICD-10-CM

## 2021-08-25 DIAGNOSIS — F339 Major depressive disorder, recurrent, unspecified: Secondary | ICD-10-CM | POA: Diagnosis not present

## 2021-08-25 MED ORDER — TIZANIDINE HCL 2 MG PO TABS: 2.0000 mg | ORAL_TABLET | Freq: Three times a day (TID) | ORAL | 0 refills | Status: DC | PRN

## 2021-08-25 NOTE — Progress Notes (Unsigned)
Location:  [place] Room Number: 819-A Place of Service:  ALF [phone]) Provider:  Durenda Age, DNP, FNP-BC  Patient Care Team: Mast, Man X, NP as PCP - General (Internal Medicine) Jettie Booze, MD as PCP - Cardiology (Cardiology) Wilford Corner, MD as Consulting Physician (Gastroenterology) Marlou Sa Tonna Corner, MD as Consulting Physician (Orthopedic Surgery) Marcial Pacas, MD as Consulting Physician (Neurology) Franchot Gallo, MD as Consulting Physician (Urology) Kary Kos, MD as Consulting Physician (Neurosurgery)  Extended Emergency Contact Information Primary Emergency Contact: Merlinda Frederick Address: [address]          [place] [address] [place] Phone: [phone] Mobile Phone: [phone] Relation: Spouse Secondary Emergency Contact: [place] Mobile Phone: [phone] Relation: Son  Code Status:  DNR  Goals of care: Advanced Directive information    08/25/2021   11:05 AM  Advanced Directives  Does Patient Have a Medical Advance Directive? Yes  Type of Paramedic of [place];Living will;Out of facility DNR (pink MOST or yellow form)  Does patient want to make changes to medical advance directive? No - Patient declined  Copy of [place] in Chart? Yes - validated most recent copy scanned in chart (See row information)  Pre-existing out of facility DNR order (yellow form or pink MOST form) Pink MOST/Yellow Form most recent copy in chart - Physician notified to receive inpatient order     Chief Complaint  Patient presents with   Acute Visit    Lower back pain    HPI:  Pt is a 81 y.o. female seen today for medical management of chronic diseases.  ***   Past Medical History:  Diagnosis Date   Anemia    Arthritis    "fingers, back, shoulders, hips" (04/10/2016)   Chronic diastolic CHF (congestive heart failure) ([facility])    a. normal  EF, LVEDP at [facility] in 6/17 33 >> Lasix started    CKD (chronic kidney disease), stage III (H[facility])    Coronary artery disease    a. s/p CABG 2000 (SVG/ free LIMA Y graft to the diagonal and distal LAD, SVG to the OM 1, and SVG to the PDA) . b. Cath 12/19/2014 90% dSVG to RCA s/p 2 overlapping DES. c. LHC 2016, 2017 no intervention except diuresis needed. d. Low risk nuc 03/2016. e. Cath 12/2017 patent LIMA-LAD, SVG-OM, SVG-PDA but occluded SVG to diag.    Depression    with anxious component   GERD (gastroesophageal reflux disease)    History of hiatal hernia    Hyperlipidemia    Hypothyroidism    Obesity    OSA on CPAP    PMR (polymyalgia rheumatica) (HCC)    Pneumonia    "2-3 times" (04/10/2016)   Stable angina (HCC)    microvascular, improved with Ranexa   Subclavian artery stenosis ([place])    a. L by cath note in 2016.   TIA (transient ischemic attack)    Past Surgical History:  Procedure Laterality Date   BREAST BIOPSY Right 1964   CARDIAC CATHETERIZATION  08   patent grafts, no culprit lesions, EF 65%   CARDIAC CATHETERIZATION N/A 12/19/2014   Procedure: Left Heart Cath and Cors/Grafts Angiography;  Surgeon: Jettie Booze, MD;  Location: [place] [facility];  Service: Cardiovascular;  Laterality: N/A;   CARDIAC CATHETERIZATION N/A 12/19/2014   Procedure: Coronary Stent Intervention;  Surgeon: Jettie Booze, MD;  Location: [place] [facility];  Service: Cardiovascular;  Laterality: N/A;   CARDIAC CATHETERIZATION N/A 01/01/2015   Procedure: Left Heart Cath and Cors/Grafts Angiography;  Surgeon: Jettie Booze, MD;  Location: [place] [facility];  Service: Cardiovascular;  Laterality: N/A;   CARDIAC CATHETERIZATION N/A 09/20/2015   Procedure: Left Heart Cath and Cors/Grafts Angiography;  Surgeon: Jettie Booze, MD;  Location: [place] [facility];  Service: Cardiovascular;  Laterality: N/A;   CATARACT EXTRACTION W/ INTRAOCULAR LENS  IMPLANT, BILATERAL Bilateral 2011    COLONOSCOPY WITH PROPOFOL N/A 07/27/2016   Procedure: COLONOSCOPY WITH PROPOFOL;  Surgeon: Wilford Corner, MD;  Location: [place];  Service: Endoscopy;  Laterality: N/A;   CORONARY ARTERY BYPASS GRAFT  2000   ASCVD, multivessel, S./P.   DILATION AND CURETTAGE OF UTERUS  1980s   ESOPHAGOGASTRODUODENOSCOPY (EGD) WITH PROPOFOL N/A 07/27/2016   Procedure: ESOPHAGOGASTRODUODENOSCOPY (EGD) WITH PROPOFOL;  Surgeon: Wilford Corner, MD;  Location: [place];  Service: Endoscopy;  Laterality: N/A;   FRACTURE SURGERY     LEFT HEART CATH AND CORS/GRAFTS ANGIOGRAPHY N/A 01/12/2018   Procedure: LEFT HEART CATH AND CORS/GRAFTS ANGIOGRAPHY;  Surgeon: Jettie Booze, MD;  Location: [place] [facility];  Service: Cardiovascular;  Laterality: N/A;   PATELLA FRACTURE SURGERY Left 1993    Allergies  Allergen Reactions   Nsaids Other (See Comments)    Due to chronic kidney failure   Butorphanol Other (See Comments)    agitation Constipation Produced a lot of urine, made patient feel crazy   Sulfa Antibiotics Rash   Bisoprolol Fumarate Other (See Comments)    Doesn't remember   Butorphanol Tartrate Other (See Comments)    Produced a lot of urine, made patient feel crazy   Codeine Nausea And Vomiting   Demerol [Meperidine] Nausea And Vomiting   Imipramine     Sweating, facial dysfunction    Meperidine And Related Nausea And Vomiting   Statins     MYALGIAS   Tequin [Gatifloxacin] Other (See Comments)    Caused hypoglycemia Low blood sugar   Brilinta [Ticagrelor] Rash    CAUSES PETECHIAE, PURPURA   Pseudoephedrine Hcl Palpitations   Septra [Sulfamethoxazole-Trimethoprim] Rash   Sulfamethoxazole-Trimethoprim Rash    Outpatient Encounter Medications as of 08/25/2021  Medication Sig   acetaminophen (TYLENOL) 500 MG tablet Take 1,000 mg by mouth 2 (two) times daily as needed.   alum & mag hydroxide-simeth (MAALOX PLUS) 400-400-40 MG/5ML suspension Take 10 mLs by mouth as needed for  indigestion.   atorvastatin (LIPITOR) 10 MG tablet TAKE 1 TABLET ONCE DAILY.   calcium carbonate (OS-CAL) 600 MG TABS tablet Take 600 mg by mouth 2 (two) times daily with a meal.   Camphor-Menthol-Methyl Sal (SALONPAS) 3.03-28-08 % PTCH Apply 1 patch topically 2 (two) times daily.   Cholecalciferol (VITAMIN D) 50 MCG (2000 UT) tablet Take 2,000 Units by mouth daily.   clopidogrel (PLAVIX) 75 MG tablet TAKE 1 TABLET ONCE DAILY.   COENZYME Q-10 PO Take 10 mg by mouth at bedtime.   cyanocobalamin 1000 MCG tablet Take 1,000 mcg by mouth daily.   fluticasone (FLONASE) 50 MCG/ACT nasal spray Place 2 sprays into both nostrils daily as needed for allergies.    isosorbide mononitrate (IMDUR) 60 MG 24 hr tablet Take 2 tablets (120 mg total) by mouth daily.   levocetirizine (XYZAL) 5 MG tablet TAKE 1 TABLET ONCE DAILY IN THE EVENING.   levothyroxine (SYNTHROID) 75 MCG tablet TAKE 1 TABLET ONCE DAILY ON EMPTY STOMACH.   Melatonin 1 MG TABS Take by mouth at bedtime.  memantine (NAMENDA TITRATION PACK) tablet pack Take 5-10 mg by mouth 2 (two) times daily.   Menthol, Topical Analgesic, (BIOFREEZE) 10 % LIQD Apply topically as needed.   methocarbamol (ROBAXIN) 500 MG tablet Take 500 mg by mouth every 6 (six) hours as needed for muscle spasms (Back Pain).   metoprolol succinate (TOPROL-XL) 25 MG 24 hr tablet Take 25 mg by mouth daily. Take with or immediately following a meal.   MYRBETRIQ 50 MG TB24 tablet TAKE 1 TABLET BY MOUTH DAILY.   nitroGLYCERIN (NITROSTAT) 0.4 MG SL tablet Place 0.4 mg under the tongue every 5 (five) minutes as needed for chest pain. X 3 doses   nystatin (MYCOSTATIN/NYSTOP) powder Apply 1 application topically. apply underneath abd/breast folds As Needed   pantoprazole (PROTONIX) 40 MG tablet Take 1 tablet (40 mg total) by mouth daily.   predniSONE (DELTASONE) 5 MG tablet Take 5 mg by mouth daily.   PSYLLIUM HUSK PO Take 0.4 g by mouth daily. May keep at bedside   ranolazine (RANEXA)  1000 MG SR tablet TAKE 1 TABLET BY MOUTH TWICE DAILY.   venlafaxine (EFFEXOR) 75 MG tablet Take 150 mg by mouth. 2 caps= 150 mg; oral  Once A Day   zinc oxide 20 % ointment Apply 1 application topically as needed for irritation.   tiZANidine (ZANAFLEX) 2 MG tablet Take 1 tablet (2 mg total) by mouth every 8 (eight) hours as needed for muscle spasms. (Patient not taking: Reported on 08/25/2021)   [DISCONTINUED] methocarbamol (ROBAXIN) 500 MG tablet Take 500 mg by mouth every 6 (six) hours as needed for muscle spasms.   No facility-administered encounter medications on file as of 08/25/2021.    Review of Systems ***    Immunization History  Administered Date(s) Administered   HPV Bivalent 11/24/2013, 01/17/2016   Influenza Whole 12/23/2017   Influenza, High Dose Seasonal PF 12/24/2018, 12/24/2018   Influenza-Unspecified 01/10/2013, 12/27/2014, 12/30/2016, 01/02/2020, 01/09/2021   Moderna SARS-COV2 Booster Vaccination 08/08/2021   Moderna Sars-Covid-2 Vaccination 03/25/2019, 04/22/2019, 01/30/2020, 08/20/2020, 12/10/2020   Pneumococcal Conjugate-13 04/21/2013   Pneumococcal Polysaccharide-23 02/27/1993, 03/23/2009   Tdap 04/20/2012   Zoster Recombinat (Shingrix) 03/29/2021, 06/09/2021   Zoster, Live 11/10/2010   Pertinent  Health Maintenance Due  Topic Date Due   INFLUENZA VACCINE  10/21/2021   DEXA SCAN  Completed      10/14/2018    9:00 AM 10/14/2018    8:00 PM 10/15/2018    8:00 AM 02/10/2019    9:44 AM 03/27/2021    9:56 PM  Fall Risk  Falls in the past year?    0   Patient Fall Risk Level High fall risk High fall risk Moderate fall risk  Moderate fall risk     Vitals:   08/25/21 1107  BP: 124/82  Pulse: 82  Resp: 19  Temp: 97.7 F (36.5 C)  SpO2: 98%  Weight: 196 lb 3.2 oz (89 kg)  Height: '4\' 10"'$  (1.473 m)   Body mass index is 41.01 kg/m.  Physical Exam     Labs reviewed: Recent Labs    04/01/21 0000 06/06/21 0000  NA 141 138  K 4.8 4.5  CL 106 107   CO2 27* 23*  BUN 24* 25*  CREATININE 1.2* 1.0  CALCIUM 8.6* 9.0   Recent Labs    04/01/21 0000 06/06/21 0000  AST 10* 12*  ALT 6* 11  ALKPHOS 39 52  ALBUMIN 3.4* 3.7   Recent Labs    04/01/21 0000 05/23/21 0000 06/06/21  0000  WBC 8.8 9.3 10.8  NEUTROABS 5,315.00  --  7,711.00  HGB 9.8* 10.1* 10.3*  HCT 30* 31* 32*  PLT 238 263 269   Lab Results  Component Value Date   TSH 3.56 04/01/2021   Lab Results  Component Value Date   HGBA1C 5.1 02/14/2019   Lab Results  Component Value Date   CHOL 151 08/24/2020   HDL 57 08/24/2020   LDLCALC 70 08/24/2020   TRIG 162 (A) 08/24/2020   CHOLHDL 2.1 06/07/2018    Significant Diagnostic Results in last 30 days:  No results found.  Assessment/Plan ***   Family/ staff Communication: Discussed plan of care with resident and charge nurse  Labs/tests ordered:     Durenda Age, DNP, MSN, FNP-BC [facility] [phone] (Monday-Friday 8:00 a.m. - 5:00 p.m.) [phone] (after hours)

## 2021-11-27 ENCOUNTER — Encounter: Payer: Self-pay | Admitting: Nurse Practitioner

## 2021-11-27 ENCOUNTER — Non-Acute Institutional Stay: Payer: Medicare Other | Admitting: Nurse Practitioner

## 2021-11-27 DIAGNOSIS — F339 Major depressive disorder, recurrent, unspecified: Secondary | ICD-10-CM

## 2021-11-27 DIAGNOSIS — M159 Polyosteoarthritis, unspecified: Secondary | ICD-10-CM

## 2021-11-27 DIAGNOSIS — R2681 Unsteadiness on feet: Secondary | ICD-10-CM | POA: Diagnosis not present

## 2021-11-27 DIAGNOSIS — G4733 Obstructive sleep apnea (adult) (pediatric): Secondary | ICD-10-CM

## 2021-11-27 DIAGNOSIS — I10 Essential (primary) hypertension: Secondary | ICD-10-CM | POA: Diagnosis not present

## 2021-11-27 DIAGNOSIS — N1831 Chronic kidney disease, stage 3a: Secondary | ICD-10-CM

## 2021-11-27 DIAGNOSIS — R413 Other amnesia: Secondary | ICD-10-CM

## 2021-11-27 DIAGNOSIS — E785 Hyperlipidemia, unspecified: Secondary | ICD-10-CM

## 2021-11-27 DIAGNOSIS — D519 Vitamin B12 deficiency anemia, unspecified: Secondary | ICD-10-CM

## 2021-11-27 DIAGNOSIS — R350 Frequency of micturition: Secondary | ICD-10-CM

## 2021-11-27 DIAGNOSIS — K219 Gastro-esophageal reflux disease without esophagitis: Secondary | ICD-10-CM

## 2021-11-27 DIAGNOSIS — I2510 Atherosclerotic heart disease of native coronary artery without angina pectoris: Secondary | ICD-10-CM

## 2021-11-27 DIAGNOSIS — M353 Polymyalgia rheumatica: Secondary | ICD-10-CM

## 2021-11-27 DIAGNOSIS — M8000XA Age-related osteoporosis with current pathological fracture, unspecified site, initial encounter for fracture: Secondary | ICD-10-CM

## 2021-11-27 DIAGNOSIS — E039 Hypothyroidism, unspecified: Secondary | ICD-10-CM

## 2021-11-27 NOTE — Progress Notes (Signed)
Location:  [place] [facility] Room Number: AL/911/A Place of Service:  ALF (13) Provider:    Manasvi Dickard X, NP  Patient Care Team: Garnet Chatmon X, NP as PCP - General (Internal Medicine) Jettie Booze, MD as PCP - Cardiology (Cardiology) Wilford Corner, MD as Consulting Physician (Gastroenterology) Marlou Sa Tonna Corner, MD as Consulting Physician (Orthopedic Surgery) Marcial Pacas, MD as Consulting Physician (Neurology) Franchot Gallo, MD as Consulting Physician (Urology) Kary Kos, MD as Consulting Physician (Neurosurgery)  Extended Emergency Contact Information Primary Emergency Contact: Merlinda Frederick Address: [address]          [place] [address] [place] Phone: [phone] Mobile Phone: [phone] Relation: Spouse Secondary Emergency Contact: [place] Mobile Phone: [phone] Relation: Son  Code Status:  DNR Goals of care: Advanced Directive information    08/25/2021   11:05 AM  Advanced Directives  Does Patient Have a Medical Advance Directive? Yes  Type of Paramedic of [place];Living will;Out of facility DNR (pink MOST or yellow form)  Does patient want to make changes to medical advance directive? No - Patient declined  Copy of [place] in Chart? Yes - validated most recent copy scanned in chart (See row information)  Pre-existing out of facility DNR order (yellow form or pink MOST form) Pink MOST/Yellow Form most recent copy in chart - Physician notified to receive inpatient order     Chief Complaint  Patient presents with   Acute Visit    Patient is being seen for elevated blood pressure    HPI:  Pt is a 81 y.o. female seen today for an acute visit for elevated Bp, denied HA, chest pain/pressure or SOB  HTN, on Metoprolol             Memory loss: MMSE 29/30 06/06/21, 07/02/21 CT head w/o CM, No acute abnormality no change from earlier this year.  Moderate atrophy and moderate chronic microvascular ischemic change in the white matter.   Gait abnormality, uses walker for ambulation.  CKD IIIa Bun/creat 25/1.0 06/06/21             CAD, on Isosorbide and Ranolazine, Plavix. LDL 70 08/24/20             Hypothyroidism, on Levothyroxine, TSH 3.56 04/01/21             Anemia, on B12, Iron 47 05/22/21, Hgb 10.3 06/06/21             Urinary frequency and occasional incontinent of urine, on Myrbetriq.             GERD, stable, on Pantoprazole.               Her mood is stable, on Venlafaxine, TSH 3.56 04/01/21             OA, takes Tylenol             OP, takes Ca, Vit D. 04/29/21 Dr. Dossie Der dc Fosamax.  05/07/21 DEXA t score -1.273             Vit B12 deficiency, takes Vit B12, Vit B12 162 08/03/19>> Vit B12 869 08/24/20             Hyperlipidemia, taking Atorvastatin.              PMR, takes Prednisone '5mg'$  qd(failed GDR)             OSA ,uses CPAP  Past Medical History:  Diagnosis Date   Anemia    Arthritis    "fingers, back, shoulders, hips" (04/10/2016)   Chronic diastolic CHF (congestive heart failure) (HCC)    a. normal EF, LVEDP at [facility] in 6/17 33 >> Lasix started    CKD (chronic kidney disease), stage III (HCC)    Coronary artery disease    a. s/p CABG 2000 (SVG/ free LIMA Y graft to the diagonal and distal LAD, SVG to the OM 1, and SVG to the PDA) . b. Cath 12/19/2014 90% dSVG to RCA s/p 2 overlapping DES. c. LHC 2016, 2017 no intervention except diuresis needed. d. Low risk nuc 03/2016. e. Cath 12/2017 patent LIMA-LAD, SVG-OM, SVG-PDA but occluded SVG to diag.    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 L by cath note in 2016.    TIA (transient ischemic attack)    Past Surgical History:  Procedure Laterality Date   BREAST BIOPSY Right 1964   CARDIAC CATHETERIZATION  08   patent grafts, no culprit lesions, EF 65%   CARDIAC CATHETERIZATION N/A 12/19/2014   Procedure: Left Heart Cath and Cors/Grafts Angiography;  Surgeon: Jettie Booze, MD;  Location: [facility];  Service: Cardiovascular;  Laterality: N/A;   CARDIAC CATHETERIZATION N/A 12/19/2014   Procedure: Coronary Stent Intervention;  Surgeon: Jettie Booze, MD;  Location: [facility];  Service: Cardiovascular;  Laterality: N/A;   CARDIAC CATHETERIZATION N/A 01/01/2015   Procedure: Left Heart Cath and Cors/Grafts Angiography;  Surgeon: Jettie Booze, MD;  Location: [facility];  Service: Cardiovascular;  Laterality: N/A;   CARDIAC CATHETERIZATION N/A 09/20/2015   Procedure: Left Heart Cath and Cors/Grafts Angiography;  Surgeon: Jettie Booze, MD;  Location: [facility];  Service: Cardiovascular;  Laterality: N/A;   CATARACT EXTRACTION W/ INTRAOCULAR LENS  IMPLANT, BILATERAL Bilateral 2011   COLONOSCOPY WITH PROPOFOL N/A 07/27/2016   Procedure: COLONOSCOPY WITH PROPOFOL;  Surgeon: Wilford Corner, MD;  Location: [facility];  Service: Endoscopy;  Laterality: N/A;   CORONARY ARTERY BYPASS GRAFT  2000   ASCVD, multivessel, S./P.   DILATION AND CURETTAGE OF UTERUS  1980s   ESOPHAGOGASTRODUODENOSCOPY (EGD) WITH PROPOFOL N/A 07/27/2016   Procedure: ESOPHAGOGASTRODUODENOSCOPY (EGD) WITH PROPOFOL;  Surgeon: Wilford Corner, MD;  Location: [facility];  Service: Endoscopy;  Laterality: N/A;   FRACTURE SURGERY     LEFT HEART CATH AND CORS/GRAFTS ANGIOGRAPHY N/A 01/12/2018   Procedure: LEFT HEART CATH AND CORS/GRAFTS ANGIOGRAPHY;  Surgeon: Jettie Booze, MD;  Location: [facility];  Service: Cardiovascular;  Laterality: N/A;   PATELLA FRACTURE SURGERY Left 1993    Allergies  Allergen Reactions   Nsaids  Other (See Comments)    Due to chronic kidney failure   Butorphanol Other (See Comments)    agitation Constipation Produced a lot of urine, made patient feel crazy   Sulfa Antibiotics Rash   Bisoprolol Fumarate Other (See Comments)    Doesn't remember   Butorphanol Tartrate Other (See Comments)    Produced a lot of urine, made patient feel crazy  Codeine Nausea And Vomiting   Demerol [Meperidine] Nausea And Vomiting   Imipramine     Sweating, facial dysfunction    Meperidine And Related Nausea And Vomiting   Statins     MYALGIAS   Tequin [Gatifloxacin] Other (See Comments)    Caused hypoglycemia Low blood sugar   Brilinta [Ticagrelor] Rash    CAUSES PETECHIAE, PURPURA   Pseudoephedrine Hcl Palpitations   Septra [Sulfamethoxazole-Trimethoprim] Rash   Sulfamethoxazole-Trimethoprim Rash    Outpatient Encounter Medications as of 11/27/2021  Medication Sig   acetaminophen (TYLENOL) 500 MG tablet Take 1,000 mg by mouth 2 (two) times daily as needed.   alum & mag hydroxide-simeth (MAALOX PLUS) 400-400-40 MG/5ML suspension Take 10 mLs by mouth as needed for indigestion.   atorvastatin (LIPITOR) 10 MG tablet TAKE 1 TABLET ONCE DAILY.   calcium carbonate (OS-CAL) 600 MG TABS tablet Take 600 mg by mouth 2 (two) times daily with a meal.   Camphor-Menthol-Methyl Sal (SALONPAS) 3.03-28-08 % PTCH Apply 1 patch topically 2 (two) times daily.   Cholecalciferol (VITAMIN D) 50 MCG (2000 UT) tablet Take 2,000 Units by mouth daily.   clopidogrel (PLAVIX) 75 MG tablet TAKE 1 TABLET ONCE DAILY.   COENZYME Q-10 PO Take 10 mg by mouth at bedtime.   cyanocobalamin 1000 MCG tablet Take 1,000 mcg by mouth daily.   fluticasone (FLONASE) 50 MCG/ACT nasal spray Place 2 sprays into both nostrils daily as needed for allergies.    isosorbide mononitrate (IMDUR) 60 MG 24 hr tablet Take 2 tablets (120 mg total) by mouth daily.   levocetirizine (XYZAL) 5 MG tablet TAKE 1 TABLET ONCE DAILY IN THE EVENING.    levothyroxine (SYNTHROID) 75 MCG tablet TAKE 1 TABLET ONCE DAILY ON EMPTY STOMACH.   Melatonin 1 MG TABS Take by mouth at bedtime.   memantine (NAMENDA TITRATION PACK) tablet pack Take 5-10 mg by mouth 2 (two) times daily.   Menthol, Topical Analgesic, (BIOFREEZE) 10 % LIQD Apply topically as needed.   methocarbamol (ROBAXIN) 500 MG tablet Take 500 mg by mouth every 6 (six) hours as needed for muscle spasms (Back Pain).   metoprolol succinate (TOPROL-XL) 25 MG 24 hr tablet Take 25 mg by mouth daily. Take with or immediately following a meal.   MYRBETRIQ 50 MG TB24 tablet TAKE 1 TABLET BY MOUTH DAILY.   nitroGLYCERIN (NITROSTAT) 0.4 MG SL tablet Place 0.4 mg under the tongue every 5 (five) minutes as needed for chest pain. X 3 doses   nystatin (MYCOSTATIN/NYSTOP) powder Apply 1 application topically. apply underneath abd/breast folds As Needed   pantoprazole (PROTONIX) 40 MG tablet Take 1 tablet (40 mg total) by mouth daily.   predniSONE (DELTASONE) 5 MG tablet Take 5 mg by mouth daily.   PSYLLIUM HUSK PO Take 0.4 g by mouth daily. May keep at bedside   ranolazine (RANEXA) 1000 MG SR tablet TAKE 1 TABLET BY MOUTH TWICE DAILY.   tiZANidine (ZANAFLEX) 2 MG tablet Take 1 tablet (2 mg total) by mouth every 8 (eight) hours as needed for muscle spasms. (Patient not taking: Reported on 08/25/2021)   venlafaxine (EFFEXOR) 75 MG tablet Take 150 mg by mouth. 2 caps= 150 mg; oral  Once A Day   zinc oxide 20 % ointment Apply 1 application topically as needed for irritation.   No facility-administered encounter medications on file as of 11/27/2021.    Review of Systems  Constitutional:  Negative for activity change, appetite change and fever.  HENT:  Positive for hearing loss.  Negative for congestion and voice change.   Eyes:  Negative for visual disturbance.  Respiratory:  Positive for shortness of breath. Negative for cough and wheezing.        DOE is chronic  Cardiovascular:  Negative for leg swelling.   Gastrointestinal:  Negative for abdominal pain and constipation.  Genitourinary:  Negative for dysuria, frequency and urgency.       1-2x/night bathroom trips.   Musculoskeletal:  Positive for arthralgias, gait problem and myalgias.  Skin:  Negative for color change.  Neurological:  Negative for speech difficulty, weakness and headaches.       Memory lapses.   Psychiatric/Behavioral:  Positive for sleep disturbance. Negative for hallucinations. The patient is not nervous/anxious.        Sometimes not sleeping well    Immunization History  Administered Date(s) Administered   HPV Bivalent 11/24/2013, 01/17/2016   Influenza Whole 12/23/2017   Influenza, High Dose Seasonal PF 12/24/2018, 12/24/2018   Influenza-Unspecified 01/10/2013, 12/27/2014, 12/30/2016, 01/02/2020, 01/09/2021   Moderna SARS-COV2 Booster Vaccination 08/08/2021   Moderna Sars-Covid-2 Vaccination 03/25/2019, 04/22/2019, 01/30/2020, 08/20/2020, 12/10/2020   Pneumococcal Conjugate-13 04/21/2013   Pneumococcal Polysaccharide-23 02/27/1993, 03/23/2009   Tdap 04/20/2012   Zoster Recombinat (Shingrix) 03/29/2021, 06/09/2021   Zoster, Live 11/10/2010   Pertinent  Health Maintenance Due  Topic Date Due   INFLUENZA VACCINE  10/21/2021   DEXA SCAN  Completed      10/14/2018    9:00 AM 10/14/2018    8:00 PM 10/15/2018    8:00 AM 02/10/2019    9:44 AM 03/27/2021    9:56 PM  Fall Risk  Falls in the past year?    0   Patient Fall Risk Level High fall risk High fall risk Moderate fall risk  Moderate fall risk   Functional Status Survey:    Vitals:   11/27/21 1022  BP: (!) 167/91  Pulse: 74  Resp: 18  Temp: 97.6 F (36.4 C)  SpO2: 98%  Weight: 195 lb (88.5 kg)  Height: '4\' 10"'$  (1.473 m)   Body mass index is 40.76 kg/m. Physical Exam Vitals and nursing note reviewed.  Constitutional:      Appearance: Normal appearance.  HENT:     Head: Normocephalic and atraumatic.     Mouth/Throat:     Mouth: Mucous membranes  are moist.  Eyes:     Extraocular Movements: Extraocular movements intact.     Conjunctiva/sclera: Conjunctivae normal.     Pupils: Pupils are equal, round, and reactive to light.  Cardiovascular:     Rate and Rhythm: Normal rate and regular rhythm.     Heart sounds:     No gallop.  Pulmonary:     Breath sounds: No rhonchi or rales.  Abdominal:     General: Bowel sounds are normal.     Palpations: Abdomen is soft.     Tenderness: There is no abdominal tenderness.  Musculoskeletal:     Cervical back: Normal range of motion and neck supple.     Right lower leg: No edema.     Left lower leg: No edema.  Skin:    General: Skin is warm and dry.  Neurological:     General: No focal deficit present.     Mental Status: She is alert and oriented to person, place, and time. Mental status is at baseline.     Motor: No weakness.     Coordination: Coordination normal.     Gait: Gait abnormal.  Psychiatric:  Mood and Affect: Mood normal.        Behavior: Behavior normal.        Thought Content: Thought content normal.        Judgment: Judgment normal.     Labs reviewed: Recent Labs    04/01/21 0000 06/06/21 0000  NA 141 138  K 4.8 4.5  CL 106 107  CO2 27* 23*  BUN 24* 25*  CREATININE 1.2* 1.0  CALCIUM 8.6* 9.0   Recent Labs    04/01/21 0000 06/06/21 0000  AST 10* 12*  ALT 6* 11  ALKPHOS 39 52  ALBUMIN 3.4* 3.7   Recent Labs    04/01/21 0000 05/23/21 0000 06/06/21 0000  WBC 8.8 9.3 10.8  NEUTROABS 5,315.00  --  7,711.00  HGB 9.8* 10.1* 10.3*  HCT 30* 31* 32*  PLT 238 263 269   Lab Results  Component Value Date   TSH 3.56 04/01/2021   Lab Results  Component Value Date   HGBA1C 5.1 02/14/2019   Lab Results  Component Value Date   CHOL 151 08/24/2020   HDL 57 08/24/2020   LDLCALC 70 08/24/2020   TRIG 162 (A) 08/24/2020   CHOLHDL 2.1 06/07/2018    Significant Diagnostic Results in last 30 days:  No results found.  Assessment/Plan Essential  hypertension elevated Bp, denied HA, chest pain/pressure or SOB, continue, continue Metoprolol, adding Losartan '25mg'$  qd, Bp daily.    Memory loss Memory loss: MMSE 29/30 06/06/21, 07/02/21 CT head w/o CM, No acute abnormality no change from earlier this year. Moderate atrophy and moderate chronic microvascular ischemic change in the white matter.  Gait instability uses walker for ambulation.   CKD (chronic kidney disease) stage 3, GFR 30-59 ml/min (HCC) Bun/creat 25/1.0 06/06/21  CAD (coronary artery disease) on Isosorbide and Ranolazine, Plavix. LDL 70 08/24/20  Hypothyroidism on Levothyroxine, TSH 3.56 04/01/21  Vitamin B12 deficiency anemia  on B12, Iron 47 05/22/21, Hgb 10.3 06/06/21,  Vit B12 deficiency, takes Vit B12, Vit B12 162 08/03/19>> Vit B12 869 08/24/20  Urinary frequency Urinary frequency and occasional incontinent of urine, on Myrbetriq.  GERD (gastroesophageal reflux disease)  stable, on Pantoprazole.    Depression, recurrent ([place])   Her mood is stable, on Venlafaxine, TSH 3.56 04/01/21  Generalized osteoarthritis of multiple sites Multiple sites, ambulates with walker, takes Tylenol.   Osteoporosis  takes Ca, Vit D. 04/29/21 Dr. Dossie Der dc Fosamax.  05/07/21 DEXA t score -1.273  Hyperlipidemia LDL goal <70  taking Atorvastatin.   Polymyalgia rheumatica (HCC) takes Prednisone '5mg'$  qd(failed GDR)  Sleep apnea uses CPAP     Family/ staff Communication: plan of care reviewed with the patient and charge nurse.   Labs/tests ordered:  none  Time spend 40 minutes.

## 2021-11-28 ENCOUNTER — Encounter: Payer: Self-pay | Admitting: Nurse Practitioner

## 2021-11-28 NOTE — Assessment & Plan Note (Signed)
Multiple sites, ambulates with walker, takes Tylenol.

## 2021-11-28 NOTE — Assessment & Plan Note (Signed)
taking Atorvastatin.  

## 2021-11-28 NOTE — Assessment & Plan Note (Signed)
on Isosorbide and Ranolazine, Plavix. LDL 70 08/24/20

## 2021-11-28 NOTE — Assessment & Plan Note (Signed)
takes Ca, Vit D. 04/29/21 Dr. Syed dc Fosamax.  05/07/21 DEXA t score -1.273 

## 2021-11-28 NOTE — Assessment & Plan Note (Signed)
Memory loss: MMSE 29/30 06/06/21, 07/02/21 CT head w/o CM, No acute abnormality no change from earlier this year. Moderate atrophy and moderate chronic microvascular ischemic change in the white matter.

## 2021-11-28 NOTE — Assessment & Plan Note (Signed)
Urinary frequency and occasional incontinent of urine, on Myrbetriq. 

## 2021-11-28 NOTE — Assessment & Plan Note (Signed)
uses CPAP 

## 2021-11-28 NOTE — Assessment & Plan Note (Signed)
stable, on Pantoprazole  

## 2021-11-28 NOTE — Assessment & Plan Note (Signed)
uses walker for ambulation.   

## 2021-11-28 NOTE — Assessment & Plan Note (Addendum)
on B12, Iron 47 05/22/21, Hgb 10.3 06/06/21,  Vit B12 deficiency, takes Vit B12, Vit B12 162 08/03/19>> Vit B12 869 08/24/20

## 2021-11-28 NOTE — Assessment & Plan Note (Signed)
elevated Bp, denied HA, chest pain/pressure or SOB, continue, continue Metoprolol, adding Losartan '25mg'$  qd, Bp daily.

## 2021-11-28 NOTE — Assessment & Plan Note (Signed)
Bun/creat 25/1.0 06/06/21

## 2021-11-28 NOTE — Assessment & Plan Note (Signed)
takes Prednisone 5mg qd(failed GDR) 

## 2021-11-28 NOTE — Assessment & Plan Note (Signed)
Her mood is stable, on Venlafaxine, TSH 3.56 04/01/21

## 2021-11-28 NOTE — Assessment & Plan Note (Signed)
on Levothyroxine, TSH 3.56 04/01/21

## 2021-12-04 ENCOUNTER — Non-Acute Institutional Stay: Payer: Medicare Other | Admitting: Nurse Practitioner

## 2021-12-04 ENCOUNTER — Encounter: Payer: Self-pay | Admitting: Nurse Practitioner

## 2021-12-04 DIAGNOSIS — M353 Polymyalgia rheumatica: Secondary | ICD-10-CM

## 2021-12-04 DIAGNOSIS — R413 Other amnesia: Secondary | ICD-10-CM | POA: Diagnosis not present

## 2021-12-04 DIAGNOSIS — I10 Essential (primary) hypertension: Secondary | ICD-10-CM

## 2021-12-04 DIAGNOSIS — R600 Localized edema: Secondary | ICD-10-CM

## 2021-12-04 DIAGNOSIS — R2681 Unsteadiness on feet: Secondary | ICD-10-CM

## 2021-12-04 DIAGNOSIS — M8000XA Age-related osteoporosis with current pathological fracture, unspecified site, initial encounter for fracture: Secondary | ICD-10-CM

## 2021-12-04 DIAGNOSIS — F339 Major depressive disorder, recurrent, unspecified: Secondary | ICD-10-CM

## 2021-12-04 DIAGNOSIS — I5032 Chronic diastolic (congestive) heart failure: Secondary | ICD-10-CM

## 2021-12-04 DIAGNOSIS — E785 Hyperlipidemia, unspecified: Secondary | ICD-10-CM

## 2021-12-04 DIAGNOSIS — G4733 Obstructive sleep apnea (adult) (pediatric): Secondary | ICD-10-CM

## 2021-12-04 DIAGNOSIS — E039 Hypothyroidism, unspecified: Secondary | ICD-10-CM

## 2021-12-04 DIAGNOSIS — R0789 Other chest pain: Secondary | ICD-10-CM

## 2021-12-04 DIAGNOSIS — D519 Vitamin B12 deficiency anemia, unspecified: Secondary | ICD-10-CM

## 2021-12-04 DIAGNOSIS — R350 Frequency of micturition: Secondary | ICD-10-CM

## 2021-12-04 DIAGNOSIS — N1831 Chronic kidney disease, stage 3a: Secondary | ICD-10-CM

## 2021-12-04 DIAGNOSIS — M159 Polyosteoarthritis, unspecified: Secondary | ICD-10-CM

## 2021-12-04 DIAGNOSIS — K219 Gastro-esophageal reflux disease without esophagitis: Secondary | ICD-10-CM

## 2021-12-04 DIAGNOSIS — I2510 Atherosclerotic heart disease of native coronary artery without angina pectoris: Secondary | ICD-10-CM

## 2021-12-04 NOTE — Progress Notes (Unsigned)
Location:   [facility] Room Number: 664 Place of Service:  ALF (13) Provider: Lennie Odor Sinclaire Artiga NP  Klea Nall X, NP  Patient Care Team: Rilyn Upshaw X, NP as PCP - General (Internal Medicine) Jettie Booze, MD as PCP - Cardiology (Cardiology) Wilford Corner, MD as Consulting Physician (Gastroenterology) Marlou Sa Tonna Corner, MD as Consulting Physician (Orthopedic Surgery) Marcial Pacas, MD as Consulting Physician (Neurology) Franchot Gallo, MD as Consulting Physician (Urology) Kary Kos, MD as Consulting Physician (Neurosurgery)  Extended Emergency Contact Information Primary Emergency Contact: Merlinda Frederick Address: [address]          [place] [address] [place] Phone: [phone] Mobile Phone: [phone] Relation: Spouse Secondary Emergency Contact: Yoe Mobile Phone: [phone] Relation: Son  Code Status: DNR Goals of care: Advanced Directive information    08/25/2021   11:05 AM  Advanced Directives  Does Patient Have a Medical Advance Directive? Yes  Type of Paramedic of Advance;Living will;Out of facility DNR (pink MOST or yellow form)  Does patient want to make changes to medical advance directive? No - Patient declined  Copy of [place] in Chart? Yes - validated most recent copy scanned in chart (See row information)  Pre-existing out of facility DNR order (yellow form or pink MOST form) Pink MOST/Yellow Form most recent copy in chart - Physician notified to receive inpatient order     Chief Complaint  Patient presents with  . Acute Visit    Left chest feels something, R chest feels empty, swelling feet, swelling fingers in am.     HPI:  Pt is a 81 y.o. female seen today for an acute visit for c/o feels something in the left chest, empty in the right chest, denied pain, pressures, palpitation, cough, wheezing, SOB, back pain, arm pain, diaphoresis, sense  of impending doom. She stated she slept well. Also c/o swelling feet, shoes feels tight, swelling fingers in am, ring feels tight in am, aches in fingers too.                 HTN, on Metoprolol, Losartan              Memory loss: MMSE 29/30 06/06/21, 07/02/21 CT head w/o CM, No acute abnormality no change from earlier this year. Moderate atrophy and moderate chronic microvascular ischemic change in the white matter.              Gait abnormality, uses walker for ambulation, w/c to go further.  CKD IIIa Bun/creat 25/1.0 06/06/21             CAD, on Isosorbide and Ranolazine, Plavix. LDL 70 08/24/20             Hypothyroidism, on Levothyroxine, TSH 3.56 04/01/21             Anemia, on B12, Iron 47 05/22/21, Hgb 10.3 06/06/21             Urinary frequency and occasional incontinent of urine, on Myrbetriq.             GERD, stable, on Pantoprazole.               The patient's husband is in SNF with terminal medical condition, on Venlafaxine, TSH 3.56 04/01/21             OA, takes Tylenol             OP, takes Ca, Vit D.  04/29/21 Dr. Dossie Der dc Fosamax.  05/07/21 DEXA t score -1.273             Vit B12 deficiency, takes Vit B12, Vit B12 162 08/03/19>> Vit B12 869 08/24/20             Hyperlipidemia, taking Atorvastatin.              PMR, takes Prednisone $RemoveBeforeDE'5mg'zjdgXhkRFzBDKFp$  qd(failed GDR)             OSA ,uses CPAP      Past Medical History:  Diagnosis Date  . Anemia   . Arthritis    "fingers, back, shoulders, hips" (04/10/2016)  . Chronic diastolic CHF (congestive heart failure) (HCC)    a. normal EF, LVEDP at [facility] in 6/17 33 >> Lasix started   . CKD (chronic kidney disease), stage III ([place])   . Coronary artery disease    a. s/p CABG 2000 (SVG/ free LIMA Y graft to the diagonal and distal LAD, SVG to the OM 1, and SVG to the PDA) . b. Cath 12/19/2014 90% dSVG to RCA s/p 2 overlapping DES. c. LHC 2016, 2017 no intervention except diuresis needed. d. Low risk nuc 03/2016. e. Cath 12/2017 patent LIMA-LAD, SVG-OM, SVG-PDA but  occluded SVG to diag.   . Depression    with anxious component  . GERD (gastroesophageal reflux disease)   . History of hiatal hernia   . Hyperlipidemia   . Hypothyroidism   . Obesity   . OSA on CPAP   . PMR (polymyalgia rheumatica) (HCC)   . Pneumonia    "2-3 times" (04/10/2016)  . Stable angina (HCC)    microvascular, improved with Ranexa  . Subclavian artery stenosis (HCC)    a. L by cath note in 2016.  Marland Kitchen TIA (transient ischemic attack)    Past Surgical History:  Procedure Laterality Date  . BREAST BIOPSY Right 1964  . CARDIAC CATHETERIZATION  08   patent grafts, no culprit lesions, EF 65%  . CARDIAC CATHETERIZATION N/A 12/19/2014   Procedure: Left Heart Cath and Cors/Grafts Angiography;  Surgeon: Jettie Booze, MD;  Location: [facility];  Service: Cardiovascular;  Laterality: N/A;  . CARDIAC CATHETERIZATION N/A 12/19/2014   Procedure: Coronary Stent Intervention;  Surgeon: Jettie Booze, MD;  Location: [facility];  Service: Cardiovascular;  Laterality: N/A;  . CARDIAC CATHETERIZATION N/A 01/01/2015   Procedure: Left Heart Cath and Cors/Grafts Angiography;  Surgeon: Jettie Booze, MD;  Location: [facility];  Service: Cardiovascular;  Laterality: N/A;  . CARDIAC CATHETERIZATION N/A 09/20/2015   Procedure: Left Heart Cath and Cors/Grafts Angiography;  Surgeon: Jettie Booze, MD;  Location: [facility];  Service: Cardiovascular;  Laterality: N/A;  . CATARACT EXTRACTION W/ INTRAOCULAR LENS  IMPLANT, BILATERAL Bilateral 2011  . COLONOSCOPY WITH PROPOFOL N/A 07/27/2016   Procedure: COLONOSCOPY WITH PROPOFOL;  Surgeon: Wilford Corner, MD;  Location: [facility];  Service: Endoscopy;  Laterality: N/A;  . CORONARY ARTERY BYPASS GRAFT  2000   ASCVD, multivessel, S./P.  . DILATION AND CURETTAGE OF UTERUS  1980s  . ESOPHAGOGASTRODUODENOSCOPY (EGD) WITH PROPOFOL N/A 07/27/2016   Procedure: ESOPHAGOGASTRODUODENOSCOPY (EGD) WITH PROPOFOL;   Surgeon: Wilford Corner, MD;  Location: [facility];  Service: Endoscopy;  Laterality: N/A;  . FRACTURE SURGERY    . LEFT HEART CATH AND CORS/GRAFTS ANGIOGRAPHY N/A 01/12/2018   Procedure: LEFT HEART CATH AND CORS/GRAFTS ANGIOGRAPHY;  Surgeon: Jettie Booze, MD;  Location: [facility];  Service: Cardiovascular;  Laterality: N/A;  . PATELLA FRACTURE SURGERY Left 1993    Allergies  Allergen Reactions  . Nsaids Other (See Comments)    Due to chronic kidney failure  . Butorphanol Other (See Comments)    agitation Constipation Produced a lot of urine, made patient feel crazy  . Sulfa Antibiotics Rash  . Bisoprolol Fumarate Other (See Comments)    Doesn't remember  . Butorphanol Tartrate Other (See Comments)    Produced a lot of urine, made patient feel crazy  . Codeine Nausea And Vomiting  . Demerol [Meperidine] Nausea And Vomiting  . Imipramine     Sweating, facial dysfunction   . Meperidine And Related Nausea And Vomiting  . Statins     MYALGIAS  . Tequin [Gatifloxacin] Other (See Comments)    Caused hypoglycemia Low blood sugar  . Brilinta [Ticagrelor] Rash    CAUSES PETECHIAE, PURPURA  . Pseudoephedrine Hcl Palpitations  . Septra [Sulfamethoxazole-Trimethoprim] Rash  . Sulfamethoxazole-Trimethoprim Rash    Allergies as of 12/04/2021       Reactions   Nsaids Other (See Comments)   Due to chronic kidney failure   Butorphanol Other (See Comments)   agitation Constipation Produced a lot of urine, made patient feel crazy   Sulfa Antibiotics Rash   Bisoprolol Fumarate Other (See Comments)   Doesn't remember   Butorphanol Tartrate Other (See Comments)   Produced a lot of urine, made patient feel crazy   Codeine Nausea And Vomiting   Demerol [meperidine] Nausea And Vomiting   Imipramine    Sweating, facial dysfunction    Meperidine And Related Nausea And Vomiting   Statins    MYALGIAS   Tequin [gatifloxacin] Other (See Comments)   Caused  hypoglycemia Low blood sugar   Brilinta [ticagrelor] Rash   CAUSES PETECHIAE, PURPURA   Pseudoephedrine Hcl Palpitations   Septra [sulfamethoxazole-trimethoprim] Rash   Sulfamethoxazole-trimethoprim Rash        Medication List        Accurate as of December 04, 2021 11:59 PM. If you have any questions, ask your nurse or doctor.          acetaminophen 500 MG tablet Commonly known as: TYLENOL Take 1,000 mg by mouth 2 (two) times daily as needed.   alum & mag hydroxide-simeth 400-400-40 MG/5ML suspension Commonly known as: MAALOX PLUS Take 10 mLs by mouth as needed for indigestion.   atorvastatin 10 MG tablet Commonly known as: LIPITOR TAKE 1 TABLET ONCE DAILY.   Biofreeze 10 % Liqd Generic drug: Menthol (Topical Analgesic) Apply topically as needed.   calcium carbonate 600 MG Tabs tablet Commonly known as: OS-CAL Take 600 mg by mouth 2 (two) times daily with a meal.   clopidogrel 75 MG tablet Commonly known as: PLAVIX TAKE 1 TABLET ONCE DAILY.   COENZYME Q-10 PO Take 10 mg by mouth at bedtime.   cyanocobalamin 1000 MCG tablet Take 1,000 mcg by mouth daily.   fluticasone 50 MCG/ACT nasal spray Commonly known as: FLONASE Place 2 sprays into both nostrils daily as needed for allergies.   isosorbide mononitrate 60 MG 24 hr tablet Commonly known as: IMDUR Take 2 tablets (120 mg total) by mouth daily.   levocetirizine 5 MG tablet Commonly known as: XYZAL TAKE 1 TABLET ONCE DAILY IN THE EVENING.   levothyroxine 75 MCG tablet Commonly known as: SYNTHROID TAKE 1 TABLET ONCE DAILY ON EMPTY STOMACH.   melatonin 1 MG Tabs tablet Take by mouth at bedtime.  memantine tablet pack Commonly known as: NAMENDA TITRATION PACK Take 5-10 mg by mouth 2 (two) times daily.   methocarbamol 500 MG tablet Commonly known as: ROBAXIN Take 500 mg by mouth every 6 (six) hours as needed for muscle spasms (Back Pain).   metoprolol succinate 25 MG 24 hr tablet Commonly  known as: TOPROL-XL Take 25 mg by mouth daily. Take with or immediately following a meal.   Myrbetriq 50 MG Tb24 tablet Generic drug: mirabegron ER TAKE 1 TABLET BY MOUTH DAILY.   nitroGLYCERIN 0.4 MG SL tablet Commonly known as: NITROSTAT Place 0.4 mg under the tongue every 5 (five) minutes as needed for chest pain. X 3 doses   nystatin powder Commonly known as: MYCOSTATIN/NYSTOP Apply 1 application topically. apply underneath abd/breast folds As Needed   pantoprazole 40 MG tablet Commonly known as: PROTONIX Take 1 tablet (40 mg total) by mouth daily.   predniSONE 5 MG tablet Commonly known as: DELTASONE Take 5 mg by mouth daily.   PSYLLIUM HUSK PO Take 0.4 g by mouth daily. May keep at bedside   ranolazine 1000 MG SR tablet Commonly known as: RANEXA TAKE 1 TABLET BY MOUTH TWICE DAILY.   Salonpas 3.03-28-08 % Ptch Generic drug: Camphor-Menthol-Methyl Sal Apply 1 patch topically 2 (two) times daily.   tiZANidine 2 MG tablet Commonly known as: ZANAFLEX Take 1 tablet (2 mg total) by mouth every 8 (eight) hours as needed for muscle spasms.   venlafaxine 75 MG tablet Commonly known as: EFFEXOR Take 150 mg by mouth. 2 caps= 150 mg; oral  Once A Day   Vitamin D 50 MCG (2000 UT) tablet Take 2,000 Units by mouth daily.   zinc oxide 20 % ointment Apply 1 application topically as needed for irritation.        Review of Systems  Constitutional:  Positive for activity change and fatigue. Negative for appetite change and fever.  HENT:  Positive for hearing loss. Negative for congestion and voice change.   Eyes:  Negative for visual disturbance.  Respiratory:  Positive for shortness of breath. Negative for cough and wheezing.        DOE is chronic, state feel empty right chest, something in the left chest.   Cardiovascular:  Positive for leg swelling.  Gastrointestinal:  Negative for abdominal pain and constipation.  Genitourinary:  Negative for dysuria, frequency and  urgency.       1-2x/night bathroom trips.   Musculoskeletal:  Positive for arthralgias, gait problem and myalgias.  Skin:  Negative for color change.  Neurological:  Negative for speech difficulty, weakness and headaches.       Memory lapses.   Psychiatric/Behavioral:  Positive for dysphoric mood and sleep disturbance. Negative for hallucinations. The patient is nervous/anxious.        Sometimes not sleeping well, flat affect today, not usual herself.     Immunization History  Administered Date(s) Administered  . HPV Bivalent 11/24/2013, 01/17/2016  . Influenza Whole 12/23/2017  . Influenza, High Dose Seasonal PF 12/24/2018, 12/24/2018  . Influenza-Unspecified 01/10/2013, 12/27/2014, 12/30/2016, 01/02/2020, 01/09/2021  . Moderna SARS-COV2 Booster Vaccination 08/08/2021  . Moderna Sars-Covid-2 Vaccination 03/25/2019, 04/22/2019, 01/30/2020, 08/20/2020, 12/10/2020  . Pneumococcal Conjugate-13 04/21/2013  . Pneumococcal Polysaccharide-23 02/27/1993, 03/23/2009  . Tdap 04/20/2012  . Zoster Recombinat (Shingrix) 03/29/2021, 06/09/2021  . Zoster, Live 11/10/2010   Pertinent  Health Maintenance Due  Topic Date Due  . INFLUENZA VACCINE  10/21/2021  . DEXA SCAN  Completed      10/14/2018  9:00 AM 10/14/2018    8:00 PM 10/15/2018    8:00 AM 02/10/2019    9:44 AM 03/27/2021    9:56 PM  Fall Risk  Falls in the past year?    0   Patient Fall Risk Level High fall risk High fall risk Moderate fall risk  Moderate fall risk   Functional Status Survey:    Vitals:   12/04/21 1315  BP: 132/70  Pulse: 79  Resp: 18  Temp: 97.8 F (36.6 C)  SpO2: 95%  Weight: 195 lb (88.5 kg)   Body mass index is 40.76 kg/m. Physical Exam Vitals and nursing note reviewed.  Constitutional:      Comments: fatigue  HENT:     Head: Normocephalic and atraumatic.     Mouth/Throat:     Mouth: Mucous membranes are moist.  Eyes:     Extraocular Movements: Extraocular movements intact.      Conjunctiva/sclera: Conjunctivae normal.     Pupils: Pupils are equal, round, and reactive to light.  Cardiovascular:     Rate and Rhythm: Normal rate and regular rhythm.     Heart sounds:     No gallop.  Pulmonary:     Breath sounds: No rhonchi or rales.  Abdominal:     General: Bowel sounds are normal.     Palpations: Abdomen is soft.     Tenderness: There is no abdominal tenderness.  Musculoskeletal:     Cervical back: Normal range of motion and neck supple.     Right lower leg: Edema present.     Left lower leg: Edema present.     Comments: Trace edema RLE, fingers.   Skin:    General: Skin is warm and dry.  Neurological:     General: No focal deficit present.     Mental Status: She is alert and oriented to person, place, and time. Mental status is at baseline.     Motor: No weakness.     Coordination: Coordination normal.     Gait: Gait abnormal.  Psychiatric:        Mood and Affect: Mood normal.        Behavior: Behavior normal.        Thought Content: Thought content normal.        Judgment: Judgment normal.    Labs reviewed: Recent Labs    04/01/21 0000 06/06/21 0000  NA 141 138  K 4.8 4.5  CL 106 107  CO2 27* 23*  BUN 24* 25*  CREATININE 1.2* 1.0  CALCIUM 8.6* 9.0   Recent Labs    04/01/21 0000 06/06/21 0000  AST 10* 12*  ALT 6* 11  ALKPHOS 39 52  ALBUMIN 3.4* 3.7   Recent Labs    04/01/21 0000 05/23/21 0000 06/06/21 0000  WBC 8.8 9.3 10.8  NEUTROABS 5,315.00  --  7,711.00  HGB 9.8* 10.1* 10.3*  HCT 30* 31* 32*  PLT 238 263 269   Lab Results  Component Value Date   TSH 3.56 04/01/2021   Lab Results  Component Value Date   HGBA1C 5.1 02/14/2019   Lab Results  Component Value Date   CHOL 151 08/24/2020   HDL 57 08/24/2020   LDLCALC 70 08/24/2020   TRIG 162 (A) 08/24/2020   CHOLHDL 2.1 06/07/2018    Significant Diagnostic Results in last 30 days:  No results found.  Assessment/Plan: Discomfort in chest  State feels something  in the left chest, empty in the right chest, denied pain, pressures,  palpitation, cough, wheezing, SOB, back pain, arm pain, diaphoresis, sense of impending doom. She stated she slept well. Will obtain CBC/diff, CMP/eGFR, CRP, ESR, TSH, EKG, CXR ap/lateral, Hx of CAD 12/10/21 Echo LVEF 65%  Bilateral lower extremity edema State feels something in the left chest, empty in the right chest, denied pain, pressures, palpitation, cough, wheezing, SOB, back pain, arm pain, diaphoresis, sense of impending doom. She stated she slept well. Will try Furosemide $RemoveBefore'40mg'ahiYlGDwzrLvs$  stat x1, observe weight  Essential hypertension Blood pressure is controlled,  on Metoprolol, Losartan   Memory loss  MMSE 29/30 06/06/21, 07/02/21 CT head w/o CM, No acute abnormality no change from earlier this year. Moderate atrophy and moderate chronic microvascular ischemic change in the white matter.  Gait instability uses walker for ambulation, w/c to go further.   CKD (chronic kidney disease) stage 3, GFR 30-59 ml/min (HCC) Bun/creat 25/1.0 06/06/21  CAD (coronary artery disease) on Isosorbide and Ranolazine, Plavix. LDL 70 08/24/20  Hypothyroidism on Levothyroxine, TSH 3.56 04/01/21  Vitamin B12 deficiency anemia on B12, Iron 47 05/22/21, Hgb 10.3 06/06/21, takes Vit B12, Vit B12 162 08/03/19>> Vit B12 869 08/24/20  Urinary frequency Urinary frequency and occasional incontinent of urine, on Myrbetriq.  GERD (gastroesophageal reflux disease) stable, on Pantoprazole.    Depression, recurrent ([place])  The patient's husband is in SNF with terminal medical condition, on Venlafaxine-may increase to $RemoveBef'225mg'fioNBxWDel$  if workup unremarkable, TSH 3.56 04/01/21  Generalized osteoarthritis of multiple sites  takes Tylenol  Osteoporosis takes Ca, Vit D. 04/29/21 Dr. Dossie Der dc Fosamax.  05/07/21 DEXA t score -1.273  Hyperlipidemia LDL goal <70 taking Atorvastatin.   Polymyalgia rheumatica (HCC)  takes Prednisone $RemoveBeforeDE'5mg'LSioggvqBZugCxx$  qd(failed GDR)-may increase if  indicated.   Sleep apnea uses CPAP  (HFpEF) heart failure with preserved ejection fraction ([place]) 12/04/21 CXR mild CHF, EF 55-60% 06/2018, mild edema BLE, DOE chronic. Will adding Furosemide $RemoveBeforeDEI'20mg'cqCxzDDPVkhDBaOF$ , Kcl 71meq qd,  12/04/21 Na 140, K 4.4, Bun 29, creat 1.37, TSH 3.03, ESR 17, wbc 12.2, Hgb 10.3, plt 268, neutrophil 71.7    Family/ staff Communication: plan of care reviewed with the patient and charge nurse.   Labs/tests ordered:  CBC/diff, CMP/eGFR, CRP, ESR, TSH, EKG, CXR ap/lateral  Time spend 40 minutes.

## 2021-12-04 NOTE — Assessment & Plan Note (Signed)
takes Prednisone '5mg'$  qd(failed GDR)-may increase if indicated.

## 2021-12-04 NOTE — Assessment & Plan Note (Signed)
on Levothyroxine, TSH 3.56 04/01/21

## 2021-12-04 NOTE — Progress Notes (Unsigned)
Location:  [facility] Room Number: AL/911/A Place of Service:  ALF (13) Provider:  Cayce Paschal X, NP  Patient Care Team: Jaquann Guarisco X, NP as PCP - General (Internal Medicine) Jettie Booze, MD as PCP - Cardiology (Cardiology) Wilford Corner, MD as Consulting Physician (Gastroenterology) Marlou Sa Tonna Corner, MD as Consulting Physician (Orthopedic Surgery) Marcial Pacas, MD as Consulting Physician (Neurology) Franchot Gallo, MD as Consulting Physician (Urology) Kary Kos, MD as Consulting Physician (Neurosurgery)  Extended Emergency Contact Information Primary Emergency Contact: Merlinda Frederick Address: [address]          [place] [address] [place] Phone: [phone] Mobile Phone: [phone] Relation: Spouse Secondary Emergency Contact: Jenera Mobile Phone: [phone] Relation: Son  Code Status:  DNR Goals of care: Advanced Directive information    08/25/2021   11:05 AM  Advanced Directives  Does Patient Have a Medical Advance Directive? Yes  Type of Paramedic of [place];Living will;Out of facility DNR (pink MOST or yellow form)  Does patient want to make changes to medical advance directive? No - Patient declined  Copy of [place] in Chart? Yes - validated most recent copy scanned in chart (See row information)  Pre-existing out of facility DNR order (yellow form or pink MOST form) Pink MOST/Yellow Form most recent copy in chart - Physician notified to receive inpatient order     Chief Complaint  Patient presents with   Acute Visit    Patient is being seen for edema    HPI:  Pt is a 81 y.o. female seen today for an acute visit for    Past Medical History:  Diagnosis Date   Anemia    Arthritis    "fingers, back, shoulders, hips" (04/10/2016)   Chronic diastolic CHF (congestive heart failure) ([place])    a. normal EF, LVEDP at [facility] in 6/17 33  >> Lasix started    CKD (chronic kidney disease), stage III ([place])    Coronary artery disease    a. s/p CABG 2000 (SVG/ free LIMA Y graft to the diagonal and distal LAD, SVG to the OM 1, and SVG to the PDA) . b. Cath 12/19/2014 90% dSVG to RCA s/p 2 overlapping DES. c. LHC 2016, 2017 no intervention except diuresis needed. d. Low risk nuc 03/2016. e. Cath 12/2017 patent LIMA-LAD, SVG-OM, SVG-PDA but occluded SVG to diag.    Depression    with anxious component   GERD (gastroesophageal reflux disease)    History of hiatal hernia    Hyperlipidemia    Hypothyroidism    Obesity    OSA on CPAP    PMR (polymyalgia rheumatica) (HCC)    Pneumonia    "2-3 times" (04/10/2016)   Stable angina (HCC)    microvascular, improved with Ranexa   Subclavian artery stenosis ([place])    a. L by cath note in 2016.   TIA (transient ischemic attack)    Past Surgical History:  Procedure Laterality Date   BREAST BIOPSY Right 1964   CARDIAC CATHETERIZATION  08   patent grafts, no culprit lesions, EF 65%   CARDIAC CATHETERIZATION N/A 12/19/2014   Procedure: Left Heart Cath and Cors/Grafts Angiography;  Surgeon: Jettie Booze, MD;  Location: [facility];  Service: Cardiovascular;  Laterality: N/A;   CARDIAC CATHETERIZATION N/A 12/19/2014   Procedure: Coronary Stent Intervention;  Surgeon: Jettie Booze, MD;  Location: [facility];  Service: Cardiovascular;  Laterality:  N/A;   CARDIAC CATHETERIZATION N/A 01/01/2015   Procedure: Left Heart Cath and Cors/Grafts Angiography;  Surgeon: Jettie Booze, MD;  Location: [facility];  Service: Cardiovascular;  Laterality: N/A;   CARDIAC CATHETERIZATION N/A 09/20/2015   Procedure: Left Heart Cath and Cors/Grafts Angiography;  Surgeon: Jettie Booze, MD;  Location: [facility];  Service: Cardiovascular;  Laterality: N/A;   CATARACT EXTRACTION W/ INTRAOCULAR LENS  IMPLANT, BILATERAL Bilateral 2011   COLONOSCOPY WITH PROPOFOL N/A  07/27/2016   Procedure: COLONOSCOPY WITH PROPOFOL;  Surgeon: Wilford Corner, MD;  Location: [facility];  Service: Endoscopy;  Laterality: N/A;   CORONARY ARTERY BYPASS GRAFT  2000   ASCVD, multivessel, S./P.   DILATION AND CURETTAGE OF UTERUS  1980s   ESOPHAGOGASTRODUODENOSCOPY (EGD) WITH PROPOFOL N/A 07/27/2016   Procedure: ESOPHAGOGASTRODUODENOSCOPY (EGD) WITH PROPOFOL;  Surgeon: Wilford Corner, MD;  Location: [facility];  Service: Endoscopy;  Laterality: N/A;   FRACTURE SURGERY     LEFT HEART CATH AND CORS/GRAFTS ANGIOGRAPHY N/A 01/12/2018   Procedure: LEFT HEART CATH AND CORS/GRAFTS ANGIOGRAPHY;  Surgeon: Jettie Booze, MD;  Location: [facility];  Service: Cardiovascular;  Laterality: N/A;   PATELLA FRACTURE SURGERY Left 1993    Allergies  Allergen Reactions   Nsaids Other (See Comments)    Due to chronic kidney failure   Butorphanol Other (See Comments)    agitation Constipation Produced a lot of urine, made patient feel crazy   Sulfa Antibiotics Rash   Bisoprolol Fumarate Other (See Comments)    Doesn't remember   Butorphanol Tartrate Other (See Comments)    Produced a lot of urine, made patient feel crazy   Codeine Nausea And Vomiting   Demerol [Meperidine] Nausea And Vomiting   Imipramine     Sweating, facial dysfunction    Meperidine And Related Nausea And Vomiting   Statins     MYALGIAS   Tequin [Gatifloxacin] Other (See Comments)    Caused hypoglycemia Low blood sugar   Brilinta [Ticagrelor] Rash    CAUSES PETECHIAE, PURPURA   Pseudoephedrine Hcl Palpitations   Septra [Sulfamethoxazole-Trimethoprim] Rash   Sulfamethoxazole-Trimethoprim Rash    Outpatient Encounter Medications as of 12/04/2021  Medication Sig   acetaminophen (TYLENOL) 500 MG tablet Take 1,000 mg by mouth 2 (two) times daily as needed.   alum & mag hydroxide-simeth (MAALOX PLUS) 400-400-40 MG/5ML suspension Take 10 mLs by mouth as needed for indigestion.   atorvastatin  (LIPITOR) 10 MG tablet TAKE 1 TABLET ONCE DAILY.   calcium carbonate (OS-CAL) 600 MG TABS tablet Take 600 mg by mouth 2 (two) times daily with a meal.   Camphor-Menthol-Methyl Sal (SALONPAS) 3.03-28-08 % PTCH Apply 1 patch topically 2 (two) times daily.   Cholecalciferol (VITAMIN D) 50 MCG (2000 UT) tablet Take 2,000 Units by mouth daily.   clopidogrel (PLAVIX) 75 MG tablet TAKE 1 TABLET ONCE DAILY.   COENZYME Q-10 PO Take 10 mg by mouth at bedtime.   cyanocobalamin 1000 MCG tablet Take 1,000 mcg by mouth daily.   fluticasone (FLONASE) 50 MCG/ACT nasal spray Place 2 sprays into both nostrils daily as needed for allergies.    isosorbide mononitrate (IMDUR) 60 MG 24 hr tablet Take 2 tablets (120 mg total) by mouth daily.   levocetirizine (XYZAL) 5 MG tablet TAKE 1 TABLET ONCE DAILY IN THE EVENING.   levothyroxine (SYNTHROID) 75 MCG tablet TAKE 1 TABLET ONCE DAILY ON EMPTY STOMACH.   Melatonin 1 MG TABS Take by mouth at bedtime.  memantine (NAMENDA TITRATION PACK) tablet pack Take 5-10 mg by mouth 2 (two) times daily.   Menthol, Topical Analgesic, (BIOFREEZE) 10 % LIQD Apply topically as needed.   methocarbamol (ROBAXIN) 500 MG tablet Take 500 mg by mouth every 6 (six) hours as needed for muscle spasms (Back Pain).   metoprolol succinate (TOPROL-XL) 25 MG 24 hr tablet Take 25 mg by mouth daily. Take with or immediately following a meal.   MYRBETRIQ 50 MG TB24 tablet TAKE 1 TABLET BY MOUTH DAILY.   nitroGLYCERIN (NITROSTAT) 0.4 MG SL tablet Place 0.4 mg under the tongue every 5 (five) minutes as needed for chest pain. X 3 doses   nystatin (MYCOSTATIN/NYSTOP) powder Apply 1 application topically. apply underneath abd/breast folds As Needed   pantoprazole (PROTONIX) 40 MG tablet Take 1 tablet (40 mg total) by mouth daily.   predniSONE (DELTASONE) 5 MG tablet Take 5 mg by mouth daily.   PSYLLIUM HUSK PO Take 0.4 g by mouth daily. May keep at bedside   ranolazine (RANEXA) 1000 MG SR tablet TAKE 1  TABLET BY MOUTH TWICE DAILY.   tiZANidine (ZANAFLEX) 2 MG tablet Take 1 tablet (2 mg total) by mouth every 8 (eight) hours as needed for muscle spasms.   venlafaxine (EFFEXOR) 75 MG tablet Take 150 mg by mouth. 2 caps= 150 mg; oral  Once A Day   zinc oxide 20 % ointment Apply 1 application topically as needed for irritation.   No facility-administered encounter medications on file as of 12/04/2021.    Review of Systems  Immunization History  Administered Date(s) Administered   HPV Bivalent 11/24/2013, 01/17/2016   Influenza Whole 12/23/2017   Influenza, High Dose Seasonal PF 12/24/2018, 12/24/2018   Influenza-Unspecified 01/10/2013, 12/27/2014, 12/30/2016, 01/02/2020, 01/09/2021   Moderna SARS-COV2 Booster Vaccination 08/08/2021   Moderna Sars-Covid-2 Vaccination 03/25/2019, 04/22/2019, 01/30/2020, 08/20/2020, 12/10/2020   Pneumococcal Conjugate-13 04/21/2013   Pneumococcal Polysaccharide-23 02/27/1993, 03/23/2009   Tdap 04/20/2012   Zoster Recombinat (Shingrix) 03/29/2021, 06/09/2021   Zoster, Live 11/10/2010   Pertinent  Health Maintenance Due  Topic Date Due   INFLUENZA VACCINE  10/21/2021   DEXA SCAN  Completed      10/14/2018    9:00 AM 10/14/2018    8:00 PM 10/15/2018    8:00 AM 02/10/2019    9:44 AM 03/27/2021    9:56 PM  Fall Risk  Falls in the past year?    0   Patient Fall Risk Level High fall risk High fall risk Moderate fall risk  Moderate fall risk   Functional Status Survey:    Vitals:   12/04/21 1015  BP: 132/70  Pulse: 79  Resp: 18  Temp: 97.8 F (36.6 C)  SpO2: 95%  Weight: 195 lb (88.5 kg)  Height: '4\' 10"'$  (1.473 m)   Body mass index is 40.76 kg/m. Physical Exam  Labs reviewed: Recent Labs    04/01/21 0000 06/06/21 0000  NA 141 138  K 4.8 4.5  CL 106 107  CO2 27* 23*  BUN 24* 25*  CREATININE 1.2* 1.0  CALCIUM 8.6* 9.0   Recent Labs    04/01/21 0000 06/06/21 0000  AST 10* 12*  ALT 6* 11  ALKPHOS 39 52  ALBUMIN 3.4* 3.7    Recent Labs    04/01/21 0000 05/23/21 0000 06/06/21 0000  WBC 8.8 9.3 10.8  NEUTROABS 5,315.00  --  7,711.00  HGB 9.8* 10.1* 10.3*  HCT 30* 31* 32*  PLT 238 263 269   Lab Results  Component Value Date   TSH 3.56 04/01/2021   Lab Results  Component Value Date   HGBA1C 5.1 02/14/2019   Lab Results  Component Value Date   CHOL 151 08/24/2020   HDL 57 08/24/2020   LDLCALC 70 08/24/2020   TRIG 162 (A) 08/24/2020   CHOLHDL 2.1 06/07/2018    Significant Diagnostic Results in last 30 days:  No results found.  Assessment/Plan There are no diagnoses linked to this encounter.   Family/ staff Communication: ***  Labs/tests ordered:  ***

## 2021-12-04 NOTE — Assessment & Plan Note (Signed)
uses CPAP 

## 2021-12-04 NOTE — Assessment & Plan Note (Signed)
Blood pressure is controlled,  on Metoprolol, Losartan

## 2021-12-04 NOTE — Assessment & Plan Note (Signed)
State feels something in the left chest, empty in the right chest, denied pain, pressures, palpitation, cough, wheezing, SOB, back pain, arm pain, diaphoresis, sense of impending doom. She stated she slept well. Will try Furosemide '40mg'$  stat x1, observe weight

## 2021-12-04 NOTE — Assessment & Plan Note (Addendum)
State feels something in the left chest, empty in the right chest, denied pain, pressures, palpitation, cough, wheezing, SOB, back pain, arm pain, diaphoresis, sense of impending doom. She stated she slept well. Will obtain CBC/diff, CMP/eGFR, CRP, ESR, TSH, EKG, CXR ap/lateral, Hx of CAD 12/10/21 Echo LVEF 65%

## 2021-12-04 NOTE — Assessment & Plan Note (Addendum)
on B12, Iron 47 05/22/21, Hgb 10.3 06/06/21, takes Vit B12, Vit B12 162 08/03/19>> Vit B12 869 08/24/20

## 2021-12-04 NOTE — Assessment & Plan Note (Signed)
stable, on Pantoprazole  

## 2021-12-04 NOTE — Assessment & Plan Note (Signed)
takes Ca, Vit D. 04/29/21 Dr. Syed dc Fosamax.  05/07/21 DEXA t score -1.273 

## 2021-12-04 NOTE — Assessment & Plan Note (Signed)
The patient's husband is in SNF with terminal medical condition, on Venlafaxine-may increase to '225mg'$  if workup unremarkable, TSH 3.56 04/01/21

## 2021-12-04 NOTE — Assessment & Plan Note (Signed)
uses walker for ambulation, w/c to go further.  

## 2021-12-04 NOTE — Assessment & Plan Note (Signed)
12/04/21 CXR mild CHF, EF 55-60% 06/2018, mild edema BLE, DOE chronic. Will adding Furosemide $RemoveBeforeDEI'20mg'LcDpSnWPvwnwgPWc$ , Kcl 49meq qd,  12/04/21 Na 140, K 4.4, Bun 29, creat 1.37, TSH 3.03, ESR 17, wbc 12.2, Hgb 10.3, plt 268, neutrophil 71.7

## 2021-12-04 NOTE — Assessment & Plan Note (Signed)
taking Atorvastatin.  

## 2021-12-04 NOTE — Assessment & Plan Note (Signed)
Urinary frequency and occasional incontinent of urine, on Myrbetriq. 

## 2021-12-04 NOTE — Assessment & Plan Note (Signed)
MMSE 29/30 06/06/21, 07/02/21 CT head w/o CM, No acute abnormality no change from earlier this year. Moderate atrophy and moderate chronic microvascular ischemic change in the white matter.

## 2021-12-04 NOTE — Assessment & Plan Note (Signed)
on Isosorbide and Ranolazine, Plavix. LDL 70 08/24/20

## 2021-12-04 NOTE — Assessment & Plan Note (Signed)
takes Tylenol    

## 2021-12-04 NOTE — Assessment & Plan Note (Signed)
Bun/creat 25/1.0 06/06/21

## 2021-12-05 ENCOUNTER — Encounter: Payer: Self-pay | Admitting: Nurse Practitioner

## 2021-12-05 LAB — BASIC METABOLIC PANEL
BUN: 29 — AB (ref 4–21)
CO2: 24 — AB (ref 13–22)
Chloride: 109 — AB (ref 99–108)
Creatinine: 1.4 — AB (ref 0.5–1.1)
Glucose: 122
Potassium: 4.4 mEq/L (ref 3.5–5.1)
Sodium: 140 (ref 137–147)

## 2021-12-05 LAB — HEPATIC FUNCTION PANEL
ALT: 7 U/L (ref 7–35)
AST: 11 — AB (ref 13–35)
Alkaline Phosphatase: 49 (ref 25–125)
Bilirubin, Total: 0.4

## 2021-12-05 LAB — CBC: RBC: 3.27 — AB (ref 3.87–5.11)

## 2021-12-05 LAB — COMPREHENSIVE METABOLIC PANEL
Albumin: 3.9 (ref 3.5–5.0)
Calcium: 8.7 (ref 8.7–10.7)
Globulin: 2.2
eGFR: 39

## 2021-12-05 LAB — CBC AND DIFFERENTIAL
HCT: 32 — AB (ref 36–46)
Hemoglobin: 10.3 — AB (ref 12.0–16.0)
Neutrophils Absolute: 8747
Platelets: 268 10*3/uL (ref 150–400)
WBC: 12.2

## 2021-12-30 ENCOUNTER — Non-Acute Institutional Stay: Payer: Medicare Other | Admitting: Family Medicine

## 2021-12-30 DIAGNOSIS — I2510 Atherosclerotic heart disease of native coronary artery without angina pectoris: Secondary | ICD-10-CM | POA: Diagnosis not present

## 2021-12-30 DIAGNOSIS — I5032 Chronic diastolic (congestive) heart failure: Secondary | ICD-10-CM

## 2021-12-30 DIAGNOSIS — R413 Other amnesia: Secondary | ICD-10-CM

## 2021-12-30 DIAGNOSIS — M353 Polymyalgia rheumatica: Secondary | ICD-10-CM

## 2021-12-30 DIAGNOSIS — R2681 Unsteadiness on feet: Secondary | ICD-10-CM

## 2021-12-30 DIAGNOSIS — I10 Essential (primary) hypertension: Secondary | ICD-10-CM

## 2021-12-30 NOTE — Progress Notes (Signed)
Provider:  Alain Honey, MD Location:      Place of Service:     PCP: Mast, Man X, NP Patient Care Team: Mast, Man X, NP as PCP - General (Internal Medicine) Erin Booze, MD as PCP - Cardiology (Cardiology) Wilford Corner, MD as Consulting Physician (Gastroenterology) Marlou Sa, Tonna Corner, MD as Consulting Physician (Orthopedic Surgery) Marcial Pacas, MD as Consulting Physician (Neurology) Franchot Gallo, MD as Consulting Physician (Urology) Kary Kos, MD as Consulting Physician (Neurosurgery)  Extended Emergency Contact Information Primary Emergency Contact: Merlinda Frederick Address: [address]          [place] [address] [place] Phone: [phone] Mobile Phone: [phone] Relation: Spouse Secondary Emergency Contact: [place] Mobile Phone: [phone] Relation: Son  Code Status:  Goals of Care: Advanced Directive information    08/25/2021   11:05 AM  Advanced Directives  Does Patient Have a Medical Advance Directive? Yes  Type of Paramedic of [place];Living will;Out of facility DNR (pink MOST or yellow form)  Does patient want to make changes to medical advance directive? No - Patient declined  Copy of [place] in Chart? Yes - validated most recent copy scanned in chart (See row information)  Pre-existing out of facility DNR order (yellow form or pink MOST form) Pink MOST/Yellow Form most recent copy in chart - Physician notified to receive inpatient order      No chief complaint on file.   HPI: Patient is a 81 y.o. female seen today for medical management of chronic problems including polymyalgia rheumatica, hypertension, memory loss, gait and stability, coronary artery disease, and depression.  Her visit took place in her husband's room where she was visiting.  He is a hospice patient. She denies any current problems or symptoms.  We spent more time talking about  her husband and his agitation.  She has some memory loss but most recent MM as he was 6 months ago and scored 29/30.  She did repeat herself and talk to me about her husband's illness. She has a history of congestive heart failure and was recently started on Lasix per her history.  She denies any PND but does prop the head of her bed up to help with breathing.  She also uses CPAP for OSA at night. She denies any recent falls but does use her walker and/or wheelchair.  Past Medical History:  Diagnosis Date   Anemia    Arthritis    "fingers, back, shoulders, hips" (04/10/2016)   Chronic diastolic CHF (congestive heart failure) (HCC)    a. normal EF, LVEDP at [facility] in 6/17 33 >> Lasix started    CKD (chronic kidney disease), stage III (HCC)    Coronary artery disease    a. s/p CABG 2000 (SVG/ free LIMA Y graft to the diagonal and distal LAD, SVG to the OM 1, and SVG to the PDA) . b. Cath 12/19/2014 90% dSVG to RCA s/p 2 overlapping DES. c. LHC 2016, 2017 no intervention except diuresis needed. d. Low risk nuc 03/2016. e. Cath 12/2017 patent LIMA-LAD, SVG-OM, SVG-PDA but occluded SVG to diag.    Depression    with anxious component   GERD (gastroesophageal reflux disease)    History of hiatal hernia    Hyperlipidemia    Hypothyroidism    Obesity    OSA on CPAP    PMR (polymyalgia rheumatica) (HCC)    Pneumonia    "2-3 times" (04/10/2016)   Stable angina (  [place])    microvascular, improved with Ranexa   Subclavian artery stenosis ([place])    a. L by cath note in 2016.   TIA (transient ischemic attack)    Past Surgical History:  Procedure Laterality Date   BREAST BIOPSY Right 1964   CARDIAC CATHETERIZATION  08   patent grafts, no culprit lesions, EF 65%   CARDIAC CATHETERIZATION N/A 12/19/2014   Procedure: Left Heart Cath and Cors/Grafts Angiography;  Surgeon: Erin Booze, MD;  Location: [place] [facility];  Service: Cardiovascular;  Laterality: N/A;   CARDIAC CATHETERIZATION N/A 12/19/2014    Procedure: Coronary Stent Intervention;  Surgeon: Erin Booze, MD;  Location: [place] [facility];  Service: Cardiovascular;  Laterality: N/A;   CARDIAC CATHETERIZATION N/A 01/01/2015   Procedure: Left Heart Cath and Cors/Grafts Angiography;  Surgeon: Erin Booze, MD;  Location: [place] [facility];  Service: Cardiovascular;  Laterality: N/A;   CARDIAC CATHETERIZATION N/A 09/20/2015   Procedure: Left Heart Cath and Cors/Grafts Angiography;  Surgeon: Erin Booze, MD;  Location: [place] [facility];  Service: Cardiovascular;  Laterality: N/A;   CATARACT EXTRACTION W/ INTRAOCULAR LENS  IMPLANT, BILATERAL Bilateral 2011   COLONOSCOPY WITH PROPOFOL N/A 07/27/2016   Procedure: COLONOSCOPY WITH PROPOFOL;  Surgeon: Wilford Corner, MD;  Location: [place];  Service: Endoscopy;  Laterality: N/A;   CORONARY ARTERY BYPASS GRAFT  2000   ASCVD, multivessel, S./P.   DILATION AND CURETTAGE OF UTERUS  1980s   ESOPHAGOGASTRODUODENOSCOPY (EGD) WITH PROPOFOL N/A 07/27/2016   Procedure: ESOPHAGOGASTRODUODENOSCOPY (EGD) WITH PROPOFOL;  Surgeon: Wilford Corner, MD;  Location: [place];  Service: Endoscopy;  Laterality: N/A;   FRACTURE SURGERY     LEFT HEART CATH AND CORS/GRAFTS ANGIOGRAPHY N/A 01/12/2018   Procedure: LEFT HEART CATH AND CORS/GRAFTS ANGIOGRAPHY;  Surgeon: Erin Booze, MD;  Location: [place] [facility];  Service: Cardiovascular;  Laterality: N/A;   PATELLA FRACTURE SURGERY Left 1993    reports that she quit smoking about 52 years ago. Her smoking use included cigarettes. She has a 3.50 pack-year smoking history. She has never used smokeless tobacco. She reports that she does not drink alcohol and does not use drugs. Social History   Socioeconomic History   Marital status: Married    Spouse name: Not on file   Number of children: 2   Years of education: College   Highest education level: Not on file  Occupational History   Occupation: Retired Education officer, museum  Tobacco Use   Smoking status: Former    Packs/day: 0.50    Years: 7.00    Total pack years: 3.50    Types: Cigarettes    Quit date: 08/21/1969    Years since quitting: 52.3   Smokeless tobacco: Never  Vaping Use   Vaping Use: Never used  Substance and Sexual Activity   Alcohol use: No    Alcohol/week: 0.0 standard drinks of alcohol    Comment: rare   Drug use: No   Sexual activity: Never  Other Topics Concern   Not on file  Social History Narrative   Social History     Social History Narrative       Lives in [place] with husband.   Diet: Regular   Do you drink/eat things with caffeine? 1 cup caffeine per day.  Not much coffee   Marital status: Married  What year were you married? 1964   Do you live in a house, apartment, assisted living, condo, trailer, etc)? Here at [facility]   Is it one or more stories?   How many persons live in your home? 2   Do you have any pets in your home? No   Current or past profession: Engineer, production (RN)   Do you exercise? I did                                                Type & how often: Swimming, Tai Chi, exercise classes   Do you have a living will? yes   Do you have a DNR Form? Yes   Do you have a POA/HPOA forms?             Social Determinants of Health   Financial Resource Strain: Low Risk  (12/07/2017)   Overall Financial Resource Strain (CARDIA)    Difficulty of Paying Living Expenses: Not hard at all  Food Insecurity: No Food Insecurity (12/07/2017)   Hunger Vital Sign    Worried About Running Out of Food in the Last Year: Never true    Ran Out of Food in the Last Year: Never true  Transportation Needs: No Transportation Needs (12/07/2017)   PRAPARE - Hydrologist (Medical): No    Lack of Transportation (Non-Medical): No  Physical Activity: Inactive (12/07/2017)   Exercise Vital Sign    Days of Exercise per Week: 0 days    Minutes of Exercise per Session: 0 min   Stress: Stress Concern Present (12/07/2017)   [place]    Feeling of Stress : To some extent  Social Connections: Somewhat Isolated (12/07/2017)   Social Connection and Isolation Panel [NHANES]    Frequency of Communication with Friends and Family: More than three times a week    Frequency of Social Gatherings with Friends and Family: More than three times a week    Attends Religious Services: Never    Marine scientist or Organizations: No    Attends Archivist Meetings: Never    Marital Status: Married  Human resources officer Violence: Not At Risk (12/07/2017)   Humiliation, Afraid, Rape, and Kick questionnaire    Fear of Current or Ex-Partner: No    Emotionally Abused: No    Physically Abused: No    Sexually Abused: No    Functional Status Survey:    Family History  Problem Relation Age of Onset   Heart disease Mother    Heart attack Mother    Heart disease Father    Heart attack Father    Diabetes Brother    Pulmonary embolism Brother    Stroke Paternal Grandmother    Hypertension Neg Hx     Health Maintenance  Topic Date Due   COVID-19 Vaccine (6 - Moderna series) 10/03/2021   INFLUENZA VACCINE  10/21/2021   TETANUS/TDAP  04/20/2022   Pneumonia Vaccine 93+ Years old  Completed   DEXA SCAN  Completed   Zoster Vaccines- Shingrix  Completed   HPV VACCINES  Aged Out    Allergies  Allergen Reactions   Nsaids Other (See Comments)    Due to chronic kidney failure   Butorphanol Other (See Comments)    agitation Constipation Produced a lot  of urine, made patient feel crazy   Sulfa Antibiotics Rash   Bisoprolol Fumarate Other (See Comments)    Doesn't remember   Butorphanol Tartrate Other (See Comments)    Produced a lot of urine, made patient feel crazy   Codeine Nausea And Vomiting   Demerol [Meperidine] Nausea And Vomiting   Imipramine     Sweating, facial dysfunction    Meperidine  And Related Nausea And Vomiting   Statins     MYALGIAS   Tequin [Gatifloxacin] Other (See Comments)    Caused hypoglycemia Low blood sugar   Brilinta [Ticagrelor] Rash    CAUSES PETECHIAE, PURPURA   Pseudoephedrine Hcl Palpitations   Septra [Sulfamethoxazole-Trimethoprim] Rash   Sulfamethoxazole-Trimethoprim Rash    Outpatient Encounter Medications as of 12/30/2021  Medication Sig   acetaminophen (TYLENOL) 500 MG tablet Take 1,000 mg by mouth 2 (two) times daily as needed.   alum & mag hydroxide-simeth (MAALOX PLUS) 400-400-40 MG/5ML suspension Take 10 mLs by mouth as needed for indigestion.   atorvastatin (LIPITOR) 10 MG tablet TAKE 1 TABLET ONCE DAILY.   calcium carbonate (OS-CAL) 600 MG TABS tablet Take 600 mg by mouth 2 (two) times daily with a meal.   Camphor-Menthol-Methyl Sal (SALONPAS) 3.03-28-08 % PTCH Apply 1 patch topically 2 (two) times daily.   Cholecalciferol (VITAMIN D) 50 MCG (2000 UT) tablet Take 2,000 Units by mouth daily.   clopidogrel (PLAVIX) 75 MG tablet TAKE 1 TABLET ONCE DAILY.   COENZYME Q-10 PO Take 10 mg by mouth at bedtime.   cyanocobalamin 1000 MCG tablet Take 1,000 mcg by mouth daily.   fluticasone (FLONASE) 50 MCG/ACT nasal spray Place 2 sprays into both nostrils daily as needed for allergies.    isosorbide mononitrate (IMDUR) 60 MG 24 hr tablet Take 2 tablets (120 mg total) by mouth daily.   levocetirizine (XYZAL) 5 MG tablet TAKE 1 TABLET ONCE DAILY IN THE EVENING.   levothyroxine (SYNTHROID) 75 MCG tablet TAKE 1 TABLET ONCE DAILY ON EMPTY STOMACH.   Melatonin 1 MG TABS Take by mouth at bedtime.   memantine (NAMENDA TITRATION PACK) tablet pack Take 5-10 mg by mouth 2 (two) times daily.   Menthol, Topical Analgesic, (BIOFREEZE) 10 % LIQD Apply topically as needed.   methocarbamol (ROBAXIN) 500 MG tablet Take 500 mg by mouth every 6 (six) hours as needed for muscle spasms (Back Pain).   metoprolol succinate (TOPROL-XL) 25 MG 24 hr tablet Take 25 mg by  mouth daily. Take with or immediately following a meal.   MYRBETRIQ 50 MG TB24 tablet TAKE 1 TABLET BY MOUTH DAILY.   nitroGLYCERIN (NITROSTAT) 0.4 MG SL tablet Place 0.4 mg under the tongue every 5 (five) minutes as needed for chest pain. X 3 doses   nystatin (MYCOSTATIN/NYSTOP) powder Apply 1 application topically. apply underneath abd/breast folds As Needed   pantoprazole (PROTONIX) 40 MG tablet Take 1 tablet (40 mg total) by mouth daily.   predniSONE (DELTASONE) 5 MG tablet Take 5 mg by mouth daily.   PSYLLIUM HUSK PO Take 0.4 g by mouth daily. May keep at bedside   ranolazine (RANEXA) 1000 MG SR tablet TAKE 1 TABLET BY MOUTH TWICE DAILY.   tiZANidine (ZANAFLEX) 2 MG tablet Take 1 tablet (2 mg total) by mouth every 8 (eight) hours as needed for muscle spasms.   venlafaxine (EFFEXOR) 75 MG tablet Take 150 mg by mouth. 2 caps= 150 mg; oral  Once A Day   zinc oxide 20 % ointment Apply 1 application topically  as needed for irritation.   No facility-administered encounter medications on file as of 12/30/2021.    Review of Systems  Constitutional: Negative.   HENT: Negative.    Respiratory: Negative.    Cardiovascular:  Positive for chest pain and leg swelling.  Gastrointestinal: Negative.   Genitourinary:  Positive for frequency.  Musculoskeletal:  Positive for arthralgias and myalgias.  Neurological: Negative.   Psychiatric/Behavioral:  Positive for confusion.   All other systems reviewed and are negative.   There were no vitals filed for this visit. There is no height or weight on file to calculate BMI. Physical Exam Vitals and nursing note reviewed.  Constitutional:      Appearance: She is obese.  HENT:     Head: Normocephalic and atraumatic.  Eyes:     Extraocular Movements: Extraocular movements intact.     Conjunctiva/sclera: Conjunctivae normal.     Pupils: Pupils are equal, round, and reactive to light.  Cardiovascular:     Rate and Rhythm: Normal rate and regular  rhythm.  Pulmonary:     Effort: Pulmonary effort is normal.     Breath sounds: Normal breath sounds.  Abdominal:     General: Bowel sounds are normal.     Palpations: Abdomen is soft.  Musculoskeletal:        General: Normal range of motion.  Skin:    General: Skin is warm and dry.  Neurological:     General: No focal deficit present.     Mental Status: She is alert and oriented to person, place, and time.  Psychiatric:        Mood and Affect: Mood normal.        Behavior: Behavior normal.     Labs reviewed: Basic Metabolic Panel: Recent Labs    04/01/21 0000 06/06/21 0000  NA 141 138  K 4.8 4.5  CL 106 107  CO2 27* 23*  BUN 24* 25*  CREATININE 1.2* 1.0  CALCIUM 8.6* 9.0   Liver Function Tests: Recent Labs    04/01/21 0000 06/06/21 0000  AST 10* 12*  ALT 6* 11  ALKPHOS 39 52  ALBUMIN 3.4* 3.7   No results for input(s): "LIPASE", "AMYLASE" in the last 8760 hours. No results for input(s): "AMMONIA" in the last 8760 hours. CBC: Recent Labs    04/01/21 0000 05/23/21 0000 06/06/21 0000  WBC 8.8 9.3 10.8  NEUTROABS 5,315.00  --  7,711.00  HGB 9.8* 10.1* 10.3*  HCT 30* 31* 32*  PLT 238 263 269   Cardiac Enzymes: No results for input(s): "CKTOTAL", "CKMB", "CKMBINDEX", "TROPONINI" in the last 8760 hours. BNP: Invalid input(s): "POCBNP" Lab Results  Component Value Date   HGBA1C 5.1 02/14/2019   Lab Results  Component Value Date   TSH 3.56 04/01/2021   Lab Results  Component Value Date   ENIDPOEU23 536 08/24/2020   Lab Results  Component Value Date   FOLATE 816 08/04/2019   Lab Results  Component Value Date   IRON 47 05/23/2021   TIBC 282 05/23/2021   FERRITIN 25 05/23/2021    Imaging and Procedures obtained prior to SNF admission: CT HEAD WO CONTRAST (5MM)  Result Date: 07/02/2021 CLINICAL DATA:  Increased forgetfulness. EXAM: CT HEAD WITHOUT CONTRAST TECHNIQUE: Contiguous axial images were obtained from the base of the skull through the  vertex without intravenous contrast. RADIATION DOSE REDUCTION: This exam was performed according to the departmental dose-optimization program which includes automated exposure control, adjustment of the mA and/or kV according to patient  size and/or use of iterative reconstruction technique. COMPARISON:  CT head 03/27/2021 FINDINGS: Brain: Moderate atrophy. Moderate white matter changes with diffuse white matter hypodensity, unchanged from the prior study. Negative for acute infarct, hemorrhage, mass. Vascular: Negative for hyperdense vessel. Atherosclerotic calcification in the carotid and vertebral arteries bilaterally. Skull: Negative Sinuses/Orbits: Mild mucosal edema right posterior ethmoid sinus. Remaining sinuses clear. No orbital lesion. Bilateral cataract extraction Other: None IMPRESSION: No acute abnormality no change from earlier this year Moderate atrophy and moderate chronic microvascular ischemic change in the white matter. Electronically Signed   By: Franchot Gallo M.D.   On: 07/02/2021 11:12    Assessment/Plan 1. Chronic heart failure with preserved ejection fraction ([place]) Patient does have some dependent edema.  Managed with Lasix  2. Coronary artery disease involving native coronary artery of native heart without angina pectoris On beta-blocker and Ranexa.  Did have episode of chest pain recently  3. Essential hypertension Blood pressure today 132/70.  Needs include metoprolol  4. Gait instability Using walker primarily but sometimes wheelchair to go greater distances  5. Memory loss Some slight confusion today.  Scored 29/30 on MMSE.  Takes Namenda  6. Polymyalgia rheumatica (HCC) Chronic prednisone at 5 mg/day controlled symptoms    Family/ staff Communication:   Labs/tests ordered:  .smmsig

## 2022-03-31 ENCOUNTER — Encounter: Payer: Self-pay | Admitting: Nurse Practitioner

## 2022-03-31 ENCOUNTER — Non-Acute Institutional Stay: Payer: Medicare Other | Admitting: Nurse Practitioner

## 2022-03-31 DIAGNOSIS — M8000XA Age-related osteoporosis with current pathological fracture, unspecified site, initial encounter for fracture: Secondary | ICD-10-CM

## 2022-03-31 DIAGNOSIS — E039 Hypothyroidism, unspecified: Secondary | ICD-10-CM

## 2022-03-31 DIAGNOSIS — R413 Other amnesia: Secondary | ICD-10-CM | POA: Diagnosis not present

## 2022-03-31 DIAGNOSIS — K219 Gastro-esophageal reflux disease without esophagitis: Secondary | ICD-10-CM

## 2022-03-31 DIAGNOSIS — R350 Frequency of micturition: Secondary | ICD-10-CM

## 2022-03-31 DIAGNOSIS — D631 Anemia in chronic kidney disease: Secondary | ICD-10-CM

## 2022-03-31 DIAGNOSIS — D519 Vitamin B12 deficiency anemia, unspecified: Secondary | ICD-10-CM

## 2022-03-31 DIAGNOSIS — G4733 Obstructive sleep apnea (adult) (pediatric): Secondary | ICD-10-CM

## 2022-03-31 DIAGNOSIS — F339 Major depressive disorder, recurrent, unspecified: Secondary | ICD-10-CM

## 2022-03-31 DIAGNOSIS — R2681 Unsteadiness on feet: Secondary | ICD-10-CM

## 2022-03-31 DIAGNOSIS — E785 Hyperlipidemia, unspecified: Secondary | ICD-10-CM

## 2022-03-31 DIAGNOSIS — N1831 Chronic kidney disease, stage 3a: Secondary | ICD-10-CM

## 2022-03-31 DIAGNOSIS — I10 Essential (primary) hypertension: Secondary | ICD-10-CM

## 2022-03-31 DIAGNOSIS — I2510 Atherosclerotic heart disease of native coronary artery without angina pectoris: Secondary | ICD-10-CM

## 2022-03-31 DIAGNOSIS — M159 Polyosteoarthritis, unspecified: Secondary | ICD-10-CM

## 2022-03-31 DIAGNOSIS — M353 Polymyalgia rheumatica: Secondary | ICD-10-CM

## 2022-03-31 NOTE — Assessment & Plan Note (Addendum)
stable, on Venlafaxine

## 2022-03-31 NOTE — Assessment & Plan Note (Signed)
takes Prednisone '5mg'$  qd(failed GDR)

## 2022-03-31 NOTE — Progress Notes (Signed)
Location:  [facility] Room Number: AL/911/A Place of Service:  ALF (13) Provider:  Lera Gaines X, NP  Patient Care Team: Chen Saadeh X, NP as PCP - General (Internal Medicine) Jettie Booze, MD as PCP - Cardiology (Cardiology) Wilford Corner, MD as Consulting Physician (Gastroenterology) Marlou Sa, Tonna Corner, MD as Consulting Physician (Orthopedic Surgery) Marcial Pacas, MD as Consulting Physician (Neurology) Franchot Gallo, MD as Consulting Physician (Urology) Kary Kos, MD as Consulting Physician (Neurosurgery)  Extended Emergency Contact Information Primary Emergency Contact: Merlinda Frederick Address: [address]           [address] [place] Phone: [phone] Mobile Phone: [phone] Relation: Spouse Secondary Emergency Contact: [place] Mobile Phone: [phone] Relation: Son  Code Status:  DNR Goals of care: Advanced Directive information    03/31/2022   10:17 AM  Advanced Directives  Does Patient Have a Medical Advance Directive? Yes  Type of Advance Directive Living will;Out of facility DNR (pink MOST or yellow form)  Does patient want to make changes to medical advance directive? No - Patient declined     Chief Complaint  Patient presents with   Medical Management of Chronic Issues    Patient is here for a follow up for chronic conditions    Quality Metric Gaps    Patient is due for AWV    HPI:  Pt is a 82 y.o. female seen today for medical management of chronic diseases.      HTN, on Metoprolol             Memory loss: MMSE 29/30 06/06/21, 07/02/21 CT head w/o CM, No acute abnormality no change from earlier this year. Moderate atrophy and moderate chronic microvascular ischemic change in the white matter. On Memantine.               Gait abnormality, uses walker for ambulation, w/c to go further.  CKD IIIa Bun/creat 29/1.4 12/05/21             CAD, on Isosorbide and  Ranolazine, Plavix. LDL 70 08/24/20             Hypothyroidism, on Levothyroxine, TSH 3.03 12/04/21             Anemia, on B12, Iron 47 05/22/21, Hgb 10.3 12/05/21             Urinary frequency and occasional incontinent of urine, on Myrbetriq.             GERD, stable, on Pantoprazole.               Depression, stable, on Venlafaxine             OA, takes Tylenol, Zanaflex, Methocarbamol prn             OP, takes Ca, Vit D. 04/29/21 Dr. Dossie Der dc Fosamax.  05/07/21 DEXA t score -1.273             Vit B12 deficiency, takes Vit B12, Vit B12 162 08/03/19>> Vit B12 869 08/24/20             Hyperlipidemia, taking Atorvastatin.              PMR, takes Prednisone '5mg'$  qd(failed GDR)             OSA, uses CPAP  Past Medical History:  Diagnosis Date   Anemia    Arthritis    "fingers, back, shoulders, hips" (04/10/2016)   Chronic diastolic  CHF (congestive heart failure) (HCC)    a. normal EF, LVEDP at [facility] in 6/17 33 >> Lasix started    CKD (chronic kidney disease), stage III (HCC)    Coronary artery disease    a. s/p CABG 2000 (SVG/ free LIMA Y graft to the diagonal and distal LAD, SVG to the OM 1, and SVG to the PDA) . b. Cath 12/19/2014 90% dSVG to RCA s/p 2 overlapping DES. c. LHC 2016, 2017 no intervention except diuresis needed. d. Low risk nuc 03/2016. e. Cath 12/2017 patent LIMA-LAD, SVG-OM, SVG-PDA but occluded SVG to diag.    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 L by cath note in 2016.   TIA (transient ischemic attack)    Past Surgical History:  Procedure Laterality Date   BREAST BIOPSY Right 1964   CARDIAC CATHETERIZATION  08   patent grafts, no culprit lesions, EF 65%   CARDIAC  CATHETERIZATION N/A 12/19/2014   Procedure: Left Heart Cath and Cors/Grafts Angiography;  Surgeon: Jettie Booze, MD;  Location: [facility];  Service: Cardiovascular;  Laterality: N/A;   CARDIAC CATHETERIZATION N/A 12/19/2014   Procedure: Coronary Stent Intervention;  Surgeon: Jettie Booze, MD;  Location: [facility];  Service: Cardiovascular;  Laterality: N/A;   CARDIAC CATHETERIZATION N/A 01/01/2015   Procedure: Left Heart Cath and Cors/Grafts Angiography;  Surgeon: Jettie Booze, MD;  Location: [facility];  Service: Cardiovascular;  Laterality: N/A;   CARDIAC CATHETERIZATION N/A 09/20/2015   Procedure: Left Heart Cath and Cors/Grafts Angiography;  Surgeon: Jettie Booze, MD;  Location: [facility];  Service: Cardiovascular;  Laterality: N/A;   CATARACT EXTRACTION W/ INTRAOCULAR LENS  IMPLANT, BILATERAL Bilateral 2011   COLONOSCOPY WITH PROPOFOL N/A 07/27/2016   Procedure: COLONOSCOPY WITH PROPOFOL;  Surgeon: Wilford Corner, MD;  Location: [facility];  Service: Endoscopy;  Laterality: N/A;   CORONARY ARTERY BYPASS GRAFT  2000   ASCVD, multivessel, S./P.   DILATION AND CURETTAGE OF UTERUS  1980s   ESOPHAGOGASTRODUODENOSCOPY (EGD) WITH PROPOFOL N/A 07/27/2016   Procedure: ESOPHAGOGASTRODUODENOSCOPY (EGD) WITH PROPOFOL;  Surgeon: Wilford Corner, MD;  Location: [facility];  Service: Endoscopy;  Laterality: N/A;   FRACTURE SURGERY     LEFT HEART CATH AND CORS/GRAFTS ANGIOGRAPHY N/A 01/12/2018   Procedure: LEFT HEART CATH AND CORS/GRAFTS ANGIOGRAPHY;  Surgeon: Jettie Booze, MD;  Location: [facility];  Service: Cardiovascular;  Laterality: N/A;   PATELLA FRACTURE SURGERY Left 1993    Allergies  Allergen Reactions   Nsaids Other (See Comments)    Due to chronic kidney failure   Butorphanol Other (See Comments)    agitation Constipation Produced a lot of urine, made patient feel crazy   Sulfa Antibiotics Rash    Bisoprolol Fumarate Other (See Comments)    Doesn't remember   Butorphanol Tartrate Other (See Comments)    Produced a lot of urine, made patient feel crazy   Codeine Nausea And Vomiting   Demerol [Meperidine] Nausea And Vomiting   Imipramine     Sweating, facial dysfunction  Meperidine And Related Nausea And Vomiting   Statins     MYALGIAS   Tequin [Gatifloxacin] Other (See Comments)    Caused hypoglycemia Low blood sugar   Brilinta [Ticagrelor] Rash    CAUSES PETECHIAE, PURPURA   Pseudoephedrine Hcl Palpitations   Septra [Sulfamethoxazole-Trimethoprim] Rash   Sulfamethoxazole-Trimethoprim Rash    Outpatient Encounter Medications as of 03/31/2022  Medication Sig   acetaminophen (TYLENOL) 500 MG tablet Take 1,000 mg by mouth 2 (two) times daily as needed.   alum & mag hydroxide-simeth (MAALOX PLUS) 400-400-40 MG/5ML suspension Take 10 mLs by mouth as needed for indigestion.   atorvastatin (LIPITOR) 10 MG tablet TAKE 1 TABLET ONCE DAILY.   calcium carbonate (OS-CAL) 600 MG TABS tablet Take 600 mg by mouth 2 (two) times daily with a meal.   Camphor-Menthol-Methyl Sal (SALONPAS) 3.03-28-08 % PTCH Apply 1 patch topically 2 (two) times daily.   Cholecalciferol (VITAMIN D) 50 MCG (2000 UT) tablet Take 2,000 Units by mouth daily.   clopidogrel (PLAVIX) 75 MG tablet TAKE 1 TABLET ONCE DAILY.   COENZYME Q-10 PO Take 10 mg by mouth at bedtime.   cyanocobalamin 1000 MCG tablet Take 1,000 mcg by mouth daily.   fluticasone (FLONASE) 50 MCG/ACT nasal spray Place 2 sprays into both nostrils daily as needed for allergies.    isosorbide mononitrate (IMDUR) 60 MG 24 hr tablet Take 2 tablets (120 mg total) by mouth daily.   levocetirizine (XYZAL) 5 MG tablet TAKE 1 TABLET ONCE DAILY IN THE EVENING.   levothyroxine (SYNTHROID) 75 MCG tablet TAKE 1 TABLET ONCE DAILY ON EMPTY STOMACH.   Melatonin 1 MG TABS Take by mouth at bedtime.   memantine (NAMENDA TITRATION PACK) tablet pack Take 5-10 mg by mouth 2  (two) times daily.   Menthol, Topical Analgesic, (BIOFREEZE) 10 % LIQD Apply topically as needed.   methocarbamol (ROBAXIN) 500 MG tablet Take 500 mg by mouth every 6 (six) hours as needed for muscle spasms (Back Pain).   metoprolol succinate (TOPROL-XL) 25 MG 24 hr tablet Take 25 mg by mouth daily. Take with or immediately following a meal.   MYRBETRIQ 50 MG TB24 tablet TAKE 1 TABLET BY MOUTH DAILY.   nitroGLYCERIN (NITROSTAT) 0.4 MG SL tablet Place 0.4 mg under the tongue every 5 (five) minutes as needed for chest pain. X 3 doses   nystatin (MYCOSTATIN/NYSTOP) powder Apply 1 application topically. apply underneath abd/breast folds As Needed   pantoprazole (PROTONIX) 40 MG tablet Take 1 tablet (40 mg total) by mouth daily.   predniSONE (DELTASONE) 5 MG tablet Take 5 mg by mouth daily.   PSYLLIUM HUSK PO Take 0.4 g by mouth daily. May keep at bedside   ranolazine (RANEXA) 1000 MG SR tablet TAKE 1 TABLET BY MOUTH TWICE DAILY.   tiZANidine (ZANAFLEX) 2 MG tablet Take 1 tablet (2 mg total) by mouth every 8 (eight) hours as needed for muscle spasms.   venlafaxine (EFFEXOR) 75 MG tablet Take 150 mg by mouth. 2 caps= 150 mg; oral  Once A Day   zinc oxide 20 % ointment Apply 1 application topically as needed for irritation.   No facility-administered encounter medications on file as of 03/31/2022.    Review of Systems  Constitutional:  Negative for activity change, appetite change and fever.  HENT:  Positive for hearing loss. Negative for congestion and voice change.   Eyes:  Negative for visual disturbance.  Respiratory:  Positive for shortness of breath. Negative for cough and wheezing.  DOE is chronic, state feel empty right chest, something in the left chest.   Cardiovascular:  Positive for leg swelling.  Gastrointestinal:  Negative for abdominal pain and constipation.  Genitourinary:  Negative for dysuria, frequency and urgency.       1-2x/night bathroom trips.   Musculoskeletal:   Positive for arthralgias, gait problem and myalgias.  Skin:  Negative for color change.  Neurological:  Negative for speech difficulty, weakness and headaches.       Memory lapses.   Psychiatric/Behavioral:  Negative for behavioral problems, hallucinations and sleep disturbance. The patient is not nervous/anxious.     Immunization History  Administered Date(s) Administered   Fluad Quad(high Dose 65+) 01/12/2022   HPV Bivalent 11/24/2013, 01/17/2016   Influenza Whole 12/23/2017   Influenza, High Dose Seasonal PF 12/24/2018, 12/24/2018   Influenza-Unspecified 01/10/2013, 12/27/2014, 12/30/2016, 01/02/2020, 01/09/2021   Moderna SARS-COV2 Booster Vaccination 08/08/2021   Moderna Sars-Covid-2 Vaccination 03/25/2019, 04/22/2019, 01/30/2020, 08/20/2020, 12/10/2020   Pfizer Covid-19 Vaccine Bivalent Booster 27yr & up 01/22/2022   Pneumococcal Conjugate-13 04/21/2013   Pneumococcal Polysaccharide-23 02/27/1993, 03/23/2009   Tdap 04/20/2012   Zoster Recombinat (Shingrix) 03/29/2021, 06/09/2021   Zoster, Live 11/10/2010   Pertinent  Health Maintenance Due  Topic Date Due   INFLUENZA VACCINE  Completed   DEXA SCAN  Completed      10/14/2018    8:00 PM 10/15/2018    8:00 AM 02/10/2019    9:44 AM 03/27/2021    9:56 PM 03/31/2022   10:18 AM  Fall Risk  Falls in the past year?   0  0  Was there an injury with Fall?     0  Fall Risk Category Calculator     0  Fall Risk Category     Low  Patient Fall Risk Level High fall risk Moderate fall risk  Moderate fall risk Low fall risk  Patient at Risk for Falls Due to     No Fall Risks  Fall risk Follow up     Falls evaluation completed   Functional Status Survey:    Vitals:   03/31/22 1009  BP: 133/60  Pulse: 62  Resp: 18  Temp: 98.4 F (36.9 C)  SpO2: 95%  Weight: 196 lb (88.9 kg)  Height: '4\' 10"'$  (1.473 m)   Body mass index is 40.96 kg/m. Physical Exam Vitals and nursing note reviewed.  Constitutional:      Appearance: Normal  appearance.  HENT:     Head: Normocephalic and atraumatic.     Mouth/Throat:     Mouth: Mucous membranes are moist.  Eyes:     Extraocular Movements: Extraocular movements intact.     Conjunctiva/sclera: Conjunctivae normal.     Pupils: Pupils are equal, round, and reactive to light.  Cardiovascular:     Rate and Rhythm: Normal rate and regular rhythm.     Heart sounds:     No gallop.  Pulmonary:     Effort: Pulmonary effort is normal.     Breath sounds: No rales.  Abdominal:     General: Bowel sounds are normal.     Palpations: Abdomen is soft.     Tenderness: There is no abdominal tenderness.  Musculoskeletal:     Cervical back: Normal range of motion and neck supple.     Right lower leg: Edema present.     Left lower leg: Edema present.     Comments: Trace edema RLE, fingers.   Skin:    General: Skin is warm and  dry.  Neurological:     General: No focal deficit present.     Mental Status: She is alert and oriented to person, place, and time. Mental status is at baseline.     Gait: Gait abnormal.  Psychiatric:        Mood and Affect: Mood normal.        Behavior: Behavior normal.        Thought Content: Thought content normal.        Judgment: Judgment normal.     Labs reviewed: Recent Labs    04/01/21 0000 06/06/21 0000 12/05/21 0000  NA 141 138 140  K 4.8 4.5 4.4  CL 106 107 109*  CO2 27* 23* 24*  BUN 24* 25* 29*  CREATININE 1.2* 1.0 1.4*  CALCIUM 8.6* 9.0 8.7   Recent Labs    04/01/21 0000 06/06/21 0000 12/05/21 0000  AST 10* 12* 11*  ALT 6* 11 7  ALKPHOS 39 52 49  ALBUMIN 3.4* 3.7 3.9   Recent Labs    04/01/21 0000 05/23/21 0000 06/06/21 0000 12/05/21 0000  WBC 8.8 9.3 10.8 12.2  NEUTROABS 5,315.00  --  7,711.00 8,747.00  HGB 9.8* 10.1* 10.3* 10.3*  HCT 30* 31* 32* 32*  PLT 238 263 269 268   Lab Results  Component Value Date   TSH 3.56 04/01/2021   Lab Results  Component Value Date   HGBA1C 5.1 02/14/2019   Lab Results   Component Value Date   CHOL 151 08/24/2020   HDL 57 08/24/2020   LDLCALC 70 08/24/2020   TRIG 162 (A) 08/24/2020   CHOLHDL 2.1 06/07/2018    Significant Diagnostic Results in last 30 days:  No results found.  Assessment/Plan Essential hypertension Blood pressure is controlled, on Metoprolol  Memory loss MMSE 29/30 06/06/21, 07/02/21 CT head w/o CM, No acute abnormality no change from earlier this year. Moderate atrophy and moderate chronic microvascular ischemic change in the white matter. On Memantine.   Gait instability uses walker for ambulation, w/c to go further.   CKD (chronic kidney disease) stage 3, GFR 30-59 ml/min (HCC) Bun/creat 29/1.4 12/05/21  CAD (coronary artery disease) Bun/creat 29/1.4 12/05/21  Hypothyroidism on Levothyroxine, TSH 3.03 12/04/21  Anemia due to chronic kidney disease  on B12, Iron 47 05/22/21, Hgb 10.3 12/05/21  Urinary frequency  Urinary frequency and occasional incontinent of urine, on Myrbetriq.  GERD (gastroesophageal reflux disease) stable, on Pantoprazole.    Depression, recurrent ([place])  stable, on Venlafaxine  Osteoporosis takes Ca, Vit D. 04/29/21 Dr. Dossie Der dc Fosamax.  05/07/21 DEXA t score -1.273  Generalized osteoarthritis of multiple sites takes Tylenol, Zanaflex, Methocarbamol prn  Vitamin B12 deficiency anemia  takes Vit B12, Vit B12 162 08/03/19>> Vit B12 869 08/24/20  Hyperlipidemia LDL goal <70 taking Atorvastatin.   Polymyalgia rheumatica (HCC)  takes Prednisone '5mg'$  qd(failed GDR)     Family/ staff Communication: plan of care reviewed with the patient and charge nurse.   Labs/tests ordered:  none  Time spend 40 minutes.

## 2022-03-31 NOTE — Assessment & Plan Note (Signed)
MMSE 29/30 06/06/21, 07/02/21 CT head w/o CM, No acute abnormality no change from earlier this year. Moderate atrophy and moderate chronic microvascular ischemic change in the white matter. On Memantine.

## 2022-03-31 NOTE — Assessment & Plan Note (Addendum)
on Levothyroxine, TSH 3.03 12/04/21

## 2022-03-31 NOTE — Assessment & Plan Note (Signed)
on B12, Iron 47 05/22/21, Hgb 10.3 12/05/21

## 2022-03-31 NOTE — Assessment & Plan Note (Signed)
takes Tylenol, Zanaflex, Methocarbamol prn

## 2022-03-31 NOTE — Assessment & Plan Note (Signed)
Bun/creat 29/1.4 12/05/21

## 2022-03-31 NOTE — Assessment & Plan Note (Addendum)
takes Ca, Vit D. 04/29/21 Dr. Dossie Der dc Fosamax.  05/07/21 DEXA t score -1.273

## 2022-03-31 NOTE — Assessment & Plan Note (Signed)
taking Atorvastatin.

## 2022-03-31 NOTE — Assessment & Plan Note (Signed)
takes Vit B12, Vit B12 162 08/03/19>> Vit B12 869 08/24/20

## 2022-03-31 NOTE — Assessment & Plan Note (Signed)
uses CPAP

## 2022-03-31 NOTE — Assessment & Plan Note (Signed)
stable, on Pantoprazole  

## 2022-03-31 NOTE — Assessment & Plan Note (Signed)
Blood pressure is controlled, on Metoprolol 

## 2022-03-31 NOTE — Assessment & Plan Note (Signed)
uses walker for ambulation, w/c to go further.

## 2022-03-31 NOTE — Assessment & Plan Note (Signed)
Urinary frequency and occasional incontinent of urine, on Myrbetriq.

## 2022-04-03 ENCOUNTER — Encounter: Payer: Self-pay | Admitting: Nurse Practitioner

## 2022-04-03 ENCOUNTER — Non-Acute Institutional Stay (SKILLED_NURSING_FACILITY): Payer: Medicare Other | Admitting: Nurse Practitioner

## 2022-04-03 DIAGNOSIS — Z0000 Encounter for general adult medical examination without abnormal findings: Secondary | ICD-10-CM | POA: Diagnosis not present

## 2022-04-06 ENCOUNTER — Encounter: Payer: Self-pay | Admitting: Nurse Practitioner

## 2022-04-06 NOTE — Progress Notes (Addendum)
Subjective:   Erin Ingram is a 82 y.o. female who presents for Medicare Annual (Subsequent) preventive examination @ [facility]     Objective:    Today's Vitals   04/03/22 1409  BP: 115/79  Pulse: 68  Resp: 17  Temp: 98.4 F (36.9 C)  SpO2: 94%  Weight: 196 lb (88.9 kg)  Height: '4\' 10"'$  (1.473 m)   Body mass index is 40.96 kg/m.     04/03/2022    2:08 PM 03/31/2022   10:17 AM 08/25/2021   11:05 AM 08/19/2021    3:40 PM 07/18/2021   10:53 AM 06/06/2021    3:55 PM 04/29/2021   11:40 AM  Advanced Directives  Does Patient Have a Medical Advance Directive? Yes Yes Yes Yes Yes Yes Yes  Type of Advance Directive Living will;Out of facility DNR (pink MOST or yellow form) Living will;Out of facility DNR (pink MOST or yellow form) [place];Living will;Out of facility DNR (pink MOST or yellow form) Out of facility DNR (pink MOST or yellow form);Living will [place];Living will;Out of facility DNR (pink MOST or yellow form) [place];Living will;Out of facility DNR (pink MOST or yellow form) [place];Living will;Out of facility DNR (pink MOST or yellow form)  Does patient want to make changes to medical advance directive? No - Patient declined No - Patient declined No - Patient declined No - Patient declined No - Patient declined No - Patient declined No - Patient declined  Copy of [place] in Chart?   Yes - validated most recent copy scanned in chart (See row information)  Yes - validated most recent copy scanned in chart (See row information) Yes - validated most recent copy scanned in chart (See row information) Yes - validated most recent copy scanned in chart (See row information)  Pre-existing out of facility DNR order (yellow form or pink MOST form)   Pink MOST/Yellow Form most recent copy in chart - Physician notified to receive inpatient order Pink MOST form placed in chart  (order not valid for inpatient use);Yellow form placed in chart (order not valid for inpatient use) Yellow form placed in chart (order not valid for inpatient use);Pink MOST form placed in chart (order not valid for inpatient use) Pink MOST form placed in chart (order not valid for inpatient use);Yellow form placed in chart (order not valid for inpatient use) Pink MOST form placed in chart (order not valid for inpatient use);Yellow form placed in chart (order not valid for inpatient use)    Current Medications (verified) Outpatient Encounter Medications as of 04/03/2022  Medication Sig   acetaminophen (TYLENOL) 500 MG tablet Take 1,000 mg by mouth 2 (two) times daily as needed.   alum & mag hydroxide-simeth (MAALOX PLUS) 400-400-40 MG/5ML suspension Take 10 mLs by mouth as needed for indigestion.   atorvastatin (LIPITOR) 10 MG tablet TAKE 1 TABLET ONCE DAILY.   calcium carbonate (OS-CAL) 600 MG TABS tablet Take 600 mg by mouth 2 (two) times daily with a meal.   Camphor-Menthol-Methyl Sal (SALONPAS) 3.03-28-08 % PTCH Apply 1 patch topically 2 (two) times daily.   Cholecalciferol (VITAMIN D) 50 MCG (2000 UT) tablet Take 2,000 Units by mouth daily.   clopidogrel (PLAVIX) 75 MG tablet TAKE 1 TABLET ONCE DAILY.   COENZYME Q-10 PO Take 10 mg by mouth at bedtime.   cyanocobalamin 1000 MCG tablet Take 1,000 mcg by mouth daily.   fluticasone (FLONASE)  50 MCG/ACT nasal spray Place 2 sprays into both nostrils daily as needed for allergies.    isosorbide mononitrate (IMDUR) 60 MG 24 hr tablet Take 2 tablets (120 mg total) by mouth daily.   levocetirizine (XYZAL) 5 MG tablet TAKE 1 TABLET ONCE DAILY IN THE EVENING.   levothyroxine (SYNTHROID) 75 MCG tablet TAKE 1 TABLET ONCE DAILY ON EMPTY STOMACH.   Melatonin 1 MG TABS Take by mouth at bedtime.   memantine (NAMENDA TITRATION PACK) tablet pack Take 5-10 mg by mouth 2 (two) times daily.   Menthol, Topical Analgesic, (BIOFREEZE) 10 % LIQD Apply topically as  needed.   methocarbamol (ROBAXIN) 500 MG tablet Take 500 mg by mouth every 6 (six) hours as needed for muscle spasms (Back Pain).   metoprolol succinate (TOPROL-XL) 25 MG 24 hr tablet Take 25 mg by mouth daily. Take with or immediately following a meal.   MYRBETRIQ 50 MG TB24 tablet TAKE 1 TABLET BY MOUTH DAILY.   nitroGLYCERIN (NITROSTAT) 0.4 MG SL tablet Place 0.4 mg under the tongue every 5 (five) minutes as needed for chest pain. X 3 doses   nystatin (MYCOSTATIN/NYSTOP) powder Apply 1 application topically. apply underneath abd/breast folds As Needed   pantoprazole (PROTONIX) 40 MG tablet Take 1 tablet (40 mg total) by mouth daily.   predniSONE (DELTASONE) 5 MG tablet Take 5 mg by mouth daily.   PSYLLIUM HUSK PO Take 0.4 g by mouth daily. May keep at bedside   ranolazine (RANEXA) 1000 MG SR tablet TAKE 1 TABLET BY MOUTH TWICE DAILY.   tiZANidine (ZANAFLEX) 2 MG tablet Take 1 tablet (2 mg total) by mouth every 8 (eight) hours as needed for muscle spasms.   venlafaxine (EFFEXOR) 75 MG tablet Take 150 mg by mouth. 2 caps= 150 mg; oral  Once A Day   zinc oxide 20 % ointment Apply 1 application topically as needed for irritation.   No facility-administered encounter medications on file as of 04/03/2022.    Allergies (verified) Nsaids, Butorphanol, Sulfa antibiotics, Bisoprolol fumarate, Butorphanol tartrate, Codeine, Demerol [meperidine], Imipramine, Meperidine and related, Statins, Tequin [gatifloxacin], Brilinta [ticagrelor], Pseudoephedrine hcl, Septra [sulfamethoxazole-trimethoprim], and Sulfamethoxazole-trimethoprim   History: Past Medical History:  Diagnosis Date   Anemia    Arthritis    "fingers, back, shoulders, hips" (04/10/2016)   Chronic diastolic CHF (congestive heart failure) (HCC)    a. normal EF, LVEDP at [facility] in 6/17 33 >> Lasix started    CKD (chronic kidney disease), stage III (HCC)    Coronary artery disease    a. s/p CABG 2000 (SVG/ free LIMA Y graft to the diagonal  and distal LAD, SVG to the OM 1, and SVG to the PDA) . b. Cath 12/19/2014 90% dSVG to RCA s/p 2 overlapping DES. c. LHC 2016, 2017 no intervention except diuresis needed. d. Low risk nuc 03/2016. e. Cath 12/2017 patent LIMA-LAD, SVG-OM, SVG-PDA but occluded SVG to diag.    Depression    with anxious component   GERD (gastroesophageal reflux disease)    History of hiatal hernia    Hyperlipidemia    Hypothyroidism    Obesity    OSA on CPAP    PMR (polymyalgia rheumatica) (HCC)    Pneumonia    "2-3 times" (04/10/2016)   Stable angina    microvascular, improved with Ranexa   Subclavian artery stenosis ([place])    a. L by cath note in 2016.   TIA (transient ischemic attack)    Past Surgical History:  Procedure Laterality Date  BREAST BIOPSY Right 1964   CARDIAC CATHETERIZATION  08   patent grafts, no culprit lesions, EF 65%   CARDIAC CATHETERIZATION N/A 12/19/2014   Procedure: Left Heart Cath and Cors/Grafts Angiography;  Surgeon: Jettie Booze, MD;  Location: [place] [facility];  Service: Cardiovascular;  Laterality: N/A;   CARDIAC CATHETERIZATION N/A 12/19/2014   Procedure: Coronary Stent Intervention;  Surgeon: Jettie Booze, MD;  Location: [place] [facility];  Service: Cardiovascular;  Laterality: N/A;   CARDIAC CATHETERIZATION N/A 01/01/2015   Procedure: Left Heart Cath and Cors/Grafts Angiography;  Surgeon: Jettie Booze, MD;  Location: [facility] [place] [facility];  Service: Cardiovascular;  Laterality: N/A;   CARDIAC CATHETERIZATION N/A 09/20/2015   Procedure: Left Heart Cath and Cors/Grafts Angiography;  Surgeon: Jettie Booze, MD;  Location: [place] [facility];  Service: Cardiovascular;  Laterality: N/A;   CATARACT EXTRACTION W/ INTRAOCULAR LENS  IMPLANT, BILATERAL Bilateral 2011   COLONOSCOPY WITH PROPOFOL N/A 07/27/2016   Procedure: COLONOSCOPY WITH PROPOFOL;  Surgeon: Wilford Corner, MD;  Location: [facility];  Service: Endoscopy;  Laterality: N/A;   CORONARY ARTERY  BYPASS GRAFT  2000   ASCVD, multivessel, S./P.   DILATION AND CURETTAGE OF UTERUS  1980s   ESOPHAGOGASTRODUODENOSCOPY (EGD) WITH PROPOFOL N/A 07/27/2016   Procedure: ESOPHAGOGASTRODUODENOSCOPY (EGD) WITH PROPOFOL;  Surgeon: Wilford Corner, MD;  Location: [facility];  Service: Endoscopy;  Laterality: N/A;   FRACTURE SURGERY     LEFT HEART CATH AND CORS/GRAFTS ANGIOGRAPHY N/A 01/12/2018   Procedure: LEFT HEART CATH AND CORS/GRAFTS ANGIOGRAPHY;  Surgeon: Jettie Booze, MD;  Location: [place] [facility];  Service: Cardiovascular;  Laterality: N/A;   PATELLA FRACTURE SURGERY Left 1993   Family History  Problem Relation Age of Onset   Heart disease Mother    Heart attack Mother    Heart disease Father    Heart attack Father    Diabetes Brother    Pulmonary embolism Brother    Stroke Paternal Grandmother    Hypertension Neg Hx    Social History   Socioeconomic History   Marital status: Married    Spouse name: Not on file   Number of children: 2   Years of education: College   Highest education level: Not on file  Occupational History   Occupation: Retired Cytogeneticist  Tobacco Use   Smoking status: Former    Packs/day: 0.50    Years: 7.00    Total pack years: 3.50    Types: Cigarettes    Quit date: 08/21/1969    Years since quitting: 52.6   Smokeless tobacco: Never  Vaping Use   Vaping Use: Never used  Substance and Sexual Activity   Alcohol use: No    Alcohol/week: 0.0 standard drinks of alcohol    Comment: rare   Drug use: No   Sexual activity: Never  Other Topics Concern   Not on file  Social History Narrative   Social History     Social History Narrative       Lives in [place] with husband.   Diet: Regular   Do you drink/eat things with caffeine? 1 cup caffeine per day.  Not much coffee   Marital status: Married                           What year were you married? 1964   Do you live in a house, apartment, assisted living, condo, trailer, etc)?  Here  at [facility]   Is it one or more stories?   How many persons live in your home? 2   Do you have any pets in your home? No   Current or past profession: Engineer, production (RN)   Do you exercise? I did                                                Type & how often: Swimming, Tai Chi, exercise classes   Do you have a living will? yes   Do you have a DNR Form? Yes   Do you have a POA/HPOA forms?             Social Determinants of Health   Financial Resource Strain: Low Risk  (12/07/2017)   Overall Financial Resource Strain (CARDIA)    Difficulty of Paying Living Expenses: Not hard at all  Food Insecurity: No Food Insecurity (12/07/2017)   Hunger Vital Sign    Worried About Running Out of Food in the Last Year: Never true    Ran Out of Food in the Last Year: Never true  Transportation Needs: No Transportation Needs (12/07/2017)   PRAPARE - Hydrologist (Medical): No    Lack of Transportation (Non-Medical): No  Physical Activity: Inactive (12/07/2017)   Exercise Vital Sign    Days of Exercise per Week: 0 days    Minutes of Exercise per Session: 0 min  Stress: Stress Concern Present (12/07/2017)   [place]    Feeling of Stress : To some extent  Social Connections: Somewhat Isolated (12/07/2017)   Social Connection and Isolation Panel [NHANES]    Frequency of Communication with Friends and Family: More than three times a week    Frequency of Social Gatherings with Friends and Family: More than three times a week    Attends Religious Services: Never    Marine scientist or Organizations: No    Attends Music therapist: Never    Marital Status: Married    Tobacco Counseling Counseling given: Not Answered   Clinical Intake:  Pre-visit preparation completed: Yes  Pain : No/denies pain     BMI - recorded: 40.96 Nutritional Status: BMI > 30  Obese Nutritional Risks:  None Diabetes: No  How often do you need to have someone help you when you read instructions, pamphlets, or other written materials from your doctor or pharmacy?: 1 - Never What is the last grade level you completed in school?: college  Diabetic?no  Interpreter Needed?: No  Information entered by :: Toussaint Golson Bretta Bang NP   Activities of Daily Living    04/06/2022   11:24 AM  In your present state of health, do you have any difficulty performing the following activities:  Hearing? 1  Comment hearing aids  Vision? 0  Difficulty concentrating or making decisions? 0  Walking or climbing stairs? 1  Dressing or bathing? 1  Doing errands, shopping? 1  Preparing Food and eating ? N  Using the Toilet? N  In the past six months, have you accidently leaked urine? Y  Do you have problems with loss of bowel control? N  Managing your Medications? Y  Managing your Finances? Y  Housekeeping or managing your Housekeeping? Y    Patient Care Team: Odette Watanabe X,  NP as PCP - General (Internal Medicine) Jettie Booze, MD as PCP - Cardiology (Cardiology) Wilford Corner, MD as Consulting Physician (Gastroenterology) Marlou Sa Tonna Corner, MD as Consulting Physician (Orthopedic Surgery) Marcial Pacas, MD as Consulting Physician (Neurology) Franchot Gallo, MD as Consulting Physician (Urology) Kary Kos, MD as Consulting Physician (Neurosurgery)  Indicate any recent [place] you may have received from other than [facility] providers in the past year (date may be approximate).     Assessment:   This is a routine wellness examination for [place].  Hearing/Vision screen No results found.  Dietary issues and exercise activities discussed: Current Exercise Habits: The patient does not participate in regular exercise at present, Exercise limited by: orthopedic condition(s)   Goals Addressed             This Visit's Progress    Maintain Mobility and Function       Evidence-based  guidance:  Acknowledge and validate impact of pain, loss of strength and potential disfigurement (hand osteoarthritis) on mental health and daily life, such as social isolation, anxiety, depression, impaired sexual relationship and   injury from falls.  Anticipate referral to physical or occupational therapy for assessment, therapeutic exercise and recommendation for adaptive equipment or assistive devices; encourage participation.  Assess impact on ability to perform activities of daily living, as well as engage in sports and leisure events or requirements of work or school.  Provide anticipatory guidance and reassurance about the benefit of exercise to maintain function; acknowledge and normalize fear that exercise may worsen symptoms.  Encourage regular exercise, at least 10 minutes at a time for 45 minutes per week; consider yoga, water exercise and proprioceptive exercises; encourage use of wearable activity tracker to increase motivation and adherence.  Encourage maintenance or resumption of daily activities, including employment, as pain allows and with minimal exposure to trauma.  Assist patient to advocate for adaptations to the work environment.  Consider level of pain and function, gender, age, lifestyle, patient preference, quality of life, readiness and ?ocapacity to benefit? when recommending patients for orthopaedic surgery consultation.  Explore strategies, such as changes to medication regimen or activity that enables patient to anticipate and manage flare-ups that increase deconditioning and disability.  Explore patient preferences; encourage exposure to a broader range of activities that have been avoided for fear of experiencing pain.  Identify barriers to participation in therapy or exercise, such as pain with activity, anticipated or imagined pain.  Monitor postoperative joint replacement or any preexisting joint replacement for ongoing pain and loss of function; provide social  support and encouragement throughout recovery.   Notes:        Depression Screen    12/07/2017    1:11 PM 03/22/2015    2:38 PM 02/25/2015    5:25 PM  PHQ 2/9 Scores  PHQ - 2 Score 2 0 1  PHQ- 9 Score 12      Fall Risk    04/03/2022    2:08 PM 03/31/2022   10:18 AM 02/10/2019    9:44 AM 10/06/2018    1:35 PM 08/18/2018    2:55 PM  Fall Risk   Falls in the past year? 0 0 0 1 1  Number falls in past yr: 0 0 0 0 0  Injury with Fall? 0 0  0 1  Risk for fall due to : No Fall Risks No Fall Risks     Follow up Falls evaluation completed Falls evaluation completed       FALL RISK  PREVENTION PERTAINING TO THE HOME:  Any stairs in or around the home? Yes  If so, are there any without handrails? No  Home free of loose throw rugs in walkways, pet beds, electrical cords, etc? Yes  Adequate lighting in your home to reduce risk of falls? Yes   ASSISTIVE DEVICES UTILIZED TO PREVENT FALLS:  Life alert? No  Use of a cane, walker or w/c? Yes  Grab bars in the bathroom? Yes  Shower chair or bench in shower? Yes  Elevated toilet seat or a handicapped toilet? Yes   TIMED UP AND GO:  Was the test performed? Yes .  Length of time to ambulate 10 feet: 15 sec.   Gait slow and steady with assistive device  Cognitive Function:    04/09/2022   12:17 PM 04/09/2022   12:00 PM 06/15/2017    9:21 AM 06/10/2017    2:22 PM  MMSE - Mini Mental State Exam  Orientation to time '4 4 5 5  '$ Orientation to Place '5 5 5 5  '$ Registration '3 3 3 3  '$ Attention/ Calculation '5 5 5 5  '$ Recall '3 3 3 3  '$ Language- name 2 objects '2 2 2 2  '$ Language- repeat '1  1 1  '$ Language- follow 3 step command '3  3 3  '$ Language- read & follow direction '1  1 1  '$ Write a sentence '1  1 1  '$ Copy design '1  1 1  '$ Total score '29  30 30        '$ Immunizations Immunization History  Administered Date(s) Administered   Fluad Quad(high Dose 65+) 01/12/2022   HPV Bivalent 11/24/2013, 01/17/2016   Influenza Whole 12/23/2017   Influenza,  High Dose Seasonal PF 12/24/2018, 12/24/2018   Influenza-Unspecified 01/10/2013, 12/27/2014, 12/30/2016, 01/02/2020, 01/09/2021   Moderna SARS-COV2 Booster Vaccination 08/08/2021   Moderna Sars-Covid-2 Vaccination 03/25/2019, 04/22/2019, 01/30/2020, 08/20/2020, 12/10/2020   Pfizer Covid-19 Vaccine Bivalent Booster 63yr & up 01/22/2022   Pneumococcal Conjugate-13 04/21/2013   Pneumococcal Polysaccharide-23 02/27/1993, 03/23/2009   Tdap 04/20/2012   Zoster Recombinat (Shingrix) 03/29/2021, 06/09/2021   Zoster, Live 11/10/2010    TDAP status: Up to date  Flu Vaccine status: Up to date  Pneumococcal vaccine status: Up to date  Covid-19 vaccine status: Information provided on how to obtain vaccines.   Qualifies for Shingles Vaccine? Yes   Zostavax completed Yes   Shingrix Completed?: Yes  Screening Tests Health Maintenance  Topic Date Due   COVID-19 Vaccine (8 - 2023-24 season) 03/19/2022   DTaP/Tdap/Td (2 - Td or Tdap) 04/20/2022   Medicare Annual Wellness (AWV)  04/04/2023   Pneumonia Vaccine 82 Years old  Completed   INFLUENZA VACCINE  Completed   DEXA SCAN  Completed   Zoster Vaccines- Shingrix  Completed   HPV VACCINES  Aged Out    Health Maintenance  Health Maintenance Due  Topic Date Due   COVID-19 Vaccine (8 - 2023-24 season) 03/19/2022    Colorectal cancer screening: No longer required.   Mammogram status: No longer required due to aged out.  05/08/21 DEXA t score -1.273  Lung Cancer Screening: (Low Dose CT Chest recommended if Age 83-80years, 30 pack-year currently smoking OR have quit w/in 15years.) does not qualify.     Additional Screening:  Hepatitis C Screening: does not qualify;   Vision Screening: Recommended annual ophthalmology exams for early detection of glaucoma and other disorders of the eye. Is the patient up to date with their annual eye exam?  Yes  Who  is the provider or what is the name of the office in which the patient attends  annual eye exams? HPOA will provide If pt is not established with a provider, would they like to be referred to a provider to establish care? No .   Dental Screening: Recommended annual dental exams for proper oral hygiene  Community Resource Referral / Chronic Care Management: CRR required this visit?  No   CCM required this visit?  No      Plan:    Pending MMSE Due Eye exam, DEXA if HPOA desires.  I have personally reviewed and noted the following in the patient's chart:   Medical and social history Use of alcohol, tobacco or illicit drugs  Current medications and supplements including opioid prescriptions. Patient is not currently taking opioid prescriptions. Functional ability and status Nutritional status Physical activity Advanced directives List of other physicians Hospitalizations, surgeries, and ER visits in previous 12 months Vitals Screenings to include cognitive, depression, and falls Referrals and appointments  In addition, I have reviewed and discussed with patient certain preventive protocols, quality metrics, and best practice recommendations. A written personalized care plan for preventive services as well as general preventive health recommendations were provided to patient.     Lula Michaux X Rylin Seavey, NP   04/09/2022

## 2022-05-27 ENCOUNTER — Encounter: Payer: Self-pay | Admitting: Adult Health

## 2022-05-27 ENCOUNTER — Non-Acute Institutional Stay: Payer: Medicare Other | Admitting: Adult Health

## 2022-05-27 DIAGNOSIS — N1832 Chronic kidney disease, stage 3b: Secondary | ICD-10-CM

## 2022-05-27 DIAGNOSIS — K219 Gastro-esophageal reflux disease without esophagitis: Secondary | ICD-10-CM | POA: Diagnosis not present

## 2022-05-27 DIAGNOSIS — R1310 Dysphagia, unspecified: Secondary | ICD-10-CM | POA: Diagnosis not present

## 2022-05-27 MED ORDER — CALCIUM/VITAMIN D3/ADULT GUMMY 250-100-500 MG-MG-UNIT PO CHEW: 1.0000 | CHEWABLE_TABLET | Freq: Two times a day (BID) | ORAL | 3 refills | Status: DC

## 2022-05-27 MED ORDER — FAMOTIDINE 10 MG PO TABS: 10.0000 mg | ORAL_TABLET | Freq: Three times a day (TID) | ORAL | 3 refills | Status: DC

## 2022-05-27 MED ORDER — FAMOTIDINE 10 MG PO TABS: 10.0000 mg | ORAL_TABLET | Freq: Every day | ORAL | 3 refills | Status: DC

## 2022-05-27 NOTE — Progress Notes (Signed)
Location:  [facility] Room Number: [place] of Service:  ALF [phone]) Provider:  Durenda Age, DNP, FNP-BC  Patient Care Team: Mast, Man X, NP as PCP - General (Internal Medicine) Jettie Booze, MD as PCP - Cardiology (Cardiology) Wilford Corner, MD as Consulting Physician (Gastroenterology) Marlou Sa Tonna Corner, MD as Consulting Physician (Orthopedic Surgery) Marcial Pacas, MD as Consulting Physician (Neurology) Franchot Gallo, MD as Consulting Physician (Urology) Kary Kos, MD as Consulting Physician (Neurosurgery)  Extended Emergency Contact Information Primary Emergency Contact: Merlinda Frederick Address: [address]          [place] [address] [place] Phone: [phone] Mobile Phone: [phone] Relation: Spouse Secondary Emergency Contact: [place] Mobile Phone: [phone] Relation: Son  Code Status:   DNR  Goals of care: Advanced Directive information    04/03/2022    2:08 PM  Advanced Directives  Does Patient Have a Medical Advance Directive? Yes  Type of Advance Directive Living will;Out of facility DNR (pink MOST or yellow form)  Does patient want to make changes to medical advance directive? No - Patient declined     Chief Complaint  Patient presents with   Acute Visit    Difficulty swallowing calcium and acid reflux    HPI:  Pt is a 82 y.o. female seen today for an acute visit regarding difficulty swallowing calcium and acid reflux. She lives at [facility]. She complained that she has difficulty swallowing th calcium tablet. She is requesting gummy form of calcium. She is currently taking Pantoprazole for GERD. She thinks that Pantoprazole is not helping her GERD. She used to take Pepcid and was help[ing her GERD then. She is requesting to have Pepcid again. Latest GFR 39, ranging in chronic kidney disease 3b.    Past Medical History:  Diagnosis Date    Anemia    Arthritis    "fingers, back, shoulders, hips" (04/10/2016)   Chronic diastolic CHF (congestive heart failure) (HCC)    a. normal EF, LVEDP at [facility] in 6/17 33 >> Lasix started    CKD (chronic kidney disease), stage III (HCC)    Coronary artery disease    a. s/p CABG 2000 (SVG/ free LIMA Y graft to the diagonal and distal LAD, SVG to the OM 1, and SVG to the PDA) . b. Cath 12/19/2014 90% dSVG to RCA s/p 2 overlapping DES. c. LHC 2016, 2017 no intervention except diuresis needed. d. Low risk nuc 03/2016. e. Cath 12/2017 patent LIMA-LAD, SVG-OM, SVG-PDA but occluded SVG to diag.    Depression    with anxious component   GERD (gastroesophageal reflux disease)    History of hiatal hernia    Hyperlipidemia    Hypothyroidism    Obesity    OSA on CPAP    PMR (polymyalgia rheumatica) (HCC)    Pneumonia    "2-3 times" (04/10/2016)   Stable angina    microvascular, improved with Ranexa   Subclavian artery stenosis ([place])    a. L by cath note in 2016.   TIA (transient ischemic attack)    Past Surgical History:  Procedure Laterality Date   BREAST BIOPSY Right 1964   CARDIAC CATHETERIZATION  08   patent grafts, no culprit lesions, EF 65%   CARDIAC CATHETERIZATION N/A 12/19/2014   Procedure: Left Heart Cath and Cors/Grafts Angiography;  Surgeon: Jettie Booze, MD;  Location: [facility];  Service: Cardiovascular;  Laterality: N/A;   CARDIAC CATHETERIZATION N/A  12/19/2014   Procedure: Coronary Stent Intervention;  Surgeon: Jettie Booze, MD;  Location: [facility] [facility];  Service: Cardiovascular;  Laterality: N/A;   CARDIAC CATHETERIZATION N/A 01/01/2015   Procedure: Left Heart Cath and Cors/Grafts Angiography;  Surgeon: Jettie Booze, MD;  Location: [facility] LAB;  Service: Cardiovascular;  Laterality: N/A;   CARDIAC CATHETERIZATION N/A 09/20/2015   Procedure: Left Heart Cath and Cors/Grafts Angiography;  Surgeon: Jettie Booze, MD;  Location: [facility]  LAB;  Service: Cardiovascular;  Laterality: N/A;   CATARACT EXTRACTION W/ INTRAOCULAR LENS  IMPLANT, BILATERAL Bilateral 2011   COLONOSCOPY WITH PROPOFOL N/A 07/27/2016   Procedure: COLONOSCOPY WITH PROPOFOL;  Surgeon: Wilford Corner, MD;  Location: [facility];  Service: Endoscopy;  Laterality: N/A;   CORONARY ARTERY BYPASS GRAFT  2000   ASCVD, multivessel, S./P.   DILATION AND CURETTAGE OF UTERUS  1980s   ESOPHAGOGASTRODUODENOSCOPY (EGD) WITH PROPOFOL N/A 07/27/2016   Procedure: ESOPHAGOGASTRODUODENOSCOPY (EGD) WITH PROPOFOL;  Surgeon: Wilford Corner, MD;  Location: [facility];  Service: Endoscopy;  Laterality: N/A;   FRACTURE SURGERY     LEFT HEART CATH AND CORS/GRAFTS ANGIOGRAPHY N/A 01/12/2018   Procedure: LEFT HEART CATH AND CORS/GRAFTS ANGIOGRAPHY;  Surgeon: Jettie Booze, MD;  Location: [facility];  Service: Cardiovascular;  Laterality: N/A;   PATELLA FRACTURE SURGERY Left 1993    Allergies  Allergen Reactions   Nsaids Other (See Comments)    Due to chronic kidney failure   Butorphanol Other (See Comments)    agitation Constipation Produced a lot of urine, made patient feel crazy   Sulfa Antibiotics Rash   Bisoprolol Fumarate Other (See Comments)    Doesn't remember   Butorphanol Tartrate Other (See Comments)    Produced a lot of urine, made patient feel crazy   Codeine Nausea And Vomiting   Demerol [Meperidine] Nausea And Vomiting   Imipramine     Sweating, facial dysfunction    Meperidine And Related Nausea And Vomiting   Statins     MYALGIAS   Tequin [Gatifloxacin] Other (See Comments)    Caused hypoglycemia Low blood sugar   Brilinta [Ticagrelor] Rash    CAUSES PETECHIAE, PURPURA   Pseudoephedrine Hcl Palpitations   Septra [Sulfamethoxazole-Trimethoprim] Rash   Sulfamethoxazole-Trimethoprim Rash    Outpatient Encounter Medications as of 05/27/2022  Medication Sig   Calcium-Phosphorus-Vitamin D (CALCIUM/VITAMIN D3/ADULT GUMMY) 250-100-500  MG-MG-UNIT CHEW Chew 1 Piece of gum by mouth 2 (two) times daily.   [DISCONTINUED] famotidine (PEPCID) 10 MG tablet Take 1 tablet (10 mg total) by mouth 3 (three) times daily before meals.   acetaminophen (TYLENOL) 500 MG tablet Take 1,000 mg by mouth 2 (two) times daily as needed.   alum & mag hydroxide-simeth (MAALOX PLUS) 400-400-40 MG/5ML suspension Take 10 mLs by mouth as needed for indigestion.   atorvastatin (LIPITOR) 10 MG tablet TAKE 1 TABLET ONCE DAILY.   Camphor-Menthol-Methyl Sal (SALONPAS) 3.03-28-08 % PTCH Apply 1 patch topically 2 (two) times daily.   Cholecalciferol (VITAMIN D) 50 MCG (2000 UT) tablet Take 2,000 Units by mouth daily.   clopidogrel (PLAVIX) 75 MG tablet TAKE 1 TABLET ONCE DAILY.   COENZYME Q-10 PO Take 10 mg by mouth at bedtime.   cyanocobalamin 1000 MCG tablet Take 1,000 mcg by mouth daily.   famotidine (PEPCID) 10 MG tablet Take 1 tablet (10 mg total) by mouth daily.   fluticasone (FLONASE) 50 MCG/ACT nasal spray Place 2 sprays into both nostrils daily as needed for allergies.  isosorbide mononitrate (IMDUR) 60 MG 24 hr tablet Take 2 tablets (120 mg total) by mouth daily.   levocetirizine (XYZAL) 5 MG tablet TAKE 1 TABLET ONCE DAILY IN THE EVENING.   levothyroxine (SYNTHROID) 75 MCG tablet TAKE 1 TABLET ONCE DAILY ON EMPTY STOMACH.   Melatonin 1 MG TABS Take by mouth at bedtime.   memantine (NAMENDA TITRATION PACK) tablet pack Take 5-10 mg by mouth 2 (two) times daily.   Menthol, Topical Analgesic, (BIOFREEZE) 10 % LIQD Apply topically as needed.   methocarbamol (ROBAXIN) 500 MG tablet Take 500 mg by mouth every 6 (six) hours as needed for muscle spasms (Back Pain).   metoprolol succinate (TOPROL-XL) 25 MG 24 hr tablet Take 25 mg by mouth daily. Take with or immediately following a meal.   MYRBETRIQ 50 MG TB24 tablet TAKE 1 TABLET BY MOUTH DAILY.   nitroGLYCERIN (NITROSTAT) 0.4 MG SL tablet Place 0.4 mg under the tongue every 5 (five) minutes as needed for chest  pain. X 3 doses   nystatin (MYCOSTATIN/NYSTOP) powder Apply 1 application topically. apply underneath abd/breast folds As Needed   predniSONE (DELTASONE) 5 MG tablet Take 5 mg by mouth daily.   PSYLLIUM HUSK PO Take 0.4 g by mouth daily. May keep at bedside   ranolazine (RANEXA) 1000 MG SR tablet TAKE 1 TABLET BY MOUTH TWICE DAILY.   tiZANidine (ZANAFLEX) 2 MG tablet Take 1 tablet (2 mg total) by mouth every 8 (eight) hours as needed for muscle spasms.   venlafaxine (EFFEXOR) 75 MG tablet Take 150 mg by mouth. 2 caps= 150 mg; oral  Once A Day   zinc oxide 20 % ointment Apply 1 application topically as needed for irritation.   [DISCONTINUED] calcium carbonate (OS-CAL) 600 MG TABS tablet Take 600 mg by mouth 2 (two) times daily with a meal.   [DISCONTINUED] famotidine (PEPCID) 10 MG tablet Take 1 tablet (10 mg total) by mouth 3 (three) times daily before meals.   [DISCONTINUED] pantoprazole (PROTONIX) 40 MG tablet Take 1 tablet (40 mg total) by mouth daily.   No facility-administered encounter medications on file as of 05/27/2022.    Review of Systems  Constitutional:  Negative for appetite change, chills, fatigue and fever.  HENT:  Negative for congestion, hearing loss, rhinorrhea and sore throat.   Eyes: Negative.   Respiratory:  Negative for cough, shortness of breath and wheezing.   Cardiovascular:  Negative for chest pain, palpitations and leg swelling.  Gastrointestinal:  Negative for abdominal pain, constipation, diarrhea, nausea and vomiting.  Genitourinary:  Negative for dysuria.  Musculoskeletal:  Negative for arthralgias, back pain and myalgias.  Skin:  Negative for color change, rash and wound.  Neurological:  Negative for dizziness, weakness and headaches.  Psychiatric/Behavioral:  Negative for behavioral problems. The patient is not nervous/anxious.        Immunization History  Administered Date(s) Administered   Fluad Quad(high Dose 65+) 01/12/2022   HPV Bivalent  11/24/2013, 01/17/2016   Influenza Whole 12/23/2017   Influenza, High Dose Seasonal PF 12/24/2018, 12/24/2018   Influenza-Unspecified 01/10/2013, 12/27/2014, 12/30/2016, 01/02/2020, 01/09/2021   Moderna SARS-COV2 Booster Vaccination 08/08/2021   Moderna Sars-Covid-2 Vaccination 03/25/2019, 04/22/2019, 01/30/2020, 08/20/2020, 12/10/2020   Pfizer Covid-19 Vaccine Bivalent Booster 1yr & up 01/22/2022   Pneumococcal Conjugate-13 04/21/2013   Pneumococcal Polysaccharide-23 02/27/1993, 03/23/2009   Tdap 04/20/2012   Zoster Recombinat (Shingrix) 03/29/2021, 06/09/2021   Zoster, Live 11/10/2010   Pertinent  Health Maintenance Due  Topic Date Due   INFLUENZA VACCINE  Completed   DEXA SCAN  Completed      10/15/2018    8:00 AM 02/10/2019    9:44 AM 03/27/2021    9:56 PM 03/31/2022   10:18 AM 04/03/2022    2:08 PM  Fall Risk  Falls in the past year?  0  0 0  Was there an injury with Fall?    0 0  Fall Risk Category Calculator    0 0  Fall Risk Category (Retired)    Low Low  (RETIRED) Patient Fall Risk Level Moderate fall risk  Moderate fall risk Low fall risk Low fall risk  Patient at Risk for Falls Due to    No Fall Risks No Fall Risks  Fall risk Follow up    Falls evaluation completed Falls evaluation completed     Vitals:   05/27/22 1131  BP: (!) 142/70  Pulse: 69  Resp: 20  Temp: (!) 97.5 F (36.4 C)  SpO2: 95%  Weight: 196 lb 12.8 oz (89.3 kg)  Height: '4\' 10"'$  (1.473 m)   Body mass index is 41.13 kg/m.  Physical Exam Constitutional:      Appearance: She is obese.     Comments: Morbidly obese  HENT:     Head: Normocephalic and atraumatic.     Nose: Nose normal.     Mouth/Throat:     Mouth: Mucous membranes are moist.  Eyes:     Conjunctiva/sclera: Conjunctivae normal.  Cardiovascular:     Rate and Rhythm: Normal rate and regular rhythm.  Pulmonary:     Effort: Pulmonary effort is normal.     Breath sounds: Normal breath sounds.  Abdominal:     General: Bowel  sounds are normal.     Palpations: Abdomen is soft.  Musculoskeletal:        General: Normal range of motion.     Cervical back: Normal range of motion.  Skin:    General: Skin is warm and dry.  Neurological:     General: No focal deficit present.     Mental Status: She is alert and oriented to person, place, and time.  Psychiatric:        Mood and Affect: Mood normal.        Behavior: Behavior normal.        Thought Content: Thought content normal.        Judgment: Judgment normal.       Labs reviewed: Recent Labs    06/06/21 0000 12/05/21 0000  NA 138 140  K 4.5 4.4  CL 107 109*  CO2 23* 24*  BUN 25* 29*  CREATININE 1.0 1.4*  CALCIUM 9.0 8.7   Recent Labs    06/06/21 0000 12/05/21 0000  AST 12* 11*  ALT 11 7  ALKPHOS 52 49  ALBUMIN 3.7 3.9   Recent Labs    06/06/21 0000 12/05/21 0000  WBC 10.8 12.2  NEUTROABS 7,711.00 8,747.00  HGB 10.3* 10.3*  HCT 32* 32*  PLT 269 268   Lab Results  Component Value Date   TSH 3.56 04/01/2021   Lab Results  Component Value Date   HGBA1C 5.1 02/14/2019   Lab Results  Component Value Date   CHOL 151 08/24/2020   HDL 57 08/24/2020   LDLCALC 70 08/24/2020   TRIG 162 (A) 08/24/2020   CHOLHDL 2.1 06/07/2018    Significant Diagnostic Results in last 30 days:  No results found.  Assessment/Plan  1. Gastroesophageal reflux disease without esophagitis -  discontinue Pantoprazole -  famotidine (PEPCID) 10 MG tablet; Take 1 tablet (10 mg total) by mouth daily.  Dispense: 30 tablet; Refill: 3 -  requested to have medication at bedside -  MMSE 29/30, normal -  staff to evaluate for patient's ability to self medicate  2. Stage 3b chronic kidney disease [facility]) Lab Results  Component Value Date   NA 140 12/05/2021   K 4.4 12/05/2021   CO2 24 (A) 12/05/2021   GLUCOSE 85 10/15/2018   BUN 29 (A) 12/05/2021   CREATININE 1.4 (A) 12/05/2021   CALCIUM 8.7 12/05/2021   EGFR 39 12/05/2021   GFRNONAA 44 08/24/2020     -  re-check BMP  3. Dysphagia, unspecified type -  discontinue Calcium tablet -  start Calcium-Phosphorus-Vitamin D (CALCIUM/VITAMIN D3/ADULT GUMMY) 250-100-500 MG-MG-UNIT CHEW; Chew 1 Piece of gum by mouth 2 (two) times daily.  Dispense: 60 tablet; Refill: 3 -  aspiration precaution     Family/ staff Communication: Discussed plan of care with resident and charge nurse.  Labs/tests ordered:  BMP    Durenda Age, DNP, MSN, FNP-BC [facility] [phone] (Monday-Friday 8:00 a.m. - 5:00 p.m.) [phone] (after hours)

## 2022-05-28 LAB — BASIC METABOLIC PANEL
BUN: 40 — AB (ref 4–21)
CO2: 23 — AB (ref 13–22)
Chloride: 107 (ref 99–108)
Creatinine: 1.6 — AB (ref 0.5–1.1)
Glucose: 85
Potassium: 4.2 mEq/L (ref 3.5–5.1)
Sodium: 140 (ref 137–147)

## 2022-05-28 LAB — COMPREHENSIVE METABOLIC PANEL
Calcium: 8.3 — AB (ref 8.7–10.7)
eGFR: 31

## 2022-06-03 ENCOUNTER — Other Ambulatory Visit (HOSPITAL_COMMUNITY): Payer: Self-pay | Admitting: Nurse Practitioner

## 2022-06-03 DIAGNOSIS — R1310 Dysphagia, unspecified: Secondary | ICD-10-CM

## 2022-06-05 ENCOUNTER — Other Ambulatory Visit (HOSPITAL_COMMUNITY): Payer: Self-pay | Admitting: Nurse Practitioner

## 2022-06-05 ENCOUNTER — Ambulatory Visit (HOSPITAL_COMMUNITY)
Admission: RE | Admit: 2022-06-05 | Discharge: 2022-06-05 | Disposition: A | Discharge status: Home / Self Care | Payer: Medicare Other | Source: Ambulatory Visit | Attending: Nurse Practitioner | Admitting: Nurse Practitioner

## 2022-06-05 DIAGNOSIS — R1310 Dysphagia, unspecified: Secondary | ICD-10-CM | POA: Insufficient documentation

## 2022-06-16 ENCOUNTER — Encounter: Payer: Self-pay | Admitting: Family Medicine

## 2022-06-16 NOTE — Progress Notes (Signed)
This encounter was created in error - please disregard.

## 2022-06-18 ENCOUNTER — Non-Acute Institutional Stay: Payer: Medicare Other | Admitting: Family Medicine

## 2022-06-18 DIAGNOSIS — I10 Essential (primary) hypertension: Secondary | ICD-10-CM

## 2022-06-18 DIAGNOSIS — I2510 Atherosclerotic heart disease of native coronary artery without angina pectoris: Secondary | ICD-10-CM

## 2022-06-18 DIAGNOSIS — F339 Major depressive disorder, recurrent, unspecified: Secondary | ICD-10-CM | POA: Diagnosis not present

## 2022-06-18 DIAGNOSIS — M353 Polymyalgia rheumatica: Secondary | ICD-10-CM

## 2022-06-18 DIAGNOSIS — R2681 Unsteadiness on feet: Secondary | ICD-10-CM

## 2022-06-18 DIAGNOSIS — N1831 Chronic kidney disease, stage 3a: Secondary | ICD-10-CM | POA: Diagnosis not present

## 2022-06-18 DIAGNOSIS — D631 Anemia in chronic kidney disease: Secondary | ICD-10-CM

## 2022-06-18 NOTE — Progress Notes (Signed)
Provider:  Alain Honey, MD Location:      Place of Service:     PCP: Mast, Man X, NP Patient Care Team: Mast, Man X, NP as PCP - General (Internal Medicine) Jettie Booze, MD as PCP - Cardiology (Cardiology) Wilford Corner, MD as Consulting Physician (Gastroenterology) Marlou Sa, Tonna Corner, MD as Consulting Physician (Orthopedic Surgery) Marcial Pacas, MD as Consulting Physician (Neurology) Franchot Gallo, MD as Consulting Physician (Urology) Kary Kos, MD as Consulting Physician (Neurosurgery)  Extended Emergency Contact Information Primary Emergency Contact: Merlinda Frederick Address: [address]          [place] [address] [place] Phone: [phone] Mobile Phone: [phone] Relation: Spouse Secondary Emergency Contact: [place] Mobile Phone: [phone] Relation: Son  Code Status:  Goals of Care: Advanced Directive information    06/16/2022    3:14 PM  Advanced Directives  Does Patient Have a Medical Advance Directive? Yes  Type of Paramedic of [place];Out of facility DNR (pink MOST or yellow form);Living will  Does patient want to make changes to medical advance directive? No - Patient declined  Copy of [place] in Chart? Yes - validated most recent copy scanned in chart (See row information)      No chief complaint on file.   HPI: Patient is a 82 y.o. female seen today for medical management of chronic problems including polymyalgia rheumatica, obstructive sleep apnea, hypertension, gait abnormality, coronary artery disease, anemia, and depression. I visited with her in her room today.  She was working on taxes and discussing returns with her son.  This speaks well of her cognitive abilities.  Most recent MMSE from 1 year ago was 29/30.  She is on memantine. She denies any problems today.  Endorses good appetite resting well and no problems with bowels or bladder.   When questioned specifically about chest pain she replied in the negative.  She does Take isosorbide as well as ranolazine and Plavix. Doing well from depression continues with Venlafaxine For her polymyalgia she continues with prednisone 5 mg.  This failed gradual dose reduction. She also uses CPAP for her obstructive sleep apnea  Past Medical History:  Diagnosis Date   Anemia    Arthritis    "fingers, back, shoulders, hips" (04/10/2016)   Chronic diastolic CHF (congestive heart failure) (HCC)    a. normal EF, LVEDP at [facility] in 6/17 33 >> Lasix started    CKD (chronic kidney disease), stage III (HCC)    Coronary artery disease    a. s/p CABG 2000 (SVG/ free LIMA Y graft to the diagonal and distal LAD, SVG to the OM 1, and SVG to the PDA) . b. Cath 12/19/2014 90% dSVG to RCA s/p 2 overlapping DES. c. LHC 2016, 2017 no intervention except diuresis needed. d. Low risk nuc 03/2016. e. Cath 12/2017 patent LIMA-LAD, SVG-OM, SVG-PDA but occluded SVG to diag.    Depression    with anxious component   GERD (gastroesophageal reflux disease)    History of hiatal hernia    Hyperlipidemia    Hypothyroidism    Obesity    OSA on CPAP    PMR (polymyalgia rheumatica) (HCC)    Pneumonia    "2-3 times" (04/10/2016)   Stable angina    microvascular, improved with Ranexa   Subclavian artery stenosis ([place])    a. L by cath note in 2016.   TIA (transient ischemic attack)    Past Surgical History:  Procedure  Laterality Date   BREAST BIOPSY Right 1964   CARDIAC CATHETERIZATION  08   patent grafts, no culprit lesions, EF 65%   CARDIAC CATHETERIZATION N/A 12/19/2014   Procedure: Left Heart Cath and Cors/Grafts Angiography;  Surgeon: Jettie Booze, MD;  Location: [place] [facility];  Service: Cardiovascular;  Laterality: N/A;   CARDIAC CATHETERIZATION N/A 12/19/2014   Procedure: Coronary Stent Intervention;  Surgeon: Jettie Booze, MD;  Location: [place] [facility];  Service: Cardiovascular;   Laterality: N/A;   CARDIAC CATHETERIZATION N/A 01/01/2015   Procedure: Left Heart Cath and Cors/Grafts Angiography;  Surgeon: Jettie Booze, MD;  Location: [place] [facility];  Service: Cardiovascular;  Laterality: N/A;   CARDIAC CATHETERIZATION N/A 09/20/2015   Procedure: Left Heart Cath and Cors/Grafts Angiography;  Surgeon: Jettie Booze, MD;  Location: [place] [facility];  Service: Cardiovascular;  Laterality: N/A;   CATARACT EXTRACTION W/ INTRAOCULAR LENS  IMPLANT, BILATERAL Bilateral 2011   COLONOSCOPY WITH PROPOFOL N/A 07/27/2016   Procedure: COLONOSCOPY WITH PROPOFOL;  Surgeon: Wilford Corner, MD;  Location: [place];  Service: Endoscopy;  Laterality: N/A;   CORONARY ARTERY BYPASS GRAFT  2000   ASCVD, multivessel, S./P.   DILATION AND CURETTAGE OF UTERUS  1980s   ESOPHAGOGASTRODUODENOSCOPY (EGD) WITH PROPOFOL N/A 07/27/2016   Procedure: ESOPHAGOGASTRODUODENOSCOPY (EGD) WITH PROPOFOL;  Surgeon: Wilford Corner, MD;  Location: [facility];  Service: Endoscopy;  Laterality: N/A;   FRACTURE SURGERY     LEFT HEART CATH AND CORS/GRAFTS ANGIOGRAPHY N/A 01/12/2018   Procedure: LEFT HEART CATH AND CORS/GRAFTS ANGIOGRAPHY;  Surgeon: Jettie Booze, MD;  Location: [place];  Service: Cardiovascular;  Laterality: N/A;   PATELLA FRACTURE SURGERY Left 1993    reports that she quit smoking about 52 years ago. Her smoking use included cigarettes. She has a 3.50 pack-year smoking history. She has never used smokeless tobacco. She reports that she does not drink alcohol and does not use drugs. Social History   Socioeconomic History   Marital status: Married    Spouse name: Not on file   Number of children: 2   Years of education: College   Highest education level: Not on file  Occupational History   Occupation: Retired Cytogeneticist  Tobacco Use   Smoking status: Former    Packs/day: 0.50    Years: 7.00    Additional pack years: 0.00    Total pack years:  3.50    Types: Cigarettes    Quit date: 08/21/1969    Years since quitting: 52.8   Smokeless tobacco: Never  Vaping Use   Vaping Use: Never used  Substance and Sexual Activity   Alcohol use: No    Alcohol/week: 0.0 standard drinks of alcohol    Comment: rare   Drug use: No   Sexual activity: Never  Other Topics Concern   Not on file  Social History Narrative   Social History     Social History Narrative       Lives in [place] with husband.   Diet: Regular   Do you drink/eat things with caffeine? 1 cup caffeine per day.  Not much coffee   Marital status: Married                           What year were you married? 1964   Do you live in a house, apartment, assisted living, condo, trailer, etc)? Here at [facility]   Is  it one or more stories?   How many persons live in your home? 2   Do you have any pets in your home? No   Current or past profession: Engineer, production (RN)   Do you exercise? I did                                                Type & how often: Swimming, Tai Chi, exercise classes   Do you have a living will? yes   Do you have a DNR Form? Yes   Do you have a POA/HPOA forms?             Social Determinants of Health   Financial Resource Strain: Low Risk  (12/07/2017)   Overall Financial Resource Strain (CARDIA)    Difficulty of Paying Living Expenses: Not hard at all  Food Insecurity: No Food Insecurity (12/07/2017)   Hunger Vital Sign    Worried About Running Out of Food in the Last Year: Never true    Ran Out of Food in the Last Year: Never true  Transportation Needs: No Transportation Needs (12/07/2017)   PRAPARE - Hydrologist (Medical): No    Lack of Transportation (Non-Medical): No  Physical Activity: Inactive (12/07/2017)   Exercise Vital Sign    Days of Exercise per Week: 0 days    Minutes of Exercise per Session: 0 min  Stress: Stress Concern Present (12/07/2017)   [place]    Feeling of Stress : To some extent  Social Connections: Somewhat Isolated (12/07/2017)   Social Connection and Isolation Panel [NHANES]    Frequency of Communication with Friends and Family: More than three times a week    Frequency of Social Gatherings with Friends and Family: More than three times a week    Attends Religious Services: Never    Marine scientist or Organizations: No    Attends Archivist Meetings: Never    Marital Status: Married  Human resources officer Violence: Not At Risk (12/07/2017)   Humiliation, Afraid, Rape, and Kick questionnaire    Fear of Current or Ex-Partner: No    Emotionally Abused: No    Physically Abused: No    Sexually Abused: No    Functional Status Survey:    Family History  Problem Relation Age of Onset   Heart disease Mother    Heart attack Mother    Heart disease Father    Heart attack Father    Diabetes Brother    Pulmonary embolism Brother    Stroke Paternal Grandmother    Hypertension Neg Hx     Health Maintenance  Topic Date Due   COVID-19 Vaccine (8 - 2023-24 season) 03/19/2022   DTaP/Tdap/Td (2 - Td or Tdap) 04/20/2022   Medicare Annual Wellness (AWV)  04/04/2023   Pneumonia Vaccine 95+ Years old  Completed   INFLUENZA VACCINE  Completed   DEXA SCAN  Completed   Zoster Vaccines- Shingrix  Completed   HPV VACCINES  Aged Out    Allergies  Allergen Reactions   Nsaids Other (See Comments)    Due to chronic kidney failure   Butorphanol Other (See Comments)    agitation Constipation Produced a lot of urine, made patient feel crazy   Sulfa Antibiotics Rash   Bisoprolol  Fumarate Other (See Comments)    Doesn't remember   Butorphanol Tartrate Other (See Comments)    Produced a lot of urine, made patient feel crazy   Codeine Nausea And Vomiting   Demerol [Meperidine] Nausea And Vomiting   Imipramine     Sweating, facial dysfunction    Meperidine And Related Nausea And Vomiting   Statins      MYALGIAS   Tequin [Gatifloxacin] Other (See Comments)    Caused hypoglycemia Low blood sugar   Brilinta [Ticagrelor] Rash    CAUSES PETECHIAE, PURPURA   Pseudoephedrine Hcl Palpitations   Septra [Sulfamethoxazole-Trimethoprim] Rash   Sulfamethoxazole-Trimethoprim Rash    Outpatient Encounter Medications as of 06/18/2022  Medication Sig   acetaminophen (TYLENOL) 500 MG tablet Take 1,000 mg by mouth 2 (two) times daily as needed.   alum & mag hydroxide-simeth (MAALOX PLUS) 400-400-40 MG/5ML suspension Take 10 mLs by mouth as needed for indigestion.   atorvastatin (LIPITOR) 10 MG tablet TAKE 1 TABLET ONCE DAILY.   Calcium-Phosphorus-Vitamin D (CALCIUM/VITAMIN D3/ADULT GUMMY) 250-100-500 MG-MG-UNIT CHEW Chew 1 Piece of gum by mouth 2 (two) times daily.   Cholecalciferol (VITAMIN D) 50 MCG (2000 UT) tablet Take 2,000 Units by mouth daily.   clopidogrel (PLAVIX) 75 MG tablet TAKE 1 TABLET ONCE DAILY.   COENZYME Q-10 PO Take 10 mg by mouth at bedtime.   cyanocobalamin 1000 MCG tablet Take 1,000 mcg by mouth daily.   famotidine (PEPCID) 10 MG tablet Take 1 tablet (10 mg total) by mouth daily.   fluticasone (FLONASE) 50 MCG/ACT nasal spray Place 2 sprays into both nostrils daily as needed for allergies.    furosemide (LASIX) 20 MG tablet Take 20 mg by mouth daily.   isosorbide mononitrate (IMDUR) 60 MG 24 hr tablet Take 2 tablets (120 mg total) by mouth daily.   ketoconazole (NIZORAL) 2 % shampoo Apply 1 Application topically 2 (two) times a week. Tuesday and Friday   levocetirizine (XYZAL) 5 MG tablet TAKE 1 TABLET ONCE DAILY IN THE EVENING.   levothyroxine (SYNTHROID) 75 MCG tablet TAKE 1 TABLET ONCE DAILY ON EMPTY STOMACH.   melatonin 1 MG TABS tablet Take 1 mg by mouth at bedtime.   memantine (NAMENDA) 10 MG tablet Take 10 mg by mouth 2 (two) times daily.   Menthol, Topical Analgesic, (BIOFREEZE) 10 % LIQD Apply topically as needed.   methocarbamol (ROBAXIN) 750 MG tablet Take 750 mg by  mouth every 6 (six) hours as needed for muscle spasms.   metoprolol succinate (TOPROL-XL) 25 MG 24 hr tablet Take 25 mg by mouth daily. Take with or immediately following a meal.   MYRBETRIQ 50 MG TB24 tablet TAKE 1 TABLET BY MOUTH DAILY.   nitroGLYCERIN (NITROSTAT) 0.4 MG SL tablet Place 0.4 mg under the tongue every 5 (five) minutes as needed for chest pain. X 3 doses   nystatin (MYCOSTATIN/NYSTOP) powder Apply 1 application topically. apply underneath abd/breast folds As Needed   potassium chloride (MICRO-K) 10 MEQ CR capsule Take 20 mEq by mouth 2 (two) times daily.   predniSONE (DELTASONE) 5 MG tablet Take 5 mg by mouth daily.   PSYLLIUM HUSK PO Take 0.4 g by mouth daily. May keep at bedside   ranolazine (RANEXA) 1000 MG SR tablet TAKE 1 TABLET BY MOUTH TWICE DAILY.   venlafaxine (EFFEXOR) 75 MG tablet Take 150 mg by mouth. 2 caps= 150 mg; oral  Once A Day   zinc oxide 20 % ointment Apply 1 application topically as needed for irritation.  No facility-administered encounter medications on file as of 06/18/2022.    Review of Systems  Constitutional: Negative.   HENT: Negative.    Cardiovascular:  Positive for leg swelling.  Genitourinary:  Positive for frequency.  Musculoskeletal:  Positive for arthralgias and gait problem.  Psychiatric/Behavioral: Negative.    All other systems reviewed and are negative.   There were no vitals filed for this visit. There is no height or weight on file to calculate BMI. Physical Exam Vitals and nursing note reviewed.  Constitutional:      Appearance: Normal appearance.  HENT:     Mouth/Throat:     Mouth: Mucous membranes are moist.     Pharynx: Oropharynx is clear.  Eyes:     Extraocular Movements: Extraocular movements intact.     Pupils: Pupils are equal, round, and reactive to light.  Cardiovascular:     Rate and Rhythm: Normal rate.  Pulmonary:     Effort: Pulmonary effort is normal.     Breath sounds: Normal breath sounds.   Musculoskeletal:     Comments: Uses walker as aid for ambulation.  No reported falls  Skin:    General: Skin is warm and dry.  Neurological:     General: No focal deficit present.     Mental Status: She is alert and oriented to person, place, and time.  Psychiatric:        Mood and Affect: Mood normal.        Behavior: Behavior normal.        Thought Content: Thought content normal.     Labs reviewed: Basic Metabolic Panel: Recent Labs    12/05/21 0000 05/28/22 0000  NA 140 140  K 4.4 4.2  CL 109* 107  CO2 24* 23*  BUN 29* 40*  CREATININE 1.4* 1.6*  CALCIUM 8.7 8.3*   Liver Function Tests: Recent Labs    12/05/21 0000  AST 11*  ALT 7  ALKPHOS 49  ALBUMIN 3.9   No results for input(s): "LIPASE", "AMYLASE" in the last 8760 hours. No results for input(s): "AMMONIA" in the last 8760 hours. CBC: Recent Labs    12/05/21 0000  WBC 12.2  NEUTROABS 8,747.00  HGB 10.3*  HCT 32*  PLT 268   Cardiac Enzymes: No results for input(s): "CKTOTAL", "CKMB", "CKMBINDEX", "TROPONINI" in the last 8760 hours. BNP: Invalid input(s): "POCBNP" Lab Results  Component Value Date   HGBA1C 5.1 02/14/2019   Lab Results  Component Value Date   TSH 3.56 04/01/2021   Lab Results  Component Value Date   [id number] 08/24/2020   Lab Results  Component Value Date   FOLATE 816 08/04/2019   Lab Results  Component Value Date   IRON 47 05/23/2021   TIBC 282 05/23/2021   FERRITIN 25 05/23/2021    Imaging and Procedures obtained prior to SNF admission: DG ESOPHAGUS W DOUBLE CM (HD)  Result Date: 06/05/2022 CLINICAL DATA:  82 year old female with history of dysphagia with sensation of "food getting stuck in my chest." EXAM: ESOPHAGUS/BARIUM SWALLOW/TABLET STUDY TECHNIQUE: Combined double and single contrast examination was performed using effervescent crystals, high-density barium, and thin liquid barium. This exam was performed by Brynda Greathouse PA-C, and was supervised and  interpreted by Logan Bores, MD. FLUOROSCOPY: Radiation Exposure Index (as provided by the fluoroscopic device): 47.0 mGy Kerma COMPARISON:  Esophagram 06/12/2016 FINDINGS: The examination was limited by decreased patient mobility and body habitus. The patient could not tolerate prone positioning. Swallowing: Appears normal. No vestibular  penetration or aspiration seen. Pharynx: Cricopharyngeal bar without obstruction of barium passage. Esophagus: Prominent transient transverse bands in the mid and lower thoracic esophagus (feline esophagus). No evidence of a mass or stricture. Esophageal motility: Suboptimally evaluated due to limitations with patient positioning. Tertiary contractions with poor propagation of the barium bolus. Hiatal Hernia: Small sliding hiatal hernia. Gastroesophageal reflux: None visualized on this limited study. Ingested 13 mm barium tablet: Passed normally. IMPRESSION: 1. Small sliding hiatal hernia. 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 3. Esophageal dysmotility. Electronically Signed   By: Logan Bores M.D.   On: 06/05/2022 12:04    Assessment/Plan 1. Anemia due to stage 3a chronic kidney disease ([place]) Most recent hemoglobin 10.3 with normal iron, folate, and B12 studies  2. Coronary artery disease involving native coronary artery of native heart without angina pectoris Denies chest pain.  Symptoms well-controlled on  3. Depression, recurrent ([place]) She tells me she is happy living here in assisted living.  Continue with Effexor  4. Essential hypertension Blood pressure is good at 116/61 on metoprolol 25 mg  5. Gait instability Uses walker with no recent falls  6. Polymyalgia rheumatica ([place])  Ccontrolled with minimal dose prednisone 5 mg    Family/ staff Communication:   Labs/tests ordered:  Lillette Boxer. Sabra Heck, [place] [address] [place], [place] Office [phone]

## 2022-06-18 NOTE — Progress Notes (Deleted)
.  smm 

## 2022-07-07 ENCOUNTER — Encounter: Payer: Self-pay | Admitting: Nurse Practitioner

## 2022-07-07 ENCOUNTER — Non-Acute Institutional Stay: Payer: Medicare Other | Admitting: Nurse Practitioner

## 2022-07-07 DIAGNOSIS — M159 Polyosteoarthritis, unspecified: Secondary | ICD-10-CM

## 2022-07-07 DIAGNOSIS — F339 Major depressive disorder, recurrent, unspecified: Secondary | ICD-10-CM

## 2022-07-07 DIAGNOSIS — M353 Polymyalgia rheumatica: Secondary | ICD-10-CM

## 2022-07-07 DIAGNOSIS — E039 Hypothyroidism, unspecified: Secondary | ICD-10-CM

## 2022-07-07 DIAGNOSIS — K219 Gastro-esophageal reflux disease without esophagitis: Secondary | ICD-10-CM

## 2022-07-07 DIAGNOSIS — I10 Essential (primary) hypertension: Secondary | ICD-10-CM | POA: Diagnosis not present

## 2022-07-07 DIAGNOSIS — R413 Other amnesia: Secondary | ICD-10-CM

## 2022-07-07 DIAGNOSIS — E785 Hyperlipidemia, unspecified: Secondary | ICD-10-CM

## 2022-07-07 DIAGNOSIS — N1831 Chronic kidney disease, stage 3a: Secondary | ICD-10-CM

## 2022-07-07 DIAGNOSIS — R350 Frequency of micturition: Secondary | ICD-10-CM

## 2022-07-07 DIAGNOSIS — R2681 Unsteadiness on feet: Secondary | ICD-10-CM

## 2022-07-07 DIAGNOSIS — M8000XA Age-related osteoporosis with current pathological fracture, unspecified site, initial encounter for fracture: Secondary | ICD-10-CM

## 2022-07-07 DIAGNOSIS — I2510 Atherosclerotic heart disease of native coronary artery without angina pectoris: Secondary | ICD-10-CM

## 2022-07-07 DIAGNOSIS — D631 Anemia in chronic kidney disease: Secondary | ICD-10-CM

## 2022-07-07 NOTE — Assessment & Plan Note (Signed)
Blood pressure is controlled,  on Metoprolol, Losartan  

## 2022-07-07 NOTE — Assessment & Plan Note (Signed)
MMSE 29/30 06/06/21, 29/30 05/27/22, 07/02/21 CT head w/o CM, No acute abnormality no change from earlier this year. Moderate atrophy and moderate chronic microvascular ischemic change in the white matter. On Memantine.

## 2022-07-07 NOTE — Assessment & Plan Note (Signed)
takes Ca, Vit D. 04/29/21 Dr. Syed dc Fosamax.  05/07/21 DEXA t score -1.273 

## 2022-07-07 NOTE — Assessment & Plan Note (Signed)
uses walker for ambulation, w/c to go further.  

## 2022-07-07 NOTE — Progress Notes (Unsigned)
Location:  [facility] Room Number: 911A Place of Service:  ALF [phone]) Provider:  Ryleeann Urquiza X, NP  Patient Care Team: Jessenia Filippone X, NP as PCP - General (Internal Medicine) Corky Crafts, MD as PCP - Cardiology (Cardiology) Charlott Rakes, MD as Consulting Physician (Gastroenterology) August Saucer, Corrie Mckusick, MD as Consulting Physician (Orthopedic Surgery) Levert Feinstein, MD as Consulting Physician (Neurology) Marcine Matar, MD as Consulting Physician (Urology) Donalee Citrin, MD as Consulting Physician (Neurosurgery)  Extended Emergency Contact Information Primary Emergency Contact: Darlin Priestly Address: [address]          [place] [address] [place] Home Phone: [phone] Mobile Phone: [phone] Relation: Spouse Secondary Emergency Contact: Bergeson,NATHAN Mobile Phone: [phone] Relation: Son  Code Status:  DNR  Goals of care: Advanced Directive information    07/07/2022    9:53 AM  Advanced Directives  Does Patient Have a Medical Advance Directive? Yes  Type of Advance Directive Living will;Healthcare Power of [place];Out of facility DNR (pink MOST or yellow form)  Does patient want to make changes to medical advance directive? No - Patient declined  Copy of Healthcare Power of Attorney in Chart? Yes - validated most recent copy scanned in chart (See row information)  Pre-existing out of facility DNR order (yellow form or pink MOST form) Pink MOST form placed in chart (order not valid for inpatient use);Yellow form placed in chart (order not valid for inpatient use)     Chief Complaint  Patient presents with   Acute Visit    Leg Pain    HPI:  Pt is a 82 y.o. female seen today for an acute visit for flare up muscle aches, right thigh pain when walking, left thigh pain when pressed with her hand since Dr. Kathi Ludwig decreased her Prednisone, able to ambulate with walker, not new, responded to higher dose of  Prednisone in the past.   PMR, takes Prednisone  qd(failed GDR) HTN, on Metoprolol, Losartan             Memory loss: MMSE 29/30 06/06/21, 29/38 05/27/22, 07/02/21 CT head w/o CM, No acute abnormality no change from earlier this year. Moderate atrophy and moderate chronic microvascular ischemic change in the white matter. On Memantine.               Gait abnormality, uses walker for ambulation, w/c to go further.  CKD IIIa Bun/creat 40/1.6 05/28/22             CAD, on Isosorbide and Ranolazine, Plavix. LDL 70 08/24/20             Hypothyroidism, on Levothyroxine, TSH 3.03 12/04/21             Anemia, on B12, Iron 47 05/22/21, Hgb 10.3 12/05/21             Urinary frequency and occasional incontinent of urine, on Myrbetriq.             GERD, stable, prn Maalox, 06/05/22 esophogram barium swallow study, dysmotility             Depression, stable, on Venlafaxine             OA, takes Tylenol, Methocarbamol prn, 06/18/22 ortho X-ay R+L hip             OP, takes Ca, Vit D. 04/29/21 Dr. Kathi Ludwig dc Fosamax.  05/07/21 DEXA t score -1.273             Vit  B12 deficiency, takes Vit B12, Vit B12 162 08/03/19>> Vit B12 869 08/24/20             Hyperlipidemia, taking Atorvastatin.              OSA, uses CPAP   Past Medical History:  Diagnosis Date   Anemia    Arthritis    "fingers, back, shoulders, hips" (04/10/2016)   Chronic diastolic CHF (congestive heart failure)    a. normal EF, LVEDP at [facility] in 6/17 33 >> Lasix started    CKD (chronic kidney disease), stage III    Coronary artery disease    a. s/p CABG 2000 (SVG/ free LIMA Y graft to the diagonal and distal LAD, SVG to the OM 1, and SVG to the PDA) . b. Cath 12/19/2014 90% dSVG to RCA s/p 2 overlapping DES. c. LHC 2016, 2017 no intervention except diuresis needed. d. Low risk nuc 03/2016. e. Cath 12/2017 patent LIMA-LAD, SVG-OM, SVG-PDA but occluded SVG to diag.    Depression    with anxious component   GERD (gastroesophageal reflux disease)    History of hiatal  hernia    Hyperlipidemia    Hypothyroidism    Obesity    OSA on CPAP    PMR (polymyalgia rheumatica)    Pneumonia    "2-3 times" (04/10/2016)   Stable angina    microvascular, improved with Ranexa   Subclavian artery stenosis    a. L by cath note in 2016.   TIA (transient ischemic attack)    Past Surgical History:  Procedure Laterality Date   BREAST BIOPSY Right 1964   CARDIAC CATHETERIZATION  08   patent grafts, no culprit lesions, EF 65%   CARDIAC CATHETERIZATION N/A 12/19/2014   Procedure: Left Heart Cath and Cors/Grafts Angiography;  Surgeon: Corky Crafts, MD;  Location: [facility] INVASIVE CV LAB;  Service: Cardiovascular;  Laterality: N/A;   CARDIAC CATHETERIZATION N/A 12/19/2014   Procedure: Coronary Stent Intervention;  Surgeon: Corky Crafts, MD;  Location: [facility] INVASIVE CV LAB;  Service: Cardiovascular;  Laterality: N/A;   CARDIAC CATHETERIZATION N/A 01/01/2015   Procedure: Left Heart Cath and Cors/Grafts Angiography;  Surgeon: Corky Crafts, MD;  Location[facility] INVASIVE CV LAB;  Service: Cardiovascular;  Laterality: N/A;   CARDIAC CATHETERIZATION N/A 09/20/2015   Procedure: Left Heart Cath and Cors/Grafts Angiography;  Surgeon: Corky Crafts, MD;  Location: [facility] INVASIVE CV LAB;  Service: Cardiovascular;  Laterality: N/A;   CATARACT EXTRACTION W/ INTRAOCULAR LENS  IMPLANT, BILATERAL Bilateral 2011   COLONOSCOPY WITH PROPOFOL N/A 07/27/2016   Procedure: COLONOSCOPY WITH PROPOFOL;  Surgeon: Charlott Rakes, MD;  Location: [facility] ENDOSCOPY;  Service: Endoscopy;  Laterality: N/A;   CORONARY ARTERY BYPASS GRAFT  2000   ASCVD, multivessel, S./P.   DILATION AND CURETTAGE OF UTERUS  1980s   ESOPHAGOGASTRODUODENOSCOPY (EGD) WITH PROPOFOL N/A 07/27/2016   Procedure: ESOPHAGOGASTRODUODENOSCOPY (EGD) WITH PROPOFOL;  Surgeon: Charlott Rakes, MD;  Location: [facility] ENDOSCOPY;  Service: Endoscopy;  Laterality: N/A;   FRACTURE SURGERY     LEFT HEART CATH AND CORS/GRAFTS ANGIOGRAPHY N/A  01/12/2018   Procedure: LEFT HEART CATH AND CORS/GRAFTS ANGIOGRAPHY;  Surgeon: Corky Crafts, MD;  Location: [facility] INVASIVE CV LAB;  Service: Cardiovascular;  Laterality: N/A;   PATELLA FRACTURE SURGERY Left 1993    Allergies  Allergen Reactions   Nsaids Other (See Comments)    Due to chronic kidney failure   Butorphanol Other (See Comments)    agitation Constipation Produced a lot of  urine, made patient feel crazy   Sulfa Antibiotics Rash   Bisoprolol Fumarate Other (See Comments)    Doesn't remember   Butorphanol Tartrate Other (See Comments)    Produced a lot of urine, made patient feel crazy   Codeine Nausea And Vomiting   Demerol [Meperidine] Nausea And Vomiting   Imipramine     Sweating, facial dysfunction    Meperidine And Related Nausea And Vomiting   Statins     MYALGIAS   Tequin [Gatifloxacin] Other (See Comments)    Caused hypoglycemia Low blood sugar   Brilinta [Ticagrelor] Rash    CAUSES PETECHIAE, PURPURA   Pseudoephedrine Hcl Palpitations   Septra [Sulfamethoxazole-Trimethoprim] Rash   Sulfamethoxazole-Trimethoprim Rash    Outpatient Encounter Medications as of 07/07/2022  Medication Sig   acetaminophen (TYLENOL) 500 MG tablet Take 1,000 mg by mouth 2 (two) times daily as needed.   alum & mag hydroxide-simeth (MAALOX PLUS) 400-400-40 MG/5ML suspension Take 10 mLs by mouth as needed for indigestion.   atorvastatin (LIPITOR) 10 MG tablet TAKE 1 TABLET ONCE DAILY.   Calcium-Phosphorus-Vitamin D (CALCIUM/VITAMIN D3/ADULT GUMMY) 250-100-500 MG-MG-UNIT CHEW Chew 1 Piece of gum by mouth 2 (two) times daily.   Cholecalciferol (VITAMIN D) 50 MCG (2000 UT) tablet Take 2,000 Units by mouth daily.   clopidogrel (PLAVIX) 75 MG tablet TAKE 1 TABLET ONCE DAILY.   COENZYME Q-10 PO Take 10 mg by mouth at bedtime.   cyanocobalamin 1000 MCG tablet Take 1,000 mcg by mouth daily.   fluticasone (FLONASE) 50 MCG/ACT nasal spray Place 2 sprays into both nostrils daily as needed  for allergies.    furosemide (LASIX) 20 MG tablet Take 20 mg by mouth daily.   isosorbide mononitrate (IMDUR) 60 MG 24 hr tablet Take 2 tablets (120 mg total) by mouth daily.   ketoconazole (NIZORAL) 2 % shampoo Apply 1 Application topically 2 (two) times a week. Tuesday and Friday   levocetirizine (XYZAL) 5 MG tablet TAKE 1 TABLET ONCE DAILY IN THE EVENING.   levothyroxine (SYNTHROID) 75 MCG tablet TAKE 1 TABLET ONCE DAILY ON EMPTY STOMACH.   losartan (COZAAR) 25 MG tablet Take 25 mg by mouth daily.   melatonin 1 MG TABS tablet Take 1 mg by mouth at bedtime.   memantine (NAMENDA) 10 MG tablet Take 10 mg by mouth 2 (two) times daily.   Menthol, Topical Analgesic, (BIOFREEZE) 10 % LIQD Apply topically as needed.   methocarbamol (ROBAXIN) 750 MG tablet Take 750 mg by mouth every 6 (six) hours as needed for muscle spasms.   metoprolol succinate (TOPROL-XL) 25 MG 24 hr tablet Take 25 mg by mouth daily. Take with or immediately following a meal.   miconazole (MICOTIN) 2 % powder Apply topically 2 (two) times daily.  Apply to groin and pannus topically   MYRBETRIQ 50 MG TB24 tablet TAKE 1 TABLET BY MOUTH DAILY.   nitroGLYCERIN (NITROSTAT) 0.4 MG SL tablet Place 0.4 mg under the tongue every 5 (five) minutes as needed for chest pain. X 3 doses   potassium chloride (MICRO-K) 10 MEQ CR capsule Take 20 mEq by mouth 2 (two) times daily.   predniSONE (DELTASONE) 5 MG tablet Take 5 mg by mouth daily.   PSYLLIUM HUSK PO Take 0.4 g by mouth daily. May keep at bedside   ranolazine (RANEXA) 1000 MG SR tablet TAKE 1 TABLET BY MOUTH TWICE DAILY.   venlafaxine (EFFEXOR) 75 MG tablet Take 150 mg by mouth. 2 caps= 150 mg; oral  Once A Day  zinc oxide 20 % ointment Apply 1 application topically as needed for irritation.   [DISCONTINUED] famotidine (PEPCID) 10 MG tablet Take 1 tablet (10 mg total) by mouth daily.   [DISCONTINUED] nystatin (MYCOSTATIN/NYSTOP) powder Apply 1 application topically. apply underneath  abd/breast folds As Needed   No facility-administered encounter medications on file as of 07/07/2022.    Review of Systems  Constitutional:  Negative for activity change, appetite change and fever.  HENT:  Positive for hearing loss. Negative for congestion and voice change.   Eyes:  Negative for visual disturbance.  Respiratory:  Positive for shortness of breath. Negative for cough and wheezing.   Cardiovascular:  Positive for leg swelling.  Gastrointestinal:  Negative for abdominal pain and constipation.  Genitourinary:  Negative for dysuria, frequency and urgency.       1-2x/night bathroom trips.   Musculoskeletal:  Positive for arthralgias, gait problem and myalgias.  Skin:  Negative for color change.  Neurological:  Negative for speech difficulty, weakness and headaches.       Memory lapses.   Psychiatric/Behavioral:  Negative for behavioral problems, hallucinations and sleep disturbance. The patient is not nervous/anxious.     Immunization History  Administered Date(s) Administered   Fluad Quad(high Dose 65+) 01/12/2022   HPV Bivalent 11/24/2013, 01/17/2016   Influenza Whole 12/23/2017   Influenza, High Dose Seasonal PF 12/24/2018, 12/24/2018   Influenza-Unspecified 01/10/2013, 12/27/2014, 12/30/2016, 01/02/2020, 01/09/2021   Moderna SARS-COV2 Booster Vaccination 08/08/2021   Moderna Sars-Covid-2 Vaccination 03/25/2019, 04/22/2019, 01/30/2020, 08/20/2020, 12/10/2020   Pfizer Covid-19 Vaccine Bivalent Booster 29yrs & up 01/22/2022   Pneumococcal Conjugate-13 04/21/2013   Pneumococcal Polysaccharide-23 02/27/1993, 03/23/2009   RSV,unspecified 05/25/2022   Tdap 04/20/2012   Zoster Recombinat (Shingrix) 03/29/2021, 06/09/2021   Zoster, Live 11/10/2010   Pertinent  Health Maintenance Due  Topic Date Due   INFLUENZA VACCINE  10/22/2022   DEXA SCAN  Completed      10/15/2018    8:00 AM 02/10/2019    9:44 AM 03/27/2021    9:56 PM 03/31/2022   10:18 AM 04/03/2022    2:08 PM   Fall Risk  Falls in the past year?  0  0 0  Was there an injury with Fall?    0 0  Fall Risk Category Calculator    0 0  Fall Risk Category (Retired)    Low Low  (RETIRED) Patient Fall Risk Level Moderate fall risk  Moderate fall risk Low fall risk Low fall risk  Patient at Risk for Falls Due to    No Fall Risks No Fall Risks  Fall risk Follow up    Falls evaluation completed Falls evaluation completed   Functional Status Survey:    Vitals:   07/07/22 0932  BP: (!) 128/58  Pulse: 65  Resp: 18  Temp: (!) 97 F (36.1 C)  SpO2: 97%  Weight: 194 lb (88 kg)  Height: 4\' 10"  (1.473 m)   Body mass index is 40.55 kg/m. Physical Exam Vitals and nursing note reviewed.  Constitutional:      Appearance: Normal appearance.  HENT:     Head: Normocephalic and atraumatic.     Mouth/Throat:     Mouth: Mucous membranes are moist.  Eyes:     Extraocular Movements: Extraocular movements intact.     Conjunctiva/sclera: Conjunctivae normal.     Pupils: Pupils are equal, round, and reactive to light.  Cardiovascular:     Rate and Rhythm: Normal rate and regular rhythm.     Heart sounds:  No gallop.  Pulmonary:     Effort: Pulmonary effort is normal.     Breath sounds: No rales.  Abdominal:     General: Bowel sounds are normal.     Palpations: Abdomen is soft.     Tenderness: There is no abdominal tenderness.  Musculoskeletal:        General: No tenderness.     Cervical back: Normal range of motion and neck supple.     Right lower leg: Edema present.     Left lower leg: Edema present.     Comments: Trace edema RLE, fingers. Aching muscle in thighs.   Skin:    General: Skin is warm and dry.  Neurological:     General: No focal deficit present.     Mental Status: She is alert and oriented to person, place, and time. Mental status is at baseline.     Gait: Gait abnormal.  Psychiatric:        Mood and Affect: Mood normal.        Behavior: Behavior normal.        Thought  Content: Thought content normal.        Judgment: Judgment normal.     Labs reviewed: Recent Labs    12/05/21 0000 05/28/22 0000  NA 140 140  K 4.4 4.2  CL 109* 107  CO2 24* 23*  BUN 29* 40*  CREATININE 1.4* 1.6*  CALCIUM 8.7 8.3*   Recent Labs    12/05/21 0000  AST 11*  ALT 7  ALKPHOS 49  ALBUMIN 3.9   Recent Labs    12/05/21 0000  WBC 12.2  NEUTROABS 8,747.00  HGB 10.3*  HCT 32*  PLT 268   Lab Results  Component Value Date   TSH 3.56 04/01/2021   Lab Results  Component Value Date   HGBA1C 5.1 02/14/2019   Lab Results  Component Value Date   CHOL 151 08/24/2020   HDL 57 08/24/2020   LDLCALC 70 08/24/2020   TRIG 162 (A) 08/24/2020   CHOLHDL 2.1 06/07/2018    Significant Diagnostic Results in last 30 days:  No results found.  Assessment/Plan Polymyalgia rheumatica (HCC) flare up muscle aches, right thigh pain when walking, left thigh pain when pressed with her hand since Dr. Kathi Ludwig decreased her Prednisone(5mg  qod 04/09/21, failed, on 5mg  qd now), able to ambulate with walker, not new, responded to higher dose of Prednisone in the past.   PMR, takes Prednisone 5mg  qd(failed GDR)  Increase Prednisone 20mg  qd x7 days, then 8mg  qd x7 days, then 5mg  qd.   Essential hypertension Blood pressure is controlled, on Metoprolol, Losartan  Memory loss MMSE 29/30 06/06/21, 29/30 05/27/22, 07/02/21 CT head w/o CM, No acute abnormality no change from earlier this year. Moderate atrophy and moderate chronic microvascular ischemic change in the white matter. On Memantine.   Gait instability uses walker for ambulation, w/c to go further.   CKD (chronic kidney disease) stage 3, GFR 30-59 ml/min (HCC) Bun/creat 40/1.6 05/28/22  CAD (coronary artery disease) on Isosorbide and Ranolazine, Plavix. LDL 70 08/24/20  Hypothyroidism  on Levothyroxine, TSH 3.03 12/04/21  Anemia due to chronic kidney disease on B12, Iron 47 05/22/21, Hgb 10.3 12/05/21  Urinary frequency Urinary  frequency and occasional incontinent of urine, on Myrbetriq.  GERD (gastroesophageal reflux disease) stable, prn Maalox, 06/05/22 esophogram barium swallow study, dysmotility, GERD  Depression, recurrent (HCC)  stable, on Venlafaxine  Generalized osteoarthritis of multiple sites  takes Tylenol, Methocarbamol prn, 06/18/22 ortho Madelaine Etienne  R+L hip  Osteoporosis  takes Ca, Vit D. 04/29/21 Dr. Kathi Ludwig dc Fosamax.  05/07/21 DEXA t score -1.273  Hyperlipidemia LDL goal <70  taking Atorvastatin.      Family/ staff Communication: plan of care reviewed with the patient and charge nurse.   Labs/tests ordered:  none  Time spend 40 minutes.

## 2022-07-07 NOTE — Assessment & Plan Note (Signed)
Urinary frequency and occasional incontinent of urine, on Myrbetriq. 

## 2022-07-07 NOTE — Assessment & Plan Note (Signed)
taking Atorvastatin.  ?

## 2022-07-07 NOTE — Assessment & Plan Note (Signed)
on Levothyroxine, TSH 3.03 12/04/21 

## 2022-07-07 NOTE — Assessment & Plan Note (Signed)
stable, on Venlafaxine 

## 2022-07-07 NOTE — Assessment & Plan Note (Signed)
Bun/creat 40/1.6 05/28/22

## 2022-07-07 NOTE — Assessment & Plan Note (Addendum)
takes Tylenol, Methocarbamol prn, 06/18/22 ortho X-ay R+L hip

## 2022-07-07 NOTE — Assessment & Plan Note (Addendum)
stable, prn Maalox, 06/05/22 esophogram barium swallow study, dysmotility, GERD

## 2022-07-07 NOTE — Assessment & Plan Note (Signed)
on B12, Iron 47 05/22/21, Hgb 10.3 12/05/21 

## 2022-07-07 NOTE — Assessment & Plan Note (Signed)
on Isosorbide and Ranolazine, Plavix. LDL 70 08/24/20 

## 2022-07-07 NOTE — Assessment & Plan Note (Addendum)
flare up muscle aches, right thigh pain when walking, left thigh pain when pressed with her hand since Dr. Kathi Ludwig decreased her Prednisone(5mg  qod 04/09/21, failed, on  qd now), able to ambulate with walker, not new, responded to higher dose of Prednisone in the past.   PMR, takes Prednisone  qd(failed GDR)  Increase Prednisone  qd x7 days, then  qd x7 days, then  qd.

## 2022-07-13 ENCOUNTER — Encounter: Payer: Self-pay | Admitting: Adult Health

## 2022-07-13 ENCOUNTER — Non-Acute Institutional Stay: Payer: Medicare Other | Admitting: Adult Health

## 2022-07-13 DIAGNOSIS — M353 Polymyalgia rheumatica: Secondary | ICD-10-CM

## 2022-07-13 MED ORDER — PREDNISONE 20 MG PO TABS: ORAL_TABLET | ORAL | 0 refills | Status: AC

## 2022-07-13 NOTE — Progress Notes (Signed)
Location:  [facility] Room Number: 911 A Place of Service:  ALF (13) Provider:  Safa Derner C. Grayce Sessions, NP   Patient Care Team: Mast, Man X, NP as PCP - General (Internal Medicine) Corky Crafts, MD as PCP - Cardiology (Cardiology) Charlott Rakes, MD as Consulting Physician (Gastroenterology) August Saucer, Corrie Mckusick, MD as Consulting Physician (Orthopedic Surgery) Levert Feinstein, MD as Consulting Physician (Neurology) Marcine Matar, MD as Consulting Physician (Urology) Donalee Citrin, MD as Consulting Physician (Neurosurgery)  Extended Emergency Contact Information Primary Emergency Contact: Darlin Priestly Address: [address]          [place] [address] [place] Home Phone: [phone] Mobile Phone: [phone] Relation: Spouse Secondary Emergency Contact: Mcmillon,NATHAN Mobile Phone: [phone] Relation: Son  Code Status:  DNR Goals of care: Advanced Directive information    07/13/2022    9:45 AM  Advanced Directives  Does Patient Have a Medical Advance Directive? Yes  Type of Advance Directive Living will;Healthcare Power of [place];Out of facility DNR (pink MOST or yellow form)  Does patient want to make changes to medical advance directive? No - Patient declined  Copy of Healthcare Power of Attorney in Chart? Yes - validated most recent copy scanned in chart (See row information)  Pre-existing out of facility DNR order (yellow form or pink MOST form) Pink MOST form placed in chart (order not valid for inpatient use);Yellow form placed in chart (order not valid for inpatient use)     Chief Complaint  Patient presents with   Acute Visit    Right Thigh Pain    HPI:  Pt is a 82 y.o. female seen today for an acute visit for right thigh pain. She is a resident of [facility]. She is currently on tapering dose of Prednisone. She stated that she was feeling better but now that she is about to  finished the course, she started having right thigh pain again, 6/10. She is able to walk with the walker.    Past Medical History:  Diagnosis Date   Anemia    Arthritis    "fingers, back, shoulders, hips" (04/10/2016)   Chronic diastolic CHF (congestive heart failure)    a. normal EF, LVEDP at [facility] in 6/17 33 >> Lasix started    CKD (chronic kidney disease), stage III    Coronary artery disease    a. s/p CABG 2000 (SVG/ free LIMA Y graft to the diagonal and distal LAD, SVG to the OM 1, and SVG to the PDA) . b. Cath 12/19/2014 90% dSVG to RCA s/p 2 overlapping DES. c. LHC 2016, 2017 no intervention except diuresis needed. d. Low risk nuc 03/2016. e. Cath 12/2017 patent LIMA-LAD, SVG-OM, SVG-PDA but occluded SVG to diag.    Depression    with anxious component   GERD (gastroesophageal reflux disease)    History of hiatal hernia    Hyperlipidemia    Hypothyroidism    Obesity    OSA on CPAP    PMR (polymyalgia rheumatica)    Pneumonia    "2-3 times" (04/10/2016)   Stable angina    microvascular, improved with Ranexa   Subclavian artery stenosis    a. L by cath note in 2016.   TIA (transient ischemic attack)    Past Surgical History:  Procedure Laterality Date   BREAST BIOPSY Right 1964   CARDIAC CATHETERIZATION  08   patent grafts, no culprit lesions, EF 65%   CARDIAC CATHETERIZATION N/A 12/19/2014  Procedure: Left Heart Cath and Cors/Grafts Angiography;  Surgeon: Corky Crafts, MD;  Location: [facility] INVASIVE CV LAB;  Service: Cardiovascular;  Laterality: N/A;   CARDIAC CATHETERIZATION N/A 12/19/2014   Procedure: Coronary Stent Intervention;  Surgeon: Corky Crafts, MD;  Location: [facility] INVASIVE CV LAB;  Service: Cardiovascular;  Laterality: N/A;   CARDIAC CATHETERIZATION N/A 01/01/2015   Procedure: Left Heart Cath and Cors/Grafts Angiography;  Surgeon: Corky Crafts, MD;  Location: [facility] INVASIVE CV LAB;  Service: Cardiovascular;  Laterality: N/A;   CARDIAC CATHETERIZATION  N/A 09/20/2015   Procedure: Left Heart Cath and Cors/Grafts Angiography;  Surgeon: Corky Crafts, MD;  Location: [facility] INVASIVE CV LAB;  Service: Cardiovascular;  Laterality: N/A;   CATARACT EXTRACTION W/ INTRAOCULAR LENS  IMPLANT, BILATERAL Bilateral 2011   COLONOSCOPY WITH PROPOFOL N/A 07/27/2016   Procedure: COLONOSCOPY WITH PROPOFOL;  Surgeon: Charlott Rakes, MD;  Location: [facility] ENDOSCOPY;  Service: Endoscopy;  Laterality: N/A;   CORONARY ARTERY BYPASS GRAFT  2000   ASCVD, multivessel, S./P.   DILATION AND CURETTAGE OF UTERUS  1980s   ESOPHAGOGASTRODUODENOSCOPY (EGD) WITH PROPOFOL N/A 07/27/2016   Procedure: ESOPHAGOGASTRODUODENOSCOPY (EGD) WITH PROPOFOL;  Surgeon: Charlott Rakes, MD;  Location: [facility] ENDOSCOPY;  Service: Endoscopy;  Laterality: N/A;   FRACTURE SURGERY     LEFT HEART CATH AND CORS/GRAFTS ANGIOGRAPHY N/A 01/12/2018   Procedure: LEFT HEART CATH AND CORS/GRAFTS ANGIOGRAPHY;  Surgeon: Corky Crafts, MD;  Location: [facility] INVASIVE CV LAB;  Service: Cardiovascular;  Laterality: N/A;   PATELLA FRACTURE SURGERY Left 1993    Allergies  Allergen Reactions   Nsaids Other (See Comments)    Due to chronic kidney failure   Butorphanol Other (See Comments)    agitation Constipation Produced a lot of urine, made patient feel crazy   Sulfa Antibiotics Rash   Bisoprolol Fumarate Other (See Comments)    Doesn't remember   Butorphanol Tartrate Other (See Comments)    Produced a lot of urine, made patient feel crazy   Codeine Nausea And Vomiting   Demerol [Meperidine] Nausea And Vomiting   Imipramine     Sweating, facial dysfunction    Meperidine And Related Nausea And Vomiting   Statins     MYALGIAS   Tequin [Gatifloxacin] Other (See Comments)    Caused hypoglycemia Low blood sugar   Brilinta [Ticagrelor] Rash    CAUSES PETECHIAE, PURPURA   Pseudoephedrine Hcl Palpitations   Septra [Sulfamethoxazole-Trimethoprim] Rash   Sulfamethoxazole-Trimethoprim Rash    Outpatient  Encounter Medications as of 07/13/2022  Medication Sig   acetaminophen (TYLENOL) 500 MG tablet Take 1,000 mg by mouth 2 (two) times daily as needed.   alum & mag hydroxide-simeth (MAALOX PLUS) 400-400-40 MG/5ML suspension Take 10 mLs by mouth as needed for indigestion.   atorvastatin (LIPITOR) 10 MG tablet TAKE 1 TABLET ONCE DAILY.   Calcium-Phosphorus-Vitamin D (CALCIUM/VITAMIN D3/ADULT GUMMY) 250-100-500 MG-MG-UNIT CHEW Chew 1 Piece of gum by mouth 2 (two) times daily.   Cholecalciferol (VITAMIN D) 50 MCG (2000 UT) tablet Take 2,000 Units by mouth daily.   clopidogrel (PLAVIX) 75 MG tablet TAKE 1 TABLET ONCE DAILY.   COENZYME Q-10 PO Take 10 mg by mouth at bedtime.   cyanocobalamin 1000 MCG tablet Take 1,000 mcg by mouth daily.   fluticasone (FLONASE) 50 MCG/ACT nasal spray Place 2 sprays into both nostrils daily as needed for allergies.    furosemide (LASIX) 20 MG tablet Take 20 mg by mouth daily.   isosorbide mononitrate (IMDUR) 60 MG 24 hr tablet  Take 2 tablets (120 mg total) by mouth daily.   ketoconazole (NIZORAL) 2 % shampoo Apply 1 Application topically 2 (two) times a week. Tuesday and Friday   levocetirizine (XYZAL) 5 MG tablet TAKE 1 TABLET ONCE DAILY IN THE EVENING.   levothyroxine (SYNTHROID) 75 MCG tablet TAKE 1 TABLET ONCE DAILY ON EMPTY STOMACH.   losartan (COZAAR) 25 MG tablet Take 25 mg by mouth daily.   melatonin 1 MG TABS tablet Take 1 mg by mouth at bedtime.   memantine (NAMENDA) 10 MG tablet Take 10 mg by mouth 2 (two) times daily.   Menthol, Topical Analgesic, (BIOFREEZE) 10 % LIQD Apply topically as needed.   methocarbamol (ROBAXIN) 750 MG tablet Take 750 mg by mouth every 6 (six) hours as needed for muscle spasms.   metoprolol succinate (TOPROL-XL) 25 MG 24 hr tablet Take 25 mg by mouth daily. Take with or immediately following a meal.   miconazole (MICOTIN) 2 % powder Apply topically 2 (two) times daily.  Apply to groin and pannus topically   MYRBETRIQ 50 MG TB24  tablet TAKE 1 TABLET BY MOUTH DAILY.   nitroGLYCERIN (NITROSTAT) 0.4 MG SL tablet Place 0.4 mg under the tongue every 5 (five) minutes as needed for chest pain. X 3 doses   potassium chloride (MICRO-K) 10 MEQ CR capsule Take 20 mEq by mouth 2 (two) times daily.   predniSONE (DELTASONE) 5 MG tablet Take 5 mg by mouth daily.   PSYLLIUM HUSK PO Take 0.4 g by mouth daily. May keep at bedside   ranolazine (RANEXA) 1000 MG SR tablet TAKE 1 TABLET BY MOUTH TWICE DAILY.   venlafaxine (EFFEXOR) 75 MG tablet Take 150 mg by mouth. 2 caps= 150 mg; oral  Once A Day   zinc oxide 20 % ointment Apply 1 application topically as needed for irritation.   No facility-administered encounter medications on file as of 07/13/2022.    Review of Systems  Constitutional:  Negative for appetite change, chills, fatigue and fever.  HENT:  Negative for congestion, hearing loss, rhinorrhea and sore throat.   Eyes: Negative.   Respiratory:  Negative for cough, shortness of breath and wheezing.   Cardiovascular:  Negative for chest pain, palpitations and leg swelling.  Gastrointestinal:  Negative for abdominal pain, constipation, diarrhea, nausea and vomiting.  Genitourinary:  Negative for dysuria.  Musculoskeletal:  Positive for arthralgias. Negative for back pain and myalgias.  Skin:  Negative for color change, rash and wound.  Neurological:  Negative for dizziness, weakness and headaches.  Psychiatric/Behavioral:  Negative for behavioral problems. The patient is not nervous/anxious.     Immunization History  Administered Date(s) Administered   Fluad Quad(high Dose 65+) 01/12/2022   HPV Bivalent 11/24/2013, 01/17/2016   Influenza Whole 12/23/2017   Influenza, High Dose Seasonal PF 12/24/2018, 12/24/2018   Influenza-Unspecified 01/10/2013, 12/27/2014, 12/30/2016, 01/02/2020, 01/09/2021   Moderna SARS-COV2 Booster Vaccination 08/08/2021   Moderna Sars-Covid-2 Vaccination 03/25/2019, 04/22/2019, 01/30/2020,  08/20/2020, 12/10/2020   Pfizer Covid-19 Vaccine Bivalent Booster 50yrs & up 01/22/2022   Pneumococcal Conjugate-13 04/21/2013   Pneumococcal Polysaccharide-23 02/27/1993, 03/23/2009   RSV,unspecified 05/25/2022   Tdap 04/20/2012   Zoster Recombinat (Shingrix) 03/29/2021, 06/09/2021   Zoster, Live 11/10/2010   Pertinent  Health Maintenance Due  Topic Date Due   INFLUENZA VACCINE  10/22/2022   DEXA SCAN  Completed      10/15/2018    8:00 AM 02/10/2019    9:44 AM 03/27/2021    9:56 PM 03/31/2022   10:18 AM  04/03/2022    2:08 PM  Fall Risk  Falls in the past year?  0  0 0  Was there an injury with Fall?    0 0  Fall Risk Category Calculator    0 0  Fall Risk Category (Retired)    Low Low  (RETIRED) Patient Fall Risk Level Moderate fall risk  Moderate fall risk Low fall risk Low fall risk  Patient at Risk for Falls Due to    No Fall Risks No Fall Risks  Fall risk Follow up    Falls evaluation completed Falls evaluation completed   Functional Status Survey:    Vitals:   07/13/22 0948  BP: (!) 132/58  Pulse: 70  Resp: 20  Temp: 97.6 F (36.4 C)  SpO2: 96%  Weight: 194 lb (88 kg)  Height: 4\' 10"  (1.473 m)   Body mass index is 40.55 kg/m. Physical Exam Constitutional:      Appearance: She is obese.     Comments: Morbidly obese  HENT:     Head: Normocephalic and atraumatic.     Nose: Nose normal.     Mouth/Throat:     Mouth: Mucous membranes are moist.  Eyes:     Conjunctiva/sclera: Conjunctivae normal.  Cardiovascular:     Rate and Rhythm: Normal rate and regular rhythm.  Pulmonary:     Effort: Pulmonary effort is normal.     Breath sounds: Normal breath sounds.  Abdominal:     General: Bowel sounds are normal.     Palpations: Abdomen is soft.  Musculoskeletal:        General: Normal range of motion.     Cervical back: Normal range of motion.  Skin:    General: Skin is warm and dry.  Neurological:     General: No focal deficit present.     Mental Status: She  is alert and oriented to person, place, and time.  Psychiatric:        Mood and Affect: Mood normal.        Behavior: Behavior normal.        Thought Content: Thought content normal.        Judgment: Judgment normal.     Labs reviewed: Recent Labs    12/05/21 0000 05/28/22 0000  NA 140 140  K 4.4 4.2  CL 109* 107  CO2 24* 23*  BUN 29* 40*  CREATININE 1.4* 1.6*  CALCIUM 8.7 8.3*   Recent Labs    12/05/21 0000  AST 11*  ALT 7  ALKPHOS 49  ALBUMIN 3.9   Recent Labs    12/05/21 0000  WBC 12.2  NEUTROABS 8,747.00  HGB 10.3*  HCT 32*  PLT 268   Lab Results  Component Value Date   TSH 3.56 04/01/2021   Lab Results  Component Value Date   HGBA1C 5.1 02/14/2019   Lab Results  Component Value Date   CHOL 151 08/24/2020   HDL 57 08/24/2020   LDLCALC 70 08/24/2020   TRIG 162 (A) 08/24/2020   CHOLHDL 2.1 06/07/2018    Significant Diagnostic Results in last 30 days:  No results found.  Assessment/Plan  1. Polymyalgia rheumatica (HCC) - will re-start on Prednisone taper - predniSONE (DELTASONE) 20 MG tablet; Take 1 tablet (20 mg total) by mouth 2 (two) times daily with a meal for 3 days, THEN 1 tablet (20 mg total) daily at 12 noon for 5 days.  Dispense: 11 tablet; Refill: 0 -  continue Acetaminophen 500 mg 2  tabs = 1,000 mg PO BID and BID PRN for pain     Family/ staff Communication:  Discussed plan of care with resident and charge nurse.  Labs/tests ordered:  None

## 2022-07-29 ENCOUNTER — Telehealth: Payer: Self-pay | Admitting: Gastroenterology

## 2022-07-29 NOTE — Telephone Encounter (Signed)
Called patient to advise we received records from [facility] and referral for pain consult need to ask reason for request and pain location. Left voicemail

## 2022-07-31 ENCOUNTER — Telehealth: Payer: Self-pay | Admitting: Gastroenterology

## 2022-07-31 NOTE — Telephone Encounter (Signed)
Hi Dr. Myrtie Neither,  We receievd a referral for patient to be evaluated for abdominal pain. She is requesting a transfer of care specifically over to you if possible from Dr. Bosie Clos. Her records were obtained and scanned into [employer] for you to review and advise on scheduling.   Thank you

## 2022-07-31 NOTE — Telephone Encounter (Signed)
Request received to transfer GI care from outside practice to [facility].  We appreciate the interest in our practice, however due to high demand from patients without established GI providers, we cannot accommodate this transfer.    - HD

## 2022-08-05 ENCOUNTER — Non-Acute Institutional Stay: Payer: Medicare Other | Admitting: Adult Health

## 2022-08-05 ENCOUNTER — Encounter: Payer: Self-pay | Admitting: Adult Health

## 2022-08-05 DIAGNOSIS — I10 Essential (primary) hypertension: Secondary | ICD-10-CM

## 2022-08-05 DIAGNOSIS — K219 Gastro-esophageal reflux disease without esophagitis: Secondary | ICD-10-CM | POA: Diagnosis not present

## 2022-08-05 MED ORDER — PANTOPRAZOLE SODIUM 40 MG PO TBEC: 40.0000 mg | DELAYED_RELEASE_TABLET | Freq: Two times a day (BID) | ORAL | 3 refills | Status: DC

## 2022-08-05 NOTE — Progress Notes (Signed)
Location:  [facility] Room Number: 911A Place of Service:  ALF [phone]) Provider:  Kenard Gower, DNP, FNP-BC  Patient Care Team: Mast, Man X, NP as PCP - General (Internal Medicine) Corky Crafts, MD as PCP - Cardiology (Cardiology) Charlott Rakes, MD as Consulting Physician (Gastroenterology) August Saucer Corrie Mckusick, MD as Consulting Physician (Orthopedic Surgery) Levert Feinstein, MD as Consulting Physician (Neurology) Marcine Matar, MD as Consulting Physician (Urology) Donalee Citrin, MD as Consulting Physician (Neurosurgery)  Extended Emergency Contact Information Primary Emergency Contact: Darlin Priestly Address: [address]          [place] [address] [place] Home Phone: [phone] Mobile Phone: [phone] Relation: Spouse Secondary Emergency Contact: Edelen,NATHAN Mobile Phone: [phone] Relation: Son  Code Status:  DNR  Goals of care: Advanced Directive information    08/05/2022    9:09 AM  Advanced Directives  Does Patient Have a Medical Advance Directive? Yes  Type of Advance Directive Living will;Healthcare Power of [place];Out of facility DNR (pink MOST or yellow form)  Does patient want to make changes to medical advance directive? No - Patient declined  Copy of Healthcare Power of Attorney in Chart? Yes - validated most recent copy scanned in chart (See row information)  Pre-existing out of facility DNR order (yellow form or pink MOST form) Pink MOST form placed in chart (order not valid for inpatient use);Yellow form placed in chart (order not valid for inpatient use)     Chief Complaint  Patient presents with   Acute Visit    Acid reflux     HPI:  Pt is a 82 y.o. female seen today for an acute visit for acid reflux. She is a resident of [facility].  She complained that whenever she gets up to the bathroom at night, she has burning sensation at her epigastric area. She  denies nausea nor vomiting. She currently takes Protonix 40 mg daily for GERD.  BP 124/62, takes Losartan and Metoprolol succinate for hypertension.   Past Medical History:  Diagnosis Date   Anemia    Arthritis    "fingers, back, shoulders, hips" (04/10/2016)   Chronic diastolic CHF (congestive heart failure) (HCC)    a. normal EF, LVEDP at [facility] in 6/17 33 >> Lasix started    CKD (chronic kidney disease), stage III (HCC)    Coronary artery disease    a. s/p CABG 2000 (SVG/ free LIMA Y graft to the diagonal and distal LAD, SVG to the OM 1, and SVG to the PDA) . b. Cath 12/19/2014 90% dSVG to RCA s/p 2 overlapping DES. c. LHC 2016, 2017 no intervention except diuresis needed. d. Low risk nuc 03/2016. e. Cath 12/2017 patent LIMA-LAD, SVG-OM, SVG-PDA but occluded SVG to diag.    Depression    with anxious component   GERD (gastroesophageal reflux disease)    History of hiatal hernia    Hyperlipidemia    Hypothyroidism    Obesity    OSA on CPAP    PMR (polymyalgia rheumatica) (HCC)    Pneumonia    "2-3 times" (04/10/2016)   Stable angina    microvascular, improved with Ranexa   Subclavian artery stenosis (HCC)    a. L by cath note in 2016.   TIA (transient ischemic attack)    Past Surgical History:  Procedure Laterality Date   BREAST BIOPSY Right 1964   CARDIAC CATHETERIZATION  08   patent grafts, no culprit lesions, EF 65%  CARDIAC CATHETERIZATION N/A 12/19/2014   Procedure: Left Heart Cath and Cors/Grafts Angiography;  Surgeon: Corky Crafts, MD;  Location: [facility] INVASIVE CV LAB;  Service: Cardiovascular;  Laterality: N/A;   CARDIAC CATHETERIZATION N/A 12/19/2014   Procedure: Coronary Stent Intervention;  Surgeon: Corky Crafts, MD;  Location: [facility] INVASIVE CV LAB;  Service: Cardiovascular;  Laterality: N/A;   CARDIAC CATHETERIZATION N/A 01/01/2015   Procedure: Left Heart Cath and Cors/Grafts Angiography;  Surgeon: Corky Crafts, MD;  Location: [facility] INVASIVE CV LAB;   Service: Cardiovascular;  Laterality: N/A;   CARDIAC CATHETERIZATION N/A 09/20/2015   Procedure: Left Heart Cath and Cors/Grafts Angiography;  Surgeon: Corky Crafts, MD;  Location: [facility] INVASIVE CV LAB;  Service: Cardiovascular;  Laterality: N/A;   CATARACT EXTRACTION W/ INTRAOCULAR LENS  IMPLANT, BILATERAL Bilateral 2011   COLONOSCOPY WITH PROPOFOL N/A 07/27/2016   Procedure: COLONOSCOPY WITH PROPOFOL;  Surgeon: Charlott Rakes, MD;  Location: [facility] ENDOSCOPY;  Service: Endoscopy;  Laterality: N/A;   CORONARY ARTERY BYPASS GRAFT  2000   ASCVD, multivessel, S./P.   DILATION AND CURETTAGE OF UTERUS  1980s   ESOPHAGOGASTRODUODENOSCOPY (EGD) WITH PROPOFOL N/A 07/27/2016   Procedure: ESOPHAGOGASTRODUODENOSCOPY (EGD) WITH PROPOFOL;  Surgeon: Charlott Rakes, MD;  Location: [facility] ENDOSCOPY;  Service: Endoscopy;  Laterality: N/A;   FRACTURE SURGERY     LEFT HEART CATH AND CORS/GRAFTS ANGIOGRAPHY N/A 01/12/2018   Procedure: LEFT HEART CATH AND CORS/GRAFTS ANGIOGRAPHY;  Surgeon: Corky Crafts, MD;  Location: [facility] INVASIVE CV LAB;  Service: Cardiovascular;  Laterality: N/A;   PATELLA FRACTURE SURGERY Left 1993    Allergies  Allergen Reactions   Nsaids Other (See Comments)    Due to chronic kidney failure   Butorphanol Other (See Comments)    agitation Constipation Produced a lot of urine, made patient feel crazy   Sulfa Antibiotics Rash   Bisoprolol Fumarate Other (See Comments)    Doesn't remember   Butorphanol Tartrate Other (See Comments)    Produced a lot of urine, made patient feel crazy   Codeine Nausea And Vomiting   Demerol [Meperidine] Nausea And Vomiting   Imipramine     Sweating, facial dysfunction    Meperidine And Related Nausea And Vomiting   Statins     MYALGIAS   Tequin [Gatifloxacin] Other (See Comments)    Caused hypoglycemia Low blood sugar   Brilinta [Ticagrelor] Rash    CAUSES PETECHIAE, PURPURA   Pseudoephedrine Hcl Palpitations   Septra  [Sulfamethoxazole-Trimethoprim] Rash   Sulfamethoxazole-Trimethoprim Rash    Outpatient Encounter Medications as of 08/05/2022  Medication Sig   acetaminophen (TYLENOL) 500 MG tablet Take 1,000 mg by mouth 2 (two) times daily as needed.   alum & mag hydroxide-simeth (MAALOX PLUS) 400-400-40 MG/5ML suspension Take 10 mLs by mouth as needed for indigestion.   atorvastatin (LIPITOR) 10 MG tablet TAKE 1 TABLET ONCE DAILY.   Calcium-Phosphorus-Vitamin D (CALCIUM/VITAMIN D3/ADULT GUMMY) 250-100-500 MG-MG-UNIT CHEW Chew 1 Piece of gum by mouth 2 (two) times daily.   Cholecalciferol (VITAMIN D) 50 MCG (2000 UT) tablet Take 2,000 Units by mouth daily.   clopidogrel (PLAVIX) 75 MG tablet TAKE 1 TABLET ONCE DAILY.   COENZYME Q-10 PO Take 10 mg by mouth at bedtime.   cyanocobalamin 1000 MCG tablet Take 1,000 mcg by mouth daily.   fluticasone (FLONASE) 50 MCG/ACT nasal spray Place 2 sprays into both nostrils daily as needed for allergies.    furosemide (LASIX) 20 MG tablet Take 20 mg by mouth daily.   isosorbide mononitrate (  IMDUR) 60 MG 24 hr tablet Take 2 tablets (120 mg total) by mouth daily.   ketoconazole (NIZORAL) 2 % shampoo Apply 1 Application topically 2 (two) times a week. Tuesday and Friday   levocetirizine (XYZAL) 5 MG tablet TAKE 1 TABLET ONCE DAILY IN THE EVENING.   levothyroxine (SYNTHROID) 75 MCG tablet TAKE 1 TABLET ONCE DAILY ON EMPTY STOMACH.   losartan (COZAAR) 25 MG tablet Take 25 mg by mouth daily.   melatonin 1 MG TABS tablet Take 1 mg by mouth at bedtime.   memantine (NAMENDA) 10 MG tablet Take 10 mg by mouth 2 (two) times daily.   Menthol, Topical Analgesic, (BIOFREEZE) 10 % LIQD Apply topically as needed.   methocarbamol (ROBAXIN) 750 MG tablet Take 750 mg by mouth every 6 (six) hours as needed for muscle spasms.   metoprolol succinate (TOPROL-XL) 25 MG 24 hr tablet Take 25 mg by mouth daily. Take with or immediately following a meal.   miconazole (MICOTIN) 2 % powder Apply  topically 2 (two) times daily.  Apply to groin and pannus topically   MYRBETRIQ 50 MG TB24 tablet TAKE 1 TABLET BY MOUTH DAILY.   nitroGLYCERIN (NITROSTAT) 0.4 MG SL tablet Place 0.4 mg under the tongue every 5 (five) minutes as needed for chest pain. X 3 doses   potassium chloride (MICRO-K) 10 MEQ CR capsule Take 20 mEq by mouth 2 (two) times daily.   predniSONE (DELTASONE) 5 MG tablet Take 5 mg by mouth daily.   PSYLLIUM HUSK PO Take 0.4 g by mouth daily. May keep at bedside   ranolazine (RANEXA) 1000 MG SR tablet TAKE 1 TABLET BY MOUTH TWICE DAILY.   venlafaxine (EFFEXOR) 75 MG tablet Take 150 mg by mouth. 2 caps= 150 mg; oral  Once A Day   zinc oxide 20 % ointment Apply 1 application topically as needed for irritation.   No facility-administered encounter medications on file as of 08/05/2022.    Review of Systems  Constitutional:  Negative for appetite change, chills, fatigue and fever.  HENT:  Negative for congestion, hearing loss, rhinorrhea and sore throat.   Eyes: Negative.   Respiratory:  Negative for cough, shortness of breath and wheezing.   Cardiovascular:  Negative for chest pain, palpitations and leg swelling.  Gastrointestinal:  Positive for abdominal pain. Negative for constipation, diarrhea, nausea and vomiting.  Genitourinary:  Negative for dysuria.  Musculoskeletal:  Negative for arthralgias, back pain and myalgias.  Skin:  Negative for color change, rash and wound.  Neurological:  Negative for dizziness, weakness and headaches.  Psychiatric/Behavioral:  Negative for behavioral problems. The patient is not nervous/anxious.        Immunization History  Administered Date(s) Administered   Fluad Quad(high Dose 65+) 01/12/2022   HPV Bivalent 11/24/2013, 01/17/2016   Influenza Whole 12/23/2017   Influenza, High Dose Seasonal PF 12/24/2018, 12/24/2018   Influenza-Unspecified 01/10/2013, 12/27/2014, 12/30/2016, 01/02/2020, 01/09/2021   Moderna SARS-COV2 Booster  Vaccination 08/08/2021   Moderna Sars-Covid-2 Vaccination 03/25/2019, 04/22/2019, 01/30/2020, 08/20/2020, 12/10/2020   Pfizer Covid-19 Vaccine Bivalent Booster 72yrs & up 01/22/2022   Pneumococcal Conjugate-13 04/21/2013   Pneumococcal Polysaccharide-23 02/27/1993, 03/23/2009   RSV,unspecified 05/25/2022   Tdap 04/20/2012   Zoster Recombinat (Shingrix) 03/29/2021, 06/09/2021   Zoster, Live 11/10/2010   Pertinent  Health Maintenance Due  Topic Date Due   INFLUENZA VACCINE  10/22/2022   DEXA SCAN  Completed      02/10/2019    9:44 AM 03/27/2021    9:56 PM 03/31/2022  10:18 AM 04/03/2022    2:08 PM 07/13/2022    9:50 AM  Fall Risk  Falls in the past year? 0  0 0 0  Was there an injury with Fall?   0 0 0  Fall Risk Category Calculator   0 0 0  Fall Risk Category (Retired)   Low Low   (RETIRED) Patient Fall Risk Level  Moderate fall risk Low fall risk Low fall risk   Patient at Risk for Falls Due to   No Fall Risks No Fall Risks   Fall risk Follow up   Falls evaluation completed Falls evaluation completed Falls evaluation completed     Vitals:   08/05/22 0911  BP: 124/62  Pulse: 74  Resp: 18  Temp: 98.1 F (36.7 C)  SpO2: 98%  Weight: 189 lb (85.7 kg)  Height: 4\' 10"  (1.473 m)   Body mass index is 39.5 kg/m.  Physical Exam Constitutional:      General: She is not in acute distress.    Appearance: She is obese.  HENT:     Head: Normocephalic and atraumatic.     Nose: Nose normal.     Mouth/Throat:     Mouth: Mucous membranes are moist.  Eyes:     Conjunctiva/sclera: Conjunctivae normal.  Cardiovascular:     Rate and Rhythm: Normal rate and regular rhythm.  Pulmonary:     Effort: Pulmonary effort is normal.     Breath sounds: Normal breath sounds.  Abdominal:     General: Bowel sounds are normal.     Palpations: Abdomen is soft.  Musculoskeletal:        General: Normal range of motion.     Cervical back: Normal range of motion.  Skin:    General: Skin is warm  and dry.  Neurological:     General: No focal deficit present.     Mental Status: She is alert and oriented to person, place, and time.  Psychiatric:        Mood and Affect: Mood normal.        Behavior: Behavior normal.        Thought Content: Thought content normal.        Judgment: Judgment normal.       Labs reviewed: Recent Labs    12/05/21 0000 05/28/22 0000  NA 140 140  K 4.4 4.2  CL 109* 107  CO2 24* 23*  BUN 29* 40*  CREATININE 1.4* 1.6*  CALCIUM 8.7 8.3*   Recent Labs    12/05/21 0000  AST 11*  ALT 7  ALKPHOS 49  ALBUMIN 3.9   Recent Labs    12/05/21 0000  WBC 12.2  NEUTROABS 8,747.00  HGB 10.3*  HCT 32*  PLT 268   Lab Results  Component Value Date   TSH 3.56 04/01/2021   Lab Results  Component Value Date   HGBA1C 5.1 02/14/2019   Lab Results  Component Value Date   CHOL 151 08/24/2020   HDL 57 08/24/2020   LDLCALC 70 08/24/2020   TRIG 162 (A) 08/24/2020   CHOLHDL 2.1 06/07/2018    Significant Diagnostic Results in last 30 days:  No results found.  Assessment/Plan  1. Gastroesophageal reflux disease without esophagitis -  will increase Protonix 40 mg from daily to BID - pantoprazole (PROTONIX) 40 MG tablet; Take 1 tablet (40 mg total) by mouth 2 (two) times daily.  Dispense: 60 tablet; Refill: 3  2. Essential hypertension -  BPs stable -  continue Losartan and Metoprolol succinate    Family/ staff Communication: Discussed plan of care with resident and charge nurse  Labs/tests ordered: None    Kenard Gower, DNP, MSN, FNP-BC [facility] [phone] (Monday-Friday 8:00 a.m. - 5:00 p.m.) [phone] (after hours)

## 2022-08-10 ENCOUNTER — Non-Acute Institutional Stay: Payer: Medicare Other | Admitting: Nurse Practitioner

## 2022-08-10 ENCOUNTER — Encounter: Payer: Self-pay | Admitting: Nurse Practitioner

## 2022-08-10 DIAGNOSIS — K580 Irritable bowel syndrome with diarrhea: Secondary | ICD-10-CM | POA: Diagnosis not present

## 2022-08-10 DIAGNOSIS — D631 Anemia in chronic kidney disease: Secondary | ICD-10-CM

## 2022-08-10 DIAGNOSIS — M353 Polymyalgia rheumatica: Secondary | ICD-10-CM | POA: Diagnosis not present

## 2022-08-10 DIAGNOSIS — I10 Essential (primary) hypertension: Secondary | ICD-10-CM

## 2022-08-10 DIAGNOSIS — K219 Gastro-esophageal reflux disease without esophagitis: Secondary | ICD-10-CM | POA: Diagnosis not present

## 2022-08-10 DIAGNOSIS — E785 Hyperlipidemia, unspecified: Secondary | ICD-10-CM

## 2022-08-10 DIAGNOSIS — R350 Frequency of micturition: Secondary | ICD-10-CM

## 2022-08-10 DIAGNOSIS — F339 Major depressive disorder, recurrent, unspecified: Secondary | ICD-10-CM

## 2022-08-10 DIAGNOSIS — E039 Hypothyroidism, unspecified: Secondary | ICD-10-CM

## 2022-08-10 DIAGNOSIS — R2681 Unsteadiness on feet: Secondary | ICD-10-CM

## 2022-08-10 DIAGNOSIS — M159 Polyosteoarthritis, unspecified: Secondary | ICD-10-CM

## 2022-08-10 DIAGNOSIS — R413 Other amnesia: Secondary | ICD-10-CM

## 2022-08-10 DIAGNOSIS — I2510 Atherosclerotic heart disease of native coronary artery without angina pectoris: Secondary | ICD-10-CM

## 2022-08-10 DIAGNOSIS — D519 Vitamin B12 deficiency anemia, unspecified: Secondary | ICD-10-CM

## 2022-08-10 DIAGNOSIS — M8000XA Age-related osteoporosis with current pathological fracture, unspecified site, initial encounter for fracture: Secondary | ICD-10-CM

## 2022-08-10 DIAGNOSIS — N1831 Chronic kidney disease, stage 3a: Secondary | ICD-10-CM

## 2022-08-10 NOTE — Assessment & Plan Note (Signed)
takes Prednisone 5mg  qd(failed GDR), followed by Dr. Kathi Ludwig

## 2022-08-10 NOTE — Assessment & Plan Note (Signed)
Urinary frequency and occasional incontinent of urine, on Myrbetriq. 

## 2022-08-10 NOTE — Assessment & Plan Note (Signed)
takes Ca, Vit D. 04/29/21 Dr. Syed dc Fosamax.  05/07/21 DEXA t score -1.273 

## 2022-08-10 NOTE — Assessment & Plan Note (Signed)
stable, on Venlafaxine 

## 2022-08-10 NOTE — Assessment & Plan Note (Signed)
MMSE 29/30 05/27/22, 07/02/21 CT head w/o CM, No acute abnormality no change from earlier this year. Moderate atrophy and moderate chronic microvascular ischemic change in the white matter. On Memantine.

## 2022-08-10 NOTE — Assessment & Plan Note (Signed)
on Levothyroxine, TSH 3.03 12/04/21 

## 2022-08-10 NOTE — Assessment & Plan Note (Signed)
taking Atorvastatin.  ?

## 2022-08-10 NOTE — Assessment & Plan Note (Signed)
on Isosorbide and Ranolazine, Plavix. LDL 70 08/24/20 

## 2022-08-10 NOTE — Assessment & Plan Note (Signed)
takes Tylenol, Methocarbamol prn, 06/18/22 ortho X-ay R+L hip 

## 2022-08-10 NOTE — Assessment & Plan Note (Signed)
takes Vit B12, Vit B12 162 08/03/19>> Vit B12 869 08/24/20 ?

## 2022-08-10 NOTE — Assessment & Plan Note (Signed)
Blood pressure is controlled, on Metoprolol, Losartan, Bun/creat 40/1.6 05/28/22

## 2022-08-10 NOTE — Assessment & Plan Note (Signed)
uses walker for ambulation, w/c to go further.  

## 2022-08-10 NOTE — Assessment & Plan Note (Signed)
Bun/creat 40/1.6 05/28/22 

## 2022-08-10 NOTE — Assessment & Plan Note (Signed)
uses CPAP 

## 2022-08-10 NOTE — Assessment & Plan Note (Signed)
Started Pantoprazole, desires f/u BMP

## 2022-08-10 NOTE — Progress Notes (Signed)
Location:  [facility] Room Number: AL911/A Place of Service:  ALF [id number]) Provider:  Matheson Vandehei X, NP  Patient Care Team: Sanyla Summey X, NP as PCP - General (Internal Medicine) Corky Crafts, MD as PCP - Cardiology (Cardiology) Charlott Rakes, MD as Consulting Physician (Gastroenterology) August Saucer Corrie Mckusick, MD as Consulting Physician (Orthopedic Surgery) Levert Feinstein, MD as Consulting Physician (Neurology) Marcine Matar, MD as Consulting Physician (Urology) Donalee Citrin, MD as Consulting Physician (Neurosurgery)  Extended Emergency Contact Information Primary Emergency Contact: Darlin Priestly Address: [address]          [place] [address] [place] Home Phone: [phone] Mobile Phone: [phone] Relation: Spouse Secondary Emergency Contact: Rainville,NATHAN Mobile Phone: [phone] Relation: Son  Code Status:  DNR Goals of care: Advanced Directive information    08/10/2022   11:01 AM  Advanced Directives  Does Patient Have a Medical Advance Directive? Yes  Type of Advance Directive Living will;Healthcare Power of [place];Out of facility DNR (pink MOST or yellow form)  Does patient want to make changes to medical advance directive? No - Patient declined  Copy of Healthcare Power of Attorney in Chart? Yes - validated most recent copy scanned in chart (See row information)  Pre-existing out of facility DNR order (yellow form or pink MOST form) Pink MOST form placed in chart (order not valid for inpatient use);Yellow form placed in chart (order not valid for inpatient use)     Chief Complaint  Patient presents with  . Acute Visit    Patient is being seen for constipation    HPI:  Pt is a 82 y.o. female seen today for an acute visit for constipation.    GERD, started Pantoprazole for symptomatic management, prn Maalox, 06/05/22 esophogram barium swallow study, dysmotility  PMR, takes Prednisone 5mg  qd(failed  GDR), followed by Dr. Kathi Ludwig HTN, on Metoprolol, Losartan, Bun/creat 40/1.6 05/28/22             Memory loss: MMSE  29/38 05/27/22, 07/02/21 CT head w/o CM, No acute abnormality no change from earlier this year. Moderate atrophy and moderate chronic microvascular ischemic change in the white matter. On Memantine.               Gait abnormality, uses walker for ambulation, w/c to go further.  CKD Bun/creat 40/1.6 05/28/22             CAD, on Isosorbide and Ranolazine, Plavix. LDL 70 08/24/20             Hypothyroidism, on Levothyroxine, TSH 3.03 12/04/21             Anemia, on B12, Iron 47 05/22/21, Hgb 10.3 12/05/21             Urinary frequency and occasional incontinent of urine, on Myrbetriq.             Depression, stable, on Venlafaxine             OA, takes Tylenol, Methocarbamol prn, 06/18/22 ortho X-ay R+L hip             OP, takes Ca, Vit D. 04/29/21 Dr. Kathi Ludwig dc Fosamax.  05/07/21 DEXA t score -1.273             Vit B12 deficiency, takes Vit B12, Vit B12 162 08/03/19>> Vit B12 869 08/24/20             Hyperlipidemia, taking Atorvastatin.  OSA, uses CPAP     Past Medical History:  Diagnosis Date  . Anemia   . Arthritis    "fingers, back, shoulders, hips" (04/10/2016)  . Chronic diastolic CHF (congestive heart failure) (HCC)    a. normal EF, LVEDP at [facility] in 6/17 33 >> Lasix started   . CKD (chronic kidney disease), stage III (HCC)   . Coronary artery disease    a. s/p CABG 2000 (SVG/ free LIMA Y graft to the diagonal and distal LAD, SVG to the OM 1, and SVG to the PDA) . b. Cath 12/19/2014 90% dSVG to RCA s/p 2 overlapping DES. c. LHC 2016, 2017 no intervention except diuresis needed. d. Low risk nuc 03/2016. e. Cath 12/2017 patent LIMA-LAD, SVG-OM, SVG-PDA but occluded SVG to diag.   . Depression    with anxious component  . GERD (gastroesophageal reflux disease)   . History of hiatal hernia   . Hyperlipidemia   . Hypothyroidism   . Obesity   . OSA on CPAP   . PMR (polymyalgia  rheumatica) (HCC)   . Pneumonia    "2-3 times" (04/10/2016)  . Stable angina    microvascular, improved with Ranexa  . Subclavian artery stenosis (HCC)    a. L by cath note in 2016.  Marland Kitchen TIA (transient ischemic attack)    Past Surgical History:  Procedure Laterality Date  . BREAST BIOPSY Right 1964  . CARDIAC CATHETERIZATION  08   patent grafts, no culprit lesions, EF 65%  . CARDIAC CATHETERIZATION N/A 12/19/2014   Procedure: Left Heart Cath and Cors/Grafts Angiography;  Surgeon: Corky Crafts, MD;  Location: [facility] INVASIVE CV LAB;  Service: Cardiovascular;  Laterality: N/A;  . CARDIAC CATHETERIZATION N/A 12/19/2014   Procedure: Coronary Stent Intervention;  Surgeon: Corky Crafts, MD;  Location: [facility] INVASIVE CV LAB;  Service: Cardiovascular;  Laterality: N/A;  . CARDIAC CATHETERIZATION N/A 01/01/2015   Procedure: Left Heart Cath and Cors/Grafts Angiography;  Surgeon: Corky Crafts, MD;  Location: [facility] INVASIVE CV LAB;  Service: Cardiovascular;  Laterality: N/A;  . CARDIAC CATHETERIZATION N/A 09/20/2015   Procedure: Left Heart Cath and Cors/Grafts Angiography;  Surgeon: Corky Crafts, MD;  Location: [facility] INVASIVE CV LAB;  Service: Cardiovascular;  Laterality: N/A;  . CATARACT EXTRACTION W/ INTRAOCULAR LENS  IMPLANT, BILATERAL Bilateral 2011  . COLONOSCOPY WITH PROPOFOL N/A 07/27/2016   Procedure: COLONOSCOPY WITH PROPOFOL;  Surgeon: Charlott Rakes, MD;  Location: [facility] ENDOSCOPY;  Service: Endoscopy;  Laterality: N/A;  . CORONARY ARTERY BYPASS GRAFT  2000   ASCVD, multivessel, S./P.  . DILATION AND CURETTAGE OF UTERUS  1980s  . ESOPHAGOGASTRODUODENOSCOPY (EGD) WITH PROPOFOL N/A 07/27/2016   Procedure: ESOPHAGOGASTRODUODENOSCOPY (EGD) WITH PROPOFOL;  Surgeon: Charlott Rakes, MD;  Location: [facility] ENDOSCOPY;  Service: Endoscopy;  Laterality: N/A;  . FRACTURE SURGERY    . LEFT HEART CATH AND CORS/GRAFTS ANGIOGRAPHY N/A 01/12/2018   Procedure: LEFT HEART CATH AND CORS/GRAFTS  ANGIOGRAPHY;  Surgeon: Corky Crafts, MD;  Location: [facility] INVASIVE CV LAB;  Service: Cardiovascular;  Laterality: N/A;  . PATELLA FRACTURE SURGERY Left 1993    Allergies  Allergen Reactions  . Nsaids Other (See Comments)    Due to chronic kidney failure  . Butorphanol Other (See Comments)    agitation Constipation Produced a lot of urine, made patient feel crazy  . Sulfa Antibiotics Rash  . Bisoprolol Fumarate Other (See Comments)    Doesn't remember  . Butorphanol Tartrate Other (See Comments)    Produced a lot  of urine, made patient feel crazy  . Codeine Nausea And Vomiting  . Demerol [Meperidine] Nausea And Vomiting  . Imipramine     Sweating, facial dysfunction   . Meperidine And Related Nausea And Vomiting  . Statins     MYALGIAS  . Tequin [Gatifloxacin] Other (See Comments)    Caused hypoglycemia Low blood sugar  . Brilinta [Ticagrelor] Rash    CAUSES PETECHIAE, PURPURA  . Pseudoephedrine Hcl Palpitations  . Septra [Sulfamethoxazole-Trimethoprim] Rash  . Sulfamethoxazole-Trimethoprim Rash    Outpatient Encounter Medications as of 08/10/2022  Medication Sig  . acetaminophen (TYLENOL) 500 MG tablet Take 1,000 mg by mouth 2 (two) times daily as needed.  Marland Kitchen alum & mag hydroxide-simeth (MAALOX PLUS) 400-400-40 MG/5ML suspension Take 10 mLs by mouth as needed for indigestion.  Marland Kitchen atorvastatin (LIPITOR) 10 MG tablet TAKE 1 TABLET ONCE DAILY.  . Calcium-Phosphorus-Vitamin D (CALCIUM/VITAMIN D3/ADULT GUMMY) 250-100-500 MG-MG-UNIT CHEW Chew 1 Piece of gum by mouth 2 (two) times daily.  . Cholecalciferol (VITAMIN D) 50 MCG (2000 UT) tablet Take 2,000 Units by mouth daily.  . clopidogrel (PLAVIX) 75 MG tablet TAKE 1 TABLET ONCE DAILY.  Marland Kitchen COENZYME Q-10 PO Take 10 mg by mouth at bedtime.  . cyanocobalamin 1000 MCG tablet Take 1,000 mcg by mouth daily.  . fluticasone (FLONASE) 50 MCG/ACT nasal spray Place 2 sprays into both nostrils daily as needed for allergies.   . furosemide  (LASIX) 20 MG tablet Take 20 mg by mouth daily.  . isosorbide mononitrate (IMDUR) 60 MG 24 hr tablet Take 2 tablets (120 mg total) by mouth daily.  Marland Kitchen ketoconazole (NIZORAL) 2 % shampoo Apply 1 Application topically 2 (two) times a week. Tuesday and Friday  . levocetirizine (XYZAL) 5 MG tablet TAKE 1 TABLET ONCE DAILY IN THE EVENING.  Marland Kitchen levothyroxine (SYNTHROID) 75 MCG tablet TAKE 1 TABLET ONCE DAILY ON EMPTY STOMACH.  Marland Kitchen losartan (COZAAR) 25 MG tablet Take 25 mg by mouth daily.  . melatonin 1 MG TABS tablet Take 1 mg by mouth at bedtime.  . memantine (NAMENDA) 10 MG tablet Take 10 mg by mouth 2 (two) times daily.  . Menthol, Topical Analgesic, (BIOFREEZE) 10 % LIQD Apply topically as needed.  . methocarbamol (ROBAXIN) 750 MG tablet Take 750 mg by mouth every 6 (six) hours as needed for muscle spasms.  . metoprolol succinate (TOPROL-XL) 25 MG 24 hr tablet Take 25 mg by mouth daily. Take with or immediately following a meal.  . miconazole (MICOTIN) 2 % powder Apply topically 2 (two) times daily.  Apply to groin and pannus topically  . MYRBETRIQ 50 MG TB24 tablet TAKE 1 TABLET BY MOUTH DAILY.  . nitroGLYCERIN (NITROSTAT) 0.4 MG SL tablet Place 0.4 mg under the tongue every 5 (five) minutes as needed for chest pain. X 3 doses  . pantoprazole (PROTONIX) 40 MG tablet Take 1 tablet (40 mg total) by mouth 2 (two) times daily.  . polyethylene glycol (MIRALAX / GLYCOLAX) 17 g packet Take 17 g by mouth daily.  . potassium chloride (MICRO-K) 10 MEQ CR capsule Take 20 mEq by mouth 2 (two) times daily.  . predniSONE (DELTASONE) 5 MG tablet Take 5 mg by mouth daily.  . PSYLLIUM HUSK PO Take 0.4 g by mouth daily. May keep at bedside  . ranolazine (RANEXA) 1000 MG SR tablet TAKE 1 TABLET BY MOUTH TWICE DAILY.  Marland Kitchen venlafaxine (EFFEXOR) 75 MG tablet Take 150 mg by mouth. 2 caps= 150 mg; oral  Once A Day  .  zinc oxide 20 % ointment Apply 1 application topically as needed for irritation.   No facility-administered  encounter medications on file as of 08/10/2022.    Review of Systems  Constitutional:  Negative for activity change, appetite change and fever.  HENT:  Positive for hearing loss. Negative for congestion and voice change.   Eyes:  Negative for visual disturbance.  Respiratory:  Positive for shortness of breath. Negative for cough and wheezing.   Cardiovascular:  Positive for leg swelling.  Gastrointestinal:  Positive for constipation. Negative for abdominal pain.       Heart burn, indigestion.   Genitourinary:  Negative for dysuria, frequency and urgency.       1-2x/night bathroom trips.   Musculoskeletal:  Positive for arthralgias, gait problem and myalgias.  Skin:  Negative for color change.  Neurological:  Negative for speech difficulty, weakness and headaches.       Memory lapses.   Psychiatric/Behavioral:  Negative for behavioral problems, hallucinations and sleep disturbance. The patient is not nervous/anxious.     Immunization History  Administered Date(s) Administered  . Fluad Quad(high Dose 65+) 01/12/2022  . HPV Bivalent 11/24/2013, 01/17/2016  . Influenza Whole 12/23/2017  . Influenza, High Dose Seasonal PF 12/24/2018, 12/24/2018  . Influenza-Unspecified 01/10/2013, 12/27/2014, 12/30/2016, 01/02/2020, 01/09/2021  . Moderna SARS-COV2 Booster Vaccination 08/08/2021  . Moderna Sars-Covid-2 Vaccination 03/25/2019, 04/22/2019, 01/30/2020, 08/20/2020, 12/10/2020  . [employer] 38yrs & up 01/22/2022  . Pneumococcal Conjugate-13 04/21/2013  . Pneumococcal Polysaccharide-23 02/27/1993, 03/23/2009  . RSV,unspecified 05/25/2022  . Tdap 04/20/2012  . Zoster Recombinat (Shingrix) 03/29/2021, 06/09/2021  . Zoster, Live 11/10/2010   Pertinent  Health Maintenance Due  Topic Date Due  . INFLUENZA VACCINE  10/22/2022  . DEXA SCAN  Completed      03/27/2021    9:56 PM 03/31/2022   10:18 AM 04/03/2022    2:08 PM 07/13/2022    9:50 AM 08/10/2022   11:01 AM   Fall Risk  Falls in the past year?  0 0 0 0  Was there an injury with Fall?  0 0 0 0  Fall Risk Category Calculator  0 0 0 0  Fall Risk Category (Retired)  Low Low    (RETIRED) Patient Fall Risk Level Moderate fall risk Low fall risk Low fall risk    Patient at Risk for Falls Due to  No Fall Risks No Fall Risks  No Fall Risks  Fall risk Follow up  Falls evaluation completed Falls evaluation completed Falls evaluation completed Falls evaluation completed   Functional Status Survey:    Vitals:   08/10/22 1103  BP: 132/60  Pulse: 71  Resp: 20  Temp: 97.8 F (36.6 C)  SpO2: 96%  Weight: 189 lb (85.7 kg)  Height: 4\' 10"  (1.473 m)   Body mass index is 39.5 kg/m. Physical Exam Vitals and nursing note reviewed.  Constitutional:      Appearance: Normal appearance.  HENT:     Head: Normocephalic and atraumatic.     Mouth/Throat:     Mouth: Mucous membranes are moist.  Eyes:     Extraocular Movements: Extraocular movements intact.     Conjunctiva/sclera: Conjunctivae normal.     Pupils: Pupils are equal, round, and reactive to light.  Cardiovascular:     Rate and Rhythm: Normal rate and regular rhythm.     Heart sounds:     No gallop.  Pulmonary:     Effort: Pulmonary effort is normal.  Breath sounds: No rales.  Abdominal:     General: Bowel sounds are normal.     Palpations: Abdomen is soft.     Tenderness: There is no abdominal tenderness.  Musculoskeletal:        General: No tenderness.     Cervical back: Normal range of motion and neck supple.     Right lower leg: Edema present.     Left lower leg: Edema present.     Comments: Trace edema RLE, fingers. Aching muscle in thighs.   Skin:    General: Skin is warm and dry.  Neurological:     General: No focal deficit present.     Mental Status: She is alert and oriented to person, place, and time. Mental status is at baseline.     Gait: Gait abnormal.  Psychiatric:        Mood and Affect: Mood normal.         Behavior: Behavior normal.        Thought Content: Thought content normal.        Judgment: Judgment normal.    Labs reviewed: Recent Labs    12/05/21 0000 05/28/22 0000  NA 140 140  K 4.4 4.2  CL 109* 107  CO2 24* 23*  BUN 29* 40*  CREATININE 1.4* 1.6*  CALCIUM 8.7 8.3*   Recent Labs    12/05/21 0000  AST 11*  ALT 7  ALKPHOS 49  ALBUMIN 3.9   Recent Labs    12/05/21 0000  WBC 12.2  NEUTROABS 8,747.00  HGB 10.3*  HCT 32*  PLT 268   Lab Results  Component Value Date   TSH 3.56 04/01/2021   Lab Results  Component Value Date   HGBA1C 5.1 02/14/2019   Lab Results  Component Value Date   CHOL 151 08/24/2020   HDL 57 08/24/2020   LDLCALC 70 08/24/2020   TRIG 162 (A) 08/24/2020   CHOLHDL 2.1 06/07/2018    Significant Diagnostic Results in last 30 days:  No results found.  Assessment/Plan Irritable bowel syndrome with diarrhea C/o constipation, thinks recent started Pantoprazole was contributory, declined MiraLax qd, desirs prn Colace, pending GI eval, observe, update CBC/diff, CMP/eGFR  GERD (gastroesophageal reflux disease) Started Pantoprazole, desires f/u BMP  Polymyalgia rheumatica (HCC)  takes Prednisone 5mg  qd(failed GDR), followed by Dr. Kathi Ludwig  Essential hypertension Blood pressure is controlled, on Metoprolol, Losartan, Bun/creat 40/1.6 05/28/22  Memory loss  MMSE 29/30 05/27/22, 07/02/21 CT head w/o CM, No acute abnormality no change from earlier this year. Moderate atrophy and moderate chronic microvascular ischemic change in the white matter. On Memantine.   Gait instability  uses walker for ambulation, w/c to go further.   CKD (chronic kidney disease) stage 3, GFR 30-59 ml/min (HCC) Bun/creat 40/1.6 05/28/22  CAD (coronary artery disease) on Isosorbide and Ranolazine, Plavix. LDL 70 08/24/20  Hypothyroidism  on Levothyroxine, TSH 3.03 12/04/21  Anemia due to chronic kidney disease  on Levothyroxine, TSH 3.03 12/04/21  Urinary  frequency Urinary frequency and occasional incontinent of urine, on Myrbetriq.  Depression, recurrent (HCC) stable, on Venlafaxine  Generalized osteoarthritis of multiple sites  takes Tylenol, Methocarbamol prn, 06/18/22 ortho X-ay R+L hip  Osteoporosis takes Ca, Vit D. 04/29/21 Dr. Kathi Ludwig dc Fosamax.  05/07/21 DEXA t score -1.273  Vitamin B12 deficiency anemia  takes Vit B12, Vit B12 162 08/03/19>> Vit B12 869 08/24/20  Hyperlipidemia LDL goal <70  taking Atorvastatin.   Sleep apnea uses CPAP     Family/ staff  Communication: plan of care reviewed with the patient and charge nurse.   Labs/tests ordered: CBC/diff, CMP/eGFR  Time spend 40 minutes.

## 2022-08-10 NOTE — Assessment & Plan Note (Addendum)
C/o constipation, thinks recent started Pantoprazole was contributory, declined MiraLax qd, desirs prn Colace, pending GI eval, observe, update CBC/diff, CMP/eGFR

## 2022-08-13 NOTE — Telephone Encounter (Signed)
[facility]ursing home [facility] called, to update on Transfer of Care. Advised them of recommendation.

## 2022-08-19 ENCOUNTER — Non-Acute Institutional Stay: Payer: Medicare Other | Admitting: Nurse Practitioner

## 2022-08-19 ENCOUNTER — Encounter: Payer: Self-pay | Admitting: Nurse Practitioner

## 2022-08-19 DIAGNOSIS — I10 Essential (primary) hypertension: Secondary | ICD-10-CM | POA: Diagnosis not present

## 2022-08-19 DIAGNOSIS — R413 Other amnesia: Secondary | ICD-10-CM | POA: Diagnosis not present

## 2022-08-19 DIAGNOSIS — R2681 Unsteadiness on feet: Secondary | ICD-10-CM

## 2022-08-19 DIAGNOSIS — D631 Anemia in chronic kidney disease: Secondary | ICD-10-CM

## 2022-08-19 DIAGNOSIS — K219 Gastro-esophageal reflux disease without esophagitis: Secondary | ICD-10-CM

## 2022-08-19 DIAGNOSIS — F339 Major depressive disorder, recurrent, unspecified: Secondary | ICD-10-CM

## 2022-08-19 DIAGNOSIS — I2510 Atherosclerotic heart disease of native coronary artery without angina pectoris: Secondary | ICD-10-CM

## 2022-08-19 DIAGNOSIS — E039 Hypothyroidism, unspecified: Secondary | ICD-10-CM

## 2022-08-19 DIAGNOSIS — N1831 Chronic kidney disease, stage 3a: Secondary | ICD-10-CM

## 2022-08-19 DIAGNOSIS — M353 Polymyalgia rheumatica: Secondary | ICD-10-CM

## 2022-08-19 DIAGNOSIS — R350 Frequency of micturition: Secondary | ICD-10-CM

## 2022-08-19 DIAGNOSIS — G4733 Obstructive sleep apnea (adult) (pediatric): Secondary | ICD-10-CM

## 2022-08-19 DIAGNOSIS — M159 Polyosteoarthritis, unspecified: Secondary | ICD-10-CM

## 2022-08-19 NOTE — Assessment & Plan Note (Signed)
on B12, Iron 47 05/22/21, Hgb 10.3 12/05/21 

## 2022-08-19 NOTE — Assessment & Plan Note (Signed)
MMSE  29/38 05/27/22, 07/02/21 CT head w/o CM, No acute abnormality no change from earlier this year. Moderate atrophy and moderate chronic microvascular ischemic change in the white matter. On Memantine.

## 2022-08-19 NOTE — Assessment & Plan Note (Signed)
uses CPAP 

## 2022-08-19 NOTE — Assessment & Plan Note (Signed)
on Isosorbide and Ranolazine, Plavix. LDL 70 08/24/20 

## 2022-08-19 NOTE — Assessment & Plan Note (Signed)
uses walker for ambulation, w/c to go further.  

## 2022-08-19 NOTE — Assessment & Plan Note (Signed)
Low Bp measurements, decrease Losartan, continue Metoprolol.

## 2022-08-19 NOTE — Assessment & Plan Note (Signed)
stable, on Venlafaxine 

## 2022-08-19 NOTE — Assessment & Plan Note (Signed)
takes Tylenol, Methocarbamol prn, 06/18/22 ortho X-ay R+L hip 

## 2022-08-19 NOTE — Assessment & Plan Note (Signed)
on Levothyroxine, TSH 3.03 12/04/21 

## 2022-08-19 NOTE — Assessment & Plan Note (Signed)
Bun/creat 27/1.34 08/13/22

## 2022-08-19 NOTE — Assessment & Plan Note (Addendum)
persisted GERD symptoms including pain, almost getting up every night with position change to relieve symptoms while was effective initially.    GERD, started Pantoprazole for symptomatic management, prn Maalox, 06/05/22 esophogram barium swallow study, dysmotility   Will try Reglan 5mg  ac meals and hs x 2 weeks, GI referral. Obtain CBC/diff, CMP/eGFR, lipase, Amylase, test stool PCR H pylori.    08/20/22 H pylori ag stool negative.

## 2022-08-19 NOTE — Progress Notes (Addendum)
Location:   [facility] Room Number: 911 Place of Service:  ALF (13) Provider: Arna Snipe Kateri Balch NP  Avishai Reihl X, NP  Patient Care Team: Maclane Holloran X, NP as PCP - General (Internal Medicine) Corky Crafts, MD as PCP - Cardiology (Cardiology) Charlott Rakes, MD as Consulting Physician (Gastroenterology) August Saucer Corrie Mckusick, MD as Consulting Physician (Orthopedic Surgery) Levert Feinstein, MD as Consulting Physician (Neurology) Marcine Matar, MD as Consulting Physician (Urology) Donalee Citrin, MD as Consulting Physician (Neurosurgery)  Extended Emergency Contact Information Primary Emergency Contact: Darlin Priestly Address: [address]          [place] [address] [place] Home Phone: [phone] Mobile Phone: [phone] Relation: Spouse Secondary Emergency Contact: Berling,NATHAN Mobile Phone: [phone] Relation: Son  Code Status: DNR Goals of care: Advanced Directive information    08/10/2022   11:01 AM  Advanced Directives  Does Patient Have a Medical Advance Directive? Yes  Type of Advance Directive Living will;Healthcare Power of [place];Out of facility DNR (pink MOST or yellow form)  Does patient want to make changes to medical advance directive? No - Patient declined  Copy of Healthcare Power of Attorney in Chart? Yes - validated most recent copy scanned in chart (See row information)  Pre-existing out of facility DNR order (yellow form or pink MOST form) Pink MOST form placed in chart (order not valid for inpatient use);Yellow form placed in chart (order not valid for inpatient use)     Chief Complaint  Patient presents with   Acute Visit    Uncontrolled GERD symptoms.     HPI:  Pt is a 82 y.o. female seen today for an acute visit for persisted GERD symptoms including pain, almost getting up every night with position change to relieve symptoms while was effective initially.    GERD, started Pantoprazole for symptomatic  management, prn Maalox, 06/05/22 esophogram barium swallow study, dysmotility             PMR, takes Prednisone 5mg  qd(failed GDR), followed by Dr. Kathi Ludwig HTN, on Metoprolol, Losartan, Bun/creat 40/1.6 05/28/22             Memory loss: MMSE  29/38 05/27/22, 07/02/21 CT head w/o CM, No acute abnormality no change from earlier this year. Moderate atrophy and moderate chronic microvascular ischemic change in the white matter. On Memantine.               Gait abnormality, uses walker for ambulation, w/c to go further.  CKD Bun/creat 17/1.34 08/13/22             CAD, on Isosorbide and Ranolazine, Plavix. LDL 70 08/24/20             Hypothyroidism, on Levothyroxine, TSH 3.03 12/04/21             Anemia, on B12, Iron 47 05/22/21, Hgb 10.3 12/05/21             Urinary frequency and occasional incontinent of urine, on Myrbetriq.             Depression, stable, on Venlafaxine             OA, takes Tylenol, Methocarbamol prn, 06/18/22 ortho X-ay R+L hip             OP, takes Ca, Vit D. 04/29/21 Dr. Kathi Ludwig dc Fosamax.  05/07/21 DEXA t score -1.273             Vit B12 deficiency, takes Vit B12, Vit B12  162 08/03/19>> Vit B12 869 08/24/20             Hyperlipidemia, taking Atorvastatin.              OSA, uses CPAP       Past Medical History:  Diagnosis Date   Anemia    Arthritis    "fingers, back, shoulders, hips" (04/10/2016)   Chronic diastolic CHF (congestive heart failure) (HCC)    a. normal EF, LVEDP at [facility] in 6/17 33 >> Lasix started    CKD (chronic kidney disease), stage III (HCC)    Coronary artery disease    a. s/p CABG 2000 (SVG/ free LIMA Y graft to the diagonal and distal LAD, SVG to the OM 1, and SVG to the PDA) . b. Cath 12/19/2014 90% dSVG to RCA s/p 2 overlapping DES. c. LHC 2016, 2017 no intervention except diuresis needed. d. Low risk nuc 03/2016. e. Cath 12/2017 patent LIMA-LAD, SVG-OM, SVG-PDA but occluded SVG to diag.    Depression    with anxious component   GERD (gastroesophageal reflux disease)     History of hiatal hernia    Hyperlipidemia    Hypothyroidism    Obesity    OSA on CPAP    PMR (polymyalgia rheumatica) (HCC)    Pneumonia    "2-3 times" (04/10/2016)   Stable angina    microvascular, improved with Ranexa   Subclavian artery stenosis (HCC)    a. L by cath note in 2016.   TIA (transient ischemic attack)    Past Surgical History:  Procedure Laterality Date   BREAST BIOPSY Right 1964   CARDIAC CATHETERIZATION  08   patent grafts, no culprit lesions, EF 65%   CARDIAC CATHETERIZATION N/A 12/19/2014   Procedure: Left Heart Cath and Cors/Grafts Angiography;  Surgeon: Corky Crafts, MD;  Location: [facility] INVASIVE CV LAB;  Service: Cardiovascular;  Laterality: N/A;   CARDIAC CATHETERIZATION N/A 12/19/2014   Procedure: Coronary Stent Intervention;  Surgeon: Corky Crafts, MD;  Location: [facility] INVASIVE CV LAB;  Service: Cardiovascular;  Laterality: N/A;   CARDIAC CATHETERIZATION N/A 01/01/2015   Procedure: Left Heart Cath and Cors/Grafts Angiography;  Surgeon: Corky Crafts, MD;  Location: [facility] INVASIVE CV LAB;  Service: Cardiovascular;  Laterality: N/A;   CARDIAC CATHETERIZATION N/A 09/20/2015   Procedure: Left Heart Cath and Cors/Grafts Angiography;  Surgeon: Corky Crafts, MD;  Location: [facility] INVASIVE CV LAB;  Service: Cardiovascular;  Laterality: N/A;   CATARACT EXTRACTION W/ INTRAOCULAR LENS  IMPLANT, BILATERAL Bilateral 2011   COLONOSCOPY WITH PROPOFOL N/A 07/27/2016   Procedure: COLONOSCOPY WITH PROPOFOL;  Surgeon: Charlott Rakes, MD;  Location: [facility] ENDOSCOPY;  Service: Endoscopy;  Laterality: N/A;   CORONARY ARTERY BYPASS GRAFT  2000   ASCVD, multivessel, S./P.   DILATION AND CURETTAGE OF UTERUS  1980s   ESOPHAGOGASTRODUODENOSCOPY (EGD) WITH PROPOFOL N/A 07/27/2016   Procedure: ESOPHAGOGASTRODUODENOSCOPY (EGD) WITH PROPOFOL;  Surgeon: Charlott Rakes, MD;  Location: [facility] ENDOSCOPY;  Service: Endoscopy;  Laterality: N/A;   FRACTURE SURGERY     LEFT HEART CATH  AND CORS/GRAFTS ANGIOGRAPHY N/A 01/12/2018   Procedure: LEFT HEART CATH AND CORS/GRAFTS ANGIOGRAPHY;  Surgeon: Corky Crafts, MD;  Location: [facility] INVASIVE CV LAB;  Service: Cardiovascular;  Laterality: N/A;   PATELLA FRACTURE SURGERY Left 1993    Allergies  Allergen Reactions   Nsaids Other (See Comments)    Due to chronic kidney failure   Butorphanol Other (See Comments)    agitation Constipation Produced a lot  of urine, made patient feel crazy   Sulfa Antibiotics Rash   Bisoprolol Fumarate Other (See Comments)    Doesn't remember   Butorphanol Tartrate Other (See Comments)    Produced a lot of urine, made patient feel crazy   Codeine Nausea And Vomiting   Demerol [Meperidine] Nausea And Vomiting   Imipramine     Sweating, facial dysfunction    Meperidine And Related Nausea And Vomiting   Statins     MYALGIAS   Tequin [Gatifloxacin] Other (See Comments)    Caused hypoglycemia Low blood sugar   Brilinta [Ticagrelor] Rash    CAUSES PETECHIAE, PURPURA   Pseudoephedrine Hcl Palpitations   Septra [Sulfamethoxazole-Trimethoprim] Rash   Sulfamethoxazole-Trimethoprim Rash    Allergies as of 08/19/2022       Reactions   Nsaids Other (See Comments)   Due to chronic kidney failure   Butorphanol Other (See Comments)   agitation Constipation Produced a lot of urine, made patient feel crazy   Sulfa Antibiotics Rash   Bisoprolol Fumarate Other (See Comments)   Doesn't remember   Butorphanol Tartrate Other (See Comments)   Produced a lot of urine, made patient feel crazy   Codeine Nausea And Vomiting   Demerol [meperidine] Nausea And Vomiting   Imipramine    Sweating, facial dysfunction    Meperidine And Related Nausea And Vomiting   Statins    MYALGIAS   Tequin [gatifloxacin] Other (See Comments)   Caused hypoglycemia Low blood sugar   Brilinta [ticagrelor] Rash   CAUSES PETECHIAE, PURPURA   Pseudoephedrine Hcl Palpitations   Septra [sulfamethoxazole-trimethoprim]  Rash   Sulfamethoxazole-trimethoprim Rash        Medication List        Accurate as of Aug 19, 2022 11:59 PM. If you have any questions, ask your nurse or doctor.          acetaminophen 500 MG tablet Commonly known as: TYLENOL Take 1,000 mg by mouth 2 (two) times daily as needed.   alum & mag hydroxide-simeth 400-400-40 MG/5ML suspension Commonly known as: MAALOX PLUS Take 10 mLs by mouth as needed for indigestion.   atorvastatin 10 MG tablet Commonly known as: LIPITOR TAKE 1 TABLET ONCE DAILY.   Biofreeze 10 % Liqd Generic drug: Menthol (Topical Analgesic) Apply topically as needed.   Calcium/Vitamin D3/Adult Gummy 250-100-500 MG-MG-UNIT Chew Generic drug: Calcium-Phosphorus-Vitamin D Chew 1 Piece of gum by mouth 2 (two) times daily.   clopidogrel 75 MG tablet Commonly known as: PLAVIX TAKE 1 TABLET ONCE DAILY.   COENZYME Q-10 PO Take 10 mg by mouth at bedtime.   cyanocobalamin 1000 MCG tablet Take 1,000 mcg by mouth daily.   fluticasone 50 MCG/ACT nasal spray Commonly known as: FLONASE Place 2 sprays into both nostrils daily as needed for allergies.   furosemide 20 MG tablet Commonly known as: LASIX Take 20 mg by mouth daily.   isosorbide mononitrate 60 MG 24 hr tablet Commonly known as: IMDUR Take 2 tablets (120 mg total) by mouth daily.   ketoconazole 2 % shampoo Commonly known as: NIZORAL Apply 1 Application topically 2 (two) times a week. Tuesday and Friday   levocetirizine 5 MG tablet Commonly known as: XYZAL TAKE 1 TABLET ONCE DAILY IN THE EVENING.   levothyroxine 75 MCG tablet Commonly known as: SYNTHROID TAKE 1 TABLET ONCE DAILY ON EMPTY STOMACH.   losartan 25 MG tablet Commonly known as: COZAAR Take 25 mg by mouth daily.   melatonin 1 MG Tabs tablet Take 1 mg  by mouth at bedtime.   memantine 10 MG tablet Commonly known as: NAMENDA Take 10 mg by mouth 2 (two) times daily.   methocarbamol 750 MG tablet Commonly known as:  ROBAXIN Take 750 mg by mouth every 6 (six) hours as needed for muscle spasms.   metoprolol succinate 25 MG 24 hr tablet Commonly known as: TOPROL-XL Take 25 mg by mouth daily. Take with or immediately following a meal.   miconazole 2 % powder Commonly known as: MICOTIN Apply topically 2 (two) times daily.  Apply to groin and pannus topically   Myrbetriq 50 MG Tb24 tablet Generic drug: mirabegron ER TAKE 1 TABLET BY MOUTH DAILY.   nitroGLYCERIN 0.4 MG SL tablet Commonly known as: NITROSTAT Place 0.4 mg under the tongue every 5 (five) minutes as needed for chest pain. X 3 doses   pantoprazole 40 MG tablet Commonly known as: PROTONIX Take 1 tablet (40 mg total) by mouth 2 (two) times daily.   polyethylene glycol 17 g packet Commonly known as: MIRALAX / GLYCOLAX Take 17 g by mouth daily.   potassium chloride 10 MEQ CR capsule Commonly known as: MICRO-K Take 20 mEq by mouth 2 (two) times daily.   predniSONE 5 MG tablet Commonly known as: DELTASONE Take 5 mg by mouth daily.   PSYLLIUM HUSK PO Take 0.4 g by mouth daily. May keep at bedside   ranolazine 1000 MG SR tablet Commonly known as: RANEXA TAKE 1 TABLET BY MOUTH TWICE DAILY.   venlafaxine 75 MG tablet Commonly known as: EFFEXOR Take 150 mg by mouth. 2 caps= 150 mg; oral  Once A Day   Vitamin D 50 MCG (2000 UT) tablet Take 2,000 Units by mouth daily.   zinc oxide 20 % ointment Apply 1 application topically as needed for irritation.        Review of Systems  Constitutional:  Negative for activity change, appetite change and fever.  HENT:  Positive for hearing loss. Negative for congestion and voice change.   Eyes:  Negative for visual disturbance.  Respiratory:  Positive for shortness of breath. Negative for cough and wheezing.   Cardiovascular:  Positive for leg swelling.  Gastrointestinal:  Positive for constipation. Negative for abdominal pain.       Heart burn, indigestion.   Genitourinary:  Negative  for dysuria, frequency and urgency.       1-2x/night bathroom trips.   Musculoskeletal:  Positive for arthralgias, gait problem and myalgias.  Skin:  Negative for color change.  Neurological:  Negative for speech difficulty, weakness and headaches.       Memory lapses.   Psychiatric/Behavioral:  Negative for behavioral problems, hallucinations and sleep disturbance. The patient is not nervous/anxious.     Immunization History  Administered Date(s) Administered   Fluad Quad(high Dose 65+) 01/12/2022   HPV Bivalent 11/24/2013, 01/17/2016   Influenza Whole 12/23/2017   Influenza, High Dose Seasonal PF 12/24/2018, 12/24/2018   Influenza-Unspecified 01/10/2013, 12/27/2014, 12/30/2016, 01/02/2020, 01/09/2021   Moderna SARS-COV2 Booster Vaccination 08/08/2021   Moderna Sars-Covid-2 Vaccination 03/25/2019, 04/22/2019, 01/30/2020, 08/20/2020, 12/10/2020   Pfizer Covid-19 Vaccine Bivalent Booster 67yrs & up 01/22/2022   Pneumococcal Conjugate-13 04/21/2013   Pneumococcal Polysaccharide-23 02/27/1993, 03/23/2009   RSV,unspecified 05/25/2022   Tdap 04/20/2012   Zoster Recombinat (Shingrix) 03/29/2021, 06/09/2021   Zoster, Live 11/10/2010   Pertinent  Health Maintenance Due  Topic Date Due   INFLUENZA VACCINE  10/22/2022   DEXA SCAN  Completed      03/27/2021    9:56  PM 03/31/2022   10:18 AM 04/03/2022    2:08 PM 07/13/2022    9:50 AM 08/10/2022   11:01 AM  Fall Risk  Falls in the past year?  0 0 0 0  Was there an injury with Fall?  0 0 0 0  Fall Risk Category Calculator  0 0 0 0  Fall Risk Category (Retired)  Low Low    (RETIRED) Patient Fall Risk Level Moderate fall risk Low fall risk Low fall risk    Patient at Risk for Falls Due to  No Fall Risks No Fall Risks  No Fall Risks  Fall risk Follow up  Falls evaluation completed Falls evaluation completed Falls evaluation completed Falls evaluation completed   Functional Status Survey:    Vitals:   08/19/22 1032  BP: (!) 119/53  Pulse:  65  Resp: 18  Temp: 97.8 F (36.6 C)  SpO2: 96%  Weight: 189 lb (85.7 kg)   Body mass index is 39.5 kg/m. Physical Exam Vitals and nursing note reviewed.  Constitutional:      Appearance: Normal appearance.  HENT:     Head: Normocephalic and atraumatic.     Mouth/Throat:     Mouth: Mucous membranes are moist.  Eyes:     Extraocular Movements: Extraocular movements intact.     Conjunctiva/sclera: Conjunctivae normal.     Pupils: Pupils are equal, round, and reactive to light.  Cardiovascular:     Rate and Rhythm: Normal rate and regular rhythm.     Heart sounds:     No gallop.  Pulmonary:     Effort: Pulmonary effort is normal.     Breath sounds: No rales.  Abdominal:     General: Bowel sounds are normal.     Palpations: Abdomen is soft.     Tenderness: There is no abdominal tenderness.  Musculoskeletal:        General: No tenderness.     Cervical back: Normal range of motion and neck supple.     Right lower leg: Edema present.     Left lower leg: Edema present.     Comments: Trace edema RLE, fingers. Aching muscle in thighs.   Skin:    General: Skin is warm and dry.  Neurological:     General: No focal deficit present.     Mental Status: She is alert and oriented to person, place, and time. Mental status is at baseline.     Gait: Gait abnormal.  Psychiatric:        Mood and Affect: Mood normal.        Behavior: Behavior normal.        Thought Content: Thought content normal.        Judgment: Judgment normal.     Labs reviewed: Recent Labs    12/05/21 0000 05/28/22 0000  NA 140 140  K 4.4 4.2  CL 109* 107  CO2 24* 23*  BUN 29* 40*  CREATININE 1.4* 1.6*  CALCIUM 8.7 8.3*   Recent Labs    12/05/21 0000  AST 11*  ALT 7  ALKPHOS 49  ALBUMIN 3.9   Recent Labs    12/05/21 0000  WBC 12.2  NEUTROABS 8,747.00  HGB 10.3*  HCT 32*  PLT 268   Lab Results  Component Value Date   TSH 3.56 04/01/2021   Lab Results  Component Value Date   HGBA1C  5.1 02/14/2019   Lab Results  Component Value Date   CHOL 151 08/24/2020   HDL  57 08/24/2020   LDLCALC 70 08/24/2020   TRIG 162 (A) 08/24/2020   CHOLHDL 2.1 06/07/2018    Significant Diagnostic Results in last 30 days:  No results found.  Assessment/Plan: GERD (gastroesophageal reflux disease)   persisted GERD symptoms including pain, almost getting up every night with position change to relieve symptoms while was effective initially.    GERD, started Pantoprazole for symptomatic management, prn Maalox, 06/05/22 esophogram barium swallow study, dysmotility   Will try Reglan 5mg  ac meals and hs x 2 weeks, GI referral. Obtain CBC/diff, CMP/eGFR, lipase, Amylase, test stool PCR H pylori.    08/20/22 H pylori ag stool negative.   Polymyalgia rheumatica (HCC)  takes Prednisone 5mg  qd(failed GDR), followed by Dr. Kathi Ludwig  Essential hypertension Low Bp measurements, decrease Losartan, continue Metoprolol.   Memory loss MMSE  29/38 05/27/22, 07/02/21 CT head w/o CM, No acute abnormality no change from earlier this year. Moderate atrophy and moderate chronic microvascular ischemic change in the white matter. On Memantine.   Gait instability uses walker for ambulation, w/c to go further.   CKD (chronic kidney disease) stage 3, GFR 30-59 ml/min (HCC) Bun/creat 27/1.34 08/13/22  CAD (coronary artery disease)  on Isosorbide and Ranolazine, Plavix. LDL 70 08/24/20  Hypothyroidism on Levothyroxine, TSH 3.03 12/04/21  Anemia due to chronic kidney disease on B12, Iron 47 05/22/21, Hgb 10.3 12/05/21  Urinary frequency Urinary frequency and occasional incontinent of urine, on Myrbetriq.  Depression, recurrent (HCC)  stable, on Venlafaxine  Generalized osteoarthritis of multiple sites  takes Tylenol, Methocarbamol prn, 06/18/22 ortho X-ay R+L hip  Sleep apnea  uses CPAP    Family/ staff Communication: plan of care reviewed with the patient and charge nurse   Labs/tests ordered:   CBC/diff, CMP/eGFR, lipase, amylase, test stool PCR H pylori  Time spend 40 minutes.

## 2022-08-19 NOTE — Assessment & Plan Note (Signed)
takes Prednisone 5mg qd(failed GDR), followed by Dr. Syed 

## 2022-08-19 NOTE — Assessment & Plan Note (Signed)
Urinary frequency and occasional incontinent of urine, on Myrbetriq. 

## 2022-08-23 NOTE — Progress Notes (Unsigned)
Cardiology Office Note:    Date:  08/24/2022   ID:  Erin Ingram, DOB [birth year], MRN [id number]  PCP:  Mast, Man X, NP   [facility] Providers Cardiologist:  Lance Muss, MD     Referring MD: Mast, Man X, NP   Chief Complaint: follow-up CAD   History of Present Illness:    Erin Ingram is a pleasant 82 y.o. female with a hx of CAD s/p CABG 2000, polymyalgia rheumatica, TIA, OSA on CPAP, HTN, hypothyroidism, HLD, CKD, left subclavian artery stenosis, chronic HFpEF, and morbid obestiy.   History of CAD with CABG 2000 with SVG/free LIMA Y graft to diagonal and distal LAD, SVG to OM1 and SVG to PDA. She underwent 2 DES to SVG to RCA September 2016.  Cath in 2017 showed stable anatomy with occluded SVG-diagonal and she had low risk Myoview in 2018. On 01/11/2018 she presented to hospital with a weeks worth of intermittent chest pain, left arm pain, and back pain, mostly at night when at rest. She was admitted with troponins cycled and negative x 3. Given her symptoms she was set up for cardiac cath 01/12/18 noted below with patent LIMA to LAD, SVG to OM, SVG to PDA, but occluded SVG to diagonal.  Normal LVEDP and EF noted at 55 to 65%.  Recommendation to continue medical therapy. Pain in shoulder resolved with PT.  She previously stopped aspirin due to easy bruising.  The diagnosis of left subclavian stenosis gnosis comes from cath report 11/2014 indicated left subclavian stenosis prevented left radial access to the aorta.  Carotid duplex 2017 showed no significant findings.  She was encouraged to work on mostly plant-based eating for weight loss and CV risk reduction and return for follow-up in 1 year.  She was hospitalized 07/2018 from lightheadedness.  She did nearly passed out.  BP meds were decreased.  Seen in ER for fall 04/28/21.  CT head negative.  Active DNR order noted.  Last cardiology clinic visit was 04/08/2021 with Dr. Eldridge Dace.  She was on clopidogrel monotherapy  with lab work that revealed anemia with hgb 9.8. No blood in stool. She did not tolerate high potency statin 2/2 myalgias. She was advised to return in 1 year for follow-up.  Today, she is here alone for follow-up. Uses a rolling walker and ambulates very slowly. Is living at [facility]. Reports burning in her abdomen and pain in between the breasts that she thinks is secondary to hiatal hernia. Just started pantoprazole and thinks it may be slightly better. Awaiting referral to GI. Reports shortness of breath with walking, has been consistent for as long as she can remember. Uses the Nu-Step for exercise for 20-30 minutes some days. Does not go out of her room much. Pain in right leg since she had PMR. Likes to read and listen to podcasts, likes to be alone. Her husband died in 2022-04-28. She denies chest pain, palpitations, orthopnea, PND, presyncope, syncope.   Past Medical History:  Diagnosis Date   Anemia    Arthritis    "fingers, back, shoulders, hips" (04/10/2016)   Chronic diastolic CHF (congestive heart failure) (HCC)    a. normal EF, LVEDP at [facility] in 6/17 33 >> Lasix started    CKD (chronic kidney disease), stage III (HCC)    Coronary artery disease    a. s/p CABG 2000 (SVG/ free LIMA Y graft to the diagonal and distal LAD, SVG to the OM 1, and SVG to the PDA) .  b. Cath 12/19/2014 90% dSVG to RCA s/p 2 overlapping DES. c. LHC 2016, 2017 no intervention except diuresis needed. d. Low risk nuc 03/2016. e. Cath 12/2017 patent LIMA-LAD, SVG-OM, SVG-PDA but occluded SVG to diag.    Depression    with anxious component   GERD (gastroesophageal reflux disease)    History of hiatal hernia    Hyperlipidemia    Hypothyroidism    Obesity    OSA on CPAP    PMR (polymyalgia rheumatica) (HCC)    Pneumonia    "2-3 times" (04/10/2016)   Stable angina    microvascular, improved with Ranexa   Subclavian artery stenosis (HCC)    a. L by cath note in 2016.   TIA (transient ischemic attack)     Past  Surgical History:  Procedure Laterality Date   BREAST BIOPSY Right 1964   CARDIAC CATHETERIZATION  08   patent grafts, no culprit lesions, EF 65%   CARDIAC CATHETERIZATION N/A 12/19/2014   Procedure: Left Heart Cath and Cors/Grafts Angiography;  Surgeon: Corky Crafts, MD;  Location: [facility] INVASIVE CV LAB;  Service: Cardiovascular;  Laterality: N/A;   CARDIAC CATHETERIZATION N/A 12/19/2014   Procedure: Coronary Stent Intervention;  Surgeon: Corky Crafts, MD;  Location: [facility] INVASIVE CV LAB;  Service: Cardiovascular;  Laterality: N/A;   CARDIAC CATHETERIZATION N/A 01/01/2015   Procedure: Left Heart Cath and Cors/Grafts Angiography;  Surgeon: Corky Crafts, MD;  Location: [facility] INVASIVE CV LAB;  Service: Cardiovascular;  Laterality: N/A;   CARDIAC CATHETERIZATION N/A 09/20/2015   Procedure: Left Heart Cath and Cors/Grafts Angiography;  Surgeon: Corky Crafts, MD;  Location: [facility] INVASIVE CV LAB;  Service: Cardiovascular;  Laterality: N/A;   CATARACT EXTRACTION W/ INTRAOCULAR LENS  IMPLANT, BILATERAL Bilateral 2011   COLONOSCOPY WITH PROPOFOL N/A 07/27/2016   Procedure: COLONOSCOPY WITH PROPOFOL;  Surgeon: Charlott Rakes, MD;  Location: [facility] ENDOSCOPY;  Service: Endoscopy;  Laterality: N/A;   CORONARY ARTERY BYPASS GRAFT  2000   ASCVD, multivessel, S./P.   DILATION AND CURETTAGE OF UTERUS  1980s   ESOPHAGOGASTRODUODENOSCOPY (EGD) WITH PROPOFOL N/A 07/27/2016   Procedure: ESOPHAGOGASTRODUODENOSCOPY (EGD) WITH PROPOFOL;  Surgeon: Charlott Rakes, MD;  Location: [facility] ENDOSCOPY;  Service: Endoscopy;  Laterality: N/A;   FRACTURE SURGERY     LEFT HEART CATH AND CORS/GRAFTS ANGIOGRAPHY N/A 01/12/2018   Procedure: LEFT HEART CATH AND CORS/GRAFTS ANGIOGRAPHY;  Surgeon: Corky Crafts, MD;  Location: [facility] INVASIVE CV LAB;  Service: Cardiovascular;  Laterality: N/A;   PATELLA FRACTURE SURGERY Left 1993    Current Medications: Current Meds  Medication Sig   acetaminophen (TYLENOL) 500 MG  tablet Take 1,000 mg by mouth 2 (two) times daily as needed.   alum & mag hydroxide-simeth (MAALOX PLUS) 400-400-40 MG/5ML suspension Take 10 mLs by mouth as needed for indigestion.   atorvastatin (LIPITOR) 10 MG tablet TAKE 1 TABLET ONCE DAILY.   Calcium-Phosphorus-Vitamin D (CALCIUM/VITAMIN D3/ADULT GUMMY) 250-100-500 MG-MG-UNIT CHEW Chew 1 Piece of gum by mouth 2 (two) times daily.   Cholecalciferol (VITAMIN D) 50 MCG (2000 UT) tablet Take 2,000 Units by mouth daily.   clopidogrel (PLAVIX) 75 MG tablet TAKE 1 TABLET ONCE DAILY.   COENZYME Q-10 PO Take 10 mg by mouth at bedtime.   cyanocobalamin 1000 MCG tablet Take 1,000 mcg by mouth daily.   fluticasone (FLONASE) 50 MCG/ACT nasal spray Place 2 sprays into both nostrils daily as needed for allergies.    furosemide (LASIX) 20 MG tablet Take 20 mg by mouth daily.  isosorbide mononitrate (IMDUR) 60 MG 24 hr tablet Take 2 tablets (120 mg total) by mouth daily.   ketoconazole (NIZORAL) 2 % shampoo Apply 1 Application topically 2 (two) times a week. Tuesday and Friday   levocetirizine (XYZAL) 5 MG tablet TAKE 1 TABLET ONCE DAILY IN THE EVENING.   levothyroxine (SYNTHROID) 75 MCG tablet TAKE 1 TABLET ONCE DAILY ON EMPTY STOMACH.   losartan (COZAAR) 25 MG tablet Take 25 mg by mouth daily.   melatonin 1 MG TABS tablet Take 1 mg by mouth at bedtime.   memantine (NAMENDA) 10 MG tablet Take 10 mg by mouth 2 (two) times daily.   Menthol, Topical Analgesic, (BIOFREEZE) 10 % LIQD Apply topically as needed.   methocarbamol (ROBAXIN) 750 MG tablet Take 750 mg by mouth every 6 (six) hours as needed for muscle spasms.   metoprolol succinate (TOPROL-XL) 25 MG 24 hr tablet Take 25 mg by mouth daily. Take with or immediately following a meal.   miconazole (MICOTIN) 2 % powder Apply topically 2 (two) times daily.  Apply to groin and pannus topically   MYRBETRIQ 50 MG TB24 tablet TAKE 1 TABLET BY MOUTH DAILY.   nitroGLYCERIN (NITROSTAT) 0.4 MG SL tablet Place 0.4  mg under the tongue every 5 (five) minutes as needed for chest pain. X 3 doses   pantoprazole (PROTONIX) 40 MG tablet Take 1 tablet (40 mg total) by mouth 2 (two) times daily.   polyethylene glycol (MIRALAX / GLYCOLAX) 17 g packet Take 17 g by mouth daily.   potassium chloride (MICRO-K) 10 MEQ CR capsule Take 20 mEq by mouth 2 (two) times daily.   predniSONE (DELTASONE) 5 MG tablet Take 5 mg by mouth daily.   PSYLLIUM HUSK PO Take 0.4 g by mouth daily. May keep at bedside   ranolazine (RANEXA) 1000 MG SR tablet TAKE 1 TABLET BY MOUTH TWICE DAILY.   venlafaxine (EFFEXOR) 75 MG tablet Take 150 mg by mouth. 2 caps= 150 mg; oral  Once A Day   zinc oxide 20 % ointment Apply 1 application topically as needed for irritation.     Allergies:   Nsaids, Butorphanol, Sulfa antibiotics, Bisoprolol fumarate, Butorphanol tartrate, Codeine, Demerol [meperidine], Imipramine, Meperidine and related, Statins, Tequin [gatifloxacin], Brilinta [ticagrelor], Pseudoephedrine hcl, Septra [sulfamethoxazole-trimethoprim], and Sulfamethoxazole-trimethoprim   Social History   Socioeconomic History   Marital status: Married    Spouse name: Not on file   Number of children: 2   Years of education: College   Highest education level: Not on file  Occupational History   Occupation: Retired Leisure centre manager  Tobacco Use   Smoking status: Former    Packs/day: 0.50    Years: 7.00    Additional pack years: 0.00    Total pack years: 3.50    Types: Cigarettes    Quit date: 08/21/1969    Years since quitting: 53.0   Smokeless tobacco: Never  Vaping Use   Vaping Use: Never used  Substance and Sexual Activity   Alcohol use: No    Alcohol/week: 0.0 standard drinks of alcohol    Comment: rare   Drug use: No   Sexual activity: Never  Other Topics Concern   Not on file  Social History Narrative   Social History     Social History Narrative       Lives in [facility] with husband.   Diet: Regular   Do you drink/eat  things with caffeine? 1 cup caffeine per day.  Not much coffee  Marital status: Married                           What year were you married? 1964   Do you live in a house, apartment, assisted living, condo, trailer, etc)? Here at [facility]   Is it one or more stories?   How many persons live in your home? 2   Do you have any pets in your home? No   Current or past profession: Metallurgist (RN)   Do you exercise? I did                                                Type & how often: Swimming, Tai Chi, exercise classes   Do you have a living will? yes   Do you have a DNR Form? Yes   Do you have a POA/HPOA forms?             Social Determinants of Health   Financial Resource Strain: Low Risk  (12/07/2017)   Overall Financial Resource Strain (CARDIA)    Difficulty of Paying Living Expenses: Not hard at all  Food Insecurity: No Food Insecurity (12/07/2017)   Hunger Vital Sign    Worried About Running Out of Food in the Last Year: Never true    Ran Out of Food in the Last Year: Never true  Transportation Needs: No Transportation Needs (12/07/2017)   PRAPARE - Administrator, Civil Service (Medical): No    Lack of Transportation (Non-Medical): No  Physical Activity: Inactive (12/07/2017)   Exercise Vital Sign    Days of Exercise per Week: 0 days    Minutes of Exercise per Session: 0 min  Stress: Stress Concern Present (12/07/2017)   [employer] of Occupational Health - Occupational Stress Questionnaire    Feeling of Stress : To some extent  Social Connections: Somewhat Isolated (12/07/2017)   Social Connection and Isolation Panel [NHANES]    Frequency of Communication with Friends and Family: More than three times a week    Frequency of Social Gatherings with Friends and Family: More than three times a week    Attends Religious Services: Never    Database administrator or Organizations: No    Attends Engineer, structural: Never    Marital Status: Married      Family History: The patient's family history includes Diabetes in her brother; Heart attack in her father and mother; Heart disease in her father and mother; Pulmonary embolism in her brother; Stroke in her paternal grandmother. There is no history of Hypertension.  ROS:   Please see the history of present illness.    + chronic shortness of breath All other systems reviewed and are negative.  Labs/Other Studies Reviewed:    The following studies were reviewed today:  Cardiac Studies & Procedures   CARDIAC CATHETERIZATION  CARDIAC CATHETERIZATION 01/12/2018  Narrative  Prox LAD to Mid LAD lesion is 25% stenosed.  Ost Cx to Dist Cx lesion is 90% stenosed. SVG to OM is widely patent.  Mid RCA lesion is 90% stenosed. SVG to PDA is patent. Patent stents in distal graft.  SVG to diagonal is occluded.  Mid LAD lesion is 90% stenosed. LIMA to LAD is patent.  The left ventricular systolic function is normal.  LV end  diastolic pressure is normal. LVEDP 10 mm Hg.  The left ventricular ejection fraction is 55-65% by visual estimate.  There is no aortic valve stenosis.  Continue aggressive secondary prevention. Consider discharge later today.  Findings Coronary Findings Diagnostic  Dominance: Right  Left Anterior Descending Prox LAD to Mid LAD lesion is 25% stenosed. Mid LAD lesion is 90% stenosed. The lesion is segmental.  Ramus Intermedius Vessel is small.  Left Circumflex Ost Cx to Dist Cx lesion is 90% stenosed. The lesion is segmental.  Right Coronary Artery Mid RCA lesion is 90% stenosed.  Single Graft Graft To 2nd Mrg and is normal in caliber. The graft exhibits minimal luminal irregularities. Dist Graft lesion is 25% stenosed.  LIMA Graft To Dist LAD LIMA and is normal in caliber. Y graft with LIMA to LAD and SVG to diagonal.  LIMA portion is patent while vein graft to diagonal is occluded.  Single Graft Graft To 2nd Diag Y graft. LIMA to LAD (patent);  SVG to diagonal (occluded) Prox Graft lesion is 100% stenosed. Dist Graft lesion is 25% stenosed.  Single Graft Graft To RPDA and is large. Non-stenotic Dist Graft lesion was previously treated. Focal 90% with moderate disease on both sides.  Intervention  No interventions have been documented.   CARDIAC CATHETERIZATION  CARDIAC CATHETERIZATION 09/20/2015  Narrative  Severe three vessel CAD.  Patent SVG to OM.  Patent SVG to PDA. Patent stents in this graft.  Y graft with free LIMA to LAD and SVG to diagonal. LIMA portion is patent while vein graft to diagonal is occluded.  Elevated LVEDP 33 mm Hg.  No change in coronary anatomy since October 2016. Patent stents in the SVG to PDA. Elevated LVEDP. Would diurese with Lasix to help reduce intravascular volume.  Findings Coronary Findings Diagnostic  Dominance: Right  Left Anterior Descending  Diffuse.  Ramus Intermedius . Vessel is small.  Left Circumflex Diffuse.  Right Coronary Artery  Single Graft Graft To 2nd Mrg SVG was injected is normal in caliber. The graft exhibits minimal luminal irregularities.  LIMA Graft To Dist LAD LIMA is normal in caliber. Y graft with LIMA to LAD and SVG to diagonal.  LIMA portion is patent while vein graft to diagonal is occluded.  Single Graft Graft To 2nd Diag SVG was injected . Y graft. LIMA to LAD (patent); SVG to diagonal (occluded)  Single Graft Graft To RPDA SVG was injected is large. Previously placed Dist Graft drug eluting stent is patent. Focal 90% with moderate disease on both sides.  Intervention  No interventions have been documented.   STRESS TESTS  NM MYOCAR MULTI W/SPECT W (Needs Review) 10/15/2018 This result has not been signed. Information might be incomplete.  Narrative  Defect 1: There is a medium defect of severe severity present in the basal anteroseptal, basal inferoseptal, mid anteroseptal, mid inferoseptal and apical septal location.   Findings consistent with prior myocardial infarction.  This is an intermediate risk study.  There was no ST segment deviation noted during stress.  T wave inversion was noted at rest in the I and aVL leads. T wave inversion persisted and were unchanged.  There is absence of perfusion in the ventricular septum from apex to base, with no reversibility. Suggests previous infarction. No ischemia noted. EF visually appears to be 50%. Akinesis due to absent tracer uptake in ventricular septum, remainder of wall motion is grossly normal.   ECHOCARDIOGRAM  ECHOCARDIOGRAM COMPLETE 07/18/2018  Narrative ECHOCARDIOGRAM REPORT  IMPRESSIONS  1. The left ventricle has normal systolic function, with an ejection fraction of 55-60%. The cavity size was normal. Left ventricular diastolic Doppler parameters are consistent with impaired relaxation. No evidence of left ventricular regional wall motion abnormalities. 2. The right ventricle has normal systolic function. The cavity was normal. There is no increase in right ventricular wall thickness. 3. Trivial pericardial effusion is present. 4. There is mild mitral annular calcification present. No evidence of mitral valve stenosis. No significant mitral regurgitation. 5. The aortic valve is tricuspid. Mild calcification of the aortic valve. No stenosis of the aortic valve. 6. The aortic root and ascending aorta are normal in size and structure. 7. The IVC is normal. No complete TR doppler jet so unable to estimate PA systolic pressure.  FINDINGS Left Ventricle: The left ventricle has normal systolic function, with an ejection fraction of 55-60%. The cavity size was normal. There is no increase in left ventricular wall thickness. Left ventricular diastolic Doppler parameters are consistent with impaired relaxation. No evidence of left ventricular regional wall motion abnormalities..  Right Ventricle: The right ventricle has normal systolic function. The  cavity was normal. There is no increase in right ventricular wall thickness.  Left Atrium: Left atrial size was normal in size.  Right Atrium: Right atrial size was normal in size.  Interatrial Septum: No atrial level shunt detected by color flow Doppler.  Pericardium: Trivial pericardial effusion is present.  Mitral Valve: The mitral valve is normal in structure. There is mild mitral annular calcification present. Mitral valve regurgitation is not visualized by color flow Doppler. No evidence of mitral valve stenosis.  Tricuspid Valve: The tricuspid valve is normal in structure. Tricuspid valve regurgitation was not visualized by color flow Doppler.  Aortic Valve: The aortic valve is tricuspid Mild calcification of the aortic valve. Aortic valve regurgitation was not visualized by color flow Doppler. There is No stenosis of the aortic valve.  Pulmonic Valve: The pulmonic valve was normal in structure. Pulmonic valve regurgitation is trivial by color flow Doppler.  Aorta: The aortic root and ascending aorta are normal in size and structure.  Venous: The inferior vena cava is normal in size with greater than 50% respiratory variability.                Recent Labs: 12/05/2021: ALT 7; Hemoglobin 10.3; Platelets 268 05/28/2022: BUN 40; Creatinine 1.6; Potassium 4.2; Sodium 140  Recent Lipid Panel    Component Value Date/Time   CHOL 151 08/24/2020 0000   TRIG 162 (A) 08/24/2020 0000   HDL 57 08/24/2020 0000   CHOLHDL 2.1 06/07/2018 0710   VLDL 40 01/12/2018 0549   LDLCALC 70 08/24/2020 0000   LDLCALC 65 06/07/2018 0710     Risk Assessment/Calculations:           Physical Exam:    VS:  BP (!) 122/58   Pulse 77   Ht 4\' 10"  (1.473 m)   Wt 192 lb 3.2 oz (87.2 kg)   SpO2 95%   BMI 40.17 kg/m     Wt Readings from Last 3 Encounters:  08/24/22 192 lb 3.2 oz (87.2 kg)  08/19/22 189 lb (85.7 kg)  08/10/22 189 lb (85.7 kg)     GEN: Obese, chronically ill appearing in no  acute distress HEENT: Normal NECK: No JVD; No carotid bruits CARDIAC: RRR, no murmurs, rubs, gallops RESPIRATORY:  Clear to auscultation without rales, wheezing or rhonchi  ABDOMEN: Soft, non-tender, non-distended MUSCULOSKELETAL:  No edema; No deformity. 2+ pedal  pulses, equal bilaterally SKIN: Warm and dry NEUROLOGIC:  Alert and oriented x 3 PSYCHIATRIC:  Normal affect   EKG:  EKG is ordered today.  The ekg ordered today demonstrates normal sinus rhythm at 77 bpm, nonspecific ST abnormality      Diagnoses:    1. Coronary artery disease involving native coronary artery of native heart without angina pectoris   2. Morbid obesity (HCC)   3. Mixed hyperlipidemia   4. Essential hypertension   5. Chronic heart failure with preserved ejection fraction (HCC)    Assessment and Plan:     Chronic HFpEF: Echo 06/2018 revealed normal LVEF 55-60%, evidence of diastolic dysfunction, no rwma, normal RV size and function, trivial pericardial effusion, mild calcification of AV with no stenosis. She reports shortness of breath is chronic and stable.  No dyspnea.  Reports weight is stable. No evidence of volume overload on exam but body habitus makes it difficult to assess.  Continue losartan, Lasix, Imdur, metoprolol.  CAD without angina: History of CABG 2000 with cath 12/2017 that revealed occluded SVG to diag, patent LIMA-LAD, patent SVG-OM, patent SVG-PDA and patent stents in distal graft. She denies chest pain, dyspnea, or other symptoms concerning for angina.  She is not very active but works out some on recumbent stepper. No indication for further ischemic evaluation at this time. She denies bleeding concerns. Continue clopidogrel, metoprolol, losartan, Imdur, atorvastatin.  Hypertension: BP is well controlled. Reports it is monitored frequently by staff at [facility].   Hyperlipidemia: LDL 70 on 08/24/20. She did not tolerate higher doses of atorvastatin. Continue atorvastatin 10 mg daily.    CKD Stage 3b: Scr 1.6, gfr 31 on 05/28/22. No medication changes today. Management per PCP.   Morbid obesity: Exercising 20-30 minutes a few days a week on Nu-Step at [facility].  Admits she prefers reading or listening to podcast.      Disposition: 1 year with Dr. Eldridge Dace  Medication Adjustments/Labs and Tests Ordered: Current medicines are reviewed at length with the patient today.  Concerns regarding medicines are outlined above.  Orders Placed This Encounter  Procedures   EKG 12-Lead   No orders of the defined types were placed in this encounter.   Patient Instructions  Medication Instructions:   Your physician recommends that you continue on your current medications as directed. Please refer to the Current Medication list given to you today.   *If you need a refill on your cardiac medications before your next appointment, please call your pharmacy*   Lab Work:  None ordered.  If you have labs (blood work) drawn today and your tests are completely normal, you will receive your results only by: MyChart Message (if you have MyChart) OR A paper copy in the mail If you have any lab test that is abnormal or we need to change your treatment, we will call you to review the results.   Testing/Procedures:  None ordered.   Follow-Up: At [facility], you and your health needs are our priority.  As part of our continuing mission to provide you with exceptional heart care, we have created designated Provider Care Teams.  These Care Teams include your primary Cardiologist (physician) and Advanced Practice Providers (APPs -  Physician Assistants and Nurse Practitioners) who all work together to provide you with the care you need, when you need it.  We recommend signing up for the patient portal called "MyChart".  Sign up information is provided on this After Visit Summary.  MyChart is  used to connect with patients for Virtual Visits (Telemedicine).  Patients are able to  view lab/test results, encounter notes, upcoming appointments, etc.  Non-urgent messages can be sent to your provider as well.   To learn more about what you can do with MyChart, go to [email or web address].    Your next appointment:   1 year(s)  Provider:   Lance Muss, MD     Other Instructions  Your physician wants you to follow-up in: 1 year with Dr. Eldridge Dace.  You will receive a reminder letter in the mail two months in advance. If you don't receive a letter, please call our office to schedule the follow-up appointment.     Signed, Levi Aland, NP  08/24/2022 5:28 PM    [facility]

## 2022-08-24 ENCOUNTER — Ambulatory Visit: Payer: Medicare Other | Attending: Interventional Cardiology | Admitting: Nurse Practitioner

## 2022-08-24 ENCOUNTER — Encounter: Payer: Self-pay | Admitting: Nurse Practitioner

## 2022-08-24 VITALS — BP 122/58 | HR 77 | Ht <= 58 in | Wt 192.2 lb

## 2022-08-24 DIAGNOSIS — I2510 Atherosclerotic heart disease of native coronary artery without angina pectoris: Secondary | ICD-10-CM | POA: Diagnosis not present

## 2022-08-24 DIAGNOSIS — E782 Mixed hyperlipidemia: Secondary | ICD-10-CM | POA: Diagnosis not present

## 2022-08-24 DIAGNOSIS — I10 Essential (primary) hypertension: Secondary | ICD-10-CM

## 2022-08-24 DIAGNOSIS — I5032 Chronic diastolic (congestive) heart failure: Secondary | ICD-10-CM

## 2022-08-24 DIAGNOSIS — N1832 Chronic kidney disease, stage 3b: Secondary | ICD-10-CM

## 2022-08-24 NOTE — Patient Instructions (Signed)
Medication Instructions:   Your physician recommends that you continue on your current medications as directed. Please refer to the Current Medication list given to you today.   *If you need a refill on your cardiac medications before your next appointment, please call your pharmacy*   Lab Work:  None ordered.  If you have labs (blood work) drawn today and your tests are completely normal, you will receive your results only by: MyChart Message (if you have MyChart) OR A paper copy in the mail If you have any lab test that is abnormal or we need to change your treatment, we will call you to review the results.   Testing/Procedures:  None ordered.   Follow-Up: At [facility], you and your health needs are our priority.  As part of our continuing mission to provide you with exceptional heart care, we have created designated Provider Care Teams.  These Care Teams include your primary Cardiologist (physician) and Advanced Practice Providers (APPs -  Physician Assistants and Nurse Practitioners) who all work together to provide you with the care you need, when you need it.  We recommend signing up for the patient portal called "MyChart".  Sign up information is provided on this After Visit Summary.  MyChart is used to connect with patients for Virtual Visits (Telemedicine).  Patients are able to view lab/test results, encounter notes, upcoming appointments, etc.  Non-urgent messages can be sent to your provider as well.   To learn more about what you can do with MyChart, go to [email or web address].    Your next appointment:   1 year(s)  Provider:   Jayadeep Varanasi, MD     Other Instructions  Your physician wants you to follow-up in: 1 year with Dr. Varanasi.  You will receive a reminder letter in the mail two months in advance. If you don't receive a letter, please call our office to schedule the follow-up appointment.   

## 2022-08-26 ENCOUNTER — Encounter: Payer: Self-pay | Admitting: Adult Health

## 2022-08-26 ENCOUNTER — Non-Acute Institutional Stay: Payer: Medicare Other | Admitting: Adult Health

## 2022-08-26 DIAGNOSIS — M353 Polymyalgia rheumatica: Secondary | ICD-10-CM

## 2022-08-26 DIAGNOSIS — I10 Essential (primary) hypertension: Secondary | ICD-10-CM | POA: Diagnosis not present

## 2022-08-26 DIAGNOSIS — K219 Gastro-esophageal reflux disease without esophagitis: Secondary | ICD-10-CM | POA: Diagnosis not present

## 2022-08-26 NOTE — Progress Notes (Signed)
Location:  [facility] Room Number: 911 Place of Service:  [phone]) Provider:  Kenard Gower, DNP, FNP-BC  Patient Care Team: Mast, Man X, NP as PCP - General (Internal Medicine) Corky Crafts, MD as PCP - Cardiology (Cardiology) Charlott Rakes, MD as Consulting Physician (Gastroenterology) August Saucer Corrie Mckusick, MD as Consulting Physician (Orthopedic Surgery) Levert Feinstein, MD as Consulting Physician (Neurology) Marcine Matar, MD as Consulting Physician (Urology) Donalee Citrin, MD as Consulting Physician (Neurosurgery)  Extended Emergency Contact Information Primary Emergency Contact: Darlin Priestly Address: [address]          [place] [address] [place] Home Phone: [phone] Mobile Phone: [phone] Relation: Spouse Secondary Emergency Contact: Jemison,NATHAN Mobile Phone: [phone] Relation: Son  Code Status:  DNR  Goals of care: Advanced Directive information    08/26/2022   11:23 AM  Advanced Directives  Does Patient Have a Medical Advance Directive? Yes  Type of Advance Directive Living will;Healthcare Power of [place];Out of facility DNR (pink MOST or yellow form)  Does patient want to make changes to medical advance directive? No - Patient declined  Copy of Healthcare Power of Attorney in Chart? Yes - validated most recent copy scanned in chart (See row information)  Pre-existing out of facility DNR order (yellow form or pink MOST form) Pink MOST form placed in chart (order not valid for inpatient use);Yellow form placed in chart (order not valid for inpatient use)     Chief Complaint  Patient presents with   Acute Visit    Right thigh pain    HPI:  Pt is a 82 y.o. female seen today for an acute visit regarding right thigh pain. She is a resident of [facility]. She complained of pain on her right thigh/groin which causes her lack of sleep. She was put on Prednisone  taper before which relieved the pain but pain is now back. She is currently taking Prednisone 5 mg daily for PMR.   BP 134/57, takes Losartan for hypertension.  She denies having acid reflux episodes, takes Protonix and Reglan for GERD.   Past Medical History:  Diagnosis Date   Anemia    Arthritis    "fingers, back, shoulders, hips" (04/10/2016)   Chronic diastolic CHF (congestive heart failure) (HCC)    a. normal EF, LVEDP [facility] in 6/17 33 >> Lasix started    CKD (chronic kidney disease), stage III (HCC)    Coronary artery disease    a. s/p CABG 2000 (SVG/ free LIMA Y graft to the diagonal and distal LAD, SVG to the OM 1, and SVG to the PDA) . b. Cath 12/19/2014 90% dSVG to RCA s/p 2 overlapping DES. c. LHC 2016, 2017 no intervention except diuresis needed. d. Low risk nuc 03/2016. e. Cath 12/2017 patent LIMA-LAD, SVG-OM, SVG-PDA but occluded SVG to diag.    Depression    with anxious component   GERD (gastroesophageal reflux disease)    History of hiatal hernia    Hyperlipidemia    Hypothyroidism    Obesity    OSA on CPAP    PMR (polymyalgia rheumatica) (HCC)    Pneumonia    "2-3 times" (04/10/2016)   Stable angina    microvascular, improved with Ranexa   Subclavian artery stenosis (HCC)    a. L by cath note in 2016.   TIA (transient ischemic attack)    Past Surgical History:  Procedure Laterality Date   BREAST BIOPSY Right 1964  CARDIAC CATHETERIZATION  08   patent grafts, no culprit lesions, EF 65%   CARDIAC CATHETERIZATION N/A 12/19/2014   Procedure: Left Heart Cath and Cors/Grafts Angiography;  Surgeon: Corky Crafts, MD;  Location: [facility] INVASIVE CV LAB;  Service: Cardiovascular;  Laterality: N/A;   CARDIAC CATHETERIZATION N/A 12/19/2014   Procedure: Coronary Stent Intervention;  Surgeon: Corky Crafts, MD;  Location: [facility] INVASIVE CV LAB;  Service: Cardiovascular;  Laterality: N/A;   CARDIAC CATHETERIZATION N/A 01/01/2015   Procedure: Left Heart Cath and  Cors/Grafts Angiography;  Surgeon: Corky Crafts, MD;  Location: [facility] INVASIVE CV LAB;  Service: Cardiovascular;  Laterality: N/A;   CARDIAC CATHETERIZATION N/A 09/20/2015   Procedure: Left Heart Cath and Cors/Grafts Angiography;  Surgeon: Corky Crafts, MD;  Location: [facility] INVASIVE CV LAB;  Service: Cardiovascular;  Laterality: N/A;   CATARACT EXTRACTION W/ INTRAOCULAR LENS  IMPLANT, BILATERAL Bilateral 2011   COLONOSCOPY WITH PROPOFOL N/A 07/27/2016   Procedure: COLONOSCOPY WITH PROPOFOL;  Surgeon: Charlott Rakes, MD;  Location: [facility] ENDOSCOPY;  Service: Endoscopy;  Laterality: N/A;   CORONARY ARTERY BYPASS GRAFT  2000   ASCVD, multivessel, S./P.   DILATION AND CURETTAGE OF UTERUS  1980s   ESOPHAGOGASTRODUODENOSCOPY (EGD) WITH PROPOFOL N/A 07/27/2016   Procedure: ESOPHAGOGASTRODUODENOSCOPY (EGD) WITH PROPOFOL;  Surgeon: Charlott Rakes, MD;  Location: [facility] ENDOSCOPY;  Service: Endoscopy;  Laterality: N/A;   FRACTURE SURGERY     LEFT HEART CATH AND CORS/GRAFTS ANGIOGRAPHY N/A 01/12/2018   Procedure: LEFT HEART CATH AND CORS/GRAFTS ANGIOGRAPHY;  Surgeon: Corky Crafts, MD;  Location: [facility] INVASIVE CV LAB;  Service: Cardiovascular;  Laterality: N/A;   PATELLA FRACTURE SURGERY Left 1993    Allergies  Allergen Reactions   Nsaids Other (See Comments)    Due to chronic kidney failure   Butorphanol Other (See Comments)    agitation Constipation Produced a lot of urine, made patient feel crazy   Sulfa Antibiotics Rash   Bisoprolol Fumarate Other (See Comments)    Doesn't remember   Butorphanol Tartrate Other (See Comments)    Produced a lot of urine, made patient feel crazy   Codeine Nausea And Vomiting   Demerol [Meperidine] Nausea And Vomiting   Imipramine     Sweating, facial dysfunction    Meperidine And Related Nausea And Vomiting   Statins     MYALGIAS   Tequin [Gatifloxacin] Other (See Comments)    Caused hypoglycemia Low blood sugar   Brilinta [Ticagrelor] Rash     CAUSES PETECHIAE, PURPURA   Pseudoephedrine Hcl Palpitations   Septra [Sulfamethoxazole-Trimethoprim] Rash   Sulfamethoxazole-Trimethoprim Rash    Outpatient Encounter Medications as of 08/26/2022  Medication Sig   acetaminophen (TYLENOL) 500 MG tablet Take 1,000 mg by mouth 2 (two) times daily as needed.   alum & mag hydroxide-simeth (MAALOX PLUS) 400-400-40 MG/5ML suspension Take 10 mLs by mouth as needed for indigestion.   atorvastatin (LIPITOR) 10 MG tablet TAKE 1 TABLET ONCE DAILY.   Calcium-Phosphorus-Vitamin D (CALCIUM/VITAMIN D3/ADULT GUMMY) 250-100-500 MG-MG-UNIT CHEW Chew 1 Piece of gum by mouth 2 (two) times daily.   Cholecalciferol (VITAMIN D) 50 MCG (2000 UT) tablet Take 2,000 Units by mouth daily.   clopidogrel (PLAVIX) 75 MG tablet TAKE 1 TABLET ONCE DAILY.   COENZYME Q-10 PO Take 10 mg by mouth at bedtime.   cyanocobalamin 1000 MCG tablet Take 1,000 mcg by mouth daily.   fluticasone (FLONASE) 50 MCG/ACT nasal spray Place 2 sprays into both nostrils daily as needed for allergies.  furosemide (LASIX) 20 MG tablet Take 20 mg by mouth daily.   isosorbide mononitrate (IMDUR) 60 MG 24 hr tablet Take 2 tablets (120 mg total) by mouth daily.   ketoconazole (NIZORAL) 2 % shampoo Apply 1 Application topically 2 (two) times a week. Tuesday and Friday   levocetirizine (XYZAL) 5 MG tablet TAKE 1 TABLET ONCE DAILY IN THE EVENING.   levothyroxine (SYNTHROID) 75 MCG tablet TAKE 1 TABLET ONCE DAILY ON EMPTY STOMACH.   losartan (COZAAR) 25 MG tablet Take 25 mg by mouth daily.   melatonin 1 MG TABS tablet Take 1 mg by mouth at bedtime.   memantine (NAMENDA) 10 MG tablet Take 10 mg by mouth 2 (two) times daily.   Menthol, Topical Analgesic, (BIOFREEZE) 10 % LIQD Apply topically as needed.   methocarbamol (ROBAXIN) 750 MG tablet Take 750 mg by mouth every 6 (six) hours as needed for muscle spasms.   metoCLOPramide (REGLAN) 5 MG tablet Take 5 mg by mouth in the morning and at bedtime.    metoprolol succinate (TOPROL-XL) 25 MG 24 hr tablet Take 25 mg by mouth daily. Take with or immediately following a meal.   miconazole (MICOTIN) 2 % powder Apply topically 2 (two) times daily.  Apply to groin and pannus topically   MYRBETRIQ 50 MG TB24 tablet TAKE 1 TABLET BY MOUTH DAILY.   nitroGLYCERIN (NITROSTAT) 0.4 MG SL tablet Place 0.4 mg under the tongue every 5 (five) minutes as needed for chest pain. X 3 doses   pantoprazole (PROTONIX) 40 MG tablet Take 1 tablet (40 mg total) by mouth 2 (two) times daily.   polyethylene glycol (MIRALAX / GLYCOLAX) 17 g packet Take 17 g by mouth daily.   potassium chloride (MICRO-K) 10 MEQ CR capsule Take 20 mEq by mouth 2 (two) times daily.   predniSONE (DELTASONE) 5 MG tablet Take 5 mg by mouth daily.   PSYLLIUM HUSK PO Take 0.4 g by mouth daily. May keep at bedside   ranolazine (RANEXA) 1000 MG SR tablet TAKE 1 TABLET BY MOUTH TWICE DAILY.   venlafaxine (EFFEXOR) 75 MG tablet Take 150 mg by mouth. 2 caps= 150 mg; oral  Once A Day   zinc oxide 20 % ointment Apply 1 application topically as needed for irritation.   No facility-administered encounter medications on file as of 08/26/2022.    Review of Systems  Constitutional:  Negative for appetite change, chills, fatigue and fever.  HENT:  Negative for congestion, hearing loss, rhinorrhea and sore throat.   Eyes: Negative.   Respiratory:  Negative for cough, shortness of breath and wheezing.   Cardiovascular:  Negative for chest pain, palpitations and leg swelling.  Gastrointestinal:  Negative for abdominal pain, constipation, diarrhea, nausea and vomiting.  Genitourinary:  Negative for dysuria.  Musculoskeletal:  Positive for arthralgias. Negative for back pain and myalgias.  Skin:  Negative for color change, rash and wound.  Neurological:  Negative for dizziness, weakness and headaches.  Psychiatric/Behavioral:  Negative for behavioral problems. The patient is not nervous/anxious.       Immunization History  Administered Date(s) Administered   Fluad Quad(high Dose 65+) 01/12/2022   HPV Bivalent 11/24/2013, 01/17/2016   Influenza Whole 12/23/2017   Influenza, High Dose Seasonal PF 12/24/2018, 12/24/2018   Influenza-Unspecified 01/10/2013, 12/27/2014, 12/30/2016, 01/02/2020, 01/09/2021   Moderna SARS-COV2 Booster Vaccination 08/08/2021   Moderna Sars-Covid-2 Vaccination 03/25/2019, 04/22/2019, 01/30/2020, 08/20/2020, 12/10/2020   Pfizer Covid-19 Vaccine Bivalent Booster 79yrs & up 01/22/2022   Pneumococcal Conjugate-13 04/21/2013  Pneumococcal Polysaccharide-23 02/27/1993, 03/23/2009   RSV,unspecified 05/25/2022   Tdap 04/20/2012   Zoster Recombinat (Shingrix) 03/29/2021, 06/09/2021   Zoster, Live 11/10/2010   Pertinent  Health Maintenance Due  Topic Date Due   INFLUENZA VACCINE  10/22/2022   DEXA SCAN  Completed      03/27/2021    9:56 PM 03/31/2022   10:18 AM 04/03/2022    2:08 PM 07/13/2022    9:50 AM 08/10/2022   11:01 AM  Fall Risk  Falls in the past year?  0 0 0 0  Was there an injury with Fall?  0 0 0 0  Fall Risk Category Calculator  0 0 0 0  Fall Risk Category (Retired)  Low Low    (RETIRED) Patient Fall Risk Level Moderate fall risk Low fall risk Low fall risk    Patient at Risk for Falls Due to  No Fall Risks No Fall Risks  No Fall Risks  Fall risk Follow up  Falls evaluation completed Falls evaluation completed Falls evaluation completed Falls evaluation completed     Vitals:   08/26/22 1132  BP: (!) 134/57  Pulse: 67  Resp: 20  Temp: (!) 96.7 F (35.9 C)  SpO2: 96%  Weight: 190 lb 3.2 oz (86.3 kg)  Height: 4\' 10"  (1.473 m)   Body mass index is 39.75 kg/m.  Physical Exam Constitutional:      Appearance: She is obese.  HENT:     Head: Normocephalic and atraumatic.     Nose: Nose normal.     Mouth/Throat:     Mouth: Mucous membranes are moist.  Eyes:     Conjunctiva/sclera: Conjunctivae normal.  Cardiovascular:     Rate and  Rhythm: Normal rate and regular rhythm.  Pulmonary:     Effort: Pulmonary effort is normal.     Breath sounds: Normal breath sounds.  Abdominal:     General: Bowel sounds are normal.     Palpations: Abdomen is soft.  Musculoskeletal:     Cervical back: Normal range of motion.  Skin:    General: Skin is warm and dry.  Neurological:     General: No focal deficit present.     Mental Status: She is alert and oriented to person, place, and time.  Psychiatric:        Mood and Affect: Mood normal.        Behavior: Behavior normal.        Thought Content: Thought content normal.        Judgment: Judgment normal.      Labs reviewed: Recent Labs    12/05/21 0000 05/28/22 0000  NA 140 140  K 4.4 4.2  CL 109* 107  CO2 24* 23*  BUN 29* 40*  CREATININE 1.4* 1.6*  CALCIUM 8.7 8.3*   Recent Labs    12/05/21 0000  AST 11*  ALT 7  ALKPHOS 49  ALBUMIN 3.9   Recent Labs    12/05/21 0000  WBC 12.2  NEUTROABS 8,747.00  HGB 10.3*  HCT 32*  PLT 268   Lab Results  Component Value Date   TSH 3.56 04/01/2021   Lab Results  Component Value Date   HGBA1C 5.1 02/14/2019   Lab Results  Component Value Date   CHOL 151 08/24/2020   HDL 57 08/24/2020   LDLCALC 70 08/24/2020   TRIG 162 (A) 08/24/2020   CHOLHDL 2.1 06/07/2018    Significant Diagnostic Results in last 30 days:  No results found.  Assessment/Plan  1. Polymyalgia rheumatica (HCC) -  will increase Prednisone to 20 mg daily X 7 days then 10 mg daily X 7 days then 5 mg daily  2. Essential hypertension -  BPs stable -  continue Metoprolol and Losartan  3. Gastroesophageal reflux disease without esophagitis -  stable -  continue Reglan and Protonix    Family/ staff Communication: Discussed plan of care with resident and charge nurse.  Labs/tests ordered: None    Kenard Gower, DNP, MSN, FNP-BC [facility] [phone] (Monday-Friday 8:00 a.m. - 5:00  p.m.) [phone] (after hours)

## 2022-09-03 ENCOUNTER — Non-Acute Institutional Stay: Payer: Medicare Other | Admitting: Nurse Practitioner

## 2022-09-03 ENCOUNTER — Encounter: Payer: Self-pay | Admitting: Nurse Practitioner

## 2022-09-03 DIAGNOSIS — R350 Frequency of micturition: Secondary | ICD-10-CM

## 2022-09-03 DIAGNOSIS — G4733 Obstructive sleep apnea (adult) (pediatric): Secondary | ICD-10-CM

## 2022-09-03 DIAGNOSIS — R413 Other amnesia: Secondary | ICD-10-CM

## 2022-09-03 DIAGNOSIS — F339 Major depressive disorder, recurrent, unspecified: Secondary | ICD-10-CM

## 2022-09-03 DIAGNOSIS — M8000XA Age-related osteoporosis with current pathological fracture, unspecified site, initial encounter for fracture: Secondary | ICD-10-CM

## 2022-09-03 DIAGNOSIS — E785 Hyperlipidemia, unspecified: Secondary | ICD-10-CM

## 2022-09-03 DIAGNOSIS — R2681 Unsteadiness on feet: Secondary | ICD-10-CM

## 2022-09-03 DIAGNOSIS — I10 Essential (primary) hypertension: Secondary | ICD-10-CM | POA: Diagnosis not present

## 2022-09-03 DIAGNOSIS — K219 Gastro-esophageal reflux disease without esophagitis: Secondary | ICD-10-CM | POA: Diagnosis not present

## 2022-09-03 DIAGNOSIS — N1831 Chronic kidney disease, stage 3a: Secondary | ICD-10-CM

## 2022-09-03 DIAGNOSIS — M159 Polyosteoarthritis, unspecified: Secondary | ICD-10-CM

## 2022-09-03 DIAGNOSIS — M353 Polymyalgia rheumatica: Secondary | ICD-10-CM

## 2022-09-03 DIAGNOSIS — D631 Anemia in chronic kidney disease: Secondary | ICD-10-CM

## 2022-09-03 DIAGNOSIS — D519 Vitamin B12 deficiency anemia, unspecified: Secondary | ICD-10-CM

## 2022-09-03 DIAGNOSIS — I2510 Atherosclerotic heart disease of native coronary artery without angina pectoris: Secondary | ICD-10-CM

## 2022-09-03 DIAGNOSIS — N1832 Chronic kidney disease, stage 3b: Secondary | ICD-10-CM

## 2022-09-03 DIAGNOSIS — E039 Hypothyroidism, unspecified: Secondary | ICD-10-CM

## 2022-09-03 NOTE — Assessment & Plan Note (Addendum)
Blood pressure is controlled,  on Metoprolol, Losartan  

## 2022-09-03 NOTE — Assessment & Plan Note (Signed)
uses walker for ambulation, w/c to go further.  

## 2022-09-03 NOTE — Progress Notes (Signed)
Location:  [facility] Room Number: AL/911/A Place of Service:  ALF (13) Provider:  Fallyn Munnerlyn X, NP  Patient Care Team: Tifini Reeder X, NP as PCP - General (Internal Medicine) Corky Crafts, MD as PCP - Cardiology (Cardiology) Charlott Rakes, MD as Consulting Physician (Gastroenterology) August Saucer, Corrie Mckusick, MD as Consulting Physician (Orthopedic Surgery) Levert Feinstein, MD as Consulting Physician (Neurology) Marcine Matar, MD as Consulting Physician (Urology) Donalee Citrin, MD as Consulting Physician (Neurosurgery)  Extended Emergency Contact Information Primary Emergency Contact: Darlin Priestly Address: [address]          [place] [address] [place] Home Phone: [phone] Mobile Phone: [phone] Relation: Spouse Secondary Emergency Contact: Salido,NATHAN Mobile Phone: [phone] Relation: Son  Code Status:  DNR Goals of care: Advanced Directive information    08/26/2022   11:23 AM  Advanced Directives  Does Patient Have a Medical Advance Directive? Yes  Type of Advance Directive Living will;Healthcare Power of [place];Out of facility DNR (pink MOST or yellow form)  Does patient want to make changes to medical advance directive? No - Patient declined  Copy of Healthcare Power of Attorney in Chart? Yes - validated most recent copy scanned in chart (See row information)  Pre-existing out of facility DNR order (yellow form or pink MOST form) Pink MOST form placed in chart (order not valid for inpatient use);Yellow form placed in chart (order not valid for inpatient use)     Chief Complaint  Patient presents with   Acute Visit    Patient is being seen for right leg pain ( trigger nerve in outer thigh)    HPI:  Pt is a 82 y.o. female seen today for an acute visit for c/o lateral mid thigh about the patient's palm sized muscle pain palpated, no redness, warmth, or trauma. 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   GERD, better on  Pantoprazole, Reglan, and prn Maalox, 06/05/22 esophogram barium swallow study, dysmotility, normal lipase, Amylase, negative H-pylori 08/20/22             PMR, takes Prednisone 5mg  qd(failed GDR), followed by Dr. Kathi Ludwig HTN, on Metoprolol, Losartan, Bun/creat 33/1.31 08/20/22             Memory loss: MMSE  29/38 05/27/22, 07/02/21 CT head w/o CM, No acute abnormality no change from earlier this year. Moderate atrophy and moderate chronic microvascular ischemic change in the white matter. On Memantine.               Gait abnormality, uses walker for ambulation, w/c to go further.  CKD Bun/creat 33/1.31 08/20/22             CAD, on Isosorbide and Ranolazine, Plavix. LDL 70 08/24/20             Hypothyroidism, on Levothyroxine, TSH 3.03 12/04/21             Anemia, on B12, Hgb 9.3, Vit B12 1133, Fe 72 08/25/22, FOBT positive x3, pending GI consultation.              Urinary frequency and occasional incontinent of urine, on Myrbetriq.             Depression, stable, on Venlafaxine             OA, takes Tylenol, Methocarbamol prn, 06/18/22 ortho X-ay R+L hip  OP, takes Ca, Vit D. 04/29/21 Dr. Kathi Ludwig dc Fosamax.  05/07/21 DEXA t score -1.273             Vit B12 deficiency, takes Vit B12, Vit B12 1133 08/25/22             Hyperlipidemia, taking Atorvastatin.              OSA, uses CPAP     Past Medical History:  Diagnosis Date   Anemia    Arthritis    "fingers, back, shoulders, hips" (04/10/2016)   Chronic diastolic CHF (congestive heart failure) (HCC)    a. normal EF, LVEDP at [facility] in 6/17 33 >> Lasix started    CKD (chronic kidney disease), stage III (HCC)    Coronary artery disease    a. s/p CABG 2000 (SVG/ free LIMA Y graft to the diagonal and distal LAD, SVG to the OM 1, and SVG to the PDA) . b. Cath 12/19/2014 90% dSVG to RCA s/p 2 overlapping DES. c. LHC 2016,  2017 no intervention except diuresis needed. d. Low risk nuc 03/2016. e. Cath 12/2017 patent LIMA-LAD, SVG-OM, SVG-PDA but occluded SVG to diag.    Depression    with anxious component   GERD (gastroesophageal reflux disease)    History of hiatal hernia    Hyperlipidemia    Hypothyroidism    Obesity    OSA on CPAP    PMR (polymyalgia rheumatica) (HCC)    Pneumonia    "2-3 times" (04/10/2016)   Stable angina    microvascular, improved with Ranexa   Subclavian artery stenosis (HCC)    a. L by cath note in 2016.   TIA (transient ischemic attack)    Past Surgical History:  Procedure Laterality Date   BREAST BIOPSY Right 1964   CARDIAC CATHETERIZATION  08   patent grafts, no culprit lesions, EF 65%   CARDIAC CATHETERIZATION N/A 12/19/2014   Procedure: Left Heart Cath and Cors/Grafts Angiography;  Surgeon: Corky Crafts, MD;  Location: [facility] INVASIVE CV LAB;  Service: Cardiovascular;  Laterality: N/A;   CARDIAC CATHETERIZATION N/A 12/19/2014   Procedure: Coronary Stent Intervention;  Surgeon: Corky Crafts, MD;  Location: [facility] INVASIVE CV LAB;  Service: Cardiovascular;  Laterality: N/A;   CARDIAC CATHETERIZATION N/A 01/01/2015   Procedure: Left Heart Cath and Cors/Grafts Angiography;  Surgeon: Corky Crafts, MD;  Location: [facility] INVASIVE CV LAB;  Service: Cardiovascular;  Laterality: N/A;   CARDIAC CATHETERIZATION N/A 09/20/2015   Procedure: Left Heart Cath and Cors/Grafts Angiography;  Surgeon: Corky Crafts, MD;  Location: [facility] INVASIVE CV LAB;  Service: Cardiovascular;  Laterality: N/A;   CATARACT EXTRACTION W/ INTRAOCULAR LENS  IMPLANT, BILATERAL Bilateral 2011   COLONOSCOPY WITH PROPOFOL N/A 07/27/2016   Procedure: COLONOSCOPY WITH PROPOFOL;  Surgeon: Charlott Rakes, MD;  Location: [facility] ENDOSCOPY;  Service: Endoscopy;  Laterality: N/A;   CORONARY ARTERY BYPASS GRAFT  2000   ASCVD, multivessel, S./P.   DILATION AND CURETTAGE OF UTERUS  1980s   ESOPHAGOGASTRODUODENOSCOPY  (EGD) WITH PROPOFOL N/A 07/27/2016   Procedure: ESOPHAGOGASTRODUODENOSCOPY (EGD) WITH PROPOFOL;  Surgeon: Charlott Rakes, MD;  Location: [facility] ENDOSCOPY;  Service: Endoscopy;  Laterality: N/A;   FRACTURE SURGERY     LEFT HEART CATH AND CORS/GRAFTS ANGIOGRAPHY N/A 01/12/2018   Procedure: LEFT HEART CATH AND CORS/GRAFTS ANGIOGRAPHY;  Surgeon: Corky Crafts, MD;  Location: [facility] INVASIVE CV LAB;  Service: Cardiovascular;  Laterality: N/A;   PATELLA FRACTURE SURGERY Left 1993    Allergies  Allergen Reactions   Nsaids Other (See Comments)    Due to chronic kidney failure   Butorphanol Other (See Comments)    agitation Constipation Produced a lot of urine, made patient feel crazy   Sulfa Antibiotics Rash   Bisoprolol Fumarate Other (See Comments)    Doesn't remember   Butorphanol Tartrate Other (See Comments)    Produced a lot of urine, made patient feel crazy   Codeine Nausea And Vomiting   Demerol [Meperidine] Nausea And Vomiting   Imipramine     Sweating, facial dysfunction    Meperidine And Related Nausea And Vomiting   Statins     MYALGIAS   Tequin [Gatifloxacin] Other (See Comments)    Caused hypoglycemia Low blood sugar   Brilinta [Ticagrelor] Rash    CAUSES PETECHIAE, PURPURA   Pseudoephedrine Hcl Palpitations   Septra [Sulfamethoxazole-Trimethoprim] Rash   Sulfamethoxazole-Trimethoprim Rash    Outpatient Encounter Medications as of 09/03/2022  Medication Sig   acetaminophen (TYLENOL) 500 MG tablet Take 1,000 mg by mouth 2 (two) times daily as needed.   alum & mag hydroxide-simeth (MAALOX PLUS) 400-400-40 MG/5ML suspension Take 10 mLs by mouth as needed for indigestion.   atorvastatin (LIPITOR) 10 MG tablet TAKE 1 TABLET ONCE DAILY.   Calcium-Phosphorus-Vitamin D (CALCIUM/VITAMIN D3/ADULT GUMMY) 250-100-500 MG-MG-UNIT CHEW Chew 1 Piece of gum by mouth 2 (two) times daily.   Cholecalciferol (VITAMIN D) 50 MCG (2000 UT) tablet Take 2,000 Units by mouth daily.    clopidogrel (PLAVIX) 75 MG tablet TAKE 1 TABLET ONCE DAILY.   COENZYME Q-10 PO Take 10 mg by mouth at bedtime.   cyanocobalamin 1000 MCG tablet Take 1,000 mcg by mouth daily.   fluticasone (FLONASE) 50 MCG/ACT nasal spray Place 2 sprays into both nostrils daily as needed for allergies.    furosemide (LASIX) 20 MG tablet Take 20 mg by mouth daily.   isosorbide mononitrate (IMDUR) 60 MG 24 hr tablet Take 2 tablets (120 mg total) by mouth daily.   ketoconazole (NIZORAL) 2 % shampoo Apply 1 Application topically 2 (two) times a week. Tuesday and Friday   levocetirizine (XYZAL) 5 MG tablet TAKE 1 TABLET ONCE DAILY IN THE EVENING.   levothyroxine (SYNTHROID) 75 MCG tablet TAKE 1 TABLET ONCE DAILY ON EMPTY STOMACH.   losartan (COZAAR) 25 MG tablet Take 25 mg by mouth daily.   melatonin 1 MG TABS tablet Take 1 mg by mouth at bedtime.   memantine (NAMENDA) 10 MG tablet Take 10 mg by mouth 2 (two) times daily.   Menthol, Topical Analgesic, (BIOFREEZE) 10 % LIQD Apply topically as needed.   methocarbamol (ROBAXIN) 750 MG tablet Take 750 mg by mouth every 6 (six) hours as needed for muscle spasms.   metoCLOPramide (REGLAN) 5 MG tablet Take 5 mg by mouth in the morning and at bedtime.   metoprolol succinate (TOPROL-XL) 25 MG 24 hr tablet Take 25 mg by mouth daily. Take with or immediately following a meal.   miconazole (MICOTIN) 2 % powder Apply topically 2 (two) times daily.  Apply to groin and pannus topically   MYRBETRIQ 50 MG TB24 tablet TAKE 1 TABLET BY MOUTH DAILY.   nitroGLYCERIN (NITROSTAT) 0.4 MG SL tablet Place 0.4 mg under the tongue every 5 (five) minutes as needed for chest pain. X 3 doses   pantoprazole (PROTONIX) 40 MG tablet Take 1 tablet (40 mg total) by mouth 2 (two) times daily.   polyethylene glycol (MIRALAX / GLYCOLAX) 17 g packet Take 17 g by mouth  daily.   potassium chloride (MICRO-K) 10 MEQ CR capsule Take 20 mEq by mouth 2 (two) times daily.   predniSONE (DELTASONE) 5 MG tablet Take  5 mg by mouth daily.   PSYLLIUM HUSK PO Take 0.4 g by mouth daily. May keep at bedside   ranolazine (RANEXA) 1000 MG SR tablet TAKE 1 TABLET BY MOUTH TWICE DAILY.   venlafaxine (EFFEXOR) 75 MG tablet Take 150 mg by mouth. 2 caps= 150 mg; oral  Once A Day   zinc oxide 20 % ointment Apply 1 application topically as needed for irritation.   No facility-administered encounter medications on file as of 09/03/2022.    Review of Systems  Constitutional:  Negative for activity change, appetite change and fever.  HENT:  Positive for hearing loss. Negative for congestion and voice change.   Eyes:  Negative for visual disturbance.  Respiratory:  Positive for shortness of breath. Negative for cough and wheezing.   Cardiovascular:  Positive for leg swelling.  Gastrointestinal:  Negative for abdominal pain and constipation.       Heart burn, indigestion-improved since Reglan started.   Genitourinary:  Negative for dysuria, frequency and urgency.       1-2x/night bathroom trips.   Musculoskeletal:  Positive for arthralgias, gait problem and myalgias.       Right lateral thigh about the patient's hand sized area pain palpated, no skin breakdown, deformity, skin redness or warmth, s/s of trauma or infection  Skin:  Negative for color change.  Neurological:  Negative for speech difficulty, weakness and headaches.       Memory lapses.   Psychiatric/Behavioral:  Negative for behavioral problems, hallucinations and sleep disturbance. The patient is not nervous/anxious.     Immunization History  Administered Date(s) Administered   Fluad Quad(high Dose 65+) 01/12/2022   HPV Bivalent 11/24/2013, 01/17/2016   Influenza Whole 12/23/2017   Influenza, High Dose Seasonal PF 12/24/2018, 12/24/2018   Influenza-Unspecified 01/10/2013, 12/27/2014, 12/30/2016, 01/02/2020, 01/09/2021   Moderna SARS-COV2 Booster Vaccination 08/08/2021   Moderna Sars-Covid-2 Vaccination 03/25/2019, 04/22/2019, 01/30/2020, 08/20/2020,  12/10/2020   Pfizer Covid-19 Vaccine Bivalent Booster 47yrs & up 01/22/2022   Pneumococcal Conjugate-13 04/21/2013   Pneumococcal Polysaccharide-23 02/27/1993, 03/23/2009   RSV,unspecified 05/25/2022   Tdap 04/20/2012   Zoster Recombinat (Shingrix) 03/29/2021, 06/09/2021   Zoster, Live 11/10/2010   Pertinent  Health Maintenance Due  Topic Date Due   INFLUENZA VACCINE  10/22/2022   DEXA SCAN  Completed      03/27/2021    9:56 PM 03/31/2022   10:18 AM 04/03/2022    2:08 PM 07/13/2022    9:50 AM 08/10/2022   11:01 AM  Fall Risk  Falls in the past year?  0 0 0 0  Was there an injury with Fall?  0 0 0 0  Fall Risk Category Calculator  0 0 0 0  Fall Risk Category (Retired)  Low Low    (RETIRED) Patient Fall Risk Level Moderate fall risk Low fall risk Low fall risk    Patient at Risk for Falls Due to  No Fall Risks No Fall Risks  No Fall Risks  Fall risk Follow up  Falls evaluation completed Falls evaluation completed Falls evaluation completed Falls evaluation completed   Functional Status Survey:    Vitals:   09/03/22 1355  BP: 122/66  Pulse: 70  Resp: 20  Temp: (!) 97.5 F (36.4 C)  SpO2: 93%  Weight: 190 lb (86.2 kg)  Height: 4\' 10"  (1.473 m)   Body  mass index is 39.71 kg/m. Physical Exam Vitals and nursing note reviewed.  Constitutional:      Appearance: Normal appearance.  HENT:     Head: Normocephalic and atraumatic.     Mouth/Throat:     Mouth: Mucous membranes are moist.  Eyes:     Extraocular Movements: Extraocular movements intact.     Conjunctiva/sclera: Conjunctivae normal.     Pupils: Pupils are equal, round, and reactive to light.  Cardiovascular:     Rate and Rhythm: Normal rate and regular rhythm.     Heart sounds:     No gallop.  Pulmonary:     Effort: Pulmonary effort is normal.     Breath sounds: No rales.  Abdominal:     General: Bowel sounds are normal.     Palpations: Abdomen is soft.     Tenderness: There is no abdominal tenderness.   Musculoskeletal:        General: No swelling, tenderness or signs of injury.     Cervical back: Normal range of motion and neck supple.     Right lower leg: Edema present.     Left lower leg: Edema present.     Comments: Trace edema RLE, fingers. Aching muscle in thighs. Right lateral thigh about the patient's hand sized area pain palpated, no skin breakdown, deformity, skin redness or warmth, s/s of trauma or infection   Skin:    General: Skin is warm and dry.  Neurological:     General: No focal deficit present.     Mental Status: She is alert and oriented to person, place, and time. Mental status is at baseline.     Gait: Gait abnormal.  Psychiatric:        Mood and Affect: Mood normal.        Behavior: Behavior normal.        Thought Content: Thought content normal.        Judgment: Judgment normal.     Labs reviewed: Recent Labs    12/05/21 0000 05/28/22 0000  NA 140 140  K 4.4 4.2  CL 109* 107  CO2 24* 23*  BUN 29* 40*  CREATININE 1.4* 1.6*  CALCIUM 8.7 8.3*   Recent Labs    12/05/21 0000  AST 11*  ALT 7  ALKPHOS 49  ALBUMIN 3.9   Recent Labs    12/05/21 0000  WBC 12.2  NEUTROABS 8,747.00  HGB 10.3*  HCT 32*  PLT 268   Lab Results  Component Value Date   TSH 3.56 04/01/2021   Lab Results  Component Value Date   HGBA1C 5.1 02/14/2019   Lab Results  Component Value Date   CHOL 151 08/24/2020   HDL 57 08/24/2020   LDLCALC 70 08/24/2020   TRIG 162 (A) 08/24/2020   CHOLHDL 2.1 06/07/2018    Significant Diagnostic Results in last 30 days:  No results found.  Assessment/Plan Polymyalgia rheumatica (HCC) c/o lateral mid thigh about the patient's palm sized muscle pain palpated, no redness, warmth, or trauma. The patient attributed to PMR, migratory in nature, able to bear weight, treated with increased Prednisone since 08/26/22 w/o significant improvement.  takes Prednisone 5mg  qd(failed GDR), followed by Dr. Kathi Ludwig The agreed to try topical  Voltaren qid, observe.   GERD (gastroesophageal reflux disease) better on  Pantoprazole, Reglan, and prn Maalox, 06/05/22 esophogram barium swallow study, dysmotility, pending GI consultation. normal lipase, Amylase, negative H-pylori 08/20/22  Essential hypertension Blood pressure is controlled, on Metoprolol, Losartan  Memory loss  MMSE  29/38 05/27/22, 07/02/21 CT head w/o CM, No acute abnormality no change from earlier this year. Moderate atrophy and moderate chronic microvascular ischemic change in the white matter. On Memantine.   Gait instability  uses walker for ambulation, w/c to go further.   CKD (chronic kidney disease) stage 3, GFR 30-59 ml/min (HCC) Bun/creat 33/1.31 08/20/22  Anemia due to chronic kidney disease  on B12, Hgb 9.3, Vit B12 1133, Fe 72 08/25/22, FOBT positive x3, pending GI consultation.   Urinary frequency Urinary frequency and occasional incontinent of urine, on Myrbetriq.  Depression, recurrent (HCC) stable, on Venlafaxine  Generalized osteoarthritis of multiple sites  takes Tylenol, Methocarbamol prn, 06/18/22 ortho X-ay R+L hip  Osteoporosis takes Ca, Vit D. 04/29/21 Dr. Kathi Ludwig dc Fosamax.  05/07/21 DEXA t score -1.273  Vitamin B12 deficiency anemia takes Vit B12, Vit B12 1133 08/25/22  Hyperlipidemia LDL goal <70  taking Atorvastatin.   Sleep apnea uses CPAP  CAD (coronary artery disease) on Isosorbide and Ranolazine, Plavix. LDL 70 08/24/20  Hypothyroidism on Levothyroxine, TSH 3.03 12/04/21     Family/ staff Communication: plan of care reviewed with the patient and charge nurse   Labs/tests ordered:  none  Time spend 40 minutes.

## 2022-09-03 NOTE — Assessment & Plan Note (Signed)
Bun/creat 33/1.31 08/20/22

## 2022-09-03 NOTE — Assessment & Plan Note (Signed)
takes Tylenol, Methocarbamol prn, 06/18/22 ortho X-ay R+L hip 

## 2022-09-03 NOTE — Assessment & Plan Note (Signed)
takes Vit B12, Vit B12 1133 08/25/22

## 2022-09-03 NOTE — Assessment & Plan Note (Signed)
on Isosorbide and Ranolazine, Plavix. LDL 70 08/24/20 

## 2022-09-03 NOTE — Assessment & Plan Note (Addendum)
better on  Pantoprazole, Reglan, and prn Maalox, 06/05/22 esophogram barium swallow study, dysmotility, pending GI consultation. normal lipase, Amylase, negative H-pylori 08/20/22

## 2022-09-03 NOTE — Assessment & Plan Note (Addendum)
c/o lateral mid thigh about the patient's palm sized muscle pain palpated, no redness, warmth, or trauma. The patient attributed to PMR, migratory in nature, able to bear weight, treated with increased Prednisone since 08/26/22 w/o significant improvement.  takes Prednisone 5mg  qd(failed GDR), followed by Dr. Kathi Ludwig The agreed to try topical Voltaren qid, observe.

## 2022-09-03 NOTE — Assessment & Plan Note (Signed)
MMSE  29/38 05/27/22, 07/02/21 CT head w/o CM, No acute abnormality no change from earlier this year. Moderate atrophy and moderate chronic microvascular ischemic change in the white matter. On Memantine.  

## 2022-09-03 NOTE — Assessment & Plan Note (Signed)
on Levothyroxine, TSH 3.03 12/04/21 

## 2022-09-03 NOTE — Assessment & Plan Note (Signed)
uses CPAP 

## 2022-09-03 NOTE — Assessment & Plan Note (Signed)
takes Ca, Vit D. 04/29/21 Dr. Syed dc Fosamax.  05/07/21 DEXA t score -1.273 

## 2022-09-03 NOTE — Assessment & Plan Note (Signed)
Urinary frequency and occasional incontinent of urine, on Myrbetriq. 

## 2022-09-03 NOTE — Assessment & Plan Note (Signed)
stable, on Venlafaxine 

## 2022-09-03 NOTE — Assessment & Plan Note (Signed)
taking Atorvastatin.  ?

## 2022-09-03 NOTE — Assessment & Plan Note (Signed)
on B12, Hgb 9.3, Vit B12 1133, Fe 72 08/25/22, FOBT positive x3, pending GI consultation.

## 2022-09-09 ENCOUNTER — Encounter: Payer: Self-pay | Admitting: Nurse Practitioner

## 2022-09-09 ENCOUNTER — Non-Acute Institutional Stay: Payer: Medicare Other | Admitting: Nurse Practitioner

## 2022-09-09 DIAGNOSIS — G4733 Obstructive sleep apnea (adult) (pediatric): Secondary | ICD-10-CM

## 2022-09-09 DIAGNOSIS — I2510 Atherosclerotic heart disease of native coronary artery without angina pectoris: Secondary | ICD-10-CM | POA: Diagnosis not present

## 2022-09-09 DIAGNOSIS — M353 Polymyalgia rheumatica: Secondary | ICD-10-CM | POA: Diagnosis not present

## 2022-09-09 DIAGNOSIS — I10 Essential (primary) hypertension: Secondary | ICD-10-CM

## 2022-09-09 DIAGNOSIS — M159 Polyosteoarthritis, unspecified: Secondary | ICD-10-CM

## 2022-09-09 DIAGNOSIS — E785 Hyperlipidemia, unspecified: Secondary | ICD-10-CM

## 2022-09-09 DIAGNOSIS — D631 Anemia in chronic kidney disease: Secondary | ICD-10-CM

## 2022-09-09 DIAGNOSIS — E039 Hypothyroidism, unspecified: Secondary | ICD-10-CM

## 2022-09-09 DIAGNOSIS — F339 Major depressive disorder, recurrent, unspecified: Secondary | ICD-10-CM

## 2022-09-09 DIAGNOSIS — N1831 Chronic kidney disease, stage 3a: Secondary | ICD-10-CM

## 2022-09-09 DIAGNOSIS — M8000XA Age-related osteoporosis with current pathological fracture, unspecified site, initial encounter for fracture: Secondary | ICD-10-CM

## 2022-09-09 DIAGNOSIS — K219 Gastro-esophageal reflux disease without esophagitis: Secondary | ICD-10-CM

## 2022-09-09 DIAGNOSIS — R350 Frequency of micturition: Secondary | ICD-10-CM

## 2022-09-09 DIAGNOSIS — D519 Vitamin B12 deficiency anemia, unspecified: Secondary | ICD-10-CM

## 2022-09-09 NOTE — Assessment & Plan Note (Signed)
on B12, Hgb 9.3, Vit B12 1133, Fe 72 08/25/22, FOBT positive x3, pending GI consultation.  

## 2022-09-09 NOTE — Assessment & Plan Note (Signed)
uses CPAP 

## 2022-09-09 NOTE — Progress Notes (Signed)
Location:  [facility] Room Number: Al/911/A Place of Service:  ALF (13) Provider:  Sebasthian Stailey X, NP  Patient Care Team: Brenee Gajda X, NP as PCP - General (Internal Medicine) Corky Crafts, MD as PCP - Cardiology (Cardiology) Charlott Rakes, MD as Consulting Physician (Gastroenterology) August Saucer, Corrie Mckusick, MD as Consulting Physician (Orthopedic Surgery) Levert Feinstein, MD as Consulting Physician (Neurology) Marcine Matar, MD as Consulting Physician (Urology) Donalee Citrin, MD as Consulting Physician (Neurosurgery)  Extended Emergency Contact Information Primary Emergency Contact: Darlin Priestly Address: [address]          [place] [address] [place] Home Phone: [phone] Mobile Phone: [phone] Relation: Spouse Secondary Emergency Contact: Cassin,NATHAN Mobile Phone: [phone] Relation: Son  Code Status:  DNR Goals of care: Advanced Directive information    09/09/2022    8:58 AM  Advanced Directives  Does Patient Have a Medical Advance Directive? Yes  Type of Advance Directive Living will;Healthcare Power of [place];Out of facility DNR (pink MOST or yellow form)  Does patient want to make changes to medical advance directive? No - Patient declined  Copy of Healthcare Power of Attorney in Chart? Yes - validated most recent copy scanned in chart (See row information)  Pre-existing out of facility DNR order (yellow form or pink MOST form) Pink MOST form placed in chart (order not valid for inpatient use);Yellow form placed in chart (order not valid for inpatient use)     Chief Complaint  Patient presents with  . Medical Management of Chronic Issues    Patient is here for a follow up for chronic conditions Patient is also due for updated Tdap vaccine    HPI:  Pt is a 82 y.o. female seen today for medical management of chronic diseases.     Persisted the right lateral mid thigh about the patient's  palm sized muscle pain palpated, no redness, warmth, or trauma. 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              GERD, better on  Pantoprazole, Reglan, and prn Maalox, 06/05/22 esophogram barium swallow study, dysmotility, normal lipase, Amylase, negative H-pylori 08/20/22             PMR, takes Prednisone 5mg  qd(failed GDR), followed by Dr. Kathi Ludwig HTN, on Metoprolol, Losartan, Bun/creat 33/1.31 08/20/22             Memory loss: MMSE  29/38 05/27/22, 07/02/21 CT head w/o CM, No acute abnormality no change from earlier this year. Moderate atrophy and moderate chronic microvascular ischemic change in the white matter. On Memantine.              Gait abnormality, uses walker for ambulation, w/c to go further.  CKD Bun/creat 33/1.31 08/20/22             CAD, on Isosorbide and Ranolazine, Plavix. LDL 70 08/24/20             Hypothyroidism, on Levothyroxine, TSH 3.03 12/04/21             Anemia, on B12, Hgb 9.3, Vit B12 1133, Fe 72 08/25/22, FOBT positive x3, pending GI consultation.              Urinary frequency and occasional incontinent of urine, on Myrbetriq.             Depression,  stable, on Venlafaxine             OA, takes Tylenol, Methocarbamol prn, 06/18/22 ortho X-ay R+L hip             OP, takes Ca, Vit D. 04/29/21 Dr. Kathi Ludwig dc Fosamax.  05/07/21 DEXA t score -1.273             Vit B12 deficiency, takes Vit B12, Vit B12 1133 08/25/22             Hyperlipidemia, taking Atorvastatin.              OSA, uses CPAP                    Past Medical History:  Diagnosis Date  . Anemia   . Arthritis    "fingers, back, shoulders, hips" (04/10/2016)  . Chronic diastolic CHF (congestive heart failure) (HCC)    a. normal EF, LVEDP at [facility] in 6/17 33 >> Lasix started   . CKD (chronic kidney disease), stage III (HCC)   . Coronary artery disease     a. s/p CABG 2000 (SVG/ free LIMA Y graft to the diagonal and distal LAD, SVG to the OM 1, and SVG to the PDA) . b. Cath 12/19/2014 90% dSVG to RCA s/p 2 overlapping DES. c. LHC 2016, 2017 no intervention except diuresis needed. d. Low risk nuc 03/2016. e. Cath 12/2017 patent LIMA-LAD, SVG-OM, SVG-PDA but occluded SVG to diag.   . Depression    with anxious component  . GERD (gastroesophageal reflux disease)   . History of hiatal hernia   . Hyperlipidemia   . Hypothyroidism   . Obesity   . OSA on CPAP   . PMR (polymyalgia rheumatica) (HCC)   . Pneumonia    "2-3 times" (04/10/2016)  . Stable angina    microvascular, improved with Ranexa  . Subclavian artery stenosis (HCC)    a. L by cath note in 2016.  Marland Kitchen TIA (transient ischemic attack)    Past Surgical History:  Procedure Laterality Date  . BREAST BIOPSY Right 1964  . CARDIAC CATHETERIZATION  08   patent grafts, no culprit lesions, EF 65%  . CARDIAC CATHETERIZATION N/A 12/19/2014   Procedure: Left Heart Cath and Cors/Grafts Angiography;  Surgeon: Corky Crafts, MD;  Location: [facility] INVASIVE CV LAB;  Service: Cardiovascular;  Laterality: N/A;  . CARDIAC CATHETERIZATION N/A 12/19/2014   Procedure: Coronary Stent Intervention;  Surgeon: Corky Crafts, MD;  Location: [facility] INVASIVE CV LAB;  Service: Cardiovascular;  Laterality: N/A;  . CARDIAC CATHETERIZATION N/A 01/01/2015   Procedure: Left Heart Cath and Cors/Grafts Angiography;  Surgeon: Corky Crafts, MD;  Location: [facility] INVASIVE CV LAB;  Service: Cardiovascular;  Laterality: N/A;  . CARDIAC CATHETERIZATION N/A 09/20/2015   Procedure: Left Heart Cath and Cors/Grafts Angiography;  Surgeon: Corky Crafts, MD;  Location: [facility] INVASIVE CV LAB;  Service: Cardiovascular;  Laterality: N/A;  . CATARACT EXTRACTION W/ INTRAOCULAR LENS  IMPLANT, BILATERAL Bilateral 2011  . COLONOSCOPY WITH PROPOFOL N/A 07/27/2016   Procedure: COLONOSCOPY WITH PROPOFOL;  Surgeon: Charlott Rakes, MD;   Location: [facility] ENDOSCOPY;  Service: Endoscopy;  Laterality: N/A;  . CORONARY ARTERY BYPASS GRAFT  2000   ASCVD, multivessel, S./P.  . DILATION AND CURETTAGE OF UTERUS  1980s  . ESOPHAGOGASTRODUODENOSCOPY (EGD) WITH PROPOFOL N/A 07/27/2016   Procedure: ESOPHAGOGASTRODUODENOSCOPY (EGD) WITH PROPOFOL;  Surgeon: Charlott Rakes, MD;  Location: [facility] ENDOSCOPY;  Service: Endoscopy;  Laterality: N/A;  . FRACTURE  SURGERY    . LEFT HEART CATH AND CORS/GRAFTS ANGIOGRAPHY N/A 01/12/2018   Procedure: LEFT HEART CATH AND CORS/GRAFTS ANGIOGRAPHY;  Surgeon: Corky Crafts, MD;  Location: [facility] INVASIVE CV LAB;  Service: Cardiovascular;  Laterality: N/A;  . PATELLA FRACTURE SURGERY Left 1993    Allergies  Allergen Reactions  . Nsaids Other (See Comments)    Due to chronic kidney failure  . Butorphanol Other (See Comments)    agitation Constipation Produced a lot of urine, made patient feel crazy  . Sulfa Antibiotics Rash  . Bisoprolol Fumarate Other (See Comments)    Doesn't remember  . Butorphanol Tartrate Other (See Comments)    Produced a lot of urine, made patient feel crazy  . Codeine Nausea And Vomiting  . Demerol [Meperidine] Nausea And Vomiting  . Imipramine     Sweating, facial dysfunction   . Meperidine And Related Nausea And Vomiting  . Statins     MYALGIAS  . Tequin [Gatifloxacin] Other (See Comments)    Caused hypoglycemia Low blood sugar  . Brilinta [Ticagrelor] Rash    CAUSES PETECHIAE, PURPURA  . Pseudoephedrine Hcl Palpitations  . Septra [Sulfamethoxazole-Trimethoprim] Rash  . Sulfamethoxazole-Trimethoprim Rash    Outpatient Encounter Medications as of 09/09/2022  Medication Sig  . acetaminophen (TYLENOL) 500 MG tablet Take 1,000 mg by mouth 2 (two) times daily as needed.  Marland Kitchen alum & mag hydroxide-simeth (MAALOX PLUS) 400-400-40 MG/5ML suspension Take 10 mLs by mouth as needed for indigestion.  Marland Kitchen atorvastatin (LIPITOR) 10 MG tablet TAKE 1 TABLET ONCE DAILY.  .  Calcium-Phosphorus-Vitamin D (CALCIUM/VITAMIN D3/ADULT GUMMY) 250-100-500 MG-MG-UNIT CHEW Chew 1 Piece of gum by mouth 2 (two) times daily.  . Cholecalciferol (VITAMIN D) 50 MCG (2000 UT) tablet Take 2,000 Units by mouth daily.  . clopidogrel (PLAVIX) 75 MG tablet TAKE 1 TABLET ONCE DAILY.  Marland Kitchen COENZYME Q-10 PO Take 10 mg by mouth at bedtime.  . cyanocobalamin 1000 MCG tablet Take 1,000 mcg by mouth daily.  . fluticasone (FLONASE) 50 MCG/ACT nasal spray Place 2 sprays into both nostrils daily as needed for allergies.   . furosemide (LASIX) 20 MG tablet Take 20 mg by mouth daily.  . isosorbide mononitrate (IMDUR) 60 MG 24 hr tablet Take 2 tablets (120 mg total) by mouth daily.  Marland Kitchen ketoconazole (NIZORAL) 2 % shampoo Apply 1 Application topically 2 (two) times a week. Tuesday and Friday  . levocetirizine (XYZAL) 5 MG tablet TAKE 1 TABLET ONCE DAILY IN THE EVENING.  Marland Kitchen levothyroxine (SYNTHROID) 75 MCG tablet TAKE 1 TABLET ONCE DAILY ON EMPTY STOMACH.  Marland Kitchen losartan (COZAAR) 25 MG tablet Take 25 mg by mouth daily.  . melatonin 1 MG TABS tablet Take 1 mg by mouth at bedtime.  . memantine (NAMENDA) 10 MG tablet Take 10 mg by mouth 2 (two) times daily.  . Menthol, Topical Analgesic, (BIOFREEZE) 10 % LIQD Apply topically as needed.  . methocarbamol (ROBAXIN) 750 MG tablet Take 750 mg by mouth every 6 (six) hours as needed for muscle spasms.  . metoCLOPramide (REGLAN) 5 MG tablet Take 5 mg by mouth in the morning and at bedtime.  . metoprolol succinate (TOPROL-XL) 25 MG 24 hr tablet Take 25 mg by mouth daily. Take with or immediately following a meal.  . miconazole (MICOTIN) 2 % powder Apply topically 2 (two) times daily.  Apply to groin and pannus topically  . MYRBETRIQ 50 MG TB24 tablet TAKE 1 TABLET BY MOUTH DAILY.  . nitroGLYCERIN (NITROSTAT) 0.4 MG SL tablet  Place 0.4 mg under the tongue every 5 (five) minutes as needed for chest pain. X 3 doses  . pantoprazole (PROTONIX) 40 MG tablet Take 1 tablet (40 mg  total) by mouth 2 (two) times daily.  . polyethylene glycol (MIRALAX / GLYCOLAX) 17 g packet Take 17 g by mouth daily.  . potassium chloride (MICRO-K) 10 MEQ CR capsule Take 20 mEq by mouth 2 (two) times daily.  . predniSONE (DELTASONE) 5 MG tablet Take 5 mg by mouth daily.  . PSYLLIUM HUSK PO Take 0.4 g by mouth daily. May keep at bedside  . ranolazine (RANEXA) 1000 MG SR tablet TAKE 1 TABLET BY MOUTH TWICE DAILY.  Marland Kitchen venlafaxine (EFFEXOR) 75 MG tablet Take 150 mg by mouth. 2 caps= 150 mg; oral  Once A Day  . zinc oxide 20 % ointment Apply 1 application topically as needed for irritation.   No facility-administered encounter medications on file as of 09/09/2022.    Review of Systems  Constitutional:  Negative for activity change, appetite change and fever.  HENT:  Positive for hearing loss. Negative for congestion and voice change.   Eyes:  Negative for visual disturbance.  Respiratory:  Positive for shortness of breath. Negative for cough and wheezing.   Cardiovascular:  Positive for leg swelling.  Gastrointestinal:  Negative for abdominal pain and constipation.       Heart burn, indigestion-improved since Reglan started.   Genitourinary:  Negative for dysuria, frequency and urgency.       1-2x/night bathroom trips.   Musculoskeletal:  Positive for arthralgias, gait problem and myalgias.       Right lateral thigh about the patient's hand sized area pain palpated, no skin breakdown, deformity, skin redness or warmth, s/s of trauma or infection  Skin:  Negative for color change.  Neurological:  Negative for speech difficulty, weakness and headaches.       Memory lapses.   Psychiatric/Behavioral:  Negative for behavioral problems, hallucinations and sleep disturbance. The patient is not nervous/anxious.     Immunization History  Administered Date(s) Administered  . Fluad Quad(high Dose 65+) 01/12/2022  . HPV Bivalent 11/24/2013, 01/17/2016  . Influenza Whole 12/23/2017  . Influenza,  High Dose Seasonal PF 12/24/2018, 12/24/2018  . Influenza-Unspecified 01/10/2013, 12/27/2014, 12/30/2016, 01/02/2020, 01/09/2021  . Moderna SARS-COV2 Booster Vaccination 08/08/2021  . Moderna Sars-Covid-2 Vaccination 03/25/2019, 04/22/2019, 01/30/2020, 08/20/2020, 12/10/2020  . [employer], [employer] 58yrs & up 01/22/2022  . Pneumococcal Conjugate-13 04/21/2013  . Pneumococcal Polysaccharide-23 02/27/1993, 03/23/2009  . RSV,unspecified 05/25/2022  . Tdap 03/23/2012, 04/20/2012  . Zoster Recombinat (Shingrix) 03/29/2021, 06/09/2021  . Zoster, Live 11/10/2010   Pertinent  Health Maintenance Due  Topic Date Due  . INFLUENZA VACCINE  10/22/2022  . DEXA SCAN  Completed      03/31/2022   10:18 AM 04/03/2022    2:08 PM 07/13/2022    9:50 AM 08/10/2022   11:01 AM 09/09/2022    8:58 AM  Fall Risk  Falls in the past year? 0 0 0 0 0  Was there an injury with Fall? 0 0 0 0 0  Fall Risk Category Calculator 0 0 0 0 0  Fall Risk Category (Retired) Low Low     (RETIRED) Patient Fall Risk Level Low fall risk Low fall risk     Patient at Risk for Falls Due to No Fall Risks No Fall Risks  No Fall Risks No Fall Risks  Fall risk Follow up Falls evaluation completed Falls evaluation completed Falls  evaluation completed Falls evaluation completed Falls evaluation completed   Functional Status Survey:    Vitals:   09/09/22 0857  BP: (!) 146/64  Pulse: 70  Resp: 20  Temp: (!) 97.5 F (36.4 C)  SpO2: 93%  Weight: 190 lb 3.2 oz (86.3 kg)  Height: 4\' 10"  (1.473 m)   Body mass index is 39.75 kg/m. Physical Exam Vitals and nursing note reviewed.  Constitutional:      Appearance: Normal appearance.  HENT:     Head: Normocephalic and atraumatic.     Mouth/Throat:     Mouth: Mucous membranes are moist.  Eyes:     Extraocular Movements: Extraocular movements intact.     Conjunctiva/sclera: Conjunctivae normal.     Pupils: Pupils are equal, round, and reactive to light.   Cardiovascular:     Rate and Rhythm: Normal rate and regular rhythm.     Heart sounds:     No gallop.  Pulmonary:     Effort: Pulmonary effort is normal.     Breath sounds: No rales.  Abdominal:     General: Bowel sounds are normal.     Palpations: Abdomen is soft.     Tenderness: There is no abdominal tenderness.  Musculoskeletal:        General: No swelling, tenderness or signs of injury.     Cervical back: Normal range of motion and neck supple.     Right lower leg: Edema present.     Left lower leg: Edema present.     Comments: Trace edema RLE, fingers. Aching muscle in thighs. Right lateral thigh about the patient's hand sized area pain palpated, no skin breakdown, deformity, skin redness or warmth, s/s of trauma or infection   Skin:    General: Skin is warm and dry.  Neurological:     General: No focal deficit present.     Mental Status: She is alert and oriented to person, place, and time. Mental status is at baseline.     Gait: Gait abnormal.  Psychiatric:        Mood and Affect: Mood normal.        Behavior: Behavior normal.        Thought Content: Thought content normal.        Judgment: Judgment normal.    Labs reviewed: Recent Labs    12/05/21 0000 05/28/22 0000  NA 140 140  K 4.4 4.2  CL 109* 107  CO2 24* 23*  BUN 29* 40*  CREATININE 1.4* 1.6*  CALCIUM 8.7 8.3*   Recent Labs    12/05/21 0000  AST 11*  ALT 7  ALKPHOS 49  ALBUMIN 3.9   Recent Labs    12/05/21 0000  WBC 12.2  NEUTROABS 8,747.00  HGB 10.3*  HCT 32*  PLT 268   Lab Results  Component Value Date   TSH 3.56 04/01/2021   Lab Results  Component Value Date   HGBA1C 5.1 02/14/2019   Lab Results  Component Value Date   CHOL 151 08/24/2020   HDL 57 08/24/2020   LDLCALC 70 08/24/2020   TRIG 162 (A) 08/24/2020   CHOLHDL 2.1 06/07/2018    Significant Diagnostic Results in last 30 days:  No results found.  Assessment/Plan No problem-specific Assessment & Plan notes found  for this encounter.     Family/ staff Communication: plan of care reviewed with the patient and charge nurse.   Labs/tests ordered:  none  Time spend 40 minutes.

## 2022-09-09 NOTE — Assessment & Plan Note (Signed)
on Levothyroxine, TSH 3.03 12/04/21 

## 2022-09-09 NOTE — Assessment & Plan Note (Signed)
Bun/creat 33/1.31 08/20/22 

## 2022-09-09 NOTE — Assessment & Plan Note (Signed)
takes Tylenol, Methocarbamol prn, 06/18/22 ortho X-ay R+L hip 

## 2022-09-09 NOTE — Assessment & Plan Note (Signed)
Persisted the right lateral mid thigh about the patient's palm sized muscle pain palpated, no redness, warmth, or trauma. 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  takes Prednisone 5mg  qd(failed GDR), followed by Dr. Kathi Ludwig, the patient declined increasing Prednisone.

## 2022-09-09 NOTE — Assessment & Plan Note (Signed)
stable, on Venlafaxine 

## 2022-09-09 NOTE — Assessment & Plan Note (Signed)
on Isosorbide and Ranolazine, Plavix. LDL 70 08/24/20 

## 2022-09-09 NOTE — Assessment & Plan Note (Signed)
taking Atorvastatin.  ?

## 2022-09-09 NOTE — Assessment & Plan Note (Signed)
better on  Pantoprazole, Reglan, and prn Maalox, 06/05/22 esophogram barium swallow study, dysmotility, normal lipase, Amylase, negative H-pylori 08/20/22

## 2022-09-09 NOTE — Assessment & Plan Note (Signed)
Urinary frequency and occasional incontinent of urine, on Myrbetriq. 

## 2022-09-09 NOTE — Assessment & Plan Note (Signed)
,   takes Ca, Vit D. 04/29/21 Dr. Syed dc Fosamax.  05/07/21 DEXA t score -1.273 

## 2022-09-09 NOTE — Assessment & Plan Note (Signed)
takes Vit B12, Vit B12 1133 08/25/22 

## 2022-09-09 NOTE — Assessment & Plan Note (Signed)
Blood pressure is controlled,  on Metoprolol, Losartan, Bun/creat 33/1.31 08/20/22

## 2022-09-18 ENCOUNTER — Other Ambulatory Visit: Payer: Self-pay | Admitting: Nurse Practitioner

## 2022-09-18 DIAGNOSIS — M159 Polyosteoarthritis, unspecified: Secondary | ICD-10-CM

## 2022-09-18 DIAGNOSIS — K219 Gastro-esophageal reflux disease without esophagitis: Secondary | ICD-10-CM

## 2022-09-21 ENCOUNTER — Emergency Department (HOSPITAL_COMMUNITY): Payer: Medicare Other

## 2022-09-21 ENCOUNTER — Other Ambulatory Visit: Payer: Self-pay

## 2022-09-21 ENCOUNTER — Inpatient Hospital Stay (HOSPITAL_COMMUNITY)
Admission: EM | Admit: 2022-09-21 | Discharge: 2022-10-02 | DRG: 291 | Disposition: A | Discharge status: Skilled Nursing Facility | Payer: Medicare Other | Source: Skilled Nursing Facility | Attending: Internal Medicine | Admitting: Internal Medicine

## 2022-09-21 ENCOUNTER — Encounter (HOSPITAL_COMMUNITY): Payer: Self-pay | Admitting: Internal Medicine

## 2022-09-21 DIAGNOSIS — M353 Polymyalgia rheumatica: Secondary | ICD-10-CM | POA: Diagnosis present

## 2022-09-21 DIAGNOSIS — I5020 Unspecified systolic (congestive) heart failure: Secondary | ICD-10-CM

## 2022-09-21 DIAGNOSIS — I2489 Other forms of acute ischemic heart disease: Secondary | ICD-10-CM | POA: Diagnosis present

## 2022-09-21 DIAGNOSIS — Z7189 Other specified counseling: Secondary | ICD-10-CM | POA: Diagnosis not present

## 2022-09-21 DIAGNOSIS — J9601 Acute respiratory failure with hypoxia: Secondary | ICD-10-CM | POA: Diagnosis present

## 2022-09-21 DIAGNOSIS — I5043 Acute on chronic combined systolic (congestive) and diastolic (congestive) heart failure: Secondary | ICD-10-CM | POA: Diagnosis present

## 2022-09-21 DIAGNOSIS — D649 Anemia, unspecified: Secondary | ICD-10-CM

## 2022-09-21 DIAGNOSIS — J440 Chronic obstructive pulmonary disease with acute lower respiratory infection: Secondary | ICD-10-CM | POA: Diagnosis present

## 2022-09-21 DIAGNOSIS — Z7902 Long term (current) use of antithrombotics/antiplatelets: Secondary | ICD-10-CM

## 2022-09-21 DIAGNOSIS — J810 Acute pulmonary edema: Secondary | ICD-10-CM | POA: Diagnosis not present

## 2022-09-21 DIAGNOSIS — Z7952 Long term (current) use of systemic steroids: Secondary | ICD-10-CM

## 2022-09-21 DIAGNOSIS — Z886 Allergy status to analgesic agent status: Secondary | ICD-10-CM

## 2022-09-21 DIAGNOSIS — I429 Cardiomyopathy, unspecified: Secondary | ICD-10-CM | POA: Diagnosis present

## 2022-09-21 DIAGNOSIS — F03918 Unspecified dementia, unspecified severity, with other behavioral disturbance: Secondary | ICD-10-CM | POA: Diagnosis present

## 2022-09-21 DIAGNOSIS — Z515 Encounter for palliative care: Secondary | ICD-10-CM | POA: Diagnosis not present

## 2022-09-21 DIAGNOSIS — Z1152 Encounter for screening for COVID-19: Secondary | ICD-10-CM

## 2022-09-21 DIAGNOSIS — R413 Other amnesia: Secondary | ICD-10-CM | POA: Diagnosis present

## 2022-09-21 DIAGNOSIS — J441 Chronic obstructive pulmonary disease with (acute) exacerbation: Secondary | ICD-10-CM | POA: Diagnosis present

## 2022-09-21 DIAGNOSIS — N1832 Chronic kidney disease, stage 3b: Secondary | ICD-10-CM | POA: Diagnosis present

## 2022-09-21 DIAGNOSIS — K219 Gastro-esophageal reflux disease without esophagitis: Secondary | ICD-10-CM | POA: Diagnosis present

## 2022-09-21 DIAGNOSIS — E039 Hypothyroidism, unspecified: Secondary | ICD-10-CM | POA: Diagnosis present

## 2022-09-21 DIAGNOSIS — I739 Peripheral vascular disease, unspecified: Secondary | ICD-10-CM | POA: Diagnosis present

## 2022-09-21 DIAGNOSIS — Z87891 Personal history of nicotine dependence: Secondary | ICD-10-CM

## 2022-09-21 DIAGNOSIS — J189 Pneumonia, unspecified organism: Secondary | ICD-10-CM | POA: Diagnosis present

## 2022-09-21 DIAGNOSIS — Z881 Allergy status to other antibiotic agents status: Secondary | ICD-10-CM

## 2022-09-21 DIAGNOSIS — Z882 Allergy status to sulfonamides status: Secondary | ICD-10-CM

## 2022-09-21 DIAGNOSIS — Z6839 Body mass index (BMI) 39.0-39.9, adult: Secondary | ICD-10-CM

## 2022-09-21 DIAGNOSIS — G4700 Insomnia, unspecified: Secondary | ICD-10-CM | POA: Diagnosis not present

## 2022-09-21 DIAGNOSIS — I5033 Acute on chronic diastolic (congestive) heart failure: Secondary | ICD-10-CM | POA: Diagnosis present

## 2022-09-21 DIAGNOSIS — Z79899 Other long term (current) drug therapy: Secondary | ICD-10-CM | POA: Diagnosis not present

## 2022-09-21 DIAGNOSIS — I2510 Atherosclerotic heart disease of native coronary artery without angina pectoris: Secondary | ICD-10-CM | POA: Diagnosis present

## 2022-09-21 DIAGNOSIS — Z833 Family history of diabetes mellitus: Secondary | ICD-10-CM

## 2022-09-21 DIAGNOSIS — E785 Hyperlipidemia, unspecified: Secondary | ICD-10-CM | POA: Diagnosis present

## 2022-09-21 DIAGNOSIS — N179 Acute kidney failure, unspecified: Secondary | ICD-10-CM | POA: Diagnosis present

## 2022-09-21 DIAGNOSIS — R0602 Shortness of breath: Secondary | ICD-10-CM | POA: Diagnosis present

## 2022-09-21 DIAGNOSIS — I130 Hypertensive heart and chronic kidney disease with heart failure and stage 1 through stage 4 chronic kidney disease, or unspecified chronic kidney disease: Secondary | ICD-10-CM | POA: Diagnosis present

## 2022-09-21 DIAGNOSIS — I25810 Atherosclerosis of coronary artery bypass graft(s) without angina pectoris: Secondary | ICD-10-CM | POA: Diagnosis present

## 2022-09-21 DIAGNOSIS — R4589 Other symptoms and signs involving emotional state: Secondary | ICD-10-CM | POA: Diagnosis not present

## 2022-09-21 DIAGNOSIS — Z8249 Family history of ischemic heart disease and other diseases of the circulatory system: Secondary | ICD-10-CM

## 2022-09-21 DIAGNOSIS — I10 Essential (primary) hypertension: Secondary | ICD-10-CM | POA: Diagnosis present

## 2022-09-21 DIAGNOSIS — I350 Nonrheumatic aortic (valve) stenosis: Secondary | ICD-10-CM | POA: Diagnosis present

## 2022-09-21 DIAGNOSIS — E876 Hypokalemia: Secondary | ICD-10-CM | POA: Diagnosis present

## 2022-09-21 DIAGNOSIS — Z8673 Personal history of transient ischemic attack (TIA), and cerebral infarction without residual deficits: Secondary | ICD-10-CM

## 2022-09-21 DIAGNOSIS — D519 Vitamin B12 deficiency anemia, unspecified: Secondary | ICD-10-CM | POA: Diagnosis present

## 2022-09-21 DIAGNOSIS — I5031 Acute diastolic (congestive) heart failure: Secondary | ICD-10-CM | POA: Diagnosis not present

## 2022-09-21 DIAGNOSIS — R7989 Other specified abnormal findings of blood chemistry: Secondary | ICD-10-CM | POA: Diagnosis present

## 2022-09-21 DIAGNOSIS — Z885 Allergy status to narcotic agent status: Secondary | ICD-10-CM

## 2022-09-21 DIAGNOSIS — Z66 Do not resuscitate: Secondary | ICD-10-CM | POA: Diagnosis present

## 2022-09-21 DIAGNOSIS — Z823 Family history of stroke: Secondary | ICD-10-CM

## 2022-09-21 DIAGNOSIS — Z79890 Hormone replacement therapy (postmenopausal): Secondary | ICD-10-CM

## 2022-09-21 LAB — BLOOD GAS, VENOUS
Acid-Base Excess: 0.3 mmol/L (ref 0.0–2.0)
Bicarbonate: 25.4 mmol/L (ref 20.0–28.0)
O2 Saturation: 46.3 %
Patient temperature: 37
pCO2, Ven: 42 mmHg — ABNORMAL LOW (ref 44–60)
pH, Ven: 7.39 (ref 7.25–7.43)
pO2, Ven: 31 mmHg — CL (ref 32–45)

## 2022-09-21 LAB — COMPREHENSIVE METABOLIC PANEL
ALT: 13 U/L (ref 0–44)
AST: 15 U/L (ref 15–41)
Albumin: 3 g/dL — ABNORMAL LOW (ref 3.5–5.0)
Alkaline Phosphatase: 37 U/L — ABNORMAL LOW (ref 38–126)
Anion gap: 12 (ref 5–15)
BUN: 30 mg/dL — ABNORMAL HIGH (ref 8–23)
CO2: 20 mmol/L — ABNORMAL LOW (ref 22–32)
Calcium: 7.8 mg/dL — ABNORMAL LOW (ref 8.9–10.3)
Chloride: 107 mmol/L (ref 98–111)
Creatinine, Ser: 1.35 mg/dL — ABNORMAL HIGH (ref 0.44–1.00)
GFR, Estimated: 39 mL/min — ABNORMAL LOW (ref >60)
Glucose, Bld: 122 mg/dL — ABNORMAL HIGH (ref 70–99)
Potassium: 3.9 mmol/L (ref 3.5–5.1)
Sodium: 139 mmol/L (ref 135–145)
Total Bilirubin: 0.5 mg/dL (ref 0.3–1.2)
Total Protein: 6.1 g/dL — ABNORMAL LOW (ref 6.5–8.1)

## 2022-09-21 LAB — CBC WITH DIFFERENTIAL/PLATELET
Abs Immature Granulocytes: 0.1 10*3/uL — ABNORMAL HIGH (ref 0.00–0.07)
Basophils Absolute: 0 10*3/uL (ref 0.0–0.1)
Basophils Relative: 0 %
Eosinophils Absolute: 0.1 10*3/uL (ref 0.0–0.5)
Eosinophils Relative: 1 %
HCT: 29.1 % — ABNORMAL LOW (ref 36.0–46.0)
Hemoglobin: 9 g/dL — ABNORMAL LOW (ref 12.0–15.0)
Immature Granulocytes: 1 %
Lymphocytes Relative: 11 %
Lymphs Abs: 1.7 10*3/uL (ref 0.7–4.0)
MCH: 32.5 pg (ref 26.0–34.0)
MCHC: 30.9 g/dL (ref 30.0–36.0)
MCV: 105.1 fL — ABNORMAL HIGH (ref 80.0–100.0)
Monocytes Absolute: 1.2 10*3/uL — ABNORMAL HIGH (ref 0.1–1.0)
Monocytes Relative: 7 %
Neutro Abs: 12.9 10*3/uL — ABNORMAL HIGH (ref 1.7–7.7)
Neutrophils Relative %: 80 %
Platelets: 310 10*3/uL (ref 150–400)
RBC: 2.77 MIL/uL — ABNORMAL LOW (ref 3.87–5.11)
RDW: 13.8 % (ref 11.5–15.5)
WBC: 16 10*3/uL — ABNORMAL HIGH (ref 4.0–10.5)
nRBC: 0.1 % (ref 0.0–0.2)

## 2022-09-21 LAB — RESP PANEL BY RT-PCR (RSV, FLU A&B, COVID)  RVPGX2
Influenza A by PCR: NEGATIVE
Influenza B by PCR: NEGATIVE
Resp Syncytial Virus by PCR: NEGATIVE
SARS Coronavirus 2 by RT PCR: NEGATIVE

## 2022-09-21 LAB — TROPONIN I (HIGH SENSITIVITY)
Troponin I (High Sensitivity): 278 ng/L (ref <18)
Troponin I (High Sensitivity): 270 ng/L (ref <18)

## 2022-09-21 LAB — MRSA NEXT GEN BY PCR, NASAL: MRSA by PCR Next Gen: NOT DETECTED

## 2022-09-21 LAB — MAGNESIUM: Magnesium: 1.7 mg/dL (ref 1.7–2.4)

## 2022-09-21 LAB — BRAIN NATRIURETIC PEPTIDE: B Natriuretic Peptide: 1723.8 pg/mL — ABNORMAL HIGH (ref 0.0–100.0)

## 2022-09-21 MED ORDER — POTASSIUM CHLORIDE CRYS ER 20 MEQ PO TBCR
40.0000 meq | EXTENDED_RELEASE_TABLET | Freq: Once | ORAL | Status: AC
  Administered 2022-09-21: 40 meq via ORAL
  Filled 2022-09-21 (×2): qty 2

## 2022-09-21 MED ORDER — VENLAFAXINE HCL 75 MG PO TABS: 150.0000 mg | ORAL_TABLET | Freq: Every day | ORAL | Status: DC

## 2022-09-21 MED ORDER — MEMANTINE HCL 10 MG PO TABS
10.0000 mg | ORAL_TABLET | Freq: Two times a day (BID) | ORAL | Status: DC
  Administered 2022-09-21 – 2022-10-02 (×22): 10 mg via ORAL
  Filled 2022-09-21: qty 1
  Filled 2022-09-21: qty 2
  Filled 2022-09-21 (×4): qty 1
  Filled 2022-09-21 (×2): qty 2
  Filled 2022-09-21 (×3): qty 1
  Filled 2022-09-21: qty 2
  Filled 2022-09-21: qty 1
  Filled 2022-09-21 (×2): qty 2
  Filled 2022-09-21 (×7): qty 1

## 2022-09-21 MED ORDER — FUROSEMIDE 10 MG/ML IJ SOLN
40.0000 mg | Freq: Two times a day (BID) | INTRAMUSCULAR | Status: DC
  Administered 2022-09-21 – 2022-09-22 (×3): 40 mg via INTRAVENOUS
  Filled 2022-09-21 (×3): qty 4

## 2022-09-21 MED ORDER — DIAZEPAM 5 MG/ML IJ SOLN
2.5000 mg | Freq: Once | INTRAMUSCULAR | Status: AC
  Administered 2022-09-21: 2.5 mg via INTRAVENOUS
  Filled 2022-09-21: qty 2

## 2022-09-21 MED ORDER — ORAL CARE MOUTH RINSE
15.0000 mL | OROMUCOSAL | Status: DC
  Administered 2022-09-21 – 2022-09-30 (×31): 15 mL via OROMUCOSAL

## 2022-09-21 MED ORDER — MIRABEGRON ER 25 MG PO TB24
50.0000 mg | ORAL_TABLET | Freq: Every day | ORAL | Status: DC
  Administered 2022-09-21 – 2022-10-02 (×12): 50 mg via ORAL
  Filled 2022-09-21 (×12): qty 2

## 2022-09-21 MED ORDER — FUROSEMIDE 10 MG/ML IJ SOLN: 20.0000 mg | Freq: Two times a day (BID) | INTRAMUSCULAR | Status: DC

## 2022-09-21 MED ORDER — CHLORHEXIDINE GLUCONATE CLOTH 2 % EX PADS
6.0000 | MEDICATED_PAD | Freq: Every day | CUTANEOUS | Status: DC
  Administered 2022-09-21 – 2022-09-29 (×8): 6 via TOPICAL

## 2022-09-21 MED ORDER — IPRATROPIUM-ALBUTEROL 0.5-2.5 (3) MG/3ML IN SOLN
RESPIRATORY_TRACT | Status: AC
  Administered 2022-09-21: 3 mL via RESPIRATORY_TRACT
  Filled 2022-09-21: qty 3

## 2022-09-21 MED ORDER — VENLAFAXINE HCL ER 150 MG PO CP24
150.0000 mg | ORAL_CAPSULE | Freq: Every day | ORAL | Status: DC
  Administered 2022-09-21 – 2022-10-02 (×12): 150 mg via ORAL
  Filled 2022-09-21: qty 1
  Filled 2022-09-21: qty 2
  Filled 2022-09-21 (×2): qty 1
  Filled 2022-09-21: qty 2
  Filled 2022-09-21: qty 1
  Filled 2022-09-21: qty 2
  Filled 2022-09-21 (×2): qty 1
  Filled 2022-09-21: qty 2
  Filled 2022-09-21 (×2): qty 1

## 2022-09-21 MED ORDER — IPRATROPIUM-ALBUTEROL 0.5-2.5 (3) MG/3ML IN SOLN: 3.0000 mL | Freq: Once | RESPIRATORY_TRACT | Status: AC

## 2022-09-21 MED ORDER — RANOLAZINE ER 500 MG PO TB12: 1000.0000 mg | ORAL_TABLET | Freq: Two times a day (BID) | ORAL | Status: DC

## 2022-09-21 MED ORDER — METOCLOPRAMIDE HCL 5 MG PO TABS
5.0000 mg | ORAL_TABLET | Freq: Two times a day (BID) | ORAL | Status: DC
  Administered 2022-09-21 – 2022-10-02 (×22): 5 mg via ORAL
  Filled 2022-09-21: qty 0.5
  Filled 2022-09-21: qty 1
  Filled 2022-09-21: qty 0.5
  Filled 2022-09-21 (×9): qty 1
  Filled 2022-09-21: qty 0.5
  Filled 2022-09-21: qty 1
  Filled 2022-09-21: qty 0.5
  Filled 2022-09-21: qty 1
  Filled 2022-09-21: qty 0.5
  Filled 2022-09-21 (×10): qty 1

## 2022-09-21 MED ORDER — ACETAMINOPHEN 650 MG RE SUPP: 650.0000 mg | Freq: Four times a day (QID) | RECTAL | Status: DC | PRN

## 2022-09-21 MED ORDER — LOSARTAN POTASSIUM 25 MG PO TABS
25.0000 mg | ORAL_TABLET | Freq: Every day | ORAL | Status: DC
  Administered 2022-09-21 – 2022-09-22 (×2): 25 mg via ORAL
  Filled 2022-09-21 (×3): qty 1

## 2022-09-21 MED ORDER — PREDNISONE 5 MG PO TABS: 5.0000 mg | ORAL_TABLET | Freq: Every day | ORAL | Status: DC

## 2022-09-21 MED ORDER — ATORVASTATIN CALCIUM 10 MG PO TABS
10.0000 mg | ORAL_TABLET | Freq: Every day | ORAL | Status: DC
  Administered 2022-09-21 – 2022-10-01 (×11): 10 mg via ORAL
  Filled 2022-09-21 (×12): qty 1

## 2022-09-21 MED ORDER — RANOLAZINE ER 500 MG PO TB12
1000.0000 mg | ORAL_TABLET | Freq: Two times a day (BID) | ORAL | Status: DC
  Administered 2022-09-21 – 2022-09-22 (×3): 1000 mg via ORAL
  Filled 2022-09-21 (×4): qty 2

## 2022-09-21 MED ORDER — VITAMIN B-12 1000 MCG PO TABS
1000.0000 ug | ORAL_TABLET | Freq: Every day | ORAL | Status: DC
  Administered 2022-09-21 – 2022-10-02 (×12): 1000 ug via ORAL
  Filled 2022-09-21 (×12): qty 1

## 2022-09-21 MED ORDER — METOPROLOL SUCCINATE ER 25 MG PO TB24
25.0000 mg | ORAL_TABLET | Freq: Every day | ORAL | Status: DC
  Administered 2022-09-21 – 2022-10-02 (×10): 25 mg via ORAL
  Filled 2022-09-21 (×12): qty 1

## 2022-09-21 MED ORDER — NITROGLYCERIN 2 % TD OINT: 0.5000 [in_us] | TOPICAL_OINTMENT | Freq: Once | TRANSDERMAL | Status: DC

## 2022-09-21 MED ORDER — PANTOPRAZOLE SODIUM 40 MG PO TBEC
40.0000 mg | DELAYED_RELEASE_TABLET | Freq: Two times a day (BID) | ORAL | Status: DC
  Administered 2022-09-21 – 2022-10-02 (×22): 40 mg via ORAL
  Filled 2022-09-21 (×22): qty 1

## 2022-09-21 MED ORDER — LORAZEPAM 0.5 MG PO TABS
0.5000 mg | ORAL_TABLET | Freq: Every evening | ORAL | Status: AC | PRN
  Administered 2022-09-21: 0.5 mg via ORAL
  Filled 2022-09-21: qty 1

## 2022-09-21 MED ORDER — ACETAMINOPHEN 325 MG PO TABS
650.0000 mg | ORAL_TABLET | Freq: Four times a day (QID) | ORAL | Status: DC | PRN
  Administered 2022-09-23 – 2022-10-01 (×2): 650 mg via ORAL
  Filled 2022-09-21 (×2): qty 2

## 2022-09-21 MED ORDER — LEVOTHYROXINE SODIUM 50 MCG PO TABS
75.0000 ug | ORAL_TABLET | Freq: Every day | ORAL | Status: DC
  Administered 2022-09-22 – 2022-10-02 (×11): 75 ug via ORAL
  Filled 2022-09-21 (×11): qty 1

## 2022-09-21 MED ORDER — MAGNESIUM SULFATE 2 GM/50ML IV SOLN
2.0000 g | Freq: Once | INTRAVENOUS | Status: AC
  Administered 2022-09-21: 2 g via INTRAVENOUS
  Filled 2022-09-21: qty 50

## 2022-09-21 MED ORDER — CLOPIDOGREL BISULFATE 75 MG PO TABS
75.0000 mg | ORAL_TABLET | Freq: Every day | ORAL | Status: DC
  Administered 2022-09-21 – 2022-10-02 (×12): 75 mg via ORAL
  Filled 2022-09-21 (×12): qty 1

## 2022-09-21 MED ORDER — ORAL CARE MOUTH RINSE: 15.0000 mL | OROMUCOSAL | Status: DC | PRN

## 2022-09-21 MED ORDER — NITROGLYCERIN 0.4 MG SL SUBL: 0.4000 mg | SUBLINGUAL_TABLET | SUBLINGUAL | Status: DC | PRN

## 2022-09-21 MED ORDER — ISOSORBIDE MONONITRATE ER 60 MG PO TB24
120.0000 mg | ORAL_TABLET | Freq: Every day | ORAL | Status: DC
  Administered 2022-09-21 – 2022-09-22 (×2): 120 mg via ORAL
  Filled 2022-09-21 (×2): qty 2

## 2022-09-21 MED ORDER — FUROSEMIDE 10 MG/ML IJ SOLN
40.0000 mg | Freq: Once | INTRAMUSCULAR | Status: AC
  Administered 2022-09-21: 40 mg via INTRAVENOUS
  Filled 2022-09-21: qty 4

## 2022-09-21 MED ORDER — IPRATROPIUM-ALBUTEROL 0.5-2.5 (3) MG/3ML IN SOLN
3.0000 mL | RESPIRATORY_TRACT | Status: DC | PRN
  Administered 2022-09-21 (×2): 3 mL via RESPIRATORY_TRACT
  Filled 2022-09-21 (×3): qty 3

## 2022-09-21 NOTE — Progress Notes (Signed)
   09/21/22 1117  BiPAP/CPAP/SIPAP  BiPAP/CPAP/SIPAP Pt Type Adult  BiPAP/CPAP/SIPAP V60  Mask Type Full face mask  Mask Size Small  Set Rate 14 breaths/min  Respiratory Rate 24 breaths/min  IPAP 10 cmH20  EPAP 5 cmH2O  FiO2 (%) 28 %  Minute Ventilation 11.6  Leak 12  Peak Inspiratory Pressure (PIP) 11  Tidal Volume (Vt) 400  Patient Home Equipment No  Auto Titrate No  Nasal massage performed Yes  BiPAP/CPAP /SiPAP Vitals  Pulse Rate 90  Resp (!) 24  BP 123/60  SpO2 97 %  Bilateral Breath Sounds Diminished;Expiratory wheezes (exp whz better- mild)  MEWS Score/Color  MEWS Score 1  MEWS Score Color Green

## 2022-09-21 NOTE — Progress Notes (Signed)
  Carryover admission to the Day Admitter.  I discussed this case with the EDP, Dr. Nicanor Alcon.  Per these discussions:   This is a 82 year old female with history of chronic diastolic heart failure, chronic B12 deficiency anemia with baseline hemoglobin of approximately 10, who is being admitted with acute on chronic diastolic heart failure complicated by acute hypoxic respiratory distress after presenting with shortness of breath associated with mild wheezing, and worsening of edema in the bilateral lower extremities, with laboratory findings include elevated BNP as well as chest x-ray suggestive of pulmonary edema.  Initial oxygen saturations were in the low to mid 80s on room air, [place] improving into the range of 94 to 96% on 2 L.  She was subsequently started on BiPAP in the setting of increased work of breathing and evidence of pulmonary edema.  She has received dose of IV Lasix.  No chest pain.  She has a mildly elevated troponin, which is felt to be on the basis of supply/demand mismatch stemming from her acutely decompensated heart failure itself as well as contribution from diminished oxygen delivery capacity resulting from acute hypoxic respiratory distress.  EKG without any evidence of acute ischemic change.  I have placed an order for inpatient admission to the stepdown unit for further evaluation management of the above.  I have placed some additional preliminary admit orders via the adult multi-morbid admission order set. I have also ordered an add-on serum magnesium level.  Will defer additional orders for IV diuresis to the admitting hospitalist.  I have also ordered CODE STATUS as DNR given CODE STATUS documentation from most recent 2 prior hospitalizations as well as active MOST  form reflecting wishes for DNR.       Newton Pigg, DO Hospitalist

## 2022-09-21 NOTE — ED Provider Notes (Signed)
EMERGENCY DEPARTMENT AT [facility] Provider Note   CSN: [id number] Arrival date & time: 09/21/22  0349     History  Chief Complaint  Patient presents with   Shortness of Breath    Erin Ingram is a 82 y.o. female.  The history is provided by the patient and the EMS personnel. The history is limited by the condition of the patient (level 5 caveat memory issues).  Shortness of Breath Severity:  Severe Onset quality:  Sudden Duration: hours. Timing:  Constant Progression:  Unchanged Chronicity:  New Context: not URI   Relieved by:  Nothing Worsened by:  Nothing Associated symptoms: wheezing   Associated symptoms: no fever   Risk factors: no hx of PE/DVT and no recent surgery        Home Medications Prior to Admission medications   Medication Sig Start Date End Date Taking? Authorizing Provider  acetaminophen (TYLENOL) 500 MG tablet Take 1,000 mg by mouth 2 (two) times daily as needed.    [provider]  alum & mag hydroxide-simeth (MAALOX PLUS) 400-400-40 MG/5ML suspension Take 10 mLs by mouth as needed for indigestion.    [provider]  atorvastatin (LIPITOR) 10 MG tablet TAKE 1 TABLET ONCE DAILY. 12/12/18   Corky Crafts, MD  Calcium-Phosphorus-Vitamin D (CALCIUM/VITAMIN D3/ADULT GUMMY) 250-100-500 MG-MG-UNIT CHEW Chew 1 Piece of gum by mouth 2 (two) times daily. 05/27/22   Medina-Vargas, Monina C, NP  Cholecalciferol (VITAMIN D) 50 MCG (2000 UT) tablet Take 2,000 Units by mouth daily.    [provider]  clopidogrel (PLAVIX) 75 MG tablet TAKE 1 TABLET ONCE DAILY. 06/16/18   Corky Crafts, MD  COENZYME Q-10 PO Take 10 mg by mouth at bedtime.    [provider]  cyanocobalamin 1000 MCG tablet Take 1,000 mcg by mouth daily.    [provider]  fluticasone (FLONASE) 50 MCG/ACT nasal spray Place 2 sprays into both nostrils daily as needed for allergies.     [provider]   furosemide (LASIX) 20 MG tablet Take 20 mg by mouth daily.    [provider]  isosorbide mononitrate (IMDUR) 60 MG 24 hr tablet Take 2 tablets (120 mg total) by mouth daily. 03/21/18   Corky Crafts, MD  ketoconazole (NIZORAL) 2 % shampoo Apply 1 Application topically 2 (two) times a week. Tuesday and Friday    [provider]  levocetirizine (XYZAL) 5 MG tablet TAKE 1 TABLET ONCE DAILY IN THE EVENING. 09/12/18   Mast, Man X, NP  levothyroxine (SYNTHROID) 75 MCG tablet TAKE 1 TABLET ONCE DAILY ON EMPTY STOMACH. 09/12/18   Mast, Man X, NP  losartan (COZAAR) 25 MG tablet Take 25 mg by mouth daily.    [provider]  melatonin 1 MG TABS tablet Take 1 mg by mouth at bedtime.    [provider]  memantine (NAMENDA) 10 MG tablet Take 10 mg by mouth 2 (two) times daily.    [provider]  Menthol, Topical Analgesic, (BIOFREEZE) 10 % LIQD Apply topically as needed.    [provider]  methocarbamol (ROBAXIN) 750 MG tablet Take 750 mg by mouth every 6 (six) hours as needed for muscle spasms.    [provider]  metoCLOPramide (REGLAN) 5 MG tablet Take 5 mg by mouth in the morning and at bedtime.    [provider]  metoprolol succinate (TOPROL-XL) 25 MG 24 hr tablet Take 25 mg by mouth daily. Take with or  immediately following a meal.    [provider]  miconazole (MICOTIN) 2 % powder Apply topically 2 (two) times daily.  Apply to groin and pannus topically    [provider]  MYRBETRIQ 50 MG TB24 tablet TAKE 1 TABLET BY MOUTH DAILY. 12/12/18   Mast, Man X, NP  nitroGLYCERIN (NITROSTAT) 0.4 MG SL tablet Place 0.4 mg under the tongue every 5 (five) minutes as needed for chest pain. X 3 doses 07/19/18   [provider]  pantoprazole (PROTONIX) 40 MG tablet Take 1 tablet (40 mg total) by mouth 2 (two) times daily. 08/05/22   Medina-Vargas, Monina C, NP  polyethylene glycol (MIRALAX / GLYCOLAX) 17 g packet  Take 17 g by mouth daily.    [provider]  potassium chloride (MICRO-K) 10 MEQ CR capsule Take 20 mEq by mouth 2 (two) times daily.    [provider]  predniSONE (DELTASONE) 5 MG tablet Take 5 mg by mouth daily. 07/21/22   [provider]  PSYLLIUM HUSK PO Take 0.4 g by mouth daily. May keep at bedside    [provider]  ranolazine (RANEXA) 1000 MG SR tablet TAKE 1 TABLET BY MOUTH TWICE DAILY. 12/12/18   Corky Crafts, MD  venlafaxine (EFFEXOR) 75 MG tablet Take 150 mg by mouth. 2 caps= 150 mg; oral  Once A Day    [provider]  zinc oxide 20 % ointment Apply 1 application topically as needed for irritation.    [provider]      Allergies    Nsaids, Butorphanol, Sulfa antibiotics, Bisoprolol fumarate, Butorphanol tartrate, Codeine, Demerol [meperidine], Imipramine, Meperidine and related, Statins, Tequin [gatifloxacin], Brilinta [ticagrelor], Pseudoephedrine hcl, Septra [sulfamethoxazole-trimethoprim], and Sulfamethoxazole-trimethoprim    Review of Systems   Review of Systems  Reason unable to perform ROS: memory issues.  Constitutional:  Negative for fever.  Respiratory:  Positive for shortness of breath and wheezing.   Cardiovascular:  Positive for leg swelling.    Physical Exam Updated Vital Signs BP 136/65   Pulse 84   Temp 97.7 F (36.5 C) (Oral)   Resp (!) 21   Wt 91.7 kg   SpO2 98%   BMI 42.26 kg/m  Physical Exam Vitals and nursing note reviewed.  Constitutional:      General: She is not in acute distress.    Appearance: Normal appearance. She is well-developed. She is not diaphoretic.  HENT:     Head: Normocephalic and atraumatic.     Nose: Nose normal.  Eyes:     Pupils: Pupils are equal, round, and reactive to light.  Cardiovascular:     Rate and Rhythm: Normal rate and regular rhythm.     Pulses: Normal pulses.     Heart sounds: Normal heart sounds.  Pulmonary:     Effort: Respiratory  distress present.     Breath sounds: Wheezing present.  Abdominal:     General: Bowel sounds are normal. There is no distension.     Palpations: Abdomen is soft.     Tenderness: There is no abdominal tenderness. There is no guarding or rebound.  Genitourinary:    Vagina: No vaginal discharge.  Musculoskeletal:        General: Normal range of motion.     Cervical back: Neck supple.     Right lower leg: Edema present.     Left lower leg: Edema present.  Skin:    General: Skin is warm and dry.     Capillary Refill:  Capillary refill takes less than 2 seconds.     Findings: No erythema or rash.  Neurological:     Mental Status: She is alert.     Deep Tendon Reflexes: Reflexes normal.  Psychiatric:        Mood and Affect: Mood normal.     ED Results / Procedures / Treatments   Labs (all labs ordered are listed, but only abnormal results are displayed) Results for orders placed or performed during the hospital encounter of 09/21/22  Resp panel by RT-PCR (RSV, Flu A&B, Covid) Anterior Nasal Swab   Specimen: Anterior Nasal Swab  Result Value Ref Range   SARS Coronavirus 2 by RT PCR NEGATIVE NEGATIVE   Influenza A by PCR NEGATIVE NEGATIVE   Influenza B by PCR NEGATIVE NEGATIVE   Resp Syncytial Virus by PCR NEGATIVE NEGATIVE  CBC with Differential  Result Value Ref Range   WBC 16.0 (H) 4.0 - 10.5 K/uL   RBC 2.77 (L) 3.87 - 5.11 MIL/uL   Hemoglobin 9.0 (L) 12.0 - 15.0 g/dL   HCT 19.1 (L) 47.8 - 29.5 %   MCV 105.1 (H) 80.0 - 100.0 fL   MCH 32.5 26.0 - 34.0 pg   MCHC 30.9 30.0 - 36.0 g/dL   RDW 62.1 30.8 - 65.7 %   Platelets 310 150 - 400 K/uL   nRBC 0.1 0.0 - 0.2 %   Neutrophils Relative % 80 %   Neutro Abs 12.9 (H) 1.7 - 7.7 K/uL   Lymphocytes Relative 11 %   Lymphs Abs 1.7 0.7 - 4.0 K/uL   Monocytes Relative 7 %   Monocytes Absolute 1.2 (H) 0.1 - 1.0 K/uL   Eosinophils Relative 1 %   Eosinophils Absolute 0.1 0.0 - 0.5 K/uL   Basophils Relative 0 %   Basophils Absolute  0.0 0.0 - 0.1 K/uL   Immature Granulocytes 1 %   Abs Immature Granulocytes 0.10 (H) 0.00 - 0.07 K/uL  Brain natriuretic peptide  Result Value Ref Range   B Natriuretic Peptide 1,723.8 (H) 0.0 - 100.0 pg/mL  Blood gas, venous (at [facility] and [facility])  Result Value Ref Range   pH, Ven 7.39 7.25 - 7.43   pCO2, Ven 42 (L) 44 - 60 mmHg   pO2, Ven 31 (LL) 32 - 45 mmHg   Bicarbonate 25.4 20.0 - 28.0 mmol/L   Acid-Base Excess 0.3 0.0 - 2.0 mmol/L   O2 Saturation 46.3 %   Patient temperature 37.0   Comprehensive metabolic panel  Result Value Ref Range   Sodium 139 135 - 145 mmol/L   Potassium 3.9 3.5 - 5.1 mmol/L   Chloride 107 98 - 111 mmol/L   CO2 20 (L) 22 - 32 mmol/L   Glucose, Bld 122 (H) 70 - 99 mg/dL   BUN 30 (H) 8 - 23 mg/dL   Creatinine, Ser 8.46 (H) 0.44 - 1.00 mg/dL   Calcium 7.8 (L) 8.9 - 10.3 mg/dL   Total Protein 6.1 (L) 6.5 - 8.1 g/dL   Albumin 3.0 (L) 3.5 - 5.0 g/dL   AST 15 15 - 41 U/L   ALT 13 0 - 44 U/L   Alkaline Phosphatase 37 (L) 38 - 126 U/L   Total Bilirubin 0.5 0.3 - 1.2 mg/dL   GFR, Estimated 39 (L) >60 mL/min   Anion gap 12 5 - 15  Troponin I (High Sensitivity)  Result Value Ref Range   Troponin I (High Sensitivity) 278 (HH) <18 ng/L   DG Chest Portable 1  View  Result Date: 09/21/2022 CLINICAL DATA:  Dyspnea and shortness of breath. 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 Thoracic aorta is heavily calcified with stable mediastinal borders. There is increased central vascular engorgement and moderate perihilar interstitial and ground-glass edema. Interstitial edema continues into the bases where there are small pleural effusions forming, as well as patchy opacities which may represent additional edema or superimposed pneumonia. There are degenerative changes and slight dextroscoliosis thoracic spine. IMPRESSION: 1. CHF or fluid overload with perihilar interstitial and ground-glass edema. 2.  Developing small pleural effusions. 3. Patchy opacities in the bases which may be due to additional edema or superimposed pneumonia. Electronically Signed   By: Almira Bar M.D.   On: 09/21/2022 05:53    EKG Interpretation Date/Time:  Monday September 21 2022 03:56:49 EDT Ventricular Rate:  103 PR Interval:  202 QRS Duration:  87 QT Interval:  349 QTC Calculation: 457 R Axis:   -52  Text Interpretation: Sinus tachycardia Abnormal R-wave progression, late transition Repol abnrm suggests ischemia, lateral leads Baseline wander in lead(s) V5 V6 Confirmed by Nicanor Alcon, Kilea Mccarey ([id number]) on 09/21/2022 3:59:08 AM  Radiology DG Chest Portable 1 View  Result Date: 09/21/2022 CLINICAL DATA:  Dyspnea and shortness of breath. 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 Thoracic aorta is heavily calcified with stable mediastinal borders. There is increased central vascular engorgement and moderate perihilar interstitial and ground-glass edema. Interstitial edema continues into the bases where there are small pleural effusions forming, as well as patchy opacities which may represent additional edema or superimposed pneumonia. There are degenerative changes and slight dextroscoliosis thoracic spine. IMPRESSION: 1. CHF or fluid overload with perihilar interstitial and ground-glass edema. 2. Developing small pleural effusions. 3. Patchy opacities in the bases which may be due to additional edema or superimposed pneumonia. Electronically Signed   By: Almira Bar M.D.   On: 09/21/2022 05:53    Procedures Procedures    Medications Ordered in ED Medications  acetaminophen (TYLENOL) tablet 650 mg (has no administration in time range)    Or  acetaminophen (TYLENOL) suppository 650 mg (has no administration in time range)  ipratropium-albuterol (DUONEB) 0.5-2.5 (3) MG/3ML nebulizer solution 3 mL (3 mLs Nebulization Given 09/21/22 0424)  furosemide  (LASIX) injection 40 mg (40 mg Intravenous Given 09/21/22 0459)    ED Course/ Medical Decision Making/ A&P                             Medical Decision Making Patient with a h/o CHF with SOB this evening at [facility] home   Amount and/or Complexity of Data Reviewed Independent Historian: EMS    Details: See above  External Data Reviewed: notes.    Details: Previous notes reviewed  Labs: ordered.    Details: Elevated troponin 278, negative covid and flu elevated BNP 1723.8, elevated white count 16, hemoglobin low 9, normal platelet count.  Normal sodium 139, normal potassium 3.9, elevated creatinine 1.35, normal LFTs  Radiology: ordered and independent interpretation performed.    Details: Pulmonary edema by me on CXR ECG/medicine tests: ordered and independent interpretation performed. Decision-making details documented in ED Course.  Risk Prescription drug management. Decision regarding hospitalization. Risk Details: Pulmonary edem, lasix initiated along with the BIPAP and is breathing easier.  Will need Admission and troponins cycled   Critical Care  Total time providing critical care: 30 minutes (Bipap, bedside care and potential for serious outcomes )    Final Clinical Impression(s) / ED Diagnoses Final diagnoses:  Acute pulmonary edema (HCC)  Elevated troponin I level  Anemia, unspecified type   The patient appears reasonably stabilized for admission considering the current resources, flow, and capabilities available in the ED at this time, and I doubt any other [facility] requiring further screening and/or treatment in the ED prior to admission.  Rx / DC Orders ED Discharge Orders     None         Valynn Schamberger, MD 09/21/22 [phone]

## 2022-09-21 NOTE — ED Notes (Signed)
ED TO INPATIENT HANDOFF REPORT  ED Nurse Name and Phone #: Thamas Jaegers Name/Age/Gender Erin Ingram 82 y.o. female Room/Bed: WA21/WA21  Code Status   Code Status: DNR  Home/SNF/Other Skilled nursing facility; [facility] Patient oriented to: self, place, time, and situation Is this baseline? Yes   Triage Complete: Triage complete  Chief Complaint Acute on chronic diastolic heart failure (HCC) [I50.33]  Triage Note [facility] with [facility] of ongoing SHOB. Pt reports increased SHOB tonight. Pt dyspneic in conversation and increased work of breathing and has audible wheezing. Pt alert and oriented. Pt initially hypoxic at 86% on RA. Reports hx of CHF. Pt was given 1 duo neb with EMS without improvement.    Allergies Allergies  Allergen Reactions   Nsaids Other (See Comments)    Due to chronic kidney failure   Butorphanol Other (See Comments)    agitation Constipation Produced a lot of urine, made patient feel crazy   Sulfa Antibiotics Rash   Bisoprolol Fumarate Other (See Comments)    Doesn't remember   Butorphanol Tartrate Other (See Comments)    Produced a lot of urine, made patient feel crazy   Codeine Nausea And Vomiting   Demerol [Meperidine] Nausea And Vomiting   Imipramine     Sweating, facial dysfunction    Meperidine And Related Nausea And Vomiting   Statins     MYALGIAS   Tequin [Gatifloxacin] Other (See Comments)    Caused hypoglycemia Low blood sugar   Brilinta [Ticagrelor] Rash    CAUSES PETECHIAE, PURPURA   Pseudoephedrine Hcl Palpitations   Septra [Sulfamethoxazole-Trimethoprim] Rash   Sulfamethoxazole-Trimethoprim Rash    Level of Care/Admitting Diagnosis ED Disposition     ED Disposition  Admit   Condition  --   Comment  Hospital Area: [facility] [[id number]]  Level of Care: Stepdown [14]  Admit to SDU based on following criteria: Severe physiological/psychological symptoms:  Any diagnosis  requiring assessment & intervention at least every 4 hours on an ongoing basis to obtain desired patient outcomes including stability and rehabilitation  May admit patient to [facility] or [facility] if equivalent level of care is available:: No  Covid Evaluation: Asymptomatic - no recent exposure (last 10 days) testing not required  Diagnosis: Acute on chronic diastolic heart failure (HCC) [428.33.ICD-9-CM]  Admitting Physician: Angie Fava [[id number]]  Attending Physician: Angie Fava [[id number]]  Certification:: I certify this patient will need inpatient services for at least 2 midnights  Estimated Length of Stay: 2          B Medical/Surgery History Past Medical History:  Diagnosis Date   Anemia    Arthritis    "fingers, back, shoulders, hips" (04/10/2016)   Chronic diastolic CHF (congestive heart failure) (HCC)    a. normal EF, LVEDP at [facility] in 6/17 33 >> Lasix started    CKD (chronic kidney disease), stage III (HCC)    Coronary artery disease    a. s/p CABG 2000 (SVG/ free LIMA Y graft to the diagonal and distal LAD, SVG to the OM 1, and SVG to the PDA) . b. Cath 12/19/2014 90% dSVG to RCA s/p 2 overlapping DES. c. LHC 2016, 2017 no intervention except diuresis needed. d. Low risk nuc 03/2016. e. Cath 12/2017 patent LIMA-LAD, SVG-OM, SVG-PDA but occluded SVG to diag.    Depression    with anxious component   GERD (gastroesophageal reflux disease)    History of hiatal hernia  Hyperlipidemia    Hypothyroidism    Obesity    OSA on CPAP    PMR (polymyalgia rheumatica) (HCC)    Pneumonia    "2-3 times" (04/10/2016)   Stable angina    microvascular, improved with Ranexa   Subclavian artery stenosis (HCC)    a. L by cath note in 2016.   TIA (transient ischemic attack)    Past Surgical History:  Procedure Laterality Date   BREAST BIOPSY Right 1964   CARDIAC CATHETERIZATION  08   patent grafts, no culprit lesions, EF 65%   CARDIAC CATHETERIZATION N/A 12/19/2014    Procedure: Left Heart Cath and Cors/Grafts Angiography;  Surgeon: Corky Crafts, MD;  Location[facility] INVASIVE CV LAB;  Service: Cardiovascular;  Laterality: N/A;   CARDIAC CATHETERIZATION N/A 12/19/2014   Procedure: Coronary Stent Intervention;  Surgeon: Corky Crafts, MD;  Location: [facility] INVASIVE CV LAB;  Service: Cardiovascular;  Laterality: N/A;   CARDIAC CATHETERIZATION N/A 01/01/2015   Procedure: Left Heart Cath and Cors/Grafts Angiography;  Surgeon: Corky Crafts, MD;  Location: [facility] INVASIVE CV LAB;  Service: Cardiovascular;  Laterality: N/A;   CARDIAC CATHETERIZATION N/A 09/20/2015   Procedure: Left Heart Cath and Cors/Grafts Angiography;  Surgeon: Corky Crafts, MD;  Location: [facility] INVASIVE CV LAB;  Service: Cardiovascular;  Laterality: N/A;   CATARACT EXTRACTION W/ INTRAOCULAR LENS  IMPLANT, BILATERAL Bilateral 2011   COLONOSCOPY WITH PROPOFOL N/A 07/27/2016   Procedure: COLONOSCOPY WITH PROPOFOL;  Surgeon: Charlott Rakes, MD;  Location: [facility] ENDOSCOPY;  Service: Endoscopy;  Laterality: N/A;   CORONARY ARTERY BYPASS GRAFT  2000   ASCVD, multivessel, S./P.   DILATION AND CURETTAGE OF UTERUS  1980s   ESOPHAGOGASTRODUODENOSCOPY (EGD) WITH PROPOFOL N/A 07/27/2016   Procedure: ESOPHAGOGASTRODUODENOSCOPY (EGD) WITH PROPOFOL;  Surgeon: Charlott Rakes, MD;  Location: [facility] ENDOSCOPY;  Service: Endoscopy;  Laterality: N/A;   FRACTURE SURGERY     LEFT HEART CATH AND CORS/GRAFTS ANGIOGRAPHY N/A 01/12/2018   Procedure: LEFT HEART CATH AND CORS/GRAFTS ANGIOGRAPHY;  Surgeon: Corky Crafts, MD;  Location: [facility] INVASIVE CV LAB;  Service: Cardiovascular;  Laterality: N/A;   PATELLA FRACTURE SURGERY Left 1993     A IV Location/Drains/Wounds Patient Lines/Drains/Airways Status     Active Line/Drains/Airways     Name Placement date Placement time Site Days   Peripheral IV 09/21/22 18 G Left Antecubital 09/21/22  0356  Antecubital  less than 1            Intake/Output Last 24  hours  Intake/Output Summary (Last 24 hours) at 09/21/2022 1122 Last data filed at 09/21/2022 1119 Gross per 24 hour  Intake --  Output 2100 ml  Net -2100 ml    Labs/Imaging Results for orders placed or performed during the hospital encounter of 09/21/22 (from the past 48 hour(s))  CBC with Differential     Status: Abnormal   Collection Time: 09/21/22  4:23 AM  Result Value Ref Range   WBC 16.0 (H) 4.0 - 10.5 K/uL   RBC 2.77 (L) 3.87 - 5.11 MIL/uL   Hemoglobin 9.0 (L) 12.0 - 15.0 g/dL   HCT 52.8 (L) 41.3 - 24.4 %   MCV 105.1 (H) 80.0 - 100.0 fL   MCH 32.5 26.0 - 34.0 pg   MCHC 30.9 30.0 - 36.0 g/dL   RDW 01.0 27.2 - 53.6 %   Platelets 310 150 - 400 K/uL   nRBC 0.1 0.0 - 0.2 %   Neutrophils Relative % 80 %   Neutro Abs 12.9 (  H) 1.7 - 7.7 K/uL   Lymphocytes Relative 11 %   Lymphs Abs 1.7 0.7 - 4.0 K/uL   Monocytes Relative 7 %   Monocytes Absolute 1.2 (H) 0.1 - 1.0 K/uL   Eosinophils Relative 1 %   Eosinophils Absolute 0.1 0.0 - 0.5 K/uL   Basophils Relative 0 %   Basophils Absolute 0.0 0.0 - 0.1 K/uL   Immature Granulocytes 1 %   Abs Immature Granulocytes 0.10 (H) 0.00 - 0.07 K/uL    Comment: Performed at [facility], [address], [place], [place] [address]  Brain natriuretic peptide     Status: Abnormal   Collection Time: 09/21/22  4:23 AM  Result Value Ref Range   B Natriuretic Peptide 1,723.8 (H) 0.0 - 100.0 pg/mL    Comment: Performed at [facility], [address], [place], [place] [address]  Resp panel by RT-PCR (RSV, Flu A&B, Covid) Anterior Nasal Swab     Status: None   Collection Time: 09/21/22  4:23 AM   Specimen: Anterior Nasal Swab  Result Value Ref Range   SARS Coronavirus 2 by RT PCR NEGATIVE NEGATIVE    Comment: (NOTE) SARS-CoV-2 target nucleic acids are NOT DETECTED.  The SARS-CoV-2 RNA is generally detectable in upper respiratory specimens during the acute phase of infection. The lowest concentration of  SARS-CoV-2 viral copies this assay can detect is 138 copies/mL. A negative result does not preclude SARS-Cov-2 infection and should not be used as the sole basis for treatment or other patient management decisions. A negative result may occur with  improper specimen collection/handling, submission of specimen other than nasopharyngeal swab, presence of viral mutation(s) within the areas targeted by this assay, and inadequate number of viral copies(<138 copies/mL). A negative result must be combined with clinical observations, patient history, and epidemiological information. The expected result is Negative.  Fact Sheet for Patients:  BloggerCourse.com  Fact Sheet for Healthcare Providers:  SeriousBroker.it  This test is no t yet approved or cleared by the [place] FDA and  has been authorized for detection and/or diagnosis of SARS-CoV-2 by FDA under an Emergency Use Authorization (EUA). This EUA will remain  in effect (meaning this test can be used) for the duration of the COVID-19 declaration under Section 564(b)(1) of the Act, 21 U.S.C.section 360bbb-3(b)(1), unless the authorization is terminated  or revoked sooner.       Influenza A by PCR NEGATIVE NEGATIVE   Influenza B by PCR NEGATIVE NEGATIVE    Comment: (NOTE) The Xpert Xpress SARS-CoV-2/FLU/RSV plus assay is intended as an aid in the diagnosis of influenza from Nasopharyngeal swab specimens and should not be used as a sole basis for treatment. Nasal washings and aspirates are unacceptable for Xpert Xpress SARS-CoV-2/FLU/RSV testing.  Fact Sheet for Patients: BloggerCourse.com  Fact Sheet for Healthcare Providers: SeriousBroker.it  This test is not yet approved or cleared by the [place] FDA and has been authorized for detection and/or diagnosis of SARS-CoV-2 by FDA under an Emergency Use Authorization (EUA).  This EUA will remain in effect (meaning this test can be used) for the duration of the COVID-19 declaration under Section 564(b)(1) of the Act, 21 U.S.C. section 360bbb-3(b)(1), unless the authorization is terminated or revoked.     Resp Syncytial Virus by PCR NEGATIVE NEGATIVE    Comment: (NOTE) Fact Sheet for Patients: BloggerCourse.com  Fact Sheet for Healthcare Providers: SeriousBroker.it  This test is not yet approved or cleared by the [place] FDA and has  been authorized for detection and/or diagnosis of SARS-CoV-2 by FDA under an Emergency Use Authorization (EUA). This EUA will remain in effect (meaning this test can be used) for the duration of the COVID-19 declaration under Section 564(b)(1) of the Act, 21 U.S.C. section 360bbb-3(b)(1), unless the authorization is terminated or revoked.  Performed at [facility], [address] [place], [place] [address]   Troponin I (High Sensitivity)     Status: Abnormal   Collection Time: 09/21/22  4:23 AM  Result Value Ref Range   Troponin I (High Sensitivity) 278 (HH) <18 ng/L    Comment: CRITICAL RESULT CALLED TO, READ BACK BY AND VERIFIED WITH FEVRIER,C ATV 0530 ON 09/21/22 BY LUZOLOP (NOTE) Elevated high sensitivity troponin I (hsTnI) values and significant  changes across serial measurements may suggest ACS but many other  chronic and acute conditions are known to elevate hsTnI results.  Refer to the "Links" section for chest pain algorithms and additional  guidance. Performed at [facility], [address] [place], [place] [address]   Comprehensive metabolic panel     Status: Abnormal   Collection Time: 09/21/22  4:23 AM  Result Value Ref Range   Sodium 139 135 - 145 mmol/L   Potassium 3.9 3.5 - 5.1 mmol/L   Chloride 107 98 - 111 mmol/L   CO2 20 (L) 22 - 32 mmol/L   Glucose, Bld 122 (H) 70 - 99 mg/dL    Comment: Glucose  reference range applies only to samples taken after fasting for at least 8 hours.   BUN 30 (H) 8 - 23 mg/dL   Creatinine, Ser 0.98 (H) 0.44 - 1.00 mg/dL   Calcium 7.8 (L) 8.9 - 10.3 mg/dL   Total Protein 6.1 (L) 6.5 - 8.1 g/dL   Albumin 3.0 (L) 3.5 - 5.0 g/dL   AST 15 15 - 41 U/L   ALT 13 0 - 44 U/L   Alkaline Phosphatase 37 (L) 38 - 126 U/L   Total Bilirubin 0.5 0.3 - 1.2 mg/dL   GFR, Estimated 39 (L) >60 mL/min    Comment: (NOTE) Calculated using the CKD-EPI Creatinine Equation (2021)    Anion gap 12 5 - 15    Comment: Performed at [facility], [address], [place], [place] [address]  Blood gas, venous (at [facility] and [facility])     Status: Abnormal   Collection Time: 09/21/22  4:48 AM  Result Value Ref Range   pH, Ven 7.39 7.25 - 7.43   pCO2, Ven 42 (L) 44 - 60 mmHg   pO2, Ven 31 (LL) 32 - 45 mmHg    Comment: CRITICAL RESULT CALLED TO, READ BACK BY AND VERIFIED WITH: LACIVITA,H AT 0508 ON 09/21/22 BY LUZOLOP    Bicarbonate 25.4 20.0 - 28.0 mmol/L   Acid-Base Excess 0.3 0.0 - 2.0 mmol/L   O2 Saturation 46.3 %   Patient temperature 37.0     Comment: Performed at [facility], [address] [place], [place] [address]  Troponin I (High Sensitivity)     Status: Abnormal   Collection Time: 09/21/22  6:27 AM  Result Value Ref Range   Troponin I (High Sensitivity) 270 (HH) <18 ng/L    Comment: CRITICAL VALUE NOTED. VALUE IS CONSISTENT WITH PREVIOUSLY REPORTED/CALLED VALUE (NOTE) Elevated high sensitivity troponin I (hsTnI) values and significant  changes across serial measurements may suggest ACS but many other  chronic and acute conditions are known to elevate hsTnI results.  Refer to the "Links" section for chest pain algorithms and additional  guidance. Performed at [facility], [address] [place], [place] [address]   Magnesium     Status: None   Collection Time: 09/21/22  6:27 AM  Result Value Ref Range   Magnesium  1.7 1.7 - 2.4 mg/dL    Comment: Performed at [facility], [address] [place], [place] [address]   DG Chest Portable 1 View  Result Date: 09/21/2022 CLINICAL DATA:  Dyspnea and shortness of breath. 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 Thoracic aorta is heavily calcified with stable mediastinal borders. There is increased central vascular engorgement and moderate perihilar interstitial and ground-glass edema. Interstitial edema continues into the bases where there are small pleural effusions forming, as well as patchy opacities which may represent additional edema or superimposed pneumonia. There are degenerative changes and slight dextroscoliosis thoracic spine. IMPRESSION: 1. CHF or fluid overload with perihilar interstitial and ground-glass edema. 2. Developing small pleural effusions. 3. Patchy opacities in the bases which may be due to additional edema or superimposed pneumonia. Electronically Signed   By: Almira Bar M.D.   On: 09/21/2022 05:53    Pending Labs Unresulted Labs (From admission, onward)     Start     Ordered   09/23/22 0500  Basic metabolic panel  Daily,   R     Comments: As Scheduled for 5 days    09/21/22 0842            Vitals/Pain Today's Vitals   09/21/22 0810 09/21/22 1030 09/21/22 1100 09/21/22 1117  BP:  (!) 140/61 123/60 123/60  Pulse:  95 89 90  Resp:  (!) 31 (!) 29 (!) 24  Temp: 98.4 F (36.9 C)     TempSrc: Oral     SpO2:  97% 97% 97%  Weight:      PainSc:        Isolation Precautions No active isolations  Medications Medications  acetaminophen (TYLENOL) tablet 650 mg (has no administration in time range)    Or  acetaminophen (TYLENOL) suppository 650 mg (has no administration in time range)  ipratropium-albuterol (DUONEB) 0.5-2.5 (3) MG/3ML nebulizer solution 3 mL (3 mLs Nebulization Given 09/21/22 0744)  metoprolol succinate (TOPROL-XL) 24  hr tablet 25 mg (has no administration in time range)  losartan (COZAAR) tablet 25 mg (has no administration in time range)  levothyroxine (SYNTHROID) tablet 75 mcg (has no administration in time range)  isosorbide mononitrate (IMDUR) 24 hr tablet 120 mg (has no administration in time range)  atorvastatin (LIPITOR) tablet 10 mg (has no administration in time range)  nitroGLYCERIN (NITROSTAT) SL tablet 0.4 mg (has no administration in time range)  clopidogrel (PLAVIX) tablet 75 mg (has no administration in time range)  memantine (NAMENDA) tablet 10 mg (has no administration in time range)  metoCLOPramide (REGLAN) tablet 5 mg (has no administration in time range)  mirabegron ER (MYRBETRIQ) tablet 50 mg (has no administration in time range)  pantoprazole (PROTONIX) EC tablet 40 mg (has no administration in time range)  predniSONE (DELTASONE) tablet 5 mg (has no administration in time range)  venlafaxine XR (EFFEXOR-XR) 24 hr capsule 150 mg (has no administration in time range)  furosemide (LASIX) injection 40 mg (has no administration in time range)  ipratropium-albuterol (DUONEB) 0.5-2.5 (3) MG/3ML nebulizer solution 3 mL (3 mLs Nebulization Given 09/21/22 0424)  furosemide (LASIX) injection 40  mg (40 mg Intravenous Given 09/21/22 0459)  potassium chloride SA (KLOR-CON M) CR tablet 40 mEq (40 mEq Oral Given 09/21/22 1024)  magnesium sulfate IVPB 2 g 50 mL (0 g Intravenous Stopped 09/21/22 0856)    Mobility walks with device     Focused Assessments     R Recommendations: See Admitting Provider Note  Report given to:   Additional Notes: Pt on BIPAP

## 2022-09-21 NOTE — Progress Notes (Signed)
   09/21/22 0435  BiPAP/CPAP/SIPAP  $ Non-Invasive Ventilator  Non-Invasive Vent Set Up;Non-Invasive Vent Initial  $ Face Mask Small Yes  BiPAP/CPAP/SIPAP Pt Type Adult  BiPAP/CPAP/SIPAP V60  Mask Type Full face mask  Mask Size Small  Set Rate 14 breaths/min  Respiratory Rate 32 breaths/min  IPAP 10 cmH20  EPAP 5 cmH2O  FiO2 (%) 28 %  Flow Rate 11.8 lpm  Minute Ventilation 11.8  Leak 16  Peak Inspiratory Pressure (PIP) 10  Tidal Volume (Vt) 378  Patient Home Equipment Yes  Auto Titrate No  Press High Alarm 30 cmH2O  Press Low Alarm 5 cmH2O  Nasal massage performed Yes  CPAP/SIPAP surface wiped down Yes  Oxygen Percent 28 %

## 2022-09-21 NOTE — Progress Notes (Signed)
   09/21/22 0435  BiPAP/CPAP/SIPAP  $ Non-Invasive Ventilator  Non-Invasive Vent Set Up;Non-Invasive Vent Initial  $ Face Mask Small Yes  BiPAP/CPAP/SIPAP Pt Type Adult  BiPAP/CPAP/SIPAP V60  Mask Type Full face mask  Mask Size Small  Set Rate 14 breaths/min  Respiratory Rate 32 breaths/min  IPAP 10 cmH20  EPAP 5 cmH2O  FiO2 (%) 28 %  Flow Rate 11.8 lpm  Minute Ventilation 11.8  Leak 16  Peak Inspiratory Pressure (PIP) 10  Tidal Volume (Vt) 378  Patient Home Equipment Yes  Auto Titrate No  Press High Alarm 30 cmH2O  Press Low Alarm 5 cmH2O  Nasal massage performed Yes  CPAP/SIPAP surface wiped down Yes  Oxygen Percent 28 %   Pt. Placed on BiPAP V60 per order, has worn one in the past, tolerating well.

## 2022-09-21 NOTE — Progress Notes (Signed)
Removed PT from BiPAP (to determine if able to utilize PRN) and placed on 2 LPM oxygen. Within 20-30 seconds PT became short of breath and Sp02 decreasing. RT placed PT back on BiPAP - Paramedic aware.

## 2022-09-21 NOTE — ED Notes (Signed)
Tried to take pt off of BPAP and placed on [place]. Pt stated that she was short of breathe and was more comfortable on BPAP vs. [place]. Pt was placed back on BPAP.

## 2022-09-21 NOTE — ED Notes (Signed)
Pt repositioned in bed and given sips of fluid

## 2022-09-21 NOTE — ED Notes (Signed)
Date and time results received: 09/21/22 5:08 AM  (use smartphrase ".now" to insert current time)  Test: PO2 Critical Value: 31   Name of Provider Notified: Palumbo  Orders Received? Or Actions Taken?: Orders Received - See Orders for details

## 2022-09-21 NOTE — Progress Notes (Signed)
   09/21/22 0740  BiPAP/CPAP/SIPAP  BiPAP/CPAP/SIPAP Pt Type Adult  BiPAP/CPAP/SIPAP V60  Mask Type Full face mask  Mask Size Small  Set Rate 14 breaths/min  Respiratory Rate 24 breaths/min  IPAP 10 cmH20  EPAP 5 cmH2O  FiO2 (%) 28 %  Minute Ventilation 15  Leak 4  Peak Inspiratory Pressure (PIP) 10  Tidal Volume (Vt) 507  Patient Home Equipment No  Auto Titrate No  Press High Alarm 30 cmH2O  Press Low Alarm 5 cmH2O  CPAP/SIPAP surface wiped down Yes  BiPAP/CPAP /SiPAP Vitals  Pulse Rate 94  Resp (!) 24  BP (!) 140/68  SpO2 97 %  Bilateral Breath Sounds Expiratory wheezes;Diminished

## 2022-09-21 NOTE — ED Triage Notes (Addendum)
[facility] SHOB. Pt reports increased SHOB tonight. Pt dyspneic in conversation and increased work of breathing and has audible wheezing. Pt alert and oriented. Pt initially hypoxic at 86% on RA. Reports hx of CHF. Pt was given 1 duo neb with EMS without improvement.

## 2022-09-21 NOTE — H&P (Signed)
History and Physical    Patient: Erin Ingram ZOX:[id number] DOB: 07/29/1940 DOA: 09/21/2022 DOS: the patient was seen and examined on 09/21/2022 PCP: Mast, Man X, NP  Patient coming from: ALF/ILF  Chief Complaint:  Chief Complaint  Patient presents with   Shortness of Breath   HPI: Erin Ingram is a 82 y.o. female with medical history significant of Microcytic anemia, osteoarthritis, stage III CKD, depression, GERD, hiatal hernia, hyperlipidemia, hypothyroidism, obesity, OSA on CPAP, polymyalgia rheumatica, history of multiple episodes of pneumonia, subclavian artery stenosis, TIA, CAD, CABG, chronic diastolic CHF who [facility] due to progressively worsening dyspnea associated with lower extremity edema, orthopnea, "wet cough" cough that she is unable to expectorate, wheezing and hypoxia 86%.  Her symptoms started about a week ago.  Not sure of her sodium intake.  No dietary indiscretions per patient.  She received a DuoNeb from EMS.  She denied fever, chills, rhinorrhea, sore throat or hemoptysis.  No chest pain, palpitations, diaphoresis.  No abdominal pain, nausea, emesis, diarrhea, constipation, melena or hematochezia.  No flank pain, dysuria, frequency or hematuria.  No polyuria, polydipsia, polyphagia or blurred vision.   ED course: Initial vital signs were temperature 97.7 F, pulse 112, respirations 25, BP 143/83 mmHg O2 sat 85% on room air.  The patient received furosemide 40 mg IVP and a DuoNeb.  Lab work: Her CBC is her white count 16.0, hemoglobin 9.0 g/dL with an MCV of 811.9 fL and platelets 310.  Troponin 270 and then 217 ng/L.  BNP 1723.8 pg/mL.  Venous blood gas showed a normal pH pCO2 of 42 and pO2 of 31 mmHg.  Coronavirus, influenza and RSV PCR was negative.  CMP showed a CO2 of 20 mmol/L with a normal anion gap, the rest of the electrolytes were normal after calcium correction.  Glucose 122, BUN 30 and creatinine 1.35 mg/dL.   LFTs with normal transaminases, total bilirubin, ALT 37 units/L, total protein 6.1 and albumin 3.0 g/dL.  Imaging: Portable 1 view chest radiograph showing cardiomegaly with CHANGES.  There is CHF or fluid overload with perihilar interstitial and groundglass edema.   Review of Systems: As mentioned in the history of present illness. All other systems reviewed and are negative.  Past Medical History:  Diagnosis Date   Anemia    Arthritis    "fingers, back, shoulders, hips" (04/10/2016)   Chronic diastolic CHF (congestive heart failure) (HCC)    a. normal EF, LVEDP at [facility] in 6/17 33 >> Lasix started    CKD (chronic kidney disease), stage III (HCC)    Coronary artery disease    a. s/p CABG 2000 (SVG/ free LIMA Y graft to the diagonal and distal LAD, SVG to the OM 1, and SVG to the PDA) . b. Cath 12/19/2014 90% dSVG to RCA s/p 2 overlapping DES. c. LHC 2016, 2017 no intervention except diuresis needed. d. Low risk nuc 03/2016. e. Cath 12/2017 patent LIMA-LAD, SVG-OM, SVG-PDA but occluded SVG to diag.    Depression    with anxious component   GERD (gastroesophageal reflux disease)    History of hiatal hernia    Hyperlipidemia    Hypothyroidism    Obesity    OSA on CPAP    PMR (polymyalgia rheumatica) (HCC)    Pneumonia    "2-3 times" (04/10/2016)   Stable angina    microvascular, improved with Ranexa   Subclavian artery stenosis (HCC)    a. L by cath note in  2016.   TIA (transient ischemic attack)    Past Surgical History:  Procedure Laterality Date   BREAST BIOPSY Right 1964   CARDIAC CATHETERIZATION  08   patent grafts, no culprit lesions, EF 65%   CARDIAC CATHETERIZATION N/A 12/19/2014   Procedure: Left Heart Cath and Cors/Grafts Angiography;  Surgeon: Corky Crafts, MD;  Location: [facility] INVASIVE CV LAB;  Service: Cardiovascular;  Laterality: N/A;   CARDIAC CATHETERIZATION N/A 12/19/2014   Procedure: Coronary Stent Intervention;  Surgeon: Corky Crafts, MD;  Location: [facility]  INVASIVE CV LAB;  Service: Cardiovascular;  Laterality: N/A;   CARDIAC CATHETERIZATION N/A 01/01/2015   Procedure: Left Heart Cath and Cors/Grafts Angiography;  Surgeon: Corky Crafts, MD;  Location: [facility] INVASIVE CV LAB;  Service: Cardiovascular;  Laterality: N/A;   CARDIAC CATHETERIZATION N/A 09/20/2015   Procedure: Left Heart Cath and Cors/Grafts Angiography;  Surgeon: Corky Crafts, MD;  Location: [facility] INVASIVE CV LAB;  Service: Cardiovascular;  Laterality: N/A;   CATARACT EXTRACTION W/ INTRAOCULAR LENS  IMPLANT, BILATERAL Bilateral 2011   COLONOSCOPY WITH PROPOFOL N/A 07/27/2016   Procedure: COLONOSCOPY WITH PROPOFOL;  Surgeon: Charlott Rakes, MD;  Location: [facility] ENDOSCOPY;  Service: Endoscopy;  Laterality: N/A;   CORONARY ARTERY BYPASS GRAFT  2000   ASCVD, multivessel, S./P.   DILATION AND CURETTAGE OF UTERUS  1980s   ESOPHAGOGASTRODUODENOSCOPY (EGD) WITH PROPOFOL N/A 07/27/2016   Procedure: ESOPHAGOGASTRODUODENOSCOPY (EGD) WITH PROPOFOL;  Surgeon: Charlott Rakes, MD;  Location: [facility] ENDOSCOPY;  Service: Endoscopy;  Laterality: N/A;   FRACTURE SURGERY     LEFT HEART CATH AND CORS/GRAFTS ANGIOGRAPHY N/A 01/12/2018   Procedure: LEFT HEART CATH AND CORS/GRAFTS ANGIOGRAPHY;  Surgeon: Corky Crafts, MD;  Location: [facility] INVASIVE CV LAB;  Service: Cardiovascular;  Laterality: N/A;   PATELLA FRACTURE SURGERY Left 1993   Social History:  reports that she quit smoking about 53 years ago. Her smoking use included cigarettes. She has a 3.50 pack-year smoking history. She has never used smokeless tobacco. She reports that she does not drink alcohol and does not use drugs.  Allergies  Allergen Reactions   Nsaids Other (See Comments)    Due to chronic kidney failure   Butorphanol Other (See Comments)    agitation Constipation Produced a lot of urine, made patient feel crazy   Sulfa Antibiotics Rash   Bisoprolol Fumarate Other (See Comments)    Doesn't remember   Butorphanol Tartrate  Other (See Comments)    Produced a lot of urine, made patient feel crazy   Codeine Nausea And Vomiting   Demerol [Meperidine] Nausea And Vomiting   Imipramine     Sweating, facial dysfunction    Meperidine And Related Nausea And Vomiting   Statins     MYALGIAS   Tequin [Gatifloxacin] Other (See Comments)    Caused hypoglycemia Low blood sugar   Brilinta [Ticagrelor] Rash    CAUSES PETECHIAE, PURPURA   Pseudoephedrine Hcl Palpitations   Septra [Sulfamethoxazole-Trimethoprim] Rash   Sulfamethoxazole-Trimethoprim Rash    Family History  Problem Relation Age of Onset   Heart disease Mother    Heart attack Mother    Heart disease Father    Heart attack Father    Diabetes Brother    Pulmonary embolism Brother    Stroke Paternal Grandmother    Hypertension Neg Hx     Prior to Admission medications   Medication Sig Start Date End Date Taking? Authorizing Provider  acetaminophen (TYLENOL) 500 MG tablet Take 1,000 mg by mouth 2 (two)  times daily as needed.    [provider]  alum & mag hydroxide-simeth (MAALOX PLUS) 400-400-40 MG/5ML suspension Take 10 mLs by mouth as needed for indigestion.    [provider]  atorvastatin (LIPITOR) 10 MG tablet TAKE 1 TABLET ONCE DAILY. 12/12/18   Corky Crafts, MD  Calcium-Phosphorus-Vitamin D (CALCIUM/VITAMIN D3/ADULT GUMMY) 250-100-500 MG-MG-UNIT CHEW Chew 1 Piece of gum by mouth 2 (two) times daily. 05/27/22   Medina-Vargas, Monina C, NP  Cholecalciferol (VITAMIN D) 50 MCG (2000 UT) tablet Take 2,000 Units by mouth daily.    [provider]  clopidogrel (PLAVIX) 75 MG tablet TAKE 1 TABLET ONCE DAILY. 06/16/18   Corky Crafts, MD  COENZYME Q-10 PO Take 10 mg by mouth at bedtime.    [provider]  cyanocobalamin 1000 MCG tablet Take 1,000 mcg by mouth daily.    [provider]  fluticasone (FLONASE) 50 MCG/ACT nasal spray Place 2 sprays into both nostrils daily as needed for allergies.      [provider]  furosemide (LASIX) 20 MG tablet Take 20 mg by mouth daily.    [provider]  isosorbide mononitrate (IMDUR) 60 MG 24 hr tablet Take 2 tablets (120 mg total) by mouth daily. 03/21/18   Corky Crafts, MD  ketoconazole (NIZORAL) 2 % shampoo Apply 1 Application topically 2 (two) times a week. Tuesday and Friday    [provider]  levocetirizine (XYZAL) 5 MG tablet TAKE 1 TABLET ONCE DAILY IN THE EVENING. 09/12/18   Mast, Man X, NP  levothyroxine (SYNTHROID) 75 MCG tablet TAKE 1 TABLET ONCE DAILY ON EMPTY STOMACH. 09/12/18   Mast, Man X, NP  losartan (COZAAR) 25 MG tablet Take 25 mg by mouth daily.    [provider]  melatonin 1 MG TABS tablet Take 1 mg by mouth at bedtime.    [provider]  memantine (NAMENDA) 10 MG tablet Take 10 mg by mouth 2 (two) times daily.    [provider]  Menthol, Topical Analgesic, (BIOFREEZE) 10 % LIQD Apply topically as needed.    [provider]  methocarbamol (ROBAXIN) 750 MG tablet Take 750 mg by mouth every 6 (six) hours as needed for muscle spasms.    [provider]  metoCLOPramide (REGLAN) 5 MG tablet Take 5 mg by mouth in the morning and at bedtime.    [provider]  metoprolol succinate (TOPROL-XL) 25 MG 24 hr tablet Take 25 mg by mouth daily. Take with or immediately following a meal.    [provider]  miconazole (MICOTIN) 2 % powder Apply topically 2 (two) times daily.  Apply to groin and pannus topically    [provider]  MYRBETRIQ 50 MG TB24 tablet TAKE 1 TABLET BY MOUTH DAILY. 12/12/18   Mast, Man X, NP  nitroGLYCERIN (NITROSTAT) 0.4 MG SL tablet Place 0.4 mg under the tongue every 5 (five) minutes as needed for chest pain. X 3 doses 07/19/18   [provider]  pantoprazole (PROTONIX) 40 MG tablet Take 1 tablet (40 mg total) by mouth 2 (two) times daily. 08/05/22   Medina-Vargas, Monina C, NP  polyethylene glycol (MIRALAX  / GLYCOLAX) 17 g packet Take 17 g by mouth daily.    [provider]  potassium chloride (MICRO-K) 10 MEQ CR capsule Take 20 mEq by mouth 2 (two) times daily.    [provider]  predniSONE (DELTASONE) 5 MG tablet Take 5 mg by mouth daily. 07/21/22  [provider]  PSYLLIUM HUSK PO Take 0.4 g by mouth daily. May keep at bedside    [provider]  ranolazine (RANEXA) 1000 MG SR tablet TAKE 1 TABLET BY MOUTH TWICE DAILY. 12/12/18   Corky Crafts, MD  venlafaxine (EFFEXOR) 75 MG tablet Take 150 mg by mouth. 2 caps= 150 mg; oral  Once A Day    [provider]  zinc oxide 20 % ointment Apply 1 application topically as needed for irritation.    [provider]    Physical Exam: Vitals:   09/21/22 0358 09/21/22 0401 09/21/22 0500 09/21/22 0600  BP:  135/65 129/63 136/65  Pulse:  95 95 84  Resp:  (!) 25 (!) 24 (!) 21  Temp:      TempSrc:      SpO2:  94% 96% 98%  Weight: 91.7 kg      Physical Exam Vitals and nursing note reviewed.  Constitutional:      General: She is awake. She is not in acute distress.    Appearance: She is obese.  HENT:     Head: Normocephalic.     Nose: No rhinorrhea.     Mouth/Throat:     Mouth: Mucous membranes are moist.  Eyes:     General: No scleral icterus.    Pupils: Pupils are equal, round, and reactive to light.  Neck:     Vascular: No JVD.  Cardiovascular:     Rate and Rhythm: Normal rate and regular rhythm.     Heart sounds: S1 normal and S2 normal.  Pulmonary:     Effort: Tachypnea present.     Breath sounds: Examination of the right-lower field reveals decreased breath sounds and rales. Examination of the left-lower field reveals decreased breath sounds and rales. Decreased breath sounds and rales present. No wheezing or rhonchi.  Abdominal:     General: There is no distension.     Palpations: Abdomen is soft.  Musculoskeletal:     Cervical back: Neck supple.     Right lower leg:  Edema present.     Left lower leg: Edema present.  Skin:    General: Skin is warm and dry.  Neurological:     General: No focal deficit present.     Mental Status: She is alert and oriented to person, place, and time.  Psychiatric:        Mood and Affect: Mood normal.        Behavior: Behavior normal. Behavior is cooperative.     Data Reviewed:  Results are pending, will review when available.  Cardiac Studies & Procedures   CARDIAC CATHETERIZATION   CARDIAC CATHETERIZATION 01/12/2018   Narrative  Prox LAD to Mid LAD lesion is 25% stenosed.  Ost Cx to Dist Cx lesion is 90% stenosed. SVG to OM is widely patent.  Mid RCA lesion is 90% stenosed. SVG to PDA is patent. Patent stents in distal graft.  SVG to diagonal is occluded.  Mid LAD lesion is 90% stenosed. LIMA to LAD is patent.  The left ventricular systolic function is normal.  LV end diastolic pressure is normal. LVEDP 10 mm Hg.  The left ventricular ejection fraction is 55-65% by visual estimate.  There is no aortic valve stenosis.   Continue aggressive secondary prevention. Consider discharge later today.   Findings Coronary Findings Diagnostic  Dominance: Right   Left Anterior Descending Prox LAD to Mid LAD lesion is 25% stenosed. Mid LAD lesion is 90% stenosed. The lesion  is segmental.   Ramus Intermedius Vessel is small.   Left Circumflex Ost Cx to Dist Cx lesion is 90% stenosed. The lesion is segmental.   Right Coronary Artery Mid RCA lesion is 90% stenosed.   Single Graft Graft To 2nd Mrg and is normal in caliber. The graft exhibits minimal luminal irregularities. Dist Graft lesion is 25% stenosed.   LIMA Graft To Dist LAD LIMA and is normal in caliber. Y graft with LIMA to LAD and SVG to diagonal.  LIMA portion is patent while vein graft to diagonal is occluded.   Single Graft Graft To 2nd Diag Y graft. LIMA to LAD (patent); SVG to diagonal (occluded) Prox Graft lesion is 100%  stenosed. Dist Graft lesion is 25% stenosed.   Single Graft Graft To RPDA and is large. Non-stenotic Dist Graft lesion was previously treated. Focal 90% with moderate disease on both sides.   Intervention   No interventions have been documented.     CARDIAC CATHETERIZATION   CARDIAC CATHETERIZATION 09/20/2015   Narrative  Severe three vessel CAD.  Patent SVG to OM.  Patent SVG to PDA. Patent stents in this graft.  Y graft with free LIMA to LAD and SVG to diagonal. LIMA portion is patent while vein graft to diagonal is occluded.  Elevated LVEDP 33 mm Hg.   No change in coronary anatomy since October 2016. Patent stents in the SVG to PDA. Elevated LVEDP. Would diurese with Lasix to help reduce intravascular volume.   Findings Coronary Findings Diagnostic  Dominance: Right   Left Anterior Descending   Diffuse.   Ramus Intermedius . Vessel is small.   Left Circumflex Diffuse.   Right Coronary Artery   Single Graft Graft To 2nd Mrg SVG was injected is normal in caliber. The graft exhibits minimal luminal irregularities.   LIMA Graft To Dist LAD LIMA is normal in caliber. Y graft with LIMA to LAD and SVG to diagonal.  LIMA portion is patent while vein graft to diagonal is occluded.   Single Graft Graft To 2nd Diag SVG was injected . Y graft. LIMA to LAD (patent); SVG to diagonal (occluded)   Single Graft Graft To RPDA SVG was injected is large. Previously placed Dist Graft drug eluting stent is patent. Focal 90% with moderate disease on both sides.   Intervention   No interventions have been documented.   STRESS TESTS   NM MYOCAR MULTI W/SPECT W (Needs Review) 10/15/2018 This result has not been signed. Information might be incomplete.   Narrative  Defect 1: There is a medium defect of severe severity present in the basal anteroseptal, basal inferoseptal, mid anteroseptal, mid inferoseptal and apical septal location.  Findings consistent with prior  myocardial infarction.  This is an intermediate risk study.  There was no ST segment deviation noted during stress.  T wave inversion was noted at rest in the I and aVL leads. T wave inversion persisted and were unchanged.   There is absence of perfusion in the ventricular septum from apex to base, with no reversibility. Suggests previous infarction. No ischemia noted. EF visually appears to be 50%. Akinesis due to absent tracer uptake in ventricular septum, remainder of wall motion is grossly normal.   ECHOCARDIOGRAM   ECHOCARDIOGRAM COMPLETE 07/18/2018   Narrative ECHOCARDIOGRAM REPORT IMPRESSIONS:     1. The left ventricle has normal systolic function, with an ejection fraction of 55-60%. The cavity size was normal. Left ventricular diastolic Doppler parameters are consistent with impaired relaxation. No evidence of  left ventricular regional wall motion abnormalities. 2. The right ventricle has normal systolic function. The cavity was normal. There is no increase in right ventricular wall thickness. 3. Trivial pericardial effusion is present. 4. There is mild mitral annular calcification present. No evidence of mitral valve stenosis. No significant mitral regurgitation. 5. The aortic valve is tricuspid. Mild calcification of the aortic valve. No stenosis of the aortic valve. 6. The aortic root and ascending aorta are normal in size and structure. 7. The IVC is normal. No complete TR doppler jet so unable to estimate PA systolic pressure.   FINDINGS Left Ventricle: The left ventricle has normal systolic function, with an ejection fraction of 55-60%. The cavity size was normal. There is no increase in left ventricular wall thickness. Left ventricular diastolic Doppler parameters are consistent with impaired relaxation. No evidence of left ventricular regional wall motion abnormalities..   Right Ventricle: The right ventricle has normal systolic function. The cavity was normal. There  is no increase in right ventricular wall thickness.   Left Atrium: Left atrial size was normal in size.   Right Atrium: Right atrial size was normal in size.   Interatrial Septum: No atrial level shunt detected by color flow Doppler.   Pericardium: Trivial pericardial effusion is present.   Mitral Valve: The mitral valve is normal in structure. There is mild mitral annular calcification present. Mitral valve regurgitation is not visualized by color flow Doppler. No evidence of mitral valve stenosis.   Tricuspid Valve: The tricuspid valve is normal in structure. Tricuspid valve regurgitation was not visualized by color flow Doppler.   Aortic Valve: The aortic valve is tricuspid Mild calcification of the aortic valve. Aortic valve regurgitation was not visualized by color flow Doppler. There is No stenosis of the aortic valve.   Pulmonic Valve: The pulmonic valve was normal in structure. Pulmonic valve regurgitation is trivial by color flow Doppler.   Aorta: The aortic root and ascending aorta are normal in size and structure.   Venous: The inferior vena cava is normal in size with greater than 50% respiratory variability.      Assessment and Plan: Principal Problem:   Acute on chronic diastolic heart failure (HCC) Inpatient/stepdown. Supplemental oxygen as needed. Sodium and fluid restriction. Continue furosemide 20 mg IVP twice daily. Continue losartan and metoprolol. Monitor daily weights, intake and output. Monitor renal function electrolytes. Check transthoracic echocardiogram. Cardiology consult appreciated.  Active Problems:   CAD (coronary artery disease) Associated with:   Elevated troponin Likely due to demand ischemia. Cardiology consult appreciated. Check echocardiogram. Continue statin, clopidogrel, BB, nitrates and ranolazine.    Vitamin B12 deficiency anemia Continue cyanocobalamin 1000 mcg p.o. daily.    Essential hypertension Continue metoprolol 25 mg  p.o. daily. Continue losartan 25 mg p.o. daily.    PAD (peripheral artery disease) (HCC) Continue statin, ranolazine and clopidogrel.    Hyperlipidemia LDL goal <70 Continue atorvastatin 10 mg p.o. daily.    GERD (gastroesophageal reflux disease) : Continue pantoprazole 40 mg p.o. daily.    Hypothyroidism Continue levothyroxine 75 mcg p.o. daily.    Memory loss Continue Namenda 10 mg p.o. twice daily      Advance Care Planning:   Code Status: DNR   Consults: Cardiology Olga Millers, MD)  Family Communication:   Severity of Illness: The appropriate patient status for this patient is INPATIENT. Inpatient status is judged to be reasonable and necessary in order to provide the required intensity of service to ensure the patient's safety. The patient's  presenting symptoms, physical exam findings, and initial radiographic and laboratory data in the context of their chronic comorbidities is felt to place them at high risk for further clinical deterioration. Furthermore, it is not anticipated that the patient will be medically stable for discharge from the hospital within 2 midnights of admission.   * I certify that at the point of admission it is my clinical judgment that the patient will require inpatient hospital care spanning beyond 2 midnights from the point of admission due to high intensity of service, high risk for further deterioration and high frequency of surveillance required.*  Author: Bobette Mo, MD 09/21/2022 7:20 AM  For on call review [email or web address].   This document was prepared using Dragon voice recognition software and may contain some unintended transcription errors.

## 2022-09-21 NOTE — Procedures (Signed)
Pt was transported from the ER room 21 to the ICU, room 1233 via V60, BIPAP: 10/5, RR 14, 28% on full E cylinder. Sp02 97%, safe travel.

## 2022-09-21 NOTE — Consult Note (Addendum)
Cardiology Consultation   Patient ID: Erin Ingram MRN: [id number]; DOB: Jan 23, 1941  Admit date: 09/21/2022 Date of Consult: 09/21/2022  PCP:  Mast, Man X, NP   [facility] Providers Cardiologist:  Lance Muss, MD   {   Patient Profile:   Erin Ingram is a 82 y.o. female with a hx of CAD status post CABG 2000 (SVG/free LIMA Y graft to diagonal and distal LAD, SVG to OM1 and SVG to PDA)(DES to SVF to RCA 11/2014), polymyalgia rheumatica, TIA, OSA on CPAP, hypertension, hypothyroidism, hyperlipidemia, CKD, subclavian artery stenosis noted on cath in 2016, chronic HFpEF, morbid obesity who is being seen 09/21/2022 for the evaluation of CHF exacerbation at the request of Dr. Robb Matar.  History of Present Illness:   [name] is currently followed by Dr. Eldridge Dace and last seen in 08/24/2022.  She has known history of CABG in 2000 as noted above and underwent catheterization in 2017 that showed overall stable anatomy with an occluded SVG-diagonal.  Appears to have continued chest pain and another cath in October 2019 that showed patent LIMA to LAD, SVG to OM, SVG to PDA showed same occluded SVG to diagonal plan was for medical management.  And again July 2020 had another perfusion scan that showed no new ischemia and intermediate risk.  Last echo was in April 2020 that showed normal LVEF 55 to 60%.  At her last office visit she was reported to have no evidence of volume overload on exam, had stable but chronic complaints of shortness of breath.  Today patient is presenting with worsening complaints of shortness of breath and bilateral peripheral edema for the past 1 week.  Chronically has some degree of shortness of breath however this is now occurring at rest and with minimal activity.  She has an elevated BNP and chest x-ray suggestive of pulmonary congestion.  Oxygen saturations have been in the low to mid 80s on room air and had been placed on 2 L of nasal cannula.   Subsequently started on BiPAP and received a dose of IV Lasix 40mg .  Patient has elevated troponins that are flat.  [id number].  EKG showing no acute ischemic changes.  BNP 1700+.  Leukocytosis WBC 16 K.  Chest x-ray showing pulmonary congestion along with patchy opacities that may be due to additional edema or superimposed pneumonia.   Past Medical History:  Diagnosis Date   Anemia    Arthritis    "fingers, back, shoulders, hips" (04/10/2016)   Chronic diastolic CHF (congestive heart failure) (HCC)    a. normal EF, LVEDP at [facility] in 6/17 33 >> Lasix started    CKD (chronic kidney disease), stage III (HCC)    Coronary artery disease    a. s/p CABG 2000 (SVG/ free LIMA Y graft to the diagonal and distal LAD, SVG to the OM 1, and SVG to the PDA) . b. Cath 12/19/2014 90% dSVG to RCA s/p 2 overlapping DES. c. LHC 2016, 2017 no intervention except diuresis needed. d. Low risk nuc 03/2016. e. Cath 12/2017 patent LIMA-LAD, SVG-OM, SVG-PDA but occluded SVG to diag.    Depression    with anxious component   GERD (gastroesophageal reflux disease)    History of hiatal hernia    Hyperlipidemia    Hypothyroidism    Obesity    OSA on CPAP    PMR (polymyalgia rheumatica) (HCC)    Pneumonia    "2-3 times" (04/10/2016)   Stable angina    microvascular, improved with  Ranexa   Subclavian artery stenosis (HCC)    a. L by cath note in 2016.   TIA (transient ischemic attack)     Past Surgical History:  Procedure Laterality Date   BREAST BIOPSY Right 1964   CARDIAC CATHETERIZATION  08   patent grafts, no culprit lesions, EF 65%   CARDIAC CATHETERIZATION N/A 12/19/2014   Procedure: Left Heart Cath and Cors/Grafts Angiography;  Surgeon: Corky Crafts, MD;  Location: [facility] INVASIVE CV LAB;  Service: Cardiovascular;  Laterality: N/A;   CARDIAC CATHETERIZATION N/A 12/19/2014   Procedure: Coronary Stent Intervention;  Surgeon: Corky Crafts, MD;  Location: [facility] INVASIVE CV LAB;  Service: Cardiovascular;   Laterality: N/A;   CARDIAC CATHETERIZATION N/A 01/01/2015   Procedure: Left Heart Cath and Cors/Grafts Angiography;  Surgeon: Corky Crafts, MD;  Location: [facility] INVASIVE CV LAB;  Service: Cardiovascular;  Laterality: N/A;   CARDIAC CATHETERIZATION N/A 09/20/2015   Procedure: Left Heart Cath and Cors/Grafts Angiography;  Surgeon: Corky Crafts, MD;  Location: [facility] INVASIVE CV LAB;  Service: Cardiovascular;  Laterality: N/A;   CATARACT EXTRACTION W/ INTRAOCULAR LENS  IMPLANT, BILATERAL Bilateral 2011   COLONOSCOPY WITH PROPOFOL N/A 07/27/2016   Procedure: COLONOSCOPY WITH PROPOFOL;  Surgeon: Charlott Rakes, MD;  Location: [facility] ENDOSCOPY;  Service: Endoscopy;  Laterality: N/A;   CORONARY ARTERY BYPASS GRAFT  2000   ASCVD, multivessel, S./P.   DILATION AND CURETTAGE OF UTERUS  1980s   ESOPHAGOGASTRODUODENOSCOPY (EGD) WITH PROPOFOL N/A 07/27/2016   Procedure: ESOPHAGOGASTRODUODENOSCOPY (EGD) WITH PROPOFOL;  Surgeon: Charlott Rakes, MD;  Location: [facility] ENDOSCOPY;  Service: Endoscopy;  Laterality: N/A;   FRACTURE SURGERY     LEFT HEART CATH AND CORS/GRAFTS ANGIOGRAPHY N/A 01/12/2018   Procedure: LEFT HEART CATH AND CORS/GRAFTS ANGIOGRAPHY;  Surgeon: Corky Crafts, MD;  Location: [facility] INVASIVE CV LAB;  Service: Cardiovascular;  Laterality: N/A;   PATELLA FRACTURE SURGERY Left 1993     Inpatient Medications: Scheduled Meds:  furosemide  20 mg Intravenous BID   potassium chloride  40 mEq Oral Once   Continuous Infusions:  PRN Meds: acetaminophen **OR** acetaminophen, ipratropium-albuterol  Allergies:    Allergies  Allergen Reactions   Nsaids Other (See Comments)    Due to chronic kidney failure   Butorphanol Other (See Comments)    agitation Constipation Produced a lot of urine, made patient feel crazy   Sulfa Antibiotics Rash   Bisoprolol Fumarate Other (See Comments)    Doesn't remember   Butorphanol Tartrate Other (See Comments)    Produced a lot of urine, made patient  feel crazy   Codeine Nausea And Vomiting   Demerol [Meperidine] Nausea And Vomiting   Imipramine     Sweating, facial dysfunction    Meperidine And Related Nausea And Vomiting   Statins     MYALGIAS   Tequin [Gatifloxacin] Other (See Comments)    Caused hypoglycemia Low blood sugar   Brilinta [Ticagrelor] Rash    CAUSES PETECHIAE, PURPURA   Pseudoephedrine Hcl Palpitations   Septra [Sulfamethoxazole-Trimethoprim] Rash   Sulfamethoxazole-Trimethoprim Rash    Social History:   Social History   Socioeconomic History   Marital status: Married    Spouse name: Not on file   Number of children: 2   Years of education: College   Highest education level: Not on file  Occupational History   Occupation: Retired Leisure centre manager  Tobacco Use   Smoking status: Former    Packs/day: 0.50    Years: 7.00    Additional  pack years: 0.00    Total pack years: 3.50    Types: Cigarettes    Quit date: 08/21/1969    Years since quitting: 53.1   Smokeless tobacco: Never  Vaping Use   Vaping Use: Never used  Substance and Sexual Activity   Alcohol use: No    Alcohol/week: 0.0 standard drinks of alcohol    Comment: rare   Drug use: No   Sexual activity: Never  Other Topics Concern   Not on file  Social History Narrative   Social History     Social History Narrative       Lives in [facility] with husband.   Diet: Regular   Do you drink/eat things with caffeine? 1 cup caffeine per day.  Not much coffee   Marital status: Married                           What year were you married? 1964   Do you live in a house, apartment, assisted living, condo, trailer, etc)? Here at [facility]   Is it one or more stories?   How many persons live in your home? 2   Do you have any pets in your home? No   Current or past profession: Metallurgist (RN)   Do you exercise? I did                                                Type & how often: Swimming, Tai Chi, exercise classes   Do you have a living  will? yes   Do you have a DNR Form? Yes   Do you have a POA/HPOA forms?             Social Determinants of Health   Financial Resource Strain: Low Risk  (12/07/2017)   Overall Financial Resource Strain (CARDIA)    Difficulty of Paying Living Expenses: Not hard at all  Food Insecurity: No Food Insecurity (12/07/2017)   Hunger Vital Sign    Worried About Running Out of Food in the Last Year: Never true    Ran Out of Food in the Last Year: Never true  Transportation Needs: No Transportation Needs (12/07/2017)   PRAPARE - Administrator, Civil Service (Medical): No    Lack of Transportation (Non-Medical): No  Physical Activity: Inactive (12/07/2017)   Exercise Vital Sign    Days of Exercise per Week: 0 days    Minutes of Exercise per Session: 0 min  Stress: Stress Concern Present (12/07/2017)   [employer] of Occupational Health - Occupational Stress Questionnaire    Feeling of Stress : To some extent  Social Connections: Somewhat Isolated (12/07/2017)   Social Connection and Isolation Panel [NHANES]    Frequency of Communication with Friends and Family: More than three times a week    Frequency of Social Gatherings with Friends and Family: More than three times a week    Attends Religious Services: Never    Database administrator or Organizations: No    Attends Banker Meetings: Never    Marital Status: Married  Catering manager Violence: Not At Risk (12/07/2017)   Humiliation, Afraid, Rape, and Kick questionnaire    Fear of Current or Ex-Partner: No    Emotionally Abused: No    Physically Abused:  No    Sexually Abused: No    Family History:    Family History  Problem Relation Age of Onset   Heart disease Mother    Heart attack Mother    Heart disease Father    Heart attack Father    Diabetes Brother    Pulmonary embolism Brother    Stroke Paternal Grandmother    Hypertension Neg Hx      ROS:  Please see the history of present illness.  All  other ROS reviewed and negative.     Physical Exam/Data:   Vitals:   09/21/22 0740 09/21/22 0745 09/21/22 0800 09/21/22 0810  BP: (!) 140/68  136/69   Pulse: 94  92   Resp: (!) 24  (!) 29   Temp:    98.4 F (36.9 C)  TempSrc:    Oral  SpO2: 97% 95% 94%   Weight:       No intake or output data in the 24 hours ending 09/21/22 0918    09/21/2022    3:58 AM 09/09/2022    8:57 AM 09/03/2022    1:55 PM  Last 3 Weights  Weight (lbs) 202 lb 3.2 oz 190 lb 3.2 oz 190 lb  Weight (kg) 91.717 kg 86.274 kg 86.183 kg     Body mass index is 42.26 kg/m.  General:  Morbidly obese  HEENT: normal Neck: difficult to assess Vascular: No carotid bruits; Distal pulses 2+ bilaterally Cardiac:  normal S1, S2; RRR; no murmur  Lungs:  diffuse wheezing, crackles  Abd: soft, nontender, no hepatomegaly  Ext: no edema Musculoskeletal:  No deformities, BUE and BLE strength normal and equal Skin: warm and dry  Neuro:  CNs 2-12 intact, no focal abnormalities noted Psych:  Normal affect   EKG:  The EKG was personally reviewed and demonstrates:  Sinus tachycardia, heart rate 103.  No Acute ST-T wave changes. Telemetry:  Telemetry was personally reviewed and demonstrates:  NSR 90s  Relevant CV Studies: CARDIAC CATHETERIZATION 21-Jan-2018   Narrative  Prox LAD to Mid LAD lesion is 25% stenosed.  Ost Cx to Dist Cx lesion is 90% stenosed. SVG to OM is widely patent.  Mid RCA lesion is 90% stenosed. SVG to PDA is patent. Patent stents in distal graft.  SVG to diagonal is occluded.  Mid LAD lesion is 90% stenosed. LIMA to LAD is patent.  The left ventricular systolic function is normal.  LV end diastolic pressure is normal. LVEDP 10 mm Hg.  The left ventricular ejection fraction is 55-65% by visual estimate.  There is no aortic valve stenosis.   Continue aggressive secondary prevention. Consider discharge later today.   Findings Coronary Findings Diagnostic  Dominance: Right   Left Anterior  Descending Prox LAD to Mid LAD lesion is 25% stenosed. Mid LAD lesion is 90% stenosed. The lesion is segmental.   Ramus Intermedius Vessel is small.   Left Circumflex Ost Cx to Dist Cx lesion is 90% stenosed. The lesion is segmental.   Right Coronary Artery Mid RCA lesion is 90% stenosed.   Single Graft Graft To 2nd Mrg and is normal in caliber. The graft exhibits minimal luminal irregularities. Dist Graft lesion is 25% stenosed.   LIMA Graft To Dist LAD LIMA and is normal in caliber. Y graft with LIMA to LAD and SVG to diagonal.  LIMA portion is patent while vein graft to diagonal is occluded.   Single Graft Graft To 2nd Diag Y graft. LIMA to LAD (patent); SVG to diagonal (  occluded) Prox Graft lesion is 100% stenosed. Dist Graft lesion is 25% stenosed.   Single Graft Graft To RPDA and is large. Non-stenotic Dist Graft lesion was previously treated. Focal 90% with moderate disease on both sides.   Intervention   No interventions have been documented.     CARDIAC CATHETERIZATION   CARDIAC CATHETERIZATION 09/20/2015   Narrative  Severe three vessel CAD.  Patent SVG to OM.  Patent SVG to PDA. Patent stents in this graft.  Y graft with free LIMA to LAD and SVG to diagonal. LIMA portion is patent while vein graft to diagonal is occluded.  Elevated LVEDP 33 mm Hg.   No change in coronary anatomy since October 2016. Patent stents in the SVG to PDA. Elevated LVEDP. Would diurese with Lasix to help reduce intravascular volume.   Findings Coronary Findings Diagnostic  Dominance: Right   Left Anterior Descending   Diffuse.   Ramus Intermedius . Vessel is small.   Left Circumflex Diffuse.   Right Coronary Artery   Single Graft Graft To 2nd Mrg SVG was injected is normal in caliber. The graft exhibits minimal luminal irregularities.   LIMA Graft To Dist LAD LIMA is normal in caliber. Y graft with LIMA to LAD and SVG to diagonal.  LIMA portion is patent while  vein graft to diagonal is occluded.   Single Graft Graft To 2nd Diag SVG was injected . Y graft. LIMA to LAD (patent); SVG to diagonal (occluded)   Single Graft Graft To RPDA SVG was injected is large. Previously placed Dist Graft drug eluting stent is patent. Focal 90% with moderate disease on both sides.   Intervention   No interventions have been documented.   STRESS TESTS   NM MYOCAR MULTI W/SPECT W 10/15/2018 This result has not been signed. Information might be incomplete.   Narrative  Defect 1: There is a medium defect of severe severity present in the basal anteroseptal, basal inferoseptal, mid anteroseptal, mid inferoseptal and apical septal location.  Findings consistent with prior myocardial infarction.  This is an intermediate risk study.  There was no ST segment deviation noted during stress.  T wave inversion was noted at rest in the I and aVL leads. T wave inversion persisted and were unchanged.   There is absence of perfusion in the ventricular septum from apex to base, with no reversibility. Suggests previous infarction. No ischemia noted. EF visually appears to be 50%. Akinesis due to absent tracer uptake in ventricular septum, remainder of wall motion is grossly normal.   ECHOCARDIOGRAM   ECHOCARDIOGRAM COMPLETE 07/18/2018   Narrative ECHOCARDIOGRAM REPORT   IMPRESSIONS     1. The left ventricle has normal systolic function, with an ejection fraction of 55-60%. The cavity size was normal. Left ventricular diastolic Doppler parameters are consistent with impaired relaxation. No evidence of left ventricular regional wall motion abnormalities. 2. The right ventricle has normal systolic function. The cavity was normal. There is no increase in right ventricular wall thickness. 3. Trivial pericardial effusion is present. 4. There is mild mitral annular calcification present. No evidence of mitral valve stenosis. No significant mitral regurgitation. 5. The  aortic valve is tricuspid. Mild calcification of the aortic valve. No stenosis of the aortic valve. 6. The aortic root and ascending aorta are normal in size and structure. 7. The IVC is normal. No complete TR doppler jet so unable to estimate PA systolic pressure.   FINDINGS Left Ventricle: The left ventricle has normal systolic function, with an ejection  fraction of 55-60%. The cavity size was normal. There is no increase in left ventricular wall thickness. Left ventricular diastolic Doppler parameters are consistent with impaired relaxation. No evidence of left ventricular regional wall motion abnormalities..   Right Ventricle: The right ventricle has normal systolic function. The cavity was normal. There is no increase in right ventricular wall thickness.   Left Atrium: Left atrial size was normal in size.   Right Atrium: Right atrial size was normal in size.   Interatrial Septum: No atrial level shunt detected by color flow Doppler.   Pericardium: Trivial pericardial effusion is present.   Mitral Valve: The mitral valve is normal in structure. There is mild mitral annular calcification present. Mitral valve regurgitation is not visualized by color flow Doppler. No evidence of mitral valve stenosis.   Tricuspid Valve: The tricuspid valve is normal in structure. Tricuspid valve regurgitation was not visualized by color flow Doppler.   Aortic Valve: The aortic valve is tricuspid Mild calcification of the aortic valve. Aortic valve regurgitation was not visualized by color flow Doppler. There is No stenosis of the aortic valve.   Pulmonic Valve: The pulmonic valve was normal in structure. Pulmonic valve regurgitation is trivial by color flow Doppler.   Aorta: The aortic root and ascending aorta are normal in size and structure.   Venous: The inferior vena cava is normal in size with greater than 50% respiratory variability.  Laboratory Data:  High Sensitivity Troponin:   Recent Labs   Lab 09/21/22 0423 09/21/22 0627  TROPONINIHS 278* 270*     Chemistry Recent Labs  Lab 09/21/22 0423 09/21/22 0627  NA 139  --   K 3.9  --   CL 107  --   CO2 20*  --   GLUCOSE 122*  --   BUN 30*  --   CREATININE 1.35*  --   CALCIUM 7.8*  --   MG  --  1.7  GFRNONAA 39*  --   ANIONGAP 12  --     Recent Labs  Lab 09/21/22 0423  PROT 6.1*  ALBUMIN 3.0*  AST 15  ALT 13  ALKPHOS 37*  BILITOT 0.5   Lipids No results for input(s): "CHOL", "TRIG", "HDL", "LABVLDL", "LDLCALC", "CHOLHDL" in the last 168 hours.  Hematology Recent Labs  Lab 09/21/22 0423  WBC 16.0*  RBC 2.77*  HGB 9.0*  HCT 29.1*  MCV 105.1*  MCH 32.5  MCHC 30.9  RDW 13.8  PLT 310   Thyroid No results for input(s): "TSH", "FREET4" in the last 168 hours.  BNP Recent Labs  Lab 09/21/22 0423  BNP 1,723.8*    DDimer No results for input(s): "DDIMER" in the last 168 hours.   Radiology/Studies:  DG Chest Portable 1 View  Result Date: 09/21/2022 CLINICAL DATA:  Dyspnea and shortness of breath. 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 Thoracic aorta is heavily calcified with stable mediastinal borders. There is increased central vascular engorgement and moderate perihilar interstitial and ground-glass edema. Interstitial edema continues into the bases where there are small pleural effusions forming, as well as patchy opacities which may represent additional edema or superimposed pneumonia. There are degenerative changes and slight dextroscoliosis thoracic spine. IMPRESSION: 1. CHF or fluid overload with perihilar interstitial and ground-glass edema. 2. Developing small pleural effusions. 3. Patchy opacities in the bases which may be due to additional edema or superimposed pneumonia. Electronically Signed   By: Mellody Dance  Chesser M.D.   On: 09/21/2022 05:53     Assessment and Plan:   Acute on chronic HFpEF Patient presenting with  worsening complaints of shortness of breath for the last week.  States that this occurs at rest and with minimal activity now and at rest now.  Chronically has some degree of shortness of breath however now significantly limited even at rest.  BNP 1700+.  Evidence of pulmonary congestion on chest x-ray.  Very wheezy on exam. 1+ pitting edema in the bilateral lower extremities.  Has been started on BiPAP and given IV 40 mg with very good output. Continue to wean off BiPAP as tolerated. Requiring neb treatments Increasing IV lasix 40mg  BID.  Has had great output with full canister in room.  Continue losartan, Toprol-XL 25 g daily. Not a great candidate for SGLT2i due to obesity and panus.  Continue strict I's and O's and daily weights. Can consider some degree of possible pneumonia given elevated white count.  However this does seem primarily cardiac. Continue to monitor. Echo pending  CAD S/p CABG 2000 (occluded SVG to diag, patent LIMA-LAD, patent SVG-OM, patent SVG-PDA and patent stents in distal graft on cath 2019) Elevated troponins and flat 270s No anginal sxs.  Likely due to demand ischemia.  No need for ischemic evaluation. Continue plavix, statin, BB, and renexa, imdur   HTN Well controlled. Continue as above.  Hyperlipidemia Will check LDL.   CKD stage IIIb Appears to be near her baseline.  Creatinine 1.35.  Continue to monitor.  Morbid obesity  Risk Assessment/Risk Scores:   New York Heart Association (NYHA) Functional Class NYHA Class III   For questions or updates, please contact [facility] Please consult [email or web address] for contact info under   Signed, Abagail Kitchens, PA-C  09/21/2022 9:18 AM  As above, patient seen and examined.  Briefly she is an 82 year old female with past medical history of coronary artery disease status post coronary bypass and graft, chronic diastolic congestive heart failure, prior TIA, obstructive sleep apnea, hypertension,  hyperlipidemia, chronic stage IIIa kidney disease, subclavian stenosis for evaluation of acute on chronic diastolic congestive heart failure.  Last echocardiogram April 2020 showed normal LV function.  Patient has chronic dyspnea on exertion.  However the past week this has worsened significantly.  She has dyspnea with minimal activities.  She also noticed increased bilateral lower extremity edema.  There was no chest pain, fevers, chills or productive cough.  She has been admitted and cardiology asked to evaluate.  Chest x-ray shows CHF.  BUN 30, creatinine 1.35, BNP 1723, troponin 278 and 270, hemoglobin 9, electrocardiogram shows sinus rhythm and prior septal infarct cannot be excluded with nonspecific lateral T wave inversion. 1 acute on chronic diastolic congestive heart failure-patient presents with worsening CHF symptoms.  BNP is elevated and chest x-ray showed CHF.  Will increase Lasix to 40 mg IV twice daily and follow renal function.  Will not add SGLT2 inhibitor due to pannus/obesity and risk of infection.  Repeat echocardiogram.  2 mildly elevated troponin-no clear trend and patient not having chest pain.  Not consistent with acute coronary syndrome.  No plans for further ischemia evaluation.  3 coronary artery disease-continue Plavix and statin.  4 hyperlipidemia-continue statin.  5 hypertension-continue preadmission blood pressure medications and follow.  6 chronic stage IIIa kidney disease-follow renal function closely with diuresis.  Olga Millers, MD

## 2022-09-22 ENCOUNTER — Inpatient Hospital Stay (HOSPITAL_COMMUNITY): Payer: Medicare Other

## 2022-09-22 DIAGNOSIS — I5031 Acute diastolic (congestive) heart failure: Secondary | ICD-10-CM | POA: Diagnosis not present

## 2022-09-22 DIAGNOSIS — I5033 Acute on chronic diastolic (congestive) heart failure: Secondary | ICD-10-CM | POA: Diagnosis not present

## 2022-09-22 LAB — BASIC METABOLIC PANEL
Anion gap: 12 (ref 5–15)
BUN: 29 mg/dL — ABNORMAL HIGH (ref 8–23)
CO2: 23 mmol/L (ref 22–32)
Calcium: 7.9 mg/dL — ABNORMAL LOW (ref 8.9–10.3)
Chloride: 101 mmol/L (ref 98–111)
Creatinine, Ser: 1.33 mg/dL — ABNORMAL HIGH (ref 0.44–1.00)
GFR, Estimated: 40 mL/min — ABNORMAL LOW (ref >60)
Glucose, Bld: 100 mg/dL — ABNORMAL HIGH (ref 70–99)
Potassium: 3.3 mmol/L — ABNORMAL LOW (ref 3.5–5.1)
Sodium: 136 mmol/L (ref 135–145)

## 2022-09-22 LAB — CBC
HCT: 30.7 % — ABNORMAL LOW (ref 36.0–46.0)
Hemoglobin: 9.3 g/dL — ABNORMAL LOW (ref 12.0–15.0)
MCH: 32.1 pg (ref 26.0–34.0)
MCHC: 30.3 g/dL (ref 30.0–36.0)
MCV: 105.9 fL — ABNORMAL HIGH (ref 80.0–100.0)
Platelets: 299 10*3/uL (ref 150–400)
RBC: 2.9 MIL/uL — ABNORMAL LOW (ref 3.87–5.11)
RDW: 14 % (ref 11.5–15.5)
WBC: 14.1 10*3/uL — ABNORMAL HIGH (ref 4.0–10.5)
nRBC: 0.2 % (ref 0.0–0.2)

## 2022-09-22 LAB — SEDIMENTATION RATE: Sed Rate: 48 mm/hr — ABNORMAL HIGH (ref 0–22)

## 2022-09-22 LAB — ECHOCARDIOGRAM COMPLETE
AR max vel: 1.69 cm2
AV Area VTI: 1.73 cm2
AV Area mean vel: 1.56 cm2
AV Mean grad: 5 mmHg
AV Peak grad: 8 mmHg
Ao pk vel: 1.42 m/s
Area-P 1/2: 4.68 cm2
Calc EF: 31.3 %
Height: 58 in
MV VTI: 1.65 cm2
S' Lateral: 3.5 cm
Single Plane A2C EF: 31.7 %
Single Plane A4C EF: 30.9 %
Weight: 3015.89 oz

## 2022-09-22 LAB — C-REACTIVE PROTEIN: CRP: 26.7 mg/dL — ABNORMAL HIGH (ref <1.0)

## 2022-09-22 LAB — PROCALCITONIN: Procalcitonin: 0.11 ng/mL

## 2022-09-22 MED ORDER — ENOXAPARIN SODIUM 40 MG/0.4ML IJ SOSY
40.0000 mg | PREFILLED_SYRINGE | INTRAMUSCULAR | Status: DC
  Administered 2022-09-22: 40 mg via SUBCUTANEOUS
  Filled 2022-09-22: qty 0.4

## 2022-09-22 MED ORDER — POTASSIUM CHLORIDE CRYS ER 20 MEQ PO TBCR
40.0000 meq | EXTENDED_RELEASE_TABLET | Freq: Two times a day (BID) | ORAL | Status: AC
  Administered 2022-09-22 (×2): 40 meq via ORAL
  Filled 2022-09-22 (×2): qty 2

## 2022-09-22 MED ORDER — PERFLUTREN LIPID MICROSPHERE
1.0000 mL | INTRAVENOUS | Status: AC | PRN
  Administered 2022-09-22: 3 mL via INTRAVENOUS

## 2022-09-22 MED ORDER — BUDESONIDE 0.25 MG/2ML IN SUSP
RESPIRATORY_TRACT | Status: AC
  Administered 2022-09-22: 0.25 mg via RESPIRATORY_TRACT
  Filled 2022-09-22: qty 2

## 2022-09-22 MED ORDER — BUDESONIDE 0.25 MG/2ML IN SUSP
0.2500 mg | Freq: Two times a day (BID) | RESPIRATORY_TRACT | Status: DC
  Administered 2022-09-22 – 2022-10-02 (×20): 0.25 mg via RESPIRATORY_TRACT
  Filled 2022-09-22 (×20): qty 2

## 2022-09-22 MED ORDER — MAGNESIUM SULFATE 2 GM/50ML IV SOLN
2.0000 g | Freq: Once | INTRAVENOUS | Status: AC
  Administered 2022-09-22: 2 g via INTRAVENOUS
  Filled 2022-09-22: qty 50

## 2022-09-22 MED ORDER — AZITHROMYCIN 250 MG PO TABS
500.0000 mg | ORAL_TABLET | Freq: Every day | ORAL | Status: AC
  Administered 2022-09-22 – 2022-09-26 (×5): 500 mg via ORAL
  Filled 2022-09-22 (×5): qty 2

## 2022-09-22 MED ORDER — LORAZEPAM 0.5 MG PO TABS
0.5000 mg | ORAL_TABLET | Freq: Every evening | ORAL | Status: AC | PRN
  Administered 2022-09-22: 0.5 mg via ORAL
  Filled 2022-09-22: qty 1

## 2022-09-22 MED ORDER — SODIUM CHLORIDE 0.9 % IV SOLN
2.0000 g | Freq: Every day | INTRAVENOUS | Status: DC
  Administered 2022-09-22 – 2022-09-23 (×2): 2 g via INTRAVENOUS
  Filled 2022-09-22 (×2): qty 20

## 2022-09-22 MED ORDER — IPRATROPIUM-ALBUTEROL 0.5-2.5 (3) MG/3ML IN SOLN
3.0000 mL | Freq: Four times a day (QID) | RESPIRATORY_TRACT | Status: DC
  Administered 2022-09-22 – 2022-09-26 (×19): 3 mL via RESPIRATORY_TRACT
  Filled 2022-09-22 (×18): qty 3

## 2022-09-22 MED ORDER — METHYLPREDNISOLONE SODIUM SUCC 40 MG IJ SOLR
40.0000 mg | Freq: Every day | INTRAMUSCULAR | Status: DC
  Administered 2022-09-22 – 2022-09-24 (×3): 40 mg via INTRAVENOUS
  Filled 2022-09-22 (×3): qty 1

## 2022-09-22 NOTE — TOC Initial Note (Addendum)
Transition of Care [facility]) - Initial/Assessment Note    Patient Details  Name: Erin Ingram MRN: [id number] Date of Birth: 10/02/1940  Transition of Care [facility]) CM/SW Contact:    Coralyn Helling, LCSW Phone Number: 09/22/2022, 1:04 PM  Clinical Narrative:    [facility] consulted for Heart failure home health screen. Patient admissions do not meet qualifications for screening.   Patient admitted from [facility]. Needs assistance with ADLs at baseline. PT eval pending. TOC will follow for disposition.                Expected Discharge Plan: Assisted Living Barriers to Discharge: Continued Medical Work up   Patient Goals and CMS Choice Patient states their goals for this hospitalization and ongoing recovery are:: Go home   Choice offered to / list presented to : NA      Expected Discharge Plan and Services In-house Referral: NA Discharge Planning Services: NA   Living arrangements for the past 2 months: Assisted Living Facility                                      Prior Living Arrangements/Services Living arrangements for the past 2 months: Assisted Living Facility Lives with:: Facility Resident Patient language and need for interpreter reviewed:: Yes        Need for Family Participation in Patient Care: Yes (Comment) Care giver support system in place?: Yes (comment) Current home services: DME Criminal Activity/Legal Involvement Pertinent to Current Situation/Hospitalization: No - Comment as needed  Activities of Daily Living Home Assistive Devices/Equipment: Wheelchair, Environmental consultant (specify type), Eyeglasses ADL Screening (condition at time of admission) Patient's cognitive ability adequate to safely complete daily activities?: Yes Is the patient deaf or have difficulty hearing?: No Does the patient have difficulty seeing, even when wearing glasses/contacts?: No Does the patient have difficulty concentrating, remembering, or making decisions?: No Patient  able to express need for assistance with ADLs?: Yes Does the patient have difficulty dressing or bathing?: Yes Independently performs ADLs?: No Communication: Needs assistance Is this a change from baseline?: Pre-admission baseline Dressing (OT): Needs assistance Is this a change from baseline?: Pre-admission baseline Grooming: Needs assistance Is this a change from baseline?: Pre-admission baseline Feeding: Independent Bathing: Needs assistance Is this a change from baseline?: Pre-admission baseline Toileting: Needs assistance Is this a change from baseline?: Pre-admission baseline In/Out Bed: Needs assistance Is this a change from baseline?: Pre-admission baseline Walks in Home: Needs assistance Is this a change from baseline?: Pre-admission baseline Does the patient have difficulty walking or climbing stairs?: Yes Weakness of Legs: Both Weakness of Arms/Hands: Both  Permission Sought/Granted Permission sought to share information with : Facility Medical sales representative, Family Supports Permission granted to share information with : Yes, Verbal Permission Granted     Permission granted to share info w AGENCY: [facility]        Emotional Assessment Appearance:: Appears stated age Attitude/Demeanor/Rapport: Engaged Affect (typically observed): Accepting Orientation: : Oriented to Self, Oriented to Place, Oriented to  Time, Oriented to Situation Alcohol / Substance Use: Not Applicable Psych Involvement: No (comment)  Admission diagnosis:  Acute on chronic diastolic heart failure (HCC) [I50.33] Acute pulmonary edema (HCC) [J81.0] Elevated troponin I level [R79.89] Anemia, unspecified type [D64.9] Patient Active Problem List   Diagnosis Date Noted   Acute on chronic diastolic heart failure (HCC) 09/21/2022   Elevated troponin 09/21/2022   Discomfort in chest  12/04/2021   Memory loss 06/06/2021   Scalp hematoma 03/28/2021   Anemia due to chronic kidney disease  08/26/2020   Urinary frequency 03/11/2020   Insomnia 10/17/2018   Chest pain, rule out acute myocardial infarction 10/14/2018   Weight gain 08/18/2018   Hypocalcemia 07/20/2018   Syncope 07/18/2018   CKD (chronic kidney disease) stage 3, GFR 30-59 ml/min (HCC) 06/10/2018   Constipation 06/02/2018   Allergic rhinitis 06/02/2018   Osteoporosis 06/02/2018   Tremor 06/15/2017   Gait instability 06/15/2017   Polymyalgia rheumatica (HCC) 06/11/2017   Fall 06/11/2017   Visual hallucination 06/10/2017   GERD (gastroesophageal reflux disease) 06/10/2017   Hypothyroidism 06/10/2017   Abnormal movement 06/10/2017   Dysphagia 07/27/2016   Irritable bowel syndrome with diarrhea 07/27/2016   Unstable angina pectoris (HCC) 04/10/2016   Bilateral lower extremity edema 03/13/2016   (HFpEF) heart failure with preserved ejection fraction (HCC) 10/08/2015   CAD (coronary artery disease) 10/08/2015   Subclavian arterial stenosis (HCC)    Hyperlipidemia LDL goal <70    PAD (peripheral artery disease) (HCC) 04/11/2015   Essential hypertension 04/11/2014   Morbid obesity (HCC) 03/22/2013   Vitamin B12 deficiency anemia 03/22/2013   Depression, recurrent (HCC) 03/22/2013   Generalized osteoarthritis of multiple sites 03/22/2013   Sleep apnea 03/22/2013   Impaired fasting glucose 03/22/2013   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 [place] [address] Phone: [phone] Fax: [phone]     Social Determinants of Health (SDOH) Social History: SDOH Screenings   Food Insecurity: No Food Insecurity (09/21/2022)  Housing: Low Risk  (09/21/2022)  Transportation Needs: No Transportation Needs (09/21/2022)  Utilities: Not At Risk (09/21/2022)  Financial Resource Strain: Low Risk  (12/07/2017)  Physical Activity:  Inactive (12/07/2017)  Social Connections: Somewhat Isolated (12/07/2017)  Stress: Stress Concern Present (12/07/2017)  Tobacco Use: Medium Risk (09/21/2022)   SDOH Interventions:     Readmission Risk Interventions    09/22/2022    1:02 PM  Readmission Risk Prevention Plan  Transportation Screening Complete  HRI or Home Care Consult Complete  Social Work Consult for Recovery Care Planning/Counseling Complete  Palliative Care Screening Not Applicable

## 2022-09-22 NOTE — Progress Notes (Addendum)
PROGRESS NOTE    Erin Ingram  DGL:[id number] DOB: 1940-04-26 DOA: 09/21/2022 PCP: Mast, Man X, NP   Brief Narrative: 82 year old with past medical history significant for microcytic anemia, osteoarthritis, stage III CKD, depression, GERD, hiatal hernia, hyperlipidemia, hypothyroidism, obesity, OSA on CPAP, polymyalgia rheumatica, history of multiple episode of pneumonia, subclavian artery stenosis, TIA, CAD, CABG chronic diastolic heart failure who [facility] with progressive worsening shortness of breath, lower extremity edema, orthopnea cough, and found to be hypoxic oxygen as low as 86%.  Symptoms started a week ago.  Patient admitted for acute hypoxic respiratory failure in the setting of acute diastolic heart failure exacerbation.  She was initially started on BiPAP .   Assessment & Plan:   Principal Problem:   Acute on chronic diastolic heart failure (HCC) Active Problems:   Vitamin B12 deficiency anemia   Essential hypertension   PAD (peripheral artery disease) (HCC)   Hyperlipidemia LDL goal <70   CAD (coronary artery disease)   GERD (gastroesophageal reflux disease)   Hypothyroidism   Memory loss   Elevated troponin  1-Acute on chronic diastolic heart failure exacerbation: -Patient presented with worsening shortness of breath, lower extremity edema, chest x-ray with finding consistent with fluid overload and Perry hilar interstitial and groundglass edema.  Developing small pleural effusion.  BNP at 1700, elevation of troponin -Patient on BiPAP -Strict I's and O's and daily weight -Continue with IV Lasix 40 mg twice daily -Continue with Cozaar and metoprolol -Negative 3 L.   Acute Hypoxic Respiratory Failure In setting HF and presume COPD exacerbation.  PNA Report history of smoking.  BL wheezing lung auscultation.  Start IV solumedrol---will need to transition to home prednisone over time.  Schedule Duo-neb. Start  Pulmicort.  BIPAP PRN>  IV lasix.   CAD, elevation of troponin: -Elevation of troponin in the setting of heart failure exacerbation -Continue with Lasix, metoprolol, Cozaar, Imdur, Lipitor, Plavix.  Leukocytosis:  -Chest x-ray: With patchy opacities in the bases which may be due to additional edema or superimposed pneumonia. -Will check procalcitonin level -started IV ceftriaxone and oral Azithromycin.   B12 deficiency anemia: Continue with B12 supplement.  Essential hypertension: Continue with Cozaar, metoprolol and Imdur.   PAD: Continue with Lipitor and Plavix.  Hyperlipidemia: Continue with Lipitor  GERD: Continue with PPI  Hypothyroidism: Continue with Synthroid  Memory loss, dementia: Continue with Namenda  Polymyalgia Rheumatica.  On Chronic prednisone 5 mg daily. Holding while on IV steroid.   Hypomagnesemia; replete  Hypokalemia; replete orally.    Estimated body mass index is 39.4 kg/m as calculated from the following:   Height as of this encounter: 4\' 10"  (1.473 m).   Weight as of this encounter: 85.5 kg.   DVT prophylaxis: Lovenox Code Status: DNR Family Communication: No family at bedside.  Disposition Plan:  Status is: Inpatient Remains inpatient appropriate because: management of Resp failure    Consultants:  Cardiology   Procedures:    Antimicrobials:    Subjective: She is on BIPAP> she denies chest pain. She is breathing better. Report cough.    Objective: Vitals:   09/22/22 0400 09/22/22 0450 09/22/22 0500 09/22/22 0600  BP: (!) 127/50  (!) 122/49 (!) 133/53  Pulse: 82  82 85  Resp: (!) 27  (!) 26 (!) 28  Temp:  99 F (37.2 C)    TempSrc:  Axillary    SpO2: 96%  96% 96%  Weight:  85.5 kg    Height:  Intake/Output Summary (Last 24 hours) at 09/22/2022 6578 Last data filed at 09/22/2022 0600 Gross per 24 hour  Intake --  Output 3000 ml  Net -3000 ml   Filed Weights   09/21/22 0358 09/21/22 1222 09/22/22 0450   Weight: 91.7 kg 85.7 kg 85.5 kg    Examination:  General exam: Appears calm and comfortable  Respiratory system: On BIPAP, Tachypnea, BL expiratory wheezing.  Cardiovascular system: S1 & S2 heard, RRR. Gastrointestinal system: Abdomen is nondistended, soft and nontender. No organomegaly or masses felt. Normal bowel sounds heard. Central nervous system: Alert and oriented. Extremities: Symmetric 5 x 5 power.   Data Reviewed: I have personally reviewed following labs and imaging studies  CBC: Recent Labs  Lab 09/21/22 0423  WBC 16.0*  NEUTROABS 12.9*  HGB 9.0*  HCT 29.1*  MCV 105.1*  PLT 310   Basic Metabolic Panel: Recent Labs  Lab 09/21/22 0423 09/21/22 0627  NA 139  --   K 3.9  --   CL 107  --   CO2 20*  --   GLUCOSE 122*  --   BUN 30*  --   CREATININE 1.35*  --   CALCIUM 7.8*  --   MG  --  1.7   GFR: Estimated Creatinine Clearance: 29.3 mL/min (A) (by C-G formula based on SCr of 1.35 mg/dL (H)). Liver Function Tests: Recent Labs  Lab 09/21/22 0423  AST 15  ALT 13  ALKPHOS 37*  BILITOT 0.5  PROT 6.1*  ALBUMIN 3.0*   No results for input(s): "LIPASE", "AMYLASE" in the last 168 hours. No results for input(s): "AMMONIA" in the last 168 hours. Coagulation Profile: No results for input(s): "INR", "PROTIME" in the last 168 hours. Cardiac Enzymes: No results for input(s): "CKTOTAL", "CKMB", "CKMBINDEX", "TROPONINI" in the last 168 hours. BNP (last 3 results) No results for input(s): "PROBNP" in the last 8760 hours. HbA1C: No results for input(s): "HGBA1C" in the last 72 hours. CBG: No results for input(s): "GLUCAP" in the last 168 hours. Lipid Profile: No results for input(s): "CHOL", "HDL", "LDLCALC", "TRIG", "CHOLHDL", "LDLDIRECT" in the last 72 hours. Thyroid Function Tests: No results for input(s): "TSH", "T4TOTAL", "FREET4", "T3FREE", "THYROIDAB" in the last 72 hours. Anemia Panel: No results for input(s): "VITAMINB12", "FOLATE", "FERRITIN",  "TIBC", "IRON", "RETICCTPCT" in the last 72 hours. Sepsis Labs: No results for input(s): "PROCALCITON", "LATICACIDVEN" in the last 168 hours.  Recent Results (from the past 240 hour(s))  Resp panel by RT-PCR (RSV, Flu A&B, Covid) Anterior Nasal Swab     Status: None   Collection Time: 09/21/22  4:23 AM   Specimen: Anterior Nasal Swab  Result Value Ref Range Status   SARS Coronavirus 2 by RT PCR NEGATIVE NEGATIVE Final    Comment: (NOTE) SARS-CoV-2 target nucleic acids are NOT DETECTED.  The SARS-CoV-2 RNA is generally detectable in upper respiratory specimens during the acute phase of infection. The lowest concentration of SARS-CoV-2 viral copies this assay can detect is 138 copies/mL. A negative result does not preclude SARS-Cov-2 infection and should not be used as the sole basis for treatment or other patient management decisions. A negative result may occur with  improper specimen collection/handling, submission of specimen other than nasopharyngeal swab, presence of viral mutation(s) within the areas targeted by this assay, and inadequate number of viral copies(<138 copies/mL). A negative result must be combined with clinical observations, patient history, and epidemiological information. The expected result is Negative.  Fact Sheet for Patients:  BloggerCourse.com  Fact Sheet for  Healthcare Providers:  SeriousBroker.it  This test is no t yet approved or cleared by the [place] and  has been authorized for detection and/or diagnosis of SARS-CoV-2 by FDA under an Emergency Use Authorization (EUA). This EUA will remain  in effect (meaning this test can be used) for the duration of the COVID-19 declaration under Section 564(b)(1) of the Act, 21 U.S.C.section 360bbb-3(b)(1), unless the authorization is terminated  or revoked sooner.       Influenza A by PCR NEGATIVE NEGATIVE Final   Influenza B by PCR NEGATIVE  NEGATIVE Final    Comment: (NOTE) The Xpert Xpress SARS-CoV-2/FLU/RSV plus assay is intended as an aid in the diagnosis of influenza from Nasopharyngeal swab specimens and should not be used as a sole basis for treatment. Nasal washings and aspirates are unacceptable for Xpert Xpress SARS-CoV-2/FLU/RSV testing.  Fact Sheet for Patients: BloggerCourse.com  Fact Sheet for Healthcare Providers: SeriousBroker.it  This test is not yet approved or cleared by the [place] FDA and has been authorized for detection and/or diagnosis of SARS-CoV-2 by FDA under an Emergency Use Authorization (EUA). This EUA will remain in effect (meaning this test can be used) for the duration of the COVID-19 declaration under Section 564(b)(1) of the Act, 21 U.S.C. section 360bbb-3(b)(1), unless the authorization is terminated or revoked.     Resp Syncytial Virus by PCR NEGATIVE NEGATIVE Final    Comment: (NOTE) Fact Sheet for Patients: BloggerCourse.com  Fact Sheet for Healthcare Providers: SeriousBroker.it  This test is not yet approved or cleared by the [place] FDA and has been authorized for detection and/or diagnosis of SARS-CoV-2 by FDA under an Emergency Use Authorization (EUA). This EUA will remain in effect (meaning this test can be used) for the duration of the COVID-19 declaration under Section 564(b)(1) of the Act, 21 U.S.C. section 360bbb-3(b)(1), unless the authorization is terminated or revoked.  Performed at [facility], [address], [place], [place] [address]   MRSA Next Gen by PCR, Nasal     Status: None   Collection Time: 09/21/22 12:24 PM   Specimen: Nasal Mucosa; Nasal Swab  Result Value Ref Range Status   MRSA by PCR Next Gen NOT DETECTED NOT DETECTED Final    Comment: (NOTE) The GeneXpert MRSA Assay (FDA approved for NASAL specimens only), is  one component of a comprehensive MRSA colonization surveillance program. It is not intended to diagnose MRSA infection nor to guide or monitor treatment for MRSA infections. Test performance is not FDA approved in patients less than 39 years old. Performed at [facility], [address] [place], [place] [address]          Radiology Studies: DG Chest Portable 1 View  Result Date: 09/21/2022 CLINICAL DATA:  Dyspnea and shortness of breath. 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 Thoracic aorta is heavily calcified with stable mediastinal borders. There is increased central vascular engorgement and moderate perihilar interstitial and ground-glass edema. Interstitial edema continues into the bases where there are small pleural effusions forming, as well as patchy opacities which may represent additional edema or superimposed pneumonia. There are degenerative changes and slight dextroscoliosis thoracic spine. IMPRESSION: 1. CHF or fluid overload with perihilar interstitial and ground-glass edema. 2. Developing small pleural effusions. 3. Patchy opacities in the bases which may be due to additional edema or superimposed pneumonia. Electronically Signed   By: Earlean Shawl.D.  On: 09/21/2022 05:53        Scheduled Meds:  atorvastatin  10 mg Oral q1800   Chlorhexidine Gluconate Cloth  6 each Topical Daily   clopidogrel  75 mg Oral Daily   cyanocobalamin  1,000 mcg Oral Daily   furosemide  40 mg Intravenous BID   isosorbide mononitrate  120 mg Oral Daily   levothyroxine  75 mcg Oral Q0600   losartan  25 mg Oral Daily   memantine  10 mg Oral BID   metoCLOPramide  5 mg Oral BID   metoprolol succinate  25 mg Oral Daily   mirabegron ER  50 mg Oral Daily   mouth rinse  15 mL Mouth Rinse 4 times per day   pantoprazole  40 mg Oral BID   predniSONE  5 mg Oral QAC breakfast   ranolazine  1,000 mg  Oral BID   venlafaxine XR  150 mg Oral Daily   Continuous Infusions:   LOS: 1 day    Time spent: 35 minutes     Charnell Peplinski A Shonia Skilling, MD [facility]   If 7PM-7AM, please contact night-coverage [email or web address]  09/22/2022, 7:02 AM

## 2022-09-22 NOTE — Progress Notes (Signed)
Pt given neb tx and taken off bipap.  Will attempt to wear 4l  after neb tx.

## 2022-09-22 NOTE — Progress Notes (Signed)
Rounding Note    Patient Name: Erin Ingram Date of Encounter: 09/22/2022  [facility] Cardiologist: Lance Muss, MD   Subjective   No CP; dyspnea improving but persists  Inpatient Medications    Scheduled Meds:  atorvastatin  10 mg Oral q1800   azithromycin  500 mg Oral Daily   budesonide (PULMICORT) nebulizer solution  0.25 mg Nebulization BID   Chlorhexidine Gluconate Cloth  6 each Topical Daily   clopidogrel  75 mg Oral Daily   cyanocobalamin  1,000 mcg Oral Daily   enoxaparin (LOVENOX) injection  40 mg Subcutaneous Q24H   furosemide  40 mg Intravenous BID   ipratropium-albuterol  3 mL Nebulization Q6H   isosorbide mononitrate  120 mg Oral Daily   levothyroxine  75 mcg Oral Q0600   losartan  25 mg Oral Daily   memantine  10 mg Oral BID   methylPREDNISolone (SOLU-MEDROL) injection  40 mg Intravenous Daily   metoCLOPramide  5 mg Oral BID   metoprolol succinate  25 mg Oral Daily   mirabegron ER  50 mg Oral Daily   mouth rinse  15 mL Mouth Rinse 4 times per day   pantoprazole  40 mg Oral BID   ranolazine  1,000 mg Oral BID   venlafaxine XR  150 mg Oral Daily   Continuous Infusions:  cefTRIAXone (ROCEPHIN)  IV     PRN Meds: acetaminophen **OR** acetaminophen, ipratropium-albuterol, nitroGLYCERIN, mouth rinse   Vital Signs    Vitals:   09/22/22 0450 09/22/22 0500 09/22/22 0600 09/22/22 0818  BP:  (!) 122/49 (!) 133/53   Pulse:  82 85   Resp:  (!) 26 (!) 28   Temp: 99 F (37.2 C)     TempSrc: Axillary     SpO2:  96% 96% 100%  Weight: 85.5 kg     Height:        Intake/Output Summary (Last 24 hours) at 09/22/2022 0838 Last data filed at 09/22/2022 0600 Gross per 24 hour  Intake --  Output 3000 ml  Net -3000 ml      09/22/2022    4:50 AM 09/21/2022   12:22 PM 09/21/2022    3:58 AM  Last 3 Weights  Weight (lbs) 188 lb 7.9 oz 188 lb 15 oz 202 lb 3.2 oz  Weight (kg) 85.5 kg 85.7 kg 91.717 kg      Telemetry     Sinus- Personally  Reviewed   Physical Exam   GEN: No acute distress.  Obese Neck: No JVD Cardiac: RRR, no murmurs, rubs, or gallops.  Respiratory: Diminished breath sounds bases with expiratory wheeze. GI: Soft, nontender, non-distended  MS: No edema Neuro:  Nonfocal  Psych: Normal affect   Labs    High Sensitivity Troponin:   Recent Labs  Lab 09/21/22 0423 09/21/22 0627  TROPONINIHS 278* 270*     Chemistry Recent Labs  Lab 09/21/22 0423 09/21/22 0627 09/22/22 0744  NA 139  --  136  K 3.9  --  3.3*  CL 107  --  101  CO2 20*  --  23  GLUCOSE 122*  --  100*  BUN 30*  --  29*  CREATININE 1.35*  --  1.33*  CALCIUM 7.8*  --  7.9*  MG  --  1.7  --   PROT 6.1*  --   --   ALBUMIN 3.0*  --   --   AST 15  --   --   ALT 13  --   --  ALKPHOS 37*  --   --   BILITOT 0.5  --   --   GFRNONAA 39*  --  40*  ANIONGAP 12  --  12     Hematology Recent Labs  Lab 09/21/22 0423 09/22/22 0744  WBC 16.0* 14.1*  RBC 2.77* 2.90*  HGB 9.0* 9.3*  HCT 29.1* 30.7*  MCV 105.1* 105.9*  MCH 32.5 32.1  MCHC 30.9 30.3  RDW 13.8 14.0  PLT 310 299   Thyroid No results for input(s): "TSH", "FREET4" in the last 168 hours.  BNP Recent Labs  Lab 09/21/22 0423  BNP 1,723.8*      Radiology    DG Chest Portable 1 View  Result Date: 09/21/2022 CLINICAL DATA:  Dyspnea and shortness of breath. 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 Thoracic aorta is heavily calcified with stable mediastinal borders. There is increased central vascular engorgement and moderate perihilar interstitial and ground-glass edema. Interstitial edema continues into the bases where there are small pleural effusions forming, as well as patchy opacities which may represent additional edema or superimposed pneumonia. There are degenerative changes and slight dextroscoliosis thoracic spine. IMPRESSION: 1. CHF or fluid overload with perihilar interstitial and  ground-glass edema. 2. Developing small pleural effusions. 3. Patchy opacities in the bases which may be due to additional edema or superimposed pneumonia. Electronically Signed   By: Almira Bar M.D.   On: 09/21/2022 05:53     Patient Profile     82 year old female with past medical history of coronary artery disease status post coronary bypass and graft, chronic diastolic congestive heart failure, prior TIA, obstructive sleep apnea, hypertension, hyperlipidemia, chronic stage IIIa kidney disease, subclavian stenosis for evaluation of acute on chronic diastolic congestive heart failure. Last echocardiogram April 2020 showed normal LV function.   Assessment & Plan    1 acute on chronic diastolic congestive heart failure-I/O-3000.  She has improved but dyspnea persists.  Will continue Lasix 40 mg IV twice daily today.  Await follow-up echocardiogram. Will not add SGLT2 inhibitor due to pannus/obesity and risk of infection.     2 mildly elevated troponin-no clear trend and patient not having chest pain.  Not consistent with acute coronary syndrome.  No plans for further ischemia evaluation.   3 coronary artery disease-continue Plavix and statin.   4 hyperlipidemia-continue statin.   5 hypertension-continue preadmission blood pressure medications and follow.   6 chronic stage IIIa kidney disease-follow renal function closely with diuresis.  7 hypokalemia-supplement  For questions or updates, please contact [facility] Please consult [email or web address] for contact info under        Signed, Olga Millers, MD  09/22/2022, 8:38 AM

## 2022-09-22 NOTE — Plan of Care (Signed)

## 2022-09-22 NOTE — Progress Notes (Signed)
Heart Failure Navigator Progress Note  Assessed for Heart & Vascular TOC clinic readiness.  Patient does not meet criteria due to per MD note with Dementia. .   Navigator will sign off at this time.   Rhae Hammock, BSN, Scientist, clinical (histocompatibility and immunogenetics) Only

## 2022-09-23 DIAGNOSIS — I5033 Acute on chronic diastolic (congestive) heart failure: Secondary | ICD-10-CM | POA: Diagnosis not present

## 2022-09-23 LAB — CBC
HCT: 28.5 % — ABNORMAL LOW (ref 36.0–46.0)
Hemoglobin: 8.6 g/dL — ABNORMAL LOW (ref 12.0–15.0)
MCH: 32.1 pg (ref 26.0–34.0)
MCHC: 30.2 g/dL (ref 30.0–36.0)
MCV: 106.3 fL — ABNORMAL HIGH (ref 80.0–100.0)
Platelets: 292 10*3/uL (ref 150–400)
RBC: 2.68 MIL/uL — ABNORMAL LOW (ref 3.87–5.11)
RDW: 13.5 % (ref 11.5–15.5)
WBC: 16.9 10*3/uL — ABNORMAL HIGH (ref 4.0–10.5)
nRBC: 0 % (ref 0.0–0.2)

## 2022-09-23 LAB — BASIC METABOLIC PANEL
Anion gap: 10 (ref 5–15)
BUN: 40 mg/dL — ABNORMAL HIGH (ref 8–23)
CO2: 25 mmol/L (ref 22–32)
Calcium: 8.1 mg/dL — ABNORMAL LOW (ref 8.9–10.3)
Chloride: 103 mmol/L (ref 98–111)
Creatinine, Ser: 1.44 mg/dL — ABNORMAL HIGH (ref 0.44–1.00)
GFR, Estimated: 36 mL/min — ABNORMAL LOW (ref >60)
Glucose, Bld: 122 mg/dL — ABNORMAL HIGH (ref 70–99)
Potassium: 4.7 mmol/L (ref 3.5–5.1)
Sodium: 138 mmol/L (ref 135–145)

## 2022-09-23 LAB — MAGNESIUM: Magnesium: 2.2 mg/dL (ref 1.7–2.4)

## 2022-09-23 MED ORDER — ENOXAPARIN SODIUM 30 MG/0.3ML IJ SOSY
30.0000 mg | PREFILLED_SYRINGE | INTRAMUSCULAR | Status: DC
  Administered 2022-09-23 – 2022-09-24 (×2): 30 mg via SUBCUTANEOUS
  Filled 2022-09-23 (×2): qty 0.3

## 2022-09-23 MED ORDER — ISOSORBIDE MONONITRATE ER 60 MG PO TB24
30.0000 mg | ORAL_TABLET | Freq: Every day | ORAL | Status: DC
  Administered 2022-09-23: 30 mg via ORAL
  Filled 2022-09-23: qty 1

## 2022-09-23 MED ORDER — SPIRONOLACTONE 12.5 MG HALF TABLET
12.5000 mg | ORAL_TABLET | Freq: Every day | ORAL | Status: DC
  Administered 2022-09-23 – 2022-09-29 (×7): 12.5 mg via ORAL
  Filled 2022-09-23 (×8): qty 1

## 2022-09-23 MED ORDER — DAPAGLIFLOZIN PROPANEDIOL 10 MG PO TABS
10.0000 mg | ORAL_TABLET | Freq: Every day | ORAL | Status: DC
  Administered 2022-09-23 – 2022-09-29 (×7): 10 mg via ORAL
  Filled 2022-09-23 (×8): qty 1

## 2022-09-23 MED ORDER — SACUBITRIL-VALSARTAN 24-26 MG PO TABS
1.0000 | ORAL_TABLET | Freq: Two times a day (BID) | ORAL | Status: DC
  Administered 2022-09-23 – 2022-09-29 (×13): 1 via ORAL
  Filled 2022-09-23 (×14): qty 1

## 2022-09-23 MED ORDER — FUROSEMIDE 20 MG PO TABS
20.0000 mg | ORAL_TABLET | Freq: Every day | ORAL | Status: DC
  Administered 2022-09-23 – 2022-09-27 (×5): 20 mg via ORAL
  Filled 2022-09-23 (×5): qty 1

## 2022-09-23 MED ORDER — LORAZEPAM 0.5 MG PO TABS
0.5000 mg | ORAL_TABLET | Freq: Once | ORAL | Status: AC
  Administered 2022-09-23: 0.5 mg via ORAL
  Filled 2022-09-23: qty 1

## 2022-09-23 MED ORDER — LORAZEPAM 0.5 MG PO TABS
0.5000 mg | ORAL_TABLET | Freq: Four times a day (QID) | ORAL | Status: AC | PRN
  Administered 2022-09-23: 0.5 mg via ORAL
  Filled 2022-09-23: qty 1

## 2022-09-23 NOTE — Progress Notes (Signed)
Rounding Note    Patient Name: Erin Ingram Date of Encounter: 09/23/2022  [facility] Cardiologist: Lance Muss, MD   Subjective   No CP; no dyspnea  Inpatient Medications    Scheduled Meds:  atorvastatin  10 mg Oral q1800   azithromycin  500 mg Oral Daily   budesonide (PULMICORT) nebulizer solution  0.25 mg Nebulization BID   Chlorhexidine Gluconate Cloth  6 each Topical Daily   clopidogrel  75 mg Oral Daily   cyanocobalamin  1,000 mcg Oral Daily   enoxaparin (LOVENOX) injection  40 mg Subcutaneous Q24H   furosemide  40 mg Intravenous BID   ipratropium-albuterol  3 mL Nebulization Q6H   isosorbide mononitrate  120 mg Oral Daily   levothyroxine  75 mcg Oral Q0600   losartan  25 mg Oral Daily   memantine  10 mg Oral BID   methylPREDNISolone (SOLU-MEDROL) injection  40 mg Intravenous Daily   metoCLOPramide  5 mg Oral BID   metoprolol succinate  25 mg Oral Daily   mirabegron ER  50 mg Oral Daily   mouth rinse  15 mL Mouth Rinse 4 times per day   pantoprazole  40 mg Oral BID   ranolazine  1,000 mg Oral BID   venlafaxine XR  150 mg Oral Daily   Continuous Infusions:  cefTRIAXone (ROCEPHIN)  IV Stopped (09/22/22 1021)   PRN Meds: acetaminophen **OR** acetaminophen, ipratropium-albuterol, nitroGLYCERIN, mouth rinse   Vital Signs    Vitals:   09/23/22 0335 09/23/22 0400 09/23/22 0500 09/23/22 0600  BP:  (!) 102/48  136/64  Pulse:  77  83  Resp:  18  18  Temp: (!) 97.3 F (36.3 C)     TempSrc: Oral     SpO2:  97%  100%  Weight:   85.5 kg   Height:        Intake/Output Summary (Last 24 hours) at 09/23/2022 0817 Last data filed at 09/22/2022 2335 Gross per 24 hour  Intake 146.83 ml  Output 1475 ml  Net -1328.17 ml       09/23/2022    5:00 AM 09/22/2022    4:50 AM 09/21/2022   12:22 PM  Last 3 Weights  Weight (lbs) 188 lb 7.9 oz 188 lb 7.9 oz 188 lb 15 oz  Weight (kg) 85.5 kg 85.5 kg 85.7 kg      Telemetry     Sinus- Personally  Reviewed   Physical Exam   GEN: NAD.  Obese Neck: supple Cardiac: RRR Respiratory: Diminished breath sounds bases with expiratory wheeze. No rhonchi GI: Soft, NT/ND MS: No edema Neuro:  Grossly intact Psych: Normal affect   Labs    High Sensitivity Troponin:   Recent Labs  Lab 09/21/22 0423 09/21/22 0627  TROPONINIHS 278* 270*      Chemistry Recent Labs  Lab 09/21/22 0423 09/21/22 0627 09/22/22 0744 09/23/22 0323  NA 139  --  136 138  K 3.9  --  3.3* 4.7  CL 107  --  101 103  CO2 20*  --  23 25  GLUCOSE 122*  --  100* 122*  BUN 30*  --  29* 40*  CREATININE 1.35*  --  1.33* 1.44*  CALCIUM 7.8*  --  7.9* 8.1*  MG  --  1.7  --  2.2  PROT 6.1*  --   --   --   ALBUMIN 3.0*  --   --   --   AST 15  --   --   --  ALT 13  --   --   --   ALKPHOS 37*  --   --   --   BILITOT 0.5  --   --   --   GFRNONAA 39*  --  40* 36*  ANIONGAP 12  --  12 10      Hematology Recent Labs  Lab 09/21/22 0423 09/22/22 0744 09/23/22 0323  WBC 16.0* 14.1* 16.9*  RBC 2.77* 2.90* 2.68*  HGB 9.0* 9.3* 8.6*  HCT 29.1* 30.7* 28.5*  MCV 105.1* 105.9* 106.3*  MCH 32.5 32.1 32.1  MCHC 30.9 30.3 30.2  RDW 13.8 14.0 13.5  PLT 310 299 292     BNP Recent Labs  Lab 09/21/22 0423  BNP 1,723.8*       Radiology    ECHOCARDIOGRAM COMPLETE  Result Date: 09/22/2022    ECHOCARDIOGRAM REPORT   Patient Name:   LYDIANA BIEGLER Date of Exam: 09/22/2022 Medical Rec #:  [id number]        Height:       58.0 in Accession #:    [id number]       Weight:       188.5 lb Date of Birth:  1940/10/08        BSA:          1.776 m Patient Age:    82 years         BP:           128/57 mmHg Patient Gender: F                HR:           52 bpm. Exam Location:  Inpatient Procedure: 2D Echo, Cardiac Doppler, Color Doppler and Intracardiac            Opacification Agent Indications:     CHF - Acute Diastolic  History:         Patient has prior history of Echocardiogram examinations, most                  recent  07/18/2018. CHF, CAD, Prior CABG, PAD, CKD and TIA,                  Signs/Symptoms:elevated troponin, Edema and Dyspnea; Risk                  Factors:Hypertension, Dyslipidemia, Sleep Apnea and Former                  Smoker.  Sonographer:     Wallie Char Referring Phys:  [id number] DAVID MANUEL ORTIZ Diagnosing Phys: Charlton Haws MD IMPRESSIONS  1. Global hypokinesis worse in septum and apex. Left ventricular ejection fraction, by estimation, is 30 to 35%. The left ventricle has moderately decreased function. The left ventricle demonstrates regional wall motion abnormalities (see scoring diagram/findings for description). The left ventricular internal cavity size was moderately dilated. Left ventricular diastolic parameters are indeterminate.  2. Right ventricular systolic function is normal. The right ventricular size is normal.  3. The mitral valve is degenerative. No evidence of mitral valve regurgitation. No evidence of mitral stenosis. Severe mitral annular calcification.  4. The aortic valve is tricuspid. There is moderate calcification of the aortic valve. There is moderate thickening of the aortic valve. Aortic valve regurgitation is not visualized. Mild aortic valve stenosis.  5. The inferior vena cava is normal in size with greater than 50% respiratory variability, suggesting right atrial pressure of 3 mmHg. FINDINGS  Left  Ventricle: Global hypokinesis worse in septum and apex. Left ventricular ejection fraction, by estimation, is 30 to 35%. The left ventricle has moderately decreased function. The left ventricle demonstrates regional wall motion abnormalities. Definity contrast agent was given IV to delineate the left ventricular endocardial borders. The left ventricular internal cavity size was moderately dilated. There is no left ventricular hypertrophy. Left ventricular diastolic parameters are indeterminate. Right Ventricle: The right ventricular size is normal. No increase in right ventricular wall  thickness. Right ventricular systolic function is normal. Left Atrium: Left atrial size was normal in size. Right Atrium: Right atrial size was normal in size. Pericardium: There is no evidence of pericardial effusion. Mitral Valve: The mitral valve is degenerative in appearance. There is mild thickening of the mitral valve leaflet(s). There is mild calcification of the mitral valve leaflet(s). Severe mitral annular calcification. No evidence of mitral valve regurgitation. No evidence of mitral valve stenosis. MV peak gradient, 7.2 mmHg. The mean mitral valve gradient is 4.0 mmHg. Tricuspid Valve: The tricuspid valve is normal in structure. Tricuspid valve regurgitation is mild . No evidence of tricuspid stenosis. Aortic Valve: The aortic valve is tricuspid. There is moderate calcification of the aortic valve. There is moderate thickening of the aortic valve. Aortic valve regurgitation is not visualized. Mild aortic stenosis is present. Aortic valve mean gradient measures 5.0 mmHg. Aortic valve peak gradient measures 8.0 mmHg. Aortic valve area, by VTI measures 1.73 cm. Pulmonic Valve: The pulmonic valve was normal in structure. Pulmonic valve regurgitation is trivial. No evidence of pulmonic stenosis. Aorta: The aortic root is normal in size and structure. Venous: The inferior vena cava is normal in size with greater than 50% respiratory variability, suggesting right atrial pressure of 3 mmHg. IAS/Shunts: No atrial level shunt detected by color flow Doppler.  LEFT VENTRICLE PLAX 2D LVIDd:         4.60 cm      Diastology LVIDs:         3.50 cm      LV e' medial:    4.10 cm/s LV PW:         0.90 cm      LV E/e' medial:  30.5 LV IVS:        0.80 cm      LV e' lateral:   6.19 cm/s LVOT diam:     1.70 cm      LV E/e' lateral: 20.2 LV SV:         48 LV SV Index:   27 LVOT Area:     2.27 cm  LV Volumes (MOD) LV vol d, MOD A2C: 126.0 ml LV vol d, MOD A4C: 126.0 ml LV vol s, MOD A2C: 86.1 ml LV vol s, MOD A4C: 87.1 ml LV  SV MOD A2C:     39.9 ml LV SV MOD A4C:     126.0 ml LV SV MOD BP:      40.1 ml RIGHT VENTRICLE             IVC RV Basal diam:  3.50 cm     IVC diam: 1.90 cm RV S prime:     10.10 cm/s TAPSE (M-mode): 1.0 cm LEFT ATRIUM             Index        RIGHT ATRIUM           Index LA diam:        3.20 cm 1.80 cm/m   RA Area:  16.20 cm LA Vol (A2C):   59.9 ml 33.73 ml/m  RA Volume:   43.40 ml  24.44 ml/m LA Vol (A4C):   63.9 ml 35.98 ml/m LA Biplane Vol: 61.9 ml 34.86 ml/m  AORTIC VALVE AV Area (Vmax):    1.69 cm AV Area (Vmean):   1.56 cm AV Area (VTI):     1.73 cm AV Vmax:           141.50 cm/s AV Vmean:          103.000 cm/s AV VTI:            0.278 m AV Peak Grad:      8.0 mmHg AV Mean Grad:      5.0 mmHg LVOT Vmax:         105.40 cm/s LVOT Vmean:        70.850 cm/s LVOT VTI:          0.212 m LVOT/AV VTI ratio: 0.76  AORTA Ao Root diam: 2.70 cm Ao Asc diam:  3.20 cm MITRAL VALVE                TRICUSPID VALVE MV Area (PHT): 4.68 cm     TR Peak grad:   56.0 mmHg MV Area VTI:   1.65 cm     TR Vmax:        374.00 cm/s MV Peak grad:  7.2 mmHg MV Mean grad:  4.0 mmHg     SHUNTS MV Vmax:       1.34 m/s     Systemic VTI:  0.21 m MV Vmean:      87.3 cm/s    Systemic Diam: 1.70 cm MV Decel Time: 162 msec MV E velocity: 125.00 cm/s MV A velocity: 133.00 cm/s MV E/A ratio:  0.94 Charlton Haws MD Electronically signed by Charlton Haws MD Signature Date/Time: 09/22/2022/11:24:09 AM    Final (Updated)      Patient Profile     82 year old female with past medical history of coronary artery disease status post coronary bypass and graft, chronic diastolic congestive heart failure, prior TIA, obstructive sleep apnea, hypertension, hyperlipidemia, chronic stage IIIa kidney disease, subclavian stenosis for evaluation of acute on chronic diastolic congestive heart failure. Last echocardiogram April 2020 showed normal LV function.  Echocardiogram this admission shows ejection fraction 30 to 35% worse in the septum and apex,  moderate left ventricular enlargement and mild aortic stenosis with mean gradient 5 mmHg and aortic valve area 1.73 cm.  Assessment & Plan    1 acute on chronic combined systolic/diastolic congestive heart failure-I/[id number].  Wt 85.5 kg.  Volume status has improved as has symptoms.  Renal function slightly worse.  Will change Lasix to 20 mg by mouth daily.  Add SGLT2 inhibitor to see if she tolerates (will begin Farxiga 10 mg daily).  Follow renal function.     2 cardiomyopathy-LV function reduced compared to previous.  However she has multiple comorbidities and is a no CODE BLUE.  I would favor medical therapy for now.  Discontinue losartan and begin Entresto 24/26 twice daily.  Decrease isosorbide to 30 mg daily.  Continue Toprol.  Add Farxiga 10 mg daily and spironolactone 12.5 mg daily.  Follow potassium and renal function closely.  Would plan to repeat echocardiogram in 3 months.  If LV function remains decreased could consider ischemia evaluation at that point.   3 coronary artery disease-continue Plavix and statin.  Discontinue ranolazine.   4 hyperlipidemia-continue statin.   5 hypertension-continue preadmission  blood pressure medications and follow.   6 chronic stage IIIa kidney disease-f renal function slightly worse today.  Follow closely with medication adjustments above.  7 hypokalemia-improved.  For questions or updates, please contact [facility] Please consult [email or web address] for contact info under        Signed, Olga Millers, MD  09/23/2022, 8:17 AM

## 2022-09-23 NOTE — Progress Notes (Signed)
    Patient Name: AMREEN ZAMOR           DOB: 04-Dec-1940  MRN: [id number]      Admission Date: 09/21/2022  Attending Provider: Leatha Gilding, MD  Primary Diagnosis: Acute on chronic diastolic heart failure [facility])   Level of care: Stepdown    CROSS COVER NOTE   Date of Service   09/23/2022   ADEJAH SKIFF, 82 y.o. female, was admitted on 09/21/2022 for Acute on chronic diastolic heart failure (HCC).    HPI/Events of Note   Witnessed Fall Fall occurred while patient was being transferred from chair to bed.  Patient landed on her buttocks. Patient denies any form of pain or injury.  Skin intact. Patient assisted back to bed without complications.ROM at baseline.     Interventions/ Plan   No further investigative work-up necessary at this time.  Nursing staff to continue monitoring for change in acute symptoms, mobility, and pain.         Anthoney Harada, DNP, ACNPC- AG [facility]

## 2022-09-23 NOTE — Evaluation (Signed)
Occupational Therapy Evaluation Patient Details Name: Erin Ingram MRN: [id number] DOB: 1940/07/04 Today's Date: 09/23/2022   History of Present Illness Patient is a 82 year old female who presented on 7/1 with shortness of breath, cough, and BLE edema. Patient was also found to be hypoxic in ED. 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   Clinical Impression   Patient is a 82 year old female who was admitted for above. Patient was living at ALF at rollator level prior to hospital admission. Currently, patient is +2 with STEDY to transfer from bed to recliner in room with increased SOB. Patient was noted to have some poor wave forms with transfer and movement with patients O2 100% sitting in recliner. . Patient was noted to have decreased functional activity tolernace, decreased ROM, decreased BUE strength, decreased endurance, decreased sitting balance, decreased standing balanced, decreased safety awareness, and decreased knowledge of AE/AD impacting participation in ADLs. Patient would continue to benefit from skilled OT services at this time while admitted and after d/c to address noted deficits in order to improve overall safety and independence in ADLs.        Recommendations for follow up therapy are one component of a multi-disciplinary discharge planning process, led by the attending physician.  Recommendations may be updated based on patient status, additional functional criteria and insurance authorization.   Assistance Recommended at Discharge Frequent or constant Supervision/Assistance  Patient can return home with the following A lot of help with bathing/dressing/bathroom;Assistance with cooking/housework;Two people to help with walking and/or transfers;Direct supervision/assist for medications management;Assist for  transportation;Help with stairs or ramp for entrance;Direct supervision/assist for financial management    Functional Status Assessment  Patient has had a recent decline in their functional status and demonstrates the ability to make significant improvements in function in a reasonable and predictable amount of time.  Equipment Recommendations  None recommended by OT       Precautions / Restrictions Restrictions Weight Bearing Restrictions: No      Mobility Bed Mobility Overal bed mobility: Needs Assistance Bed Mobility: Supine to Sit     Supine to sit: +2 for safety/equipment, +2 for physical assistance, Max assist     General bed mobility comments: with increased time. patient reported having pain with all touch but unable to identify a level of pain.        Balance Overall balance assessment: Needs assistance Sitting-balance support: Feet supported, Bilateral upper extremity supported Sitting balance-Leahy Scale: Fair Sitting balance - Comments: initally posterior lean but able to progress to min guard   Standing balance support: Reliant on assistive device for balance, Bilateral upper extremity supported, During functional activity Standing balance-Leahy Scale: Poor         ADL either performed or assessed with clinical judgement   ADL Overall ADL's : Needs assistance/impaired Eating/Feeding: Modified independent;Sitting   Grooming: Minimal assistance;Sitting   Upper Body Bathing: Minimal assistance;Sitting   Lower Body Bathing: Maximal assistance;Sitting/lateral leans;Sit to/from stand   Upper Body Dressing : Minimal assistance;Sitting   Lower Body Dressing: Sitting/lateral leans;Sit to/from stand;Maximal assistance   Toilet Transfer: +2 for physical assistance;+2 for safety/equipment;Minimal assistance Toilet Transfer Details (indicate cue type and reason): with STEDY to transfer from bed to recliner and back. Toileting- Clothing Manipulation and Hygiene:  Maximal assistance;Sit to/from stand               Vision Baseline Vision/History:  1 Wears glasses              Pertinent Vitals/Pain Pain Assessment Pain Assessment: Faces Faces Pain Scale: Hurts even more Pain Location: all over Pain Descriptors / Indicators: Grimacing Pain Intervention(s): Limited activity within patient's tolerance, Monitored during session, Repositioned     Hand Dominance     Extremity/Trunk Assessment Upper Extremity Assessment Upper Extremity Assessment: Overall WFL for tasks assessed;RUE deficits/detail RUE Deficits / Details: reported history of fracture but still able to range to just above 90 degrees at shoulder level.   Lower Extremity Assessment Lower Extremity Assessment: Defer to PT evaluation   Cervical / Trunk Assessment Cervical / Trunk Assessment: Other exceptions Cervical / Trunk Exceptions: body habitus   Communication Communication Communication: No difficulties   Cognition Arousal/Alertness: Awake/alert Behavior During Therapy: WFL for tasks assessed/performed Overall Cognitive Status: Within Functional Limits for tasks assessed             General Comments  130/56 mmhg MAP 84, 99% 4L/min, HR 89 bpm            Home Living Family/patient expects to be discharged to:: Assisted living         Home Equipment: Rollator (4 wheels)   Additional Comments: friends home      Prior Functioning/Environment Prior Level of Function : Independent/Modified Independent                        OT Problem List: Decreased activity tolerance;Impaired balance (sitting and/or standing);Decreased coordination;Decreased safety awareness;Decreased knowledge of precautions;Decreased knowledge of use of DME or AE;Pain      OT Treatment/Interventions: Self-care/ADL training;Energy conservation;Therapeutic exercise;DME and/or AE instruction;Therapeutic activities;Patient/family education;Balance training    OT Goals(Current  goals can be found in the care plan section) Acute Rehab OT Goals Patient Stated Goal: to get back home OT Goal Formulation: With patient Time For Goal Achievement: 10/07/22 Potential to Achieve Goals: Fair  OT Frequency: Min 1X/week    Co-evaluation PT/OT/SLP Co-Evaluation/Treatment: Yes Reason for Co-Treatment: For patient/therapist safety;To address functional/ADL transfers PT goals addressed during session: Mobility/safety with mobility OT goals addressed during session: ADL's and self-care      AM-PAC OT "6 Clicks" Daily Activity     Outcome Measure Help from another person eating meals?: A Little Help from another person taking care of personal grooming?: A Little Help from another person toileting, which includes using toliet, bedpan, or urinal?: A Lot Help from another person bathing (including washing, rinsing, drying)?: A Lot Help from another person to put on and taking off regular upper body clothing?: A Lot Help from another person to put on and taking off regular lower body clothing?: A Lot 6 Click Score: 14   End of Session Equipment Utilized During Treatment: Gait belt;Other (comment) (STEDY) Nurse Communication: Other (comment) (ok to participate in session, updated on transfer status)  Activity Tolerance: Patient tolerated treatment well Patient left: in chair;with call bell/phone within reach;with chair alarm set  OT Visit Diagnosis: Unsteadiness on feet (R26.81);Other abnormalities of gait and mobility (R26.89);Muscle weakness (generalized) (M62.81);Pain                Time: 1456-1520 OT Time Calculation (min): 24 min Charges:  OT General Charges $OT Visit: 1 Visit OT Evaluation $OT Eval Moderate Complexity: 1 Mod  Ellon Marasco OTR/L, MS Acute Rehabilitation Department Office# [phone]   Selinda Flavin 09/23/2022, 4:05 PM

## 2022-09-23 NOTE — Progress Notes (Signed)
PROGRESS NOTE  Erin Ingram ZOX:[id number] DOB: May 24, 1940 DOA: 09/21/2022 PCP: Mast, Man X, NP   LOS: 2 days   Brief Narrative / Interim history: 82 year old with past medical history significant for microcytic anemia, osteoarthritis, stage III CKD, depression, GERD, hiatal hernia, hyperlipidemia, hypothyroidism, obesity, OSA on CPAP, polymyalgia rheumatica, history of multiple episode of pneumonia, subclavian artery stenosis, TIA, CAD, CABG chronic diastolic heart failure who [facility] with progressive worsening shortness of breath, lower extremity edema, orthopnea cough, and found to be hypoxic oxygen as low as 86%.  Symptoms started a week ago. Patient admitted for acute hypoxic respiratory failure in the setting of acute diastolic heart failure exacerbation.  She was initially started on BiPAP .  Subjective / 24h Interval events: Appears comfortable on BiPAP this morning  Assesement and Plan: Principal Problem:   Acute on chronic diastolic heart failure (HCC) Active Problems:   Vitamin B12 deficiency anemia   Essential hypertension   PAD (peripheral artery disease) (HCC)   Hyperlipidemia LDL goal <70   CAD (coronary artery disease)   GERD (gastroesophageal reflux disease)   Hypothyroidism   Memory loss   Elevated troponin   Principal problem Acute on chronic combined systolic and diastolic heart failure exacerbation -Patient presented with worsening shortness of breath, lower extremity edema, chest x-ray with finding consistent with fluid overload and pery hilar interstitial and groundglass edema.  Developing small pleural effusion.  BNP at 1700, elevation of troponin -Cardiology consulted appreciate input -2D echo shows worsening LV function, cardiology recommending medical management.  Started Entresto, switch diuretics to oral today due to slight elevation in creatinine  Active problems  Acute Hypoxic Respiratory Failure, In  setting HF and presume COPD exacerbation. PNA - Reports history of smoking.  Has been started on nebulizers, IV steroids, continue   CAD, elevation of troponin -Elevation of troponin in the setting of heart failure exacerbation.  Medical management per cardiology   Leukocytosis -Chest x-ray: With patchy opacities in the bases which may be due to additional edema or superimposed pneumonia.  On antibiotics   B12 deficiency anemia - Continue with B12 supplement.   Essential hypertension - Continue regimen as below   PAD - Continue with Lipitor and Plavix.   Hyperlipidemia - Continue with Lipitor   GERD - Continue with PPI   Hypothyroidism - Continue with Synthroid   Memory loss, dementia - Continue with Namenda   Polymyalgia Rheumatica - On Chronic prednisone 5 mg daily. Holding while on IV steroid.    Hypokalemia, Hypomagnesemia - replete and monitor   Scheduled Meds:  atorvastatin  10 mg Oral q1800   azithromycin  500 mg Oral Daily   budesonide (PULMICORT) nebulizer solution  0.25 mg Nebulization BID   Chlorhexidine Gluconate Cloth  6 each Topical Daily   clopidogrel  75 mg Oral Daily   cyanocobalamin  1,000 mcg Oral Daily   dapagliflozin propanediol  10 mg Oral Daily   enoxaparin (LOVENOX) injection  30 mg Subcutaneous Q24H   furosemide  20 mg Oral Daily   ipratropium-albuterol  3 mL Nebulization Q6H   isosorbide mononitrate  30 mg Oral Daily   levothyroxine  75 mcg Oral Q0600   memantine  10 mg Oral BID   methylPREDNISolone (SOLU-MEDROL) injection  40 mg Intravenous Daily   metoCLOPramide  5 mg Oral BID   metoprolol succinate  25 mg Oral Daily   mirabegron ER  50 mg Oral Daily   mouth rinse  15 mL  Mouth Rinse 4 times per day   pantoprazole  40 mg Oral BID   sacubitril-valsartan  1 tablet Oral BID   spironolactone  12.5 mg Oral Daily   venlafaxine XR  150 mg Oral Daily   Continuous Infusions:  cefTRIAXone (ROCEPHIN)  IV 2 g (09/23/22 0956)   PRN Meds:.acetaminophen  **OR** acetaminophen, ipratropium-albuterol, nitroGLYCERIN, mouth rinse  Current Outpatient Medications  Medication Instructions   acetaminophen (TYLENOL) 1,000 mg, Oral, 2 times daily PRN   alum & mag hydroxide-simeth (MAALOX PLUS) 400-400-40 MG/5ML suspension 10 mLs, Oral, As needed   atorvastatin (LIPITOR) 10 MG tablet TAKE 1 TABLET ONCE DAILY.   Calcium-Phosphorus-Vitamin D (CALCIUM/VITAMIN D3/ADULT GUMMY) 250-100-500 MG-MG-UNIT CHEW 1 Piece of gum, Oral, 2 times daily   clopidogrel (PLAVIX) 75 MG tablet TAKE 1 TABLET ONCE DAILY.   COENZYME Q-10 PO 10 mg, Oral, Daily at bedtime   cyanocobalamin 1,000 mcg, Oral, Daily   diclofenac Sodium (VOLTAREN) 4 g, Topical, 4 times daily   docusate sodium (COLACE) 100 mg, Oral, Daily PRN   fluticasone (FLONASE) 50 MCG/ACT nasal spray 1 spray, Each Nare, Daily PRN   furosemide (LASIX) 20 mg, Oral, Daily   isosorbide mononitrate (IMDUR) 120 mg, Oral, Daily   ketoconazole (NIZORAL) 2 % shampoo 1 Application, Topical, 2 times weekly, Tuesday and Friday   levocetirizine (XYZAL) 5 MG tablet TAKE 1 TABLET ONCE DAILY IN THE EVENING.   levothyroxine (SYNTHROID) 75 MCG tablet TAKE 1 TABLET ONCE DAILY ON EMPTY STOMACH.   losartan (COZAAR) 12.5 mg, Oral, Daily   melatonin 1 mg, Oral, Daily at bedtime   memantine (NAMENDA) 10 mg, Oral, Nightly   Menthol, Topical Analgesic, (BIOFREEZE) 10 % LIQD 1 Application, Apply externally, As needed   methocarbamol (ROBAXIN) 750 mg, Oral, Every 6 hours PRN   metoCLOPramide (REGLAN) 5 mg, Oral, 2 times daily   metoprolol succinate (TOPROL-XL) 25 mg, Oral, Daily, Take with or immediately following a meal.    miconazole (MICOTIN) 2 % powder Topical, 2 times daily,  Apply to groin and pannus topically   MYRBETRIQ 50 MG TB24 tablet TAKE 1 TABLET BY MOUTH DAILY.   nitroGLYCERIN (NITROSTAT) 0.4 mg, Sublingual, Every 5 min PRN, X 3 doses    pantoprazole (PROTONIX) 40 mg, Oral, 2 times daily   potassium chloride (MICRO-K) 10 MEQ  CR capsule 20 mEq, Oral, 2 times daily   predniSONE (DELTASONE) 5 mg, Oral, Daily   PSYLLIUM HUSK PO 0.4 g, Oral, Daily, May keep at bedside    ranolazine (RANEXA) 1000 MG SR tablet TAKE 1 TABLET BY MOUTH TWICE DAILY.   venlafaxine (EFFEXOR) 150 mg, Oral, Daily   Vitamin D 2,000 Units, Oral, Daily   zinc oxide 20 % ointment 1 application , Topical, As needed    Diet Orders (From admission, onward)     Start     Ordered   09/21/22 0619  Diet Heart Room service appropriate? Yes; Fluid consistency: Thin  Diet effective now       Question Answer Comment  Room service appropriate? Yes   Fluid consistency: Thin      09/21/22 0618            DVT prophylaxis: enoxaparin (LOVENOX) injection 30 mg Start: 09/23/22 1000 SCDs Start: 09/21/22 1610   Lab Results  Component Value Date   PLT 292 09/23/2022      Code Status: DNR  Family Communication: No family at bedside  Status is: Inpatient Remains inpatient appropriate because: severity of illness  Level of  care: Stepdown  Consultants:  Cardiology   Objective: Vitals:   09/23/22 0705 09/23/22 0800 09/23/22 0850 09/23/22 0852  BP:  (!) 131/55    Pulse:  77    Resp:  19    Temp: (!) 97.5 F (36.4 C)     TempSrc: Axillary     SpO2:  98% 99% 97%  Weight:      Height:        Intake/Output Summary (Last 24 hours) at 09/23/2022 1007 Last data filed at 09/22/2022 2335 Gross per 24 hour  Intake 146.83 ml  Output 1475 ml  Net -1328.17 ml   Wt Readings from Last 3 Encounters:  09/23/22 85.5 kg  09/09/22 86.3 kg  09/03/22 86.2 kg    Examination:  Constitutional: NAD Eyes: no scleral icterus ENMT: Mucous membranes are moist.  Neck: normal, supple Respiratory: Breathing with the BiPAP mostly clear on anterior auscultation Cardiovascular: Regular rate and rhythm, no murmurs / rubs / gallops. No LE edema.  Abdomen: non distended, no tenderness. Bowel sounds positive.  Musculoskeletal: no clubbing / cyanosis.   Data  Reviewed: I have independently reviewed following labs and imaging studies   CBC Recent Labs  Lab 09/21/22 0423 09/22/22 0744 09/23/22 0323  WBC 16.0* 14.1* 16.9*  HGB 9.0* 9.3* 8.6*  HCT 29.1* 30.7* 28.5*  PLT 310 299 292  MCV 105.1* 105.9* 106.3*  MCH 32.5 32.1 32.1  MCHC 30.9 30.3 30.2  RDW 13.8 14.0 13.5  LYMPHSABS 1.7  --   --   MONOABS 1.2*  --   --   EOSABS 0.1  --   --   BASOSABS 0.0  --   --     Recent Labs  Lab 09/21/22 0423 09/21/22 0627 09/22/22 0744 09/22/22 1001 09/23/22 0323  NA 139  --  136  --  138  K 3.9  --  3.3*  --  4.7  CL 107  --  101  --  103  CO2 20*  --  23  --  25  GLUCOSE 122*  --  100*  --  122*  BUN 30*  --  29*  --  40*  CREATININE 1.35*  --  1.33*  --  1.44*  CALCIUM 7.8*  --  7.9*  --  8.1*  AST 15  --   --   --   --   ALT 13  --   --   --   --   ALKPHOS 37*  --   --   --   --   BILITOT 0.5  --   --   --   --   ALBUMIN 3.0*  --   --   --   --   MG  --  1.7  --   --  2.2  CRP  --   --   --  26.7*  --   PROCALCITON  --   --  0.11  --   --   BNP 1,723.8*  --   --   --   --     ------------------------------------------------------------------------------------------------------------------ No results for input(s): "CHOL", "HDL", "LDLCALC", "TRIG", "CHOLHDL", "LDLDIRECT" in the last 72 hours.  Lab Results  Component Value Date   HGBA1C 5.1 02/14/2019   ------------------------------------------------------------------------------------------------------------------ No results for input(s): "TSH", "T4TOTAL", "T3FREE", "THYROIDAB" in the last 72 hours.  Invalid input(s): "FREET3"  Cardiac Enzymes No results for input(s): "CKMB", "TROPONINI", "MYOGLOBIN" in the last 168 hours.  Invalid input(s): "CK" ------------------------------------------------------------------------------------------------------------------  Component Value Date/Time   BNP 1,723.8 (H) 09/21/2022 0423    CBG: No results for input(s): "GLUCAP" in  the last 168 hours.  Recent Results (from the past 240 hour(s))  Resp panel by RT-PCR (RSV, Flu A&B, Covid) Anterior Nasal Swab     Status: None   Collection Time: 09/21/22  4:23 AM   Specimen: Anterior Nasal Swab  Result Value Ref Range Status   SARS Coronavirus 2 by RT PCR NEGATIVE NEGATIVE Final    Comment: (NOTE) SARS-CoV-2 target nucleic acids are NOT DETECTED.  The SARS-CoV-2 RNA is generally detectable in upper respiratory specimens during the acute phase of infection. The lowest concentration of SARS-CoV-2 viral copies this assay can detect is 138 copies/mL. A negative result does not preclude SARS-Cov-2 infection and should not be used as the sole basis for treatment or other patient management decisions. A negative result may occur with  improper specimen collection/handling, submission of specimen other than nasopharyngeal swab, presence of viral mutation(s) within the areas targeted by this assay, and inadequate number of viral copies(<138 copies/mL). A negative result must be combined with clinical observations, patient history, and epidemiological information. The expected result is Negative.  Fact Sheet for Patients:  BloggerCourse.com  Fact Sheet for Healthcare Providers:  SeriousBroker.it  This test is no t yet approved or cleared by the [place] FDA and  has been authorized for detection and/or diagnosis of SARS-CoV-2 by FDA under an Emergency Use Authorization (EUA). This EUA will remain  in effect (meaning this test can be used) for the duration of the COVID-19 declaration under Section 564(b)(1) of the Act, 21 U.S.C.section 360bbb-3(b)(1), unless the authorization is terminated  or revoked sooner.       Influenza A by PCR NEGATIVE NEGATIVE Final   Influenza B by PCR NEGATIVE NEGATIVE Final    Comment: (NOTE) The Xpert Xpress SARS-CoV-2/FLU/RSV plus assay is intended as an aid in the diagnosis of  influenza from Nasopharyngeal swab specimens and should not be used as a sole basis for treatment. Nasal washings and aspirates are unacceptable for Xpert Xpress SARS-CoV-2/FLU/RSV testing.  Fact Sheet for Patients: BloggerCourse.com  Fact Sheet for Healthcare Providers: SeriousBroker.it  This test is not yet approved or cleared by the [place] FDA and has been authorized for detection and/or diagnosis of SARS-CoV-2 by FDA under an Emergency Use Authorization (EUA). This EUA will remain in effect (meaning this test can be used) for the duration of the COVID-19 declaration under Section 564(b)(1) of the Act, 21 U.S.C. section 360bbb-3(b)(1), unless the authorization is terminated or revoked.     Resp Syncytial Virus by PCR NEGATIVE NEGATIVE Final    Comment: (NOTE) Fact Sheet for Patients: BloggerCourse.com  Fact Sheet for Healthcare Providers: SeriousBroker.it  This test is not yet approved or cleared by the [place] FDA and has been authorized for detection and/or diagnosis of SARS-CoV-2 by FDA under an Emergency Use Authorization (EUA). This EUA will remain in effect (meaning this test can be used) for the duration of the COVID-19 declaration under Section 564(b)(1) of the Act, 21 U.S.C. section 360bbb-3(b)(1), unless the authorization is terminated or revoked.  Performed at [facility], [address] [place], [place] [address]   MRSA Next Gen by PCR, Nasal     Status: None   Collection Time: 09/21/22 12:24 PM   Specimen: Nasal Mucosa; Nasal Swab  Result Value Ref Range Status   MRSA by PCR Next Gen NOT DETECTED NOT DETECTED Final  Comment: (NOTE) The GeneXpert MRSA Assay (FDA approved for NASAL specimens only), is one component of a comprehensive MRSA colonization surveillance program. It is not intended to diagnose MRSA infection nor to  guide or monitor treatment for MRSA infections. Test performance is not FDA approved in patients less than 5 years old. Performed at [facility], [address] [place], [place] [address]      Radiology Studies: ECHOCARDIOGRAM COMPLETE  Result Date: 09/22/2022    ECHOCARDIOGRAM REPORT   Patient Name:   PRABHLEEN TISH Date of Exam: 09/22/2022 Medical Rec #:  [id number]        Height:       58.0 in Accession #:    [id number]       Weight:       188.5 lb Date of Birth:  05/15/1940        BSA:          1.776 m Patient Age:    82 years         BP:           128/57 mmHg Patient Gender: F                HR:           52 bpm. Exam Location:  Inpatient Procedure: 2D Echo, Cardiac Doppler, Color Doppler and Intracardiac            Opacification Agent Indications:     CHF - Acute Diastolic  History:         Patient has prior history of Echocardiogram examinations, most                  recent 07/18/2018. CHF, CAD, Prior CABG, PAD, CKD and TIA,                  Signs/Symptoms:elevated troponin, Edema and Dyspnea; Risk                  Factors:Hypertension, Dyslipidemia, Sleep Apnea and Former                  Smoker.  Sonographer:     Wallie Char Referring Phys:  [id number] DAVID MANUEL ORTIZ Diagnosing Phys: Charlton Haws MD IMPRESSIONS  1. Global hypokinesis worse in septum and apex. Left ventricular ejection fraction, by estimation, is 30 to 35%. The left ventricle has moderately decreased function. The left ventricle demonstrates regional wall motion abnormalities (see scoring diagram/findings for description). The left ventricular internal cavity size was moderately dilated. Left ventricular diastolic parameters are indeterminate.  2. Right ventricular systolic function is normal. The right ventricular size is normal.  3. The mitral valve is degenerative. No evidence of mitral valve regurgitation. No evidence of mitral stenosis. Severe mitral annular calcification.  4. The aortic valve is  tricuspid. There is moderate calcification of the aortic valve. There is moderate thickening of the aortic valve. Aortic valve regurgitation is not visualized. Mild aortic valve stenosis.  5. The inferior vena cava is normal in size with greater than 50% respiratory variability, suggesting right atrial pressure of 3 mmHg. FINDINGS  Left Ventricle: Global hypokinesis worse in septum and apex. Left ventricular ejection fraction, by estimation, is 30 to 35%. The left ventricle has moderately decreased function. The left ventricle demonstrates regional wall motion abnormalities. Definity contrast agent was given IV to delineate the left ventricular endocardial borders. The left ventricular internal cavity size was moderately dilated. There is no left ventricular hypertrophy. Left ventricular  diastolic parameters are indeterminate. Right Ventricle: The right ventricular size is normal. No increase in right ventricular wall thickness. Right ventricular systolic function is normal. Left Atrium: Left atrial size was normal in size. Right Atrium: Right atrial size was normal in size. Pericardium: There is no evidence of pericardial effusion. Mitral Valve: The mitral valve is degenerative in appearance. There is mild thickening of the mitral valve leaflet(s). There is mild calcification of the mitral valve leaflet(s). Severe mitral annular calcification. No evidence of mitral valve regurgitation. No evidence of mitral valve stenosis. MV peak gradient, 7.2 mmHg. The mean mitral valve gradient is 4.0 mmHg. Tricuspid Valve: The tricuspid valve is normal in structure. Tricuspid valve regurgitation is mild . No evidence of tricuspid stenosis. Aortic Valve: The aortic valve is tricuspid. There is moderate calcification of the aortic valve. There is moderate thickening of the aortic valve. Aortic valve regurgitation is not visualized. Mild aortic stenosis is present. Aortic valve mean gradient measures 5.0 mmHg. Aortic valve peak  gradient measures 8.0 mmHg. Aortic valve area, by VTI measures 1.73 cm. Pulmonic Valve: The pulmonic valve was normal in structure. Pulmonic valve regurgitation is trivial. No evidence of pulmonic stenosis. Aorta: The aortic root is normal in size and structure. Venous: The inferior vena cava is normal in size with greater than 50% respiratory variability, suggesting right atrial pressure of 3 mmHg. IAS/Shunts: No atrial level shunt detected by color flow Doppler.  LEFT VENTRICLE PLAX 2D LVIDd:         4.60 cm      Diastology LVIDs:         3.50 cm      LV e' medial:    4.10 cm/s LV PW:         0.90 cm      LV E/e' medial:  30.5 LV IVS:        0.80 cm      LV e' lateral:   6.19 cm/s LVOT diam:     1.70 cm      LV E/e' lateral: 20.2 LV SV:         48 LV SV Index:   27 LVOT Area:     2.27 cm  LV Volumes (MOD) LV vol d, MOD A2C: 126.0 ml LV vol d, MOD A4C: 126.0 ml LV vol s, MOD A2C: 86.1 ml LV vol s, MOD A4C: 87.1 ml LV SV MOD A2C:     39.9 ml LV SV MOD A4C:     126.0 ml LV SV MOD BP:      40.1 ml RIGHT VENTRICLE             IVC RV Basal diam:  3.50 cm     IVC diam: 1.90 cm RV S prime:     10.10 cm/s TAPSE (M-mode): 1.0 cm LEFT ATRIUM             Index        RIGHT ATRIUM           Index LA diam:        3.20 cm 1.80 cm/m   RA Area:     16.20 cm LA Vol (A2C):   59.9 ml 33.73 ml/m  RA Volume:   43.40 ml  24.44 ml/m LA Vol (A4C):   63.9 ml 35.98 ml/m LA Biplane Vol: 61.9 ml 34.86 ml/m  AORTIC VALVE AV Area (Vmax):    1.69 cm AV Area (Vmean):   1.56 cm AV Area (VTI):  1.73 cm AV Vmax:           141.50 cm/s AV Vmean:          103.000 cm/s AV VTI:            0.278 m AV Peak Grad:      8.0 mmHg AV Mean Grad:      5.0 mmHg LVOT Vmax:         105.40 cm/s LVOT Vmean:        70.850 cm/s LVOT VTI:          0.212 m LVOT/AV VTI ratio: 0.76  AORTA Ao Root diam: 2.70 cm Ao Asc diam:  3.20 cm MITRAL VALVE                TRICUSPID VALVE MV Area (PHT): 4.68 cm     TR Peak grad:   56.0 mmHg MV Area VTI:   1.65 cm     TR  Vmax:        374.00 cm/s MV Peak grad:  7.2 mmHg MV Mean grad:  4.0 mmHg     SHUNTS MV Vmax:       1.34 m/s     Systemic VTI:  0.21 m MV Vmean:      87.3 cm/s    Systemic Diam: 1.70 cm MV Decel Time: 162 msec MV E velocity: 125.00 cm/s MV A velocity: 133.00 cm/s MV E/A ratio:  0.94 Charlton Haws MD Electronically signed by Charlton Haws MD Signature Date/Time: 09/22/2022/11:24:09 AM    Final (Updated)      Pamella Pert, MD, PhD [facility]  Between 7 am - 7 pm I am available, please contact me via Amion (for emergencies) or Securechat (non urgent messages)  Between 7 pm - 7 am I am not available, please contact night coverage MD/APP via Amion

## 2022-09-23 NOTE — Plan of Care (Signed)

## 2022-09-23 NOTE — Evaluation (Signed)
Physical Therapy Evaluation Patient Details Name: Erin Ingram MRN: [id number] DOB: 1940/04/27 Today's Date: 09/23/2022  History of Present Illness  Patient is a 82 year old female who presented on 7/1 with shortness of breath, cough, and BLE edema. Patient was also found to be hypoxic in ED. 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  Clinical Impression  Pt admitted with above diagnosis.  Pt currently with functional limitations due to the deficits listed below (see PT Problem List). Pt will benefit from acute skilled PT to increase their independence and safety with mobility to allow discharge.     Patient reports resides in independent living. Has been using WC  prior to admission. Patient   able to stand, used lift equipment for safe transfer due to body stature and patient complains of pain and weakness.   Patient maintained on 4 L [place], Spo2 briefly dropping into 80's, at rest 100%.   Continue PT as tolerated.      Assistance Recommended at Discharge Frequent or constant Supervision/Assistance  If plan is discharge home, recommend the following:  Can travel by private vehicle  Two people to help with walking and/or transfers;A little help with bathing/dressing/bathroom;A lot of help with bathing/dressing/bathroom;Help with stairs or ramp for entrance;Assist for transportation   No    Equipment Recommendations None recommended by PT  Recommendations for Other Services       Functional Status Assessment Patient has had a recent decline in their functional status and demonstrates the ability to make significant improvements in function in a reasonable and predictable amount of time.     Precautions / Restrictions Restrictions Weight Bearing Restrictions: No      Mobility  Bed Mobility Overal bed mobility:  Needs Assistance Bed Mobility: Supine to Sit     Supine to sit: +2 for safety/equipment, +2 for physical assistance, Max assist     General bed mobility comments: with increased time. patient reported having pain with all touch but unable to identify a level of pain.    Transfers Overall transfer level: Needs assistance   Transfers: Sit to/from Stand Sit to Stand: +2 physical assistance, +2 safety/equipment, Mod assist           General transfer comment: stood  from bed at [facility]  , transfer to recliner then stood again, assist to decelerate sitting,    Ambulation/Gait                  Stairs            Wheelchair Mobility     Tilt Bed    Modified Rankin (Stroke Patients Only)       Balance   Sitting-balance support: Feet supported, Bilateral upper extremity supported   Sitting balance - Comments: initally posterior lean but able to progress to min guard   Standing balance support: Reliant on assistive device for balance, Bilateral upper extremity supported, During functional activity                                 Pertinent Vitals/Pain Pain Assessment Faces Pain Scale: Hurts even more Pain Location: all over Pain Descriptors / Indicators: Grimacing Pain Intervention(s): Limited activity within patient's tolerance, Monitored during session    Home Living Family/patient expects to be discharged to:: Assisted living  Home Equipment: Rollator (4 wheels) Additional Comments: [facility]    Prior Function Prior Level of Function : Independent/Modified Independent             Mobility Comments: last week has used WC       Hand Dominance        Extremity/Trunk Assessment   Upper Extremity Assessment Upper Extremity Assessment: Defer to OT evaluation RUE Deficits / Details: reported history of fracture but still able to range to just above 90 degrees at shoulder level.    Lower Extremity  Assessment Lower Extremity Assessment: Generalized weakness    Cervical / Trunk Assessment Cervical / Trunk Assessment: Other exceptions Cervical / Trunk Exceptions: body habitus  Communication   Communication: No difficulties  Cognition Arousal/Alertness: Awake/alert Behavior During Therapy: WFL for tasks assessed/performed Overall Cognitive Status: Within Functional Limits for tasks assessed                                          General Comments General comments (skin integrity, edema, etc.): 130/56 mmhg MAP 84, 99% 4L/min, HR 89 bpm    Exercises     Assessment/Plan    PT Assessment Patient needs continued PT services  PT Problem List Decreased strength;Decreased mobility;Decreased safety awareness;Decreased activity tolerance;Cardiopulmonary status limiting activity;Decreased balance       PT Treatment Interventions DME instruction;Therapeutic activities;Gait training;Therapeutic exercise;Patient/family education;Functional mobility training    PT Goals (Current goals can be found in the Care Plan section)  Acute Rehab PT Goals Patient Stated Goal: to go home PT Goal Formulation: With patient Time For Goal Achievement: 10/07/22 Potential to Achieve Goals: Fair    Frequency Min 1X/week     Co-evaluation PT/OT/SLP Co-Evaluation/Treatment: Yes Reason for Co-Treatment: For patient/therapist safety;To address functional/ADL transfers PT goals addressed during session: Mobility/safety with mobility OT goals addressed during session: ADL's and self-care       AM-PAC PT "6 Clicks" Mobility  Outcome Measure Help needed turning from your back to your side while in a flat bed without using bedrails?: A Lot Help needed moving from lying on your back to sitting on the side of a flat bed without using bedrails?: A Lot Help needed moving to and from a bed to a chair (including a wheelchair)?: A Lot Help needed standing up from a chair using your arms  (e.g., wheelchair or bedside chair)?: A Lot Help needed to walk in hospital room?: Total Help needed climbing 3-5 steps with a railing? : Total 6 Click Score: 6    End of Session Equipment Utilized During Treatment: Gait belt Activity Tolerance: Patient tolerated treatment well Patient left: in chair;with call bell/phone within reach;with chair alarm set Nurse Communication: Mobility status;Need for lift equipment PT Visit Diagnosis: Unsteadiness on feet (R26.81);Difficulty in walking, not elsewhere classified (R26.2)    Time: 9147-8295 PT Time Calculation (min) (ACUTE ONLY): 23 min   Charges:   PT Evaluation $PT Eval Low Complexity: 1 Low   PT General Charges $$ ACUTE PT VISIT: 1 Visit         Blanchard Kelch PT Acute Rehabilitation Services Office [phone] Weekend [phone]   Rada Hay 09/23/2022, 4:16 PM

## 2022-09-24 DIAGNOSIS — I5033 Acute on chronic diastolic (congestive) heart failure: Secondary | ICD-10-CM | POA: Diagnosis not present

## 2022-09-24 LAB — COMPREHENSIVE METABOLIC PANEL
ALT: 12 U/L (ref 0–44)
AST: 15 U/L (ref 15–41)
Albumin: 2.7 g/dL — ABNORMAL LOW (ref 3.5–5.0)
Alkaline Phosphatase: 42 U/L (ref 38–126)
Anion gap: 10 (ref 5–15)
BUN: 46 mg/dL — ABNORMAL HIGH (ref 8–23)
CO2: 22 mmol/L (ref 22–32)
Calcium: 8.1 mg/dL — ABNORMAL LOW (ref 8.9–10.3)
Chloride: 105 mmol/L (ref 98–111)
Creatinine, Ser: 1.33 mg/dL — ABNORMAL HIGH (ref 0.44–1.00)
GFR, Estimated: 40 mL/min — ABNORMAL LOW (ref >60)
Glucose, Bld: 133 mg/dL — ABNORMAL HIGH (ref 70–99)
Potassium: 3.5 mmol/L (ref 3.5–5.1)
Sodium: 137 mmol/L (ref 135–145)
Total Bilirubin: 0.5 mg/dL (ref 0.3–1.2)
Total Protein: 5.8 g/dL — ABNORMAL LOW (ref 6.5–8.1)

## 2022-09-24 LAB — CBC
HCT: 31.4 % — ABNORMAL LOW (ref 36.0–46.0)
Hemoglobin: 9.6 g/dL — ABNORMAL LOW (ref 12.0–15.0)
MCH: 32.2 pg (ref 26.0–34.0)
MCHC: 30.6 g/dL (ref 30.0–36.0)
MCV: 105.4 fL — ABNORMAL HIGH (ref 80.0–100.0)
Platelets: 339 10*3/uL (ref 150–400)
RBC: 2.98 MIL/uL — ABNORMAL LOW (ref 3.87–5.11)
RDW: 13.4 % (ref 11.5–15.5)
WBC: 19.2 10*3/uL — ABNORMAL HIGH (ref 4.0–10.5)
nRBC: 0 % (ref 0.0–0.2)

## 2022-09-24 LAB — MAGNESIUM: Magnesium: 2.3 mg/dL (ref 1.7–2.4)

## 2022-09-24 MED ORDER — TRAZODONE HCL 50 MG PO TABS
25.0000 mg | ORAL_TABLET | Freq: Every evening | ORAL | Status: DC | PRN
  Administered 2022-09-24: 25 mg via ORAL
  Filled 2022-09-24: qty 1

## 2022-09-24 MED ORDER — GUAIFENESIN-DM 100-10 MG/5ML PO SYRP: 5.0000 mL | ORAL_SOLUTION | ORAL | Status: DC | PRN

## 2022-09-24 NOTE — Plan of Care (Signed)
  Problem: Cardiac: Goal: Ability to achieve and maintain adequate cardiopulmonary perfusion will improve Note: Decreased to 2L oxygen per nasal cannula. Patient receiving lasix po and has adequate urine output.  MAP WNL.

## 2022-09-24 NOTE — Plan of Care (Signed)
  Problem: Clinical Measurements: Goal: Will remain free from infection Outcome: Progressing   Problem: Activity: Goal: Risk for activity intolerance will decrease Outcome: Progressing   

## 2022-09-24 NOTE — Progress Notes (Signed)
PROGRESS NOTE  Erin Ingram ZOX:[id number] DOB: 1940/07/23 DOA: 09/21/2022 PCP: Mast, Man X, NP   LOS: 3 days   Brief Narrative / Interim history: 82 year old with past medical history significant for microcytic anemia, osteoarthritis, stage III CKD, depression, GERD, hiatal hernia, hyperlipidemia, hypothyroidism, obesity, OSA on CPAP, polymyalgia rheumatica, history of multiple episode of pneumonia, subclavian artery stenosis, TIA, CAD, CABG chronic diastolic heart failure who [facility] with progressive worsening shortness of breath, lower extremity edema, orthopnea cough, and found to be hypoxic oxygen as low as 86%.  Symptoms started a week ago. Patient admitted for acute hypoxic respiratory failure in the setting of acute diastolic heart failure exacerbation.  She was initially started on BiPAP .  Subjective / 24h Interval events: Feeling much better this morning.  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  Developing small pleural effusion.  BNP at 1700, elevation of troponin -Cardiology consulted appreciate input -2D echo shows worsening LV function, cardiology recommending medical management.  Started Entresto, was placed on IV diuretics but now on oral Lasix -Clinically much improved today  Active problems   Acute Hypoxic Respiratory Failure, In setting HF and presume COPD exacerbation. PNA - Reports history of smoking, but quit a long time ago.  Has been started on nebulizers, IV steroids, continue   CAD, elevation of troponin -Elevation of troponin in the setting of heart failure exacerbation.  Medical management per cardiology   Leukocytosis -Chest x-ray: With patchy opacities in the bases which may be due to additional edema or superimposed pneumonia.  On antibiotics   B12 deficiency anemia - Continue with B12 supplement.   Essential hypertension - Continue regimen as below   PAD - Continue with Lipitor and Plavix.   Hyperlipidemia - Continue with Lipitor   GERD - Continue with PPI   Hypothyroidism - Continue with Synthroid   Memory loss, dementia - Continue with Namenda   Polymyalgia Rheumatica - On Chronic prednisone 5 mg daily. Holding while on IV steroid.    Hypokalemia, Hypomagnesemia - replete and monitor   Scheduled Meds:  atorvastatin  10 mg Oral q1800   azithromycin  500 mg Oral Daily   budesonide (PULMICORT) nebulizer solution  0.25 mg Nebulization BID   Chlorhexidine Gluconate Cloth  6 each Topical Daily   clopidogrel  75 mg Oral Daily   cyanocobalamin  1,000 mcg Oral Daily   dapagliflozin propanediol  10 mg Oral Daily   enoxaparin (LOVENOX) injection  30 mg Subcutaneous Q24H   furosemide  20 mg Oral Daily   ipratropium-albuterol  3 mL Nebulization Q6H   levothyroxine  75 mcg Oral Q0600   memantine  10 mg Oral BID   methylPREDNISolone (SOLU-MEDROL) injection  40 mg Intravenous Daily   metoCLOPramide  5 mg Oral BID   metoprolol succinate  25 mg Oral Daily   mirabegron ER  50 mg Oral Daily  mouth rinse  15 mL Mouth Rinse 4 times per day   pantoprazole  40 mg Oral BID   sacubitril-valsartan  1 tablet Oral BID   spironolactone  12.5 mg Oral Daily   venlafaxine XR  150 mg Oral Daily   Continuous Infusions:   PRN Meds:.acetaminophen **OR** acetaminophen,  guaiFENesin-dextromethorphan, ipratropium-albuterol, nitroGLYCERIN, mouth rinse, traZODone  Current Outpatient Medications  Medication Instructions   acetaminophen (TYLENOL) 1,000 mg, Oral, 2 times daily PRN   alum & mag hydroxide-simeth (MAALOX PLUS) 400-400-40 MG/5ML suspension 10 mLs, Oral, As needed   atorvastatin (LIPITOR) 10 MG tablet TAKE 1 TABLET ONCE DAILY.   Calcium-Phosphorus-Vitamin D (CALCIUM/VITAMIN D3/ADULT GUMMY) 250-100-500 MG-MG-UNIT CHEW 1 Piece of gum, Oral, 2 times daily   clopidogrel (PLAVIX) 75 MG tablet TAKE 1 TABLET ONCE DAILY.   COENZYME Q-10 PO 10 mg, Oral, Daily at bedtime   cyanocobalamin 1,000 mcg, Oral, Daily   diclofenac Sodium (VOLTAREN) 4 g, Topical, 4 times daily   docusate sodium (COLACE) 100 mg, Oral, Daily PRN   fluticasone (FLONASE) 50 MCG/ACT nasal spray 1 spray, Each Nare, Daily PRN   furosemide (LASIX) 20 mg, Oral, Daily   isosorbide mononitrate (IMDUR) 120 mg, Oral, Daily   ketoconazole (NIZORAL) 2 % shampoo 1 Application, Topical, 2 times weekly, Tuesday and Friday   levocetirizine (XYZAL) 5 MG tablet TAKE 1 TABLET ONCE DAILY IN THE EVENING.   levothyroxine (SYNTHROID) 75 MCG tablet TAKE 1 TABLET ONCE DAILY ON EMPTY STOMACH.   losartan (COZAAR) 12.5 mg, Oral, Daily   melatonin 1 mg, Oral, Daily at bedtime   memantine (NAMENDA) 10 mg, Oral, Nightly   Menthol, Topical Analgesic, (BIOFREEZE) 10 % LIQD 1 Application, Apply externally, As needed   methocarbamol (ROBAXIN) 750 mg, Oral, Every 6 hours PRN   metoCLOPramide (REGLAN) 5 mg, Oral, 2 times daily   metoprolol succinate (TOPROL-XL) 25 mg, Oral, Daily, Take with or immediately following a meal.    miconazole (MICOTIN) 2 % powder Topical, 2 times daily,  Apply to groin and pannus topically   MYRBETRIQ 50 MG TB24 tablet TAKE 1 TABLET BY MOUTH DAILY.   nitroGLYCERIN (NITROSTAT) 0.4 mg, Sublingual, Every 5 min PRN, X 3 doses    pantoprazole (PROTONIX) 40 mg, Oral, 2 times daily   potassium  chloride (MICRO-K) 10 MEQ CR capsule 20 mEq, Oral, 2 times daily   predniSONE (DELTASONE) 5 mg, Oral, Daily   PSYLLIUM HUSK PO 0.4 g, Oral, Daily, May keep at bedside    ranolazine (RANEXA) 1000 MG SR tablet TAKE 1 TABLET BY MOUTH TWICE DAILY.   venlafaxine (EFFEXOR) 150 mg, Oral, Daily   Vitamin D 2,000 Units, Oral, Daily   zinc oxide 20 % ointment 1 application , Topical, As needed    Diet Orders (From admission, onward)     Start     Ordered   09/21/22 0619  Diet Heart Room service appropriate? Yes; Fluid consistency: Thin  Diet effective now       Question Answer Comment  Room service appropriate? Yes   Fluid consistency: Thin      09/21/22 0618            DVT prophylaxis: enoxaparin (LOVENOX) injection 30 mg Start: 09/23/22 1000 SCDs Start: 09/21/22 1610   Lab Results  Component Value Date   PLT 339 09/24/2022      Code Status: DNR  Family Communication: No family at bedside  Status is: Inpatient Remains inpatient appropriate because: severity of illness  Level of care: Stepdown  Consultants:  Cardiology   Objective: Vitals:   09/24/22 0600 09/24/22 0729 09/24/22 0730 09/24/22 0800  BP: (!) 99/42   115/88  Pulse: 93 92 94 (!) 103  Resp: 18 16 20  (!) 24  Temp:  98 F (36.7 C)    TempSrc:  Oral    SpO2: 100% 100% 100% 96%  Weight:      Height:        Intake/Output Summary (Last 24 hours) at 09/24/2022 1105 Last data filed at 09/24/2022 0600 Gross per 24 hour  Intake 120 ml  Output 1450 ml  Net -1330 ml    Wt Readings from Last 3 Encounters:  09/24/22 83.5 kg  09/09/22 86.3 kg  09/03/22 86.2 kg    Examination:  Constitutional: NAD Eyes: lids and conjunctivae normal, no scleral icterus ENMT: mmm Neck: normal, supple Respiratory: clear to auscultation bilaterally, no wheezing, no crackles. Normal respiratory effort.  Cardiovascular: Regular rate and rhythm, no murmurs / rubs / gallops. No LE edema. Abdomen: soft, no distention, no  tenderness. Bowel sounds positive.  Skin: no rashes  Data Reviewed: I have independently reviewed following labs and imaging studies   CBC Recent Labs  Lab 09/21/22 0423 09/22/22 0744 09/23/22 0323 09/24/22 0305  WBC 16.0* 14.1* 16.9* 19.2*  HGB 9.0* 9.3* 8.6* 9.6*  HCT 29.1* 30.7* 28.5* 31.4*  PLT 310 299 292 339  MCV 105.1* 105.9* 106.3* 105.4*  MCH 32.5 32.1 32.1 32.2  MCHC 30.9 30.3 30.2 30.6  RDW 13.8 14.0 13.5 13.4  LYMPHSABS 1.7  --   --   --   MONOABS 1.2*  --   --   --   EOSABS 0.1  --   --   --   BASOSABS 0.0  --   --   --      Recent Labs  Lab 09/21/22 0423 09/21/22 0627 09/22/22 0744 09/22/22 1001 09/23/22 0323 09/24/22 0305  NA 139  --  136  --  138 137  K 3.9  --  3.3*  --  4.7 3.5  CL 107  --  101  --  103 105  CO2 20*  --  23  --  25 22  GLUCOSE 122*  --  100*  --  122* 133*  BUN 30*  --  29*  --  40* 46*  CREATININE 1.35*  --  1.33*  --  1.44* 1.33*  CALCIUM 7.8*  --  7.9*  --  8.1* 8.1*  AST 15  --   --   --   --  15  ALT 13  --   --   --   --  12  ALKPHOS 37*  --   --   --   --  42  BILITOT 0.5  --   --   --   --  0.5  ALBUMIN 3.0*  --   --   --   --  2.7*  MG  --  1.7  --   --  2.2 2.3  CRP  --   --   --  26.7*  --   --   PROCALCITON  --   --  0.11  --   --   --   BNP 1,723.8*  --   --   --   --   --      ------------------------------------------------------------------------------------------------------------------ No results for input(s): "CHOL", "HDL", "LDLCALC", "TRIG", "CHOLHDL", "LDLDIRECT" in the last 72 hours.  Lab Results  Component Value Date  HGBA1C 5.1 02/14/2019   ------------------------------------------------------------------------------------------------------------------ No results for input(s): "TSH", "T4TOTAL", "T3FREE", "THYROIDAB" in the last 72 hours.  Invalid input(s): "FREET3"  Cardiac Enzymes No results for input(s): "CKMB", "TROPONINI", "MYOGLOBIN" in the last 168 hours.  Invalid input(s):  "CK" ------------------------------------------------------------------------------------------------------------------    Component Value Date/Time   BNP 1,723.8 (H) 09/21/2022 0423    CBG: No results for input(s): "GLUCAP" in the last 168 hours.  Recent Results (from the past 240 hour(s))  Resp panel by RT-PCR (RSV, Flu A&B, Covid) Anterior Nasal Swab     Status: None   Collection Time: 09/21/22  4:23 AM   Specimen: Anterior Nasal Swab  Result Value Ref Range Status   SARS Coronavirus 2 by RT PCR NEGATIVE NEGATIVE Final    Comment: (NOTE) SARS-CoV-2 target nucleic acids are NOT DETECTED.  The SARS-CoV-2 RNA is generally detectable in upper respiratory specimens during the acute phase of infection. The lowest concentration of SARS-CoV-2 viral copies this assay can detect is 138 copies/mL. A negative result does not preclude SARS-Cov-2 infection and should not be used as the sole basis for treatment or other patient management decisions. A negative result may occur with  improper specimen collection/handling, submission of specimen other than nasopharyngeal swab, presence of viral mutation(s) within the areas targeted by this assay, and inadequate number of viral copies(<138 copies/mL). A negative result must be combined with clinical observations, patient history, and epidemiological information. The expected result is Negative.  Fact Sheet for Patients:  BloggerCourse.com  Fact Sheet for Healthcare Providers:  SeriousBroker.it  This test is no t yet approved or cleared by the [place] FDA and  has been authorized for detection and/or diagnosis of SARS-CoV-2 by FDA under an Emergency Use Authorization (EUA). This EUA will remain  in effect (meaning this test can be used) for the duration of the COVID-19 declaration under Section 564(b)(1) of the Act, 21 U.S.C.section 360bbb-3(b)(1), unless the authorization is  terminated  or revoked sooner.       Influenza A by PCR NEGATIVE NEGATIVE Final   Influenza B by PCR NEGATIVE NEGATIVE Final    Comment: (NOTE) The Xpert Xpress SARS-CoV-2/FLU/RSV plus assay is intended as an aid in the diagnosis of influenza from Nasopharyngeal swab specimens and should not be used as a sole basis for treatment. Nasal washings and aspirates are unacceptable for Xpert Xpress SARS-CoV-2/FLU/RSV testing.  Fact Sheet for Patients: BloggerCourse.com  Fact Sheet for Healthcare Providers: SeriousBroker.it  This test is not yet approved or cleared by the [place] FDA and has been authorized for detection and/or diagnosis of SARS-CoV-2 by FDA under an Emergency Use Authorization (EUA). This EUA will remain in effect (meaning this test can be used) for the duration of the COVID-19 declaration under Section 564(b)(1) of the Act, 21 U.S.C. section 360bbb-3(b)(1), unless the authorization is terminated or revoked.     Resp Syncytial Virus by PCR NEGATIVE NEGATIVE Final    Comment: (NOTE) Fact Sheet for Patients: BloggerCourse.com  Fact Sheet for Healthcare Providers: SeriousBroker.it  This test is not yet approved or cleared by the [place] FDA and has been authorized for detection and/or diagnosis of SARS-CoV-2 by FDA under an Emergency Use Authorization (EUA). This EUA will remain in effect (meaning this test can be used) for the duration of the COVID-19 declaration under Section 564(b)(1) of the Act, 21 U.S.C. section 360bbb-3(b)(1), unless the authorization is terminated or revoked.  Performed at [facility], [address], [place], [place] [address]  MRSA Next Gen by PCR, Nasal     Status: None   Collection Time: 09/21/22 12:24 PM   Specimen: Nasal Mucosa; Nasal Swab  Result Value Ref Range Status   MRSA by PCR Next Gen NOT  DETECTED NOT DETECTED Final    Comment: (NOTE) The GeneXpert MRSA Assay (FDA approved for NASAL specimens only), is one component of a comprehensive MRSA colonization surveillance program. It is not intended to diagnose MRSA infection nor to guide or monitor treatment for MRSA infections. Test performance is not FDA approved in patients less than 35 years old. Performed at [facility], [address], [place], [place] [address]      Radiology Studies: No results found.   Pamella Pert, MD, PhD [facility]  Between 7 am - 7 pm I am available, please contact me via Amion (for emergencies) or Securechat (non urgent messages)  Between 7 pm - 7 am I am not available, please contact night coverage MD/APP via Amion

## 2022-09-24 NOTE — TOC Progression Note (Addendum)
Transition of Care [facility]) - Progression Note    Patient Details  Name: Erin Ingram MRN: [id number] Date of Birth: 1941/03/06  Transition of [facility] CM/SW Contact  Otelia Santee, LCSW Phone Number: 09/24/2022, 2:54 PM  Clinical Narrative:    Met with pt at bedside. Pt from [facility]. Pt agreeable to SNF at [facility] prior to returning to ALF.  Spoke with Florentina Addison at [facility] who confirmed bed availability. FL-2 completed.  Attempted to call son to provide update.  TOC will continue to follow for discharge.    Expected Discharge Plan: Assisted Living Barriers to Discharge: Continued Medical Work up  Expected Discharge Plan and Services In-house Referral: NA Discharge Planning Services: NA   Living arrangements for the past 2 months: Assisted Living Facility                                       Social Determinants of Health (SDOH) Interventions SDOH Screenings   Food Insecurity: No Food Insecurity (09/21/2022)  Housing: Low Risk  (09/21/2022)  Transportation Needs: No Transportation Needs (09/21/2022)  Utilities: Not At Risk (09/21/2022)  Financial Resource Strain: Low Risk  (12/07/2017)  Physical Activity: Inactive (12/07/2017)  Social Connections: Somewhat Isolated (12/07/2017)  Stress: Stress Concern Present (12/07/2017)  Tobacco Use: Medium Risk (09/21/2022)    Readmission Risk Interventions    09/22/2022    1:02 PM  Readmission Risk Prevention Plan  Transportation Screening Complete  HRI or Home Care Consult Complete  Social Work Consult for Recovery Care Planning/Counseling Complete  Palliative Care Screening Not Applicable

## 2022-09-24 NOTE — Progress Notes (Signed)
09/23/22 2230  What Happened  Was fall witnessed? Yes  Who witnessed fall? Cristal Generous, RN ; Silas Sacramento, NT  Patients activity before fall ambulating-assisted;to/from bed, chair, or stretcher  Point of contact buttocks  Was patient injured? No  Provider Notification  Provider Name/Title Anthoney Harada, ARNP  Date Provider Notified 09/23/22  Time Provider Notified 2204  Method of Notification Call  Notification Reason Fall  Provider response No new orders  Date of Provider Response 09/23/22  Time of Provider Response 1004  Follow Up  Family notified Yes - comment  Time family notified 2230  Additional tests No  Progress note created (see row info) Yes  Blank note created Yes  Adult Fall Risk Assessment  Risk Factor Category (scoring not indicated) Fall has occurred during this admission (document High fall risk)  Patient Fall Risk Level High fall risk  Adult Fall Risk Interventions  Required Bundle Interventions *See Row Information* High fall risk - low, moderate, and high requirements implemented  Additional Interventions Use of appropriate toileting equipment (bedpan, BSC, etc.)  Fall intervention(s) refused/Patient educated regarding refusal Open door if unsupervised  Screening for Fall Injury Risk (To be completed on HIGH fall risk patients) - Assessing Need for Floor Mats  Risk For Fall Injury- Criteria for Floor Mats Previous fall this admission  Will Implement Floor Mats Yes  Vitals  Temp 97.6 F (36.4 C)  Temp Source Oral  BP (!) 123/56  MAP (mmHg) 78  BP Location Right Arm  BP Method Automatic  Patient Position (if appropriate) Lying  Pulse Rate 94  Pulse Rate Source Monitor  ECG Heart Rate 96  Cardiac Rhythm ST  Resp 19  Oxygen Therapy  SpO2 100 %  O2 Device Nasal Cannula  O2 Flow Rate (L/min) 4 L/min  Pain Assessment  Pain Scale 0-10  Pain Score 3  Pain Type Chronic pain  Pain Location Back  Pain Descriptors / Indicators Aching  Pain  Intervention(s) Medication (See eMAR)  PCA/Epidural/Spinal Assessment  Respiratory Pattern Dyspnea with exertion  Neurological  Neuro (WDL) WDL  Level of Consciousness Alert  Orientation Level Oriented X4  Cognition Appropriate at baseline  Speech Clear  R Pupil Size (mm) 3  R Pupil Shape Round  R Pupil Reaction Brisk  L Pupil Size (mm) 3  L Pupil Shape Round  L Pupil Reaction Brisk  Glasgow Coma Scale  Eye Opening 4  Best Verbal Response (NON-intubated) 5  Best Motor Response 6  Glasgow Coma Scale Score 15  Musculoskeletal  Musculoskeletal (WDL) X  Assistive Device Front wheel walker  Generalized Weakness Yes  Weight Bearing Restrictions No  Integumentary  Integumentary (WDL) X (unchanged from prior assessment)  Skin Color Appropriate for ethnicity  Skin Condition Dry  Skin Integrity Abrasion  Abrasion Location Wrist  Abrasion Location Orientation Left  Abrasion Intervention Foam    Patient was standing with her walker, waiting to transfer to the bed. The NT was wiping her leg and instructed her to hold onto the walker. When this RN entered the room, the patient let go of the walker in an attempt to point at an abrasion on her hand that she wanted a dressing placed over from a previous skin tear at her nursing facility. She then lost her balance and started stumbling backwards towards her recliner chair, and her buttock hit the edge of the chair and then the floor. The NT attempted to assist the patient to the ground. The patient was assessed by this RN,  no acute injury noted and no new skin issues noted. This RN and the NT then assisted the patient back to a standing position with a gait belt and the patient used her walker again to ambulate to her Bed.

## 2022-09-24 NOTE — Progress Notes (Signed)
Rounding Note    Patient Name: Erin Ingram Date of Encounter: 09/24/2022  [facility] Cardiologist: Lance Muss, MD   Subjective   Pt denies CP or dyspnea  Inpatient Medications    Scheduled Meds:  atorvastatin  10 mg Oral q1800   azithromycin  500 mg Oral Daily   budesonide (PULMICORT) nebulizer solution  0.25 mg Nebulization BID   Chlorhexidine Gluconate Cloth  6 each Topical Daily   clopidogrel  75 mg Oral Daily   cyanocobalamin  1,000 mcg Oral Daily   dapagliflozin propanediol  10 mg Oral Daily   enoxaparin (LOVENOX) injection  30 mg Subcutaneous Q24H   furosemide  20 mg Oral Daily   ipratropium-albuterol  3 mL Nebulization Q6H   isosorbide mononitrate  30 mg Oral Daily   levothyroxine  75 mcg Oral Q0600   memantine  10 mg Oral BID   methylPREDNISolone (SOLU-MEDROL) injection  40 mg Intravenous Daily   metoCLOPramide  5 mg Oral BID   metoprolol succinate  25 mg Oral Daily   mirabegron ER  50 mg Oral Daily   mouth rinse  15 mL Mouth Rinse 4 times per day   pantoprazole  40 mg Oral BID   sacubitril-valsartan  1 tablet Oral BID   spironolactone  12.5 mg Oral Daily   venlafaxine XR  150 mg Oral Daily   Continuous Infusions:   PRN Meds: acetaminophen **OR** acetaminophen, guaiFENesin-dextromethorphan, ipratropium-albuterol, nitroGLYCERIN, mouth rinse, traZODone   Vital Signs    Vitals:   09/24/22 0357 09/24/22 0400 09/24/22 0500 09/24/22 0600  BP:  114/63  (!) 99/42  Pulse:  97 92 93  Resp:  18 (!) 21 18  Temp: 97.6 F (36.4 C) 97.6 F (36.4 C)    TempSrc: Axillary Axillary    SpO2:  100% 99% 100%  Weight: 83.5 kg     Height:        Intake/Output Summary (Last 24 hours) at 09/24/2022 0716 Last data filed at 09/24/2022 0600 Gross per 24 hour  Intake 120 ml  Output 1450 ml  Net -1330 ml       09/24/2022    3:57 AM 09/23/2022    5:00 AM 09/22/2022    4:50 AM  Last 3 Weights  Weight (lbs) 184 lb 1.4 oz 188 lb 7.9 oz 188 lb 7.9 oz   Weight (kg) 83.5 kg 85.5 kg 85.5 kg      Telemetry     Sinus- Personally Reviewed   Physical Exam   GEN: NAD.  WD Neck: No adenopathy Cardiac: RRR Respiratory: Diminished breath sounds; no wheeze GI: Soft, NT/ND, no masses MS: No edema Neuro:  No focal findings Psych: Normal affect   Labs    High Sensitivity Troponin:   Recent Labs  Lab 09/21/22 0423 09/21/22 0627  TROPONINIHS 278* 270*      Chemistry Recent Labs  Lab 09/21/22 0423 09/21/22 0627 09/22/22 0744 09/23/22 0323 09/24/22 0305  NA 139  --  136 138 137  K 3.9  --  3.3* 4.7 3.5  CL 107  --  101 103 105  CO2 20*  --  23 25 22   GLUCOSE 122*  --  100* 122* 133*  BUN 30*  --  29* 40* 46*  CREATININE 1.35*  --  1.33* 1.44* 1.33*  CALCIUM 7.8*  --  7.9* 8.1* 8.1*  MG  --  1.7  --  2.2 2.3  PROT 6.1*  --   --   --  5.8*  ALBUMIN 3.0*  --   --   --  2.7*  AST 15  --   --   --  15  ALT 13  --   --   --  12  ALKPHOS 37*  --   --   --  42  BILITOT 0.5  --   --   --  0.5  GFRNONAA 39*  --  40* 36* 40*  ANIONGAP 12  --  12 10 10       Hematology Recent Labs  Lab 09/22/22 0744 09/23/22 0323 09/24/22 0305  WBC 14.1* 16.9* 19.2*  RBC 2.90* 2.68* 2.98*  HGB 9.3* 8.6* 9.6*  HCT 30.7* 28.5* 31.4*  MCV 105.9* 106.3* 105.4*  MCH 32.1 32.1 32.2  MCHC 30.3 30.2 30.6  RDW 14.0 13.5 13.4  PLT 299 292 339     BNP Recent Labs  Lab 09/21/22 0423  BNP 1,723.8*       Radiology    ECHOCARDIOGRAM COMPLETE  Result Date: 09/22/2022    ECHOCARDIOGRAM REPORT   Patient Name:   Erin Ingram Date of Exam: 09/22/2022 Medical Rec #:  [id number]        Height:       58.0 in Accession #:    [id number]       Weight:       188.5 lb Date of Birth:  [birth year]        BSA:          1.776 m Patient Age:    82 years         BP:           128/57 mmHg Patient Gender: F                HR:           52 bpm. Exam Location:  Inpatient Procedure: 2D Echo, Cardiac Doppler, Color Doppler and Intracardiac            Opacification  Agent Indications:     CHF - Acute Diastolic  History:         Patient has prior history of Echocardiogram examinations, most                  recent 07/18/2018. CHF, CAD, Prior CABG, PAD, CKD and TIA,                  Signs/Symptoms:elevated troponin, Edema and Dyspnea; Risk                  Factors:Hypertension, Dyslipidemia, Sleep Apnea and Former                  Smoker.  Sonographer:     Wallie Char Referring Phys:  [id number] DAVID MANUEL ORTIZ Diagnosing Phys: Charlton Haws MD IMPRESSIONS  1. Global hypokinesis worse in septum and apex. Left ventricular ejection fraction, by estimation, is 30 to 35%. The left ventricle has moderately decreased function. The left ventricle demonstrates regional wall motion abnormalities (see scoring diagram/findings for description). The left ventricular internal cavity size was moderately dilated. Left ventricular diastolic parameters are indeterminate.  2. Right ventricular systolic function is normal. The right ventricular size is normal.  3. The mitral valve is degenerative. No evidence of mitral valve regurgitation. No evidence of mitral stenosis. Severe mitral annular calcification.  4. The aortic valve is tricuspid. There is moderate calcification of the aortic valve. There is moderate thickening of the aortic valve. Aortic valve regurgitation  is not visualized. Mild aortic valve stenosis.  5. The inferior vena cava is normal in size with greater than 50% respiratory variability, suggesting right atrial pressure of 3 mmHg. FINDINGS  Left Ventricle: Global hypokinesis worse in septum and apex. Left ventricular ejection fraction, by estimation, is 30 to 35%. The left ventricle has moderately decreased function. The left ventricle demonstrates regional wall motion abnormalities. Definity contrast agent was given IV to delineate the left ventricular endocardial borders. The left ventricular internal cavity size was moderately dilated. There is no left ventricular hypertrophy.  Left ventricular diastolic parameters are indeterminate. Right Ventricle: The right ventricular size is normal. No increase in right ventricular wall thickness. Right ventricular systolic function is normal. Left Atrium: Left atrial size was normal in size. Right Atrium: Right atrial size was normal in size. Pericardium: There is no evidence of pericardial effusion. Mitral Valve: The mitral valve is degenerative in appearance. There is mild thickening of the mitral valve leaflet(s). There is mild calcification of the mitral valve leaflet(s). Severe mitral annular calcification. No evidence of mitral valve regurgitation. No evidence of mitral valve stenosis. MV peak gradient, 7.2 mmHg. The mean mitral valve gradient is 4.0 mmHg. Tricuspid Valve: The tricuspid valve is normal in structure. Tricuspid valve regurgitation is mild . No evidence of tricuspid stenosis. Aortic Valve: The aortic valve is tricuspid. There is moderate calcification of the aortic valve. There is moderate thickening of the aortic valve. Aortic valve regurgitation is not visualized. Mild aortic stenosis is present. Aortic valve mean gradient measures 5.0 mmHg. Aortic valve peak gradient measures 8.0 mmHg. Aortic valve area, by VTI measures 1.73 cm. Pulmonic Valve: The pulmonic valve was normal in structure. Pulmonic valve regurgitation is trivial. No evidence of pulmonic stenosis. Aorta: The aortic root is normal in size and structure. Venous: The inferior vena cava is normal in size with greater than 50% respiratory variability, suggesting right atrial pressure of 3 mmHg. IAS/Shunts: No atrial level shunt detected by color flow Doppler.  LEFT VENTRICLE PLAX 2D LVIDd:         4.60 cm      Diastology LVIDs:         3.50 cm      LV e' medial:    4.10 cm/s LV PW:         0.90 cm      LV E/e' medial:  30.5 LV IVS:        0.80 cm      LV e' lateral:   6.19 cm/s LVOT diam:     1.70 cm      LV E/e' lateral: 20.2 LV SV:         48 LV SV Index:   27 LVOT  Area:     2.27 cm  LV Volumes (MOD) LV vol d, MOD A2C: 126.0 ml LV vol d, MOD A4C: 126.0 ml LV vol s, MOD A2C: 86.1 ml LV vol s, MOD A4C: 87.1 ml LV SV MOD A2C:     39.9 ml LV SV MOD A4C:     126.0 ml LV SV MOD BP:      40.1 ml RIGHT VENTRICLE             IVC RV Basal diam:  3.50 cm     IVC diam: 1.90 cm RV S prime:     10.10 cm/s TAPSE (M-mode): 1.0 cm LEFT ATRIUM             Index  RIGHT ATRIUM           Index LA diam:        3.20 cm 1.80 cm/m   RA Area:     16.20 cm LA Vol (A2C):   59.9 ml 33.73 ml/m  RA Volume:   43.40 ml  24.44 ml/m LA Vol (A4C):   63.9 ml 35.98 ml/m LA Biplane Vol: 61.9 ml 34.86 ml/m  AORTIC VALVE AV Area (Vmax):    1.69 cm AV Area (Vmean):   1.56 cm AV Area (VTI):     1.73 cm AV Vmax:           141.50 cm/s AV Vmean:          103.000 cm/s AV VTI:            0.278 m AV Peak Grad:      8.0 mmHg AV Mean Grad:      5.0 mmHg LVOT Vmax:         105.40 cm/s LVOT Vmean:        70.850 cm/s LVOT VTI:          0.212 m LVOT/AV VTI ratio: 0.76  AORTA Ao Root diam: 2.70 cm Ao Asc diam:  3.20 cm MITRAL VALVE                TRICUSPID VALVE MV Area (PHT): 4.68 cm     TR Peak grad:   56.0 mmHg MV Area VTI:   1.65 cm     TR Vmax:        374.00 cm/s MV Peak grad:  7.2 mmHg MV Mean grad:  4.0 mmHg     SHUNTS MV Vmax:       1.34 m/s     Systemic VTI:  0.21 m MV Vmean:      87.3 cm/s    Systemic Diam: 1.70 cm MV Decel Time: 162 msec MV E velocity: 125.00 cm/s MV A velocity: 133.00 cm/s MV E/A ratio:  0.94 Charlton Haws MD Electronically signed by Charlton Haws MD Signature Date/Time: 09/22/2022/11:24:09 AM    Final (Updated)      Patient Profile     82 year old female with past medical history of coronary artery disease status post coronary bypass and graft, chronic diastolic congestive heart failure, prior TIA, obstructive sleep apnea, hypertension, hyperlipidemia, chronic stage IIIa kidney disease, subclavian stenosis for evaluation of acute on chronic diastolic congestive heart failure. Last  echocardiogram April 2020 showed normal LV function.  Echocardiogram this admission shows ejection fraction 30 to 35% worse in the septum and apex, moderate left ventricular enlargement and mild aortic stenosis with mean gradient 5 mmHg and aortic valve area 1.73 cm.  Assessment & Plan    1 acute on chronic combined systolic/diastolic congestive heart failure-I/O-4328.  Wt 85.5 kg.  Volume status has improved as has symptoms.  Will hold Lasix today and likely resume 20 mg daily tomorrow.  Continue Farxiga.  Continue to follow renal function.     2 cardiomyopathy-LV function reduced compared to previous.  However she has multiple comorbidities and is a no CODE BLUE.  I would favor medical therapy for now.  Continue Entresto, Toprol, Farxiga and spironolactone.  Discontinue isosorbide as blood pressure is borderline.  Would plan to repeat echocardiogram in 3 months.  If LV function remains decreased could consider ischemia evaluation at that point.   3 coronary artery disease-continue Plavix and statin.  Ranolazine has been discontinued.   4 hyperlipidemia-continue statin.   5 hypertension-blood  pressure is controlled.  Reading this morning borderline.  Discontinue Imdur and follow.   6 chronic stage IIIa kidney disease-renal function slightly worse today.  Will hold Lasix today and likely resume in the next 24 to 48 hours.  Follow closely with medication adjustments above.   For questions or updates, please contact [facility] Please consult [email or web address] for contact info under        Signed, Olga Millers, MD  09/24/2022, 7:16 AM

## 2022-09-24 NOTE — Progress Notes (Signed)
   09/24/22 2300  BiPAP/CPAP/SIPAP  BiPAP/CPAP/SIPAP Pt Type Adult   PT. seen for BiPAP status, V60 currently not in room, only wore while in ICU, and not tolerated, stated," she still has smaller BiPAP @ home but not using every evening,? Non-compliant.Marland KitchenMarland Kitchen

## 2022-09-24 NOTE — NC FL2 (Signed)
[place] MEDICAID FL2 LEVEL OF CARE FORM     IDENTIFICATION  Patient Name: Erin Ingram Birthdate: 05/19/1940 Sex: female Admission Date (Current Location): 09/21/2022  [facility] and [place] Number:  Producer, television/film/video and Address:  [facility],  [address] [place], [place] [address]      Provider Number: [id number]  Attending Physician Name and Address:  Leatha Gilding, MD  Relative Name and Phone Number:       Current Level of Care: Hospital Recommended Level of Care: Skilled Nursing Facility Prior Approval Number:    Date Approved/Denied:   PASRR Number: [id number]  Discharge Plan: SNF    Current Diagnoses: Patient Active Problem List   Diagnosis Date Noted   Acute on chronic diastolic heart failure (HCC) 09/21/2022   Elevated troponin 09/21/2022   Discomfort in chest 12/04/2021   Memory loss 06/06/2021   Scalp hematoma 03/28/2021   Anemia due to chronic kidney disease 08/26/2020   Urinary frequency 03/11/2020   Insomnia 10/17/2018   Chest pain, rule out acute myocardial infarction 10/14/2018   Weight gain 08/18/2018   Hypocalcemia 07/20/2018   Syncope 07/18/2018   CKD (chronic kidney disease) stage 3, GFR 30-59 ml/min (HCC) 06/10/2018   Constipation 06/02/2018   Allergic rhinitis 06/02/2018   Osteoporosis 06/02/2018   Tremor 06/15/2017   Gait instability 06/15/2017   Polymyalgia rheumatica (HCC) 06/11/2017   Fall 06/11/2017   Visual hallucination 06/10/2017   GERD (gastroesophageal reflux disease) 06/10/2017   Hypothyroidism 06/10/2017   Abnormal movement 06/10/2017   Dysphagia 07/27/2016   Irritable bowel syndrome with diarrhea 07/27/2016   Unstable angina pectoris (HCC) 04/10/2016   Bilateral lower extremity edema 03/13/2016   (HFpEF) heart failure with preserved ejection fraction (HCC) 10/08/2015   CAD (coronary artery disease) 10/08/2015   Subclavian arterial stenosis (HCC)    Hyperlipidemia LDL goal <70    PAD (peripheral  artery disease) (HCC) 04/11/2015   Essential hypertension 04/11/2014   Morbid obesity (HCC) 03/22/2013   Vitamin B12 deficiency anemia 03/22/2013   Depression, recurrent (HCC) 03/22/2013   Generalized osteoarthritis of multiple sites 03/22/2013   Sleep apnea 03/22/2013   Impaired fasting glucose 03/22/2013   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   Advanced care planning/counseling discussion 03/22/2013    Orientation RESPIRATION BLADDER Height & Weight     Self, Time, Situation, Place  O2 (2L) Incontinent, External catheter Weight: 184 lb 1.4 oz (83.5 kg) Height:  4\' 10"  (147.3 cm)  BEHAVIORAL SYMPTOMS/MOOD NEUROLOGICAL BOWEL NUTRITION STATUS      Continent Diet (Heart Healthy Diet)  AMBULATORY STATUS COMMUNICATION OF NEEDS Skin   Extensive Assist Verbally Skin abrasions                       Personal Care Assistance Level of Assistance  Bathing, Feeding, Dressing Bathing Assistance: Maximum assistance Feeding assistance: Limited assistance Dressing Assistance: Maximum assistance     Functional Limitations Info  Sight, Hearing, Speech Sight Info: Impaired Hearing Info: Adequate Speech Info: Adequate    SPECIAL CARE FACTORS FREQUENCY  PT (By licensed PT), OT (By licensed OT)     PT Frequency: 5x/wk OT Frequency: 5x/wk            Contractures Contractures Info: Not present    Additional Factors Info  Code Status, Allergies Code Status Info: DNR Allergies Info: Nsaids, Butorphanol, Sulfa Antibiotics, Bisoprolol Fumarate, Butorphanol Tartrate, Codeine, Demerol (Meperidine), Imipramine, Meperidine And Related, Statins, Tequin (Gatifloxacin), Brilinta (Ticagrelor), Pseudoephedrine Hcl, Septra (Sulfamethoxazole-trimethoprim), Sulfamethoxazole-trimethoprim  Current Medications (09/24/2022):  This is the current hospital active medication list Current Facility-Administered Medications  Medication Dose Route Frequency Provider Last Rate Last Admin    acetaminophen (TYLENOL) tablet 650 mg  650 mg Oral Q6H PRN Howerter, Justin B, DO   650 mg at 09/23/22 2230   Or   acetaminophen (TYLENOL) suppository 650 mg  650 mg Rectal Q6H PRN Howerter, Justin B, DO       atorvastatin (LIPITOR) tablet 10 mg  10 mg Oral q1800 Bobette Mo, MD   10 mg at 09/23/22 1811   azithromycin (ZITHROMAX) tablet 500 mg  500 mg Oral Daily Regalado, Belkys A, MD   500 mg at 09/24/22 0922   budesonide (PULMICORT) nebulizer solution 0.25 mg  0.25 mg Nebulization BID Regalado, Belkys A, MD   0.25 mg at 09/24/22 0837   Chlorhexidine Gluconate Cloth 2 % PADS 6 each  6 each Topical Daily Howerter, Justin B, DO   6 each at 09/24/22 0913   clopidogrel (PLAVIX) tablet 75 mg  75 mg Oral Daily Bobette Mo, MD   75 mg at 09/24/22 1610   cyanocobalamin (VITAMIN B12) tablet 1,000 mcg  1,000 mcg Oral Daily Bobette Mo, MD   1,000 mcg at 09/24/22 0912   dapagliflozin propanediol (FARXIGA) tablet 10 mg  10 mg Oral Daily Lewayne Bunting, MD   10 mg at 09/24/22 0907   enoxaparin (LOVENOX) injection 30 mg  30 mg Subcutaneous Q24H Leatha Gilding, MD   30 mg at 09/24/22 9604   furosemide (LASIX) tablet 20 mg  20 mg Oral Daily Lewayne Bunting, MD   20 mg at 09/24/22 0906   guaiFENesin-dextromethorphan (ROBITUSSIN DM) 100-10 MG/5ML syrup 5 mL  5 mL Oral Q4H PRN Leatha Gilding, MD       ipratropium-albuterol (DUONEB) 0.5-2.5 (3) MG/3ML nebulizer solution 3 mL  3 mL Nebulization Q4H PRN Bobette Mo, MD   3 mL at 09/21/22 1651   ipratropium-albuterol (DUONEB) 0.5-2.5 (3) MG/3ML nebulizer solution 3 mL  3 mL Nebulization Q6H Regalado, Belkys A, MD   3 mL at 09/24/22 1353   levothyroxine (SYNTHROID) tablet 75 mcg  75 mcg Oral Q0600 Bobette Mo, MD   75 mcg at 09/24/22 0601   memantine (NAMENDA) tablet 10 mg  10 mg Oral BID Bobette Mo, MD   10 mg at 09/24/22 5409   methylPREDNISolone sodium succinate (SOLU-MEDROL) 40 mg/mL injection 40 mg  40 mg  Intravenous Daily Regalado, Belkys A, MD   40 mg at 09/24/22 0906   metoCLOPramide (REGLAN) tablet 5 mg  5 mg Oral BID Bobette Mo, MD   5 mg at 09/24/22 8119   metoprolol succinate (TOPROL-XL) 24 hr tablet 25 mg  25 mg Oral Daily Bobette Mo, MD   25 mg at 09/24/22 1478   mirabegron ER (MYRBETRIQ) tablet 50 mg  50 mg Oral Daily Bobette Mo, MD   50 mg at 09/24/22 0908   nitroGLYCERIN (NITROSTAT) SL tablet 0.4 mg  0.4 mg Sublingual Q5 min PRN Bobette Mo, MD       Oral care mouth rinse  15 mL Mouth Rinse 4 times per day Howerter, Justin B, DO   15 mL at 09/24/22 0800   Oral care mouth rinse  15 mL Mouth Rinse PRN Howerter, Justin B, DO       pantoprazole (PROTONIX) EC tablet 40 mg  40 mg Oral BID Bobette Mo, MD  40 mg at 09/24/22 1610   sacubitril-valsartan (ENTRESTO) 24-26 mg per tablet  1 tablet Oral BID Lewayne Bunting, MD   1 tablet at 09/24/22 9604   spironolactone (ALDACTONE) tablet 12.5 mg  12.5 mg Oral Daily Lewayne Bunting, MD   12.5 mg at 09/24/22 0908   traZODone (DESYREL) tablet 25 mg  25 mg Oral QHS PRN Leatha Gilding, MD       venlafaxine XR (EFFEXOR-XR) 24 hr capsule 150 mg  150 mg Oral Daily Bobette Mo, MD   150 mg at 09/24/22 5409     Discharge Medications: Please see discharge summary for a list of discharge medications.  Relevant Imaging Results:  Relevant Lab Results:   Additional Information SS# [id number]  Otelia Santee, LCSW

## 2022-09-24 NOTE — Progress Notes (Signed)
Patient off bipap, stating that she is unable to tolerate it tonight.

## 2022-09-24 NOTE — Progress Notes (Signed)
Patient removed bipap, stated she cannot sleep and has been unable to relax, even after administration of ativan earlier. She requested to keep bipap off. Education on risk/benefits provided. Placed back on nasal cannula at 4L.

## 2022-09-24 NOTE — Progress Notes (Signed)
Report given to Iris, RN receiving patient to room 1424.

## 2022-09-24 NOTE — Plan of Care (Signed)
  Problem: Education: Goal: Knowledge of General Education information will improve Description: Including pain rating scale, medication(s)/side effects and non-pharmacologic comfort measures Outcome: Progressing   Problem: Clinical Measurements: Goal: Will remain free from infection Outcome: Progressing   Problem: Activity: Goal: Risk for activity intolerance will decrease 09/24/2022 0257 by Krystal Eaton, RN Outcome: Progressing 09/24/2022 0256 by Krystal Eaton, RN Outcome: Progressing

## 2022-09-24 NOTE — Progress Notes (Signed)
Patient reports she did not sleep last night, and she was up multiple times on her cell phone. She stated she sometimes has episodes like this where she will not sleep at all through the night. She is asking that her provider prescribe a routine sleep aid for the rest of her hospitalization. Will pass on to day shift RN.

## 2022-09-25 ENCOUNTER — Telehealth: Payer: Self-pay | Admitting: Cardiology

## 2022-09-25 DIAGNOSIS — I739 Peripheral vascular disease, unspecified: Secondary | ICD-10-CM

## 2022-09-25 DIAGNOSIS — I5020 Unspecified systolic (congestive) heart failure: Secondary | ICD-10-CM

## 2022-09-25 DIAGNOSIS — J810 Acute pulmonary edema: Secondary | ICD-10-CM | POA: Diagnosis not present

## 2022-09-25 DIAGNOSIS — Z515 Encounter for palliative care: Secondary | ICD-10-CM

## 2022-09-25 DIAGNOSIS — G4700 Insomnia, unspecified: Secondary | ICD-10-CM

## 2022-09-25 DIAGNOSIS — R0602 Shortness of breath: Secondary | ICD-10-CM

## 2022-09-25 DIAGNOSIS — I5033 Acute on chronic diastolic (congestive) heart failure: Secondary | ICD-10-CM | POA: Diagnosis not present

## 2022-09-25 DIAGNOSIS — R4589 Other symptoms and signs involving emotional state: Secondary | ICD-10-CM

## 2022-09-25 DIAGNOSIS — Z7189 Other specified counseling: Secondary | ICD-10-CM

## 2022-09-25 DIAGNOSIS — I5043 Acute on chronic combined systolic (congestive) and diastolic (congestive) heart failure: Secondary | ICD-10-CM | POA: Diagnosis not present

## 2022-09-25 DIAGNOSIS — Z79899 Other long term (current) drug therapy: Secondary | ICD-10-CM

## 2022-09-25 LAB — COMPREHENSIVE METABOLIC PANEL
ALT: 12 U/L (ref 0–44)
AST: 15 U/L (ref 15–41)
Albumin: 2.7 g/dL — ABNORMAL LOW (ref 3.5–5.0)
Alkaline Phosphatase: 43 U/L (ref 38–126)
Anion gap: 11 (ref 5–15)
BUN: 39 mg/dL — ABNORMAL HIGH (ref 8–23)
CO2: 24 mmol/L (ref 22–32)
Calcium: 8.7 mg/dL — ABNORMAL LOW (ref 8.9–10.3)
Chloride: 104 mmol/L (ref 98–111)
Creatinine, Ser: 1.13 mg/dL — ABNORMAL HIGH (ref 0.44–1.00)
GFR, Estimated: 48 mL/min — ABNORMAL LOW (ref >60)
Glucose, Bld: 103 mg/dL — ABNORMAL HIGH (ref 70–99)
Potassium: 3.7 mmol/L (ref 3.5–5.1)
Sodium: 139 mmol/L (ref 135–145)
Total Bilirubin: 0.4 mg/dL (ref 0.3–1.2)
Total Protein: 5.9 g/dL — ABNORMAL LOW (ref 6.5–8.1)

## 2022-09-25 LAB — CBC
HCT: 31.8 % — ABNORMAL LOW (ref 36.0–46.0)
Hemoglobin: 9.6 g/dL — ABNORMAL LOW (ref 12.0–15.0)
MCH: 31.9 pg (ref 26.0–34.0)
MCHC: 30.2 g/dL (ref 30.0–36.0)
MCV: 105.6 fL — ABNORMAL HIGH (ref 80.0–100.0)
Platelets: 350 10*3/uL (ref 150–400)
RBC: 3.01 MIL/uL — ABNORMAL LOW (ref 3.87–5.11)
RDW: 13.3 % (ref 11.5–15.5)
WBC: 19.9 10*3/uL — ABNORMAL HIGH (ref 4.0–10.5)
nRBC: 0 % (ref 0.0–0.2)

## 2022-09-25 LAB — MAGNESIUM: Magnesium: 2.2 mg/dL (ref 1.7–2.4)

## 2022-09-25 MED ORDER — TRAZODONE HCL 50 MG PO TABS
25.0000 mg | ORAL_TABLET | Freq: Every day | ORAL | Status: DC
  Administered 2022-09-25 – 2022-09-26 (×2): 25 mg via ORAL
  Filled 2022-09-25 (×2): qty 1

## 2022-09-25 MED ORDER — PREDNISONE 20 MG PO TABS
20.0000 mg | ORAL_TABLET | Freq: Every day | ORAL | Status: DC
  Administered 2022-09-25 – 2022-09-29 (×5): 20 mg via ORAL
  Filled 2022-09-25 (×5): qty 1

## 2022-09-25 MED ORDER — LOPERAMIDE HCL 2 MG PO CAPS
2.0000 mg | ORAL_CAPSULE | ORAL | Status: DC | PRN
  Administered 2022-09-25: 2 mg via ORAL
  Filled 2022-09-25: qty 1

## 2022-09-25 MED ORDER — TRAMADOL HCL 50 MG PO TABS: 25.0000 mg | ORAL_TABLET | Freq: Four times a day (QID) | ORAL | Status: DC | PRN

## 2022-09-25 MED ORDER — ENOXAPARIN SODIUM 40 MG/0.4ML IJ SOSY
40.0000 mg | PREFILLED_SYRINGE | INTRAMUSCULAR | Status: DC
  Administered 2022-09-25 – 2022-09-29 (×5): 40 mg via SUBCUTANEOUS
  Filled 2022-09-25 (×5): qty 0.4

## 2022-09-25 MED ORDER — OXYCODONE HCL 5 MG PO TABS
2.5000 mg | ORAL_TABLET | ORAL | Status: DC | PRN
  Administered 2022-09-25 – 2022-09-26 (×3): 2.5 mg via ORAL
  Filled 2022-09-25 (×3): qty 1

## 2022-09-25 NOTE — Progress Notes (Signed)
Cardiology will be signing off.  Patient's follow-up has already been scheduled.  Patient has lab work to complete 1 week after date of discharge.

## 2022-09-25 NOTE — Progress Notes (Signed)
OT Cancellation Note  Patient Details Name: Erin Ingram MRN: [id number] DOB: Jun 04, 1940   Cancelled Treatment:    Reason Eval/Treat Not Completed: Patient declined, stating she would like to "just let go." She further expressed a potential interest in comfort care. PT updated care team of this.    Marella Bile, OTR/L 09/25/2022, 11:40 AM

## 2022-09-25 NOTE — Progress Notes (Signed)
PROGRESS NOTE  Erin Ingram UJW:[id number] DOB: November 12, 1940 DOA: 09/21/2022 PCP: Mast, Man X, NP   LOS: 4 days   Brief Narrative / Interim history: 82 year old with past medical history significant for microcytic anemia, osteoarthritis, stage III CKD, depression, GERD, hiatal hernia, hyperlipidemia, hypothyroidism, obesity, OSA on CPAP, polymyalgia rheumatica, history of multiple episode of pneumonia, subclavian artery stenosis, TIA, CAD, CABG chronic diastolic heart failure who presents from [facility] with progressive worsening shortness of breath, lower extremity edema, orthopnea cough, and found to be hypoxic oxygen as low as 86%.  Symptoms started a week ago. Patient admitted for acute hypoxic respiratory failure in the setting of acute diastolic heart failure exacerbation.  She was initially started on BiPAP .  Subjective / 24h Interval events: Feeling well, overall less motivated.  Wants to speak with palliative  Assesement and Plan: Principal Problem:   Acute on chronic diastolic heart failure (HCC) Active Problems:   Vitamin B12 deficiency anemia   Essential hypertension   PAD (peripheral artery disease) (HCC)   Hyperlipidemia LDL goal <70   CAD (coronary artery disease)   GERD (gastroesophageal reflux disease)   Hypothyroidism   Memory loss   Elevated troponin   Acute pulmonary edema (HCC)   HFrEF (heart failure with reduced ejection fraction) (HCC)   Shortness of breath   Goals of care, counseling/discussion   Palliative care encounter   High risk medication use   Need for emotional support   Counseling and coordination of care   Principal problem Acute on chronic combined systolic and diastolic heart failure exacerbation -Patient presented with worsening shortness of breath, lower extremity edema, chest x-ray with finding consistent with fluid overload and pery hilar interstitial and groundglass edema.  Developing small pleural effusion.   BNP at 1700, elevation of troponin -Cardiology consulted appreciate input -2D echo shows worsening LV function, cardiology recommending medical management.  Started Entresto, was placed on IV diuretics but now on oral Lasix -Clinically improved.  Continue goal-directed medical therapy  Active problems  Acute Hypoxic Respiratory Failure, In setting HF and presume COPD exacerbation. PNA - Reports history of smoking, but quit a long time ago.  Has been started on nebulizers, IV steroids, continue, transition IV steroids to oral today   CAD, elevation of troponin -Elevation of troponin in the setting of heart failure exacerbation.  Medical management per cardiology   Leukocytosis -Chest x-ray: With patchy opacities in the bases which may be due to additional edema or superimposed pneumonia.  On antibiotics   B12 deficiency anemia - Continue with B12 supplement.   Essential hypertension - Continue regimen as below   PAD - Continue with Lipitor and Plavix.   Hyperlipidemia - Continue with Lipitor   GERD - Continue with PPI   Hypothyroidism - Continue with Synthroid   Memory loss, dementia - Continue with Namenda   Polymyalgia Rheumatica - On Chronic prednisone 5 mg daily. Holding while on IV steroid.    Hypokalemia, Hypomagnesemia - replete and monitor   Scheduled Meds:  atorvastatin  10 mg Oral q1800   azithromycin  500 mg Oral Daily   budesonide (PULMICORT) nebulizer solution  0.25 mg Nebulization BID   Chlorhexidine Gluconate Cloth  6 each Topical Daily   clopidogrel  75 mg Oral Daily   cyanocobalamin  1,000 mcg Oral Daily   dapagliflozin propanediol  10 mg Oral Daily   enoxaparin (LOVENOX) injection  40 mg Subcutaneous Q24H   furosemide  20 mg Oral Daily  ipratropium-albuterol  3 mL Nebulization Q6H   levothyroxine  75 mcg Oral Q0600   memantine  10 mg Oral BID   metoCLOPramide  5 mg Oral BID   metoprolol succinate  25 mg Oral Daily   mirabegron ER  50 mg Oral Daily    mouth rinse  15 mL Mouth Rinse 4 times per day   pantoprazole  40 mg Oral BID   predniSONE  20 mg Oral Q breakfast   sacubitril-valsartan  1 tablet Oral BID   spironolactone  12.5 mg Oral Daily   traZODone  25 mg Oral QHS   venlafaxine XR  150 mg Oral Daily   Continuous Infusions:   PRN Meds:.acetaminophen **OR** acetaminophen, guaiFENesin-dextromethorphan, ipratropium-albuterol, nitroGLYCERIN, mouth rinse, oxyCODONE  Current Outpatient Medications  Medication Instructions   acetaminophen (TYLENOL) 1,000 mg, Oral, 2 times daily PRN   alum & mag hydroxide-simeth (MAALOX PLUS) 400-400-40 MG/5ML suspension 10 mLs, Oral, As needed   atorvastatin (LIPITOR) 10 MG tablet TAKE 1 TABLET ONCE DAILY.   Calcium-Phosphorus-Vitamin D (CALCIUM/VITAMIN D3/ADULT GUMMY) 250-100-500 MG-MG-UNIT CHEW 1 Piece of gum, Oral, 2 times daily   clopidogrel (PLAVIX) 75 MG tablet TAKE 1 TABLET ONCE DAILY.   COENZYME Q-10 PO 10 mg, Oral, Daily at bedtime   cyanocobalamin 1,000 mcg, Oral, Daily   diclofenac Sodium (VOLTAREN) 4 g, Topical, 4 times daily   docusate sodium (COLACE) 100 mg, Oral, Daily PRN   fluticasone (FLONASE) 50 MCG/ACT nasal spray 1 spray, Each Nare, Daily PRN   furosemide (LASIX) 20 mg, Oral, Daily   isosorbide mononitrate (IMDUR) 120 mg, Oral, Daily   ketoconazole (NIZORAL) 2 % shampoo 1 Application, Topical, 2 times weekly, Tuesday and Friday   levocetirizine (XYZAL) 5 MG tablet TAKE 1 TABLET ONCE DAILY IN THE EVENING.   levothyroxine (SYNTHROID) 75 MCG tablet TAKE 1 TABLET ONCE DAILY ON EMPTY STOMACH.   losartan (COZAAR) 12.5 mg, Oral, Daily   melatonin 1 mg, Oral, Daily at bedtime   memantine (NAMENDA) 10 mg, Oral, Nightly   Menthol, Topical Analgesic, (BIOFREEZE) 10 % LIQD 1 Application, Apply externally, As needed   methocarbamol (ROBAXIN) 750 mg, Oral, Every 6 hours PRN   metoCLOPramide (REGLAN) 5 mg, Oral, 2 times daily   metoprolol succinate (TOPROL-XL) 25 mg, Oral, Daily, Take with  or immediately following a meal.    miconazole (MICOTIN) 2 % powder Topical, 2 times daily,  Apply to groin and pannus topically   MYRBETRIQ 50 MG TB24 tablet TAKE 1 TABLET BY MOUTH DAILY.   nitroGLYCERIN (NITROSTAT) 0.4 mg, Sublingual, Every 5 min PRN, X 3 doses    pantoprazole (PROTONIX) 40 mg, Oral, 2 times daily   potassium chloride (MICRO-K) 10 MEQ CR capsule 20 mEq, Oral, 2 times daily   predniSONE (DELTASONE) 5 mg, Oral, Daily   PSYLLIUM HUSK PO 0.4 g, Oral, Daily, May keep at bedside    ranolazine (RANEXA) 1000 MG SR tablet TAKE 1 TABLET BY MOUTH TWICE DAILY.   venlafaxine (EFFEXOR) 150 mg, Oral, Daily   Vitamin D 2,000 Units, Oral, Daily   zinc oxide 20 % ointment 1 application , Topical, As needed    Diet Orders (From admission, onward)     Start     Ordered   09/21/22 0619  Diet Heart Room service appropriate? Yes; Fluid consistency: Thin  Diet effective now       Question Answer Comment  Room service appropriate? Yes   Fluid consistency: Thin      09/21/22 0618  DVT prophylaxis: enoxaparin (LOVENOX) injection 40 mg Start: 09/25/22 1000 SCDs Start: 09/21/22 1610   Lab Results  Component Value Date   PLT 350 09/25/2022      Code Status: DNR  Family Communication: No family at bedside  Status is: Inpatient Remains inpatient appropriate because: severity of illness  Level of care: Progressive  Consultants:  Cardiology   Objective: Vitals:   09/25/22 0447 09/25/22 0758 09/25/22 1401 09/25/22 1417  BP:   127/69   Pulse:   77   Resp:   19   Temp:   98.4 F (36.9 C)   TempSrc:   Oral   SpO2:  94% 93% 94%  Weight: 86 kg     Height:        Intake/Output Summary (Last 24 hours) at 09/25/2022 1424 Last data filed at 09/25/2022 0451 Gross per 24 hour  Intake 240 ml  Output 700 ml  Net -460 ml    Wt Readings from Last 3 Encounters:  09/25/22 86 kg  09/09/22 86.3 kg  09/03/22 86.2 kg    Examination:  Constitutional: NAD Eyes: lids  and conjunctivae normal, no scleral icterus ENMT: mmm Neck: normal, supple Respiratory: clear to auscultation bilaterally, no wheezing, no crackles. Normal respiratory effort.  Cardiovascular: Regular rate and rhythm, no murmurs / rubs / gallops. No LE edema. Abdomen: soft, no distention, no tenderness. Bowel sounds positive.   Data Reviewed: I have independently reviewed following labs and imaging studies   CBC Recent Labs  Lab 09/21/22 0423 09/22/22 0744 09/23/22 0323 09/24/22 0305 09/25/22 0339  WBC 16.0* 14.1* 16.9* 19.2* 19.9*  HGB 9.0* 9.3* 8.6* 9.6* 9.6*  HCT 29.1* 30.7* 28.5* 31.4* 31.8*  PLT 310 299 292 339 350  MCV 105.1* 105.9* 106.3* 105.4* 105.6*  MCH 32.5 32.1 32.1 32.2 31.9  MCHC 30.9 30.3 30.2 30.6 30.2  RDW 13.8 14.0 13.5 13.4 13.3  LYMPHSABS 1.7  --   --   --   --   MONOABS 1.2*  --   --   --   --   EOSABS 0.1  --   --   --   --   BASOSABS 0.0  --   --   --   --      Recent Labs  Lab 09/21/22 0423 09/21/22 0627 09/22/22 0744 09/22/22 1001 09/23/22 0323 09/24/22 0305 09/25/22 0339  NA 139  --  136  --  138 137 139  K 3.9  --  3.3*  --  4.7 3.5 3.7  CL 107  --  101  --  103 105 104  CO2 20*  --  23  --  25 22 24   GLUCOSE 122*  --  100*  --  122* 133* 103*  BUN 30*  --  29*  --  40* 46* 39*  CREATININE 1.35*  --  1.33*  --  1.44* 1.33* 1.13*  CALCIUM 7.8*  --  7.9*  --  8.1* 8.1* 8.7*  AST 15  --   --   --   --  15 15  ALT 13  --   --   --   --  12 12  ALKPHOS 37*  --   --   --   --  42 43  BILITOT 0.5  --   --   --   --  0.5 0.4  ALBUMIN 3.0*  --   --   --   --  2.7* 2.7*  MG  --  1.7  --   --  2.2 2.3 2.2  CRP  --   --   --  26.7*  --   --   --   PROCALCITON  --   --  0.11  --   --   --   --   BNP 1,723.8*  --   --   --   --   --   --      ------------------------------------------------------------------------------------------------------------------ No results for input(s): "CHOL", "HDL", "LDLCALC", "TRIG", "CHOLHDL", "LDLDIRECT" in  the last 72 hours.  Lab Results  Component Value Date   HGBA1C 5.1 02/14/2019   ------------------------------------------------------------------------------------------------------------------ No results for input(s): "TSH", "T4TOTAL", "T3FREE", "THYROIDAB" in the last 72 hours.  Invalid input(s): "FREET3"  Cardiac Enzymes No results for input(s): "CKMB", "TROPONINI", "MYOGLOBIN" in the last 168 hours.  Invalid input(s): "CK" ------------------------------------------------------------------------------------------------------------------    Component Value Date/Time   BNP 1,723.8 (H) 09/21/2022 0423    CBG: No results for input(s): "GLUCAP" in the last 168 hours.  Recent Results (from the past 240 hour(s))  Resp panel by RT-PCR (RSV, Flu A&B, Covid) Anterior Nasal Swab     Status: None   Collection Time: 09/21/22  4:23 AM   Specimen: Anterior Nasal Swab  Result Value Ref Range Status   SARS Coronavirus 2 by RT PCR NEGATIVE NEGATIVE Final    Comment: (NOTE) SARS-CoV-2 target nucleic acids are NOT DETECTED.  The SARS-CoV-2 RNA is generally detectable in upper respiratory specimens during the acute phase of infection. The lowest concentration of SARS-CoV-2 viral copies this assay can detect is 138 copies/mL. A negative result does not preclude SARS-Cov-2 infection and should not be used as the sole basis for treatment or other patient management decisions. A negative result may occur with  improper specimen collection/handling, submission of specimen other than nasopharyngeal swab, presence of viral mutation(s) within the areas targeted by this assay, and inadequate number of viral copies(<138 copies/mL). A negative result must be combined with clinical observations, patient history, and epidemiological information. The expected result is Negative.  Fact Sheet for Patients:  BloggerCourse.com  Fact Sheet for Healthcare Providers:   SeriousBroker.it  This test is no t yet approved or cleared by the [place] FDA and  has been authorized for detection and/or diagnosis of SARS-CoV-2 by FDA under an Emergency Use Authorization (EUA). This EUA will remain  in effect (meaning this test can be used) for the duration of the COVID-19 declaration under Section 564(b)(1) of the Act, 21 U.S.C.section 360bbb-3(b)(1), unless the authorization is terminated  or revoked sooner.       Influenza A by PCR NEGATIVE NEGATIVE Final   Influenza B by PCR NEGATIVE NEGATIVE Final    Comment: (NOTE) The Xpert Xpress SARS-CoV-2/FLU/RSV plus assay is intended as an aid in the diagnosis of influenza from Nasopharyngeal swab specimens and should not be used as a sole basis for treatment. Nasal washings and aspirates are unacceptable for Xpert Xpress SARS-CoV-2/FLU/RSV testing.  Fact Sheet for Patients: BloggerCourse.com  Fact Sheet for Healthcare Providers: SeriousBroker.it  This test is not yet approved or cleared by the [place] FDA and has been authorized for detection and/or diagnosis of SARS-CoV-2 by FDA under an Emergency Use Authorization (EUA). This EUA will remain in effect (meaning this test can be used) for the duration of the COVID-19 declaration under Section 564(b)(1) of the Act, 21 U.S.C. section 360bbb-3(b)(1), unless the authorization is terminated or revoked.     Resp Syncytial Virus by PCR NEGATIVE NEGATIVE  Final    Comment: (NOTE) Fact Sheet for Patients: BloggerCourse.com  Fact Sheet for Healthcare Providers: SeriousBroker.it  This test is not yet approved or cleared by the [place] FDA and has been authorized for detection and/or diagnosis of SARS-CoV-2 by FDA under an Emergency Use Authorization (EUA). This EUA will remain in effect (meaning this test can be used) for  the duration of the COVID-19 declaration under Section 564(b)(1) of the Act, 21 U.S.C. section 360bbb-3(b)(1), unless the authorization is terminated or revoked.  Performed at [facility], [address] [place], [place] [address]   MRSA Next Gen by PCR, Nasal     Status: None   Collection Time: 09/21/22 12:24 PM   Specimen: Nasal Mucosa; Nasal Swab  Result Value Ref Range Status   MRSA by PCR Next Gen NOT DETECTED NOT DETECTED Final    Comment: (NOTE) The GeneXpert MRSA Assay (FDA approved for NASAL specimens only), is one component of a comprehensive MRSA colonization surveillance program. It is not intended to diagnose MRSA infection nor to guide or monitor treatment for MRSA infections. Test performance is not FDA approved in patients less than 36 years old. Performed at [facility], [address] [place], [place] [address]      Radiology Studies: No results found.   Pamella Pert, MD, PhD [facility]  Between 7 am - 7 pm I am available, please contact me via Amion (for emergencies) or Securechat (non urgent messages)  Between 7 pm - 7 am I am not available, please contact night coverage MD/APP via Amion

## 2022-09-25 NOTE — Progress Notes (Signed)
PT Cancellation Note  Patient Details Name: MYIESHA LEBLEU MRN: [id number] DOB: [birth year]   Cancelled Treatment:    Reason Eval/Treat Not Completed: Other (comment) (pt declined all mobility with PT/OT, she stated she wants to pursue palliative care at this point. MD, RN, and TOC notified.)   Tamala Ser PT 09/25/2022  Acute Rehabilitation Services  Office [phone]

## 2022-09-25 NOTE — Consult Note (Signed)
Consultation Note Date: 09/25/2022   Patient Name: Erin Ingram  DOB: 04/03/1940  MRN: [id number]  Age / Sex: 82 y.o., female   PCP: Mast, Man X, NP Referring Physician: Leatha Gilding, MD  Reason for Consultation: Establishing goals of care     Chief Complaint/History of Present Illness:   Patient is an 82 year old female with a past medical history of microcytic anemia, osteoarthritis, stage III CKD, depression, GERD, hiatal hernia, hyperlipidemia, hypothyroidism, obesity, OSA on CPAP, polymyalgia rheumatica, multiple episodes of pneumonia, subclavian artery stenosis, TIA, CAD status post CABG, and CHF who was admitted on 09/21/2022 for management of worsening shortness of breath, lower extremity edema, and hypoxia.  Since being admitted patient has been managed for acute on chronic combined systolic and diastolic heart failure.  Echo during this admission has shown worsening LV dysfunction with a EF of 30-35%.  Cardiology consulted to assist with management.  Palliative medicine team consulted due to complex medical decision making at patient's request.  Extensive review of EMR prior to presenting to bedside.  When presenting to bedside, patient laying in bed with increased work of breathing noted.  Able to introduce her grandson who was at bedside.  Asked permission to continue with conversation which patient agreed with.  Introduced myself and the role of the palliative medicine team in patient's care.  Spent time learning about patient's medical journey at the until this time.  Patient explained how her husband had died under hospice care and that was a "beautiful death".  Patient is worried that she is going to keep being readmitted to the hospital time and time again without improvement and does not want that.  Patient wants to focus on quality time over quantity of time moving forward.  Patient is willing to consider being readmitted at this time though does not want repeat admissions  know if she has multiple medical conditions that cannot be reversed.  Spent time providing emotional support via active listening.   Then able to discuss similarities and differences between palliative medicine and hospice.  Discussed how patients who are continuing to seek aggressive medical interventions can continue with palliative medicine follow-up versus patient to want to focus on symptom management at the end of life are appropriate for hospice management when they have a prognosis of 6 months or less.  Able to discuss overall disease progression of CHF.  While attempting to stabilize CHF, it is a chronic condition which will inevitably worsen with time.  When becomes refractory to management, patient is appropriate for hospice admission for CHF.  Patient acknowledges this.  Discussed at this time improving patient's symptom management.  Patient notes that she feels miserable.  She notes she has been telling everyone she "feels better" because she knows that is what they want to hear.  Noted she should be honest about how she is feeling so we can appropriately manage her care moving forward.  Discussed starting low-dose oxycodone to assist with dyspnea management.  Patient agreeing with this plan.  Reviewed risk and benefits of medication.  Inquired about patient's hopes for care moving forward.  Patient noted she is willing to participate with rehab so noted she must continue participation in the hospital and not refuse there care and involvement.  Discussed that should patient feel that physical therapy is not benefiting her anymore, then could focus on her comfort moving forward and instead transition from palliative medicine to hospice.  Patient voiced appreciation for this information.  Patient hoping to have  conversation with her sons present to discuss all of her medical comorbidities.  Noted would be willing to assist in conversation.  With grandson present at bedside, able to coordinate  plan for family meeting tomorrow, 09/26/2022, at 11 AM.  This way 1 of patient's sons can be present in person and the other can be on speaker phone since he is in [place].  All questions answered at that time.  Thanked patient for allowing me to visit with her today.  Noted we will try to improve symptom management at this time with low-dose oxycodone which patient is agreeing with.  Updated IDT after discussion with patient.  Primary Diagnoses  Present on Admission:  Acute on chronic diastolic heart failure (HCC)  CAD (coronary artery disease)  Essential hypertension  GERD (gastroesophageal reflux disease)  Hyperlipidemia LDL goal <70  Hypothyroidism  PAD (peripheral artery disease) (HCC)  Elevated troponin  Memory loss  Vitamin B12 deficiency anemia   Palliative Review of Systems: Shortness of breath  Past Medical History:  Diagnosis Date   Anemia    Arthritis    "fingers, back, shoulders, hips" (04/10/2016)   Chronic diastolic CHF (congestive heart failure) (HCC)    a. normal EF, LVEDP at [facility] in 6/17 33 >> Lasix started    CKD (chronic kidney disease), stage III (HCC)    Coronary artery disease    a. s/p CABG 2000 (SVG/ free LIMA Y graft to the diagonal and distal LAD, SVG to the OM 1, and SVG to the PDA) . b. Cath 12/19/2014 90% dSVG to RCA s/p 2 overlapping DES. c. LHC 2016, 2017 no intervention except diuresis needed. d. Low risk nuc 03/2016. e. Cath 12/2017 patent LIMA-LAD, SVG-OM, SVG-PDA but occluded SVG to diag.    Depression    with anxious component   GERD (gastroesophageal reflux disease)    History of hiatal hernia    Hyperlipidemia    Hypothyroidism    Obesity    OSA on CPAP    PMR (polymyalgia rheumatica) (HCC)    Pneumonia    "2-3 times" (04/10/2016)   Stable angina    microvascular, improved with Ranexa   Subclavian artery stenosis (HCC)    a. L by cath note in 2016.   TIA (transient ischemic attack)    Social History   Socioeconomic History   Marital  status: Married    Spouse name: Not on file   Number of children: 2   Years of education: College   Highest education level: Not on file  Occupational History   Occupation: Retired Leisure centre manager  Tobacco Use   Smoking status: Former    Packs/day: 0.50    Years: 7.00    Additional pack years: 0.00    Total pack years: 3.50    Types: Cigarettes    Quit date: 08/21/1969    Years since quitting: 53.1   Smokeless tobacco: Never  Vaping Use   Vaping Use: Never used  Substance and Sexual Activity   Alcohol use: No    Alcohol/week: 0.0 standard drinks of alcohol    Comment: rare   Drug use: No   Sexual activity: Never  Other Topics Concern   Not on file  Social History Narrative   Social History     Social History Narrative       Lives in [facility] with husband.   Diet: Regular   Do you drink/eat things with caffeine? 1 cup caffeine per day.  Not much coffee  Marital status: Married                           What year were you married? 1964   Do you live in a house, apartment, assisted living, condo, trailer, etc)? Here at [facility]   Is it one or more stories?   How many persons live in your home? 2   Do you have any pets in your home? No   Current or past profession: Metallurgist (RN)   Do you exercise? I did                                                Type & how often: Swimming, Tai Chi, exercise classes   Do you have a living will? yes   Do you have a DNR Form? Yes   Do you have a POA/HPOA forms?             Social Determinants of Health   Financial Resource Strain: Low Risk  (12/07/2017)   Overall Financial Resource Strain (CARDIA)    Difficulty of Paying Living Expenses: Not hard at all  Food Insecurity: No Food Insecurity (09/21/2022)   Hunger Vital Sign    Worried About Running Out of Food in the Last Year: Never true    Ran Out of Food in the Last Year: Never true  Transportation Needs: No Transportation Needs (09/21/2022)   PRAPARE - Therapist, art (Medical): No    Lack of Transportation (Non-Medical): No  Physical Activity: Inactive (12/07/2017)   Exercise Vital Sign    Days of Exercise per Week: 0 days    Minutes of Exercise per Session: 0 min  Stress: Stress Concern Present (12/07/2017)   [employer] of Occupational Health - Occupational Stress Questionnaire    Feeling of Stress : To some extent  Social Connections: Somewhat Isolated (12/07/2017)   Social Connection and Isolation Panel [NHANES]    Frequency of Communication with Friends and Family: More than three times a week    Frequency of Social Gatherings with Friends and Family: More than three times a week    Attends Religious Services: Never    Database administrator or Organizations: No    Attends Engineer, structural: Never    Marital Status: Married   Family History  Problem Relation Age of Onset   Heart disease Mother    Heart attack Mother    Heart disease Father    Heart attack Father    Diabetes Brother    Pulmonary embolism Brother    Stroke Paternal Grandmother    Hypertension Neg Hx    Scheduled Meds:  atorvastatin  10 mg Oral q1800   azithromycin  500 mg Oral Daily   budesonide (PULMICORT) nebulizer solution  0.25 mg Nebulization BID   Chlorhexidine Gluconate Cloth  6 each Topical Daily   clopidogrel  75 mg Oral Daily   cyanocobalamin  1,000 mcg Oral Daily   dapagliflozin propanediol  10 mg Oral Daily   enoxaparin (LOVENOX) injection  40 mg Subcutaneous Q24H   furosemide  20 mg Oral Daily   ipratropium-albuterol  3 mL Nebulization Q6H   levothyroxine  75 mcg Oral Q0600   memantine  10 mg Oral BID   metoCLOPramide  5 mg Oral  BID   metoprolol succinate  25 mg Oral Daily   mirabegron ER  50 mg Oral Daily   mouth rinse  15 mL Mouth Rinse 4 times per day   pantoprazole  40 mg Oral BID   predniSONE  20 mg Oral Q breakfast   sacubitril-valsartan  1 tablet Oral BID   spironolactone  12.5 mg Oral Daily    venlafaxine XR  150 mg Oral Daily   Continuous Infusions: PRN Meds:.acetaminophen **OR** acetaminophen, guaiFENesin-dextromethorphan, ipratropium-albuterol, nitroGLYCERIN, mouth rinse, traMADol, traZODone Allergies  Allergen Reactions   Nsaids Other (See Comments)    Due to chronic kidney failure   Butorphanol Other (See Comments)    agitation Constipation Produced a lot of urine, made patient feel crazy   Sulfa Antibiotics Rash   Bisoprolol Fumarate Other (See Comments)    Doesn't remember   Butorphanol Tartrate Other (See Comments)    Produced a lot of urine, made patient feel crazy   Codeine Nausea And Vomiting   Demerol [Meperidine] Nausea And Vomiting   Imipramine     Sweating, facial dysfunction    Meperidine And Related Nausea And Vomiting   Statins     MYALGIAS   Tequin [Gatifloxacin] Other (See Comments)    Caused hypoglycemia Low blood sugar   Brilinta [Ticagrelor] Rash    CAUSES PETECHIAE, PURPURA   Pseudoephedrine Hcl Palpitations   Septra [Sulfamethoxazole-Trimethoprim] Rash   Sulfamethoxazole-Trimethoprim Rash   CBC:    Component Value Date/Time   WBC 19.9 (H) 09/25/2022 0339   HGB 9.6 (L) 09/25/2022 0339   HGB 11.3 (L) 08/26/2016 1513   HCT 31.8 (L) 09/25/2022 0339   HCT 34.9 08/26/2016 1513   PLT 350 09/25/2022 0339   PLT 251 08/26/2016 1513   MCV 105.6 (H) 09/25/2022 0339   MCV 94.3 08/26/2016 1513   NEUTROABS 12.9 (H) 09/21/2022 0423   NEUTROABS 8,747.00 12/05/2021 0000   NEUTROABS 15.3 (H) 08/26/2016 1513   LYMPHSABS 1.7 09/21/2022 0423   LYMPHSABS 1.6 08/26/2016 1513   MONOABS 1.2 (H) 09/21/2022 0423   MONOABS 0.9 08/26/2016 1513   EOSABS 0.1 09/21/2022 0423   EOSABS 0.1 08/26/2016 1513   BASOSABS 0.0 09/21/2022 0423   BASOSABS 0.0 08/26/2016 1513   Comprehensive Metabolic Panel:    Component Value Date/Time   NA 139 09/25/2022 0339   NA 140 05/28/2022 0000   NA 141 08/26/2016 1514   K 3.7 09/25/2022 0339   K 3.8 08/26/2016 1514    CL 104 09/25/2022 0339   CL 106 10/20/2018 0000   CO2 24 09/25/2022 0339   CO2 23 10/20/2018 0000   CO2 26 08/26/2016 1514   BUN 39 (H) 09/25/2022 0339   BUN 40 (A) 05/28/2022 0000   BUN 24.3 08/26/2016 1514   CREATININE 1.13 (H) 09/25/2022 0339   CREATININE 1.23 (H) 06/07/2018 0710   CREATININE 1.4 (H) 08/26/2016 1514   GLUCOSE 103 (H) 09/25/2022 0339   GLUCOSE 116 08/26/2016 1514   CALCIUM 8.7 (L) 09/25/2022 0339   CALCIUM 9.3 10/20/2018 0000   CALCIUM 9.6 08/26/2016 1514   AST 15 09/25/2022 0339   AST 10 08/26/2016 1514   ALT 12 09/25/2022 0339   ALT 11 08/26/2016 1514   ALKPHOS 43 09/25/2022 0339   ALKPHOS 56 08/26/2016 1514   BILITOT 0.4 09/25/2022 0339   BILITOT 0.30 08/26/2016 1514   PROT 5.9 (L) 09/25/2022 0339   PROT 5.9 10/20/2018 0000   PROT 7.0 08/26/2016 1514   ALBUMIN 2.7 (L) 09/25/2022 1610  ALBUMIN 3.6 10/20/2018 0000   ALBUMIN 3.7 08/26/2016 1514    Physical Exam: Vital Signs: BP (!) 107/54 (BP Location: Left Wrist)   Pulse 86   Temp 98.5 F (36.9 C) (Oral)   Resp 18   Ht 4\' 10"  (1.473 m)   Wt 86 kg   SpO2 94%   BMI 39.65 kg/m  SpO2: SpO2: 94 % O2 Device: O2 Device: Nasal Cannula O2 Flow Rate: O2 Flow Rate (L/min): 2 L/min Intake/output summary:  Intake/Output Summary (Last 24 hours) at 09/25/2022 1300 Last data filed at 09/25/2022 0451 Gross per 24 hour  Intake 740 ml  Output 1100 ml  Net -360 ml   LBM: Last BM Date : 09/24/22 Baseline Weight: Weight: 91.7 kg Most recent weight: Weight: 86 kg  General: NAD, alert,laying in bed, obese, chronically ill-appearing Eyes: No drainage noted HENT: moist mucous membranes Cardiovascular: RRR Respiratory: increased work of breathing noted with slight accessory muscle use, not in respiratory distress Skin: Ecchymoses present on upper extremities Neuro: A&Ox4, following commands easily Psych: appropriately answers all questions          Palliative Performance Scale: 40%              Additional  Data Reviewed: Recent Labs    09/24/22 0305 09/25/22 0339  WBC 19.2* 19.9*  HGB 9.6* 9.6*  PLT 339 350  NA 137 139  BUN 46* 39*  CREATININE 1.33* 1.13*    Imaging: ECHOCARDIOGRAM COMPLETE    ECHOCARDIOGRAM REPORT       Patient Name:   KYNNEDI DER Date of Exam: 09/22/2022 Medical Rec #:  [id number]        Height:       58.0 in Accession #:    [id number]       Weight:       188.5 lb Date of Birth:  [birth year]        BSA:          1.776 m Patient Age:    82 years         BP:           128/57 mmHg Patient Gender: F                HR:           52 bpm. Exam Location:  Inpatient  Procedure: 2D Echo, Cardiac Doppler, Color Doppler and Intracardiac            Opacification Agent  Indications:     CHF - Acute Diastolic   History:         Patient has prior history of Echocardiogram examinations, most                  recent 07/18/2018. CHF, CAD, Prior CABG, PAD, CKD and TIA,                  Signs/Symptoms:elevated troponin, Edema and Dyspnea; Risk                  Factors:Hypertension, Dyslipidemia, Sleep Apnea and Former                  Smoker.   Sonographer:     Wallie Char Referring Phys:  [id number] DAVID MANUEL ORTIZ Diagnosing Phys: Charlton Haws MD  IMPRESSIONS   1. Global hypokinesis worse in septum and apex. Left ventricular ejection fraction, by estimation, is 30 to 35%. The left ventricle has moderately decreased function. The left ventricle demonstrates  regional wall motion abnormalities (see scoring  diagram/findings for description). The left ventricular internal cavity size was moderately dilated. Left ventricular diastolic parameters are indeterminate.  2. Right ventricular systolic function is normal. The right ventricular size is normal.  3. The mitral valve is degenerative. No evidence of mitral valve regurgitation. No evidence of mitral stenosis. Severe mitral annular calcification.  4. The aortic valve is tricuspid. There is moderate calcification of the  aortic valve. There is moderate thickening of the aortic valve. Aortic valve regurgitation is not visualized. Mild aortic valve stenosis.  5. The inferior vena cava is normal in size with greater than 50% respiratory variability, suggesting right atrial pressure of 3 mmHg.  FINDINGS  Left Ventricle: Global hypokinesis worse in septum and apex. Left ventricular ejection fraction, by estimation, is 30 to 35%. The left ventricle has moderately decreased function. The left ventricle demonstrates regional wall motion abnormalities.  Definity contrast agent was given IV to delineate the left ventricular endocardial borders. The left ventricular internal cavity size was moderately dilated. There is no left ventricular hypertrophy. Left ventricular diastolic parameters are  indeterminate.  Right Ventricle: The right ventricular size is normal. No increase in right ventricular wall thickness. Right ventricular systolic function is normal.  Left Atrium: Left atrial size was normal in size.  Right Atrium: Right atrial size was normal in size.  Pericardium: There is no evidence of pericardial effusion.  Mitral Valve: The mitral valve is degenerative in appearance. There is mild thickening of the mitral valve leaflet(s). There is mild calcification of the mitral valve leaflet(s). Severe mitral annular calcification. No evidence of mitral valve  regurgitation. No evidence of mitral valve stenosis. MV peak gradient, 7.2 mmHg. The mean mitral valve gradient is 4.0 mmHg.  Tricuspid Valve: The tricuspid valve is normal in structure. Tricuspid valve regurgitation is mild . No evidence of tricuspid stenosis.  Aortic Valve: The aortic valve is tricuspid. There is moderate calcification of the aortic valve. There is moderate thickening of the aortic valve. Aortic valve regurgitation is not visualized. Mild aortic stenosis is present. Aortic valve mean gradient  measures 5.0 mmHg. Aortic valve peak gradient measures  8.0 mmHg. Aortic valve area, by VTI measures 1.73 cm.  Pulmonic Valve: The pulmonic valve was normal in structure. Pulmonic valve regurgitation is trivial. No evidence of pulmonic stenosis.  Aorta: The aortic root is normal in size and structure.  Venous: The inferior vena cava is normal in size with greater than 50% respiratory variability, suggesting right atrial pressure of 3 mmHg.  IAS/Shunts: No atrial level shunt detected by color flow Doppler.    LEFT VENTRICLE PLAX 2D LVIDd:         4.60 cm      Diastology LVIDs:         3.50 cm      LV e' medial:    4.10 cm/s LV PW:         0.90 cm      LV E/e' medial:  30.5 LV IVS:        0.80 cm      LV e' lateral:   6.19 cm/s LVOT diam:     1.70 cm      LV E/e' lateral: 20.2 LV SV:         48 LV SV Index:   27 LVOT Area:     2.27 cm   LV Volumes (MOD) LV vol d, MOD A2C: 126.0 ml LV vol d, MOD A4C: 126.0 ml LV  vol s, MOD A2C: 86.1 ml LV vol s, MOD A4C: 87.1 ml LV SV MOD A2C:     39.9 ml LV SV MOD A4C:     126.0 ml LV SV MOD BP:      40.1 ml  RIGHT VENTRICLE             IVC RV Basal diam:  3.50 cm     IVC diam: 1.90 cm RV S prime:     10.10 cm/s TAPSE (M-mode): 1.0 cm  LEFT ATRIUM             Index        RIGHT ATRIUM           Index LA diam:        3.20 cm 1.80 cm/m   RA Area:     16.20 cm LA Vol (A2C):   59.9 ml 33.73 ml/m  RA Volume:   43.40 ml  24.44 ml/m LA Vol (A4C):   63.9 ml 35.98 ml/m LA Biplane Vol: 61.9 ml 34.86 ml/m  AORTIC VALVE AV Area (Vmax):    1.69 cm AV Area (Vmean):   1.56 cm AV Area (VTI):     1.73 cm AV Vmax:           141.50 cm/s AV Vmean:          103.000 cm/s AV VTI:            0.278 m AV Peak Grad:      8.0 mmHg AV Mean Grad:      5.0 mmHg LVOT Vmax:         105.40 cm/s LVOT Vmean:        70.850 cm/s LVOT VTI:          0.212 m LVOT/AV VTI ratio: 0.76   AORTA Ao Root diam: 2.70 cm Ao Asc diam:  3.20 cm  MITRAL VALVE                TRICUSPID VALVE MV Area (PHT): 4.68 cm     TR  Peak grad:   56.0 mmHg MV Area VTI:   1.65 cm     TR Vmax:        374.00 cm/s MV Peak grad:  7.2 mmHg MV Mean grad:  4.0 mmHg     SHUNTS MV Vmax:       1.34 m/s     Systemic VTI:  0.21 m MV Vmean:      87.3 cm/s    Systemic Diam: 1.70 cm MV Decel Time: 162 msec MV E velocity: 125.00 cm/s MV A velocity: 133.00 cm/s MV E/A ratio:  0.94  Charlton Haws MD Electronically signed by Charlton Haws MD Signature Date/Time: 09/22/2022/11:24:09 AM      Final (Updated)      I personally reviewed recent imaging.   Palliative Care Assessment and Plan Summary of Established Goals of Care and Medical Treatment Preferences   Patient is an 82 year old female with a past medical history of microcytic anemia, osteoarthritis, stage III CKD, depression, GERD, hiatal hernia, hyperlipidemia, hypothyroidism, obesity, OSA on CPAP, polymyalgia rheumatica, multiple episodes of pneumonia, subclavian artery stenosis, TIA, CAD status post CABG, and CHF who was admitted on 09/21/2022 for management of worsening shortness of breath, lower extremity edema, and hypoxia.  Since being admitted patient has been managed for acute on chronic combined systolic and diastolic heart failure.  Echo during this admission has shown worsening LV dysfunction with a EF of 30-35%.  Cardiology  consulted to assist with management.  Palliative medicine team consulted due to complex medical decision making at patient's request.  # Complex medical decision making/goals of care  -Extensive conversation with patient as documented above in detail in HPI.  Able to discuss similarities and differences between palliative medicine and hospice.  Patient willing to work with physical therapy at this time and consider rehab though she does not want frequent hospital admissions for management of her heart failure symptoms.  Acknowledged this and noted option of outpatient palliative medicine follow-up and then transition to hospice when appropriate.  Patient  wanting this discussion to be had with her sons present as well also plan for family meeting at 11 AM tomorrow.  Updated IDT after visit with patient.  -ACP documentation on file reviewed.   -  Code Status: DNR   # Symptom management  -Dyspnea, in the setting of acute on chronic heart failure Patient feels that while fluid management has helped her overall symptoms, she still feels that she has increased work of breathing.  Discussed opioids as being therapeutic for managing dyspnea.  Patient agreeing to start low-dose opioid at this time to see if helps with quality of life.   -Start oxycodone 2.5 mg every 4 hours as needed.   -Discontinue tramadol due to potential Aes    -Insomnia Patient notes difficulty sleeping particularly while taking steroids.  Steroids already dosed for a.m.  Patient has as needed trazodone available.   -Scheduled trazodone 25 mg nightly.  May need to further increase dose based on patient's symptom response.  # Psycho-social/Spiritual Support:  - Support System: 2 sons, 4 grandchildren   # Discharge Planning:  To Be Determined  Thank you for allowing the palliative care team to participate in the care Silvio Clayman.  Alvester Morin, DO Palliative Care Provider PMT # [phone]  If patient remains symptomatic despite maximum doses, please call PMT at [phone] between 0700 and 1900. Outside of these hours, please call attending, as PMT does not have night coverage.  This provider spent a total of 81 minutes providing patient's care.  Includes review of EMR, discussing care with other staff members involved in patient's medical care, obtaining relevant history and information from patient and/or patient's family, and personal review of imaging and lab work. Greater than 50% of the time was spent counseling and coordinating care related to the above assessment and plan.    *Please note that this is a verbal dictation therefore any spelling or grammatical  errors are due to the "[employer]" system interpretation.

## 2022-09-25 NOTE — Progress Notes (Signed)
Rounding Note    Patient Name: Erin Ingram Date of Encounter: 09/25/2022  [facility] Cardiologist: Lance Muss, MD   Subjective   No CP or dyspnea  Inpatient Medications    Scheduled Meds:  atorvastatin  10 mg Oral q1800   azithromycin  500 mg Oral Daily   budesonide (PULMICORT) nebulizer solution  0.25 mg Nebulization BID   Chlorhexidine Gluconate Cloth  6 each Topical Daily   clopidogrel  75 mg Oral Daily   cyanocobalamin  1,000 mcg Oral Daily   dapagliflozin propanediol  10 mg Oral Daily   enoxaparin (LOVENOX) injection  40 mg Subcutaneous Q24H   furosemide  20 mg Oral Daily   ipratropium-albuterol  3 mL Nebulization Q6H   levothyroxine  75 mcg Oral Q0600   memantine  10 mg Oral BID   methylPREDNISolone (SOLU-MEDROL) injection  40 mg Intravenous Daily   metoCLOPramide  5 mg Oral BID   metoprolol succinate  25 mg Oral Daily   mirabegron ER  50 mg Oral Daily   mouth rinse  15 mL Mouth Rinse 4 times per day   pantoprazole  40 mg Oral BID   sacubitril-valsartan  1 tablet Oral BID   spironolactone  12.5 mg Oral Daily   venlafaxine XR  150 mg Oral Daily   Continuous Infusions:   PRN Meds: acetaminophen **OR** acetaminophen, guaiFENesin-dextromethorphan, ipratropium-albuterol, nitroGLYCERIN, mouth rinse, traZODone   Vital Signs    Vitals:   09/25/22 0345 09/25/22 0410 09/25/22 0447 09/25/22 0758  BP: (!) 107/54     Pulse: 86     Resp: 18     Temp: 98.5 F (36.9 C)     TempSrc: Oral     SpO2: 97% 96%  94%  Weight:   86 kg   Height:        Intake/Output Summary (Last 24 hours) at 09/25/2022 0908 Last data filed at 09/25/2022 0451 Gross per 24 hour  Intake 740 ml  Output 1100 ml  Net -360 ml       09/25/2022    4:47 AM 09/24/2022    3:57 AM 09/23/2022    5:00 AM  Last 3 Weights  Weight (lbs) 189 lb 11.2 oz 184 lb 1.4 oz 188 lb 7.9 oz  Weight (kg) 86.047 kg 83.5 kg 85.5 kg      Telemetry     Sinus- Personally Reviewed   Physical  Exam   GEN: NAD Neck: supple Cardiac: RRR, no rub Respiratory: Diminished breath sounds; no rhonchi GI: Soft, NT/ND MS: No edema Neuro:  Grossly intact Psych: Normal affect   Labs    High Sensitivity Troponin:   Recent Labs  Lab 09/21/22 0423 09/21/22 0627  TROPONINIHS 278* 270*      Chemistry Recent Labs  Lab 09/21/22 0423 09/21/22 0627 09/23/22 0323 09/24/22 0305 09/25/22 0339  NA 139   < > 138 137 139  K 3.9   < > 4.7 3.5 3.7  CL 107   < > 103 105 104  CO2 20*   < > 25 22 24   GLUCOSE 122*   < > 122* 133* 103*  BUN 30*   < > 40* 46* 39*  CREATININE 1.35*   < > 1.44* 1.33* 1.13*  CALCIUM 7.8*   < > 8.1* 8.1* 8.7*  MG  --    < > 2.2 2.3 2.2  PROT 6.1*  --   --  5.8* 5.9*  ALBUMIN 3.0*  --   --  2.7* 2.7*  AST 15  --   --  15 15  ALT 13  --   --  12 12  ALKPHOS 37*  --   --  42 43  BILITOT 0.5  --   --  0.5 0.4  GFRNONAA 39*   < > 36* 40* 48*  ANIONGAP 12   < > 10 10 11    < > = values in this interval not displayed.      Hematology Recent Labs  Lab 09/23/22 0323 09/24/22 0305 09/25/22 0339  WBC 16.9* 19.2* 19.9*  RBC 2.68* 2.98* 3.01*  HGB 8.6* 9.6* 9.6*  HCT 28.5* 31.4* 31.8*  MCV 106.3* 105.4* 105.6*  MCH 32.1 32.2 31.9  MCHC 30.2 30.6 30.2  RDW 13.5 13.4 13.3  PLT 292 339 350     BNP Recent Labs  Lab 09/21/22 0423  BNP 1,723.8*       Patient Profile     82 year old female with past medical history of coronary artery disease status post coronary bypass and graft, chronic diastolic congestive heart failure, prior TIA, obstructive sleep apnea, hypertension, hyperlipidemia, chronic stage IIIa kidney disease, subclavian stenosis for evaluation of acute on chronic diastolic congestive heart failure. Last echocardiogram April 2020 showed normal LV function.  Echocardiogram this admission shows ejection fraction 30 to 35% worse in the septum and apex, moderate left ventricular enlargement and mild aortic stenosis with mean gradient 5 mmHg and  aortic valve area 1.73 cm.  Assessment & Plan    1 acute on chronic combined systolic/diastolic congestive heart failure-I/O-6018.  Wt 86 kg.  Patient's volume status has improved.  Will continue Lasix, spironolactone and Farxiga at present dose.  Needs potassium and renal function checked in 1 week.     2 cardiomyopathy-LV function reduced compared to previous.  However she has multiple comorbidities and is a no CODE BLUE.  I would favor medical therapy for now.  Continue Entresto, Toprol, Farxiga and spironolactone.  Would plan to repeat echocardiogram in 3 months.  If LV function remains decreased could consider ischemia evaluation at that point.   3 coronary artery disease-continue Plavix and statin.  Ranolazine has been discontinued.   4 hyperlipidemia-continue statin.   5 hypertension-blood pressure is controlled.  Continue present medications.   6 chronic stage IIIa kidney disease-will need to check potassium and renal function in 1 week.   For questions or updates, please contact [facility] Please consult [email or web address] for contact info under        Signed, Olga Millers, MD  09/25/2022, 9:08 AM

## 2022-09-25 NOTE — Telephone Encounter (Signed)
   Transition of Care Follow-up Phone Call Request    Patient Name: CORIENE LANCIA Date of Birth: Nov 26, 1940 Date of Encounter: 09/25/2022  Primary Care Provider:  Mast, Man X, NP Primary Cardiologist:  Lance Muss, MD  Silvio Clayman has been scheduled for a transition of care follow up appointment with a [facility] provider:  Thursday Oct 08, 2022  Arrive by 8:10 AMAppt at 8:25 AM (25 min)   With Ronie Spies.  Patient has lab work to complete 1 week after discharge date.  Still currently admitted however anticipate discharge relatively soon.  Please discuss with patient how to complete lab work and whether appointment is necessary or not.  Please reach out to Silvio Clayman within 48 hours to confirm appointment and review transition of care protocol questionnaire.  Abagail Kitchens, PA-C  09/25/2022, 11:26 AM

## 2022-09-25 NOTE — TOC Progression Note (Signed)
Transition of Care [facility]) - Progression Note    Patient Details  Name: Erin Ingram MRN: [id number] Date of Birth: 05/05/1940  Transition [facility]) CM/SW Contact  Adrian Prows, RN Phone Number: 09/25/2022, 1:35 PM  Clinical Narrative:    Notified pt would "wants to just let go" and to speak w/ palliative care; spoke w/ pt and grandson in room; pt says she will still go to SNF but she would like talk w/ palliative care about her options; spoke w/ Dawn at [facility]; she says Dr Patterson Hammersmith is assigned to the pt and will see her today or tomorrow.   Expected Discharge Plan: Assisted Living Barriers to Discharge: Continued Medical Work up  Expected Discharge Plan and Services In-house Referral: NA Discharge Planning Services: NA   Living arrangements for the past 2 months: Assisted Living Facility                                       Social Determinants of Health (SDOH) Interventions SDOH Screenings   Food Insecurity: No Food Insecurity (09/21/2022)  Housing: Low Risk  (09/21/2022)  Transportation Needs: No Transportation Needs (09/21/2022)  Utilities: Not At Risk (09/21/2022)  Financial Resource Strain: Low Risk  (12/07/2017)  Physical Activity: Inactive (12/07/2017)  Social Connections: Somewhat Isolated (12/07/2017)  Stress: Stress Concern Present (12/07/2017)  Tobacco Use: Medium Risk (09/21/2022)    Readmission Risk Interventions    09/22/2022    1:02 PM  Readmission Risk Prevention Plan  Transportation Screening Complete  HRI or Home Care Consult Complete  Social Work Consult for Recovery Care Planning/Counseling Complete  Palliative Care Screening Not Applicable

## 2022-09-26 DIAGNOSIS — Z7189 Other specified counseling: Secondary | ICD-10-CM | POA: Diagnosis not present

## 2022-09-26 DIAGNOSIS — J810 Acute pulmonary edema: Secondary | ICD-10-CM | POA: Diagnosis not present

## 2022-09-26 DIAGNOSIS — Z515 Encounter for palliative care: Secondary | ICD-10-CM | POA: Diagnosis not present

## 2022-09-26 DIAGNOSIS — I5020 Unspecified systolic (congestive) heart failure: Secondary | ICD-10-CM | POA: Diagnosis not present

## 2022-09-26 DIAGNOSIS — I5033 Acute on chronic diastolic (congestive) heart failure: Secondary | ICD-10-CM | POA: Diagnosis not present

## 2022-09-26 LAB — CBC
HCT: 30.5 % — ABNORMAL LOW (ref 36.0–46.0)
Hemoglobin: 9.5 g/dL — ABNORMAL LOW (ref 12.0–15.0)
MCH: 32.5 pg (ref 26.0–34.0)
MCHC: 31.1 g/dL (ref 30.0–36.0)
MCV: 104.5 fL — ABNORMAL HIGH (ref 80.0–100.0)
Platelets: 319 10*3/uL (ref 150–400)
RBC: 2.92 MIL/uL — ABNORMAL LOW (ref 3.87–5.11)
RDW: 13.3 % (ref 11.5–15.5)
WBC: 15.9 10*3/uL — ABNORMAL HIGH (ref 4.0–10.5)
nRBC: 0 % (ref 0.0–0.2)

## 2022-09-26 LAB — BASIC METABOLIC PANEL
Anion gap: 9 (ref 5–15)
BUN: 32 mg/dL — ABNORMAL HIGH (ref 8–23)
CO2: 24 mmol/L (ref 22–32)
Calcium: 9 mg/dL (ref 8.9–10.3)
Chloride: 105 mmol/L (ref 98–111)
Creatinine, Ser: 1.13 mg/dL — ABNORMAL HIGH (ref 0.44–1.00)
GFR, Estimated: 48 mL/min — ABNORMAL LOW (ref >60)
Glucose, Bld: 159 mg/dL — ABNORMAL HIGH (ref 70–99)
Potassium: 3.9 mmol/L (ref 3.5–5.1)
Sodium: 138 mmol/L (ref 135–145)

## 2022-09-26 LAB — MAGNESIUM: Magnesium: 1.9 mg/dL (ref 1.7–2.4)

## 2022-09-26 MED ORDER — FUROSEMIDE 10 MG/ML IJ SOLN
40.0000 mg | Freq: Once | INTRAMUSCULAR | Status: AC
  Administered 2022-09-26: 40 mg via INTRAVENOUS
  Filled 2022-09-26: qty 4

## 2022-09-26 MED ORDER — OXYCODONE HCL 5 MG PO TABS
5.0000 mg | ORAL_TABLET | ORAL | Status: DC | PRN
  Administered 2022-09-26 – 2022-10-01 (×5): 5 mg via ORAL
  Filled 2022-09-26 (×6): qty 1

## 2022-09-26 NOTE — Progress Notes (Signed)
Daily Progress Note   Patient Name: Erin Ingram       Date: 09/26/2022 DOB: [birth year]  Age: 82 y.o. MRN#: [id number] Attending Physician: Erin Gilding, MD Primary Care Physician: Mast, Man X, NP Admit Date: 09/21/2022 Length of Stay: 5 days  Reason for Consultation/Follow-up: Establishing goals of care  Subjective:   CC: Following up regarding family meeting patient requested.   Subjective:  Had met with patient yesterday and plan for family meeting today at 77 AM so patient's sons be involved in conversation.  Presented to bedside at time of family meeting.  Patient laying comfortably in bed with slightly increased work of breathing noted.  Patient's son at bedside while 1 other son over speaker phone.  Patient's grandson also present at bedside.  Introduced myself and the role of the palliative medicine team in patient's care.  At patient's request, spent time discussing similarities and differences between palliative medicine and hospice.  Discussed possible pathways for medical care moving forward.  Patient can actively voice that she is tired and she wants to focus on being comfortable.  Family at this time wanting patient to participate in rehab to determine if she can return to assisted living and have more quality time with them.  Patient willing to participate with PT/OT at this time and go to rehab with palliative medicine follow-up.  Discussed that should patient deteriorate further medically, the could then transition to hospice care.  Spent time answering questions regarding this.    Also able to follow-up on patient's symptom burden.  Patient does feel that the oxycodone greatly assist with her shortness of breath sensation.  Patient does feel that it could be slightly better and the medication could last longer.  Discussed increasing oxycodone to 5 mg every 4 hours as needed.  Patient and family agreeing with this plan.  Patient denies any adverse effects from oxycodone.    Patient also notes that she slept much better last night with scheduled dose of trazodone.  Noted this could be further increased in the future if needed.    All questions answered at that time.  Thanked patient and family for allowing me to visit with him today.  Updated IDT after visit.   Review of Systems Breathing improved.  Objective:   Vital Signs:  BP 133/72 (BP Location: Right Arm)   Pulse 74   Temp 97.9 F (36.6 C) (Oral)   Resp 18   Ht 4\' 10"  (1.473 m)   Wt 84.5 kg   SpO2 95%   BMI 38.93 kg/m   Physical Exam: General: NAD, alert,laying in bed, obese, chronically ill-appearing Eyes: No drainage noted HENT: moist mucous membranes Cardiovascular: RRR Respiratory: slightly increased work of breathing noted with slight accessory muscle use, not in respiratory distress Skin: Ecchymoses present on upper extremities Neuro: A&Ox4, following commands easily Psych: appropriately answers all questions  Imaging:  I personally reviewed recent imaging.   Assessment & Plan:   Assessment: Patient is an 82 year old female with a past medical history of microcytic anemia, osteoarthritis, stage III CKD, depression, GERD, hiatal hernia, hyperlipidemia, hypothyroidism, obesity, OSA on CPAP, polymyalgia rheumatica, multiple episodes of pneumonia, subclavian artery stenosis, TIA, CAD status post CABG, and CHF who was admitted on 09/21/2022 for management of worsening shortness of breath, lower extremity edema, and hypoxia.  Since being admitted patient has been managed for acute on chronic combined systolic and diastolic heart failure.  Echo during this admission has shown worsening LV dysfunction with a EF  of 30-35%.  Cardiology consulted to assist with management.  Palliative medicine team consulted due to complex medical decision making at patient's request.   Recommendations/Plan: # Complex medical decision making/goals of care:    -Extensive conversation with patient and patient's  son at bedside.  Explained in detail differences and similarities between palliative medicine and hospice.  While patient wanting to focus more on comfort care though willing to attempt rehab 1 more time at family's request.  Discussed outpatient follow-up at rehab with [facility] lower care palliative medicine group and then transition to hospice if becomes appropriate.  Patient and family agreeing with this plan.                -ACP documentation on file reviewed.                 -  Code Status: DNR    # Symptom management                -Dyspnea, in the setting of acute on chronic heart failure Patient feels oxycodone does assist with shortness of breath management though not quite enough and doesn't last long enough. Denies Aes. Will increase dose accordingly.                                -Increase oxycodone to 5 mg every 4 hours as needed.                               -Discontinue tramadol due to potential Aes                   -Insomnia Patient notes sleeping improved with scheduled trazodone.                               -Continue trazodone 25 mg nightly.     # Psycho-social/Spiritual Support:  - Support System: 2 sons, 4 grandchildren    # Discharge Planning: Skilled Nursing Facility for rehab with Palliative care service follow-up   Discussed with: patient, patient's sons, TOC, hospitalist, [facility] liaison, RN  Thank you for allowing the palliative care team to participate in the care Silvio Clayman.  Alvester Morin, DO Palliative Care Provider PMT # [phone]  If patient remains symptomatic despite maximum doses, please call PMT at [phone] between 0700 and 1900. Outside of these hours, please call attending, as PMT does not have night coverage.  This provider spent a total of 51 minutes providing patient's care.  Includes review of EMR, discussing care with other staff members involved in patient's medical care, obtaining relevant history and information from patient  and/or patient's family, and personal review of imaging and lab work. Greater than 50% of the time was spent counseling and coordinating care related to the above assessment and plan.    *Please note that this is a verbal dictation therefore any spelling or grammatical errors are due to the "[employer]" system interpretation.

## 2022-09-26 NOTE — TOC Progression Note (Signed)
Transition of Care [facility]) - Progression Note    Patient Details  Name: Erin Ingram MRN: [id number] Date of Birth: 04/12/1940  Transition [facility] Care [facility]) CM/SW Contact  Carmina Miller, [place] Phone Number: 09/26/2022, 12:38 PM  Clinical Narrative:     CSW received chat from treatment team, pt and family have decided to try STR at [facility]. CSW attempted to reach staff at [facility] to confirm a bed for STR, vm left for RN on [place] where pt resides. CSW initiated auth with [employer] with a start date of Monday-ref id [phone].    Expected Discharge Plan: Assisted Living Barriers to Discharge: Continued Medical Work up  Expected Discharge Plan and Services In-house Referral: NA Discharge Planning Services: NA   Living arrangements for the past 2 months: Assisted Living Facility                                       Social Determinants of Health (SDOH) Interventions SDOH Screenings   Food Insecurity: No Food Insecurity (09/21/2022)  Housing: Low Risk  (09/21/2022)  Transportation Needs: No Transportation Needs (09/21/2022)  Utilities: Not At Risk (09/21/2022)  Financial Resource Strain: Low Risk  (12/07/2017)  Physical Activity: Inactive (12/07/2017)  Social Connections: Somewhat Isolated (12/07/2017)  Stress: Stress Concern Present (12/07/2017)  Tobacco Use: Medium Risk (09/21/2022)    Readmission Risk Interventions    09/22/2022    1:02 PM  Readmission Risk Prevention Plan  Transportation Screening Complete  HRI or Home Care Consult Complete  Social Work Consult for Recovery Care Planning/Counseling Complete  Palliative Care Screening Not Applicable

## 2022-09-26 NOTE — Progress Notes (Signed)
   09/25/22 2037  BiPAP/CPAP/SIPAP  Reason BIPAP/CPAP not in use Non-compliant (Pt. stated she has not been using CPAP recently, has been removed from room previously, has been made aware to notify if wanting set up.)  BiPAP/CPAP /SiPAP Vitals  SpO2 94 %  Bilateral Breath Sounds Diminished;Expiratory wheezes (>'d upper airway)

## 2022-09-26 NOTE — Progress Notes (Signed)
PROGRESS NOTE  CAELY MEDICO WGN:[id number] DOB: 1941-02-23 DOA: 09/21/2022 PCP: Mast, Man X, NP   LOS: 5 days   Brief Narrative / Interim history: 82 year old with past medical history significant for microcytic anemia, osteoarthritis, stage III CKD, depression, GERD, hiatal hernia, hyperlipidemia, hypothyroidism, obesity, OSA on CPAP, polymyalgia rheumatica, history of multiple episode of pneumonia, subclavian artery stenosis, TIA, CAD, CABG chronic diastolic heart failure who presents from [facility] home assisted living facility with progressive worsening shortness of breath, lower extremity edema, orthopnea cough, and found to be hypoxic oxygen as low as 86%.  Symptoms started a week ago. Patient admitted for acute hypoxic respiratory failure in the setting of acute diastolic heart failure exacerbation.  She was initially started on BiPAP .  Subjective / 24h Interval events: Still with ongoing intermittent dyspnea  Assesement and Plan: Principal Problem:   Acute on chronic diastolic heart failure (HCC) Active Problems:   Vitamin B12 deficiency anemia   Essential hypertension   PAD (peripheral artery disease) (HCC)   Hyperlipidemia LDL goal <70   CAD (coronary artery disease)   GERD (gastroesophageal reflux disease)   Hypothyroidism   Memory loss   Elevated troponin   Acute pulmonary edema (HCC)   HFrEF (heart failure with reduced ejection fraction) (HCC)   Shortness of breath   Goals of care, counseling/discussion   Palliative care encounter   High risk medication use   Need for emotional support   Counseling and coordination of care   Principal problem Acute on chronic combined systolic and diastolic heart failure exacerbation -Patient presented with worsening shortness of breath, lower extremity edema, chest x-ray with finding consistent with fluid overload and pery hilar interstitial and groundglass edema.  Developing small pleural effusion.  BNP at 1700, elevation of  troponin -Cardiology consulted appreciate input -2D echo shows worsening LV function, cardiology recommending medical management.  Started Entresto, was placed on IV diuretics but now on oral Lasix -Clinically improved.  Continue goal-directed medical therapy -Will do an extra iv Lasix x 1 today due to persistent dyspnea  Active problems  Acute Hypoxic Respiratory Failure, In setting HF and presume COPD exacerbation. PNA - Reports history of smoking, but quit a long time ago.  Has been started on nebulizers, IV steroids, continue, continue steroids, will need to taper   CAD, elevation of troponin -Elevation of troponin in the setting of heart failure exacerbation.  Medical management per cardiology   Leukocytosis -Chest x-ray: With patchy opacities in the bases which may be due to additional edema or superimposed pneumonia.  On antibiotics, plan for total of 5 days   B12 deficiency anemia - Continue with B12 supplement.   Essential hypertension - Continue regimen as below   PAD - Continue with Lipitor and Plavix.   Hyperlipidemia - Continue with Lipitor   GERD - Continue with PPI   Hypothyroidism - Continue with Synthroid   Memory loss, dementia - Continue with Namenda   Polymyalgia Rheumatica - On Chronic prednisone 5 mg daily. Holding while on IV steroid.    Hypokalemia, Hypomagnesemia - replete and monitor   Scheduled Meds:  atorvastatin  10 mg Oral q1800   azithromycin  500 mg Oral Daily   budesonide (PULMICORT) nebulizer solution  0.25 mg Nebulization BID   Chlorhexidine Gluconate Cloth  6 each Topical Daily   clopidogrel  75 mg Oral Daily   cyanocobalamin  1,000 mcg Oral Daily   dapagliflozin propanediol  10 mg Oral Daily   enoxaparin (LOVENOX) injection  40  mg Subcutaneous Q24H   furosemide  20 mg Oral Daily   ipratropium-albuterol  3 mL Nebulization Q6H   levothyroxine  75 mcg Oral Q0600   memantine  10 mg Oral BID   metoCLOPramide  5 mg Oral BID   metoprolol  succinate  25 mg Oral Daily   mirabegron ER  50 mg Oral Daily   mouth rinse  15 mL Mouth Rinse 4 times per day   pantoprazole  40 mg Oral BID   predniSONE  20 mg Oral Q breakfast   sacubitril-valsartan  1 tablet Oral BID   spironolactone  12.5 mg Oral Daily   traZODone  25 mg Oral QHS   venlafaxine XR  150 mg Oral Daily   Continuous Infusions:   PRN Meds:.acetaminophen **OR** acetaminophen, guaiFENesin-dextromethorphan, ipratropium-albuterol, loperamide, nitroGLYCERIN, mouth rinse, oxyCODONE  Current Outpatient Medications  Medication Instructions   acetaminophen (TYLENOL) 1,000 mg, Oral, 2 times daily PRN   alum & mag hydroxide-simeth (MAALOX PLUS) 400-400-40 MG/5ML suspension 10 mLs, Oral, As needed   atorvastatin (LIPITOR) 10 MG tablet TAKE 1 TABLET ONCE DAILY.   Calcium-Phosphorus-Vitamin D (CALCIUM/VITAMIN D3/ADULT GUMMY) 250-100-500 MG-MG-UNIT CHEW 1 Piece of gum, Oral, 2 times daily   clopidogrel (PLAVIX) 75 MG tablet TAKE 1 TABLET ONCE DAILY.   COENZYME Q-10 PO 10 mg, Oral, Daily at bedtime   cyanocobalamin 1,000 mcg, Oral, Daily   diclofenac Sodium (VOLTAREN) 4 g, Topical, 4 times daily   docusate sodium (COLACE) 100 mg, Oral, Daily PRN   fluticasone (FLONASE) 50 MCG/ACT nasal spray 1 spray, Each Nare, Daily PRN   furosemide (LASIX) 20 mg, Oral, Daily   isosorbide mononitrate (IMDUR) 120 mg, Oral, Daily   ketoconazole (NIZORAL) 2 % shampoo 1 Application, Topical, 2 times weekly, Tuesday and Friday   levocetirizine (XYZAL) 5 MG tablet TAKE 1 TABLET ONCE DAILY IN THE EVENING.   levothyroxine (SYNTHROID) 75 MCG tablet TAKE 1 TABLET ONCE DAILY ON EMPTY STOMACH.   losartan (COZAAR) 12.5 mg, Oral, Daily   melatonin 1 mg, Oral, Daily at bedtime   memantine (NAMENDA) 10 mg, Oral, Nightly   Menthol, Topical Analgesic, (BIOFREEZE) 10 % LIQD 1 Application, Apply externally, As needed   methocarbamol (ROBAXIN) 750 mg, Oral, Every 6 hours PRN   metoCLOPramide (REGLAN) 5 mg, Oral, 2  times daily   metoprolol succinate (TOPROL-XL) 25 mg, Oral, Daily, Take with or immediately following a meal.    miconazole (MICOTIN) 2 % powder Topical, 2 times daily,  Apply to groin and pannus topically   MYRBETRIQ 50 MG TB24 tablet TAKE 1 TABLET BY MOUTH DAILY.   nitroGLYCERIN (NITROSTAT) 0.4 mg, Sublingual, Every 5 min PRN, X 3 doses    pantoprazole (PROTONIX) 40 mg, Oral, 2 times daily   potassium chloride (MICRO-K) 10 MEQ CR capsule 20 mEq, Oral, 2 times daily   predniSONE (DELTASONE) 5 mg, Oral, Daily   PSYLLIUM HUSK PO 0.4 g, Oral, Daily, May keep at bedside    ranolazine (RANEXA) 1000 MG SR tablet TAKE 1 TABLET BY MOUTH TWICE DAILY.   venlafaxine (EFFEXOR) 150 mg, Oral, Daily   Vitamin D 2,000 Units, Oral, Daily   zinc oxide 20 % ointment 1 application , Topical, As needed    Diet Orders (From admission, onward)     Start     Ordered   09/21/22 0619  Diet Heart Room service appropriate? Yes; Fluid consistency: Thin  Diet effective now       Question Answer Comment  Room service appropriate? Yes  Fluid consistency: Thin      09/21/22 0618            DVT prophylaxis: enoxaparin (LOVENOX) injection 40 mg Start: 09/25/22 1000 SCDs Start: 09/21/22 0981   Lab Results  Component Value Date   PLT 319 09/26/2022      Code Status: DNR  Family Communication: No family at bedside  Status is: Inpatient Remains inpatient appropriate because: severity of illness  Level of care: Progressive  Consultants:  Cardiology   Objective: Vitals:   09/25/22 2037 09/26/22 0245 09/26/22 0558 09/26/22 0757  BP:   133/72   Pulse:   74   Resp:   18   Temp:   97.9 F (36.6 C)   TempSrc:   Oral   SpO2: 94% 92% 93% 95%  Weight:   84.5 kg   Height:        Intake/Output Summary (Last 24 hours) at 09/26/2022 1200 Last data filed at 09/26/2022 0500 Gross per 24 hour  Intake 180 ml  Output 200 ml  Net -20 ml    Wt Readings from Last 3 Encounters:  09/26/22 84.5 kg   09/09/22 86.3 kg  09/03/22 86.2 kg    Examination:  Constitutional: NAD Eyes: lids and conjunctivae normal, no scleral icterus ENMT: mmm Neck: normal, supple Respiratory: Diminished breath laterally, but no wheezing heard Cardiovascular: Regular rate and rhythm, no murmurs / rubs / gallops. No LE edema. Abdomen: soft, no distention, no tenderness. Bowel sounds positive.   Data Reviewed: I have independently reviewed following labs and imaging studies   CBC Recent Labs  Lab 09/21/22 0423 09/22/22 0744 09/23/22 0323 09/24/22 0305 09/25/22 0339 09/26/22 0338  WBC 16.0* 14.1* 16.9* 19.2* 19.9* 15.9*  HGB 9.0* 9.3* 8.6* 9.6* 9.6* 9.5*  HCT 29.1* 30.7* 28.5* 31.4* 31.8* 30.5*  PLT 310 299 292 339 350 319  MCV 105.1* 105.9* 106.3* 105.4* 105.6* 104.5*  MCH 32.5 32.1 32.1 32.2 31.9 32.5  MCHC 30.9 30.3 30.2 30.6 30.2 31.1  RDW 13.8 14.0 13.5 13.4 13.3 13.3  LYMPHSABS 1.7  --   --   --   --   --   MONOABS 1.2*  --   --   --   --   --   EOSABS 0.1  --   --   --   --   --   BASOSABS 0.0  --   --   --   --   --      Recent Labs  Lab 09/21/22 0423 09/21/22 0627 09/22/22 0744 09/22/22 1001 09/23/22 0323 09/24/22 0305 09/25/22 0339 09/26/22 0338  NA 139  --  136  --  138 137 139 138  K 3.9  --  3.3*  --  4.7 3.5 3.7 3.9  CL 107  --  101  --  103 105 104 105  CO2 20*  --  23  --  25 22 24 24   GLUCOSE 122*  --  100*  --  122* 133* 103* 159*  BUN 30*  --  29*  --  40* 46* 39* 32*  CREATININE 1.35*  --  1.33*  --  1.44* 1.33* 1.13* 1.13*  CALCIUM 7.8*  --  7.9*  --  8.1* 8.1* 8.7* 9.0  AST 15  --   --   --   --  15 15  --   ALT 13  --   --   --   --  12 12  --  ALKPHOS 37*  --   --   --   --  42 43  --   BILITOT 0.5  --   --   --   --  0.5 0.4  --   ALBUMIN 3.0*  --   --   --   --  2.7* 2.7*  --   MG  --  1.7  --   --  2.2 2.3 2.2 1.9  CRP  --   --   --  26.7*  --   --   --   --   PROCALCITON  --   --  0.11  --   --   --   --   --   BNP 1,723.8*  --   --   --   --    --   --   --      ------------------------------------------------------------------------------------------------------------------ No results for input(s): "CHOL", "HDL", "LDLCALC", "TRIG", "CHOLHDL", "LDLDIRECT" in the last 72 hours.  Lab Results  Component Value Date   HGBA1C 5.1 02/14/2019   ------------------------------------------------------------------------------------------------------------------ No results for input(s): "TSH", "T4TOTAL", "T3FREE", "THYROIDAB" in the last 72 hours.  Invalid input(s): "FREET3"  Cardiac Enzymes No results for input(s): "CKMB", "TROPONINI", "MYOGLOBIN" in the last 168 hours.  Invalid input(s): "CK" ------------------------------------------------------------------------------------------------------------------    Component Value Date/Time   BNP 1,723.8 (H) 09/21/2022 0423    CBG: No results for input(s): "GLUCAP" in the last 168 hours.  Recent Results (from the past 240 hour(s))  Resp panel by RT-PCR (RSV, Flu A&B, Covid) Anterior Nasal Swab     Status: None   Collection Time: 09/21/22  4:23 AM   Specimen: Anterior Nasal Swab  Result Value Ref Range Status   SARS Coronavirus 2 by RT PCR NEGATIVE NEGATIVE Final    Comment: (NOTE) SARS-CoV-2 target nucleic acids are NOT DETECTED.  The SARS-CoV-2 RNA is generally detectable in upper respiratory specimens during the acute phase of infection. The lowest concentration of SARS-CoV-2 viral copies this assay can detect is 138 copies/mL. A negative result does not preclude SARS-Cov-2 infection and should not be used as the sole basis for treatment or other patient management decisions. A negative result may occur with  improper specimen collection/handling, submission of specimen other than nasopharyngeal swab, presence of viral mutation(s) within the areas targeted by this assay, and inadequate number of viral copies(<138 copies/mL). A negative result must be combined with clinical  observations, patient history, and epidemiological information. The expected result is Negative.  Fact Sheet for Patients:  BloggerCourse.com  Fact Sheet for Healthcare Providers:  SeriousBroker.it  This test is no t yet approved or cleared by the [place] FDA and  has been authorized for detection and/or diagnosis of SARS-CoV-2 by FDA under an Emergency Use Authorization (EUA). This EUA will remain  in effect (meaning this test can be used) for the duration of the COVID-19 declaration under Section 564(b)(1) of the Act, 21 U.S.C.section 360bbb-3(b)(1), unless the authorization is terminated  or revoked sooner.       Influenza A by PCR NEGATIVE NEGATIVE Final   Influenza B by PCR NEGATIVE NEGATIVE Final    Comment: (NOTE) The Xpert Xpress SARS-CoV-2/FLU/RSV plus assay is intended as an aid in the diagnosis of influenza from Nasopharyngeal swab specimens and should not be used as a sole basis for treatment. Nasal washings and aspirates are unacceptable for Xpert Xpress SARS-CoV-2/FLU/RSV testing.  Fact Sheet for Patients: BloggerCourse.com  Fact Sheet for Healthcare Providers: SeriousBroker.it  This test is not  yet approved or cleared by the [place] and has been authorized for detection and/or diagnosis of SARS-CoV-2 by FDA under an Emergency Use Authorization (EUA). This EUA will remain in effect (meaning this test can be used) for the duration of the COVID-19 declaration under Section 564(b)(1) of the Act, 21 U.S.C. section 360bbb-3(b)(1), unless the authorization is terminated or revoked.     Resp Syncytial Virus by PCR NEGATIVE NEGATIVE Final    Comment: (NOTE) Fact Sheet for Patients: BloggerCourse.com  Fact Sheet for Healthcare Providers: SeriousBroker.it  This test is not yet approved or cleared by  the [place] FDA and has been authorized for detection and/or diagnosis of SARS-CoV-2 by FDA under an Emergency Use Authorization (EUA). This EUA will remain in effect (meaning this test can be used) for the duration of the COVID-19 declaration under Section 564(b)(1) of the Act, 21 U.S.C. section 360bbb-3(b)(1), unless the authorization is terminated or revoked.  Performed at [facility], [address], [place], [place] [address]   MRSA Next Gen by PCR, Nasal     Status: None   Collection Time: 09/21/22 12:24 PM   Specimen: Nasal Mucosa; Nasal Swab  Result Value Ref Range Status   MRSA by PCR Next Gen NOT DETECTED NOT DETECTED Final    Comment: (NOTE) The GeneXpert MRSA Assay (FDA approved for NASAL specimens only), is one component of a comprehensive MRSA colonization surveillance program. It is not intended to diagnose MRSA infection nor to guide or monitor treatment for MRSA infections. Test performance is not FDA approved in patients less than 64 years old. Performed at [facility], [address] [place], [place] [address]      Radiology Studies: No results found.   Pamella Pert, MD, PhD [facility]  Between 7 am - 7 pm I am available, please contact me via Amion (for emergencies) or Securechat (non urgent messages)  Between 7 pm - 7 am I am not available, please contact night coverage MD/APP via Amion

## 2022-09-26 NOTE — Progress Notes (Signed)
   09/26/22 1934  BiPAP/CPAP/SIPAP  Reason BIPAP/CPAP not in use Non-compliant (patient does not want to wear. Has not been wearing one at home)  BiPAP/CPAP /SiPAP Vitals  Bilateral Breath Sounds  (audible wheezing)

## 2022-09-26 NOTE — Progress Notes (Addendum)
Civil engineer, contracting [facility]) Hospital Liaison Note:   (new referral for outpatient palliative services) Notified by Oswaldo Conroy of patient/family request for [facility] Palliative Care services after discharge at [facility] hospital liaison will follow patient for discharge disposition. Please call with any hospice or outpatient palliative care related questions. Thank you for the opportunity to participate in this patient's care.  Redge Gainer, [facility] Liaison [phone]

## 2022-09-27 ENCOUNTER — Inpatient Hospital Stay (HOSPITAL_COMMUNITY): Payer: Medicare Other

## 2022-09-27 DIAGNOSIS — Z79899 Other long term (current) drug therapy: Secondary | ICD-10-CM | POA: Diagnosis not present

## 2022-09-27 DIAGNOSIS — Z7189 Other specified counseling: Secondary | ICD-10-CM | POA: Diagnosis not present

## 2022-09-27 DIAGNOSIS — I5020 Unspecified systolic (congestive) heart failure: Secondary | ICD-10-CM | POA: Diagnosis not present

## 2022-09-27 DIAGNOSIS — I5033 Acute on chronic diastolic (congestive) heart failure: Secondary | ICD-10-CM | POA: Diagnosis not present

## 2022-09-27 DIAGNOSIS — Z515 Encounter for palliative care: Secondary | ICD-10-CM | POA: Diagnosis not present

## 2022-09-27 LAB — BASIC METABOLIC PANEL
Anion gap: 12 (ref 5–15)
BUN: 38 mg/dL — ABNORMAL HIGH (ref 8–23)
CO2: 25 mmol/L (ref 22–32)
Calcium: 9.3 mg/dL (ref 8.9–10.3)
Chloride: 102 mmol/L (ref 98–111)
Creatinine, Ser: 1.23 mg/dL — ABNORMAL HIGH (ref 0.44–1.00)
GFR, Estimated: 44 mL/min — ABNORMAL LOW (ref >60)
Glucose, Bld: 126 mg/dL — ABNORMAL HIGH (ref 70–99)
Potassium: 3.7 mmol/L (ref 3.5–5.1)
Sodium: 139 mmol/L (ref 135–145)

## 2022-09-27 LAB — MAGNESIUM: Magnesium: 2 mg/dL (ref 1.7–2.4)

## 2022-09-27 MED ORDER — IPRATROPIUM-ALBUTEROL 0.5-2.5 (3) MG/3ML IN SOLN
3.0000 mL | Freq: Three times a day (TID) | RESPIRATORY_TRACT | Status: DC
  Administered 2022-09-27 – 2022-09-29 (×7): 3 mL via RESPIRATORY_TRACT
  Filled 2022-09-27 (×7): qty 3

## 2022-09-27 MED ORDER — FUROSEMIDE 10 MG/ML IJ SOLN
40.0000 mg | Freq: Two times a day (BID) | INTRAMUSCULAR | Status: DC
  Administered 2022-09-27 – 2022-09-29 (×4): 40 mg via INTRAVENOUS
  Filled 2022-09-27 (×4): qty 4

## 2022-09-27 MED ORDER — MELATONIN 5 MG PO TABS
5.0000 mg | ORAL_TABLET | Freq: Once | ORAL | Status: AC
  Administered 2022-09-27: 5 mg via ORAL
  Filled 2022-09-27: qty 1

## 2022-09-27 MED ORDER — TRAZODONE HCL 50 MG PO TABS
50.0000 mg | ORAL_TABLET | Freq: Every day | ORAL | Status: DC
  Administered 2022-09-27: 50 mg via ORAL
  Filled 2022-09-27: qty 1

## 2022-09-27 NOTE — TOC Progression Note (Signed)
Transition of Care [facility]) - Progression Note    Patient Details  Name: Erin Ingram MRN: [id number] Date of Birth: 08-10-1940  Transition of Care [facility]) CM/SW Contact  Carmina Miller, [place] Phone Number: 09/27/2022, 8:41 AM  Clinical Narrative:     Per Vesta Mixer still pending for STR at [facility].   Expected Discharge Plan: Assisted Living Barriers to Discharge: Continued Medical Work up  Expected Discharge Plan and Services In-house Referral: NA Discharge Planning Services: NA   Living arrangements for the past 2 months: Assisted Living Facility                                       Social Determinants of Health (SDOH) Interventions SDOH Screenings   Food Insecurity: No Food Insecurity (09/21/2022)  Housing: Low Risk  (09/21/2022)  Transportation Needs: No Transportation Needs (09/21/2022)  Utilities: Not At Risk (09/21/2022)  Financial Resource Strain: Low Risk  (12/07/2017)  Physical Activity: Inactive (12/07/2017)  Social Connections: Somewhat Isolated (12/07/2017)  Stress: Stress Concern Present (12/07/2017)  Tobacco Use: Medium Risk (09/21/2022)    Readmission Risk Interventions    09/22/2022    1:02 PM  Readmission Risk Prevention Plan  Transportation Screening Complete  HRI or Home Care Consult Complete  Social Work Consult for Recovery Care Planning/Counseling Complete  Palliative Care Screening Not Applicable

## 2022-09-27 NOTE — Progress Notes (Signed)
Daily Progress Note   Patient Name: Erin Ingram       Date: 09/27/2022 DOB: [birth year]  Age: 82 y.o. MRN#: [id number] Attending Physician: Leatha Gilding, MD Primary Care Physician: Mast, Man X, NP Admit Date: 09/21/2022 Length of Stay: 6 days  Reason for Consultation/Follow-up: Establishing goals of care  Subjective:   CC: Patient feels increased decode on dose assisting with dyspnea management.  Following up regarding family meeting patient requested.   Subjective:  Presented to bedside to check on patient this morning.  No family present at bedside at time of visit.  Able to discuss patient's care at this time.  Patient noted that she did not sleep well last night so we discussed increase of trazodone dose which she noted hospitalist had mentioned would plan to do tonight.  Reviewed use of oxycodone to assist with dyspnea management which patient does feel it is helping.  Again reviewed plan that patient is going to attempt rehab with outpatient palliative medicine follow-up through [facility] or care and should patient's medical status further decline, then can transition to hospice.  Spent time providing emotional support via active listening.  Thanked patient for allowing me to visit with her today.  All questions answered at that time and noted palliative medicine team will continue to follow with patient's medical journey.  Objective:   Vital Signs:  BP (!) 102/55 (BP Location: Right Arm)   Pulse 100   Temp 97.7 F (36.5 C) (Oral)   Resp 18   Ht 4\' 10"  (1.473 m)   Wt 83.2 kg   SpO2 95%   BMI 38.34 kg/m   Physical Exam: General: NAD, alert,laying in bed, obese, chronically ill-appearing Eyes: No drainage noted HENT: moist mucous membranes Cardiovascular: RRR Respiratory: no increased work of breathing noted, not in respiratory distress Skin: Ecchymoses present on upper extremities Neuro: A&Ox4, following commands easily Psych: appropriately answers all  questions  Imaging:  I personally reviewed recent imaging.   Assessment & Plan:   Assessment: Patient is an 82 year old female with a past medical history of microcytic anemia, osteoarthritis, stage III CKD, depression, GERD, hiatal hernia, hyperlipidemia, hypothyroidism, obesity, OSA on CPAP, polymyalgia rheumatica, multiple episodes of pneumonia, subclavian artery stenosis, TIA, CAD status post CABG, and CHF who was admitted on 09/21/2022 for management of worsening shortness of breath, lower extremity edema, and hypoxia.  Since being admitted patient has been managed for acute on chronic combined systolic and diastolic heart failure.  Echo during this admission has shown worsening LV dysfunction with a EF of 30-35%.  Cardiology consulted to assist with management.  Palliative medicine team consulted due to complex medical decision making at patient's request.   Recommendations/Plan: # Complex medical decision making/goals of care:    -Extensive conversation with patient and patient's son at bedside on 7/6.  And is for patient to attend rehab with outpatient palliative medicine follow-up via [facility].  Should patient's medical status further deteriorate at any point, can then consider transition to hospice focused care.                -ACP documentation on file reviewed.                 -  Code Status: DNR    # Symptom management                -Dyspnea, in the setting of acute on chronic heart failure                               -  Continue oxycodone to 5 mg every 4 hours as needed.                               -Discontinue tramadol due to potential Aes                   -Insomnia Patient notes sleeping improved with scheduled trazodone.                               -Agree with increase of trazodone to 50 mg nightly.  May need further up titration.    # Psycho-social/Spiritual Support:  - Support System: 2 sons, 4 grandchildren    # Discharge Planning: Skilled Nursing Facility for  rehab with Palliative care service follow-up   Discussed with: patient, RN  Thank you for allowing the palliative care team to participate in the care Silvio Clayman.  Alvester Morin, DO Palliative Care Provider PMT # [phone]  If patient remains symptomatic despite maximum doses, please call PMT at [phone] between 0700 and 1900. Outside of these hours, please call attending, as PMT does not have night coverage.  *Please note that this is a verbal dictation therefore any spelling or grammatical errors are due to the "Dragon Medical One" system interpretation.

## 2022-09-27 NOTE — Progress Notes (Signed)
PROGRESS NOTE  EVA LINQUIST ZOX:[id number] DOB: 11/29/1940 DOA: 09/21/2022 PCP: Mast, Man X, NP   LOS: 6 days   Brief Narrative / Interim history: 82 year old with past medical history significant for microcytic anemia, osteoarthritis, stage III CKD, depression, GERD, hiatal hernia, hyperlipidemia, hypothyroidism, obesity, OSA on CPAP, polymyalgia rheumatica, history of multiple episode of pneumonia, subclavian artery stenosis, TIA, CAD, CABG chronic diastolic heart failure who presents from [facility] assisted [facility] facility with progressive worsening shortness of breath, lower extremity edema, orthopnea cough, and found to be hypoxic oxygen as low as 86%.  Symptoms started a week ago. Patient admitted for acute hypoxic respiratory failure in the setting of acute diastolic heart failure exacerbation.  She was initially started on BiPAP .  Subjective / 24h Interval events: Complains of inability to sleep overnight last night.  Breathing is about the same, intermittently short winded.  Assesement and Plan: Principal Problem:   Acute on chronic diastolic heart failure (HCC) Active Problems:   Vitamin B12 deficiency anemia   Essential hypertension   PAD (peripheral artery disease) (HCC)   Hyperlipidemia LDL goal <70   CAD (coronary artery disease)   GERD (gastroesophageal reflux disease)   Hypothyroidism   Memory loss   Elevated troponin   Acute pulmonary edema (HCC)   HFrEF (heart failure with reduced ejection fraction) (HCC)   Shortness of breath   Goals of care, counseling/discussion   Palliative care encounter   High risk medication use   Need for emotional support   Counseling and coordination of care   Principal problem Acute on chronic combined systolic and diastolic heart failure exacerbation -Patient presented with worsening shortness of breath, lower extremity edema, chest x-ray with finding consistent with fluid overload and pery hilar interstitial and groundglass  edema.  Developing small pleural effusion.  BNP at 1700, elevation of troponin -Cardiology consulted appreciate input -2D echo shows worsening LV function, cardiology recommending medical management.  Started Entresto, was placed on IV diuretics but now on oral Lasix -Clinically improved.  Continue goal-directed medical therapy -Continue oral Lasix.  Repeat chest x-ray today  Active problems  Acute Hypoxic Respiratory Failure, In setting HF and presume COPD exacerbation. PNA - Reports history of smoking, but quit a long time ago.  Has been started on nebulizers, IV steroids, continue, continue steroids, will need to taper   CAD, elevation of troponin -Elevation of troponin in the setting of heart failure exacerbation.  Medical management per cardiology   Leukocytosis -Chest x-ray: With patchy opacities in the bases which may be due to additional edema or superimposed pneumonia.  Completed 5 days of antibiotics   B12 deficiency anemia - Continue with B12 supplement.   Essential hypertension - Continue regimen as below   PAD - Continue with Lipitor and Plavix.   Hyperlipidemia - Continue with Lipitor   GERD - Continue with PPI   Hypothyroidism - Continue with Synthroid   Memory loss, dementia - Continue with Namenda   Polymyalgia Rheumatica - On Chronic prednisone 5 mg daily. Holding while on IV steroid.    Hypokalemia, Hypomagnesemia - replete and monitor   Scheduled Meds:  atorvastatin  10 mg Oral q1800   budesonide (PULMICORT) nebulizer solution  0.25 mg Nebulization BID   Chlorhexidine Gluconate Cloth  6 each Topical Daily   clopidogrel  75 mg Oral Daily   cyanocobalamin  1,000 mcg Oral Daily   dapagliflozin propanediol  10 mg Oral Daily   enoxaparin (LOVENOX) injection  40 mg Subcutaneous Q24H  furosemide  20 mg Oral Daily   ipratropium-albuterol  3 mL Nebulization TID   levothyroxine  75 mcg Oral Q0600   memantine  10 mg Oral BID   metoCLOPramide  5 mg Oral BID    metoprolol succinate  25 mg Oral Daily   mirabegron ER  50 mg Oral Daily   mouth rinse  15 mL Mouth Rinse 4 times per day   pantoprazole  40 mg Oral BID   predniSONE  20 mg Oral Q breakfast   sacubitril-valsartan  1 tablet Oral BID   spironolactone  12.5 mg Oral Daily   traZODone  50 mg Oral QHS   venlafaxine XR  150 mg Oral Daily   Continuous Infusions:   PRN Meds:.acetaminophen **OR** acetaminophen, guaiFENesin-dextromethorphan, ipratropium-albuterol, loperamide, nitroGLYCERIN, mouth rinse, oxyCODONE  Current Outpatient Medications  Medication Instructions   acetaminophen (TYLENOL) 1,000 mg, Oral, 2 times daily PRN   alum & mag hydroxide-simeth (MAALOX PLUS) 400-400-40 MG/5ML suspension 10 mLs, Oral, As needed   atorvastatin (LIPITOR) 10 MG tablet TAKE 1 TABLET ONCE DAILY.   Calcium-Phosphorus-Vitamin D (CALCIUM/VITAMIN D3/ADULT GUMMY) 250-100-500 MG-MG-UNIT CHEW 1 Piece of gum, Oral, 2 times daily   clopidogrel (PLAVIX) 75 MG tablet TAKE 1 TABLET ONCE DAILY.   COENZYME Q-10 PO 10 mg, Oral, Daily at bedtime   cyanocobalamin 1,000 mcg, Oral, Daily   diclofenac Sodium (VOLTAREN) 4 g, Topical, 4 times daily   docusate sodium (COLACE) 100 mg, Oral, Daily PRN   fluticasone (FLONASE) 50 MCG/ACT nasal spray 1 spray, Each Nare, Daily PRN   furosemide (LASIX) 20 mg, Oral, Daily   isosorbide mononitrate (IMDUR) 120 mg, Oral, Daily   ketoconazole (NIZORAL) 2 % shampoo 1 Application, Topical, 2 times weekly, Tuesday and Friday   levocetirizine (XYZAL) 5 MG tablet TAKE 1 TABLET ONCE DAILY IN THE EVENING.   levothyroxine (SYNTHROID) 75 MCG tablet TAKE 1 TABLET ONCE DAILY ON EMPTY STOMACH.   losartan (COZAAR) 12.5 mg, Oral, Daily   melatonin 1 mg, Oral, Daily at bedtime   memantine (NAMENDA) 10 mg, Oral, Nightly   Menthol, Topical Analgesic, (BIOFREEZE) 10 % LIQD 1 Application, Apply externally, As needed   methocarbamol (ROBAXIN) 750 mg, Oral, Every 6 hours PRN   metoCLOPramide (REGLAN) 5  mg, Oral, 2 times daily   metoprolol succinate (TOPROL-XL) 25 mg, Oral, Daily, Take with or immediately following a meal.    miconazole (MICOTIN) 2 % powder Topical, 2 times daily,  Apply to groin and pannus topically   MYRBETRIQ 50 MG TB24 tablet TAKE 1 TABLET BY MOUTH DAILY.   nitroGLYCERIN (NITROSTAT) 0.4 mg, Sublingual, Every 5 min PRN, X 3 doses    pantoprazole (PROTONIX) 40 mg, Oral, 2 times daily   potassium chloride (MICRO-K) 10 MEQ CR capsule 20 mEq, Oral, 2 times daily   predniSONE (DELTASONE) 5 mg, Oral, Daily   PSYLLIUM HUSK PO 0.4 g, Oral, Daily, May keep at bedside    ranolazine (RANEXA) 1000 MG SR tablet TAKE 1 TABLET BY MOUTH TWICE DAILY.   venlafaxine (EFFEXOR) 150 mg, Oral, Daily   Vitamin D 2,000 Units, Oral, Daily   zinc oxide 20 % ointment 1 application , Topical, As needed    Diet Orders (From admission, onward)     Start     Ordered   09/21/22 0619  Diet Heart Room service appropriate? Yes; Fluid consistency: Thin  Diet effective now       Question Answer Comment  Room service appropriate? Yes   Fluid consistency: Thin  09/21/22 0618            DVT prophylaxis: enoxaparin (LOVENOX) injection 40 mg Start: 09/25/22 1000 SCDs Start: 09/21/22 1610   Lab Results  Component Value Date   PLT 319 09/26/2022      Code Status: DNR  Family Communication: No family at bedside  Status is: Inpatient Remains inpatient appropriate because: severity of illness  Level of care: Progressive  Consultants:  Cardiology   Objective: Vitals:   09/26/22 1928 09/26/22 2052 09/27/22 0412 09/27/22 0745  BP:  (!) 119/57 (!) 102/55   Pulse:  88 100   Resp:  19 18   Temp:  98.7 F (37.1 C) 97.7 F (36.5 C)   TempSrc:   Oral   SpO2: 97% 98% 91% 95%  Weight:   83.2 kg   Height:        Intake/Output Summary (Last 24 hours) at 09/27/2022 0948 Last data filed at 09/27/2022 0400 Gross per 24 hour  Intake 720 ml  Output 2050 ml  Net -1330 ml    Wt Readings  from Last 3 Encounters:  09/27/22 83.2 kg  09/09/22 86.3 kg  09/03/22 86.2 kg    Examination:  Constitutional: NAD Eyes: lids and conjunctivae normal, no scleral icterus ENMT: mmm Neck: normal, supple Respiratory: clear to auscultation bilaterally, no wheezing, no crackles. Normal respiratory effort.  Cardiovascular: Regular rate and rhythm, no murmurs / rubs / gallops. No LE edema. Abdomen: soft, no distention, no tenderness. Bowel sounds positive.   Data Reviewed: I have independently reviewed following labs and imaging studies   CBC Recent Labs  Lab 09/21/22 0423 09/22/22 0744 09/23/22 0323 09/24/22 0305 09/25/22 0339 09/26/22 0338  WBC 16.0* 14.1* 16.9* 19.2* 19.9* 15.9*  HGB 9.0* 9.3* 8.6* 9.6* 9.6* 9.5*  HCT 29.1* 30.7* 28.5* 31.4* 31.8* 30.5*  PLT 310 299 292 339 350 319  MCV 105.1* 105.9* 106.3* 105.4* 105.6* 104.5*  MCH 32.5 32.1 32.1 32.2 31.9 32.5  MCHC 30.9 30.3 30.2 30.6 30.2 31.1  RDW 13.8 14.0 13.5 13.4 13.3 13.3  LYMPHSABS 1.7  --   --   --   --   --   MONOABS 1.2*  --   --   --   --   --   EOSABS 0.1  --   --   --   --   --   BASOSABS 0.0  --   --   --   --   --      Recent Labs  Lab 09/21/22 0423 09/21/22 0627 09/22/22 0744 09/22/22 1001 09/23/22 0323 09/24/22 0305 09/25/22 0339 09/26/22 0338 09/27/22 0343  NA 139  --  136  --  138 137 139 138 139  K 3.9  --  3.3*  --  4.7 3.5 3.7 3.9 3.7  CL 107  --  101  --  103 105 104 105 102  CO2 20*  --  23  --  25 22 24 24 25   GLUCOSE 122*  --  100*  --  122* 133* 103* 159* 126*  BUN 30*  --  29*  --  40* 46* 39* 32* 38*  CREATININE 1.35*  --  1.33*  --  1.44* 1.33* 1.13* 1.13* 1.23*  CALCIUM 7.8*  --  7.9*  --  8.1* 8.1* 8.7* 9.0 9.3  AST 15  --   --   --   --  15 15  --   --   ALT 13  --   --   --   --  12 12  --   --   ALKPHOS 37*  --   --   --   --  42 43  --   --   BILITOT 0.5  --   --   --   --  0.5 0.4  --   --   ALBUMIN 3.0*  --   --   --   --  2.7* 2.7*  --   --   MG  --    < >  --    --  2.2 2.3 2.2 1.9 2.0  CRP  --   --   --  26.7*  --   --   --   --   --   PROCALCITON  --   --  0.11  --   --   --   --   --   --   BNP 1,723.8*  --   --   --   --   --   --   --   --    < > = values in this interval not displayed.     ------------------------------------------------------------------------------------------------------------------ No results for input(s): "CHOL", "HDL", "LDLCALC", "TRIG", "CHOLHDL", "LDLDIRECT" in the last 72 hours.  Lab Results  Component Value Date   HGBA1C 5.1 02/14/2019   ------------------------------------------------------------------------------------------------------------------ No results for input(s): "TSH", "T4TOTAL", "T3FREE", "THYROIDAB" in the last 72 hours.  Invalid input(s): "FREET3"  Cardiac Enzymes No results for input(s): "CKMB", "TROPONINI", "MYOGLOBIN" in the last 168 hours.  Invalid input(s): "CK" ------------------------------------------------------------------------------------------------------------------    Component Value Date/Time   BNP 1,723.8 (H) 09/21/2022 0423    CBG: No results for input(s): "GLUCAP" in the last 168 hours.  Recent Results (from the past 240 hour(s))  Resp panel by RT-PCR (RSV, Flu A&B, Covid) Anterior Nasal Swab     Status: None   Collection Time: 09/21/22  4:23 AM   Specimen: Anterior Nasal Swab  Result Value Ref Range Status   SARS Coronavirus 2 by RT PCR NEGATIVE NEGATIVE Final    Comment: (NOTE) SARS-CoV-2 target nucleic acids are NOT DETECTED.  The SARS-CoV-2 RNA is generally detectable in upper respiratory specimens during the acute phase of infection. The lowest concentration of SARS-CoV-2 viral copies this assay can detect is 138 copies/mL. A negative result does not preclude SARS-Cov-2 infection and should not be used as the sole basis for treatment or other patient management decisions. A negative result may occur with  improper specimen collection/handling, submission  of specimen other than nasopharyngeal swab, presence of viral mutation(s) within the areas targeted by this assay, and inadequate number of viral copies(<138 copies/mL). A negative result must be combined with clinical observations, patient history, and epidemiological information. The expected result is Negative.  Fact Sheet for Patients:  BloggerCourse.com  Fact Sheet for Healthcare Providers:  SeriousBroker.it  This test is no t yet approved or cleared by the [place] FDA and  has been authorized for detection and/or diagnosis of SARS-CoV-2 by FDA under an Emergency Use Authorization (EUA). This EUA will remain  in effect (meaning this test can be used) for the duration of the COVID-19 declaration under Section 564(b)(1) of the Act, 21 U.S.C.section 360bbb-3(b)(1), unless the authorization is terminated  or revoked sooner.       Influenza A by PCR NEGATIVE NEGATIVE Final   Influenza B by PCR NEGATIVE NEGATIVE Final    Comment: (NOTE) The Xpert Xpress SARS-CoV-2/FLU/RSV plus assay is intended as an aid in the diagnosis of influenza from Nasopharyngeal  swab specimens and should not be used as a sole basis for treatment. Nasal washings and aspirates are unacceptable for Xpert Xpress SARS-CoV-2/FLU/RSV testing.  Fact Sheet for Patients: BloggerCourse.com  Fact Sheet for Healthcare Providers: SeriousBroker.it  This test is not yet approved or cleared by the [place] FDA and has been authorized for detection and/or diagnosis of SARS-CoV-2 by FDA under an Emergency Use Authorization (EUA). This EUA will remain in effect (meaning this test can be used) for the duration of the COVID-19 declaration under Section 564(b)(1) of the Act, 21 U.S.C. section 360bbb-3(b)(1), unless the authorization is terminated or revoked.     Resp Syncytial Virus by PCR NEGATIVE NEGATIVE  Final    Comment: (NOTE) Fact Sheet for Patients: BloggerCourse.com  Fact Sheet for Healthcare Providers: SeriousBroker.it  This test is not yet approved or cleared by the [place] FDA and has been authorized for detection and/or diagnosis of SARS-CoV-2 by FDA under an Emergency Use Authorization (EUA). This EUA will remain in effect (meaning this test can be used) for the duration of the COVID-19 declaration under Section 564(b)(1) of the Act, 21 U.S.C. section 360bbb-3(b)(1), unless the authorization is terminated or revoked.  Performed at [facility], [address] [place], [place] [address]   MRSA Next Gen by PCR, Nasal     Status: None   Collection Time: 09/21/22 12:24 PM   Specimen: Nasal Mucosa; Nasal Swab  Result Value Ref Range Status   MRSA by PCR Next Gen NOT DETECTED NOT DETECTED Final    Comment: (NOTE) The GeneXpert MRSA Assay (FDA approved for NASAL specimens only), is one component of a comprehensive MRSA colonization surveillance program. It is not intended to diagnose MRSA infection nor to guide or monitor treatment for MRSA infections. Test performance is not FDA approved in patients less than 13 years old. Performed at [facility], [address], [place], [place] [address]      Radiology Studies: No results found.   Pamella Pert, MD, PhD [facility]  Between 7 am - 7 pm I am available, please contact me via Amion (for emergencies) or Securechat (non urgent messages)  Between 7 pm - 7 am I am not available, please contact night coverage MD/APP via Amion

## 2022-09-27 NOTE — Progress Notes (Signed)
   09/26/22 2230  BiPAP/CPAP/SIPAP  $ Non-Invasive Home Ventilator  Initial  BiPAP/CPAP/SIPAP Pt Type Adult  BiPAP/CPAP/SIPAP Resmed  Mask Type Nasal mask  Mask Size Small  Respiratory Rate 18 breaths/min  Flow Rate 2 lpm  Patient Home Equipment No  Auto Titrate Yes (4-20 cmH2O)

## 2022-09-28 DIAGNOSIS — Z515 Encounter for palliative care: Secondary | ICD-10-CM | POA: Diagnosis not present

## 2022-09-28 DIAGNOSIS — I5020 Unspecified systolic (congestive) heart failure: Secondary | ICD-10-CM | POA: Diagnosis not present

## 2022-09-28 DIAGNOSIS — Z79899 Other long term (current) drug therapy: Secondary | ICD-10-CM | POA: Diagnosis not present

## 2022-09-28 DIAGNOSIS — Z7189 Other specified counseling: Secondary | ICD-10-CM | POA: Diagnosis not present

## 2022-09-28 DIAGNOSIS — I5033 Acute on chronic diastolic (congestive) heart failure: Secondary | ICD-10-CM | POA: Diagnosis not present

## 2022-09-28 LAB — BASIC METABOLIC PANEL
Anion gap: 11 (ref 5–15)
BUN: 45 mg/dL — ABNORMAL HIGH (ref 8–23)
CO2: 26 mmol/L (ref 22–32)
Calcium: 9.7 mg/dL (ref 8.9–10.3)
Chloride: 97 mmol/L — ABNORMAL LOW (ref 98–111)
Creatinine, Ser: 1.31 mg/dL — ABNORMAL HIGH (ref 0.44–1.00)
GFR, Estimated: 40 mL/min — ABNORMAL LOW (ref >60)
Glucose, Bld: 130 mg/dL — ABNORMAL HIGH (ref 70–99)
Potassium: 3.9 mmol/L (ref 3.5–5.1)
Sodium: 134 mmol/L — ABNORMAL LOW (ref 135–145)

## 2022-09-28 LAB — MAGNESIUM: Magnesium: 1.6 mg/dL — ABNORMAL LOW (ref 1.7–2.4)

## 2022-09-28 MED ORDER — MELATONIN 5 MG PO TABS: 5.0000 mg | ORAL_TABLET | Freq: Every day | ORAL | Status: DC

## 2022-09-28 MED ORDER — MELATONIN 5 MG PO TABS
5.0000 mg | ORAL_TABLET | Freq: Every day | ORAL | Status: DC
  Administered 2022-09-28 – 2022-10-01 (×3): 5 mg via ORAL
  Filled 2022-09-28 (×4): qty 1

## 2022-09-28 MED ORDER — MAGNESIUM SULFATE 2 GM/50ML IV SOLN
2.0000 g | Freq: Once | INTRAVENOUS | Status: AC
  Administered 2022-09-28: 2 g via INTRAVENOUS
  Filled 2022-09-28: qty 50

## 2022-09-28 NOTE — Progress Notes (Signed)
Daily Progress Note   Patient Name: Erin Ingram       Date: 09/28/2022 DOB: 1940/10/02  Age: 82 y.o. MRN#: [id number] Attending Physician: Leatha Gilding, MD Primary Care Physician: Mast, Man X, NP Admit Date: 09/21/2022 Length of Stay: 7 days  Reason for Consultation/Follow-up: Establishing goals of care  Subjective:   CC: Patient sitting up in chair when seen today; requesting oxycodone for dyspnea.  Following up regarding family meeting patient requested.   Subjective:  EMR reviewed prior to presenting to bedside.  When presenting to bedside, patient seen sitting up in chair beside bed.  Bedside RN was present as well for conversation.  Able to review patient's symptom burden at this time.  Patient requesting as needed dose of oxycodone for dyspnea which RN was able to provide at that time.  Patient noting still did not sleep well so asked to have melatonin scheduled at 9 PM tonight.  Noted would adjust this.  Patient notes she already talked to the hospitalist about discontinue trazodone.  Acknowledges.  Spent time providing emotional support via active listening.  Discussed importance of continuing conversations with [facility] or care palliative medicine team in the outpatient setting.  All questions answered at that time.  Informed patient that my partner Dr. Linna Darner would be coming on service tomorrow if further palliative medicine needs.  Thanked patient for allowing me to visit with her today.  Objective:   Vital Signs:  BP 101/86 (BP Location: Left Arm)   Pulse (!) 101   Temp 98.5 F (36.9 C) (Oral)   Resp 18   Ht 4\' 10"  (1.473 m)   Wt 81.8 kg   SpO2 90%   BMI 37.69 kg/m   Physical Exam: General: NAD, alert, sitting in bedside chair, obese, chronically ill-appearing Eyes: No drainage noted HENT: moist mucous membranes Cardiovascular: RRR Respiratory: slightly increased work of breathing noted, not in respiratory distress Skin: Ecchymoses present on upper  extremities Neuro: A&Ox4, following commands easily Psych: appropriately answers all questions  Imaging:  I personally reviewed recent imaging.   Assessment & Plan:   Assessment: Patient is an 82 year old female with a past medical history of microcytic anemia, osteoarthritis, stage III CKD, depression, GERD, hiatal hernia, hyperlipidemia, hypothyroidism, obesity, OSA on CPAP, polymyalgia rheumatica, multiple episodes of pneumonia, subclavian artery stenosis, TIA, CAD status post CABG, and CHF who was admitted on 09/21/2022 for management of worsening shortness of breath, lower extremity edema, and hypoxia.  Since being admitted patient has been managed for acute on chronic combined systolic and diastolic heart failure.  Echo during this admission has shown worsening LV dysfunction with a EF of 30-35%.  Cardiology consulted to assist with management.  Palliative medicine team consulted due to complex medical decision making at patient's request.   Recommendations/Plan: # Complex medical decision making/goals of care:    -Extensive conversation with patient and patient's son at bedside on 7/6.  Plan is for patient to attend rehab with outpatient palliative medicine follow-up via [facility].  Should patient's medical status further deteriorate at any point, can then consider transition to hospice focused care.                -ACP documentation on file reviewed.                 -  Code Status: DNR    # Symptom management                -Dyspnea, in the setting of acute  on chronic heart failure                               -Continue oxycodone to 5 mg every 4 hours as needed.                               -Discontinue tramadol due to potential Aes                   -Insomnia Patient notes sleeping improved with scheduled trazodone.                               -Discontinue trazodone -Scheduling melatonin at 9PM as per patient's request   # Psycho-social/Spiritual Support:  - Support System:  2 sons, 4 grandchildren    # Discharge Planning: Skilled Nursing Facility for rehab with Palliative care service follow-up   Discussed with: patient, RN  Thank you for allowing the palliative care team to participate in the care Silvio Clayman.  Alvester Morin, DO Palliative Care Provider PMT # [phone]  If patient remains symptomatic despite maximum doses, please call PMT at [phone] between 0700 and 1900. Outside of these hours, please call attending, as PMT does not have night coverage.  *Please note that this is a verbal dictation therefore any spelling or grammatical errors are due to the "[employer]agon [employer] One" system interpretation.

## 2022-09-28 NOTE — Progress Notes (Signed)
PROGRESS NOTE  Erin Ingram JXB:[id number] DOB: September 24, 1940 DOA: 09/21/2022 PCP: Mast, Man X, NP   LOS: 7 days   Brief Narrative / Interim history: 82 year old with past medical history significant for microcytic anemia, osteoarthritis, stage III CKD, depression, GERD, hiatal hernia, hyperlipidemia, hypothyroidism, obesity, OSA on CPAP, polymyalgia rheumatica, history of multiple episode of pneumonia, subclavian artery stenosis, TIA, CAD, CABG chronic diastolic heart failure who presents from [facility] assisted living facility with progressive worsening shortness of breath, lower extremity edema, orthopnea cough, and found to be hypoxic oxygen as low as 86%.  Symptoms started a week ago. Patient admitted for acute hypoxic respiratory failure in the setting of acute diastolic heart failure exacerbation.  She was initially started on BiPAP .  Subjective / 24h Interval events: Still unable to sleep.  Breathing a little bit better  Assesement and Plan: Principal Problem:   Acute on chronic diastolic heart failure (HCC) Active Problems:   Vitamin B12 deficiency anemia   Essential hypertension   PAD (peripheral artery disease) (HCC)   Hyperlipidemia LDL goal <70   CAD (coronary artery disease)   GERD (gastroesophageal reflux disease)   Hypothyroidism   Memory loss   Elevated troponin   Acute pulmonary edema (HCC)   HFrEF (heart failure with reduced ejection fraction) (HCC)   Shortness of breath   Goals of care, counseling/discussion   Palliative care encounter   High risk medication use   Need for emotional support   Counseling and coordination of care   Principal problem Acute on chronic combined systolic and diastolic heart failure exacerbation -Patient presented with worsening shortness of breath, lower extremity edema, chest x-ray with finding consistent with fluid overload and pery hilar interstitial and groundglass edema.  Developing small pleural effusion.  BNP at 1700,  elevation of troponin -Cardiology consulted appreciate input -2D echo shows worsening LV function, cardiology recommending medical management.  Started Entresto, placed back on IV diuretics since chest x-ray yesterday showed persistent fluid overload -Clinically improved.  Continue goal-directed medical therapy -Continue IV diuresis for 1 more day.  Suspect could go to SNF tomorrow  Active problems  Acute Hypoxic Respiratory Failure, In setting HF and presume COPD exacerbation. PNA - Reports history of smoking, but quit a long time ago.  Has been started on nebulizers, IV steroids, continue, continue steroids, will need to taper   CAD, elevation of troponin -Elevation of troponin in the setting of heart failure exacerbation.  Medical management per cardiology   Leukocytosis -Chest x-ray: With patchy opacities in the bases which may be due to additional edema or superimposed pneumonia.  Status post 5 days of antibiotics   B12 deficiency anemia - Continue with B12 supplement.   Essential hypertension - Continue regimen as below   PAD - Continue with Lipitor and Plavix.   Hyperlipidemia - Continue with Lipitor   GERD - Continue with PPI   Hypothyroidism - Continue with Synthroid   Memory loss, dementia - Continue with Namenda   Polymyalgia Rheumatica - On Chronic prednisone 5 mg daily. Holding while on IV steroid.    Hypokalemia, Hypomagnesemia - replete and monitor   Scheduled Meds:  atorvastatin  10 mg Oral q1800   budesonide (PULMICORT) nebulizer solution  0.25 mg Nebulization BID   Chlorhexidine Gluconate Cloth  6 each Topical Daily   clopidogrel  75 mg Oral Daily   cyanocobalamin  1,000 mcg Oral Daily   dapagliflozin propanediol  10 mg Oral Daily   enoxaparin (LOVENOX) injection  40 mg  Subcutaneous Q24H   furosemide  40 mg Intravenous Q12H   ipratropium-albuterol  3 mL Nebulization TID   levothyroxine  75 mcg Oral Q0600   melatonin  5 mg Oral QHS   memantine  10 mg Oral  BID   metoCLOPramide  5 mg Oral BID   metoprolol succinate  25 mg Oral Daily   mirabegron ER  50 mg Oral Daily   mouth rinse  15 mL Mouth Rinse 4 times per day   pantoprazole  40 mg Oral BID   predniSONE  20 mg Oral Q breakfast   sacubitril-valsartan  1 tablet Oral BID   spironolactone  12.5 mg Oral Daily   venlafaxine XR  150 mg Oral Daily   Continuous Infusions:   PRN Meds:.acetaminophen **OR** acetaminophen, guaiFENesin-dextromethorphan, ipratropium-albuterol, loperamide, nitroGLYCERIN, mouth rinse, oxyCODONE  Current Outpatient Medications  Medication Instructions   acetaminophen (TYLENOL) 1,000 mg, Oral, 2 times daily PRN   alum & mag hydroxide-simeth (MAALOX PLUS) 400-400-40 MG/5ML suspension 10 mLs, Oral, As needed   atorvastatin (LIPITOR) 10 MG tablet TAKE 1 TABLET ONCE DAILY.   Calcium-Phosphorus-Vitamin D (CALCIUM/VITAMIN D3/ADULT GUMMY) 250-100-500 MG-MG-UNIT CHEW 1 Piece of gum, Oral, 2 times daily   clopidogrel (PLAVIX) 75 MG tablet TAKE 1 TABLET ONCE DAILY.   COENZYME Q-10 PO 10 mg, Oral, Daily at bedtime   cyanocobalamin 1,000 mcg, Oral, Daily   diclofenac Sodium (VOLTAREN) 4 g, Topical, 4 times daily   docusate sodium (COLACE) 100 mg, Oral, Daily PRN   fluticasone (FLONASE) 50 MCG/ACT nasal spray 1 spray, Each Nare, Daily PRN   furosemide (LASIX) 20 mg, Oral, Daily   isosorbide mononitrate (IMDUR) 120 mg, Oral, Daily   ketoconazole (NIZORAL) 2 % shampoo 1 Application, Topical, 2 times weekly, Tuesday and Friday   levocetirizine (XYZAL) 5 MG tablet TAKE 1 TABLET ONCE DAILY IN THE EVENING.   levothyroxine (SYNTHROID) 75 MCG tablet TAKE 1 TABLET ONCE DAILY ON EMPTY STOMACH.   losartan (COZAAR) 12.5 mg, Oral, Daily   melatonin 1 mg, Oral, Daily at bedtime   memantine (NAMENDA) 10 mg, Oral, Nightly   Menthol, Topical Analgesic, (BIOFREEZE) 10 % LIQD 1 Application, Apply externally, As needed   methocarbamol (ROBAXIN) 750 mg, Oral, Every 6 hours PRN   metoCLOPramide  (REGLAN) 5 mg, Oral, 2 times daily   metoprolol succinate (TOPROL-XL) 25 mg, Oral, Daily, Take with or immediately following a meal.    miconazole (MICOTIN) 2 % powder Topical, 2 times daily,  Apply to groin and pannus topically   MYRBETRIQ 50 MG TB24 tablet TAKE 1 TABLET BY MOUTH DAILY.   nitroGLYCERIN (NITROSTAT) 0.4 mg, Sublingual, Every 5 min PRN, X 3 doses    pantoprazole (PROTONIX) 40 mg, Oral, 2 times daily   potassium chloride (MICRO-K) 10 MEQ CR capsule 20 mEq, Oral, 2 times daily   predniSONE (DELTASONE) 5 mg, Oral, Daily   PSYLLIUM HUSK PO 0.4 g, Oral, Daily, May keep at bedside    ranolazine (RANEXA) 1000 MG SR tablet TAKE 1 TABLET BY MOUTH TWICE DAILY.   venlafaxine (EFFEXOR) 150 mg, Oral, Daily   Vitamin D 2,000 Units, Oral, Daily   zinc oxide 20 % ointment 1 application , Topical, As needed    Diet Orders (From admission, onward)     Start     Ordered   09/21/22 0619  Diet Heart Room service appropriate? Yes; Fluid consistency: Thin  Diet effective now       Question Answer Comment  Room service appropriate? Yes  Fluid consistency: Thin      09/21/22 0618            DVT prophylaxis: enoxaparin (LOVENOX) injection 40 mg Start: 09/25/22 1000 SCDs Start: 09/21/22 1610   Lab Results  Component Value Date   PLT 319 09/26/2022      Code Status: DNR  Family Communication: No family at bedside  Status is: Inpatient Remains inpatient appropriate because: severity of illness  Level of care: Progressive  Consultants:  Cardiology   Objective: Vitals:   09/27/22 2352 09/28/22 0500 09/28/22 0601 09/28/22 0739  BP: 104/71  101/86   Pulse: 98  (!) 101   Resp:   18   Temp:   98.5 F (36.9 C)   TempSrc:   Oral   SpO2:   92% 90%  Weight:  81.8 kg    Height:        Intake/Output Summary (Last 24 hours) at 09/28/2022 1017 Last data filed at 09/28/2022 0600 Gross per 24 hour  Intake 480 ml  Output 2150 ml  Net -1670 ml    Wt Readings from Last 3  Encounters:  09/28/22 81.8 kg  09/09/22 86.3 kg  09/03/22 86.2 kg    Examination:  Constitutional: NAD Eyes: lids and conjunctivae normal, no scleral icterus ENMT: mmm Neck: normal, supple Respiratory: clear to auscultation bilaterally, no wheezing, no crackles. Normal respiratory effort.  Cardiovascular: Regular rate and rhythm, no murmurs / rubs / gallops. No LE edema. Abdomen: soft, no distention, no tenderness. Bowel sounds positive.   Data Reviewed: I have independently reviewed following labs and imaging studies   CBC Recent Labs  Lab 09/22/22 0744 09/23/22 0323 09/24/22 0305 09/25/22 0339 09/26/22 0338  WBC 14.1* 16.9* 19.2* 19.9* 15.9*  HGB 9.3* 8.6* 9.6* 9.6* 9.5*  HCT 30.7* 28.5* 31.4* 31.8* 30.5*  PLT 299 292 339 350 319  MCV 105.9* 106.3* 105.4* 105.6* 104.5*  MCH 32.1 32.1 32.2 31.9 32.5  MCHC 30.3 30.2 30.6 30.2 31.1  RDW 14.0 13.5 13.4 13.3 13.3     Recent Labs  Lab 09/22/22 0744 09/22/22 1001 09/23/22 0323 09/24/22 0305 09/25/22 0339 09/26/22 0338 09/27/22 0343 09/28/22 0659  NA 136  --    < > 137 139 138 139 134*  K 3.3*  --    < > 3.5 3.7 3.9 3.7 3.9  CL 101  --    < > 105 104 105 102 97*  CO2 23  --    < > 22 24 24 25 26   GLUCOSE 100*  --    < > 133* 103* 159* 126* 130*  BUN 29*  --    < > 46* 39* 32* 38* 45*  CREATININE 1.33*  --    < > 1.33* 1.13* 1.13* 1.23* 1.31*  CALCIUM 7.9*  --    < > 8.1* 8.7* 9.0 9.3 9.7  AST  --   --   --  15 15  --   --   --   ALT  --   --   --  12 12  --   --   --   ALKPHOS  --   --   --  42 43  --   --   --   BILITOT  --   --   --  0.5 0.4  --   --   --   ALBUMIN  --   --   --  2.7* 2.7*  --   --   --  MG  --   --    < > 2.3 2.2 1.9 2.0 1.6*  CRP  --  26.7*  --   --   --   --   --   --   PROCALCITON 0.11  --   --   --   --   --   --   --    < > = values in this interval not displayed.      ------------------------------------------------------------------------------------------------------------------ No results for input(s): "CHOL", "HDL", "LDLCALC", "TRIG", "CHOLHDL", "LDLDIRECT" in the last 72 hours.  Lab Results  Component Value Date   HGBA1C 5.1 02/14/2019   ------------------------------------------------------------------------------------------------------------------ No results for input(s): "TSH", "T4TOTAL", "T3FREE", "THYROIDAB" in the last 72 hours.  Invalid input(s): "FREET3"  Cardiac Enzymes No results for input(s): "CKMB", "TROPONINI", "MYOGLOBIN" in the last 168 hours.  Invalid input(s): "CK" ------------------------------------------------------------------------------------------------------------------    Component Value Date/Time   BNP 1,723.8 (H) 09/21/2022 0423    CBG: No results for input(s): "GLUCAP" in the last 168 hours.  Recent Results (from the past 240 hour(s))  Resp panel by RT-PCR (RSV, Flu A&B, Covid) Anterior Nasal Swab     Status: None   Collection Time: 09/21/22  4:23 AM   Specimen: Anterior Nasal Swab  Result Value Ref Range Status   SARS Coronavirus 2 by RT PCR NEGATIVE NEGATIVE Final    Comment: (NOTE) SARS-CoV-2 target nucleic acids are NOT DETECTED.  The SARS-CoV-2 RNA is generally detectable in upper respiratory specimens during the acute phase of infection. The lowest concentration of SARS-CoV-2 viral copies this assay can detect is 138 copies/mL. A negative result does not preclude SARS-Cov-2 infection and should not be used as the sole basis for treatment or other patient management decisions. A negative result may occur with  improper specimen collection/handling, submission of specimen other than nasopharyngeal swab, presence of viral mutation(s) within the areas targeted by this assay, and inadequate number of viral copies(<138 copies/mL). A negative result must be combined with clinical observations,  patient history, and epidemiological information. The expected result is Negative.  Fact Sheet for Patients:  BloggerCourse.com  Fact Sheet for Healthcare Providers:  SeriousBroker.it  This test is no t yet approved or cleared by the [place] FDA and  has been authorized for detection and/or diagnosis of SARS-CoV-2 by FDA under an Emergency Use Authorization (EUA). This EUA will remain  in effect (meaning this test can be used) for the duration of the COVID-19 declaration under Section 564(b)(1) of the Act, 21 U.S.C.section 360bbb-3(b)(1), unless the authorization is terminated  or revoked sooner.       Influenza A by PCR NEGATIVE NEGATIVE Final   Influenza B by PCR NEGATIVE NEGATIVE Final    Comment: (NOTE) The Xpert Xpress SARS-CoV-2/FLU/RSV plus assay is intended as an aid in the diagnosis of influenza from Nasopharyngeal swab specimens and should not be used as a sole basis for treatment. Nasal washings and aspirates are unacceptable for Xpert Xpress SARS-CoV-2/FLU/RSV testing.  Fact Sheet for Patients: BloggerCourse.com  Fact Sheet for Healthcare Providers: SeriousBroker.it  This test is not yet approved or cleared by the [place] FDA and has been authorized for detection and/or diagnosis of SARS-CoV-2 by FDA under an Emergency Use Authorization (EUA). This EUA will remain in effect (meaning this test can be used) for the duration of the COVID-19 declaration under Section 564(b)(1) of the Act, 21 U.S.C. section 360bbb-3(b)(1), unless the authorization is terminated or revoked.     Resp Syncytial Virus by PCR  NEGATIVE NEGATIVE Final    Comment: (NOTE) Fact Sheet for Patients: BloggerCourse.com  Fact Sheet for Healthcare Providers: SeriousBroker.it  This test is not yet approved or cleared by the [place] FDA and has been authorized for detection and/or diagnosis of SARS-CoV-2 by FDA under an Emergency Use Authorization (EUA). This EUA will remain in effect (meaning this test can be used) for the duration of the COVID-19 declaration under Section 564(b)(1) of the Act, 21 U.S.C. section 360bbb-3(b)(1), unless the authorization is terminated or revoked.  Performed at [facility], [address] [place], [place] [address]   MRSA Next Gen by PCR, Nasal     Status: None   Collection Time: 09/21/22 12:24 PM   Specimen: Nasal Mucosa; Nasal Swab  Result Value Ref Range Status   MRSA by PCR Next Gen NOT DETECTED NOT DETECTED Final    Comment: (NOTE) The GeneXpert MRSA Assay (FDA approved for NASAL specimens only), is one component of a comprehensive MRSA colonization surveillance program. It is not intended to diagnose MRSA infection nor to guide or monitor treatment for MRSA infections. Test performance is not FDA approved in patients less than 50 years old. Performed at [facility], [address], [place], [place] [address]      Radiology Studies: DG CHEST PORT 1 VIEW  Result Date: 09/27/2022 CLINICAL DATA:  Dyspnea. EXAM: PORTABLE CHEST 1 VIEW COMPARISON:  09/21/2022. FINDINGS: Stable changes from prior CABG surgery. No mediastinal or hilar masses. Bilateral interstitial and hazy airspace lung opacities are similar to the prior exam. Suspect small effusions. No pneumothorax. Skeletal structures are grossly intact. IMPRESSION: 1. Findings consistent with CHF with pulmonary edema, similar to the exam from 09/21/2022. Electronically Signed   By: Amie Portland M.D.   On: 09/27/2022 11:43     Pamella Pert, MD, PhD [facility]  Between 7 am - 7 pm I am available, please contact me via Amion (for emergencies) or Securechat (non urgent messages)  Between 7 pm - 7 am I am not available, please contact night coverage MD/APP via Amion

## 2022-09-28 NOTE — TOC Progression Note (Addendum)
Transition of Care [facility]) - Progression Note    Patient Details  Name: Erin Ingram MRN: [id number] Date of Birth: Dec 01, 1940  Transition [facility] CM/SW Contact  Geni Bers, RN Phone Number: 09/28/2022, 1:53 PM  Clinical Narrative:    Talbot Grumbling approved pt for SNF 7/8 to 7/10 Ref number [id number]. Spoke with Becky Augusta at [facility], pt may return on tomorrow. Left VM for son Molli Hazard.    Expected Discharge Plan: Assisted Living Barriers to Discharge: Continued Medical Work up  Expected Discharge Plan and Services In-house Referral: NA Discharge Planning Services: NA   Living arrangements for the past 2 months: Assisted Living Facility                                       Social Determinants of Health (SDOH) Interventions SDOH Screenings   Food Insecurity: No Food Insecurity (09/21/2022)  Housing: Low Risk  (09/21/2022)  Transportation Needs: No Transportation Needs (09/21/2022)  Utilities: Not At Risk (09/21/2022)  Financial Resource Strain: Low Risk  (12/07/2017)  Physical Activity: Inactive (12/07/2017)  Social Connections: Somewhat Isolated (12/07/2017)  Stress: Stress Concern Present (12/07/2017)  Tobacco Use: Medium Risk (09/21/2022)    Readmission Risk Interventions    09/22/2022    1:02 PM  Readmission Risk Prevention Plan  Transportation Screening Complete  HRI or Home Care Consult Complete  Social Work Consult for Recovery Care Planning/Counseling Complete  Palliative Care Screening Not Applicable

## 2022-09-28 NOTE — Progress Notes (Signed)
Physical Therapy Treatment Patient Details Name: Erin Ingram MRN: [id number] DOB: 1941/01/08 Today's Date: 09/28/2022   History of Present Illness Patient is a 82 year old female who presented on 7/1 with shortness of breath, cough, and BLE edema. Patient was also found to be hypoxic in ED. 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    PT Comments   Pt admitted with above diagnosis.  Pt currently with functional limitations due to the deficits listed below (see PT Problem List). Pt arrived and pt awake and eager to participate reporting that MD stated she should get in the recliner today. Pt indicated no pain at rest, and exhibited pain behaviors with attempts for RLE WB at RW. Pt required mod A for supine <> sit. Min A for sit to stand  from EOB with cues for proper UE placement, pt able to demonstrate progression to min guard with STS. Pt unable to complete pivoting task to recliner. PT assisted pt with use of STEADY lift and tolerated well. Pt left seated in recliner, all needs in place and nursing staff present. Pt indicated she will return to [facility] and work with therapy in rehab. Pt will benefit from acute skilled PT to increase their independence and safety with mobility to allow discharge.       Assistance Recommended at Discharge Frequent or constant Supervision/Assistance  If plan is discharge home, recommend the following:  Can travel by private vehicle    Two people to help with walking and/or transfers;A little help with bathing/dressing/bathroom;A lot of help with bathing/dressing/bathroom;Help with stairs or ramp for entrance;Assist for transportation   No  Equipment Recommendations  None recommended by PT    Recommendations for Other Services       Precautions / Restrictions  Precautions Precautions: Fall Restrictions Weight Bearing Restrictions: No     Mobility  Bed Mobility Overal bed mobility: Needs Assistance Bed Mobility: Supine to Sit, Sit to Supine     Supine to sit: Mod assist, HOB elevated Sit to supine: Mod assist   General bed mobility comments: min cues for use of bed rails, increased time    Transfers Overall transfer level: Needs assistance Equipment used: Rolling walker (2 wheels) Transfers: Sit to/from Stand Sit to Stand: Min assist (min A for initial sit to stand from EOB and pt able to progress to min guard)           General transfer comment: pt performed multiple sit to stands (6) from EOB and exhibited progressive IND with task, pt reported she did not think she would be able to perform pivot due to pain or anticpated pain with R LE WB, pt reported she might not be able to make it to the recliner and that she " blew her wad." PT abel to utilize STEADY for transfer bed to recliner, nurse present and aware of need for lift to return to bed.    Ambulation/Gait               General Gait Details: NT   Stairs             Wheelchair Mobility     Tilt Bed    Modified Rankin (Stroke Patients Only)       Balance Overall balance assessment: Needs assistance Sitting-balance support: Feet supported, Bilateral upper extremity supported Sitting balance-Leahy Scale: Good     Standing balance support:  Reliant on assistive device for balance, Bilateral upper extremity supported, During functional activity Standing balance-Leahy Scale: Poor                              Cognition Arousal/Alertness: Awake/alert Behavior During Therapy: WFL for tasks assessed/performed Overall Cognitive Status: Within Functional Limits for tasks assessed                                          Exercises      General Comments        Pertinent Vitals/Pain Pain Assessment Pain Assessment:  Faces Faces Pain Scale: Hurts a little bit Pain Location: R LE Pain Descriptors / Indicators: Grimacing, Aching Pain Intervention(s): Limited activity within patient's tolerance, Monitored during session, Repositioned    Home Living                          Prior Function            PT Goals (current goals can now be found in the care plan section) Acute Rehab PT Goals Patient Stated Goal: to go home PT Goal Formulation: With patient Time For Goal Achievement: 10/07/22 Potential to Achieve Goals: Fair    Frequency    Min 1X/week      PT Plan Current plan remains appropriate    Co-evaluation              AM-PAC PT "6 Clicks" Mobility   Outcome Measure  Help needed turning from your back to your side while in a flat bed without using bedrails?: A Lot Help needed moving from lying on your back to sitting on the side of a flat bed without using bedrails?: A Lot Help needed moving to and from a bed to a chair (including a wheelchair)?: A Lot Help needed standing up from a chair using your arms (e.g., wheelchair or bedside chair)?: A Lot Help needed to walk in hospital room?: Total Help needed climbing 3-5 steps with a railing? : Total 6 Click Score: 10    End of Session Equipment Utilized During Treatment: Gait belt;Oxygen (3 L/min and pt saturating at 97%) Activity Tolerance: Patient tolerated treatment well Patient left: in chair;with call bell/phone within reach;with nursing/sitter in room Nurse Communication: Mobility status;Need for lift equipment PT Visit Diagnosis: Unsteadiness on feet (R26.81);Difficulty in walking, not elsewhere classified (R26.2)     Time: 1610-9604 PT Time Calculation (min) (ACUTE ONLY): 27 min  Charges:    $Therapeutic Activity: 23-37 mins PT General Charges $$ ACUTE PT VISIT: 1 Visit                     Erin Ingram, PT Acute Rehab    Jacqualyn Posey 09/28/2022, 11:42 AM

## 2022-09-29 DIAGNOSIS — I5033 Acute on chronic diastolic (congestive) heart failure: Secondary | ICD-10-CM | POA: Diagnosis not present

## 2022-09-29 LAB — BASIC METABOLIC PANEL
Anion gap: 12 (ref 5–15)
BUN: 60 mg/dL — ABNORMAL HIGH (ref 8–23)
CO2: 27 mmol/L (ref 22–32)
Calcium: 9.6 mg/dL (ref 8.9–10.3)
Chloride: 96 mmol/L — ABNORMAL LOW (ref 98–111)
Creatinine, Ser: 1.56 mg/dL — ABNORMAL HIGH (ref 0.44–1.00)
GFR, Estimated: 33 mL/min — ABNORMAL LOW (ref >60)
Glucose, Bld: 107 mg/dL — ABNORMAL HIGH (ref 70–99)
Potassium: 3.7 mmol/L (ref 3.5–5.1)
Sodium: 135 mmol/L (ref 135–145)

## 2022-09-29 LAB — MAGNESIUM: Magnesium: 2.2 mg/dL (ref 1.7–2.4)

## 2022-09-29 LAB — GLUCOSE, CAPILLARY: Glucose-Capillary: 148 mg/dL — ABNORMAL HIGH (ref 70–99)

## 2022-09-29 MED ORDER — PREDNISONE 10 MG PO TABS
10.0000 mg | ORAL_TABLET | Freq: Every day | ORAL | Status: DC
  Administered 2022-09-30 – 2022-10-02 (×3): 10 mg via ORAL
  Filled 2022-09-29 (×3): qty 1

## 2022-09-29 MED ORDER — IPRATROPIUM-ALBUTEROL 0.5-2.5 (3) MG/3ML IN SOLN
3.0000 mL | Freq: Two times a day (BID) | RESPIRATORY_TRACT | Status: DC
  Administered 2022-09-29 – 2022-10-02 (×6): 3 mL via RESPIRATORY_TRACT
  Filled 2022-09-29 (×6): qty 3

## 2022-09-29 MED ORDER — ENOXAPARIN SODIUM 30 MG/0.3ML IJ SOSY
30.0000 mg | PREFILLED_SYRINGE | INTRAMUSCULAR | Status: DC
  Administered 2022-09-30 – 2022-10-02 (×3): 30 mg via SUBCUTANEOUS
  Filled 2022-09-29 (×3): qty 0.3

## 2022-09-29 NOTE — Progress Notes (Signed)
Occupational Therapy Treatment Patient Details Name: MUNIRAH MUNNELL MRN: [id number] DOB: November 30, 1940 Today's Date: 09/29/2022   History of present illness Patient is a 82 year old female who presented on 7/1 with shortness of breath, cough, and BLE edema. Patient was also found to be hypoxic in ED. 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   OT comments  Progress was limited with patient very lethargic today. Patient was noted to be very lethargic with increased cues to participate in tasks today compared to previous session. Nurse reporting that patient was started on medications to assist with patient sleeping at night. Patient reported " I have not slept in 16 years" when asked why she was so sleepy today. +2 with STEDY to transfer from bed to recliner in room with set up for grooming tasks seated in recliner. Patient's discharge plan remains appropriate at this time. OT will continue to follow acutely.     Recommendations for follow up therapy are one component of a multi-disciplinary discharge planning process, led by the attending physician.  Recommendations may be updated based on patient status, additional functional criteria and insurance authorization.    Assistance Recommended at Discharge Frequent or constant Supervision/Assistance  Patient can return home with the following  A lot of help with bathing/dressing/bathroom;Assistance with cooking/housework;Two people to help with walking and/or transfers;Direct supervision/assist for medications management;Assist for transportation;Help with stairs or ramp for entrance;Direct supervision/assist for financial management   Equipment Recommendations  None recommended by OT       Precautions / Restrictions Precautions Precautions: Fall Restrictions Weight Bearing  Restrictions: No       Mobility Bed Mobility Overal bed mobility: Needs Assistance Bed Mobility: Supine to Sit     Supine to sit: Max assist                       ADL either performed or assessed with clinical judgement   ADL Overall ADL's : Needs assistance/impaired     Grooming: Supervision/safety;Sitting                   Toilet Transfer: +2 for physical assistance;+2 for safety/equipment;Moderate assistance Toilet Transfer Details (indicate cue type and reason): with STEDY to transfer from bed to recliner and back.                  Cognition Arousal/Alertness: Lethargic, Suspect due to medications Behavior During Therapy: Flat affect Overall Cognitive Status: Difficult to assess     General Comments: patient very lethargic during session compared to eval. able to anser questions and follow one step cues with increased time                   Pertinent Vitals/ Pain       Pain Assessment Pain Assessment: Faces Faces Pain Scale: Hurts a little bit Pain Location: R LE Pain Descriptors / Indicators: Grimacing, Aching Pain Intervention(s): Limited activity within patient's tolerance, Monitored during session, Repositioned         Frequency  Min 1X/week        Progress Toward Goals  OT Goals(current goals can now be found in the care plan section)  Progress towards OT goals: Progressing toward goals     Plan Discharge plan remains appropriate       AM-PAC OT "6 Clicks" Daily Activity     Outcome Measure  Help from another person eating meals?: A Little Help from another person taking care of personal grooming?: A Little Help from another person toileting, which includes using toliet, bedpan, or urinal?: A Lot Help from another person bathing (including washing, rinsing, drying)?: A Lot Help from another person to put on and taking off regular upper body clothing?: A Lot Help from another person to put on and taking off  regular lower body clothing?: A Lot 6 Click Score: 14    End of Session Equipment Utilized During Treatment: Gait belt;Other (comment) (STEDY)  OT Visit Diagnosis: Unsteadiness on feet (R26.81);Other abnormalities of gait and mobility (R26.89);Muscle weakness (generalized) (M62.81);Pain   Activity Tolerance Patient tolerated treatment well   Patient Left in chair;with call bell/phone within reach   Nurse Communication Mobility status        Time: 1610-9604 OT Time Calculation (min): 17 min  Charges: OT General Charges $OT Visit: 1 Visit OT Treatments $Self Care/Home Management : 8-22 mins  Rosalio Loud, MS Acute Rehabilitation Department Office# [phone]   Selinda Flavin 09/29/2022, 12:33 PM

## 2022-09-29 NOTE — Progress Notes (Signed)
PROGRESS NOTE  Erin Ingram ZOX:[id number] DOB: May 11, 1940 DOA: 09/21/2022 PCP: Mast, Man X, NP   LOS: 8 days   Brief Narrative / Interim history: 82 year old with past medical history significant for microcytic anemia, osteoarthritis, stage III CKD, depression, GERD, hiatal hernia, hyperlipidemia, hypothyroidism, obesity, OSA on CPAP, polymyalgia rheumatica, history of multiple episode of pneumonia, subclavian artery stenosis, TIA, CAD, CABG chronic diastolic heart failure who presents [facility] with progressive worsening shortness of breath, lower extremity edema, orthopnea cough, and found to be hypoxic oxygen as low as 86%.  Symptoms started a week ago. Patient admitted for acute hypoxic respiratory failure in the setting of acute diastolic heart failure exacerbation.  She was initially started on BiPAP .  Renal function today, Cr increase to 1.5. will hold lasix. SBP soft this afternoon, will hold entresto tonight.   Subjective / 24h Interval events: She is sleepy today. She denies worsening dyspnea.  She will aet breakfast.     Assesement and Plan: Principal Problem:   Acute on chronic diastolic heart failure (HCC) Active Problems:   Vitamin B12 deficiency anemia   Essential hypertension   PAD (peripheral artery disease) (HCC)   Hyperlipidemia LDL goal <70   CAD (coronary artery disease)   GERD (gastroesophageal reflux disease)   Hypothyroidism   Memory loss   Elevated troponin   Acute pulmonary edema (HCC)   HFrEF (heart failure with reduced ejection fraction) (HCC)   Shortness of breath   Goals of care, counseling/discussion   Palliative care encounter   High risk medication use   Need for emotional support   Counseling and coordination of care   Medication management    1-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  Developing small pleural effusion.  BNP at 1700, elevation of troponin. -Cardiology consulted appreciate input, they sign off.  -2D echo shows worsening LV function, cardiology recommending medical management.   -Started Entresto, placed back on IV diuretics since chest x-ray 7/07  showed persistent fluid overload -Clinically improved.  Continue goal-directed medical therapy -Hold lasix, cr increase to 1.5 -SBP soft this afternoon, hold entresto.   Acute Hypoxic Respiratory Failure, In setting HF and presume COPD exacerbation. PNA - Reports history of smoking, but quit a long time ago.  Has been started on nebulizers, IV steroids, continue, continue steroids, will need to taper Change prednisone to 10 mg starting 7/10.  CAD, elevation of troponin -Elevation of troponin in the setting of heart failure exacerbation.  Medical management per cardiology   Leukocytosis -Chest x-ray: With patchy opacities in the bases which may be due to additional edema or superimposed pneumonia.  Status post 5 days of antibiotics   B12 deficiency anemia - Continue with B12 supplement.   Essential hypertension - Continue regimen as below   PAD - Continue with Lipitor and Plavix.   Hyperlipidemia - Continue with Lipitor   GERD - Continue with PPI   Hypothyroidism - Continue with Synthroid   Memory loss, dementia - Continue with Namenda   Polymyalgia Rheumatica - On Chronic prednisone 5 mg daily. Holding while on IV steroid.    Hypokalemia, Hypomagnesemia - replete and monitor   Scheduled Meds:  atorvastatin  10 mg Oral q1800   budesonide (PULMICORT) nebulizer solution  0.25 mg Nebulization BID   Chlorhexidine Gluconate Cloth  6 each Topical Daily   clopidogrel  75 mg Oral Daily   cyanocobalamin  1,000 mcg Oral Daily   dapagliflozin propanediol  10 mg Oral Daily   enoxaparin (LOVENOX) injection  40  mg Subcutaneous Q24H   ipratropium-albuterol  3 mL Nebulization BID   levothyroxine  75 mcg Oral Q0600   melatonin  5 mg Oral Q2000   memantine  10 mg Oral BID   metoCLOPramide  5 mg Oral BID   metoprolol succinate  25 mg Oral Daily   mirabegron ER  50 mg Oral Daily   mouth rinse  15 mL Mouth Rinse 4 times per day   pantoprazole  40 mg Oral BID   predniSONE  20 mg Oral Q breakfast   spironolactone  12.5 mg Oral Daily   venlafaxine XR  150 mg Oral Daily   Continuous Infusions:   PRN Meds:.acetaminophen **OR** acetaminophen, guaiFENesin-dextromethorphan, ipratropium-albuterol, loperamide, nitroGLYCERIN, mouth rinse, oxyCODONE  Current Outpatient Medications  Medication Instructions   acetaminophen (TYLENOL) 1,000 mg, Oral, 2 times daily PRN   alum & mag hydroxide-simeth (MAALOX PLUS) 400-400-40 MG/5ML suspension 10 mLs, Oral, As needed   atorvastatin (LIPITOR) 10 MG tablet TAKE 1 TABLET ONCE DAILY.   Calcium-Phosphorus-Vitamin D (CALCIUM/VITAMIN D3/ADULT GUMMY) 250-100-500 MG-MG-UNIT CHEW 1 Piece of gum, Oral, 2 times daily   clopidogrel (PLAVIX) 75 MG tablet TAKE 1 TABLET ONCE DAILY.   COENZYME Q-10 PO 10 mg, Oral, Daily at bedtime   cyanocobalamin 1,000 mcg, Oral, Daily   diclofenac Sodium (VOLTAREN) 4 g, Topical, 4 times daily   docusate sodium (COLACE) 100 mg, Oral, Daily PRN   fluticasone (FLONASE) 50 MCG/ACT nasal spray 1 spray, Each Nare, Daily PRN   furosemide (LASIX) 20 mg, Oral, Daily   isosorbide mononitrate (IMDUR) 120 mg, Oral, Daily   ketoconazole (NIZORAL) 2 % shampoo 1 Application, Topical, 2 times weekly, Tuesday and Friday   levocetirizine (XYZAL) 5 MG tablet TAKE 1 TABLET ONCE DAILY IN THE EVENING.   levothyroxine (SYNTHROID) 75 MCG tablet TAKE 1 TABLET ONCE DAILY ON EMPTY STOMACH.   losartan (COZAAR) 12.5 mg, Oral, Daily   melatonin 1 mg, Oral, Daily at bedtime   memantine (NAMENDA) 10 mg, Oral, Nightly   Menthol, Topical Analgesic, (BIOFREEZE) 10 % LIQD 1  Application, Apply externally, As needed   methocarbamol (ROBAXIN) 750 mg, Oral, Every 6 hours PRN   metoCLOPramide (REGLAN) 5 mg, Oral, 2 times daily   metoprolol succinate (TOPROL-XL) 25 mg, Oral, Daily, Take with or immediately following a meal.    miconazole (MICOTIN) 2 % powder Topical, 2 times daily,  Apply to groin and pannus topically   MYRBETRIQ 50 MG TB24 tablet TAKE 1 TABLET BY MOUTH DAILY.   nitroGLYCERIN (NITROSTAT) 0.4 mg, Sublingual, Every 5 min PRN, X 3 doses    pantoprazole (PROTONIX) 40 mg, Oral, 2 times daily   potassium chloride (MICRO-K) 10 MEQ CR capsule 20 mEq, Oral, 2 times daily   predniSONE (DELTASONE) 5 mg, Oral, Daily   PSYLLIUM HUSK PO 0.4 g, Oral, Daily, May keep at bedside    ranolazine (RANEXA) 1000 MG SR tablet TAKE 1 TABLET BY MOUTH TWICE DAILY.   venlafaxine (EFFEXOR) 150 mg, Oral, Daily   Vitamin D 2,000 Units, Oral, Daily   zinc oxide 20 % ointment 1 application , Topical, As needed    Diet Orders (From admission, onward)     Start     Ordered   09/21/22 0619  Diet Heart Room service appropriate? Yes; Fluid  consistency: Thin  Diet effective now       Question Answer Comment  Room service appropriate? Yes   Fluid consistency: Thin      09/21/22 0618            DVT prophylaxis: enoxaparin (LOVENOX) injection 40 mg Start: 09/25/22 1000 SCDs Start: 09/21/22 1610   Lab Results  Component Value Date   PLT 319 09/26/2022      Code Status: DNR  Family Communication: Son over phone  Status is: Inpatient Remains inpatient appropriate because: severity of illness  Level of care: Progressive  Consultants:  Cardiology   Objective: Vitals:   09/29/22 0500 09/29/22 0711 09/29/22 0714 09/29/22 1336  BP:    (!) 90/48  Pulse:    72  Resp:    20  Temp:      TempSrc:      SpO2:  96% 96% 96%  Weight: 81.8 kg     Height:        Intake/Output Summary (Last 24 hours) at 09/29/2022 1410 Last data filed at 09/29/2022 1100 Gross per 24 hour   Intake 890 ml  Output 1000 ml  Net -110 ml    Wt Readings from Last 3 Encounters:  09/29/22 81.8 kg  09/09/22 86.3 kg  09/03/22 86.2 kg    Examination:  Constitutional: NAD Respiratory: CTA, Normal Respiratory effort.  Cardiovascular: S 1, S 2 RRR Abdomen: BS present, soft, nt,  Extremities; no edema.   Data Reviewed: I have independently reviewed following labs and imaging studies   CBC Recent Labs  Lab 09/23/22 0323 09/24/22 0305 09/25/22 0339 09/26/22 0338  WBC 16.9* 19.2* 19.9* 15.9*  HGB 8.6* 9.6* 9.6* 9.5*  HCT 28.5* 31.4* 31.8* 30.5*  PLT 292 339 350 319  MCV 106.3* 105.4* 105.6* 104.5*  MCH 32.1 32.2 31.9 32.5  MCHC 30.2 30.6 30.2 31.1  RDW 13.5 13.4 13.3 13.3     Recent Labs  Lab 09/24/22 0305 09/25/22 0339 09/26/22 0338 09/27/22 0343 09/28/22 0659 09/29/22 0746  NA 137 139 138 139 134* 135  K 3.5 3.7 3.9 3.7 3.9 3.7  CL 105 104 105 102 97* 96*  CO2 22 24 24 25 26 27   GLUCOSE 133* 103* 159* 126* 130* 107*  BUN 46* 39* 32* 38* 45* 60*  CREATININE 1.33* 1.13* 1.13* 1.23* 1.31* 1.56*  CALCIUM 8.1* 8.7* 9.0 9.3 9.7 9.6  AST 15 15  --   --   --   --   ALT 12 12  --   --   --   --   ALKPHOS 42 43  --   --   --   --   BILITOT 0.5 0.4  --   --   --   --   ALBUMIN 2.7* 2.7*  --   --   --   --   MG 2.3 2.2 1.9 2.0 1.6* 2.2     ------------------------------------------------------------------------------------------------------------------ No results for input(s): "CHOL", "HDL", "LDLCALC", "TRIG", "CHOLHDL", "LDLDIRECT" in the last 72 hours.  Lab Results  Component Value Date   HGBA1C 5.1 02/14/2019   ------------------------------------------------------------------------------------------------------------------ No results for input(s): "TSH", "T4TOTAL", "T3FREE", "THYROIDAB" in the last 72 hours.  Invalid input(s): "FREET3"  Cardiac Enzymes No results for input(s): "CKMB", "TROPONINI", "MYOGLOBIN" in the last 168 hours.  Invalid  input(s): "CK" ------------------------------------------------------------------------------------------------------------------    Component Value Date/Time   BNP 1,723.8 (H) 09/21/2022 0423    CBG: No results for input(s): "GLUCAP" in the last 168 hours.  Recent Results (from the past 240 hour(s))  Resp panel by RT-PCR (RSV, Flu A&B, Covid) Anterior Nasal Swab     Status: None   Collection Time: 09/21/22  4:23 AM   Specimen: Anterior Nasal Swab  Result Value Ref Range Status   SARS Coronavirus 2 by RT PCR NEGATIVE NEGATIVE Final    Comment: (NOTE) SARS-CoV-2 target nucleic acids are NOT DETECTED.  The SARS-CoV-2 RNA is generally detectable in upper respiratory specimens during the acute phase of infection. The lowest concentration of SARS-CoV-2 viral copies this assay can detect is 138 copies/mL. A negative result does not preclude SARS-Cov-2 infection and should not be used as the sole basis for treatment or other patient management decisions. A negative result may occur with  improper specimen collection/handling, submission of specimen other than nasopharyngeal swab, presence of viral mutation(s) within the areas targeted by this assay, and inadequate number of viral copies(<138 copies/mL). A negative result must be combined with clinical observations, patient history, and epidemiological information. The expected result is Negative.  Fact Sheet for Patients:  BloggerCourse.com  Fact Sheet for Healthcare Providers:  SeriousBroker.it  This test is no t yet approved or cleared by the [place] FDA and  has been authorized for detection and/or diagnosis of SARS-CoV-2 by FDA under an Emergency Use Authorization (EUA). This EUA will remain  in effect (meaning this test can be used) for the duration of the COVID-19 declaration under Section 564(b)(1) of the Act, 21 U.S.C.section 360bbb-3(b)(1), unless the authorization is  terminated  or revoked sooner.       Influenza A by PCR NEGATIVE NEGATIVE Final   Influenza B by PCR NEGATIVE NEGATIVE Final    Comment: (NOTE) The Xpert Xpress SARS-CoV-2/FLU/RSV plus assay is intended as an aid in the diagnosis of influenza from Nasopharyngeal swab specimens and should not be used as a sole basis for treatment. Nasal washings and aspirates are unacceptable for Xpert Xpress SARS-CoV-2/FLU/RSV testing.  Fact Sheet for Patients: BloggerCourse.com  Fact Sheet for Healthcare Providers: SeriousBroker.it  This test is not yet approved or cleared by the [place] FDA and has been authorized for detection and/or diagnosis of SARS-CoV-2 by FDA under an Emergency Use Authorization (EUA). This EUA will remain in effect (meaning this test can be used) for the duration of the COVID-19 declaration under Section 564(b)(1) of the Act, 21 U.S.C. section 360bbb-3(b)(1), unless the authorization is terminated or revoked.     Resp Syncytial Virus by PCR NEGATIVE NEGATIVE Final    Comment: (NOTE) Fact Sheet for Patients: BloggerCourse.com  Fact Sheet for Healthcare Providers: SeriousBroker.it  This test is not yet approved or cleared by the [place] FDA and has been authorized for detection and/or diagnosis of SARS-CoV-2 by FDA under an Emergency Use Authorization (EUA). This EUA will remain in effect (meaning this test can be used) for the duration of the COVID-19 declaration under Section 564(b)(1) of the Act, 21 U.S.C. section 360bbb-3(b)(1), unless the authorization is terminated or revoked.  Performed at [facility], [address] [place], [place] [address]   MRSA Next Gen by PCR, Nasal     Status: None   Collection Time: 09/21/22 12:24 PM   Specimen: Nasal Mucosa; Nasal Swab  Result Value Ref Range Status   MRSA by PCR Next Gen NOT  DETECTED NOT DETECTED Final    Comment: (NOTE) The GeneXpert MRSA Assay (FDA approved for NASAL specimens only), is one component of a comprehensive MRSA colonization surveillance program. It is  not intended to diagnose MRSA infection nor to guide or monitor treatment for MRSA infections. Test performance is not FDA approved in patients less than 69 years old. Performed at [facility], [address], [place], [place] [address]      Radiology Studies: No results found.   Alba Cory,  MD [facility]  Between 7 am - 7 pm I am available, please contact me via Amion (for emergencies) or Securechat (non urgent messages)  Between 7 pm - 7 am I am not available, please contact night coverage MD/APP via Amion

## 2022-09-29 NOTE — Care Management Important Message (Signed)
Important Message  Patient Details IM Letter given. Name: Erin Ingram MRN: [id number] Date of Birth: [birth year]   Medicare Important Message Given:  Yes     [name], [name] 09/29/2022, 10:45 AM

## 2022-09-29 NOTE — TOC Progression Note (Signed)
Transition of Care [facility]) - Progression Note    Patient Details  Name: Erin Ingram MRN: [id number] Date of Birth: [birth year]  Transition of Care [facility]) CM/SW Contact  Catalina Gravel, [place] Phone Number: 09/29/2022, 12:00 PM  Clinical Narrative:    CSW reviewed chart, Auth for return to [facility] in file.  Secure chat to MD advising pt  will return to SNF once DC ready. CSW visited pt at bedside, confirmed pt plan to return to [facility].  CSW shared that transportation will be arranged for her when ready and TOC will  share with pt son. Pt requested both sons be called when she is DC.   CSW received a call from Katie at [facility]- questioned Berkley Harvey has end date 7/10. CSW shared that as long as pt is DC tomorrow- on 7/10 the auth is valid.  TOC will continue to follow.     Expected Discharge Plan: [facility]illed Nursing Facility ([facility]) Barriers to Discharge: Continued Medical Work up  Expected Discharge Plan and Services In-house Referral: NA Discharge Planning Services: NA   Living arrangements for the past 2 months: Assisted Living Facility                                       Social Determinants of Health (SDOH) Interventions SDOH Screenings   Food Insecurity: No Food Insecurity (09/21/2022)  Housing: Low Risk  (09/21/2022)  Transportation Needs: No Transportation Needs (09/21/2022)  Utilities: Not At Risk (09/21/2022)  Financial Resource Strain: Low Risk  (12/07/2017)  Physical Activity: Inactive (12/07/2017)  Social Connections: Somewhat Isolated (12/07/2017)  Stress: Stress Concern Present (12/07/2017)  Tobacco Use: Medium Risk (09/21/2022)    Readmission Risk Interventions    09/22/2022    1:02 PM  Readmission Risk Prevention Plan  Transportation Screening Complete  HRI or Home Care Consult Complete  Social Work Consult for Recovery Care Planning/Counseling Complete  Palliative Care Screening Not Applicable

## 2022-09-29 NOTE — Progress Notes (Signed)
Pt placed on CPAP for night rest with oxygen bled in.  Tolerating well.

## 2022-09-29 NOTE — Progress Notes (Signed)
   09/29/22 0000  BiPAP/CPAP/SIPAP  Reason BIPAP/CPAP not in use Non-compliant

## 2022-09-30 ENCOUNTER — Inpatient Hospital Stay (HOSPITAL_COMMUNITY): Payer: Medicare Other

## 2022-09-30 ENCOUNTER — Telehealth: Payer: Self-pay

## 2022-09-30 DIAGNOSIS — I5033 Acute on chronic diastolic (congestive) heart failure: Secondary | ICD-10-CM | POA: Diagnosis not present

## 2022-09-30 LAB — URINALYSIS, ROUTINE W REFLEX MICROSCOPIC
Bilirubin Urine: NEGATIVE
Glucose, UA: 50 mg/dL — AB
Hgb urine dipstick: NEGATIVE
Ketones, ur: NEGATIVE mg/dL
Leukocytes,Ua: NEGATIVE
Nitrite: NEGATIVE
Protein, ur: NEGATIVE mg/dL
Specific Gravity, Urine: 1.012 (ref 1.005–1.030)
pH: 5 (ref 5.0–8.0)

## 2022-09-30 LAB — BASIC METABOLIC PANEL
Anion gap: 12 (ref 5–15)
BUN: 70 mg/dL — ABNORMAL HIGH (ref 8–23)
CO2: 25 mmol/L (ref 22–32)
Calcium: 9.4 mg/dL (ref 8.9–10.3)
Chloride: 95 mmol/L — ABNORMAL LOW (ref 98–111)
Creatinine, Ser: 2.14 mg/dL — ABNORMAL HIGH (ref 0.44–1.00)
GFR, Estimated: 22 mL/min — ABNORMAL LOW (ref >60)
Glucose, Bld: 108 mg/dL — ABNORMAL HIGH (ref 70–99)
Potassium: 4.2 mmol/L (ref 3.5–5.1)
Sodium: 132 mmol/L — ABNORMAL LOW (ref 135–145)

## 2022-09-30 LAB — CBC
HCT: 33.9 % — ABNORMAL LOW (ref 36.0–46.0)
Hemoglobin: 10.3 g/dL — ABNORMAL LOW (ref 12.0–15.0)
MCH: 31.7 pg (ref 26.0–34.0)
MCHC: 30.4 g/dL (ref 30.0–36.0)
MCV: 104.3 fL — ABNORMAL HIGH (ref 80.0–100.0)
Platelets: 363 10*3/uL (ref 150–400)
RBC: 3.25 MIL/uL — ABNORMAL LOW (ref 3.87–5.11)
RDW: 13.2 % (ref 11.5–15.5)
WBC: 20 10*3/uL — ABNORMAL HIGH (ref 4.0–10.5)
nRBC: 0 % (ref 0.0–0.2)

## 2022-09-30 MED ORDER — LACTATED RINGERS IV SOLN: INTRAVENOUS | Status: AC

## 2022-09-30 NOTE — TOC Progression Note (Addendum)
Transition of Care [facility]) - Progression Note    Patient Details  Name: Erin Ingram MRN: [id number] Date of Birth: 1940-09-22  Transition of Care [facility]) CM/SW Contact  Geni Bers, RN Phone Number: 09/30/2022, 9:03 AM  Clinical Narrative:    SNF made aware that pt will not return today. Insurance authorization will need to be restarted.    Expected Discharge Plan: [facility]illed Nursing Facility ([facility]) Barriers to Discharge: Continued Medical Work up  Expected Discharge Plan and Services In-house Referral: NA Discharge Planning Services: NA   Living arrangements for the past 2 months: Assisted Living Facility                                       Social Determinants of Health (SDOH) Interventions SDOH Screenings   Food Insecurity: No Food Insecurity (09/21/2022)  Housing: Low Risk  (09/21/2022)  Transportation Needs: No Transportation Needs (09/21/2022)  Utilities: Not At Risk (09/21/2022)  Financial Resource Strain: Low Risk  (12/07/2017)  Physical Activity: Inactive (12/07/2017)  Social Connections: Somewhat Isolated (12/07/2017)  Stress: Stress Concern Present (12/07/2017)  Tobacco Use: Medium Risk (09/21/2022)    Readmission Risk Interventions    09/22/2022    1:02 PM  Readmission Risk Prevention Plan  Transportation Screening Complete  HRI or Home Care Consult Complete  Social Work Consult for Recovery Care Planning/Counseling Complete  Palliative Care Screening Not Applicable

## 2022-09-30 NOTE — Progress Notes (Signed)
PROGRESS NOTE  Erin Ingram ZOX:[id number] DOB: May 05, 1940 DOA: 09/21/2022 PCP: Mast, Man X, NP   LOS: 9 days   Brief Narrative / Interim history: 82 year old with past medical history significant for microcytic anemia, osteoarthritis, stage III CKD, depression, GERD, hiatal hernia, hyperlipidemia, hypothyroidism, obesity, OSA on CPAP, polymyalgia rheumatica, history of multiple episode of pneumonia, subclavian artery stenosis, TIA, CAD, CABG chronic diastolic heart failure who presents from [facility] facility with progressive worsening shortness of breath, lower extremity edema, orthopnea cough, and found to be hypoxic oxygen as low as 86%.  Symptoms started a week ago. Patient admitted for acute hypoxic respiratory failure in the setting of acute diastolic heart failure exacerbation.  She was initially started on BiPAP .  She develops AKI on 7/09. Lasix discontinue 7/09. Plan to discontinue entresto, spironolactone and  [place].   Subjective / 24h Interval events: She is alert, denies dyspnea.  We discussed about drinking more fluids today due to worsening renal function.      Assesement and Plan: Principal Problem:   Acute on chronic diastolic heart failure (HCC) Active Problems:   Vitamin B12 deficiency anemia   Essential hypertension   PAD (peripheral artery disease) (HCC)   Hyperlipidemia LDL goal <70   CAD (coronary artery disease)   GERD (gastroesophageal reflux disease)   Hypothyroidism   Memory loss   Elevated troponin   Acute pulmonary edema (HCC)   HFrEF (heart failure with reduced ejection fraction) (HCC)   Shortness of breath   Goals of care, counseling/discussion   Palliative care encounter   High risk medication use   Need for emotional support   Counseling and coordination of care   Medication management    1-Acute on chronic combined systolic and diastolic heart failure exacerbation -Patient presented with worsening shortness of  breath, lower extremity edema, chest x-ray with finding consistent with fluid overload and pery hilar interstitial and groundglass edema.  Developing small pleural effusion.  BNP at 1700, elevation of troponin. -Cardiology consulted appreciate input, they sign off.  -2D echo shows worsening LV function, cardiology recommending medical management.   -Continue to Hold lasix, Cr today at 2.  -Hold Entresto and Farxiga/   AKI:  Suspect related to Over diuresis and Hypotension.  Continue to hold lasix.  Check Renal US, urine out out.  Hold entresto, spironolactone, farxiga.  Repeat renal function in am.  Will give 4 hours of low rate IV fluids.   Acute Hypoxic Respiratory Failure, In setting HF and presume COPD exacerbation. PNA - Reports history of smoking, but quit a long time ago.   -Treated with  nebulizers, IV steroids.  -Change prednisone to 10 mg starting 7/10.  CAD, elevation of troponin -Elevation of troponin in the setting of heart failure exacerbation.  Medical management per cardiology   Leukocytosis -Chest x-ray: With patchy opacities in the bases which may be due to additional edema or superimposed pneumonia.  Status post 5 days of antibiotics   B12 deficiency anemia - Continue with B12 supplement.   Essential hypertension - Continue regimen as below   PAD - Continue with Lipitor and Plavix.   Hyperlipidemia - Continue with Lipitor   GERD - Continue with PPI   Hypothyroidism - Continue with Synthroid   Memory loss, dementia - Continue with Namenda   Polymyalgia Rheumatica - On Chronic prednisone 5 mg daily. Holding while on IV steroid.    Hypokalemia, Hypomagnesemia - replete and monitor   Scheduled Meds:  atorvastatin  10  mg Oral q1800   budesonide (PULMICORT) nebulizer solution  0.25 mg Nebulization BID   clopidogrel  75 mg Oral Daily   cyanocobalamin  1,000 mcg Oral Daily   enoxaparin (LOVENOX) injection  30 mg Subcutaneous Q24H   ipratropium-albuterol  3 mL  Nebulization BID   levothyroxine  75 mcg Oral Q0600   melatonin  5 mg Oral Q2000   memantine  10 mg Oral BID   metoCLOPramide  5 mg Oral BID   metoprolol succinate  25 mg Oral Daily   mirabegron ER  50 mg Oral Daily   mouth rinse  15 mL Mouth Rinse 4 times per day   pantoprazole  40 mg Oral BID   predniSONE  10 mg Oral Q breakfast   venlafaxine XR  150 mg Oral Daily   Continuous Infusions:  lactated ringers 45 mL/hr at 09/30/22 1235    PRN Meds:.acetaminophen **OR** acetaminophen, guaiFENesin-dextromethorphan, ipratropium-albuterol, loperamide, nitroGLYCERIN, mouth rinse, oxyCODONE  Current Outpatient Medications  Medication Instructions   acetaminophen (TYLENOL) 1,000 mg, Oral, 2 times daily PRN   alum & mag hydroxide-simeth (MAALOX PLUS) 400-400-40 MG/5ML suspension 10 mLs, Oral, As needed   atorvastatin (LIPITOR) 10 MG tablet TAKE 1 TABLET ONCE DAILY.   Calcium-Phosphorus-Vitamin D (CALCIUM/VITAMIN D3/ADULT GUMMY) 250-100-500 MG-MG-UNIT CHEW 1 Piece of gum, Oral, 2 times daily   clopidogrel (PLAVIX) 75 MG tablet TAKE 1 TABLET ONCE DAILY.   COENZYME Q-10 PO 10 mg, Oral, Daily at bedtime   cyanocobalamin 1,000 mcg, Oral, Daily   diclofenac Sodium (VOLTAREN) 4 g, Topical, 4 times daily   docusate sodium (COLACE) 100 mg, Oral, Daily PRN   fluticasone (FLONASE) 50 MCG/ACT nasal spray 1 spray, Each Nare, Daily PRN   furosemide (LASIX) 20 mg, Oral, Daily   isosorbide mononitrate (IMDUR) 120 mg, Oral, Daily   ketoconazole (NIZORAL) 2 % shampoo 1 Application, Topical, 2 times weekly, Tuesday and Friday   levocetirizine (XYZAL) 5 MG tablet TAKE 1 TABLET ONCE DAILY IN THE EVENING.   levothyroxine (SYNTHROID) 75 MCG tablet TAKE 1 TABLET ONCE DAILY ON EMPTY STOMACH.   losartan (COZAAR) 12.5 mg, Oral, Daily   melatonin 1 mg, Oral, Daily at bedtime   memantine (NAMENDA) 10 mg, Oral, Nightly   Menthol, Topical Analgesic, (BIOFREEZE) 10 % LIQD 1 Application, Apply externally, As needed    methocarbamol (ROBAXIN) 750 mg, Oral, Every 6 hours PRN   metoCLOPramide (REGLAN) 5 mg, Oral, 2 times daily   metoprolol succinate (TOPROL-XL) 25 mg, Oral, Daily, Take with or immediately following a meal.    miconazole (MICOTIN) 2 % powder Topical, 2 times daily,  Apply to groin and pannus topically   MYRBETRIQ 50 MG TB24 tablet TAKE 1 TABLET BY MOUTH DAILY.   nitroGLYCERIN (NITROSTAT) 0.4 mg, Sublingual, Every 5 min PRN, X 3 doses    pantoprazole (PROTONIX) 40 mg, Oral, 2 times daily   potassium chloride (MICRO-K) 10 MEQ CR capsule 20 mEq, Oral, 2 times daily   predniSONE (DELTASONE) 5 mg, Oral, Daily   PSYLLIUM HUSK PO 0.4 g, Oral, Daily, May keep at bedside    ranolazine (RANEXA) 1000 MG SR tablet TAKE 1 TABLET BY MOUTH TWICE DAILY.   venlafaxine (EFFEXOR) 150 mg, Oral, Daily   Vitamin D 2,000 Units, Oral, Daily   zinc oxide 20 % ointment 1 application , Topical, As needed    Diet Orders (From admission, onward)     Start     Ordered   09/21/22 0619  Diet Heart Room service appropriate?  Yes; Fluid consistency: Thin  Diet effective now       Question Answer Comment  Room service appropriate? Yes   Fluid consistency: Thin      09/21/22 0618            DVT prophylaxis: enoxaparin (LOVENOX) injection 30 mg Start: 09/30/22 1000 SCDs Start: 09/21/22 1610   Lab Results  Component Value Date   PLT 319 09/26/2022      Code Status: DNR  Family Communication: Son over phone 7/09  Status is: Inpatient Remains inpatient appropriate because: severity of illness  Level of care: Progressive  Consultants:  Cardiology   Objective: Vitals:   09/30/22 0459 09/30/22 0600 09/30/22 0745 09/30/22 0942  BP: (!) 110/52   (!) 108/50  Pulse: 86   84  Resp: 18     Temp: 98.1 F (36.7 C)     TempSrc: Oral     SpO2: 96%  97%   Weight:  83.4 kg    Height:        Intake/Output Summary (Last 24 hours) at 09/30/2022 1310 Last data filed at 09/30/2022 1045 Gross per 24 hour   Intake 600 ml  Output 650 ml  Net -50 ml    Wt Readings from Last 3 Encounters:  09/30/22 83.4 kg  09/09/22 86.3 kg  09/03/22 86.2 kg    Examination:  Constitutional: NAD Respiratory: CTA Cardiovascular: S 1, S 2 RRR Abdomen: BS present, soft, nt Extremities; No edema  Data Reviewed: I have independently reviewed following labs and imaging studies   CBC Recent Labs  Lab 09/24/22 0305 09/25/22 0339 09/26/22 0338  WBC 19.2* 19.9* 15.9*  HGB 9.6* 9.6* 9.5*  HCT 31.4* 31.8* 30.5*  PLT 339 350 319  MCV 105.4* 105.6* 104.5*  MCH 32.2 31.9 32.5  MCHC 30.6 30.2 31.1  RDW 13.4 13.3 13.3     Recent Labs  Lab 09/24/22 0305 09/25/22 0339 09/26/22 0338 09/27/22 0343 09/28/22 0659 09/29/22 0746 09/30/22 0428  NA 137 139 138 139 134* 135 132*  K 3.5 3.7 3.9 3.7 3.9 3.7 4.2  CL 105 104 105 102 97* 96* 95*  CO2 22 24 24 25 26 27 25   GLUCOSE 133* 103* 159* 126* 130* 107* 108*  BUN 46* 39* 32* 38* 45* 60* 70*  CREATININE 1.33* 1.13* 1.13* 1.23* 1.31* 1.56* 2.14*  CALCIUM 8.1* 8.7* 9.0 9.3 9.7 9.6 9.4  AST 15 15  --   --   --   --   --   ALT 12 12  --   --   --   --   --   ALKPHOS 42 43  --   --   --   --   --   BILITOT 0.5 0.4  --   --   --   --   --   ALBUMIN 2.7* 2.7*  --   --   --   --   --   MG 2.3 2.2 1.9 2.0 1.6* 2.2  --      ------------------------------------------------------------------------------------------------------------------ No results for input(s): "CHOL", "HDL", "LDLCALC", "TRIG", "CHOLHDL", "LDLDIRECT" in the last 72 hours.  Lab Results  Component Value Date   HGBA1C 5.1 02/14/2019   ------------------------------------------------------------------------------------------------------------------ No results for input(s): "TSH", "T4TOTAL", "T3FREE", "THYROIDAB" in the last 72 hours.  Invalid input(s): "FREET3"  Cardiac Enzymes No results for input(s): "CKMB", "TROPONINI", "MYOGLOBIN" in the last 168 hours.  Invalid input(s):  "CK" ------------------------------------------------------------------------------------------------------------------    Component Value Date/Time   BNP  1,723.8 (H) 09/21/2022 0423    CBG: Recent Labs  Lab 09/29/22 1453  GLUCAP 148*    Recent Results (from the past 240 hour(s))  Resp panel by RT-PCR (RSV, Flu A&B, Covid) Anterior Nasal Swab     Status: None   Collection Time: 09/21/22  4:23 AM   Specimen: Anterior Nasal Swab  Result Value Ref Range Status   SARS Coronavirus 2 by RT PCR NEGATIVE NEGATIVE Final    Comment: (NOTE) SARS-CoV-2 target nucleic acids are NOT DETECTED.  The SARS-CoV-2 RNA is generally detectable in upper respiratory specimens during the acute phase of infection. The lowest concentration of SARS-CoV-2 viral copies this assay can detect is 138 copies/mL. A negative result does not preclude SARS-Cov-2 infection and should not be used as the sole basis for treatment or other patient management decisions. A negative result may occur with  improper specimen collection/handling, submission of specimen other than nasopharyngeal swab, presence of viral mutation(s) within the areas targeted by this assay, and inadequate number of viral copies(<138 copies/mL). A negative result must be combined with clinical observations, patient history, and epidemiological information. The expected result is Negative.  Fact Sheet for Patients:  BloggerCourse.com  Fact Sheet for Healthcare Providers:  SeriousBroker.it  This test is no t yet approved or cleared by the [place] FDA and  has been authorized for detection and/or diagnosis of SARS-CoV-2 by FDA under an Emergency Use Authorization (EUA). This EUA will remain  in effect (meaning this test can be used) for the duration of the COVID-19 declaration under Section 564(b)(1) of the Act, 21 U.S.C.section 360bbb-3(b)(1), unless the authorization is terminated  or  revoked sooner.       Influenza A by PCR NEGATIVE NEGATIVE Final   Influenza B by PCR NEGATIVE NEGATIVE Final    Comment: (NOTE) The Xpert Xpress SARS-CoV-2/FLU/RSV plus assay is intended as an aid in the diagnosis of influenza from Nasopharyngeal swab specimens and should not be used as a sole basis for treatment. Nasal washings and aspirates are unacceptable for Xpert Xpress SARS-CoV-2/FLU/RSV testing.  Fact Sheet for Patients: BloggerCourse.com  Fact Sheet for Healthcare Providers: SeriousBroker.it  This test is not yet approved or cleared by the [place] FDA and has been authorized for detection and/or diagnosis of SARS-CoV-2 by FDA under an Emergency Use Authorization (EUA). This EUA will remain in effect (meaning this test can be used) for the duration of the COVID-19 declaration under Section 564(b)(1) of the Act, 21 U.S.C. section 360bbb-3(b)(1), unless the authorization is terminated or revoked.     Resp Syncytial Virus by PCR NEGATIVE NEGATIVE Final    Comment: (NOTE) Fact Sheet for Patients: BloggerCourse.com  Fact Sheet for Healthcare Providers: SeriousBroker.it  This test is not yet approved or cleared by the [place] FDA and has been authorized for detection and/or diagnosis of SARS-CoV-2 by FDA under an Emergency Use Authorization (EUA). This EUA will remain in effect (meaning this test can be used) for the duration of the COVID-19 declaration under Section 564(b)(1) of the Act, 21 U.S.C. section 360bbb-3(b)(1), unless the authorization is terminated or revoked.  Performed at [facility], [address] [place] [address] [place], [place] [address]   MRSA Next Gen by PCR, Nasal     Status: None   Collection Time: 09/21/22 12:24 PM   Specimen: Nasal Mucosa; Nasal Swab  Result Value Ref Range Status   MRSA by PCR Next Gen NOT DETECTED NOT  DETECTED Final    Comment: (NOTE) The GeneXpert  MRSA Assay (FDA approved for NASAL specimens only), is one component of a comprehensive MRSA colonization surveillance program. It is not intended to diagnose MRSA infection nor to guide or monitor treatment for MRSA infections. Test performance is not FDA approved in patients less than 35 years old. Performed at [facility], [address], [place], [place] [address]      Radiology Studies: No results found.   Alba Cory,  MD [facility]  Between 7 am - 7 pm I am available, please contact me via Amion (for emergencies) or Securechat (non urgent messages)  Between 7 pm - 7 am I am not available, please contact night coverage MD/APP via Amion

## 2022-09-30 NOTE — Telephone Encounter (Signed)
Incoming call received from Mast, Man X, NP stating patient is in the hospital and we need to cancel MRI scheduled for tomorrow.  I tried to cancel via EPIC and was unable to due to appointment already scheduled. I called [facility] @ [phone], spoke with Bjorn Loser and canceled appointment

## 2022-09-30 NOTE — Progress Notes (Signed)
   09/30/22 1955  BiPAP/CPAP/SIPAP  Reason BIPAP/CPAP not in use Non-compliant (pt stated she wants to hold off)

## 2022-10-01 ENCOUNTER — Ambulatory Visit (HOSPITAL_COMMUNITY)
Admission: RE | Admit: 2022-10-01 | Discharge: 2022-10-01 | Disposition: A | Discharge status: Home / Self Care | Payer: Medicare Other | Source: Ambulatory Visit | Attending: Nurse Practitioner | Admitting: Nurse Practitioner

## 2022-10-01 DIAGNOSIS — I5033 Acute on chronic diastolic (congestive) heart failure: Secondary | ICD-10-CM | POA: Diagnosis not present

## 2022-10-01 LAB — CBC
HCT: 31.7 % — ABNORMAL LOW (ref 36.0–46.0)
Hemoglobin: 9.9 g/dL — ABNORMAL LOW (ref 12.0–15.0)
MCH: 31.7 pg (ref 26.0–34.0)
MCHC: 31.2 g/dL (ref 30.0–36.0)
MCV: 101.6 fL — ABNORMAL HIGH (ref 80.0–100.0)
Platelets: 338 10*3/uL (ref 150–400)
RBC: 3.12 MIL/uL — ABNORMAL LOW (ref 3.87–5.11)
RDW: 13 % (ref 11.5–15.5)
WBC: 15.9 10*3/uL — ABNORMAL HIGH (ref 4.0–10.5)
nRBC: 0 % (ref 0.0–0.2)

## 2022-10-01 LAB — BASIC METABOLIC PANEL
Anion gap: 10 (ref 5–15)
BUN: 58 mg/dL — ABNORMAL HIGH (ref 8–23)
CO2: 24 mmol/L (ref 22–32)
Calcium: 8.8 mg/dL — ABNORMAL LOW (ref 8.9–10.3)
Chloride: 97 mmol/L — ABNORMAL LOW (ref 98–111)
Creatinine, Ser: 1.53 mg/dL — ABNORMAL HIGH (ref 0.44–1.00)
GFR, Estimated: 34 mL/min — ABNORMAL LOW (ref >60)
Glucose, Bld: 90 mg/dL (ref 70–99)
Potassium: 3.9 mmol/L (ref 3.5–5.1)
Sodium: 131 mmol/L — ABNORMAL LOW (ref 135–145)

## 2022-10-01 NOTE — Progress Notes (Signed)
   10/01/22 2216  BiPAP/CPAP/SIPAP  BiPAP/CPAP/SIPAP Pt Type Adult  BiPAP/CPAP/SIPAP DREAMSTATIOND  Mask Type Nasal mask  Mask Size Small  Respiratory Rate 18 breaths/min  Flow Rate 2 lpm (bled into the circuit)  Patient Home Equipment No  Auto Titrate Yes (Automode: Imax=12 cm H2O, Emin=4 cm H2O, PS Range= 4-8 cm H2O)

## 2022-10-01 NOTE — Progress Notes (Signed)
PROGRESS NOTE  Erin KAGAN ZOX:[id number] DOB: 25-Dec-1940 DOA: 09/21/2022 PCP: Mast, Man X, NP   LOS: 10 days   Brief Narrative / Interim history: 82 year old with past medical history significant for microcytic anemia, osteoarthritis, stage III CKD, depression, GERD, hiatal hernia, hyperlipidemia, hypothyroidism, obesity, OSA on CPAP, polymyalgia rheumatica, history of multiple episode of pneumonia, subclavian artery stenosis, TIA, CAD, CABG chronic diastolic heart failure who presents from [facility] with progressive worsening shortness of breath, lower extremity edema, orthopnea cough, and found to be hypoxic oxygen as low as 86%.  Symptoms started a week ago. Patient admitted for acute hypoxic respiratory failure in the setting of acute diastolic heart failure exacerbation.  She was initially started on BiPAP .  She develops AKI on 7/09. Lasix discontinue 7/09. Plan to discontinue entresto, spironolactone and  [place]. Renal function improving.   Subjective / 24h Interval events: She is feeling well, appears more alert. Denies dyspnea.   Assesement and Plan: Principal Problem:   Acute on chronic diastolic heart failure (HCC) Active Problems:   Vitamin B12 deficiency anemia   Essential hypertension   PAD (peripheral artery disease) (HCC)   Hyperlipidemia LDL goal <70   CAD (coronary artery disease)   GERD (gastroesophageal reflux disease)   Hypothyroidism   Memory loss   Elevated troponin   Acute pulmonary edema (HCC)   HFrEF (heart failure with reduced ejection fraction) (HCC)   Shortness of breath   Goals of care, counseling/discussion   Palliative care encounter   High risk medication use   Need for emotional support   Counseling and coordination of care   Medication management    1-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  Developing small pleural effusion.  BNP at 1700, elevation of troponin. -Cardiology consulted appreciate input, they sign off.  -2D echo shows worsening LV function, cardiology recommending medical management.   -Continue to Hold lasix, due to increase in  Cr  at 2.  -Hold Entresto and Farxiga/  -plan to resume lasix tomorrow. Renal function improving.   AKI:  Suspect related to Over diuresis and Hypotension.  Continue to hold lasix.  Renal US,negative for hydronephrosis.  Hold entresto, spironolactone, farxiga.  Repeat renal function in am.  Renal function improved.   Acute Hypoxic Respiratory Failure, In setting HF and presume COPD exacerbation. PNA - Reports history of smoking, but quit a long time ago.   -Treated with  nebulizers, IV steroids.  -Change prednisone to 10 mg starting 7/10.  CAD, elevation of troponin -Elevation of troponin in the setting of heart failure exacerbation.  Medical management per cardiology   Leukocytosis -Chest x-ray: With patchy opacities in the bases which may be due to additional edema or superimposed pneumonia.  Status post 5 days of antibiotics   B12 deficiency anemia - Continue with B12 supplement.   Essential hypertension - Continue regimen as below   PAD - Continue with Lipitor and Plavix.   Hyperlipidemia - Continue with Lipitor   GERD - Continue with PPI   Hypothyroidism - Continue with Synthroid   Memory loss, dementia - Continue with Namenda   Polymyalgia Rheumatica - On Chronic prednisone 5 mg daily. Holding while on IV steroid.    Hypokalemia, Hypomagnesemia - replete and monitor   Scheduled Meds:  atorvastatin  10 mg Oral q1800  budesonide (PULMICORT) nebulizer solution  0.25 mg Nebulization BID   clopidogrel  75 mg Oral Daily   cyanocobalamin  1,000 mcg Oral Daily   enoxaparin (LOVENOX) injection  30 mg Subcutaneous Q24H    ipratropium-albuterol  3 mL Nebulization BID   levothyroxine  75 mcg Oral Q0600   melatonin  5 mg Oral Q2000   memantine  10 mg Oral BID   metoCLOPramide  5 mg Oral BID   metoprolol succinate  25 mg Oral Daily   mirabegron ER  50 mg Oral Daily   pantoprazole  40 mg Oral BID   predniSONE  10 mg Oral Q breakfast   venlafaxine XR  150 mg Oral Daily   Continuous Infusions:    PRN Meds:.acetaminophen **OR** acetaminophen, guaiFENesin-dextromethorphan, ipratropium-albuterol, loperamide, nitroGLYCERIN, mouth rinse, oxyCODONE  Current Outpatient Medications  Medication Instructions   acetaminophen (TYLENOL) 1,000 mg, Oral, 2 times daily PRN   alum & mag hydroxide-simeth (MAALOX PLUS) 400-400-40 MG/5ML suspension 10 mLs, Oral, As needed   atorvastatin (LIPITOR) 10 MG tablet TAKE 1 TABLET ONCE DAILY.   Calcium-Phosphorus-Vitamin D (CALCIUM/VITAMIN D3/ADULT GUMMY) 250-100-500 MG-MG-UNIT CHEW 1 Piece of gum, Oral, 2 times daily   clopidogrel (PLAVIX) 75 MG tablet TAKE 1 TABLET ONCE DAILY.   COENZYME Q-10 PO 10 mg, Oral, Daily at bedtime   cyanocobalamin 1,000 mcg, Oral, Daily   diclofenac Sodium (VOLTAREN) 4 g, Topical, 4 times daily   docusate sodium (COLACE) 100 mg, Oral, Daily PRN   fluticasone (FLONASE) 50 MCG/ACT nasal spray 1 spray, Each Nare, Daily PRN   furosemide (LASIX) 20 mg, Oral, Daily   isosorbide mononitrate (IMDUR) 120 mg, Oral, Daily   ketoconazole (NIZORAL) 2 % shampoo 1 Application, Topical, 2 times weekly, Tuesday and Friday   levocetirizine (XYZAL) 5 MG tablet TAKE 1 TABLET ONCE DAILY IN THE EVENING.   levothyroxine (SYNTHROID) 75 MCG tablet TAKE 1 TABLET ONCE DAILY ON EMPTY STOMACH.   losartan (COZAAR) 12.5 mg, Oral, Daily   melatonin 1 mg, Oral, Daily at bedtime   memantine (NAMENDA) 10 mg, Oral, Nightly   Menthol, Topical Analgesic, (BIOFREEZE) 10 % LIQD 1 Application, Apply externally, As needed   methocarbamol (ROBAXIN) 750 mg, Oral, Every 6 hours PRN    metoCLOPramide (REGLAN) 5 mg, Oral, 2 times daily   metoprolol succinate (TOPROL-XL) 25 mg, Oral, Daily, Take with or immediately following a meal.    miconazole (MICOTIN) 2 % powder Topical, 2 times daily,  Apply to groin and pannus topically   MYRBETRIQ 50 MG TB24 tablet TAKE 1 TABLET BY MOUTH DAILY.   nitroGLYCERIN (NITROSTAT) 0.4 mg, Sublingual, Every 5 min PRN, X 3 doses    pantoprazole (PROTONIX) 40 mg, Oral, 2 times daily   potassium chloride (MICRO-K) 10 MEQ CR capsule 20 mEq, Oral, 2 times daily   predniSONE (DELTASONE) 5 mg, Oral, Daily   PSYLLIUM HUSK PO 0.4 g, Oral, Daily, May keep at bedside    ranolazine (RANEXA) 1000 MG SR tablet TAKE 1 TABLET BY MOUTH TWICE DAILY.   venlafaxine (EFFEXOR) 150 mg, Oral, Daily   Vitamin D 2,000 Units, Oral, Daily   zinc oxide 20 % ointment 1 application , Topical, As needed    Diet Orders (From admission, onward)     Start     Ordered   09/21/22 0619  Diet Heart Room service appropriate? Yes; Fluid consistency: Thin  Diet effective now       Question Answer Comment  Room service appropriate? Yes   Fluid consistency:  Thin      09/21/22 0618            DVT prophylaxis: enoxaparin (LOVENOX) injection 30 mg Start: 09/30/22 1000 SCDs Start: 09/21/22 4098   Lab Results  Component Value Date   PLT 338 10/01/2022      Code Status: DNR  Family Communication: Son over phone 7/10   Status is: Inpatient Remains inpatient appropriate because: severity of illness  Level of care: Progressive  Consultants:  Cardiology   Objective: Vitals:   10/01/22 0516 10/01/22 0816 10/01/22 1040 10/01/22 1304  BP: (!) 106/52  (!) 106/54 (!) 107/51  Pulse: 74  65 70  Resp: 18   18  Temp: 98 F (36.7 C)   97.8 F (36.6 C)  TempSrc: Oral   Oral  SpO2: 100% 94%  97%  Weight: 83.6 kg     Height:        Intake/Output Summary (Last 24 hours) at 10/01/2022 1444 Last data filed at 10/01/2022 1443 Gross per 24 hour  Intake 440.65 ml  Output  1525 ml  Net -1084.35 ml   Wt Readings from Last 3 Encounters:  10/01/22 83.6 kg  09/09/22 86.3 kg  09/03/22 86.2 kg    Examination:  Constitutional: NAD Respiratory: CAT Cardiovascular: S 1, S 2 RRR Abdomen: BS presents, soft, nt Extremities;No edema.   Data Reviewed: I have independently reviewed following labs and imaging studies   CBC Recent Labs  Lab 09/25/22 0339 09/26/22 0338 09/30/22 0428 10/01/22 0854  WBC 19.9* 15.9* 20.0* 15.9*  HGB 9.6* 9.5* 10.3* 9.9*  HCT 31.8* 30.5* 33.9* 31.7*  PLT 350 319 363 338  MCV 105.6* 104.5* 104.3* 101.6*  MCH 31.9 32.5 31.7 31.7  MCHC 30.2 31.1 30.4 31.2  RDW 13.3 13.3 13.2 13.0    Recent Labs  Lab 09/25/22 0339 09/26/22 0338 09/27/22 0343 09/28/22 0659 09/29/22 0746 09/30/22 0428 10/01/22 0402  NA 139 138 139 134* 135 132* 131*  K 3.7 3.9 3.7 3.9 3.7 4.2 3.9  CL 104 105 102 97* 96* 95* 97*  CO2 24 24 25 26 27 25 24   GLUCOSE 103* 159* 126* 130* 107* 108* 90  BUN 39* 32* 38* 45* 60* 70* 58*  CREATININE 1.13* 1.13* 1.23* 1.31* 1.56* 2.14* 1.53*  CALCIUM 8.7* 9.0 9.3 9.7 9.6 9.4 8.8*  AST 15  --   --   --   --   --   --   ALT 12  --   --   --   --   --   --   ALKPHOS 43  --   --   --   --   --   --   BILITOT 0.4  --   --   --   --   --   --   ALBUMIN 2.7*  --   --   --   --   --   --   MG 2.2 1.9 2.0 1.6* 2.2  --   --     ------------------------------------------------------------------------------------------------------------------ No results for input(s): "CHOL", "HDL", "LDLCALC", "TRIG", "CHOLHDL", "LDLDIRECT" in the last 72 hours.  Lab Results  Component Value Date   HGBA1C 5.1 02/14/2019   ------------------------------------------------------------------------------------------------------------------ No results for input(s): "TSH", "T4TOTAL", "T3FREE", "THYROIDAB" in the last 72 hours.  Invalid input(s): "FREET3"  Cardiac Enzymes No results for input(s): "CKMB", "TROPONINI", "MYOGLOBIN" in the  last 168 hours.  Invalid input(s): "CK" ------------------------------------------------------------------------------------------------------------------    Component Value Date/Time   BNP 1,723.8 (H)  09/21/2022 0423    CBG: Recent Labs  Lab 09/29/22 1453  GLUCAP 148*    No results found for this or any previous visit (from the past 240 hour(s)).    Radiology Studies: US RENAL  Result Date: 09/30/2022 CLINICAL DATA:  [id number] AKI (acute kidney injury) [facility]) [id number] EXAM: RENAL / URINARY TRACT ULTRASOUND COMPLETE COMPARISON:  April 17, 2015 FINDINGS: Right Kidney: Renal measurements: 10.0 x 3.9 x 4.5 cm = volume: 90 mL. Echogenicity within normal limits. No mass or hydronephrosis visualized. Cortical thinning, mild. Left Kidney: Renal measurements: 10.1 x 5.1 x 3.2 cm = volume: 77 mL. Echogenicity within normal limits. No suspicious mass or hydronephrosis visualized. Exophytic cyst measuring up to 19 mm (for which no dedicated imaging follow-up is recommended). Mild cortical thinning. Bladder: Appears normal for degree of bladder distention. Other: None. IMPRESSION: No hydronephrosis. Electronically Signed   By: Meda Klinefelter M.D.   On: 09/30/2022 20:15     Kelli Robeck A Ilani Otterson,  MD [facility]  Between 7 am - 7 pm I am available, please contact me via Amion (for emergencies) or Securechat (non urgent messages)  Between 7 pm - 7 am I am not available, please contact night coverage MD/APP via Amion

## 2022-10-01 NOTE — Plan of Care (Signed)
  Problem: Health Behavior/Discharge Planning: Goal: Ability to manage health-related needs will improve Outcome: Progressing   Problem: Clinical Measurements: Goal: Cardiovascular complication will be avoided Outcome: Progressing   Problem: Activity: Goal: Risk for activity intolerance will decrease Outcome: Progressing   Problem: Nutrition: Goal: Adequate nutrition will be maintained Outcome: Progressing   Problem: Coping: Goal: Level of anxiety will decrease Outcome: Progressing   Problem: Elimination: Goal: Will not experience complications related to urinary retention Outcome: Progressing

## 2022-10-01 NOTE — TOC Progression Note (Addendum)
Transition of Care [facility]) - Progression Note    Patient Details  Name: Erin Ingram MRN: [id number] Date of Birth: 1940/09/12  [facility] CM/SW Contact  Howell Rucks, RN Phone Number: 10/01/2022, 9:04 AM  Clinical Narrative:   [facility] consulted for SNF Placement, anticipated dc 10/02/22, SNF auth needs to be restarted, last PT 7/8, NCM sent teams chat to PT requesting prioritize PT session today for initiation of insurance auth.  -11:40am [facility] SN[facility] auth initiated via [place]. Auth ID: [id number], authorization pending.   -3:39pm Teams chat received from attending, pt's son Crista Curb is only available to sign SNF admissions paperwork tomorrow before 3pm. NCM called to Miquel Dunn at [facility] at [facility], vm left requesting outreach to pt's son to arrange completing admissions paperwork.  NCM out reach to [facility], informed NCM left message for Florentina Addison with [facility] SNF to contact him to arrange admissions paperwork, voiced understanding, no further questions/concerns.     Expected Discharge Plan: Skilled Nursing Facility ([facility]) Barriers to Discharge: Continued Medical Work up  Expected Discharge Plan and Services In-house Referral: NA Discharge Planning Services: NA   Living arrangements for the past 2 months: Assisted Living Facility                                       Social Determinants of Health (SDOH) Interventions SDOH Screenings   Food Insecurity: No Food Insecurity (09/21/2022)  Housing: Low Risk  (09/21/2022)  Transportation Needs: No Transportation Needs (09/21/2022)  Utilities: Not At Risk (09/21/2022)  Financial Resource Strain: Low Risk  (12/07/2017)  Physical Activity: Inactive (12/07/2017)  Social Connections: Somewhat Isolated (12/07/2017)  Stress: Stress Concern Present (12/07/2017)  Tobacco Use: Medium Risk (09/21/2022)    Readmission Risk Interventions    09/22/2022    1:02 PM  Readmission Risk  Prevention Plan  Transportation Screening Complete  HRI or Home Care Consult Complete  Social Work Consult for Recovery Care Planning/Counseling Complete  Palliative Care Screening Not Applicable

## 2022-10-01 NOTE — Progress Notes (Addendum)
Physical Therapy Treatment Patient Details Name: Erin Ingram MRN: [id number] DOB: 1941-02-02 Today's Date: 10/01/2022   History of Present Illness Patient is a 82 year old female who presented on 7/1 with shortness of breath, cough, and BLE edema. Patient was also found to be hypoxic in ED. 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    PT Comments  Max assist for supine to sit, min assist sit to stand, pt unable to take any steps with RW in standing 2* 8/10 R lateral thigh pain (pt reports this pain is relatively new and only bothers her in weight bearing). Used [employer] to transfer to [employer]. Pt puts forth good effort but fatigues quickly. Patient will benefit from continued inpatient follow up therapy, <3 hours/day       Assistance Recommended at Discharge Frequent or constant Supervision/Assistance  If plan is discharge home, recommend the following:  Can travel by private vehicle    Two people to help with walking and/or transfers;A lot of help with bathing/dressing/bathroom;Help with stairs or ramp for entrance;Assist for transportation   No  Equipment Recommendations  None recommended by PT    Recommendations for Other Services       Precautions / Restrictions Precautions Precautions: Fall Restrictions Weight Bearing Restrictions: No     Mobility  Bed Mobility Overal bed mobility: Needs Assistance Bed Mobility: Supine to Sit     Supine to sit: Max assist     General bed mobility comments: assist to raise trunk and pivot hips to edge of bed    Transfers Overall transfer level: Needs assistance Equipment used: Rolling walker (2 wheels) Transfers: Sit to/from Stand Sit to Stand: Min assist           General transfer comment: sit to stand x 2, pt reported pain in R lateral thigh and was  unable to take any steps to transfer to recliner with RW, so used STEDY to transfer to recliner    Ambulation/Gait                   Stairs             Wheelchair Mobility     Tilt Bed    Modified Rankin (Stroke Patients Only)       Balance Overall balance assessment: Needs assistance Sitting-balance support: Feet supported, Bilateral upper extremity supported Sitting balance-Leahy Scale: Good     Standing balance support: Reliant on assistive device for balance, Bilateral upper extremity supported, During functional activity Standing balance-Leahy Scale: Poor                              Cognition Arousal/Alertness: Lethargic Behavior During Therapy: WFL for tasks assessed/performed Overall Cognitive Status: Within Functional Limits for tasks assessed                                 General Comments: able to answer questions with increased time, RN reports pt didn't sleep well last night        Exercises General Exercises - Lower Extremity Ankle Circles/Pumps: AROM, Both, 10 reps, Supine    General Comments        Pertinent Vitals/Pain Pain Assessment Pain Score: 8  Pain Location: R lateral thigh in standing Pain Descriptors / Indicators: Grimacing, Aching  Pain Intervention(s): Limited activity within patient's tolerance, Monitored during session, Patient requesting pain meds-RN notified    Home Living                          Prior Function            PT Goals (current goals can now be found in the care plan section) Acute Rehab PT Goals Patient Stated Goal: to go home PT Goal Formulation: With patient Time For Goal Achievement: 10/07/22 Potential to Achieve Goals: Fair Progress towards PT goals: Progressing toward goals    Frequency    Min 1X/week      PT Plan Current plan remains appropriate    Co-evaluation              AM-PAC PT "6 Clicks" Mobility   Outcome Measure  Help  needed turning from your back to your side while in a flat bed without using bedrails?: A Lot Help needed moving from lying on your back to sitting on the side of a flat bed without using bedrails?: A Lot Help needed moving to and from a bed to a chair (including a wheelchair)?: Total Help needed standing up from a chair using your arms (e.g., wheelchair or bedside chair)?: A Lot Help needed to walk in hospital room?: Total Help needed climbing 3-5 steps with a railing? : Total 6 Click Score: 9    End of Session Equipment Utilized During Treatment: Gait belt;Oxygen Activity Tolerance: Patient tolerated treatment well Patient left: in chair;with call bell/phone within reach;with chair alarm set Nurse Communication: Mobility status;Need for lift equipment PT Visit Diagnosis: Unsteadiness on feet (R26.81);Difficulty in walking, not elsewhere classified (R26.2);Pain Pain - Right/Left: Right Pain - part of body: Hip     Time: 1610-9604 PT Time Calculation (min) (ACUTE ONLY): 34 min  Charges:    $Therapeutic Activity: 23-37 mins PT General Charges $$ ACUTE PT VISIT: 1 Visit                    Tamala Ser PT 10/01/2022  Acute Rehabilitation Services  Office [phone]

## 2022-10-02 DIAGNOSIS — I5033 Acute on chronic diastolic (congestive) heart failure: Secondary | ICD-10-CM | POA: Diagnosis not present

## 2022-10-02 LAB — BASIC METABOLIC PANEL
Anion gap: 9 (ref 5–15)
BUN: 53 mg/dL — ABNORMAL HIGH (ref 8–23)
CO2: 28 mmol/L (ref 22–32)
Calcium: 9.1 mg/dL (ref 8.9–10.3)
Chloride: 98 mmol/L (ref 98–111)
Creatinine, Ser: 1.5 mg/dL — ABNORMAL HIGH (ref 0.44–1.00)
GFR, Estimated: 34 mL/min — ABNORMAL LOW (ref >60)
Glucose, Bld: 88 mg/dL (ref 70–99)
Potassium: 4 mmol/L (ref 3.5–5.1)
Sodium: 135 mmol/L (ref 135–145)

## 2022-10-02 MED ORDER — BUDESONIDE 0.25 MG/2ML IN SUSP: 0.2500 mg | Freq: Two times a day (BID) | RESPIRATORY_TRACT | 12 refills | Status: DC

## 2022-10-02 MED ORDER — IPRATROPIUM-ALBUTEROL 0.5-2.5 (3) MG/3ML IN SOLN: 3.0000 mL | Freq: Two times a day (BID) | RESPIRATORY_TRACT | 0 refills | Status: DC

## 2022-10-02 NOTE — Progress Notes (Signed)
PHYSICAL THERAPY   SATURATION QUALIFICATIONS: (This note is used to comply with regulatory documentation for home oxygen)  Patient Saturations on Room Air at Rest = 89%  Patient Saturations on Room Air while standing in STEDY 82%  Patient Saturations on 4 Liters of oxygen with activty (pre gait standing) = 91%  Please briefly explain why patient needs home oxygen:  Pt currently requires 2 lts supplemental oxygen at rest and 4 lts with activity to achieve therapeutic oxygen levels.  Felecia Shelling  PTA Acute  Rehabilitation Services Office M-F          [phone]

## 2022-10-02 NOTE — Progress Notes (Signed)
Physical Therapy Treatment Patient Details Name: Erin Ingram MRN: [id number] DOB: Mar 21, 1941 Today's Date: 10/02/2022   History of Present Illness Patient is a 82 year old female who presented on 7/1 with shortness of breath, cough, and BLE edema. Patient was also found to be hypoxic in ED. 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    PT Comments  Pt in bed on 2 lts at 96%.  Assisted OOB to recliner while monitoring sats.  General bed mobility comments: assist to raise trunk and scoot hips to edge of bed was difficult.  General transfer comment: required Max Assist to rise from elevated bed on STEDY.  Used STEDY to roll pt to recliner.  VC's on safety and hand placement. Positioned to comfort.     SATURATION QUALIFICATIONS: (This note is used to comply with regulatory documentation for home oxygen)   Patient Saturations on Room Air at Rest = 89%   Patient Saturations on Room Air while standing in STEDY 82%   Patient Saturations on 4 Liters of oxygen with activty (pre gait standing) = 91%   Please briefly explain why patient needs home oxygen:  Pt currently requires 2 lts supplemental oxygen at rest and 4 lts with activity to achieve therapeutic oxygen levels.  Pt will need ST Rehab at SNF to address mobility and functional decline prior to safely returning to ALF level.       Assistance Recommended at Discharge Frequent or constant Supervision/Assistance  If plan is discharge home, recommend the following:  Can travel by private vehicle    Two people to help with walking and/or transfers;A lot of help with bathing/dressing/bathroom;Help with stairs or ramp for entrance;Assist for transportation   No  Equipment Recommendations  None recommended by PT    Recommendations for Other Services       Precautions  / Restrictions Precautions Precautions: Fall Restrictions Weight Bearing Restrictions: No     Mobility  Bed Mobility Overal bed mobility: Needs Assistance Bed Mobility: Supine to Sit     Supine to sit: Max assist     General bed mobility comments: assist to raise trunk and scoot hips to edge of bed was difficult.    Transfers Overall transfer level: Needs assistance Equipment used: None Transfers: Sit to/from Stand Sit to Stand: Max assist           General transfer comment: required Max Assist to rise from elevated bed on STEDY.  Used STEDY to roll pt to recliner.  VC's on safety and hand placement.    Ambulation/Gait               General Gait Details: NT   Stairs             Wheelchair Mobility     Tilt Bed    Modified Rankin (Stroke Patients Only)       Balance                                            Cognition Arousal/Alertness: Awake/alert, Lethargic Behavior During Therapy: WFL for tasks assessed/performed Overall Cognitive Status: Within Functional Limits for tasks assessed  General Comments: AxO x 3 sleepy/groggy.  Pleasant.  Resides at [facility].        Exercises      General Comments        Pertinent Vitals/Pain Pain Assessment Pain Assessment: Faces Faces Pain Scale: Hurts a little bit Pain Location: R lateral thigh in standing Pain Descriptors / Indicators: Grimacing, Aching Pain Intervention(s): Monitored during session    Home Living                          Prior Function            PT Goals (current goals can now be found in the care plan section) Progress towards PT goals: Progressing toward goals    Frequency    Min 1X/week      PT Plan Current plan remains appropriate    Co-evaluation              AM-PAC PT "6 Clicks" Mobility   Outcome Measure  Help needed turning from your back to your side while in  a flat bed without using bedrails?: A Lot Help needed moving from lying on your back to sitting on the side of a flat bed without using bedrails?: A Lot Help needed moving to and from a bed to a chair (including a wheelchair)?: A Lot Help needed standing up from a chair using your arms (e.g., wheelchair or bedside chair)?: A Lot Help needed to walk in hospital room?: Total Help needed climbing 3-5 steps with a railing? : Total 6 Click Score: 10    End of Session Equipment Utilized During Treatment: Gait belt;Oxygen Activity Tolerance: Patient tolerated treatment well Patient left: in chair;with call bell/phone within reach;with chair alarm set Nurse Communication: Mobility status;Need for lift equipment PT Visit Diagnosis: Unsteadiness on feet (R26.81);Difficulty in walking, not elsewhere classified (R26.2);Pain Pain - Right/Left: Right Pain - part of body: Hip     Time: 1025-1050 PT Time Calculation (min) (ACUTE ONLY): 25 min  Charges:    $Therapeutic Activity: 23-37 mins PT General Charges $$ ACUTE PT VISIT: 1 Visit                     Felecia Shelling  PTA Acute  Rehabilitation Services Office M-F          [phone]

## 2022-10-02 NOTE — Discharge Summary (Signed)
Physician Discharge Summary   Patient: Erin Ingram MRN: [id number] DOB: 20-Jan-1941  Admit date:     09/21/2022  Discharge date: 10/02/22  Discharge Physician: Alba Cory   PCP: Mast, Man X, NP   Recommendations at discharge:    Needs Close follow up with cardiology to further adjust HF medications. Has appointment 7/18 Palliative care follow up at SNF Needs Bmet to monitor renal function.  Needs to monitor volume status.   Discharge Diagnoses: Principal Problem:   Acute on chronic diastolic heart failure (HCC) Active Problems:   Vitamin B12 deficiency anemia   Essential hypertension   PAD (peripheral artery disease) (HCC)   Hyperlipidemia LDL goal <70   CAD (coronary artery disease)   GERD (gastroesophageal reflux disease)   Hypothyroidism   Memory loss   Elevated troponin   Acute pulmonary edema (HCC)   HFrEF (heart failure with reduced ejection fraction) (HCC)   Shortness of breath   Goals of care, counseling/discussion   Palliative care encounter   High risk medication use   Need for emotional support   Counseling and coordination of care   Medication management  Resolved Problems:   * No resolved hospital problems. *  Hospital Course: 82 year old with past medical history significant for microcytic anemia, osteoarthritis, stage III CKD, depression, GERD, hiatal hernia, hyperlipidemia, hypothyroidism, obesity, OSA on CPAP, polymyalgia rheumatica, history of multiple episode of pneumonia, subclavian artery stenosis, TIA, CAD, CABG chronic diastolic heart failure who [facility] with progressive worsening shortness of breath, lower extremity edema, orthopnea cough, and found to be hypoxic oxygen as low as 86%.  Symptoms started a week ago. Patient admitted for acute hypoxic respiratory failure in the setting of acute diastolic heart failure exacerbation.  She was initially started on BiPAP .   She develops AKI on 7/09.  Lasix discontinue 7/09. Plan to discontinue entresto, spironolactone and  [place]. Renal function improving.   Assessment and Plan: 1-Acute on chronic combined systolic and diastolic heart failure exacerbation -Patient presented with worsening shortness of breath, lower extremity edema, chest x-ray with finding consistent with fluid overload and pery hilar interstitial and groundglass edema.  Developing small pleural effusion.  BNP at 1700, elevation of troponin. -Cardiology consulted appreciate input, they sign off.  -2D echo shows worsening LV function, cardiology recommending medical management.   -Patient was started on Entresto and Farxiga, spironolactone, Lasix, but develops AKI. Cr increase to 2.4.  Renal function improving, Cr down to 1.5. Plan to resume 20 mg lasix at discharge. Close follow up with cardiology  -She is negative 10 L.   AKI:  Suspect related to Over diuresis and Hypotension.  Renal US,negative for hydronephrosis.  Continue to hold entresto, spironolactone, farxiga.  Renal function improved.  Will resume lasix at discharge.   Acute Hypoxic Respiratory Failure, In setting HF and presume COPD exacerbation. PNA - Reports history of smoking, but quit a long time ago.   -Treated with  nebulizers, IV steroids.  -Change prednisone to 5mg  home dose, starting 7/13.   CAD, elevation of troponin -Elevation of troponin in the setting of heart failure exacerbation.  Medical management per cardiology   Leukocytosis -Chest x-ray: With patchy opacities in the bases which may be due to additional edema or superimposed pneumonia.  Status post 5 days of antibiotics  in setting of steroids.   B12 deficiency anemia - Continue with B12 supplement.   Essential hypertension - Continue regimen as below   PAD - Continue  with Lipitor and Plavix.   Hyperlipidemia - Continue with Lipitor   GERD - Continue with PPI   Hypothyroidism - Continue with Synthroid   Memory loss, dementia -  Continue with Namenda   Polymyalgia Rheumatica - On Chronic prednisone 5 mg daily. Holding while on IV steroid.    Hypokalemia, Hypomagnesemia - replete and monitor          Consultants: Cardiology  Procedures performed: ECHO  Disposition: Rehabilitation facility Diet recommendation:  Discharge Diet Orders (From admission, onward)     Start     Ordered   10/02/22 0000  Diet - low sodium heart healthy        10/02/22 1000           Cardiac diet DISCHARGE MEDICATION: Allergies as of 10/02/2022       Reactions   Nsaids Other (See Comments)   Due to chronic kidney failure   Butorphanol Other (See Comments)   agitation Constipation Produced a lot of urine, made patient feel crazy   Sulfa Antibiotics Rash   Bisoprolol Fumarate Other (See Comments)   Doesn't remember   Butorphanol Tartrate Other (See Comments)   Produced a lot of urine, made patient feel crazy   Codeine Nausea And Vomiting   Demerol [meperidine] Nausea And Vomiting   Imipramine    Sweating, facial dysfunction    Meperidine And Related Nausea And Vomiting   Statins    MYALGIAS   Tequin [gatifloxacin] Other (See Comments)   Caused hypoglycemia Low blood sugar   Brilinta [ticagrelor] Rash   CAUSES PETECHIAE, PURPURA   Pseudoephedrine Hcl Palpitations   Septra [sulfamethoxazole-trimethoprim] Rash   Sulfamethoxazole-trimethoprim Rash        Medication List     STOP taking these medications    Calcium/Vitamin D3/Adult Gummy 250-100-500 MG-MG-UNIT Chew Generic drug: Calcium-Phosphorus-Vitamin D   isosorbide mononitrate 60 MG 24 hr tablet Commonly known as: IMDUR   losartan 25 MG tablet Commonly known as: COZAAR   methocarbamol 750 MG tablet Commonly known as: ROBAXIN   potassium chloride 10 MEQ CR capsule Commonly known as: MICRO-K   ranolazine 1000 MG SR tablet Commonly known as: RANEXA   Voltaren 1 % Gel Generic drug: diclofenac Sodium       TAKE these medications     acetaminophen 500 MG tablet Commonly known as: TYLENOL Take 1,000 mg by mouth 2 (two) times daily as needed for mild pain.   alum & mag hydroxide-simeth 400-400-40 MG/5ML suspension Commonly known as: MAALOX PLUS Take 10 mLs by mouth as needed for indigestion.   atorvastatin 10 MG tablet Commonly known as: LIPITOR TAKE 1 TABLET ONCE DAILY.   Biofreeze 10 % Liqd Generic drug: Menthol (Topical Analgesic) Apply 1 Application topically as needed (pain).   budesonide 0.25 MG/2ML nebulizer solution Commonly known as: PULMICORT Take 2 mLs (0.25 mg total) by nebulization 2 (two) times daily.   clopidogrel 75 MG tablet Commonly known as: PLAVIX TAKE 1 TABLET ONCE DAILY.   COENZYME Q-10 PO Take 10 mg by mouth at bedtime.   cyanocobalamin 1000 MCG tablet Take 1,000 mcg by mouth daily.   docusate sodium 100 MG capsule Commonly known as: COLACE Take 100 mg by mouth daily as needed for mild constipation.   fluticasone 50 MCG/ACT nasal spray Commonly known as: FLONASE Place 1 spray into both nostrils daily as needed for allergies.   furosemide 20 MG tablet Commonly known as: LASIX Take 20 mg by mouth daily.   ipratropium-albuterol 0.5-2.5 (3) MG/3ML  Soln Commonly known as: DUONEB Take 3 mLs by nebulization 2 (two) times daily.   ketoconazole 2 % shampoo Commonly known as: NIZORAL Apply 1 Application topically 2 (two) times a week. Tuesday and Friday   levocetirizine 5 MG tablet Commonly known as: XYZAL TAKE 1 TABLET ONCE DAILY IN THE EVENING. What changed: See the new instructions.   levothyroxine 75 MCG tablet Commonly known as: SYNTHROID TAKE 1 TABLET ONCE DAILY ON EMPTY STOMACH. What changed: See the new instructions.   melatonin 1 MG Tabs tablet Take 1 mg by mouth at bedtime.   memantine 10 MG tablet Commonly known as: NAMENDA Take 10 mg by mouth at bedtime.   metoCLOPramide 5 MG tablet Commonly known as: REGLAN Take 5 mg by mouth in the morning and at  bedtime.   metoprolol succinate 25 MG 24 hr tablet Commonly known as: TOPROL-XL Take 25 mg by mouth daily. Take with or immediately following a meal.   miconazole 2 % powder Commonly known as: MICOTIN Apply topically 2 (two) times daily.  Apply to groin and pannus topically   Myrbetriq 50 MG Tb24 tablet Generic drug: mirabegron ER TAKE 1 TABLET BY MOUTH DAILY. What changed: how much to take   nitroGLYCERIN 0.4 MG SL tablet Commonly known as: NITROSTAT Place 0.4 mg under the tongue every 5 (five) minutes as needed for chest pain. X 3 doses   pantoprazole 40 MG tablet Commonly known as: PROTONIX Take 1 tablet (40 mg total) by mouth 2 (two) times daily.   predniSONE 5 MG tablet Commonly known as: DELTASONE Take 5 mg by mouth daily.   PSYLLIUM HUSK PO Take 0.4 g by mouth daily. May keep at bedside   venlafaxine 75 MG tablet Commonly known as: EFFEXOR Take 150 mg by mouth daily.   Vitamin D 50 MCG (2000 UT) tablet Take 2,000 Units by mouth daily.   zinc oxide 20 % ointment Apply 1 application topically as needed for irritation.        Follow-up Information     Laurann Montana, PA-C Follow up.   Specialties: Cardiology, Radiology Why: Thursday Oct 08, 2022 Arrive by 8:10 AM Appt at 8:25 AM (25 min)  Additionally you have lab work to complete 1 week post discharge.  Somebody from our team will call to discuss how to complete this. Contact information: [address] [place] [address] [phone]                Discharge Exam: Ceasar Mons Weights   09/30/22 0600 10/01/22 0516 10/02/22 0500  Weight: 83.4 kg 83.6 kg 81.3 kg   General; NAD  Condition at discharge: stable  The results of significant diagnostics from this hospitalization (including imaging, microbiology, ancillary and laboratory) are listed below for reference.   Imaging Studies: US RENAL  Result Date: 09/30/2022 CLINICAL DATA:  [id number] AKI (acute kidney injury) [facility])  [id number] EXAM: RENAL / URINARY TRACT ULTRASOUND COMPLETE COMPARISON:  April 17, 2015 FINDINGS: Right Kidney: Renal measurements: 10.0 x 3.9 x 4.5 cm = volume: 90 mL. Echogenicity within normal limits. No mass or hydronephrosis visualized. Cortical thinning, mild. Left Kidney: Renal measurements: 10.1 x 5.1 x 3.2 cm = volume: 77 mL. Echogenicity within normal limits. No suspicious mass or hydronephrosis visualized. Exophytic cyst measuring up to 19 mm (for which no dedicated imaging follow-up is recommended). Mild cortical thinning. Bladder: Appears normal for degree of bladder distention. Other: None. IMPRESSION: No hydronephrosis. Electronically Signed   By: Meda Klinefelter  M.D.   On: 09/30/2022 20:15   DG CHEST PORT 1 VIEW  Result Date: 09/27/2022 CLINICAL DATA:  Dyspnea. EXAM: PORTABLE CHEST 1 VIEW COMPARISON:  09/21/2022. FINDINGS: Stable changes from prior CABG surgery. No mediastinal or hilar masses. Bilateral interstitial and hazy airspace lung opacities are similar to the prior exam. Suspect small effusions. No pneumothorax. Skeletal structures are grossly intact. IMPRESSION: 1. Findings consistent with CHF with pulmonary edema, similar to the exam from 09/21/2022. Electronically Signed   By: Amie Portland M.D.   On: 09/27/2022 11:43   ECHOCARDIOGRAM COMPLETE  Result Date: 09/22/2022    ECHOCARDIOGRAM REPORT   Patient Name:   TARAJAH MCKERNAN Date of Exam: 09/22/2022 Medical Rec #:  [id number]        Height:       58.0 in Accession #:    [id number]       Weight:       188.5 lb Date of Birth:  August 31, 1940        BSA:          1.776 m Patient Age:    82 years         BP:           128/57 mmHg Patient Gender: F                HR:           52 bpm. Exam Location:  Inpatient Procedure: 2D Echo, Cardiac Doppler, Color Doppler and Intracardiac            Opacification Agent Indications:     CHF - Acute Diastolic  History:         Patient has prior history of Echocardiogram examinations, most                   recent 07/18/2018. CHF, CAD, Prior CABG, PAD, CKD and TIA,                  Signs/Symptoms:elevated troponin, Edema and Dyspnea; Risk                  Factors:Hypertension, Dyslipidemia, Sleep Apnea and Former                  Smoker.  Sonographer:     Wallie Char Referring Phys:  [id number] DAVID MANUEL ORTIZ Diagnosing Phys: Charlton Haws MD IMPRESSIONS  1. Global hypokinesis worse in septum and apex. Left ventricular ejection fraction, by estimation, is 30 to 35%. The left ventricle has moderately decreased function. The left ventricle demonstrates regional wall motion abnormalities (see scoring diagram/findings for description). The left ventricular internal cavity size was moderately dilated. Left ventricular diastolic parameters are indeterminate.  2. Right ventricular systolic function is normal. The right ventricular size is normal.  3. The mitral valve is degenerative. No evidence of mitral valve regurgitation. No evidence of mitral stenosis. Severe mitral annular calcification.  4. The aortic valve is tricuspid. There is moderate calcification of the aortic valve. There is moderate thickening of the aortic valve. Aortic valve regurgitation is not visualized. Mild aortic valve stenosis.  5. The inferior vena cava is normal in size with greater than 50% respiratory variability, suggesting right atrial pressure of 3 mmHg. FINDINGS  Left Ventricle: Global hypokinesis worse in septum and apex. Left ventricular ejection fraction, by estimation, is 30 to 35%. The left ventricle has moderately decreased function. The left ventricle demonstrates regional wall motion abnormalities. Definity contrast agent was given IV to delineate  the left ventricular endocardial borders. The left ventricular internal cavity size was moderately dilated. There is no left ventricular hypertrophy. Left ventricular diastolic parameters are indeterminate. Right Ventricle: The right ventricular size is normal. No increase in right  ventricular wall thickness. Right ventricular systolic function is normal. Left Atrium: Left atrial size was normal in size. Right Atrium: Right atrial size was normal in size. Pericardium: There is no evidence of pericardial effusion. Mitral Valve: The mitral valve is degenerative in appearance. There is mild thickening of the mitral valve leaflet(s). There is mild calcification of the mitral valve leaflet(s). Severe mitral annular calcification. No evidence of mitral valve regurgitation. No evidence of mitral valve stenosis. MV peak gradient, 7.2 mmHg. The mean mitral valve gradient is 4.0 mmHg. Tricuspid Valve: The tricuspid valve is normal in structure. Tricuspid valve regurgitation is mild . No evidence of tricuspid stenosis. Aortic Valve: The aortic valve is tricuspid. There is moderate calcification of the aortic valve. There is moderate thickening of the aortic valve. Aortic valve regurgitation is not visualized. Mild aortic stenosis is present. Aortic valve mean gradient measures 5.0 mmHg. Aortic valve peak gradient measures 8.0 mmHg. Aortic valve area, by VTI measures 1.73 cm. Pulmonic Valve: The pulmonic valve was normal in structure. Pulmonic valve regurgitation is trivial. No evidence of pulmonic stenosis. Aorta: The aortic root is normal in size and structure. Venous: The inferior vena cava is normal in size with greater than 50% respiratory variability, suggesting right atrial pressure of 3 mmHg. IAS/Shunts: No atrial level shunt detected by color flow Doppler.  LEFT VENTRICLE PLAX 2D LVIDd:         4.60 cm      Diastology LVIDs:         3.50 cm      LV e' medial:    4.10 cm/s LV PW:         0.90 cm      LV E/e' medial:  30.5 LV IVS:        0.80 cm      LV e' lateral:   6.19 cm/s LVOT diam:     1.70 cm      LV E/e' lateral: 20.2 LV SV:         48 LV SV Index:   27 LVOT Area:     2.27 cm  LV Volumes (MOD) LV vol d, MOD A2C: 126.0 ml LV vol d, MOD A4C: 126.0 ml LV vol s, MOD A2C: 86.1 ml LV vol s, MOD  A4C: 87.1 ml LV SV MOD A2C:     39.9 ml LV SV MOD A4C:     126.0 ml LV SV MOD BP:      40.1 ml RIGHT VENTRICLE             IVC RV Basal diam:  3.50 cm     IVC diam: 1.90 cm RV S prime:     10.10 cm/s TAPSE (M-mode): 1.0 cm LEFT ATRIUM             Index        RIGHT ATRIUM           Index LA diam:        3.20 cm 1.80 cm/m   RA Area:     16.20 cm LA Vol (A2C):   59.9 ml 33.73 ml/m  RA Volume:   43.40 ml  24.44 ml/m LA Vol (A4C):   63.9 ml 35.98 ml/m LA Biplane Vol: 61.9 ml 34.86 ml/m  AORTIC VALVE AV Area (Vmax):    1.69 cm AV Area (Vmean):   1.56 cm AV Area (VTI):     1.73 cm AV Vmax:           141.50 cm/s AV Vmean:          103.000 cm/s AV VTI:            0.278 m AV Peak Grad:      8.0 mmHg AV Mean Grad:      5.0 mmHg LVOT Vmax:         105.40 cm/s LVOT Vmean:        70.850 cm/s LVOT VTI:          0.212 m LVOT/AV VTI ratio: 0.76  AORTA Ao Root diam: 2.70 cm Ao Asc diam:  3.20 cm MITRAL VALVE                TRICUSPID VALVE MV Area (PHT): 4.68 cm     TR Peak grad:   56.0 mmHg MV Area VTI:   1.65 cm     TR Vmax:        374.00 cm/s MV Peak grad:  7.2 mmHg MV Mean grad:  4.0 mmHg     SHUNTS MV Vmax:       1.34 m/s     Systemic VTI:  0.21 m MV Vmean:      87.3 cm/s    Systemic Diam: 1.70 cm MV Decel Time: 162 msec MV E velocity: 125.00 cm/s MV A velocity: 133.00 cm/s MV E/A ratio:  0.94 Charlton Haws MD Electronically signed by Charlton Haws MD Signature Date/Time: 09/22/2022/11:24:09 AM    Final (Updated)    DG Chest Portable 1 View  Result Date: 09/21/2022 CLINICAL DATA:  Dyspnea and shortness of breath. 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 Thoracic aorta is heavily calcified with stable mediastinal borders. There is increased central vascular engorgement and moderate perihilar interstitial and ground-glass edema. Interstitial edema continues into the bases where there are small pleural effusions forming, as well as patchy opacities  which may represent additional edema or superimposed pneumonia. There are degenerative changes and slight dextroscoliosis thoracic spine. IMPRESSION: 1. CHF or fluid overload with perihilar interstitial and ground-glass edema. 2. Developing small pleural effusions. 3. Patchy opacities in the bases which may be due to additional edema or superimposed pneumonia. Electronically Signed   By: Almira Bar M.D.   On: 09/21/2022 05:53    Microbiology: Results for orders placed or performed during the hospital encounter of 09/21/22  Resp panel by RT-PCR (RSV, Flu A&B, Covid) Anterior Nasal Swab     Status: None   Collection Time: 09/21/22  4:23 AM   Specimen: Anterior Nasal Swab  Result Value Ref Range Status   SARS Coronavirus 2 by RT PCR NEGATIVE NEGATIVE Final    Comment: (NOTE) SARS-CoV-2 target nucleic acids are NOT DETECTED.  The SARS-CoV-2 RNA is generally detectable in upper respiratory specimens during the acute phase of infection. The lowest concentration of SARS-CoV-2 viral copies this assay can detect is 138 copies/mL. A negative result does not preclude SARS-Cov-2 infection and should not be used as the sole basis for treatment or other patient management decisions. A negative result may occur with  improper specimen collection/handling, submission of specimen other than nasopharyngeal swab, presence of viral mutation(s) within the areas targeted by this assay, and inadequate number of viral copies(<138 copies/mL). A  negative result must be combined with clinical observations, patient history, and epidemiological information. The expected result is Negative.  Fact Sheet for Patients:  BloggerCourse.com  Fact Sheet for Healthcare Providers:  SeriousBroker.it  This test is no t yet approved or cleared by the [place] FDA and  has been authorized for detection and/or diagnosis of SARS-CoV-2 by FDA under an Emergency Use  Authorization (EUA). This EUA will remain  in effect (meaning this test can be used) for the duration of the COVID-19 declaration under Section 564(b)(1) of the Act, 21 U.S.C.section 360bbb-3(b)(1), unless the authorization is terminated  or revoked sooner.       Influenza A by PCR NEGATIVE NEGATIVE Final   Influenza B by PCR NEGATIVE NEGATIVE Final    Comment: (NOTE) The Xpert Xpress SARS-CoV-2/FLU/RSV plus assay is intended as an aid in the diagnosis of influenza from Nasopharyngeal swab specimens and should not be used as a sole basis for treatment. Nasal washings and aspirates are unacceptable for Xpert Xpress SARS-CoV-2/FLU/RSV testing.  Fact Sheet for Patients: BloggerCourse.com  Fact Sheet for Healthcare Providers: SeriousBroker.it  This test is not yet approved or cleared by the [place] FDA and has been authorized for detection and/or diagnosis of SARS-CoV-2 by FDA under an Emergency Use Authorization (EUA). This EUA will remain in effect (meaning this test can be used) for the duration of the COVID-19 declaration under Section 564(b)(1) of the Act, 21 U.S.C. section 360bbb-3(b)(1), unless the authorization is terminated or revoked.     Resp Syncytial Virus by PCR NEGATIVE NEGATIVE Final    Comment: (NOTE) Fact Sheet for Patients: BloggerCourse.com  Fact Sheet for Healthcare Providers: SeriousBroker.it  This test is not yet approved or cleared by the [place] FDA and has been authorized for detection and/or diagnosis of SARS-CoV-2 by FDA under an Emergency Use Authorization (EUA). This EUA will remain in effect (meaning this test can be used) for the duration of the COVID-19 declaration under Section 564(b)(1) of the Act, 21 U.S.C. section 360bbb-3(b)(1), unless the authorization is terminated or revoked.  Performed at [facility],  [address] [place], [place] [address]   MRSA Next Gen by PCR, Nasal     Status: None   Collection Time: 09/21/22 12:24 PM   Specimen: Nasal Mucosa; Nasal Swab  Result Value Ref Range Status   MRSA by PCR Next Gen NOT DETECTED NOT DETECTED Final    Comment: (NOTE) The GeneXpert MRSA Assay (FDA approved for NASAL specimens only), is one component of a comprehensive MRSA colonization surveillance program. It is not intended to diagnose MRSA infection nor to guide or monitor treatment for MRSA infections. Test performance is not FDA approved in patients less than 33 years old. Performed at [facility], [address] [place], [place] [address]     Labs: CBC: Recent Labs  Lab 09/26/22 [phone] 09/30/22 0428 10/01/22 0854  WBC 15.9* 20.0* 15.9*  HGB 9.5* 10.3* 9.9*  HCT 30.5* 33.9* 31.7*  MCV 104.5* 104.3* 101.6*  PLT 319 363 338   Basic Metabolic Panel: Recent Labs  Lab 09/26/22 0338 09/27/22 0343 09/28/22 0659 09/29/22 0746 09/30/22 0428 10/01/22 0402 10/02/22 0338  NA 138 139 134* 135 132* 131* 135  K 3.9 3.7 3.9 3.7 4.2 3.9 4.0  CL 105 102 97* 96* 95* 97* 98  CO2 24 25 26 27 25 24 28   GLUCOSE 159* 126* 130* 107* 108* 90 88  BUN 32* 38* 45* 60* 70* 58* 53*  CREATININE 1.13* 1.23* 1.31* 1.56* 2.14* 1.53* 1.50*  CALCIUM 9.0 9.3 9.7 9.6 9.4 8.8* 9.1  MG 1.9 2.0 1.6* 2.2  --   --   --    Liver Function Tests: No results for input(s): "AST", "ALT", "ALKPHOS", "BILITOT", "PROT", "ALBUMIN" in the last 168 hours. CBG: Recent Labs  Lab 09/29/22 1453  GLUCAP 148*    Discharge time spent: greater than 30 minutes.  Signed: Alba Cory, MD [facility] 10/02/2022

## 2022-10-02 NOTE — TOC Transition Note (Signed)
Transition of Care [facility]) - CM/SW Discharge Note   Patient Details  Name: Erin Ingram MRN: [id number] Date of Birth: 12-Mar-1941  Transition of Care [facility]) CM/SW Contact:  Larrie Kass, LCSW Phone Number: 10/02/2022, 10:39 AM   Clinical Narrative:    Pt's insurance Berkley Harvey was approved. [facility] call report [phone] ex [phone]. PTAR called, no further TOC needs, TOC sign off.    Final next level of care: Skilled Nursing Facility Barriers to Discharge: No Barriers Identified   Patient Goals and CMS Choice   Choice offered to / list presented to : NA  Discharge Placement                      Patient and family notified of of transfer: 10/02/22  Discharge Plan and Services Additional resources added to the After Visit Summary for   In-house Referral: NA Discharge Planning Services: NA                                 Social Determinants of Health (SDOH) Interventions SDOH Screenings   Food Insecurity: No Food Insecurity (09/21/2022)  Housing: Low Risk  (09/21/2022)  Transportation Needs: No Transportation Needs (09/21/2022)  Utilities: Not At Risk (09/21/2022)  Financial Resource Strain: Low Risk  (12/07/2017)  Physical Activity: Inactive (12/07/2017)  Social Connections: Somewhat Isolated (12/07/2017)  Stress: Stress Concern Present (12/07/2017)  Tobacco Use: Medium Risk (09/21/2022)     Readmission Risk Interventions    09/22/2022    1:02 PM  Readmission Risk Prevention Plan  Transportation Screening Complete  HRI or Home Care Consult Complete  Social Work Consult for Recovery Care Planning/Counseling Complete  Palliative Care Screening Not Applicable

## 2022-10-02 NOTE — Care Management Important Message (Signed)
Important Message  Patient Details IM Letter given. Name: NIKKIE THORSON MRN: [id number] Date of Birth: Aug 12, 1940   Medicare Important Message Given:  Yes     Maeley, Suess 10/02/2022, 9:51 AM

## 2022-10-05 ENCOUNTER — Non-Acute Institutional Stay (SKILLED_NURSING_FACILITY): Payer: Medicare Other | Admitting: Orthopedic Surgery

## 2022-10-05 ENCOUNTER — Encounter: Payer: Self-pay | Admitting: Orthopedic Surgery

## 2022-10-05 DIAGNOSIS — I5033 Acute on chronic diastolic (congestive) heart failure: Secondary | ICD-10-CM | POA: Diagnosis not present

## 2022-10-05 DIAGNOSIS — N179 Acute kidney failure, unspecified: Secondary | ICD-10-CM

## 2022-10-05 DIAGNOSIS — M353 Polymyalgia rheumatica: Secondary | ICD-10-CM

## 2022-10-05 DIAGNOSIS — K219 Gastro-esophageal reflux disease without esophagitis: Secondary | ICD-10-CM

## 2022-10-05 DIAGNOSIS — E039 Hypothyroidism, unspecified: Secondary | ICD-10-CM

## 2022-10-05 DIAGNOSIS — I10 Essential (primary) hypertension: Secondary | ICD-10-CM

## 2022-10-05 DIAGNOSIS — J449 Chronic obstructive pulmonary disease, unspecified: Secondary | ICD-10-CM | POA: Diagnosis not present

## 2022-10-05 DIAGNOSIS — R413 Other amnesia: Secondary | ICD-10-CM

## 2022-10-05 DIAGNOSIS — I739 Peripheral vascular disease, unspecified: Secondary | ICD-10-CM

## 2022-10-05 DIAGNOSIS — I2510 Atherosclerotic heart disease of native coronary artery without angina pectoris: Secondary | ICD-10-CM

## 2022-10-05 MED ORDER — FUROSEMIDE 40 MG PO TABS
40.0000 mg | ORAL_TABLET | Freq: Two times a day (BID) | ORAL | Status: DC
Start: 2022-10-05 — End: 2022-10-07

## 2022-10-05 NOTE — Progress Notes (Signed)
[facility] Room Number: 4-B Place of Service:  SNF (31) Provider: Hazle Nordmann, NP  Code Status: DNR Goals of Care:     10/05/2022   11:18 AM  Advanced Directives  Does Patient Have a Medical Advance Directive? Yes  Type of Estate agent of [place];Living will;Out of facility DNR (pink MOST or yellow form)  Does patient want to make changes to medical advance directive? No - Patient declined  Copy of Healthcare Power of Attorney in Chart? Yes - validated most recent copy scanned in chart (See row information)     Chief Complaint  Patient presents with   Hospitalization Follow-up    Hospital follow up.     HPI: Patient is a 82 y.o. female seen today for hospital follow-up s/p admission 07/01- 07/12.   She presented to ED with increased shortness of breath and BLE edema. CXR noted fluid overload,perihilar interstitial and ground-glass edema. BNP 1700, troponin was also elevated. 2D echo showed worsening LVEF 30-35%. Cardiology consulted. She was started on Entresto, Farxiga, spirolactone and furosemide. Diuresed 10L. She did develop AKI while hospitalized. Creatine was 2.4, but eventually dropped to 1.5. Renal ultrasound negative for hydronephrosis. She Rene Kocher discharged to [facility] 07/12. Cardiology f/u 07/18.   Today, O2 sats have dropped to 88-87% on RA. She was started on 2 liters oxygen and given morning duoneb. Sats now 91%. She confirms shortness of breath. Rales on exam. No edema. Today's weight 178.5 lbs. Discharge weight 179 lbs 10/02/2022.     Past Medical History:  Diagnosis Date   Anemia    Arthritis    "fingers, back, shoulders, hips" (04/10/2016)   Chronic diastolic CHF (congestive heart failure) (HCC)    a. normal EF, LVEDP at [facility] in 6/17 33 >> Lasix started    CKD (chronic kidney disease), stage III (HCC)    Coronary artery disease    a. s/p CABG 2000 (SVG/ free LIMA Y graft to the diagonal and  distal LAD, SVG to the OM 1, and SVG to the PDA) . b. Cath 12/19/2014 90% dSVG to RCA s/p 2 overlapping DES. c. LHC 2016, 2017 no intervention except diuresis needed. d. Low risk nuc 03/2016. e. Cath 12/2017 patent LIMA-LAD, SVG-OM, SVG-PDA but occluded SVG to diag.    Depression    with anxious component   GERD (gastroesophageal reflux disease)    History of hiatal hernia    Hyperlipidemia    Hypothyroidism    Obesity    OSA on CPAP    PMR (polymyalgia rheumatica) (HCC)    Pneumonia    "2-3 times" (04/10/2016)   Stable angina    microvascular, improved with Ranexa   Subclavian artery stenosis (HCC)    a. L by cath note in 2016.   TIA (transient ischemic attack)     Past Surgical History:  Procedure Laterality Date   BREAST BIOPSY Right 1964   CARDIAC CATHETERIZATION  08   patent grafts, no culprit lesions, EF 65%   CARDIAC CATHETERIZATION N/A 12/19/2014   Procedure: Left Heart Cath and Cors/Grafts Angiography;  Surgeon: Corky Crafts, MD;  Location: [facility] INVASIVE CV LAB;  Service: Cardiovascular;  Laterality: N/A;   CARDIAC CATHETERIZATION N/A 12/19/2014   Procedure: Coronary Stent Intervention;  Surgeon: Corky Crafts, MD;  Location: [facility] INVASIVE CV LAB;  Service: Cardiovascular;  Laterality: N/A;   CARDIAC CATHETERIZATION N/A 01/01/2015   Procedure: Left Heart Cath and Cors/Grafts Angiography;  Surgeon: Renelda Loma  Hoyle Barr, MD;  Location: [facility] INVASIVE CV LAB;  Service: Cardiovascular;  Laterality: N/A;   CARDIAC CATHETERIZATION N/A 09/20/2015   Procedure: Left Heart Cath and Cors/Grafts Angiography;  Surgeon: Corky Crafts, MD;  Location: [facility] INVASIVE CV LAB;  Service: Cardiovascular;  Laterality: N/A;   CATARACT EXTRACTION W/ INTRAOCULAR LENS  IMPLANT, BILATERAL Bilateral 2011   COLONOSCOPY WITH PROPOFOL N/A 07/27/2016   Procedure: COLONOSCOPY WITH PROPOFOL;  Surgeon: Charlott Rakes, MD;  Location: [facility] ENDOSCOPY;  Service: Endoscopy;  Laterality: N/A;   CORONARY ARTERY  BYPASS GRAFT  2000   ASCVD, multivessel, S./P.   DILATION AND CURETTAGE OF UTERUS  1980s   ESOPHAGOGASTRODUODENOSCOPY (EGD) WITH PROPOFOL N/A 07/27/2016   Procedure: ESOPHAGOGASTRODUODENOSCOPY (EGD) WITH PROPOFOL;  Surgeon: Charlott Rakes, MD;  Location: [facility] ENDOSCOPY;  Service: Endoscopy;  Laterality: N/A;   FRACTURE SURGERY     LEFT HEART CATH AND CORS/GRAFTS ANGIOGRAPHY N/A 01/12/2018   Procedure: LEFT HEART CATH AND CORS/GRAFTS ANGIOGRAPHY;  Surgeon: Corky Crafts, MD;  Location: [facility] INVASIVE CV LAB;  Service: Cardiovascular;  Laterality: N/A;   PATELLA FRACTURE SURGERY Left 1993    Allergies  Allergen Reactions   Nsaids Other (See Comments)    Due to chronic kidney failure   Butorphanol Other (See Comments)    agitation Constipation Produced a lot of urine, made patient feel crazy   Sulfa Antibiotics Rash   Bisoprolol Fumarate Other (See Comments)    Doesn't remember   Butorphanol Tartrate Other (See Comments)    Produced a lot of urine, made patient feel crazy   Codeine Nausea And Vomiting   Demerol [Meperidine] Nausea And Vomiting   Imipramine     Sweating, facial dysfunction    Meperidine And Related Nausea And Vomiting   Statins     MYALGIAS   Tequin [Gatifloxacin] Other (See Comments)    Caused hypoglycemia Low blood sugar   Brilinta [Ticagrelor] Rash    CAUSES PETECHIAE, PURPURA   Pseudoephedrine Hcl Palpitations   Septra [Sulfamethoxazole-Trimethoprim] Rash   Sulfamethoxazole-Trimethoprim Rash    Outpatient Encounter Medications as of 10/05/2022  Medication Sig   acetaminophen (TYLENOL) 500 MG tablet Take 1,000 mg by mouth 2 (two) times daily as needed for mild pain.   alum & mag hydroxide-simeth (MAALOX PLUS) 400-400-40 MG/5ML suspension Take 10 mLs by mouth as needed for indigestion.   atorvastatin (LIPITOR) 10 MG tablet TAKE 1 TABLET ONCE DAILY.   budesonide (PULMICORT) 0.25 MG/2ML nebulizer solution Take 2 mLs (0.25 mg total) by nebulization 2 (two)  times daily.   Cholecalciferol (VITAMIN D) 50 MCG (2000 UT) tablet Take 2,000 Units by mouth daily.   clopidogrel (PLAVIX) 75 MG tablet TAKE 1 TABLET ONCE DAILY.   COENZYME Q-10 PO Take 10 mg by mouth at bedtime.   cyanocobalamin 1000 MCG tablet Take 1,000 mcg by mouth daily.   docusate sodium (COLACE) 100 MG capsule Take 100 mg by mouth daily as needed for mild constipation.   fluticasone (FLONASE) 50 MCG/ACT nasal spray Place 1 spray into both nostrils daily as needed for allergies.   furosemide (LASIX) 20 MG tablet Take 20 mg by mouth daily.   ipratropium-albuterol (DUONEB) 0.5-2.5 (3) MG/3ML SOLN Take 3 mLs by nebulization 2 (two) times daily.   ketoconazole (NIZORAL) 2 % shampoo Apply 1 Application topically 2 (two) times a week. Tuesday and Friday   levocetirizine (XYZAL) 5 MG tablet TAKE 1 TABLET ONCE DAILY IN THE EVENING.   levothyroxine (SYNTHROID) 75 MCG tablet TAKE 1 TABLET ONCE DAILY  ON EMPTY STOMACH.   melatonin 1 MG TABS tablet Take 1 mg by mouth at bedtime.   memantine (NAMENDA) 10 MG tablet Take 10 mg by mouth at bedtime.   Menthol, Topical Analgesic, (BIOFREEZE) 10 % LIQD Apply 1 Application topically as needed (pain).   metoCLOPramide (REGLAN) 5 MG tablet Take 5 mg by mouth in the morning and at bedtime.   metoprolol succinate (TOPROL-XL) 25 MG 24 hr tablet Take 25 mg by mouth daily. Take with or immediately following a meal.   miconazole (MICOTIN) 2 % powder Apply topically at bedtime.  Apply to groin and pannus topically   MYRBETRIQ 50 MG TB24 tablet TAKE 1 TABLET BY MOUTH DAILY.   nitroGLYCERIN (NITROSTAT) 0.4 MG SL tablet Place 0.4 mg under the tongue every 5 (five) minutes as needed for chest pain. X 3 doses   pantoprazole (PROTONIX) 40 MG tablet Take 1 tablet (40 mg total) by mouth 2 (two) times daily.   predniSONE (DELTASONE) 5 MG tablet Take 5 mg by mouth daily.   PSYLLIUM HUSK PO Take 0.4 g by mouth daily. May keep at bedside   venlafaxine (EFFEXOR) 75 MG tablet Take  150 mg by mouth daily.   zinc oxide 20 % ointment Apply 1 application topically as needed for irritation.   No facility-administered encounter medications on file as of 10/05/2022.    Review of Systems:  Review of Systems  Constitutional:  Negative for activity change and appetite change.  HENT:  Negative for sore throat and trouble swallowing.   Eyes:  Negative for visual disturbance.  Respiratory:  Positive for cough and shortness of breath. Negative for wheezing.   Cardiovascular:  Negative for chest pain and leg swelling.  Gastrointestinal:  Negative for abdominal distention and abdominal pain.  Genitourinary:  Positive for frequency. Negative for dysuria and hematuria.  Musculoskeletal:  Positive for arthralgias and gait problem.  Skin:  Negative for wound.  Neurological:  Positive for weakness. Negative for dizziness and light-headedness.  Psychiatric/Behavioral:  Positive for confusion. Negative for dysphoric mood and sleep disturbance. The patient is not nervous/anxious.     Health Maintenance  Topic Date Due   DTaP/Tdap/Td (3 - Td or Tdap) 04/20/2022   COVID-19 Vaccine (7 - 2023-24 season) 10/22/2022 (Originally 03/19/2022)   INFLUENZA VACCINE  10/22/2022   Pneumonia Vaccine 66+ Years old  Completed   DEXA SCAN  Completed   Zoster Vaccines- Shingrix  Completed   HPV VACCINES  Aged Out    Physical Exam: Vitals:   10/05/22 1104  BP: (!) 140/80  Pulse: 90  Resp: 20  Temp: (!) 97.4 F (36.3 C)  SpO2: 93%  Height: 4\' 10"  (1.473 m)   Body mass index is 37.45 kg/m. Physical Exam Vitals reviewed.  Constitutional:      Appearance: She is obese.  HENT:     Head: Normocephalic.  Eyes:     General:        Right eye: No discharge.        Left eye: No discharge.  Cardiovascular:     Rate and Rhythm: Normal rate. Rhythm irregular.     Pulses: Normal pulses.     Heart sounds: Normal heart sounds.  Pulmonary:     Effort: Pulmonary effort is normal. No respiratory  distress.     Breath sounds: Examination of the right-upper field reveals rales. Examination of the left-upper field reveals rales. Examination of the right-middle field reveals rales. Examination of the left-middle field reveals rales. Rales present.  No wheezing.  Abdominal:     General: Bowel sounds are normal. There is no distension.     Palpations: Abdomen is soft.     Tenderness: There is no abdominal tenderness.  Musculoskeletal:     Cervical back: Neck supple.     Right lower leg: Edema present.     Left lower leg: Edema present.     Comments: Non pitting  Skin:    General: Skin is warm.     Capillary Refill: Capillary refill takes less than 2 seconds.  Neurological:     General: No focal deficit present.     Mental Status: She is alert and oriented to person, place, and time.     Motor: Weakness present.     Gait: Gait abnormal.  Psychiatric:        Mood and Affect: Mood normal.     Labs reviewed: Basic Metabolic Panel: Recent Labs    09/27/22 0343 09/28/22 0659 09/29/22 0746 09/30/22 0428 10/01/22 0402 10/02/22 0338  NA 139 134* 135 132* 131* 135  K 3.7 3.9 3.7 4.2 3.9 4.0  CL 102 97* 96* 95* 97* 98  CO2 25 26 27 25 24 28   GLUCOSE 126* 130* 107* 108* 90 88  BUN 38* 45* 60* 70* 58* 53*  CREATININE 1.23* 1.31* 1.56* 2.14* 1.53* 1.50*  CALCIUM 9.3 9.7 9.6 9.4 8.8* 9.1  MG 2.0 1.6* 2.2  --   --   --    Liver Function Tests: Recent Labs    09/21/22 0423 09/24/22 0305 09/25/22 0339  AST 15 15 15   ALT 13 12 12   ALKPHOS 37* 42 43  BILITOT 0.5 0.5 0.4  PROT 6.1* 5.8* 5.9*  ALBUMIN 3.0* 2.7* 2.7*   No results for input(s): "LIPASE", "AMYLASE" in the last 8760 hours. No results for input(s): "AMMONIA" in the last 8760 hours. CBC: Recent Labs    12/05/21 0000 09/21/22 0423 09/22/22 0744 09/26/22 0338 09/30/22 0428 10/01/22 0854  WBC 12.2 16.0*   < > 15.9* 20.0* 15.9*  NEUTROABS 8,747.00 12.9*  --   --   --   --   HGB 10.3* 9.0*   < > 9.5* 10.3* 9.9*   HCT 32* 29.1*   < > 30.5* 33.9* 31.7*  MCV  --  105.1*   < > 104.5* 104.3* 101.6*  PLT 268 310   < > 319 363 338   < > = values in this interval not displayed.   Lipid Panel: No results for input(s): "CHOL", "HDL", "LDLCALC", "TRIG", "CHOLHDL", "LDLDIRECT" in the last 8760 hours. Lab Results  Component Value Date   HGBA1C 5.1 02/14/2019    Procedures since last visit: ECHOCARDIOGRAM COMPLETE  Result Date: 09/22/2022    ECHOCARDIOGRAM REPORT   Patient Name:   Erin Ingram Date of Exam: 09/22/2022 Medical Rec #:  [id number]        Height:       58.0 in Accession #:    [id number]       Weight:       188.5 lb Date of Birth:  Sep 17, 1940        BSA:          1.776 m Patient Age:    82 years         BP:           128/57 mmHg Patient Gender: F                HR:  52 bpm. Exam Location:  Inpatient Procedure: 2D Echo, Cardiac Doppler, Color Doppler and Intracardiac            Opacification Agent Indications:     CHF - Acute Diastolic  History:         Patient has prior history of Echocardiogram examinations, most                  recent 07/18/2018. CHF, CAD, Prior CABG, PAD, CKD and TIA,                  Signs/Symptoms:elevated troponin, Edema and Dyspnea; Risk                  Factors:Hypertension, Dyslipidemia, Sleep Apnea and Former                  Smoker.  Sonographer:     Wallie Char Referring Phys:  [id number] DAVID MANUEL ORTIZ Diagnosing Phys: Charlton Haws MD IMPRESSIONS  1. Global hypokinesis worse in septum and apex. Left ventricular ejection fraction, by estimation, is 30 to 35%. The left ventricle has moderately decreased function. The left ventricle demonstrates regional wall motion abnormalities (see scoring diagram/findings for description). The left ventricular internal cavity size was moderately dilated. Left ventricular diastolic parameters are indeterminate.  2. Right ventricular systolic function is normal. The right ventricular size is normal.  3. The mitral valve is  degenerative. No evidence of mitral valve regurgitation. No evidence of mitral stenosis. Severe mitral annular calcification.  4. The aortic valve is tricuspid. There is moderate calcification of the aortic valve. There is moderate thickening of the aortic valve. Aortic valve regurgitation is not visualized. Mild aortic valve stenosis.  5. The inferior vena cava is normal in size with greater than 50% respiratory variability, suggesting right atrial pressure of 3 mmHg. FINDINGS  Left Ventricle: Global hypokinesis worse in septum and apex. Left ventricular ejection fraction, by estimation, is 30 to 35%. The left ventricle has moderately decreased function. The left ventricle demonstrates regional wall motion abnormalities. Definity contrast agent was given IV to delineate the left ventricular endocardial borders. The left ventricular internal cavity size was moderately dilated. There is no left ventricular hypertrophy. Left ventricular diastolic parameters are indeterminate. Right Ventricle: The right ventricular size is normal. No increase in right ventricular wall thickness. Right ventricular systolic function is normal. Left Atrium: Left atrial size was normal in size. Right Atrium: Right atrial size was normal in size. Pericardium: There is no evidence of pericardial effusion. Mitral Valve: The mitral valve is degenerative in appearance. There is mild thickening of the mitral valve leaflet(s). There is mild calcification of the mitral valve leaflet(s). Severe mitral annular calcification. No evidence of mitral valve regurgitation. No evidence of mitral valve stenosis. MV peak gradient, 7.2 mmHg. The mean mitral valve gradient is 4.0 mmHg. Tricuspid Valve: The tricuspid valve is normal in structure. Tricuspid valve regurgitation is mild . No evidence of tricuspid stenosis. Aortic Valve: The aortic valve is tricuspid. There is moderate calcification of the aortic valve. There is moderate thickening of the aortic  valve. Aortic valve regurgitation is not visualized. Mild aortic stenosis is present. Aortic valve mean gradient measures 5.0 mmHg. Aortic valve peak gradient measures 8.0 mmHg. Aortic valve area, by VTI measures 1.73 cm. Pulmonic Valve: The pulmonic valve was normal in structure. Pulmonic valve regurgitation is trivial. No evidence of pulmonic stenosis. Aorta: The aortic root is normal in size and structure. Venous: The inferior vena cava is normal  in size with greater than 50% respiratory variability, suggesting right atrial pressure of 3 mmHg. IAS/Shunts: No atrial level shunt detected by color flow Doppler.  LEFT VENTRICLE PLAX 2D LVIDd:         4.60 cm      Diastology LVIDs:         3.50 cm      LV e' medial:    4.10 cm/s LV PW:         0.90 cm      LV E/e' medial:  30.5 LV IVS:        0.80 cm      LV e' lateral:   6.19 cm/s LVOT diam:     1.70 cm      LV E/e' lateral: 20.2 LV SV:         48 LV SV Index:   27 LVOT Area:     2.27 cm  LV Volumes (MOD) LV vol d, MOD A2C: 126.0 ml LV vol d, MOD A4C: 126.0 ml LV vol s, MOD A2C: 86.1 ml LV vol s, MOD A4C: 87.1 ml LV SV MOD A2C:     39.9 ml LV SV MOD A4C:     126.0 ml LV SV MOD BP:      40.1 ml RIGHT VENTRICLE             IVC RV Basal diam:  3.50 cm     IVC diam: 1.90 cm RV S prime:     10.10 cm/s TAPSE (M-mode): 1.0 cm LEFT ATRIUM             Index        RIGHT ATRIUM           Index LA diam:        3.20 cm 1.80 cm/m   RA Area:     16.20 cm LA Vol (A2C):   59.9 ml 33.73 ml/m  RA Volume:   43.40 ml  24.44 ml/m LA Vol (A4C):   63.9 ml 35.98 ml/m LA Biplane Vol: 61.9 ml 34.86 ml/m  AORTIC VALVE AV Area (Vmax):    1.69 cm AV Area (Vmean):   1.56 cm AV Area (VTI):     1.73 cm AV Vmax:           141.50 cm/s AV Vmean:          103.000 cm/s AV VTI:            0.278 m AV Peak Grad:      8.0 mmHg AV Mean Grad:      5.0 mmHg LVOT Vmax:         105.40 cm/s LVOT Vmean:        70.850 cm/s LVOT VTI:          0.212 m LVOT/AV VTI ratio: 0.76  AORTA Ao Root diam: 2.70 cm Ao  Asc diam:  3.20 cm MITRAL VALVE                TRICUSPID VALVE MV Area (PHT): 4.68 cm     TR Peak grad:   56.0 mmHg MV Area VTI:   1.65 cm     TR Vmax:        374.00 cm/s MV Peak grad:  7.2 mmHg MV Mean grad:  4.0 mmHg     SHUNTS MV Vmax:       1.34 m/s     Systemic VTI:  0.21 m MV Vmean:      87.3  cm/s    Systemic Diam: 1.70 cm MV Decel Time: 162 msec MV E velocity: 125.00 cm/s MV A velocity: 133.00 cm/s MV E/A ratio:  0.94 Charlton Haws MD Electronically signed by Charlton Haws MD Signature Date/Time: 09/22/2022/11:24:09 AM    Final (Updated)    DG Chest Portable 1 View  Result Date: 09/21/2022 CLINICAL DATA:  Dyspnea and shortness of breath. 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 Thoracic aorta is heavily calcified with stable mediastinal borders. There is increased central vascular engorgement and moderate perihilar interstitial and ground-glass edema. Interstitial edema continues into the bases where there are small pleural effusions forming, as well as patchy opacities which may represent additional edema or superimposed pneumonia. There are degenerative changes and slight dextroscoliosis thoracic spine. IMPRESSION: 1. CHF or fluid overload with perihilar interstitial and ground-glass edema. 2. Developing small pleural effusions. 3. Patchy opacities in the bases which may be due to additional edema or superimposed pneumonia. Electronically Signed   By: Almira Bar M.D.   On: 09/21/2022 05:53    Assessment/Plan 1. Acute on chronic diastolic heart failure (HCC) - hospitalized 07/01-07/12 - CXR noted fluid, BNP > 1700 - 10 liters diuresed - discharged with furosemide - decreased sats 87-88%, increased sob today - rales noted on exam - start furosemide 40 mg po BID x 2 days - increase furosemide to 40 mg po daily> start 07/17 - start daily weights  2. Acute renal failure, unspecified acute renal failure type (HCC) -  related to aggressive diuresis  - renal ultrasound negative for hydronephrosis - BUN/creat 53/1.5 - recheck bmp 07/16 - see above  3. Chronic obstructive pulmonary disease, unspecified COPD type (HCC) - cont Budesonide and duonebs  4. Essential hypertension - controlled - cont metoprolol  5. Coronary artery disease involving native coronary artery of native heart without angina pectoris - cont Plavix and atorvastatin  6. PAD (peripheral artery disease) (HCC) - see above  7. Gastroesophageal reflux disease without esophagitis - cont Protonix  8. Acquired hypothyroidism - cont levothyroxine  9. Memory loss - cont Namenda  10. Polymyalgia rheumatica (HCC) - cont prednisone 5 mg daily    Labs/tests ordered:  bmp & BNP 07/16

## 2022-10-06 ENCOUNTER — Other Ambulatory Visit: Payer: Self-pay | Admitting: Nurse Practitioner

## 2022-10-06 ENCOUNTER — Non-Acute Institutional Stay (SKILLED_NURSING_FACILITY): Payer: Medicare Other | Admitting: Family Medicine

## 2022-10-06 DIAGNOSIS — E039 Hypothyroidism, unspecified: Secondary | ICD-10-CM

## 2022-10-06 DIAGNOSIS — I10 Essential (primary) hypertension: Secondary | ICD-10-CM

## 2022-10-06 DIAGNOSIS — I5020 Unspecified systolic (congestive) heart failure: Secondary | ICD-10-CM

## 2022-10-06 DIAGNOSIS — R2681 Unsteadiness on feet: Secondary | ICD-10-CM

## 2022-10-06 DIAGNOSIS — R413 Other amnesia: Secondary | ICD-10-CM | POA: Diagnosis not present

## 2022-10-06 DIAGNOSIS — Z79899 Other long term (current) drug therapy: Secondary | ICD-10-CM

## 2022-10-06 DIAGNOSIS — I5032 Chronic diastolic (congestive) heart failure: Secondary | ICD-10-CM

## 2022-10-06 DIAGNOSIS — J810 Acute pulmonary edema: Secondary | ICD-10-CM | POA: Diagnosis not present

## 2022-10-06 LAB — BASIC METABOLIC PANEL
BUN: 24 — AB (ref 4–21)
CO2: 28 — AB (ref 13–22)
Chloride: 98 — AB (ref 99–108)
Creatinine: 1.5 — AB (ref 0.5–1.1)
Glucose: 86
Potassium: 3.3 mEq/L — AB (ref 3.5–5.1)
Sodium: 138 (ref 137–147)

## 2022-10-06 LAB — COMPREHENSIVE METABOLIC PANEL
Calcium: 7.7 — AB (ref 8.7–10.7)
eGFR: 34

## 2022-10-06 NOTE — Telephone Encounter (Signed)
Patient contacted regarding discharge from [facility] on 10/02/2022.  Patient understands to follow up with provider Lucile Crater, PA on 10/08/22 at 08:25 at [facility]. Patient understands discharge instructions? Yes Patient understands medications and regiment? Yes Patient understands to bring all medications to this visit? Yes

## 2022-10-06 NOTE — Progress Notes (Signed)
Provider:  Jacalyn Lefevre, MD Location:      Place of Service:     PCP: Mast, Man X, NP Patient Care Team: Mast, Man X, NP as PCP - General (Internal Medicine) Erin Crafts, MD as PCP - Cardiology (Cardiology) Erin Rakes, MD as Consulting Physician (Gastroenterology) Erin Ingram, Erin Mckusick, MD as Consulting Physician (Orthopedic Surgery) Erin Feinstein, MD as Consulting Physician (Neurology) Erin Matar, MD as Consulting Physician (Urology) Erin Citrin, MD as Consulting Physician (Neurosurgery)  Extended Emergency Contact Information Primary Emergency Contact: [facility] Phone: [phone] Relation: Son Secondary Emergency Contact: Ransford,MATTHEW Mobile Phone: [phone] Relation: Son  Code Status:  Goals of Care: Advanced Directive information    10/05/2022   11:18 AM  Advanced Directives  Does Patient Have a Medical Advance Directive? Yes  Type of Estate agent of [place];Living will;Out of facility DNR (pink MOST or yellow form)  Does patient want to make changes to medical advance directive? No - Patient declined  Copy of Healthcare Power of Attorney in Chart? Yes - validated most recent copy scanned in chart (See row information)      No chief complaint on file.   HPI: Patient is a 82 y.o. female seen today for admission to [facility] following a 11-day hospitalization for acute on chronic diastolic heart failure.  She is a resident of assisted living and prior to her admission had worsening shortness of breath lower extremity edema cough and hypoxia.  To the hospital and initially started on BiPAP.  About 1 week into her hospitalization she developed acute kidney injury and Lasix was held as well as Entresto spironolactone and [place].  As renal function improved these were added back.  Creatinine had increased to 2.4 and then decreased to 1.520 mg Lasix was continued at discharge.  She will follow-up with  cardiology on 718. She was seen yesterday here for increased shortness of breath.  Sats had dropped to 87% on room air.  She was started on 2 L of oxygen given her neb treatments and additional doses of Lasix were ordered.  Today she reports to feeling subjectively improved.  There is no dependent edema today but she does appear dyspneic.  Past Medical History:  Diagnosis Date   Anemia    Arthritis    "fingers, back, shoulders, hips" (04/10/2016)   Chronic diastolic CHF (congestive heart failure) (HCC)    a. normal EF, LVEDP [facility] in 6/17 33 >> Lasix started    CKD (chronic kidney disease), stage III (HCC)    Coronary artery disease    a. s/p CABG 2000 (SVG/ free LIMA Y graft to the diagonal and distal LAD, SVG to the OM 1, and SVG to the PDA) . b. Cath 12/19/2014 90% dSVG to RCA s/p 2 overlapping DES. c. LHC 2016, 2017 no intervention except diuresis needed. d. Low risk nuc 03/2016. e. Cath 12/2017 patent LIMA-LAD, SVG-OM, SVG-PDA but occluded SVG to diag.    Depression    with anxious component   GERD (gastroesophageal reflux disease)    History of hiatal hernia    Hyperlipidemia    Hypothyroidism    Obesity    OSA on CPAP    PMR (polymyalgia rheumatica) (HCC)    Pneumonia    "2-3 times" (04/10/2016)   Stable angina    microvascular, improved with Ranexa   Subclavian artery stenosis (HCC)    a. L by cath note in 2016.   TIA (transient ischemic attack)    Past  Surgical History:  Procedure Laterality Date   BREAST BIOPSY Right 1964   CARDIAC CATHETERIZATION  08   patent grafts, no culprit lesions, EF 65%   CARDIAC CATHETERIZATION N/A 12/19/2014   Procedure: Left Heart Cath and Cors/Grafts Angiography;  Surgeon: Erin Crafts, MD;  Location: [facility] INVASIVE CV LAB;  Service: Cardiovascular;  Laterality: N/A;   CARDIAC CATHETERIZATION N/A 12/19/2014   Procedure: Coronary Stent Intervention;  Surgeon: Erin Crafts, MD;  Location: [facility] INVASIVE CV LAB;  Service: Cardiovascular;   Laterality: N/A;   CARDIAC CATHETERIZATION N/A 01/01/2015   Procedure: Left Heart Cath and Cors/Grafts Angiography;  Surgeon: Erin Crafts, MD;  Location: [facility] INVASIVE CV LAB;  Service: Cardiovascular;  Laterality: N/A;   CARDIAC CATHETERIZATION N/A 09/20/2015   Procedure: Left Heart Cath and Cors/Grafts Angiography;  Surgeon: Erin Crafts, MD;  Location: [facility] INVASIVE CV LAB;  Service: Cardiovascular;  Laterality: N/A;   CATARACT EXTRACTION W/ INTRAOCULAR LENS  IMPLANT, BILATERAL Bilateral 2011   COLONOSCOPY WITH PROPOFOL N/A 07/27/2016   Procedure: COLONOSCOPY WITH PROPOFOL;  Surgeon: Erin Rakes, MD;  Location: [facility] ENDOSCOPY;  Service: Endoscopy;  Laterality: N/A;   CORONARY ARTERY BYPASS GRAFT  2000   ASCVD, multivessel, S./P.   DILATION AND CURETTAGE OF UTERUS  1980s   ESOPHAGOGASTRODUODENOSCOPY (EGD) WITH PROPOFOL N/A 07/27/2016   Procedure: ESOPHAGOGASTRODUODENOSCOPY (EGD) WITH PROPOFOL;  Surgeon: Erin Rakes, MD;  Location: [facility] ENDOSCOPY;  Service: Endoscopy;  Laterality: N/A;   FRACTURE SURGERY     LEFT HEART CATH AND CORS/GRAFTS ANGIOGRAPHY N/A 01/12/2018   Procedure: LEFT HEART CATH AND CORS/GRAFTS ANGIOGRAPHY;  Surgeon: Erin Crafts, MD;  Location: [facility] INVASIVE CV LAB;  Service: Cardiovascular;  Laterality: N/A;   PATELLA FRACTURE SURGERY Left 1993    reports that she quit smoking about 53 years ago. Her smoking use included cigarettes. She started smoking about 60 years ago. She has a 3.5 pack-year smoking history. She has never used smokeless tobacco. She reports that she does not drink alcohol and does not use drugs. Social History   Socioeconomic History   Marital status: Married    Spouse name: Not on file   Number of children: 2   Years of education: College   Highest education level: Not on file  Occupational History   Occupation: Retired Leisure centre manager  Tobacco Use   Smoking status: Former    Current packs/day: 0.00    Average packs/day:  0.5 packs/day for 7.0 years (3.5 ttl pk-yrs)    Types: Cigarettes    Start date: 08/22/1962    Quit date: 08/21/1969    Years since quitting: 53.1   Smokeless tobacco: Never  Vaping Use   Vaping status: Never Used  Substance and Sexual Activity   Alcohol use: No    Alcohol/week: 0.0 standard drinks of alcohol    Comment: rare   Drug use: No   Sexual activity: Never  Other Topics Concern   Not on file  Social History Narrative   Social History     Social History Narrative       Lives in [facility] with husband.   Diet: Regular   Do you drink/eat things with caffeine? 1 cup caffeine per day.  Not much coffee   Marital status: Married                           What year were you married? 1964   Do you live in a house,  apartment, assisted living, condo, trailer, etc)? Here at [facility]   Is it one or more stories?   How many persons live in your home? 2   Do you have any pets in your home? No   Current or past profession: Metallurgist (RN)   Do you exercise? I did                                                Type & how often: Swimming, Tai Chi, exercise classes   Do you have a living will? yes   Do you have a DNR Form? Yes   Do you have a POA/HPOA forms?             Social Determinants of Health   Financial Resource Strain: Low Risk  (12/07/2017)   Overall Financial Resource Strain (CARDIA)    Difficulty of Paying Living Expenses: Not hard at all  Food Insecurity: No Food Insecurity (09/21/2022)   Hunger Vital Sign    Worried About Running Out of Food in the Last Year: Never true    Ran Out of Food in the Last Year: Never true  Transportation Needs: No Transportation Needs (09/21/2022)   PRAPARE - Administrator, Civil Service (Medical): No    Lack of Transportation (Non-Medical): No  Physical Activity: Inactive (12/07/2017)   Exercise Vital Sign    Days of Exercise per Week: 0 days    Minutes of Exercise per Session: 0 min  Stress: Stress Concern Present  (12/07/2017)   [employer] of Occupational Health - Occupational Stress Questionnaire    Feeling of Stress : To some extent  Social Connections: Somewhat Isolated (12/07/2017)   Social Connection and Isolation Panel [NHANES]    Frequency of Communication with Friends and Family: More than three times a week    Frequency of Social Gatherings with Friends and Family: More than three times a week    Attends Religious Services: Never    Database administrator or Organizations: No    Attends Banker Meetings: Never    Marital Status: Married  Catering manager Violence: Not At Risk (09/23/2022)   Humiliation, Afraid, Rape, and Kick questionnaire    Fear of Current or Ex-Partner: No    Emotionally Abused: No    Physically Abused: No    Sexually Abused: No    Functional Status Survey:    Family History  Problem Relation Age of Onset   Heart disease Mother    Heart attack Mother    Heart disease Father    Heart attack Father    Diabetes Brother    Pulmonary embolism Brother    Stroke Paternal Grandmother    Hypertension Neg Hx     Health Maintenance  Topic Date Due   DTaP/Tdap/Td (3 - Td or Tdap) 04/20/2022   COVID-19 Vaccine (7 - 2023-24 season) 10/22/2022 (Originally 03/19/2022)   INFLUENZA VACCINE  10/22/2022   Pneumonia Vaccine 4+ Years old  Completed   DEXA SCAN  Completed   Zoster Vaccines- Shingrix  Completed   HPV VACCINES  Aged Out    Allergies  Allergen Reactions   Nsaids Other (See Comments)    Due to chronic kidney failure   Butorphanol Other (See Comments)    agitation Constipation Produced a lot of urine, made patient feel crazy  Sulfa Antibiotics Rash   Bisoprolol Fumarate Other (See Comments)    Doesn't remember   Butorphanol Tartrate Other (See Comments)    Produced a lot of urine, made patient feel crazy   Codeine Nausea And Vomiting   Demerol [Meperidine] Nausea And Vomiting   Imipramine     Sweating, facial dysfunction     Meperidine And Related Nausea And Vomiting   Statins     MYALGIAS   Tequin [Gatifloxacin] Other (See Comments)    Caused hypoglycemia Low blood sugar   Brilinta [Ticagrelor] Rash    CAUSES PETECHIAE, PURPURA   Pseudoephedrine Hcl Palpitations   Septra [Sulfamethoxazole-Trimethoprim] Rash   Sulfamethoxazole-Trimethoprim Rash    Outpatient Encounter Medications as of 10/06/2022  Medication Sig   acetaminophen (TYLENOL) 500 MG tablet Take 1,000 mg by mouth 2 (two) times daily as needed for mild pain.   alum & mag hydroxide-simeth (MAALOX PLUS) 400-400-40 MG/5ML suspension Take 10 mLs by mouth as needed for indigestion.   atorvastatin (LIPITOR) 10 MG tablet TAKE 1 TABLET ONCE DAILY.   budesonide (PULMICORT) 0.25 MG/2ML nebulizer solution Take 2 mLs (0.25 mg total) by nebulization 2 (two) times daily.   Cholecalciferol (VITAMIN D) 50 MCG (2000 UT) tablet Take 2,000 Units by mouth daily.   clopidogrel (PLAVIX) 75 MG tablet TAKE 1 TABLET ONCE DAILY.   COENZYME Q-10 PO Take 10 mg by mouth at bedtime.   cyanocobalamin 1000 MCG tablet Take 1,000 mcg by mouth daily.   docusate sodium (COLACE) 100 MG capsule Take 100 mg by mouth daily as needed for mild constipation.   fluticasone (FLONASE) 50 MCG/ACT nasal spray Place 1 spray into both nostrils daily as needed for allergies.   [START ON 10/07/2022] furosemide (LASIX) 20 MG tablet Take 40 mg by mouth daily.   furosemide (LASIX) 40 MG tablet Take 1 tablet (40 mg total) by mouth 2 (two) times daily for 2 days.   ipratropium-albuterol (DUONEB) 0.5-2.5 (3) MG/3ML SOLN Take 3 mLs by nebulization 2 (two) times daily.   ketoconazole (NIZORAL) 2 % shampoo Apply 1 Application topically 2 (two) times a week. Tuesday and Friday   levocetirizine (XYZAL) 5 MG tablet TAKE 1 TABLET ONCE DAILY IN THE EVENING.   levothyroxine (SYNTHROID) 75 MCG tablet TAKE 1 TABLET ONCE DAILY ON EMPTY STOMACH.   melatonin 1 MG TABS tablet Take 1 mg by mouth at bedtime.   memantine  (NAMENDA) 10 MG tablet Take 10 mg by mouth at bedtime.   Menthol, Topical Analgesic, (BIOFREEZE) 10 % LIQD Apply 1 Application topically as needed (pain).   metoCLOPramide (REGLAN) 5 MG tablet Take 5 mg by mouth in the morning and at bedtime.   metoprolol succinate (TOPROL-XL) 25 MG 24 hr tablet Take 25 mg by mouth daily. Take with or immediately following a meal.   miconazole (MICOTIN) 2 % powder Apply topically at bedtime.  Apply to groin and pannus topically   MYRBETRIQ 50 MG TB24 tablet TAKE 1 TABLET BY MOUTH DAILY.   nitroGLYCERIN (NITROSTAT) 0.4 MG SL tablet Place 0.4 mg under the tongue every 5 (five) minutes as needed for chest pain. Ingram 3 doses   pantoprazole (PROTONIX) 40 MG tablet Take 1 tablet (40 mg total) by mouth 2 (two) times daily.   predniSONE (DELTASONE) 5 MG tablet Take 5 mg by mouth daily.   PSYLLIUM HUSK PO Take 0.4 g by mouth daily. May keep at bedside   venlafaxine (EFFEXOR) 75 MG tablet Take 150 mg by mouth daily.   zinc  oxide 20 % ointment Apply 1 application topically as needed for irritation.   No facility-administered encounter medications on file as of 10/06/2022.    Review of Systems  Constitutional:  Positive for activity change and fatigue.  HENT: Negative.    Respiratory:  Positive for cough and shortness of breath.   Cardiovascular:  Positive for leg swelling.  Gastrointestinal: Negative.   Genitourinary:  Positive for urgency.  Musculoskeletal:  Positive for arthralgias and gait problem.  Psychiatric/Behavioral:  Positive for confusion and decreased concentration.     There were no vitals filed for this visit. There is no height or weight on file to calculate BMI. Physical Exam Vitals and nursing note reviewed.  Constitutional:      Appearance: Normal appearance.  HENT:     Head: Normocephalic.     Mouth/Throat:     Mouth: Mucous membranes are moist.     Pharynx: Oropharynx is clear.  Eyes:     Pupils: Pupils are equal, round, and reactive to  light.  Cardiovascular:     Rate and Rhythm: Normal rate and regular rhythm.  Pulmonary:     Comments: Increased work of breathing.  No rales appreciated tube in place with sat of 91% Abdominal:     General: Bowel sounds are normal.     Palpations: Abdomen is soft.  Musculoskeletal:        General: Tenderness present.     Comments: Multiple joints tender secondary to arthritis  Neurological:     General: No focal deficit present.     Mental Status: She is alert.  Psychiatric:        Mood and Affect: Mood normal.        Behavior: Behavior normal.     Labs reviewed: Basic Metabolic Panel: Recent Labs    09/27/22 0343 09/28/22 0659 09/29/22 0746 09/30/22 0428 10/01/22 0402 10/02/22 0338  NA 139 134* 135 132* 131* 135  K 3.7 3.9 3.7 4.2 3.9 4.0  CL 102 97* 96* 95* 97* 98  CO2 25 26 27 25 24 28   GLUCOSE 126* 130* 107* 108* 90 88  BUN 38* 45* 60* 70* 58* 53*  CREATININE 1.23* 1.31* 1.56* 2.14* 1.53* 1.50*  CALCIUM 9.3 9.7 9.6 9.4 8.8* 9.1  MG 2.0 1.6* 2.2  --   --   --    Liver Function Tests: Recent Labs    09/21/22 0423 09/24/22 0305 09/25/22 0339  AST 15 15 15   ALT 13 12 12   ALKPHOS 37* 42 43  BILITOT 0.5 0.5 0.4  PROT 6.1* 5.8* 5.9*  ALBUMIN 3.0* 2.7* 2.7*   No results for input(s): "LIPASE", "AMYLASE" in the last 8760 hours. No results for input(s): "AMMONIA" in the last 8760 hours. CBC: Recent Labs    12/05/21 0000 09/21/22 0423 09/22/22 0744 09/26/22 0338 09/30/22 0428 10/01/22 0854  WBC 12.2 16.0*   < > 15.9* 20.0* 15.9*  NEUTROABS 8,747.00 12.9*  --   --   --   --   HGB 10.3* 9.0*   < > 9.5* 10.3* 9.9*  HCT 32* 29.1*   < > 30.5* 33.9* 31.7*  MCV  --  105.1*   < > 104.5* 104.3* 101.6*  PLT 268 310   < > 319 363 338   < > = values in this interval not displayed.   Cardiac Enzymes: No results for input(s): "CKTOTAL", "CKMB", "CKMBINDEX", "TROPONINI" in the last 8760 hours. BNP: Invalid input(s): "POCBNP" Lab Results  Component Value Date  HGBA1C 5.1 02/14/2019   Lab Results  Component Value Date   TSH 3.56 04/01/2021   Lab Results  Component Value Date   VITAMINB12 869 08/24/2020   Lab Results  Component Value Date   FOLATE 816 08/04/2019   Lab Results  Component Value Date   IRON 47 05/23/2021   TIBC 282 05/23/2021   FERRITIN 25 05/23/2021    Imaging and Procedures obtained prior to SNF admission: ECHOCARDIOGRAM COMPLETE  Result Date: 09/22/2022    ECHOCARDIOGRAM REPORT   Patient Name:   Erin Ingram Date of Exam: 09/22/2022 Medical Rec #:  [id number]        Height:       58.0 in Accession #:    [id number]       Weight:       188.5 lb Date of Birth:  1940-04-27        BSA:          1.776 m Patient Age:    82 years         BP:           128/57 mmHg Patient Gender: F                HR:           52 bpm. Exam Location:  Inpatient Procedure: 2D Echo, Cardiac Doppler, Color Doppler and Intracardiac            Opacification Agent Indications:     CHF - Acute Diastolic  History:         Patient has prior history of Echocardiogram examinations, most                  recent 07/18/2018. CHF, CAD, Prior CABG, PAD, CKD and TIA,                  Signs/Symptoms:elevated troponin, Edema and Dyspnea; Risk                  Factors:Hypertension, Dyslipidemia, Sleep Apnea and Former                  Smoker.  Sonographer:     Wallie Char Referring Phys:  [id number] DAVID MANUEL ORTIZ Diagnosing Phys: Charlton Haws MD IMPRESSIONS  1. Global hypokinesis worse in septum and apex. Left ventricular ejection fraction, by estimation, is 30 to 35%. The left ventricle has moderately decreased function. The left ventricle demonstrates regional wall motion abnormalities (see scoring diagram/findings for description). The left ventricular internal cavity size was moderately dilated. Left ventricular diastolic parameters are indeterminate.  2. Right ventricular systolic function is normal. The right ventricular size is normal.  3. The mitral valve is  degenerative. No evidence of mitral valve regurgitation. No evidence of mitral stenosis. Severe mitral annular calcification.  4. The aortic valve is tricuspid. There is moderate calcification of the aortic valve. There is moderate thickening of the aortic valve. Aortic valve regurgitation is not visualized. Mild aortic valve stenosis.  5. The inferior vena cava is normal in size with greater than 50% respiratory variability, suggesting right atrial pressure of 3 mmHg. FINDINGS  Left Ventricle: Global hypokinesis worse in septum and apex. Left ventricular ejection fraction, by estimation, is 30 to 35%. The left ventricle has moderately decreased function. The left ventricle demonstrates regional wall motion abnormalities. Definity contrast agent was given IV to delineate the left ventricular endocardial borders. The left ventricular internal cavity size was moderately dilated. There is no left ventricular  hypertrophy. Left ventricular diastolic parameters are indeterminate. Right Ventricle: The right ventricular size is normal. No increase in right ventricular wall thickness. Right ventricular systolic function is normal. Left Atrium: Left atrial size was normal in size. Right Atrium: Right atrial size was normal in size. Pericardium: There is no evidence of pericardial effusion. Mitral Valve: The mitral valve is degenerative in appearance. There is mild thickening of the mitral valve leaflet(s). There is mild calcification of the mitral valve leaflet(s). Severe mitral annular calcification. No evidence of mitral valve regurgitation. No evidence of mitral valve stenosis. MV peak gradient, 7.2 mmHg. The mean mitral valve gradient is 4.0 mmHg. Tricuspid Valve: The tricuspid valve is normal in structure. Tricuspid valve regurgitation is mild . No evidence of tricuspid stenosis. Aortic Valve: The aortic valve is tricuspid. There is moderate calcification of the aortic valve. There is moderate thickening of the aortic  valve. Aortic valve regurgitation is not visualized. Mild aortic stenosis is present. Aortic valve mean gradient measures 5.0 mmHg. Aortic valve peak gradient measures 8.0 mmHg. Aortic valve area, by VTI measures 1.73 cm. Pulmonic Valve: The pulmonic valve was normal in structure. Pulmonic valve regurgitation is trivial. No evidence of pulmonic stenosis. Aorta: The aortic root is normal in size and structure. Venous: The inferior vena cava is normal in size with greater than 50% respiratory variability, suggesting right atrial pressure of 3 mmHg. IAS/Shunts: No atrial level shunt detected by color flow Doppler.  LEFT VENTRICLE PLAX 2D LVIDd:         4.60 cm      Diastology LVIDs:         3.50 cm      LV e' medial:    4.10 cm/s LV PW:         0.90 cm      LV E/e' medial:  30.5 LV IVS:        0.80 cm      LV e' lateral:   6.19 cm/s LVOT diam:     1.70 cm      LV E/e' lateral: 20.2 LV SV:         48 LV SV Index:   27 LVOT Area:     2.27 cm  LV Volumes (MOD) LV vol d, MOD A2C: 126.0 ml LV vol d, MOD A4C: 126.0 ml LV vol s, MOD A2C: 86.1 ml LV vol s, MOD A4C: 87.1 ml LV SV MOD A2C:     39.9 ml LV SV MOD A4C:     126.0 ml LV SV MOD BP:      40.1 ml RIGHT VENTRICLE             IVC RV Basal diam:  3.50 cm     IVC diam: 1.90 cm RV S prime:     10.10 cm/s TAPSE (M-mode): 1.0 cm LEFT ATRIUM             Index        RIGHT ATRIUM           Index LA diam:        3.20 cm 1.80 cm/m   RA Area:     16.20 cm LA Vol (A2C):   59.9 ml 33.73 ml/m  RA Volume:   43.40 ml  24.44 ml/m LA Vol (A4C):   63.9 ml 35.98 ml/m LA Biplane Vol: 61.9 ml 34.86 ml/m  AORTIC VALVE AV Area (Vmax):    1.69 cm AV Area (Vmean):   1.56 cm AV Area (VTI):  1.73 cm AV Vmax:           141.50 cm/s AV Vmean:          103.000 cm/s AV VTI:            0.278 m AV Peak Grad:      8.0 mmHg AV Mean Grad:      5.0 mmHg LVOT Vmax:         105.40 cm/s LVOT Vmean:        70.850 cm/s LVOT VTI:          0.212 m LVOT/AV VTI ratio: 0.76  AORTA Ao Root diam: 2.70 cm Ao  Asc diam:  3.20 cm MITRAL VALVE                TRICUSPID VALVE MV Area (PHT): 4.68 cm     TR Peak grad:   56.0 mmHg MV Area VTI:   1.65 cm     TR Vmax:        374.00 cm/s MV Peak grad:  7.2 mmHg MV Mean grad:  4.0 mmHg     SHUNTS MV Vmax:       1.34 m/s     Systemic VTI:  0.21 m MV Vmean:      87.3 cm/s    Systemic Diam: 1.70 cm MV Decel Time: 162 msec MV E velocity: 125.00 cm/s MV A velocity: 133.00 cm/s MV E/A ratio:  0.94 Charlton Haws MD Electronically signed by Charlton Haws MD Signature Date/Time: 09/22/2022/11:24:09 AM    Final (Updated)    DG Chest Portable 1 View  Result Date: 09/21/2022 CLINICAL DATA:  Dyspnea and shortness of breath. 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 Thoracic aorta is heavily calcified with stable mediastinal borders. There is increased central vascular engorgement and moderate perihilar interstitial and ground-glass edema. Interstitial edema continues into the bases where there are small pleural effusions forming, as well as patchy opacities which may represent additional edema or superimposed pneumonia. There are degenerative changes and slight dextroscoliosis thoracic spine. IMPRESSION: 1. CHF or fluid overload with perihilar interstitial and ground-glass edema. 2. Developing small pleural effusions. 3. Patchy opacities in the bases which may be due to additional edema or superimposed pneumonia. Electronically Signed   By: Almira Bar M.D.   On: 09/21/2022 05:53    Assessment/Plan 1. Chronic heart failure with preserved ejection fraction (HCC) Increased shortness of breath yesterday started on supplemental (extra) doses of diuretic.  Labs are pending for today  2. Acute pulmonary edema (HCC) Aggressively diuresed in the hospital patient lost about 10 L of fluid per hospital notes    4. Morbid obesity (HCC)   5. Acquired hypothyroidism Continue thyroid supplementation  6. High risk  medication use Labs pending today.  Will require close follow-up  7. Gait instability Uses walker and wheelchair depending on distance needed to travel  8. Essential hypertension Trialed with metoprolol    Family/ staff Communication:   Labs/tests ordered:  Bertram Millard. Hyacinth Meeker, MD [facility] [address] [place], [place] [address] Office [phone]

## 2022-10-06 NOTE — Progress Notes (Deleted)
Office Visit    Patient Name: LYNNLEY DODDRIDGE Date of Encounter: 10/06/2022  Primary Care Provider:  Mast, Man X, NP Primary Cardiologist:  Lance Muss, MD  Chief Complaint  82 year  old female with past medical history of CAD s/p CABG in 2000, polymyalgia rheumatica, TIA, OSA on CPAP, HTN, hypothyroidism, HLD, CKD, left subclavian artery stenosis, chronic HFpEF and morbid obesity. She presents today for hospital follow up.   Past Medical History    Past Medical History:  Diagnosis Date   Anemia    Arthritis    "fingers, back, shoulders, hips" (04/10/2016)   Chronic diastolic CHF (congestive heart failure) (HCC)    a. normal EF, LVEDP at [facility] in 6/17 33 >> Lasix started    CKD (chronic kidney disease), stage III (HCC)    Coronary artery disease    a. s/p CABG 2000 (SVG/ free LIMA Y graft to the diagonal and distal LAD, SVG to the OM 1, and SVG to the PDA) . b. Cath 12/19/2014 90% dSVG to RCA s/p 2 overlapping DES. c. LHC 2016, 2017 no intervention except diuresis needed. d. Low risk nuc 03/2016. e. Cath 12/2017 patent LIMA-LAD, SVG-OM, SVG-PDA but occluded SVG to diag.    Depression    with anxious component   GERD (gastroesophageal reflux disease)    History of hiatal hernia    Hyperlipidemia    Hypothyroidism    Obesity    OSA on CPAP    PMR (polymyalgia rheumatica) (HCC)    Pneumonia    "2-3 times" (04/10/2016)   Stable angina    microvascular, improved with Ranexa   Subclavian artery stenosis (HCC)    a. L by cath note in 2016.   TIA (transient ischemic attack)    Past Surgical History:  Procedure Laterality Date   BREAST BIOPSY Right 1964   CARDIAC CATHETERIZATION  08   patent grafts, no culprit lesions, EF 65%   CARDIAC CATHETERIZATION N/A 12/19/2014   Procedure: Left Heart Cath and Cors/Grafts Angiography;  Surgeon: Corky Crafts, MD;  Location: [facility] INVASIVE CV LAB;  Service: Cardiovascular;  Laterality: N/A;   CARDIAC CATHETERIZATION N/A 12/19/2014    Procedure: Coronary Stent Intervention;  Surgeon: Corky Crafts, MD;  Location: [facility] INVASIVE CV LAB;  Service: Cardiovascular;  Laterality: N/A;   CARDIAC CATHETERIZATION N/A 01/01/2015   Procedure: Left Heart Cath and Cors/Grafts Angiography;  Surgeon: Corky Crafts, MD;  Location: [facility] INVASIVE CV LAB;  Service: Cardiovascular;  Laterality: N/A;   CARDIAC CATHETERIZATION N/A 09/20/2015   Procedure: Left Heart Cath and Cors/Grafts Angiography;  Surgeon: Corky Crafts, MD;  Location: [facility] INVASIVE CV LAB;  Service: Cardiovascular;  Laterality: N/A;   CATARACT EXTRACTION W/ INTRAOCULAR LENS  IMPLANT, BILATERAL Bilateral 2011   COLONOSCOPY WITH PROPOFOL N/A 07/27/2016   Procedure: COLONOSCOPY WITH PROPOFOL;  Surgeon: Charlott Rakes, MD;  Location: [facility] ENDOSCOPY;  Service: Endoscopy;  Laterality: N/A;   CORONARY ARTERY BYPASS GRAFT  2000   ASCVD, multivessel, S./P.   DILATION AND CURETTAGE OF UTERUS  1980s   ESOPHAGOGASTRODUODENOSCOPY (EGD) WITH PROPOFOL N/A 07/27/2016   Procedure: ESOPHAGOGASTRODUODENOSCOPY (EGD) WITH PROPOFOL;  Surgeon: Charlott Rakes, MD;  Location: [facility] ENDOSCOPY;  Service: Endoscopy;  Laterality: N/A;   FRACTURE SURGERY     LEFT HEART CATH AND CORS/GRAFTS ANGIOGRAPHY N/A 01/12/2018   Procedure: LEFT HEART CATH AND CORS/GRAFTS ANGIOGRAPHY;  Surgeon: Corky Crafts, MD;  Location: [facility] INVASIVE CV LAB;  Service: Cardiovascular;  Laterality: N/A;   PATELLA  FRACTURE SURGERY Left 1993    Allergies  Allergies  Allergen Reactions   Nsaids Other (See Comments)    Due to chronic kidney failure   Butorphanol Other (See Comments)    agitation Constipation Produced a lot of urine, made patient feel crazy   Sulfa Antibiotics Rash   Bisoprolol Fumarate Other (See Comments)    Doesn't remember   Butorphanol Tartrate Other (See Comments)    Produced a lot of urine, made patient feel crazy   Codeine Nausea And Vomiting   Demerol [Meperidine] Nausea And Vomiting    Imipramine     Sweating, facial dysfunction    Meperidine And Related Nausea And Vomiting   Statins     MYALGIAS   Tequin [Gatifloxacin] Other (See Comments)    Caused hypoglycemia Low blood sugar   Brilinta [Ticagrelor] Rash    CAUSES PETECHIAE, PURPURA   Pseudoephedrine Hcl Palpitations   Septra [Sulfamethoxazole-Trimethoprim] Rash   Sulfamethoxazole-Trimethoprim Rash     Labs/Other Studies Reviewed    The following studies were reviewed today: *** Cardiac Studies & Procedures   CARDIAC CATHETERIZATION  CARDIAC CATHETERIZATION 01/12/2018  Narrative  Prox LAD to Mid LAD lesion is 25% stenosed.  Ost Cx to Dist Cx lesion is 90% stenosed. SVG to OM is widely patent.  Mid RCA lesion is 90% stenosed. SVG to PDA is patent. Patent stents in distal graft.  SVG to diagonal is occluded.  Mid LAD lesion is 90% stenosed. LIMA to LAD is patent.  The left ventricular systolic function is normal.  LV end diastolic pressure is normal. LVEDP 10 mm Hg.  The left ventricular ejection fraction is 55-65% by visual estimate.  There is no aortic valve stenosis.    Continue aggressive secondary prevention. Consider discharge later today.  Findings Coronary Findings Diagnostic  Dominance: Right  Left Anterior Descending Prox LAD to Mid LAD lesion is 25% stenosed. Mid LAD lesion is 90% stenosed. The lesion is segmental.  Ramus Intermedius Vessel is small.  Left Circumflex Ost Cx to Dist Cx lesion is 90% stenosed. The lesion is segmental.  Right Coronary Artery Mid RCA lesion is 90% stenosed.  Single Graft Graft To 2nd Mrg and is normal in caliber. The graft exhibits minimal luminal irregularities. Dist Graft lesion is 25% stenosed.  LIMA Graft To Dist LAD LIMA and is normal in caliber. Y graft with LIMA to LAD and SVG to diagonal.  LIMA portion is patent while vein graft to diagonal is occluded.  Single Graft Graft To 2nd Diag Y graft. LIMA to LAD (patent); SVG to  diagonal (occluded) Prox Graft lesion is 100% stenosed. Dist Graft lesion is 25% stenosed.  Single Graft Graft To RPDA and is large. Non-stenotic Dist Graft lesion was previously treated. Focal 90% with moderate disease on both sides.  Intervention  No interventions have been documented.   CARDIAC CATHETERIZATION  CARDIAC CATHETERIZATION 09/20/2015  Narrative  Severe three vessel CAD.  Patent SVG to OM.  Patent SVG to PDA. Patent stents in this graft.  Y graft with free LIMA to LAD and SVG to diagonal. LIMA portion is patent while vein graft to diagonal is occluded.  Elevated LVEDP 33 mm Hg.  No change in coronary anatomy since October 2016. Patent stents in the SVG to PDA. Elevated LVEDP. Would diurese with Lasix to help reduce intravascular volume.  Findings Coronary Findings Diagnostic  Dominance: Right  Left Anterior Descending  Diffuse.  Ramus Intermedius . Vessel is small.  Left Circumflex Diffuse.  Right  Coronary Artery  Single Graft Graft To 2nd Mrg SVG was injected is normal in caliber. The graft exhibits minimal luminal irregularities.  LIMA Graft To Dist LAD LIMA is normal in caliber. Y graft with LIMA to LAD and SVG to diagonal.  LIMA portion is patent while vein graft to diagonal is occluded.  Single Graft Graft To 2nd Diag SVG was injected . Y graft. LIMA to LAD (patent); SVG to diagonal (occluded)  Single Graft Graft To RPDA SVG was injected is large. Previously placed Dist Graft drug eluting stent is patent. Focal 90% with moderate disease on both sides.  Intervention  No interventions have been documented.   STRESS TESTS  NM MYOCAR MULTI W/SPECT W (Needs Review) 10/15/2018 This result has not been signed. Information might be incomplete.  Narrative  Defect 1: There is a medium defect of severe severity present in the basal anteroseptal, basal inferoseptal, mid anteroseptal, mid inferoseptal and apical septal location.  Findings  consistent with prior myocardial infarction.  This is an intermediate risk study.  There was no ST segment deviation noted during stress.  T wave inversion was noted at rest in the I and aVL leads. T wave inversion persisted and were unchanged.  There is absence of perfusion in the ventricular septum from apex to base, with no reversibility. Suggests previous infarction. No ischemia noted. EF visually appears to be 50%. Akinesis due to absent tracer uptake in ventricular septum, remainder of wall motion is grossly normal.   ECHOCARDIOGRAM  ECHOCARDIOGRAM COMPLETE 09/22/2022  Narrative ECHOCARDIOGRAM REPORT    Patient Name:   NAYLAH CORK Date of Exam: 09/22/2022 Medical Rec #:  [id number]        Height:       58.0 in Accession #:    [id number]       Weight:       188.5 lb Date of Birth:  1940/06/22        BSA:          1.776 m Patient Age:    82 years         BP:           128/57 mmHg Patient Gender: F                HR:           52 bpm. Exam Location:  Inpatient  Procedure: 2D Echo, Cardiac Doppler, Color Doppler and Intracardiac Opacification Agent  Indications:     CHF - Acute Diastolic  History:         Patient has prior history of Echocardiogram examinations, most recent 07/18/2018. CHF, CAD, Prior CABG, PAD, CKD and TIA, Signs/Symptoms:elevated troponin, Edema and Dyspnea; Risk Factors:Hypertension, Dyslipidemia, Sleep Apnea and Former Smoker.  Sonographer:     Wallie Char Referring Phys:  [id number] DAVID MANUEL ORTIZ Diagnosing Phys: Charlton Haws MD  IMPRESSIONS   1. Global hypokinesis worse in septum and apex. Left ventricular ejection fraction, by estimation, is 30 to 35%. The left ventricle has moderately decreased function. The left ventricle demonstrates regional wall motion abnormalities (see scoring diagram/findings for description). The left ventricular internal cavity size was moderately dilated. Left ventricular diastolic parameters are  indeterminate. 2. Right ventricular systolic function is normal. The right ventricular size is normal. 3. The mitral valve is degenerative. No evidence of mitral valve regurgitation. No evidence of mitral stenosis. Severe mitral annular calcification. 4. The aortic valve is tricuspid. There is moderate calcification of the aortic valve. There is moderate  thickening of the aortic valve. Aortic valve regurgitation is not visualized. Mild aortic valve stenosis. 5. The inferior vena cava is normal in size with greater than 50% respiratory variability, suggesting right atrial pressure of 3 mmHg.  FINDINGS Left Ventricle: Global hypokinesis worse in septum and apex. Left ventricular ejection fraction, by estimation, is 30 to 35%. The left ventricle has moderately decreased function. The left ventricle demonstrates regional wall motion abnormalities. Definity contrast agent was given IV to delineate the left ventricular endocardial borders. The left ventricular internal cavity size was moderately dilated. There is no left ventricular hypertrophy. Left ventricular diastolic parameters are indeterminate.  Right Ventricle: The right ventricular size is normal. No increase in right ventricular wall thickness. Right ventricular systolic function is normal.  Left Atrium: Left atrial size was normal in size.  Right Atrium: Right atrial size was normal in size.  Pericardium: There is no evidence of pericardial effusion.  Mitral Valve: The mitral valve is degenerative in appearance. There is mild thickening of the mitral valve leaflet(s). There is mild calcification of the mitral valve leaflet(s). Severe mitral annular calcification. No evidence of mitral valve regurgitation. No evidence of mitral valve stenosis. MV peak gradient, 7.2 mmHg. The mean mitral valve gradient is 4.0 mmHg.  Tricuspid Valve: The tricuspid valve is normal in structure. Tricuspid valve regurgitation is mild . No evidence of tricuspid  stenosis.  Aortic Valve: The aortic valve is tricuspid. There is moderate calcification of the aortic valve. There is moderate thickening of the aortic valve. Aortic valve regurgitation is not visualized. Mild aortic stenosis is present. Aortic valve mean gradient measures 5.0 mmHg. Aortic valve peak gradient measures 8.0 mmHg. Aortic valve area, by VTI measures 1.73 cm.  Pulmonic Valve: The pulmonic valve was normal in structure. Pulmonic valve regurgitation is trivial. No evidence of pulmonic stenosis.  Aorta: The aortic root is normal in size and structure.  Venous: The inferior vena cava is normal in size with greater than 50% respiratory variability, suggesting right atrial pressure of 3 mmHg.  IAS/Shunts: No atrial level shunt detected by color flow Doppler.   LEFT VENTRICLE PLAX 2D LVIDd:         4.60 cm      Diastology LVIDs:         3.50 cm      LV e' medial:    4.10 cm/s LV PW:         0.90 cm      LV E/e' medial:  30.5 LV IVS:        0.80 cm      LV e' lateral:   6.19 cm/s LVOT diam:     1.70 cm      LV E/e' lateral: 20.2 LV SV:         48 LV SV Index:   27 LVOT Area:     2.27 cm  LV Volumes (MOD) LV vol d, MOD A2C: 126.0 ml LV vol d, MOD A4C: 126.0 ml LV vol s, MOD A2C: 86.1 ml LV vol s, MOD A4C: 87.1 ml LV SV MOD A2C:     39.9 ml LV SV MOD A4C:     126.0 ml LV SV MOD BP:      40.1 ml  RIGHT VENTRICLE             IVC RV Basal diam:  3.50 cm     IVC diam: 1.90 cm RV S prime:     10.10 cm/s TAPSE (M-mode): 1.0 cm  LEFT ATRIUM             Index        RIGHT ATRIUM           Index LA diam:        3.20 cm 1.80 cm/m   RA Area:     16.20 cm LA Vol (A2C):   59.9 ml 33.73 ml/m  RA Volume:   43.40 ml  24.44 ml/m LA Vol (A4C):   63.9 ml 35.98 ml/m LA Biplane Vol: 61.9 ml 34.86 ml/m AORTIC VALVE AV Area (Vmax):    1.69 cm AV Area (Vmean):   1.56 cm AV Area (VTI):     1.73 cm AV Vmax:           141.50 cm/s AV Vmean:          103.000 cm/s AV VTI:             0.278 m AV Peak Grad:      8.0 mmHg AV Mean Grad:      5.0 mmHg LVOT Vmax:         105.40 cm/s LVOT Vmean:        70.850 cm/s LVOT VTI:          0.212 m LVOT/AV VTI ratio: 0.76  AORTA Ao Root diam: 2.70 cm Ao Asc diam:  3.20 cm  MITRAL VALVE                TRICUSPID VALVE MV Area (PHT): 4.68 cm     TR Peak grad:   56.0 mmHg MV Area VTI:   1.65 cm     TR Vmax:        374.00 cm/s MV Peak grad:  7.2 mmHg MV Mean grad:  4.0 mmHg     SHUNTS MV Vmax:       1.34 m/s     Systemic VTI:  0.21 m MV Vmean:      87.3 cm/s    Systemic Diam: 1.70 cm MV Decel Time: 162 msec MV E velocity: 125.00 cm/s MV A velocity: 133.00 cm/s MV E/A ratio:  0.94  Charlton Haws MD Electronically signed by Charlton Haws MD Signature Date/Time: 09/22/2022/11:24:09 AM    Final (Updated)            Recent Labs: 09/21/2022: B Natriuretic Peptide 1,723.8 09/25/2022: ALT 12 09/29/2022: Magnesium 2.2 10/01/2022: Hemoglobin 9.9; Platelets 338 10/02/2022: BUN 53; Creatinine, Ser 1.50; Potassium 4.0; Sodium 135  Recent Lipid Panel    Component Value Date/Time   CHOL 151 08/24/2020 0000   TRIG 162 (A) 08/24/2020 0000   HDL 57 08/24/2020 0000   CHOLHDL 2.1 06/07/2018 0710   VLDL 40 01/12/2018 0549   LDLCALC 70 08/24/2020 0000   LDLCALC 65 06/07/2018 0710    History of Present Illness  82 year  old female with past medical history of CAD s/p CABG in 2000, polymyalgia rheumatica, TIA, OSA on CPAP, HTN, hypothyroidism, HLD, CKD, left subclavian artery stenosis, chronic HFpEF and morbid obesity.  In 2000 she had CABG with SVG/free LIMA Y graft to diagonal and distal LAD, SVG to OM1 and SVG to PAD. She underwent 2 DES to SVG to RCA in 11/2014. In 2017 she had LHC that showed stable anatomy with occluded SVG-diagonal, followed by a low risk Myoview in 2018.   On 01/11/18 she presented to the to ED with a weeks worth of intermittent chest pain, left arm pain and back pain, mostly occurred at night when  at rest. He troponins  were negative times three. Given symptoms she underwent cardiac cath on 01/12/18 that indicated patent LIMA to LAD, SVG to OM, SVG to PDA but occluded SVG to diagonal. Normal LVEDP and EF noted at 55 to 65%. It was recommended she continue medical therapy.   In 07/2018 she was hospitalized for lightheadedness and presyncope. Her BP medications were decreased. In 03/2021 she had a fall, CT head was negative.    On 09/21/2022 she presented to the ED with worsening complaints of shortness of breath and bilateral peripheral edema for the week prior.  She was noted to have some degree of chronic shortness of breath, however it was occurring at rest with minimal activity.  Her BNP was elevated at 1700+ and chest x-ray was suggestive of pulmonary congestion.  She required BiPAP and IV Lasix.  Her troponins were elevated but were flat, 278-270.  Echo during admission showed ejection fraction of 30 to 35%, moderate left ventricular enlargement and mild aortic stenosis.  During her admission she developed an AKI with creatinine increasing to 2.4.  Her Sherryll Burger, Farxiga, spironolactone and Lasix were held.  She was discharged on 20 mg daily of Lasix.   Chronic HFpEF:  Euvolemic? During admission started GDMT consisting of Lasix, spironolactone, Entresto, Toprol and [place]. She developed an AKI with creatinine increasing to 2.4, her entresto, farxiga, spironolactone and lasix were held. Resumed 20mg  lasix at discharge.  Plan for repeat echocardiogram in 3 months, if LV function remains decreased could consider ischemic evaluation?   CAD without angina: History of CABG in 2000, cath in 12/2017 indicated occluded SVG to diag, patent LIMA to LAD, patent SVG-PDA and patent stents in distal graft.  Anginal symptoms? Continue Plavix, atorvastatin, Toprol  HTN: Blood pressure today  Continue   Hyperlipidemia:   CKD stage 3b:   Home Medications    Current Outpatient Medications  Medication Sig Dispense Refill    acetaminophen (TYLENOL) 500 MG tablet Take 1,000 mg by mouth 2 (two) times daily as needed for mild pain.     alum & mag hydroxide-simeth (MAALOX PLUS) 400-400-40 MG/5ML suspension Take 10 mLs by mouth as needed for indigestion.     budesonide (PULMICORT) 0.25 MG/2ML nebulizer solution Take 2 mLs (0.25 mg total) by nebulization 2 (two) times daily. 60 mL 12   Cholecalciferol (VITAMIN D) 50 MCG (2000 UT) tablet Take 2,000 Units by mouth daily.     clopidogrel (PLAVIX) 75 MG tablet TAKE 1 TABLET ONCE DAILY. 90 tablet 3   COENZYME Q-10 PO Take 10 mg by mouth at bedtime.     cyanocobalamin 1000 MCG tablet Take 1,000 mcg by mouth daily.     docusate sodium (COLACE) 100 MG capsule Take 100 mg by mouth daily as needed for mild constipation.     fluticasone (FLONASE) 50 MCG/ACT nasal spray Place 1 spray into both nostrils daily as needed for allergies.     [START ON 10/07/2022] furosemide (LASIX) 20 MG tablet Take 40 mg by mouth daily.     furosemide (LASIX) 40 MG tablet Take 1 tablet (40 mg total) by mouth 2 (two) times daily for 2 days.     ipratropium-albuterol (DUONEB) 0.5-2.5 (3) MG/3ML SOLN Take 3 mLs by nebulization 2 (two) times daily. 360 mL 0   ketoconazole (NIZORAL) 2 % shampoo Apply 1 Application topically 2 (two) times a week. Tuesday and Friday     levocetirizine (XYZAL) 5 MG tablet TAKE 1 TABLET ONCE DAILY IN THE EVENING. 90  tablet 1   levothyroxine (SYNTHROID) 75 MCG tablet TAKE 1 TABLET ONCE DAILY ON EMPTY STOMACH. 90 tablet 1   melatonin 1 MG TABS tablet Take 1 mg by mouth at bedtime.     memantine (NAMENDA) 10 MG tablet Take 10 mg by mouth at bedtime.     Menthol, Topical Analgesic, (BIOFREEZE) 10 % LIQD Apply 1 Application topically as needed (pain).     metoCLOPramide (REGLAN) 5 MG tablet Take 5 mg by mouth in the morning and at bedtime.     metoprolol succinate (TOPROL-XL) 25 MG 24 hr tablet Take 25 mg by mouth daily. Take with or immediately following a meal.     miconazole  (MICOTIN) 2 % powder Apply topically at bedtime.  Apply to groin and pannus topically     MYRBETRIQ 50 MG TB24 tablet TAKE 1 TABLET BY MOUTH DAILY. 90 tablet 0   nitroGLYCERIN (NITROSTAT) 0.4 MG SL tablet Place 0.4 mg under the tongue every 5 (five) minutes as needed for chest pain. X 3 doses     pantoprazole (PROTONIX) 40 MG tablet Take 1 tablet (40 mg total) by mouth 2 (two) times daily. 60 tablet 3   predniSONE (DELTASONE) 5 MG tablet Take 5 mg by mouth daily.     PSYLLIUM HUSK PO Take 0.4 g by mouth daily. May keep at bedside     venlafaxine (EFFEXOR) 75 MG tablet Take 150 mg by mouth daily.     zinc oxide 20 % ointment Apply 1 application topically as needed for irritation.     No current facility-administered medications for this visit.     Review of Systems    ***.  All other systems reviewed and are otherwise negative except as noted above.    Physical Exam    VS:  There were no vitals taken for this visit. , BMI There is no height or weight on file to calculate BMI.     GEN: Well nourished, well developed, in no acute distress. HEENT: normal. Neck: Supple, no JVD, carotid bruits, or masses. Cardiac: RRR, no murmurs, rubs, or gallops. No clubbing, cyanosis, edema.  Radials/DP/PT 2+ and equal bilaterally.  Respiratory:  Respirations regular and unlabored, clear to auscultation bilaterally. GI: Soft, nontender, nondistended, BS + x 4. MS: no deformity or atrophy. Skin: warm and dry, no rash. Neuro:  Strength and sensation are intact. Psych: Normal affect.  Accessory Clinical Findings    ECG personally reviewed by me today -    - no acute changes.   Lab Results  Component Value Date   WBC 15.9 (H) 10/01/2022   HGB 9.9 (L) 10/01/2022   HCT 31.7 (L) 10/01/2022   MCV 101.6 (H) 10/01/2022   PLT 338 10/01/2022   Lab Results  Component Value Date   CREATININE 1.50 (H) 10/02/2022   BUN 53 (H) 10/02/2022   NA 135 10/02/2022   K 4.0 10/02/2022   CL 98 10/02/2022   CO2  28 10/02/2022   Lab Results  Component Value Date   ALT 12 09/25/2022   AST 15 09/25/2022   ALKPHOS 43 09/25/2022   BILITOT 0.4 09/25/2022   Lab Results  Component Value Date   CHOL 151 08/24/2020   HDL 57 08/24/2020   LDLCALC 70 08/24/2020   TRIG 162 (A) 08/24/2020   CHOLHDL 2.1 06/07/2018    Lab Results  Component Value Date   HGBA1C 5.1 02/14/2019    Assessment & Plan    1.  ***  Rip Harbour, NP 10/06/2022, 7:08 PM

## 2022-10-07 ENCOUNTER — Non-Acute Institutional Stay (SKILLED_NURSING_FACILITY): Payer: Medicare Other | Admitting: Nurse Practitioner

## 2022-10-07 ENCOUNTER — Encounter: Payer: Self-pay | Admitting: Nurse Practitioner

## 2022-10-07 DIAGNOSIS — D519 Vitamin B12 deficiency anemia, unspecified: Secondary | ICD-10-CM

## 2022-10-07 DIAGNOSIS — E876 Hypokalemia: Secondary | ICD-10-CM | POA: Insufficient documentation

## 2022-10-07 DIAGNOSIS — I10 Essential (primary) hypertension: Secondary | ICD-10-CM

## 2022-10-07 DIAGNOSIS — R413 Other amnesia: Secondary | ICD-10-CM

## 2022-10-07 DIAGNOSIS — I5032 Chronic diastolic (congestive) heart failure: Secondary | ICD-10-CM | POA: Diagnosis not present

## 2022-10-07 DIAGNOSIS — K219 Gastro-esophageal reflux disease without esophagitis: Secondary | ICD-10-CM

## 2022-10-07 DIAGNOSIS — M159 Polyosteoarthritis, unspecified: Secondary | ICD-10-CM

## 2022-10-07 DIAGNOSIS — F339 Major depressive disorder, recurrent, unspecified: Secondary | ICD-10-CM

## 2022-10-07 DIAGNOSIS — E039 Hypothyroidism, unspecified: Secondary | ICD-10-CM

## 2022-10-07 DIAGNOSIS — M353 Polymyalgia rheumatica: Secondary | ICD-10-CM | POA: Diagnosis not present

## 2022-10-07 DIAGNOSIS — N318 Other neuromuscular dysfunction of bladder: Secondary | ICD-10-CM

## 2022-10-07 DIAGNOSIS — N1832 Chronic kidney disease, stage 3b: Secondary | ICD-10-CM

## 2022-10-07 DIAGNOSIS — I2510 Atherosclerotic heart disease of native coronary artery without angina pectoris: Secondary | ICD-10-CM

## 2022-10-07 NOTE — Assessment & Plan Note (Signed)
on Isosorbide and Ranolazine, Plavix. LDL 70 08/24/20 

## 2022-10-07 NOTE — Assessment & Plan Note (Signed)
takes Tylenol, Methocarbamol prn, 06/18/22 ortho X-ay R+L hip

## 2022-10-07 NOTE — Assessment & Plan Note (Signed)
stable, on Venlafaxine 

## 2022-10-07 NOTE — Assessment & Plan Note (Signed)
Blood pressure is controlled,  on Metoprolol, Losartan  

## 2022-10-07 NOTE — Assessment & Plan Note (Signed)
Urinary frequency and occasional incontinent of urine, on Myrbetriq. 

## 2022-10-07 NOTE — Assessment & Plan Note (Signed)
takes Prednisone 5mg  qd(failed GDR), followed by Dr. Kathi Ludwig, morphine prn for pain. Dc statin to eliminate a possible cause

## 2022-10-07 NOTE — Assessment & Plan Note (Signed)
on B12,  Vit B12 1133, Fe 72 08/25/22, FOBT positive in the past. Hgb 9.9 10/01/22

## 2022-10-07 NOTE — Assessment & Plan Note (Signed)
better on  Pantoprazole, reduce Reglan 5mg  every day/bid, and prn Maalox, 06/05/22 esophogram barium swallow study, dysmotility, normal lipase, Amylase, negative H-pylori 08/20/22 10/05/22 regular diet.

## 2022-10-07 NOTE — Assessment & Plan Note (Signed)
K 3.3 716/24, added Kcl po daily.

## 2022-10-07 NOTE — Assessment & Plan Note (Addendum)
O2 3lmp via [facility], SOB w/o finished a sentence, BNP 1357 10/06/22, taking Torsemide, will have Morphine prn for pain and SOB. EF 30-35% 09/22/22

## 2022-10-07 NOTE — Assessment & Plan Note (Signed)
Bun/creat 24/1.53 10/06/22

## 2022-10-07 NOTE — Progress Notes (Signed)
Location:   [facility] Room Number: 4 B Place of Service:  SNF (31) Provider:  Ilse Billman X, NP  Lacy Sofia X, NP  Patient Care Team: Thaily Hackworth X, NP as PCP - General (Internal Medicine) Corky Crafts, MD as PCP - Cardiology (Cardiology) Charlott Rakes, MD as Consulting Physician (Gastroenterology) August Saucer Corrie Mckusick, MD as Consulting Physician (Orthopedic Surgery) Levert Feinstein, MD as Consulting Physician (Neurology) Marcine Matar, MD as Consulting Physician (Urology) Donalee Citrin, MD as Consulting Physician (Neurosurgery)  Extended Emergency Contact Information Primary Emergency Contact: [facility] Phone: [phone] Relation: Son Secondary Emergency Contact: Caputo,MATTHEW Mobile Phone: [phone] Relation: Son  Code Status:  DNR Goals of care: Advanced Directive information    10/07/2022   11:18 AM  Advanced Directives  Does Patient Have a Medical Advance Directive? Yes  Type of Advance Directive Living will;Out of facility DNR (pink MOST or yellow form);Healthcare Power of Attorney  Does patient want to make changes to medical advance directive? No - Patient declined  Copy of Healthcare Power of Attorney in Chart? Yes - validated most recent copy scanned in chart (See row information)  Pre-existing out of facility DNR order (yellow form or pink MOST form) Pink MOST form placed in chart (order not valid for inpatient use);Yellow form placed in chart (order not valid for inpatient use)     Chief Complaint  Patient presents with   Medical Management of Chronic Issues   Immunizations    Tdap vaccine    HPI:  Pt is a 82 y.o. female seen today for K 3.3 716/24, muscle aches allover, SOB  CHF O2 3lmp via [place], SOB w/o finished a sentence, BNP 1357 10/06/22, taking Torsemide. EF 30-35 09/22/22             GERD, better on  Pantoprazole, reduce Reglan 5mg  every day/bid, and prn Maalox, 06/05/22 esophogram barium swallow study,  dysmotility, normal lipase, Amylase, negative H-pylori 08/20/22             PMR, takes Prednisone 5mg  qd(failed GDR), followed by Dr. Kathi Ludwig HTN, on Metoprolol, Losartan             Memory loss: MMSE  29/38 05/27/22, 07/02/21 CT head w/o CM, No acute abnormality no change from earlier this year. Moderate atrophy and moderate chronic microvascular ischemic change in the white matter. On Memantine.              Gait abnormality, uses walker for ambulation, w/c to go further.  CKD Bun/creat 24/1.53 10/06/22             CAD, on Isosorbide and Ranolazine, Plavix. LDL 70 08/24/20             Hypothyroidism, on Levothyroxine, TSH 3.03 12/04/21             Anemia, on B12,  Vit B12 1133, Fe 72 08/25/22, FOBT positive in the past. Hgb 9.9 10/01/22             Urinary frequency and occasional incontinent of urine, on Myrbetriq.             Depression, stable, on Venlafaxine             OA, takes Tylenol, Methocarbamol prn, 06/18/22 ortho X-ay R+L hip             OP, takes Ca, Vit D. 04/29/21 Dr. Kathi Ludwig dc Fosamax.  05/07/21 DEXA t score -1.273  Vit B12 deficiency, takes Vit B12, Vit B12 1133 08/25/22             Hyperlipidemia, off  Atorvastatin 2/2 of muscle aches allover             OSA, uses CPAP                Past Medical History:  Diagnosis Date   Anemia    Arthritis    "fingers, back, shoulders, hips" (04/10/2016)   Chronic diastolic CHF (congestive heart failure) (HCC)    a. normal EF, LVEDP at [facility] in 6/17 33 >> Lasix started    CKD (chronic kidney disease), stage III (HCC)    Coronary artery disease    a. s/p CABG 2000 (SVG/ free LIMA Y graft to the diagonal and distal LAD, SVG to the OM 1, and SVG to the PDA) . b. Cath 12/19/2014 90% dSVG to RCA s/p 2 overlapping DES. c. LHC 2016, 2017 no intervention except diuresis needed. d. Low risk nuc 03/2016. e. Cath 12/2017 patent LIMA-LAD, SVG-OM, SVG-PDA but occluded SVG to diag.    Depression    with anxious component   GERD (gastroesophageal reflux  disease)    History of hiatal hernia    Hyperlipidemia    Hypothyroidism    Obesity    OSA on CPAP    PMR (polymyalgia rheumatica) (HCC)    Pneumonia    "2-3 times" (04/10/2016)   Stable angina    microvascular, improved with Ranexa   Subclavian artery stenosis (HCC)    a. L by cath note in 2016.   TIA (transient ischemic attack)    Past Surgical History:  Procedure Laterality Date   BREAST BIOPSY Right 1964   CARDIAC CATHETERIZATION  08   patent grafts, no culprit lesions, EF 65%   CARDIAC CATHETERIZATION N/A 12/19/2014   Procedure: Left Heart Cath and Cors/Grafts Angiography;  Surgeon: Corky Crafts, MD;  Location: [facility] INVASIVE CV LAB;  Service: Cardiovascular;  Laterality: N/A;   CARDIAC CATHETERIZATION N/A 12/19/2014   Procedure: Coronary Stent Intervention;  Surgeon: Corky Crafts, MD;  Location: [facility] INVASIVE CV LAB;  Service: Cardiovascular;  Laterality: N/A;   CARDIAC CATHETERIZATION N/A 01/01/2015   Procedure: Left Heart Cath and Cors/Grafts Angiography;  Surgeon: Corky Crafts, MD;  Location: [facility] INVASIVE CV LAB;  Service: Cardiovascular;  Laterality: N/A;   CARDIAC CATHETERIZATION N/A 09/20/2015   Procedure: Left Heart Cath and Cors/Grafts Angiography;  Surgeon: Corky Crafts, MD;  Location: [facility] INVASIVE CV LAB;  Service: Cardiovascular;  Laterality: N/A;   CATARACT EXTRACTION W/ INTRAOCULAR LENS  IMPLANT, BILATERAL Bilateral 2011   COLONOSCOPY WITH PROPOFOL N/A 07/27/2016   Procedure: COLONOSCOPY WITH PROPOFOL;  Surgeon: Charlott Rakes, MD;  Location: [facility] ENDOSCOPY;  Service: Endoscopy;  Laterality: N/A;   CORONARY ARTERY BYPASS GRAFT  2000   ASCVD, multivessel, S./P.   DILATION AND CURETTAGE OF UTERUS  1980s   ESOPHAGOGASTRODUODENOSCOPY (EGD) WITH PROPOFOL N/A 07/27/2016   Procedure: ESOPHAGOGASTRODUODENOSCOPY (EGD) WITH PROPOFOL;  Surgeon: Charlott Rakes, MD;  Location: [facility] ENDOSCOPY;  Service: Endoscopy;  Laterality: N/A;   FRACTURE SURGERY     LEFT  HEART CATH AND CORS/GRAFTS ANGIOGRAPHY N/A 01/12/2018   Procedure: LEFT HEART CATH AND CORS/GRAFTS ANGIOGRAPHY;  Surgeon: Corky Crafts, MD;  Location: [facility] INVASIVE CV LAB;  Service: Cardiovascular;  Laterality: N/A;   PATELLA FRACTURE SURGERY Left 1993    Allergies  Allergen Reactions   Nsaids Other (See Comments)    Due  to chronic kidney failure   Butorphanol Other (See Comments)    agitation Constipation Produced a lot of urine, made patient feel crazy   Sulfa Antibiotics Rash   Bisoprolol Fumarate Other (See Comments)    Doesn't remember   Butorphanol Tartrate Other (See Comments)    Produced a lot of urine, made patient feel crazy   Codeine Nausea And Vomiting   Demerol [Meperidine] Nausea And Vomiting   Imipramine     Sweating, facial dysfunction    Meperidine And Related Nausea And Vomiting   Statins     MYALGIAS   Tequin [Gatifloxacin] Other (See Comments)    Caused hypoglycemia Low blood sugar   Brilinta [Ticagrelor] Rash    CAUSES PETECHIAE, PURPURA   Pseudoephedrine Hcl Palpitations   Septra [Sulfamethoxazole-Trimethoprim] Rash   Sulfamethoxazole-Trimethoprim Rash    Allergies as of 10/07/2022       Reactions   Nsaids Other (See Comments)   Due to chronic kidney failure   Butorphanol Other (See Comments)   agitation Constipation Produced a lot of urine, made patient feel crazy   Sulfa Antibiotics Rash   Bisoprolol Fumarate Other (See Comments)   Doesn't remember   Butorphanol Tartrate Other (See Comments)   Produced a lot of urine, made patient feel crazy   Codeine Nausea And Vomiting   Demerol [meperidine] Nausea And Vomiting   Imipramine    Sweating, facial dysfunction    Meperidine And Related Nausea And Vomiting   Statins    MYALGIAS   Tequin [gatifloxacin] Other (See Comments)   Caused hypoglycemia Low blood sugar   Brilinta [ticagrelor] Rash   CAUSES PETECHIAE, PURPURA   Pseudoephedrine Hcl Palpitations   Septra  [sulfamethoxazole-trimethoprim] Rash   Sulfamethoxazole-trimethoprim Rash        Medication List        Accurate as of October 07, 2022  3:37 PM. If you have any questions, ask your nurse or doctor.          STOP taking these medications    levocetirizine 5 MG tablet Commonly known as: XYZAL Stopped by: Ovetta Bazzano X Jalynn Waddell   Vitamin D 50 MCG (2000 UT) tablet Stopped by: Brandilee Pies X Shifra Swartzentruber       TAKE these medications    acetaminophen 500 MG tablet Commonly known as: TYLENOL Take 1,000 mg by mouth 2 (two) times daily as needed for mild pain.   alum & mag hydroxide-simeth 400-400-40 MG/5ML suspension Commonly known as: MAALOX PLUS Take 10 mLs by mouth as needed for indigestion.   Biofreeze 10 % Liqd Generic drug: Menthol (Topical Analgesic) Apply 1 Application topically as needed (pain).   budesonide 0.25 MG/2ML nebulizer solution Commonly known as: PULMICORT Take 2 mLs (0.25 mg total) by nebulization 2 (two) times daily.   clopidogrel 75 MG tablet Commonly known as: PLAVIX TAKE 1 TABLET ONCE DAILY.   COENZYME Q-10 PO Take 10 mg by mouth at bedtime.   cyanocobalamin 1000 MCG tablet Take 1,000 mcg by mouth daily.   docusate sodium 100 MG capsule Commonly known as: COLACE Take 100 mg by mouth daily as needed for mild constipation.   fluticasone 50 MCG/ACT nasal spray Commonly known as: FLONASE Place 1 spray into both nostrils daily as needed for allergies.   furosemide 40 MG tablet Commonly known as: LASIX Take 40 mg by mouth daily. What changed: Another medication with the same name was removed. Continue taking this medication, and follow the directions you see here. Changed by: Natina Wiginton X Thanh Mottern  ipratropium-albuterol 0.5-2.5 (3) MG/3ML Soln Commonly known as: DUONEB Take 3 mLs by nebulization 2 (two) times daily.   ketoconazole 2 % shampoo Commonly known as: NIZORAL Apply 1 Application topically 2 (two) times a week. Tuesday and Friday   levothyroxine 75 MCG  tablet Commonly known as: SYNTHROID TAKE 1 TABLET ONCE DAILY ON EMPTY STOMACH.   melatonin 1 MG Tabs tablet Take 1 mg by mouth at bedtime.   memantine 10 MG tablet Commonly known as: NAMENDA Take 10 mg by mouth at bedtime.   metoCLOPramide 5 MG tablet Commonly known as: REGLAN Take 5 mg by mouth in the morning and at bedtime.   metoprolol succinate 25 MG 24 hr tablet Commonly known as: TOPROL-XL Take 25 mg by mouth daily. Take with or immediately following a meal.   miconazole 2 % powder Commonly known as: MICOTIN Apply topically at bedtime.  Apply to groin and pannus topically   morphine 20 MG/5ML solution Take 0.25 mLs by mouth every 4 (four) hours as needed for pain. And SOB   Myrbetriq 50 MG Tb24 tablet Generic drug: mirabegron ER TAKE 1 TABLET BY MOUTH DAILY.   nitroGLYCERIN 0.4 MG SL tablet Commonly known as: NITROSTAT Place 0.4 mg under the tongue every 5 (five) minutes as needed for chest pain. X 3 doses   pantoprazole 40 MG tablet Commonly known as: PROTONIX Take 1 tablet (40 mg total) by mouth 2 (two) times daily.   predniSONE 5 MG tablet Commonly known as: DELTASONE Take 5 mg by mouth daily.   PSYLLIUM HUSK PO Take 0.4 g by mouth daily. May keep at bedside   venlafaxine 75 MG tablet Commonly known as: EFFEXOR Take 150 mg by mouth daily.   zinc oxide 20 % ointment Apply 1 application topically as needed for irritation.        Review of Systems  Constitutional:  Positive for activity change, appetite change and fatigue. Negative for fever.  HENT:  Positive for hearing loss. Negative for congestion and voice change.   Eyes:  Negative for visual disturbance.  Respiratory:  Positive for shortness of breath. Negative for cough, chest tightness and wheezing.   Cardiovascular:  Negative for palpitations and leg swelling.  Gastrointestinal:  Negative for abdominal pain and constipation.  Genitourinary:  Negative for dysuria, frequency and urgency.        1-2x/night bathroom trips.   Musculoskeletal:  Positive for arthralgias, gait problem and myalgias.       Right lateral thigh about the patient's hand sized area pain palpated, no skin breakdown, deformity, skin redness or warmth, s/s of trauma or infection  Skin:  Negative for color change.  Neurological:  Negative for speech difficulty, weakness and headaches.       Memory lapses.   Psychiatric/Behavioral:  Negative for behavioral problems, hallucinations and sleep disturbance. The patient is not nervous/anxious.     Immunization History  Administered Date(s) Administered   Fluad Quad(high Dose 65+) 01/12/2022   HPV Bivalent 11/24/2013, 01/17/2016   Influenza Whole 12/23/2017   Influenza, High Dose Seasonal PF 12/24/2018, 12/24/2018   Influenza-Unspecified 01/10/2013, 12/27/2014, 12/30/2016, 01/02/2020, 01/09/2021   Moderna SARS-COV2 Booster Vaccination 08/08/2021   Moderna Sars-Covid-2 Vaccination 03/25/2019, 04/22/2019, 01/30/2020, 08/20/2020, 12/10/2020   Pfizer Covid-19 Vaccine Bivalent Booster 65yrs & up 01/22/2022   Pneumococcal Conjugate-13 04/21/2013   Pneumococcal Polysaccharide-23 02/27/1993, 03/23/2009   RSV,unspecified 05/25/2022   Tdap 03/23/2012, 04/20/2012   Zoster Recombinant(Shingrix) 03/29/2021, 06/09/2021   Zoster, Live 11/10/2010   Pertinent  Health Maintenance  Due  Topic Date Due   INFLUENZA VACCINE  10/22/2022   DEXA SCAN  Completed      03/31/2022   10:18 AM 04/03/2022    2:08 PM 07/13/2022    9:50 AM 08/10/2022   11:01 AM 09/09/2022    8:58 AM  Fall Risk  Falls in the past year? 0 0 0 0 0  Was there an injury with Fall? 0 0 0 0 0  Fall Risk Category Calculator 0 0 0 0 0  Fall Risk Category (Retired) Low Low     (RETIRED) Patient Fall Risk Level Low fall risk Low fall risk     Patient at Risk for Falls Due to No Fall Risks No Fall Risks  No Fall Risks No Fall Risks  Fall risk Follow up Falls evaluation completed Falls evaluation completed Falls  evaluation completed Falls evaluation completed Falls evaluation completed   Functional Status Survey:    Vitals:   10/07/22 1113  BP: (!) 121/56  Pulse: 100  Resp: (!) 22  Temp: 97.6 F (36.4 C)  SpO2: 93%  Weight: 184 lb 9.6 oz (83.7 kg)  Height: 4\' 10"  (1.473 m)   Body mass index is 38.58 kg/m. Physical Exam Vitals and nursing note reviewed.  Constitutional:      Comments: Exhausted, difficulty finishing a sentence  HENT:     Head: Normocephalic and atraumatic.     Mouth/Throat:     Mouth: Mucous membranes are moist.  Eyes:     Extraocular Movements: Extraocular movements intact.     Conjunctiva/sclera: Conjunctivae normal.     Pupils: Pupils are equal, round, and reactive to light.  Cardiovascular:     Rate and Rhythm: Normal rate and regular rhythm.     Heart sounds:     No gallop.  Pulmonary:     Breath sounds: No rales.     Comments: O2 3lpm via [place] to maintain SatO2>90%, SOB when speaking Abdominal:     General: Bowel sounds are normal.     Palpations: Abdomen is soft.     Tenderness: There is no abdominal tenderness.  Musculoskeletal:        General: No swelling, tenderness or signs of injury.     Cervical back: Normal range of motion and neck supple.     Right lower leg: No edema.     Left lower leg: No edema.     Comments: Muscle aches allover   Skin:    General: Skin is warm and dry.  Neurological:     General: No focal deficit present.     Mental Status: She is alert and oriented to person, place, and time. Mental status is at baseline.     Gait: Gait abnormal.  Psychiatric:        Mood and Affect: Mood normal.        Behavior: Behavior normal.        Thought Content: Thought content normal.        Judgment: Judgment normal.     Labs reviewed: Recent Labs    09/27/22 0343 09/28/22 0659 09/29/22 0746 09/30/22 0428 10/01/22 0402 10/02/22 0338 10/06/22 0000  NA 139 134* 135 132* 131* 135 138  K 3.7 3.9 3.7 4.2 3.9 4.0 3.3*  CL 102 97*  96* 95* 97* 98 98*  CO2 25 26 27 25 24 28  28*  GLUCOSE 126* 130* 107* 108* 90 88  --   BUN 38* 45* 60* 70* 58* 53* 24*  CREATININE 1.23*  1.31* 1.56* 2.14* 1.53* 1.50* 1.5*  CALCIUM 9.3 9.7 9.6 9.4 8.8* 9.1 7.7*  MG 2.0 1.6* 2.2  --   --   --   --    Recent Labs    09/21/22 0423 09/24/22 0305 09/25/22 0339  AST 15 15 15   ALT 13 12 12   ALKPHOS 37* 42 43  BILITOT 0.5 0.5 0.4  PROT 6.1* 5.8* 5.9*  ALBUMIN 3.0* 2.7* 2.7*   Recent Labs    12/05/21 0000 09/21/22 0423 09/22/22 0744 09/26/22 0338 09/30/22 0428 10/01/22 0854  WBC 12.2 16.0*   < > 15.9* 20.0* 15.9*  NEUTROABS 8,747.00 12.9*  --   --   --   --   HGB 10.3* 9.0*   < > 9.5* 10.3* 9.9*  HCT 32* 29.1*   < > 30.5* 33.9* 31.7*  MCV  --  105.1*   < > 104.5* 104.3* 101.6*  PLT 268 310   < > 319 363 338   < > = values in this interval not displayed.   Lab Results  Component Value Date   TSH 3.56 04/01/2021   Lab Results  Component Value Date   HGBA1C 5.1 02/14/2019   Lab Results  Component Value Date   CHOL 151 08/24/2020   HDL 57 08/24/2020   LDLCALC 70 08/24/2020   TRIG 162 (A) 08/24/2020   CHOLHDL 2.1 06/07/2018    Significant Diagnostic Results in last 30 days:  US RENAL  Result Date: 09/30/2022 CLINICAL DATA:  [id number] AKI (acute kidney injury) (HCC) [id number] EXAM: RENAL / URINARY TRACT ULTRASOUND COMPLETE COMPARISON:  April 17, 2015 FINDINGS: Right Kidney: Renal measurements: 10.0 x 3.9 x 4.5 cm = volume: 90 mL. Echogenicity within normal limits. No mass or hydronephrosis visualized. Cortical thinning, mild. Left Kidney: Renal measurements: 10.1 x 5.1 x 3.2 cm = volume: 77 mL. Echogenicity within normal limits. No suspicious mass or hydronephrosis visualized. Exophytic cyst measuring up to 19 mm (for which no dedicated imaging follow-up is recommended). Mild cortical thinning. Bladder: Appears normal for degree of bladder distention. Other: None. IMPRESSION: No hydronephrosis. Electronically Signed   By:  Meda Klinefelter M.D.   On: 09/30/2022 20:15   DG CHEST PORT 1 VIEW  Result Date: 09/27/2022 CLINICAL DATA:  Dyspnea. EXAM: PORTABLE CHEST 1 VIEW COMPARISON:  09/21/2022. FINDINGS: Stable changes from prior CABG surgery. No mediastinal or hilar masses. Bilateral interstitial and hazy airspace lung opacities are similar to the prior exam. Suspect small effusions. No pneumothorax. Skeletal structures are grossly intact. IMPRESSION: 1. Findings consistent with CHF with pulmonary edema, similar to the exam from 09/21/2022. Electronically Signed   By: Amie Portland M.D.   On: 09/27/2022 11:43   ECHOCARDIOGRAM COMPLETE  Result Date: 09/22/2022    ECHOCARDIOGRAM REPORT   Patient Name:   MARSHA GUNDLACH Date of Exam: 09/22/2022 Medical Rec #:  [id number]        Height:       58.0 in Accession #:    [id number]       Weight:       188.5 lb Date of Birth:  Nov 03, 1940        BSA:          1.776 m Patient Age:    82 years         BP:           128/57 mmHg Patient Gender: F                HR:  52 bpm. Exam Location:  Inpatient Procedure: 2D Echo, Cardiac Doppler, Color Doppler and Intracardiac            Opacification Agent Indications:     CHF - Acute Diastolic  History:         Patient has prior history of Echocardiogram examinations, most                  recent 07/18/2018. CHF, CAD, Prior CABG, PAD, CKD and TIA,                  Signs/Symptoms:elevated troponin, Edema and Dyspnea; Risk                  Factors:Hypertension, Dyslipidemia, Sleep Apnea and Former                  Smoker.  Sonographer:     Wallie Char Referring Phys:  [id number] DAVID MANUEL ORTIZ Diagnosing Phys: Charlton Haws MD IMPRESSIONS  1. Global hypokinesis worse in septum and apex. Left ventricular ejection fraction, by estimation, is 30 to 35%. The left ventricle has moderately decreased function. The left ventricle demonstrates regional wall motion abnormalities (see scoring diagram/findings for description). The left ventricular internal  cavity size was moderately dilated. Left ventricular diastolic parameters are indeterminate.  2. Right ventricular systolic function is normal. The right ventricular size is normal.  3. The mitral valve is degenerative. No evidence of mitral valve regurgitation. No evidence of mitral stenosis. Severe mitral annular calcification.  4. The aortic valve is tricuspid. There is moderate calcification of the aortic valve. There is moderate thickening of the aortic valve. Aortic valve regurgitation is not visualized. Mild aortic valve stenosis.  5. The inferior vena cava is normal in size with greater than 50% respiratory variability, suggesting right atrial pressure of 3 mmHg. FINDINGS  Left Ventricle: Global hypokinesis worse in septum and apex. Left ventricular ejection fraction, by estimation, is 30 to 35%. The left ventricle has moderately decreased function. The left ventricle demonstrates regional wall motion abnormalities. Definity contrast agent was given IV to delineate the left ventricular endocardial borders. The left ventricular internal cavity size was moderately dilated. There is no left ventricular hypertrophy. Left ventricular diastolic parameters are indeterminate. Right Ventricle: The right ventricular size is normal. No increase in right ventricular wall thickness. Right ventricular systolic function is normal. Left Atrium: Left atrial size was normal in size. Right Atrium: Right atrial size was normal in size. Pericardium: There is no evidence of pericardial effusion. Mitral Valve: The mitral valve is degenerative in appearance. There is mild thickening of the mitral valve leaflet(s). There is mild calcification of the mitral valve leaflet(s). Severe mitral annular calcification. No evidence of mitral valve regurgitation. No evidence of mitral valve stenosis. MV peak gradient, 7.2 mmHg. The mean mitral valve gradient is 4.0 mmHg. Tricuspid Valve: The tricuspid valve is normal in structure. Tricuspid  valve regurgitation is mild . No evidence of tricuspid stenosis. Aortic Valve: The aortic valve is tricuspid. There is moderate calcification of the aortic valve. There is moderate thickening of the aortic valve. Aortic valve regurgitation is not visualized. Mild aortic stenosis is present. Aortic valve mean gradient measures 5.0 mmHg. Aortic valve peak gradient measures 8.0 mmHg. Aortic valve area, by VTI measures 1.73 cm. Pulmonic Valve: The pulmonic valve was normal in structure. Pulmonic valve regurgitation is trivial. No evidence of pulmonic stenosis. Aorta: The aortic root is normal in size and structure. Venous: The inferior vena cava is normal  in size with greater than 50% respiratory variability, suggesting right atrial pressure of 3 mmHg. IAS/Shunts: No atrial level shunt detected by color flow Doppler.  LEFT VENTRICLE PLAX 2D LVIDd:         4.60 cm      Diastology LVIDs:         3.50 cm      LV e' medial:    4.10 cm/s LV PW:         0.90 cm      LV E/e' medial:  30.5 LV IVS:        0.80 cm      LV e' lateral:   6.19 cm/s LVOT diam:     1.70 cm      LV E/e' lateral: 20.2 LV SV:         48 LV SV Index:   27 LVOT Area:     2.27 cm  LV Volumes (MOD) LV vol d, MOD A2C: 126.0 ml LV vol d, MOD A4C: 126.0 ml LV vol s, MOD A2C: 86.1 ml LV vol s, MOD A4C: 87.1 ml LV SV MOD A2C:     39.9 ml LV SV MOD A4C:     126.0 ml LV SV MOD BP:      40.1 ml RIGHT VENTRICLE             IVC RV Basal diam:  3.50 cm     IVC diam: 1.90 cm RV S prime:     10.10 cm/s TAPSE (M-mode): 1.0 cm LEFT ATRIUM             Index        RIGHT ATRIUM           Index LA diam:        3.20 cm 1.80 cm/m   RA Area:     16.20 cm LA Vol (A2C):   59.9 ml 33.73 ml/m  RA Volume:   43.40 ml  24.44 ml/m LA Vol (A4C):   63.9 ml 35.98 ml/m LA Biplane Vol: 61.9 ml 34.86 ml/m  AORTIC VALVE AV Area (Vmax):    1.69 cm AV Area (Vmean):   1.56 cm AV Area (VTI):     1.73 cm AV Vmax:           141.50 cm/s AV Vmean:          103.000 cm/s AV VTI:             0.278 m AV Peak Grad:      8.0 mmHg AV Mean Grad:      5.0 mmHg LVOT Vmax:         105.40 cm/s LVOT Vmean:        70.850 cm/s LVOT VTI:          0.212 m LVOT/AV VTI ratio: 0.76  AORTA Ao Root diam: 2.70 cm Ao Asc diam:  3.20 cm MITRAL VALVE                TRICUSPID VALVE MV Area (PHT): 4.68 cm     TR Peak grad:   56.0 mmHg MV Area VTI:   1.65 cm     TR Vmax:        374.00 cm/s MV Peak grad:  7.2 mmHg MV Mean grad:  4.0 mmHg     SHUNTS MV Vmax:       1.34 m/s     Systemic VTI:  0.21 m MV Vmean:      87.3  cm/s    Systemic Diam: 1.70 cm MV Decel Time: 162 msec MV E velocity: 125.00 cm/s MV A velocity: 133.00 cm/s MV E/A ratio:  0.94 Charlton Haws MD Electronically signed by Charlton Haws MD Signature Date/Time: 09/22/2022/11:24:09 AM    Final (Updated)    DG Chest Portable 1 View  Result Date: 09/21/2022 CLINICAL DATA:  Dyspnea and shortness of breath. 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 Thoracic aorta is heavily calcified with stable mediastinal borders. There is increased central vascular engorgement and moderate perihilar interstitial and ground-glass edema. Interstitial edema continues into the bases where there are small pleural effusions forming, as well as patchy opacities which may represent additional edema or superimposed pneumonia. There are degenerative changes and slight dextroscoliosis thoracic spine. IMPRESSION: 1. CHF or fluid overload with perihilar interstitial and ground-glass edema. 2. Developing small pleural effusions. 3. Patchy opacities in the bases which may be due to additional edema or superimposed pneumonia. Electronically Signed   By: Almira Bar M.D.   On: 09/21/2022 05:53    Assessment/Plan Hypokalemia K 3.3 716/24, added Kcl po daily.   (HFpEF) heart failure with preserved ejection fraction (HCC) O2 3lmp via [place], SOB w/o finished a sentence, BNP 1357 10/06/22, taking Torsemide, will have Morphine  prn for pain and SOB. EF 30-35% 09/22/22  Polymyalgia rheumatica (HCC) takes Prednisone 5mg  qd(failed GDR), followed by Dr. Kathi Ludwig, morphine prn for pain. Dc statin to eliminate a possible cause  GERD (gastroesophageal reflux disease) better on  Pantoprazole, reduce Reglan 5mg  every day/bid, and prn Maalox, 06/05/22 esophogram barium swallow study, dysmotility, normal lipase, Amylase, negative H-pylori 08/20/22 10/05/22 regular diet.   Essential hypertension Blood pressure is controlled, on Metoprolol, Losartan  Memory loss  MMSE  29/38 05/27/22, 07/02/21 CT head w/o CM, No acute abnormality no change from earlier this year. Moderate atrophy and moderate chronic microvascular ischemic change in the white matter. On Memantine.   CKD (chronic kidney disease) stage 3, GFR 30-59 ml/min (HCC)  Bun/creat 24/1.53 10/06/22  CAD (coronary artery disease) on Isosorbide and Ranolazine, Plavix. LDL 70 08/24/20  Hypothyroidism  on Levothyroxine, TSH 3.03 12/04/21, update TSH, CBC/diff, CMP/eGFR  Vitamin B12 deficiency anemia  on B12,  Vit B12 1133, Fe 72 08/25/22, FOBT positive in the past. Hgb 9.9 10/01/22  Hypertonicity of bladder  Urinary frequency and occasional incontinent of urine, on Myrbetriq.  Depression, recurrent (HCC) stable, on Venlafaxine  Generalized osteoarthritis of multiple sites  takes Tylenol, Methocarbamol prn, 06/18/22 ortho X-ay R+L hip     Family/ staff Communication: plan of care reviewed with the patient and charge nurse.   Labs/tests ordered:  CBC/diff, CMP/eGFR, TSH one week.   Time spend 35 minutes.

## 2022-10-07 NOTE — Assessment & Plan Note (Signed)
MMSE  29/38 05/27/22, 07/02/21 CT head w/o CM, No acute abnormality no change from earlier this year. Moderate atrophy and moderate chronic microvascular ischemic change in the white matter. On Memantine.

## 2022-10-07 NOTE — Assessment & Plan Note (Signed)
on Levothyroxine, TSH 3.03 12/04/21, update TSH, CBC/diff, CMP/eGFR

## 2022-10-08 ENCOUNTER — Ambulatory Visit: Payer: Medicare Other | Admitting: Physician Assistant

## 2022-10-09 ENCOUNTER — Encounter: Payer: Self-pay | Admitting: Nurse Practitioner

## 2022-10-22 NOTE — Progress Notes (Signed)
This encounter was created in error - please disregard.

## 2022-10-22 DEATH — deceased

## 2023-01-06 ENCOUNTER — Ambulatory Visit: Payer: Medicare Other | Admitting: Interventional Cardiology
